# Patient Record
Sex: Female | Born: 1950 | Race: White | Hispanic: No | Marital: Married | Smoking: Current every day smoker
Health system: Southern US, Community
[De-identification: ages and names within clinical notes are randomized; demographics above are authoritative.]

## PROBLEM LIST (undated history)

## (undated) DIAGNOSIS — J449 Chronic obstructive pulmonary disease, unspecified: Secondary | ICD-10-CM

## (undated) DIAGNOSIS — C801 Malignant (primary) neoplasm, unspecified: Secondary | ICD-10-CM

## (undated) DIAGNOSIS — F32A Depression, unspecified: Secondary | ICD-10-CM

## (undated) DIAGNOSIS — G629 Polyneuropathy, unspecified: Secondary | ICD-10-CM

## (undated) DIAGNOSIS — J45909 Unspecified asthma, uncomplicated: Secondary | ICD-10-CM

## (undated) DIAGNOSIS — E119 Type 2 diabetes mellitus without complications: Secondary | ICD-10-CM

## (undated) DIAGNOSIS — I10 Essential (primary) hypertension: Secondary | ICD-10-CM

## (undated) DIAGNOSIS — C3490 Malignant neoplasm of unspecified part of unspecified bronchus or lung: Secondary | ICD-10-CM

## (undated) DIAGNOSIS — I2510 Atherosclerotic heart disease of native coronary artery without angina pectoris: Secondary | ICD-10-CM

## (undated) DIAGNOSIS — G2581 Restless legs syndrome: Secondary | ICD-10-CM

## (undated) DIAGNOSIS — E785 Hyperlipidemia, unspecified: Secondary | ICD-10-CM

## (undated) DIAGNOSIS — K219 Gastro-esophageal reflux disease without esophagitis: Secondary | ICD-10-CM

## (undated) DIAGNOSIS — F419 Anxiety disorder, unspecified: Secondary | ICD-10-CM

## (undated) DIAGNOSIS — R51 Headache: Secondary | ICD-10-CM

## (undated) DIAGNOSIS — R011 Cardiac murmur, unspecified: Secondary | ICD-10-CM

## (undated) DIAGNOSIS — R519 Headache, unspecified: Secondary | ICD-10-CM

## (undated) DIAGNOSIS — F329 Major depressive disorder, single episode, unspecified: Secondary | ICD-10-CM

## (undated) HISTORY — PX: DILATION AND CURETTAGE OF UTERUS: SHX78

## (undated) HISTORY — DX: Type 2 diabetes mellitus without complications: E11.9

## (undated) HISTORY — DX: Anxiety disorder, unspecified: F41.9

## (undated) HISTORY — PX: OTHER SURGICAL HISTORY: SHX169

## (undated) HISTORY — DX: Malignant neoplasm of unspecified part of unspecified bronchus or lung: C34.90

## (undated) HISTORY — PX: TUBAL LIGATION: SHX77

## (undated) HISTORY — DX: Headache: R51

## (undated) HISTORY — DX: Essential (primary) hypertension: I10

## (undated) HISTORY — DX: Major depressive disorder, single episode, unspecified: F32.9

## (undated) HISTORY — PX: CHOLECYSTECTOMY: SHX55

## (undated) HISTORY — DX: Chronic obstructive pulmonary disease, unspecified: J44.9

## (undated) HISTORY — DX: Unspecified asthma, uncomplicated: J45.909

## (undated) HISTORY — DX: Depression, unspecified: F32.A

## (undated) HISTORY — PX: THORACIC DISC SURGERY: SHX801

## (undated) HISTORY — PX: FOOT SURGERY: SHX648

## (undated) HISTORY — DX: Headache, unspecified: R51.9

---

## 1998-12-04 ENCOUNTER — Encounter: Payer: Self-pay | Admitting: Neurosurgery

## 1998-12-04 ENCOUNTER — Ambulatory Visit (HOSPITAL_COMMUNITY): Admission: RE | Admit: 1998-12-04 | Discharge: 1998-12-04 | Payer: Self-pay | Admitting: Neurosurgery

## 2002-03-12 ENCOUNTER — Encounter: Payer: Self-pay | Admitting: Neurosurgery

## 2002-03-17 ENCOUNTER — Encounter: Payer: Self-pay | Admitting: Neurosurgery

## 2002-03-17 ENCOUNTER — Ambulatory Visit (HOSPITAL_COMMUNITY): Admission: RE | Admit: 2002-03-17 | Discharge: 2002-03-18 | Payer: Self-pay | Admitting: Neurosurgery

## 2002-06-17 ENCOUNTER — Encounter: Payer: Self-pay | Admitting: Neurosurgery

## 2002-06-17 ENCOUNTER — Encounter: Admission: RE | Admit: 2002-06-17 | Discharge: 2002-06-17 | Payer: Self-pay | Admitting: Neurosurgery

## 2002-06-24 ENCOUNTER — Encounter: Payer: Self-pay | Admitting: Neurosurgery

## 2002-06-24 ENCOUNTER — Ambulatory Visit (HOSPITAL_COMMUNITY): Admission: RE | Admit: 2002-06-24 | Discharge: 2002-06-24 | Payer: Self-pay | Admitting: Neurosurgery

## 2003-10-16 HISTORY — PX: BACK SURGERY: SHX140

## 2005-08-06 ENCOUNTER — Ambulatory Visit: Payer: Self-pay | Admitting: Obstetrics and Gynecology

## 2006-01-23 ENCOUNTER — Ambulatory Visit: Payer: Self-pay | Admitting: Unknown Physician Specialty

## 2006-03-17 ENCOUNTER — Other Ambulatory Visit: Payer: Self-pay

## 2006-03-17 ENCOUNTER — Emergency Department: Payer: Self-pay | Admitting: Emergency Medicine

## 2006-04-15 ENCOUNTER — Emergency Department: Payer: Self-pay | Admitting: Emergency Medicine

## 2006-05-16 ENCOUNTER — Ambulatory Visit: Payer: Self-pay | Admitting: Unknown Physician Specialty

## 2006-05-17 ENCOUNTER — Ambulatory Visit: Payer: Self-pay | Admitting: Unknown Physician Specialty

## 2006-12-12 ENCOUNTER — Ambulatory Visit: Payer: Self-pay | Admitting: Anesthesiology

## 2007-01-15 ENCOUNTER — Ambulatory Visit: Payer: Self-pay | Admitting: Anesthesiology

## 2007-01-28 ENCOUNTER — Ambulatory Visit: Payer: Self-pay | Admitting: Unknown Physician Specialty

## 2007-03-04 ENCOUNTER — Ambulatory Visit: Payer: Self-pay | Admitting: Anesthesiology

## 2007-05-07 ENCOUNTER — Ambulatory Visit: Payer: Self-pay | Admitting: Anesthesiology

## 2007-07-02 ENCOUNTER — Ambulatory Visit: Payer: Self-pay | Admitting: Anesthesiology

## 2007-08-13 ENCOUNTER — Ambulatory Visit: Payer: Self-pay | Admitting: Anesthesiology

## 2007-08-28 ENCOUNTER — Ambulatory Visit: Payer: Self-pay | Admitting: Anesthesiology

## 2007-11-05 ENCOUNTER — Ambulatory Visit: Payer: Self-pay | Admitting: Anesthesiology

## 2007-12-10 ENCOUNTER — Ambulatory Visit: Payer: Self-pay | Admitting: Anesthesiology

## 2008-01-01 ENCOUNTER — Ambulatory Visit: Payer: Self-pay | Admitting: Anesthesiology

## 2008-02-03 ENCOUNTER — Ambulatory Visit: Payer: Self-pay | Admitting: Unknown Physician Specialty

## 2008-06-01 ENCOUNTER — Ambulatory Visit: Payer: Self-pay | Admitting: Specialist

## 2008-06-08 ENCOUNTER — Ambulatory Visit: Payer: Self-pay | Admitting: Unknown Physician Specialty

## 2008-07-06 ENCOUNTER — Ambulatory Visit: Payer: Self-pay | Admitting: Anesthesiology

## 2008-08-24 ENCOUNTER — Ambulatory Visit: Payer: Self-pay | Admitting: Anesthesiology

## 2008-10-04 ENCOUNTER — Ambulatory Visit: Payer: Self-pay | Admitting: Anesthesiology

## 2009-01-12 ENCOUNTER — Ambulatory Visit: Payer: Self-pay | Admitting: Unknown Physician Specialty

## 2009-01-22 ENCOUNTER — Emergency Department: Payer: Self-pay | Admitting: Unknown Physician Specialty

## 2009-01-30 ENCOUNTER — Inpatient Hospital Stay: Payer: Self-pay | Admitting: Internal Medicine

## 2009-03-28 ENCOUNTER — Ambulatory Visit: Payer: Self-pay | Admitting: Unknown Physician Specialty

## 2009-10-19 ENCOUNTER — Ambulatory Visit: Payer: Self-pay | Admitting: Unknown Physician Specialty

## 2010-07-24 ENCOUNTER — Other Ambulatory Visit: Payer: Self-pay | Admitting: Physician Assistant

## 2011-01-08 ENCOUNTER — Ambulatory Visit: Payer: Self-pay | Admitting: Unknown Physician Specialty

## 2011-02-15 ENCOUNTER — Ambulatory Visit: Payer: Self-pay | Admitting: Gastroenterology

## 2012-01-15 ENCOUNTER — Ambulatory Visit: Payer: Self-pay | Admitting: Unknown Physician Specialty

## 2012-08-01 ENCOUNTER — Ambulatory Visit: Payer: Self-pay | Admitting: Specialist

## 2013-03-13 ENCOUNTER — Ambulatory Visit: Payer: Self-pay | Admitting: Unknown Physician Specialty

## 2013-10-15 HISTORY — PX: CARDIAC CATHETERIZATION: SHX172

## 2014-03-18 ENCOUNTER — Ambulatory Visit: Payer: Self-pay | Admitting: Unknown Physician Specialty

## 2014-05-31 DIAGNOSIS — K219 Gastro-esophageal reflux disease without esophagitis: Secondary | ICD-10-CM | POA: Insufficient documentation

## 2014-06-08 DIAGNOSIS — J449 Chronic obstructive pulmonary disease, unspecified: Secondary | ICD-10-CM | POA: Insufficient documentation

## 2014-06-25 DIAGNOSIS — R0602 Shortness of breath: Secondary | ICD-10-CM | POA: Insufficient documentation

## 2014-06-25 DIAGNOSIS — R079 Chest pain, unspecified: Secondary | ICD-10-CM | POA: Insufficient documentation

## 2014-07-16 ENCOUNTER — Ambulatory Visit: Payer: Self-pay | Admitting: Internal Medicine

## 2014-07-16 LAB — CK TOTAL AND CKMB (NOT AT ARMC)
CK, TOTAL: 49 U/L
CK-MB: 0.9 ng/mL (ref 0.5–3.6)

## 2014-07-17 LAB — BASIC METABOLIC PANEL
ANION GAP: 5 — AB (ref 7–16)
BUN: 15 mg/dL (ref 7–18)
CALCIUM: 8.4 mg/dL — AB (ref 8.5–10.1)
Chloride: 103 mmol/L (ref 98–107)
Co2: 29 mmol/L (ref 21–32)
Creatinine: 0.91 mg/dL (ref 0.60–1.30)
EGFR (African American): >60
GLUCOSE: 78 mg/dL (ref 65–99)
OSMOLALITY: 274 (ref 275–301)
POTASSIUM: 4.2 mmol/L (ref 3.5–5.1)
SODIUM: 137 mmol/L (ref 136–145)

## 2014-08-02 DIAGNOSIS — I2510 Atherosclerotic heart disease of native coronary artery without angina pectoris: Secondary | ICD-10-CM | POA: Insufficient documentation

## 2014-10-04 DIAGNOSIS — R079 Chest pain, unspecified: Secondary | ICD-10-CM | POA: Insufficient documentation

## 2014-10-04 DIAGNOSIS — I2510 Atherosclerotic heart disease of native coronary artery without angina pectoris: Secondary | ICD-10-CM | POA: Insufficient documentation

## 2015-01-24 DIAGNOSIS — G4700 Insomnia, unspecified: Secondary | ICD-10-CM | POA: Insufficient documentation

## 2015-01-24 DIAGNOSIS — M797 Fibromyalgia: Secondary | ICD-10-CM | POA: Insufficient documentation

## 2015-01-24 DIAGNOSIS — E876 Hypokalemia: Secondary | ICD-10-CM | POA: Insufficient documentation

## 2015-01-24 DIAGNOSIS — Z8679 Personal history of other diseases of the circulatory system: Secondary | ICD-10-CM | POA: Insufficient documentation

## 2015-01-24 DIAGNOSIS — I2510 Atherosclerotic heart disease of native coronary artery without angina pectoris: Secondary | ICD-10-CM | POA: Insufficient documentation

## 2015-01-24 DIAGNOSIS — Z8639 Personal history of other endocrine, nutritional and metabolic disease: Secondary | ICD-10-CM | POA: Insufficient documentation

## 2015-01-24 DIAGNOSIS — J449 Chronic obstructive pulmonary disease, unspecified: Secondary | ICD-10-CM | POA: Insufficient documentation

## 2015-02-05 NOTE — Discharge Summary (Signed)
PATIENT NAME:  Suzanne, Glenn MR#:  [id number] DATE OF BIRTH:  07-13-1951  DATE OF ADMISSION:  07/16/2014 DATE OF DISCHARGE:    DISCHARGE DIAGNOSES:   1.  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  2.  Coronary artery disease.  3.  Hypertension.  4.  Hyperlipidemia.   HISTORY OF PRESENT ILLNESS: This is a 64 year old female with significant tobacco abuse, hypertension, hyperlipidemia, having progressive symptoms of Canadian class III angina with high risk stress test showing anterior lateral myocardial perfusion defect and myocardial ischemia with progressive symptoms, on appropriate medications for hypertension, hyperlipidemia, using nitrates as well with continued progression of symptoms. The patient underwent cardiac catheterization showing normal LV systolic function with ejection fraction of 60% with a 95% left anterior descending artery lesion, soft plaque, and underwent a PCI and drug-eluting stent of that stenosis without complication. The patient was ambulating well on appropriate medication management and had reached her maximal hospital benefit and therefore was ready for discharge to home.    DISCHARGE MEDICATIONS:  Amlodipine 10 mg p.o. daily, trazodone 150 mg p.o. daily, Pravachol 40 mg p.o. daily, spironolactone 50 mg p.o. daily, Lopressor 50 mg p.o. daily, metformin 500 mg p.o. daily, Plavix 75 mg p.o. daily, aspirin 81 mg p.o. daily, Protonix 40 mg p.o. daily, and inhalers.   FOLLOWUP:  She is to follow up in 2 weeks and call if any other significant issues.    ____________________________ Corey Skains, MD [id number] D: 07/16/2014 12:32:55 ET T: 07/16/2014 14:39:56 ET JOB#: [id number]  cc: Corey Skains, MD, <Dictator> Corey Skains MD ELECTRONICALLY SIGNED 07/19/2014 13:24

## 2015-02-09 DIAGNOSIS — E1169 Type 2 diabetes mellitus with other specified complication: Secondary | ICD-10-CM | POA: Insufficient documentation

## 2015-02-09 DIAGNOSIS — E119 Type 2 diabetes mellitus without complications: Secondary | ICD-10-CM | POA: Insufficient documentation

## 2015-04-05 DIAGNOSIS — I10 Essential (primary) hypertension: Secondary | ICD-10-CM | POA: Insufficient documentation

## 2015-04-20 DIAGNOSIS — J449 Chronic obstructive pulmonary disease, unspecified: Secondary | ICD-10-CM | POA: Diagnosis not present

## 2015-04-20 DIAGNOSIS — F17210 Nicotine dependence, cigarettes, uncomplicated: Secondary | ICD-10-CM | POA: Diagnosis not present

## 2015-04-20 DIAGNOSIS — R0902 Hypoxemia: Secondary | ICD-10-CM | POA: Diagnosis not present

## 2015-05-05 ENCOUNTER — Ambulatory Visit (INDEPENDENT_AMBULATORY_CARE_PROVIDER_SITE_OTHER): Payer: Medicare HMO | Admitting: Psychiatry

## 2015-05-05 ENCOUNTER — Encounter: Payer: Self-pay | Admitting: Psychiatry

## 2015-05-05 VITALS — BP 118/72 | HR 84 | Temp 97.5°F | Ht 63.0 in | Wt 184.6 lb

## 2015-05-05 DIAGNOSIS — F32A Depression, unspecified: Secondary | ICD-10-CM | POA: Insufficient documentation

## 2015-05-05 DIAGNOSIS — F331 Major depressive disorder, recurrent, moderate: Secondary | ICD-10-CM | POA: Diagnosis not present

## 2015-05-05 DIAGNOSIS — F329 Major depressive disorder, single episode, unspecified: Secondary | ICD-10-CM | POA: Insufficient documentation

## 2015-05-05 DIAGNOSIS — F411 Generalized anxiety disorder: Secondary | ICD-10-CM | POA: Diagnosis not present

## 2015-05-05 MED ORDER — VENLAFAXINE HCL ER 150 MG PO CP24: 150.0000 mg | ORAL_CAPSULE | Freq: Every day | ORAL | Status: DC

## 2015-05-05 MED ORDER — ALPRAZOLAM 0.5 MG PO TABS: 0.5000 mg | ORAL_TABLET | Freq: Two times a day (BID) | ORAL | Status: DC

## 2015-05-05 MED ORDER — TRAZODONE HCL 150 MG PO TABS: 150.0000 mg | ORAL_TABLET | Freq: Every day | ORAL | Status: DC

## 2015-05-05 NOTE — Progress Notes (Signed)
BH MD/PA/NP OP Progress Note  05/05/2015 9:37 AM Suzanne Glenn  MRN:  [id number]  Subjective:    She is a 64 year old female who presented for follow-up appointment. She reported that she is compliant with her medications and has been taking them as prescribed. She reported that her mood symptoms are improving and she does not have any anger anxiety or paranoia. She currently smokes 1 pack of cigarettes per day. She is concerned about her son as he might be having some conflict with his wife as her 45 year old grandson reported to her. She stated that they are now seeking counseling   Patient reported that she does not have any worsening of her anxiety symptoms and the venlafaxine is helping her and she is interested in having her medications adjusted at this time.   Chief Complaint:  Chief Complaint    Follow-up; Medication Refill; Anxiety; Depression     Visit Diagnosis:     ICD-9-CM ICD-10-CM   1. MDD (major depressive disorder), recurrent episode, moderate 296.32 F33.1   2. GAD (generalized anxiety disorder) 300.02 F41.1     Past Medical History:  Past Medical History  Diagnosis Date  . Anxiety   . Depression   . Diabetes mellitus, type II   . Hypertension   . COPD (chronic obstructive pulmonary disease)   . Asthma   . Headache     Past Surgical History  Procedure Laterality Date  . Cervical bone infusion    . Cholecystectomy    . Back surgery    . Tubal ligation     Family History:  Family History  Problem Relation Age of Onset  . Anxiety disorder Mother   . Cancer - Lung Mother   . Depression Sister   . Cancer Sister   . Depression Brother   . Cancer Sister   . Cancer Sister   . Cancer Sister   . Heart Problems Sister   . Bipolar disorder Sister   . Diabetes Sister   . Cancer - Lung Sister   . Hypertension Sister   . Diabetes Sister   . Hyperlipidemia Sister   . COPD Sister   . Heart Problems Brother   . Cancer Brother   . Cancer Brother   .  Cancer Brother    Social History:  History   Social History  . Marital Status: Married    Spouse Name: N/A  . Number of Children: N/A  . Years of Education: N/A   Social History Main Topics  . Smoking status: Current Every Day Smoker -- 1.00 packs/day for 40 years    Types: Cigarettes    Start date: 05/05/1975  . Smokeless tobacco: Never Used  . Alcohol Use: No     Comment: socially  . Drug Use: No  . Sexual Activity: No   Other Topics Concern  . None   Social History Narrative   Additional History:   She currently lives with her husband. She reported that they live on the same property as her son and his family  Assessment:   Musculoskeletal: Strength & Muscle Tone: within normal limits Gait & Station: normal Patient leans: N/A  Psychiatric Specialty Exam: HPI  Review of Systems  Respiratory: Positive for cough.   Musculoskeletal: Positive for back pain.  Psychiatric/Behavioral: Positive for depression. The patient is nervous/anxious.   All other systems reviewed and are negative.   Blood pressure 118/72, pulse 84, temperature 97.5 F (36.4 C), temperature source Tympanic, height '5\' 3"'$  (1.6 m),  weight 184 lb 9.6 oz (83.734 kg), SpO2 91 %.Body mass index is 32.71 kg/(m^2).  General Appearance: Casual  Eye Contact:  Fair  Speech:  Clear and Coherent  Volume:  Normal  Mood:  Anxious  Affect:  Congruent  Thought Process:  Coherent  Orientation:  Full (Time, Place, and Person)  Thought Content:  WDL  Suicidal Thoughts:  No  Homicidal Thoughts:  No  Memory:  Immediate;   Fair  Judgement:  Fair  Insight:  Fair  Psychomotor Activity:  Normal  Concentration:  Fair  Recall:  [employer] of Knowledge: Fair  Language: Fair  Akathisia:  No  Handed:  Right  AIMS (if indicated):    Assets:  Communication Skills Housing Physical Health Social Support  ADL's:  Intact  Cognition: WNL  Sleep:  8-9    Is the patient at risk to self?  No. Has the patient been  a risk to self in the past 6 months?  No. Has the patient been a risk to self within the distant past?  No. Is the patient a risk to others?  No. Has the patient been a risk to others in the past 6 months?  No. Has the patient been a risk to others within the distant past?  No.  Current Medications: Current Outpatient Prescriptions  Medication Sig Dispense Refill  . ACCU-CHEK FASTCLIX LANCETS MISC     . ACCU-CHEK SMARTVIEW test strip     . albuterol (PROVENTIL HFA) 108 (90 BASE) MCG/ACT inhaler Inhale into the lungs.    . ALPRAZolam (XANAX) 1 MG tablet Take 1 tablet by mouth 2 (two) times daily.    Marland Kitchen amLODipine (NORVASC) 10 MG tablet Take 1 tablet by mouth daily.    Marland Kitchen aspirin 81 MG chewable tablet Chew 1 tablet by mouth daily.    . clopidogrel (PLAVIX) 75 MG tablet Take 1 tablet by mouth daily.    Marland Kitchen esomeprazole (NEXIUM) 40 MG capsule Take 1 capsule by mouth daily.    . fluticasone-salmeterol (ADVAIR HFA) 115-21 MCG/ACT inhaler Inhale into the lungs.    . metFORMIN (GLUCOPHAGE) 500 MG tablet Take 1 tablet by mouth every morning.    . metoprolol tartrate (LOPRESSOR) 25 MG tablet Take 1 tablet by mouth 2 (two) times daily.    . pantoprazole (PROTONIX) 40 MG tablet     . potassium chloride (KLOR-CON) 8 MEQ tablet Take 1 tablet by mouth daily.    . pravastatin (PRAVACHOL) 80 MG tablet Take 1 tablet by mouth every evening.    Marland Kitchen spironolactone (ALDACTONE) 50 MG tablet Take 1 tablet by mouth daily.    Marland Kitchen tiotropium (SPIRIVA) 18 MCG inhalation capsule Place 1 mcg into inhaler and inhale daily.    . traZODone (DESYREL) 150 MG tablet Take 1 tablet by mouth at bedtime.    Marland Kitchen venlafaxine XR (EFFEXOR-XR) 150 MG 24 hr capsule     . venlafaxine (EFFEXOR) 75 MG tablet Take 1 tablet by mouth 2 (two) times daily.     No current facility-administered medications for this visit.    Medical Decision Making:  Established Problem, Stable/Improving (1), Review of Psycho-Social Stressors (1) and Review of Last  Therapy Session (1)  Treatment Plan Summary:Medication management  Discussed with patient about the medications and I will decrease the dose of Xanax 0.5 mg by mouth twice a day as she is not experiencing any anxiety symptoms at this time. Patient agreed with the plan. She will continue on venlafaxine 150 mg in the  morning and trazodone 150 mg at bedtime. She will follow-up in 2 months or earlier depending on her symptoms.    More than 50% of the time spent in psychoeducation, counseling and coordination of care.    This note was generated in part or whole with voice recognition software. Voice regonition is usually quite accurate but there are transcription errors that can and very often do occur. I apologize for any typographical errors that were not detected and corrected.    Rainey Pines 05/05/2015, 9:37 AM

## 2015-05-11 DIAGNOSIS — J449 Chronic obstructive pulmonary disease, unspecified: Secondary | ICD-10-CM | POA: Diagnosis not present

## 2015-05-21 DIAGNOSIS — J449 Chronic obstructive pulmonary disease, unspecified: Secondary | ICD-10-CM | POA: Diagnosis not present

## 2015-05-23 ENCOUNTER — Other Ambulatory Visit: Payer: Self-pay | Admitting: Internal Medicine

## 2015-05-23 DIAGNOSIS — Z124 Encounter for screening for malignant neoplasm of cervix: Secondary | ICD-10-CM | POA: Diagnosis not present

## 2015-05-23 DIAGNOSIS — K219 Gastro-esophageal reflux disease without esophagitis: Secondary | ICD-10-CM | POA: Diagnosis not present

## 2015-05-23 DIAGNOSIS — Z0000 Encounter for general adult medical examination without abnormal findings: Secondary | ICD-10-CM | POA: Diagnosis not present

## 2015-05-23 DIAGNOSIS — E119 Type 2 diabetes mellitus without complications: Secondary | ICD-10-CM | POA: Diagnosis not present

## 2015-05-23 DIAGNOSIS — Z1239 Encounter for other screening for malignant neoplasm of breast: Secondary | ICD-10-CM | POA: Diagnosis not present

## 2015-05-23 DIAGNOSIS — Z1231 Encounter for screening mammogram for malignant neoplasm of breast: Secondary | ICD-10-CM

## 2015-05-27 ENCOUNTER — Ambulatory Visit
Admission: RE | Admit: 2015-05-27 | Discharge: 2015-05-27 | Disposition: A | Discharge status: Home / Self Care | Payer: Commercial Managed Care - HMO | Source: Ambulatory Visit | Attending: Internal Medicine | Admitting: Internal Medicine

## 2015-05-27 DIAGNOSIS — Z1231 Encounter for screening mammogram for malignant neoplasm of breast: Secondary | ICD-10-CM

## 2015-06-11 DIAGNOSIS — J449 Chronic obstructive pulmonary disease, unspecified: Secondary | ICD-10-CM | POA: Diagnosis not present

## 2015-06-21 DIAGNOSIS — J449 Chronic obstructive pulmonary disease, unspecified: Secondary | ICD-10-CM | POA: Diagnosis not present

## 2015-06-30 ENCOUNTER — Encounter: Payer: Self-pay | Admitting: Psychiatry

## 2015-06-30 ENCOUNTER — Ambulatory Visit (INDEPENDENT_AMBULATORY_CARE_PROVIDER_SITE_OTHER): Payer: Medicare HMO | Admitting: Psychiatry

## 2015-06-30 VITALS — BP 122/78 | HR 88 | Temp 97.9°F | Ht 63.0 in | Wt 184.0 lb

## 2015-06-30 DIAGNOSIS — F331 Major depressive disorder, recurrent, moderate: Secondary | ICD-10-CM | POA: Diagnosis not present

## 2015-06-30 DIAGNOSIS — E782 Mixed hyperlipidemia: Secondary | ICD-10-CM | POA: Insufficient documentation

## 2015-06-30 MED ORDER — TRAZODONE HCL 150 MG PO TABS: 150.0000 mg | ORAL_TABLET | Freq: Every day | ORAL | Status: DC

## 2015-06-30 MED ORDER — VENLAFAXINE HCL ER 150 MG PO CP24: 150.0000 mg | ORAL_CAPSULE | Freq: Every day | ORAL | Status: DC

## 2015-06-30 MED ORDER — ALPRAZOLAM 0.5 MG PO TABS: 0.5000 mg | ORAL_TABLET | Freq: Two times a day (BID) | ORAL | Status: DC

## 2015-06-30 NOTE — Progress Notes (Signed)
BH MD/PA/NP OP Progress Note  06/30/2015 8:54 AM Suzanne Glenn  MRN:  [id number]  Subjective:    She is a 64 year old female who presented for follow-up appointment. She reported that she is compliant with her medications and has been taking them as prescribed. She reported that her mood symptoms are improving and she does not have any anger anxiety or paranoia. She currently smokes 1 pack of cigarettes per day. She reported that she responded well to the change in the Xanax initially she felt anxious for the couple of days but now she is feeling more alert oriented and feels that her more symptoms are improving. She is not having any anxiety symptoms at this time. She usually wakes up in the morning and spends time with her husband and family members. Patient currently denied having any withdrawal symptoms. Patient reported that the current combination of medications is helping her. She reported that she sleeps well with the help of trazodone.   Patient denied having any suicidal ideations or plans.  Chief Complaint:  Chief Complaint    Follow-up; Medication Refill     Visit Diagnosis:   No diagnosis found.  Past Medical History:  Past Medical History  Diagnosis Date  . Anxiety   . Depression   . Diabetes mellitus, type II   . Hypertension   . COPD (chronic obstructive pulmonary disease)   . Asthma   . Headache     Past Surgical History  Procedure Laterality Date  . Cervical bone infusion    . Cholecystectomy    . Back surgery    . Tubal ligation     Family History:  Family History  Problem Relation Age of Onset  . Anxiety disorder Mother   . Cancer - Lung Mother   . Depression Sister   . Cancer Sister   . Depression Brother   . Cancer Sister   . Cancer Sister   . Cancer Sister   . Heart Problems Sister   . Bipolar disorder Sister   . Diabetes Sister   . Cancer - Lung Sister   . Hypertension Sister   . Diabetes Sister   . Hyperlipidemia Sister   . COPD  Sister   . Heart Problems Brother   . Cancer Brother   . Cancer Brother   . Cancer Brother   . Breast cancer Maternal Aunt    Social History:  Social History   Social History  . Marital Status: Married    Spouse Name: N/A  . Number of Children: N/A  . Years of Education: N/A   Social History Main Topics  . Smoking status: Current Every Day Smoker -- 1.00 packs/day for 40 years    Types: Cigarettes    Start date: 05/05/1975  . Smokeless tobacco: Never Used  . Alcohol Use: No     Comment: socially  . Drug Use: No  . Sexual Activity: No   Other Topics Concern  . None   Social History Narrative   Additional History:   She currently lives with her husband. She reported that they live on the same property as her son and his family  Assessment:   Musculoskeletal: Strength & Muscle Tone: within normal limits Gait & Station: normal Patient leans: N/A  Psychiatric Specialty Exam: HPI   Review of Systems  Respiratory: Positive for cough.   Gastrointestinal: Positive for abdominal pain, diarrhea and constipation.  Musculoskeletal: Positive for back pain.  Psychiatric/Behavioral: Negative for depression. The patient is nervous/anxious.  All other systems reviewed and are negative.   Blood pressure 122/78, pulse 88, temperature 97.9 F (36.6 C), temperature source Tympanic, height '5\' 3"'$  (1.6 m), weight 184 lb (83.462 kg), SpO2 92 %.Body mass index is 32.6 kg/(m^2).  General Appearance: Casual  Eye Contact:  Fair  Speech:  Clear and Coherent  Volume:  Normal  Mood:  Euthymic  Affect:  Congruent  Thought Process:  Coherent  Orientation:  Full (Time, Place, and Person)  Thought Content:  WDL  Suicidal Thoughts:  No  Homicidal Thoughts:  No  Memory:  Immediate;   Fair  Judgement:  Fair  Insight:  Fair  Psychomotor Activity:  Normal  Concentration:  Fair  Recall:  [employer] of Knowledge: Fair  Language: Fair  Akathisia:  No  Handed:  Right  AIMS (if  indicated):    Assets:  Communication Skills Housing Physical Health Social Support  ADL's:  Intact  Cognition: WNL  Sleep:  8-9    Is the patient at risk to self?  No. Has the patient been a risk to self in the past 6 months?  No. Has the patient been a risk to self within the distant past?  No. Is the patient a risk to others?  No. Has the patient been a risk to others in the past 6 months?  No. Has the patient been a risk to others within the distant past?  No.  Current Medications: Current Outpatient Prescriptions  Medication Sig Dispense Refill  . ACCU-CHEK FASTCLIX LANCETS MISC     . ACCU-CHEK SMARTVIEW test strip     . albuterol (PROVENTIL HFA) 108 (90 BASE) MCG/ACT inhaler Inhale into the lungs.    . ALPRAZolam (XANAX) 0.5 MG tablet Take 1 tablet (0.5 mg total) by mouth 2 (two) times daily. 60 tablet 2  . amLODipine (NORVASC) 10 MG tablet Take 1 tablet by mouth daily.    Marland Kitchen aspirin 81 MG chewable tablet Chew 1 tablet by mouth daily.    . clopidogrel (PLAVIX) 75 MG tablet Take 1 tablet by mouth daily.    . fluticasone-salmeterol (ADVAIR HFA) 115-21 MCG/ACT inhaler Inhale into the lungs.    . metFORMIN (GLUCOPHAGE) 500 MG tablet Take 1 tablet by mouth every morning.    . metoprolol tartrate (LOPRESSOR) 25 MG tablet Take 1 tablet by mouth 2 (two) times daily.    . pantoprazole (PROTONIX) 40 MG tablet     . potassium chloride (KLOR-CON) 8 MEQ tablet Take 1 tablet by mouth daily.    . pravastatin (PRAVACHOL) 80 MG tablet Take 1 tablet by mouth every evening.    Marland Kitchen spironolactone (ALDACTONE) 50 MG tablet Take 1 tablet by mouth daily.    Marland Kitchen tiotropium (SPIRIVA) 18 MCG inhalation capsule Place 1 mcg into inhaler and inhale daily.    . traZODone (DESYREL) 150 MG tablet Take 1 tablet (150 mg total) by mouth at bedtime. 30 tablet 2  . venlafaxine XR (EFFEXOR-XR) 150 MG 24 hr capsule Take 1 capsule (150 mg total) by mouth daily with breakfast. 30 capsule 2   No current  facility-administered medications for this visit.    Medical Decision Making:  Established Problem, Stable/Improving (1), Review of Psycho-Social Stressors (1) and Review of Last Therapy Session (1)  Treatment Plan Summary:Medication management   Depression Continue with venlafaxine 150 ng in the morning  Anxiety Continue with Xanax 0.5 mg by mouth twice a day  Insomnia Continue trazodone as prescribed    More than 50% of  the time spent in psychoeducation, counseling and coordination of care.  Time spent with the patient 25 minutes   This note was generated in part or whole with voice recognition software. Voice regonition is usually quite accurate but there are transcription errors that can and very often do occur. I apologize for any typographical errors that were not detected and corrected.    Rainey Pines 06/30/2015, 8:54 AM

## 2015-07-12 DIAGNOSIS — J449 Chronic obstructive pulmonary disease, unspecified: Secondary | ICD-10-CM | POA: Diagnosis not present

## 2015-07-21 DIAGNOSIS — J449 Chronic obstructive pulmonary disease, unspecified: Secondary | ICD-10-CM | POA: Diagnosis not present

## 2015-08-03 DIAGNOSIS — B0230 Zoster ocular disease, unspecified: Secondary | ICD-10-CM | POA: Diagnosis not present

## 2015-08-11 DIAGNOSIS — J449 Chronic obstructive pulmonary disease, unspecified: Secondary | ICD-10-CM | POA: Diagnosis not present

## 2015-08-21 DIAGNOSIS — J449 Chronic obstructive pulmonary disease, unspecified: Secondary | ICD-10-CM | POA: Diagnosis not present

## 2015-08-22 DIAGNOSIS — E119 Type 2 diabetes mellitus without complications: Secondary | ICD-10-CM | POA: Diagnosis not present

## 2015-08-29 DIAGNOSIS — K219 Gastro-esophageal reflux disease without esophagitis: Secondary | ICD-10-CM | POA: Diagnosis not present

## 2015-08-29 DIAGNOSIS — E119 Type 2 diabetes mellitus without complications: Secondary | ICD-10-CM | POA: Diagnosis not present

## 2015-08-29 DIAGNOSIS — I10 Essential (primary) hypertension: Secondary | ICD-10-CM | POA: Diagnosis not present

## 2015-08-29 DIAGNOSIS — Z23 Encounter for immunization: Secondary | ICD-10-CM | POA: Diagnosis not present

## 2015-08-29 DIAGNOSIS — E782 Mixed hyperlipidemia: Secondary | ICD-10-CM | POA: Diagnosis not present

## 2015-09-11 DIAGNOSIS — J449 Chronic obstructive pulmonary disease, unspecified: Secondary | ICD-10-CM | POA: Diagnosis not present

## 2015-09-20 DIAGNOSIS — J449 Chronic obstructive pulmonary disease, unspecified: Secondary | ICD-10-CM | POA: Diagnosis not present

## 2015-09-29 ENCOUNTER — Ambulatory Visit: Payer: Commercial Managed Care - HMO | Admitting: Psychiatry

## 2015-10-11 DIAGNOSIS — J449 Chronic obstructive pulmonary disease, unspecified: Secondary | ICD-10-CM | POA: Diagnosis not present

## 2015-10-21 DIAGNOSIS — J449 Chronic obstructive pulmonary disease, unspecified: Secondary | ICD-10-CM | POA: Diagnosis not present

## 2015-11-03 ENCOUNTER — Encounter: Payer: Self-pay | Admitting: Psychiatry

## 2015-11-03 ENCOUNTER — Ambulatory Visit (INDEPENDENT_AMBULATORY_CARE_PROVIDER_SITE_OTHER): Payer: Commercial Managed Care - HMO | Admitting: Psychiatry

## 2015-11-03 VITALS — BP 120/72 | HR 90 | Temp 98.5°F | Ht 63.0 in | Wt 185.0 lb

## 2015-11-03 DIAGNOSIS — F331 Major depressive disorder, recurrent, moderate: Secondary | ICD-10-CM | POA: Diagnosis not present

## 2015-11-03 DIAGNOSIS — Z634 Disappearance and death of family member: Secondary | ICD-10-CM | POA: Diagnosis not present

## 2015-11-03 MED ORDER — QUETIAPINE FUMARATE 25 MG PO TABS: 25.0000 mg | ORAL_TABLET | Freq: Every day | ORAL | Status: DC

## 2015-11-03 MED ORDER — VENLAFAXINE HCL ER 150 MG PO CP24: 150.0000 mg | ORAL_CAPSULE | Freq: Every day | ORAL | Status: DC

## 2015-11-03 MED ORDER — ALPRAZOLAM 0.5 MG PO TABS: 0.5000 mg | ORAL_TABLET | Freq: Two times a day (BID) | ORAL | Status: DC

## 2015-11-03 NOTE — Progress Notes (Signed)
BH MD/PA/NP OP Progress Note  11/03/2015 9:57 AM Suzanne Glenn  MRN:  [id number]  Subjective:    Pt  is a 65 year old female who presented for follow-up appointment. She reported that she has been feeling depressed as she has the death of her sister as well as sister-in-law in November and December respectively. Patient reported that they had very quiet holidays. Patient reported that she has been compliant with her medications and is unable to sleep well at night. She will toss and turn at every hour. She has been taking trazodone and Xanax on a regular basis. However she does not sleep well and is willing to have her medications adjusted. Patient currently denied having any suicidal ideations or plans. She reported that she has taken Seroquel in the past but does not remember the reaction to the medication and is willing to restart the medication at the lower dose. She has good relationship with her husband but he is feeling sad after the death of his sister as well. Patient reported that she is spending time with her family now. She denied having any mood swings anger anxiety or paranoia  Patient denied having any suicidal ideations or plans.  Chief Complaint:  Chief Complaint    Follow-up; Medication Refill     Visit Diagnosis:     ICD-9-CM ICD-10-CM   1. MDD (major depressive disorder), recurrent episode, moderate (HCC) 296.32 F33.1   2. Bereavement V62.82 Z63.4     Past Medical History:  Past Medical History  Diagnosis Date  . Anxiety   . Depression   . Diabetes mellitus, type II ([place])   . Hypertension   . COPD (chronic obstructive pulmonary disease) ([place])   . Asthma   . Headache     Past Surgical History  Procedure Laterality Date  . Cervical bone infusion    . Cholecystectomy    . Back surgery    . Tubal ligation     Family History:  Family History  Problem Relation Age of Onset  . Anxiety disorder Mother   . Cancer - Lung Mother   . Depression Sister   .  Cancer Sister   . Depression Brother   . Cancer Sister   . Cancer Sister   . Cancer Sister   . Heart Problems Sister   . Bipolar disorder Sister   . Diabetes Sister   . Cancer - Lung Sister   . Hypertension Sister   . Diabetes Sister   . Hyperlipidemia Sister   . COPD Sister   . Heart Problems Brother   . Cancer Brother   . Cancer Brother   . Cancer Brother   . Breast cancer Maternal Aunt    Social History:  Social History   Social History  . Marital Status: Married    Spouse Name: N/A  . Number of Children: N/A  . Years of Education: N/A   Social History Main Topics  . Smoking status: Current Every Day Smoker -- 1.00 packs/day for 40 years    Types: Cigarettes    Start date: 05/05/1975  . Smokeless tobacco: Never Used  . Alcohol Use: No     Comment: socially  . Drug Use: No  . Sexual Activity: No   Other Topics Concern  . None   Social History Narrative   Additional History:   She currently lives with her husband. She reported that they live on the same property as her son and his family  Assessment:   Musculoskeletal: Strength &  Muscle Tone: within normal limits Gait & Station: normal Patient leans: N/A  Psychiatric Specialty Exam: HPI   Review of Systems  Respiratory: Positive for cough.   Gastrointestinal: Positive for abdominal pain, diarrhea and constipation.  Musculoskeletal: Positive for back pain.  Psychiatric/Behavioral: Negative for depression. The patient is nervous/anxious.   All other systems reviewed and are negative.   Blood pressure 120/72, pulse 90, temperature 98.5 F (36.9 C), temperature source Tympanic, height '5\' 3"'$  (1.6 m), weight 185 lb (83.915 kg), SpO2 88 %.Body mass index is 32.78 kg/(m^2).  General Appearance: Casual  Eye Contact:  Fair  Speech:  Clear and Coherent  Volume:  Normal  Mood:  Euthymic  Affect:  Congruent  Thought Process:  Coherent  Orientation:  Full (Time, Place, and Person)  Thought Content:  WDL   Suicidal Thoughts:  No  Homicidal Thoughts:  No  Memory:  Immediate;   Fair  Judgement:  Fair  Insight:  Fair  Psychomotor Activity:  Normal  Concentration:  Fair  Recall:  [employer] of Knowledge: Fair  Language: Fair  Akathisia:  No  Handed:  Right  AIMS (if indicated):    Assets:  Communication Skills Housing Physical Health Social Support  ADL's:  Intact  Cognition: WNL  Sleep:  8-9    Is the patient at risk to self?  No. Has the patient been a risk to self in the past 6 months?  No. Has the patient been a risk to self within the distant past?  No. Is the patient a risk to others?  No. Has the patient been a risk to others in the past 6 months?  No. Has the patient been a risk to others within the distant past?  No.  Current Medications: Current Outpatient Prescriptions  Medication Sig Dispense Refill  . ACCU-CHEK FASTCLIX LANCETS MISC     . ACCU-CHEK SMARTVIEW test strip     . albuterol (PROVENTIL HFA) 108 (90 BASE) MCG/ACT inhaler Inhale into the lungs.    . ALPRAZolam (XANAX) 0.5 MG tablet Take 1 tablet (0.5 mg total) by mouth 2 (two) times daily. 60 tablet 2  . amLODipine (NORVASC) 10 MG tablet Take 1 tablet by mouth daily.    Marland Kitchen aspirin 81 MG chewable tablet Chew 1 tablet by mouth daily.    . clopidogrel (PLAVIX) 75 MG tablet Take 1 tablet by mouth daily.    . fluticasone-salmeterol (ADVAIR HFA) 115-21 MCG/ACT inhaler Inhale into the lungs.    . metFORMIN (GLUCOPHAGE) 500 MG tablet Take 1 tablet by mouth every morning.    . metoprolol tartrate (LOPRESSOR) 25 MG tablet Take 1 tablet by mouth 2 (two) times daily.    . pantoprazole (PROTONIX) 40 MG tablet     . potassium chloride (KLOR-CON) 8 MEQ tablet Take 1 tablet by mouth daily.    . pravastatin (PRAVACHOL) 80 MG tablet Take 1 tablet by mouth every evening.    Marland Kitchen spironolactone (ALDACTONE) 50 MG tablet Take 1 tablet by mouth daily.    Marland Kitchen tiotropium (SPIRIVA) 18 MCG inhalation capsule Place 1 mcg into inhaler and  inhale daily.    . traZODone (DESYREL) 150 MG tablet Take 1 tablet (150 mg total) by mouth at bedtime. 30 tablet 2  . venlafaxine XR (EFFEXOR-XR) 150 MG 24 hr capsule Take 1 capsule (150 mg total) by mouth daily with breakfast. 30 capsule 2   No current facility-administered medications for this visit.    Medical Decision Making:  Established Problem, Stable/Improving (  1), Review of Psycho-Social Stressors (1) and Review of Last Therapy Session (1)  Treatment Plan Summary:Medication management   Depression Continue with venlafaxine 150 ng in the morning  Anxiety Continue with Xanax 0.5 mg by mouth twice a day  Insomnia I will start her on Seroquel 25 mg at bedtime. Advised patient to start taking 2 pills and she is not sleeping well and she demonstrated understanding.    This note was generated in part or whole with voice recognition software. Voice regonition is usually quite accurate but there are transcription errors that can and very often do occur. I apologize for any typographical errors that were not detected and corrected.   Rainey Pines, MD    11/03/2015, 9:57 AM

## 2015-11-11 DIAGNOSIS — J449 Chronic obstructive pulmonary disease, unspecified: Secondary | ICD-10-CM | POA: Diagnosis not present

## 2015-11-16 ENCOUNTER — Telehealth: Payer: Self-pay | Admitting: Psychiatry

## 2015-11-21 DIAGNOSIS — J449 Chronic obstructive pulmonary disease, unspecified: Secondary | ICD-10-CM | POA: Diagnosis not present

## 2015-11-24 MED ORDER — TRAZODONE HCL 100 MG PO TABS: 50.0000 mg | ORAL_TABLET | Freq: Every day | ORAL | Status: DC

## 2015-11-24 NOTE — Telephone Encounter (Signed)
Will reorder Trazodone '100mg'$  po qhs x 1 refill.

## 2015-11-28 DIAGNOSIS — R0902 Hypoxemia: Secondary | ICD-10-CM | POA: Diagnosis not present

## 2015-11-28 DIAGNOSIS — R0609 Other forms of dyspnea: Secondary | ICD-10-CM | POA: Diagnosis not present

## 2015-11-28 DIAGNOSIS — J449 Chronic obstructive pulmonary disease, unspecified: Secondary | ICD-10-CM | POA: Diagnosis not present

## 2015-12-12 DIAGNOSIS — J449 Chronic obstructive pulmonary disease, unspecified: Secondary | ICD-10-CM | POA: Diagnosis not present

## 2015-12-19 DIAGNOSIS — J449 Chronic obstructive pulmonary disease, unspecified: Secondary | ICD-10-CM | POA: Diagnosis not present

## 2015-12-26 DIAGNOSIS — E782 Mixed hyperlipidemia: Secondary | ICD-10-CM | POA: Diagnosis not present

## 2015-12-26 DIAGNOSIS — I2510 Atherosclerotic heart disease of native coronary artery without angina pectoris: Secondary | ICD-10-CM | POA: Diagnosis not present

## 2015-12-26 DIAGNOSIS — I10 Essential (primary) hypertension: Secondary | ICD-10-CM | POA: Diagnosis not present

## 2015-12-29 ENCOUNTER — Encounter: Payer: Self-pay | Admitting: Psychiatry

## 2015-12-29 ENCOUNTER — Ambulatory Visit (INDEPENDENT_AMBULATORY_CARE_PROVIDER_SITE_OTHER): Payer: Commercial Managed Care - HMO | Admitting: Psychiatry

## 2015-12-29 VITALS — BP 120/70 | HR 81 | Temp 98.9°F | Ht 63.0 in | Wt 184.6 lb

## 2015-12-29 DIAGNOSIS — F411 Generalized anxiety disorder: Secondary | ICD-10-CM

## 2015-12-29 DIAGNOSIS — F331 Major depressive disorder, recurrent, moderate: Secondary | ICD-10-CM

## 2015-12-29 MED ORDER — VENLAFAXINE HCL ER 150 MG PO CP24: 150.0000 mg | ORAL_CAPSULE | Freq: Every day | ORAL | Status: DC

## 2015-12-29 MED ORDER — ALPRAZOLAM 0.5 MG PO TABS: 0.5000 mg | ORAL_TABLET | Freq: Two times a day (BID) | ORAL | Status: DC

## 2015-12-29 MED ORDER — TRAZODONE HCL 100 MG PO TABS: 100.0000 mg | ORAL_TABLET | Freq: Every day | ORAL | Status: DC

## 2015-12-29 NOTE — Progress Notes (Signed)
BH MD/PA/NP OP Progress Note  12/29/2015 9:38 AM Suzanne Glenn  MRN:  [id number]  Subjective:    Pt  is a 65 year old female who presented for follow-up appointment. She reported that she has not been able to sleep as she did not do well on the Seroquel and when she called she was given a prescription of trazodone. She reported that she responded well to the trazodone but ran out of her medication 2 days ago. Patient reported that she is doing well on her current medications including venlafaxine and alprazolam. Patient currently denied having depression or anxiety.  She has been taking care of her diabetes and spending time with her husband at home. She reported that due to the cold weather she is not eating too much and has been helping her husband.  She stated that the trazodone is helping with her sleep and she does not want to go higher on the dose of the medication.She denied having any mood swings anger anxiety or paranoia  Patient denied having any suicidal ideations or plans.  Chief Complaint:  Chief Complaint    Follow-up; Medication Refill; Medication Problem; Insomnia     Visit Diagnosis:     ICD-9-CM ICD-10-CM   1. MDD (major depressive disorder), recurrent episode, moderate (HCC) 296.32 F33.1   2. GAD (generalized anxiety disorder) 300.02 F41.1     Past Medical History:  Past Medical History  Diagnosis Date  . Anxiety   . Depression   . Diabetes mellitus, type II ([place])   . Hypertension   . COPD (chronic obstructive pulmonary disease) ([place])   . Asthma   . Headache     Past Surgical History  Procedure Laterality Date  . Cervical bone infusion    . Cholecystectomy    . Back surgery    . Tubal ligation     Family History:  Family History  Problem Relation Age of Onset  . Anxiety disorder Mother   . Cancer - Lung Mother   . Depression Sister   . Cancer Sister   . Depression Brother   . Cancer Sister   . Cancer Sister   . Cancer Sister   . Heart Problems  Sister   . Bipolar disorder Sister   . Diabetes Sister   . Cancer - Lung Sister   . Hypertension Sister   . Diabetes Sister   . Hyperlipidemia Sister   . COPD Sister   . Heart Problems Brother   . Cancer Brother   . Cancer Brother   . Cancer Brother   . Breast cancer Maternal Aunt    Social History:  Social History   Social History  . Marital Status: Married    Spouse Name: N/A  . Number of Children: N/A  . Years of Education: N/A   Social History Main Topics  . Smoking status: Current Every Day Smoker -- 1.00 packs/day for 40 years    Types: Cigarettes    Start date: 05/05/1975  . Smokeless tobacco: Never Used  . Alcohol Use: No     Comment: socially  . Drug Use: No  . Sexual Activity: No   Other Topics Concern  . None   Social History Narrative   Additional History:   She currently lives with her husband. She reported that they live on the same property as her son and his family  Assessment:   Musculoskeletal: Strength & Muscle Tone: within normal limits Gait & Station: normal Patient leans: N/A  Psychiatric Specialty Exam:  Insomnia PMH includes: no depression.    Review of Systems  Musculoskeletal: Positive for back pain.  Psychiatric/Behavioral: Negative for depression. The patient is nervous/anxious and has insomnia.   All other systems reviewed and are negative.   Blood pressure 120/70, pulse 81, temperature 98.9 F (37.2 C), temperature source Tympanic, height '5\' 3"'$  (1.6 m), weight 184 lb 9.6 oz (83.734 kg), SpO2 91 %.Body mass index is 32.71 kg/(m^2).  General Appearance: Casual  Eye Contact:  Fair  Speech:  Clear and Coherent  Volume:  Normal  Mood:  Euthymic  Affect:  Congruent  Thought Process:  Coherent  Orientation:  Full (Time, Place, and Person)  Thought Content:  WDL  Suicidal Thoughts:  No  Homicidal Thoughts:  No  Memory:  Immediate;   Fair  Judgement:  Fair  Insight:  Fair  Psychomotor Activity:  Normal  Concentration:   Fair  Recall:  [employer] of Knowledge: Fair  Language: Fair  Akathisia:  No  Handed:  Right  AIMS (if indicated):    Assets:  Communication Skills Housing Physical Health Social Support  ADL's:  Intact  Cognition: WNL  Sleep:  8-9    Is the patient at risk to self?  No. Has the patient been a risk to self in the past 6 months?  No. Has the patient been a risk to self within the distant past?  No. Is the patient a risk to others?  No. Has the patient been a risk to others in the past 6 months?  No. Has the patient been a risk to others within the distant past?  No.  Current Medications: Current Outpatient Prescriptions  Medication Sig Dispense Refill  . ACCU-CHEK FASTCLIX LANCETS MISC     . ACCU-CHEK SMARTVIEW test strip     . albuterol (PROVENTIL HFA) 108 (90 BASE) MCG/ACT inhaler Inhale into the lungs.    . ALPRAZolam (XANAX) 0.5 MG tablet Take 1 tablet (0.5 mg total) by mouth 2 (two) times daily. 60 tablet 2  . amLODipine (NORVASC) 10 MG tablet Take 1 tablet by mouth daily.    Marland Kitchen aspirin 81 MG chewable tablet Chew 1 tablet by mouth daily.    Marland Kitchen esomeprazole (NEXIUM) 40 MG capsule Take 40 mg by mouth daily.    . metFORMIN (GLUCOPHAGE) 500 MG tablet Take 1 tablet by mouth every morning.    . metoprolol tartrate (LOPRESSOR) 25 MG tablet Take 1 tablet by mouth 2 (two) times daily.    . potassium chloride (KLOR-CON) 8 MEQ tablet Take 1 tablet by mouth daily.    . pravastatin (PRAVACHOL) 80 MG tablet Take 1 tablet by mouth every evening.    Marland Kitchen spironolactone (ALDACTONE) 50 MG tablet Take 1 tablet by mouth daily.    Marland Kitchen tiotropium (SPIRIVA) 18 MCG inhalation capsule Place 1 mcg into inhaler and inhale daily.    . traZODone (DESYREL) 100 MG tablet Take 0.5 tablets (50 mg total) by mouth at bedtime. 30 tablet 1  . venlafaxine XR (EFFEXOR-XR) 150 MG 24 hr capsule Take 1 capsule (150 mg total) by mouth daily with breakfast. 90 capsule 2   No current facility-administered medications for  this visit.    Medical Decision Making:  Established Problem, Stable/Improving (1), Review of Psycho-Social Stressors (1) and Review of Last Therapy Session (1)  Treatment Plan Summary:Medication management   Depression Continue with venlafaxine 150 ng in the morning  Anxiety Continue with Xanax 0.5 mg by mouth twice a day  Insomnia She will  continue on trazodone 100 mg by mouth daily at bedtime  Follow-up in 3 months    This note was generated in part or whole with voice recognition software. Voice regonition is usually quite accurate but there are transcription errors that can and very often do occur. I apologize for any typographical errors that were not detected and corrected.   Rainey Pines, MD    12/29/2015, 9:38 AM

## 2016-01-09 DIAGNOSIS — J449 Chronic obstructive pulmonary disease, unspecified: Secondary | ICD-10-CM | POA: Diagnosis not present

## 2016-01-19 DIAGNOSIS — J449 Chronic obstructive pulmonary disease, unspecified: Secondary | ICD-10-CM | POA: Diagnosis not present

## 2016-02-09 DIAGNOSIS — J449 Chronic obstructive pulmonary disease, unspecified: Secondary | ICD-10-CM | POA: Diagnosis not present

## 2016-02-14 DIAGNOSIS — I10 Essential (primary) hypertension: Secondary | ICD-10-CM | POA: Diagnosis not present

## 2016-02-14 DIAGNOSIS — E782 Mixed hyperlipidemia: Secondary | ICD-10-CM | POA: Diagnosis not present

## 2016-02-14 DIAGNOSIS — E119 Type 2 diabetes mellitus without complications: Secondary | ICD-10-CM | POA: Diagnosis not present

## 2016-02-14 DIAGNOSIS — K219 Gastro-esophageal reflux disease without esophagitis: Secondary | ICD-10-CM | POA: Diagnosis not present

## 2016-02-18 DIAGNOSIS — J449 Chronic obstructive pulmonary disease, unspecified: Secondary | ICD-10-CM | POA: Diagnosis not present

## 2016-03-02 DIAGNOSIS — J449 Chronic obstructive pulmonary disease, unspecified: Secondary | ICD-10-CM | POA: Diagnosis not present

## 2016-03-02 DIAGNOSIS — R55 Syncope and collapse: Secondary | ICD-10-CM | POA: Diagnosis not present

## 2016-03-02 DIAGNOSIS — E119 Type 2 diabetes mellitus without complications: Secondary | ICD-10-CM | POA: Diagnosis not present

## 2016-03-07 DIAGNOSIS — I493 Ventricular premature depolarization: Secondary | ICD-10-CM | POA: Diagnosis not present

## 2016-03-10 DIAGNOSIS — J449 Chronic obstructive pulmonary disease, unspecified: Secondary | ICD-10-CM | POA: Diagnosis not present

## 2016-03-20 DIAGNOSIS — J449 Chronic obstructive pulmonary disease, unspecified: Secondary | ICD-10-CM | POA: Diagnosis not present

## 2016-03-26 DIAGNOSIS — R55 Syncope and collapse: Secondary | ICD-10-CM | POA: Diagnosis not present

## 2016-03-29 ENCOUNTER — Ambulatory Visit (INDEPENDENT_AMBULATORY_CARE_PROVIDER_SITE_OTHER): Payer: Commercial Managed Care - HMO | Admitting: Psychiatry

## 2016-03-29 ENCOUNTER — Encounter: Payer: Self-pay | Admitting: Psychiatry

## 2016-03-29 VITALS — BP 122/80 | HR 94 | Temp 98.3°F | Ht 63.0 in | Wt 180.0 lb

## 2016-03-29 DIAGNOSIS — F331 Major depressive disorder, recurrent, moderate: Secondary | ICD-10-CM

## 2016-03-29 DIAGNOSIS — F411 Generalized anxiety disorder: Secondary | ICD-10-CM

## 2016-03-29 MED ORDER — VENLAFAXINE HCL ER 150 MG PO CP24: 150.0000 mg | ORAL_CAPSULE | Freq: Every day | ORAL | Status: DC

## 2016-03-29 MED ORDER — TRAZODONE HCL 50 MG PO TABS: 50.0000 mg | ORAL_TABLET | Freq: Every day | ORAL | Status: DC

## 2016-03-29 MED ORDER — ALPRAZOLAM 0.5 MG PO TABS: 0.5000 mg | ORAL_TABLET | Freq: Two times a day (BID) | ORAL | Status: DC

## 2016-03-29 NOTE — Progress Notes (Signed)
BH MD/PA/NP OP Progress Note  03/29/2016 9:44 AM Suzanne Glenn  MRN:  [id number]  Subjective:    Pt  is a 65 year old female who presented for follow-up appointment. She reported that she continues to have sleep problem and reported that she sleeps intermittently throughout the night. She reported that she has been taking trazodone and Xanax at bedtime. She also mentioned that she tripped and fell while sitting from the chair and her husband woke her up. She reported that she is undergoing testing at her primary care office at this time. Patient reported that she has COPD and she spends most of the time sitting in the chair. She is planning to babysit her33-monthold great grandson and is excited about the same. She denied having any abuse of her medications and stated that she takes Xanax twice daily and trazodone at bedtime. She wants to increase the dose of her trazodone at this time. She has been compliant with her medications. She appeared calm and collective during the interview.   he uses oxygen intermittently at home. Patient denied having any suicidal ideations or plans. She denied having any perceptual disturbances.    Chief Complaint:  Chief Complaint    Fatigue; Follow-up; Medication Refill; Insomnia     Visit Diagnosis:     ICD-9-CM ICD-10-CM   1. MDD (major depressive disorder), recurrent episode, moderate (HCC) 296.32 F33.1   2. GAD (generalized anxiety disorder) 300.02 F41.1     Past Medical History:  Past Medical History  Diagnosis Date  . Anxiety   . Depression   . [place], type II [place]   . Hypertension   . [place]) [place]   . Asthma   . Headache     Past Surgical History  Procedure Laterality Date  . Cervical bone infusion    . Cholecystectomy    . Back surgery    . Tubal ligation     Family History:  Family History  Problem Relation Age of Onset  . Anxiety disorder Mother   . Cancer - Lung Mother   .  Depression Sister   . Cancer Sister   . Depression Brother   . Cancer Sister   . Cancer Sister   . Cancer Sister   . Heart Problems Sister   . Bipolar disorder Sister   . Diabetes Sister   . Cancer - Lung Sister   . Hypertension Sister   . Diabetes Sister   . Hyperlipidemia Sister   . COPD Sister   . Heart Problems Brother   . Cancer Brother   . Cancer Brother   . Cancer Brother   . Breast cancer Maternal Aunt    Social History:  Social History   Social History  . Marital Status: Married    Spouse Name: N/A  . Number of Children: N/A  . Years of Education: N/A   Social History Main Topics  . Smoking status: Current Every Day Smoker -- 1.00 packs/day for 40 years    Types: Cigarettes    Start date: 05/05/1975  . Smokeless tobacco: Never Used  . Alcohol Use: No     Comment: socially  . Drug Use: No  . Sexual Activity: No   Other Topics Concern  . None   Social History Narrative   Additional History:   She currently lives with her husband. She reported that they live on the same property as her son and his family  Assessment:   Musculoskeletal: Strength & Muscle  Tone: within normal limits Gait & Station: normal Patient leans: N/A  Psychiatric Specialty Exam: Insomnia PMH includes: no depression.    Review of Systems  Musculoskeletal: Positive for back pain.  Psychiatric/Behavioral: Negative for depression. The patient is nervous/anxious and has insomnia.   All other systems reviewed and are negative.   Blood pressure 122/80, pulse 94, temperature 98.3 F (36.8 C), temperature source Tympanic, height _0  (1.6 m), weight 180 lb (81.647 kg), SpO2 93 %.Body mass index is 31.89 kg/(m^2).  General Appearance: Casual  Eye Contact:  Fair  Speech:  Clear and Coherent  Volume:  Normal  Mood:  Euthymic  Affect:  Congruent  Thought Process:  Coherent  Orientation:  Full (Time, Place, and Person)  Thought Content:  WDL  Suicidal Thoughts:  No  Homicidal  Thoughts:  No  Memory:  Immediate;   Fair  Judgement:  Fair  Insight:  Fair  Psychomotor Activity:  Normal  Concentration:  Fair  Recall:  [employer] of Knowledge: Fair  Language: Fair  Akathisia:  No  Handed:  Right  AIMS (if indicated):    Assets:  Communication Skills Housing Physical Health Social Support  ADL's:  Intact  Cognition: WNL  Sleep:  8-9    Is the patient at risk to self?  No. Has the patient been a risk to self in the past 6 months?  No. Has the patient been a risk to self within the distant past?  No. Is the patient a risk to others?  No. Has the patient been a risk to others in the past 6 months?  No. Has the patient been a risk to others within the distant past?  No.  Current Medications: Current Outpatient Prescriptions  Medication Sig Dispense Refill  . ACCU-CHEK FASTCLIX LANCETS MISC     . ACCU-CHEK SMARTVIEW test strip     . albuterol (PROVENTIL HFA) 108 (90 BASE) MCG/ACT inhaler Inhale into the lungs.    . ALPRAZolam (XANAX) 0.5 MG tablet Take 1 tablet (0.5 mg total) by mouth 2 (two) times daily. 60 tablet 2  . amLODipine (NORVASC) 10 MG tablet Take 1 tablet by mouth daily.    Marland Kitchen aspirin 81 MG chewable tablet Chew 1 tablet by mouth daily.    Marland Kitchen esomeprazole (NEXIUM) 40 MG capsule Take 40 mg by mouth daily.    . metFORMIN (GLUCOPHAGE) 500 MG tablet Take 1 tablet by mouth every morning.    . metoprolol tartrate (LOPRESSOR) 25 MG tablet Take 1 tablet by mouth 2 (two) times daily.    . potassium chloride (KLOR-CON) 8 MEQ tablet Take 1 tablet by mouth daily.    . pravastatin (PRAVACHOL) 80 MG tablet Take 1 tablet by mouth every evening.    Marland Kitchen spironolactone (ALDACTONE) 50 MG tablet Take 1 tablet by mouth daily.    Marland Kitchen tiotropium (SPIRIVA) 18 MCG inhalation capsule Place 1 mcg into inhaler and inhale daily.    . traZODone (DESYREL) 100 MG tablet Take 1 tablet (100 mg total) by mouth at bedtime. 90 tablet 1  . venlafaxine XR (EFFEXOR-XR) 150 MG 24 hr capsule  Take 1 capsule (150 mg total) by mouth daily with breakfast. 90 capsule 2   No current facility-administered medications for this visit.    Medical Decision Making:  Established Problem, Stable/Improving (1), Review of Psycho-Social Stressors (1) and Review of Last Therapy Session (1)  Treatment Plan Summary:Medication management   Depression Continue with venlafaxine 150 ng in the morning  Anxiety Continue  with Xanax 0.5 mg by mouth twice a day- advised her to decrease the dose of Xanax to 0.25 mg in the morning and she agreed with the plan  Insomnia She will continue on trazodone 100 mg by mouth daily at bedtime. I will give her extra prescription of trazodone 50 mg so she can titrate the dose to 150 mg at bedtime and she agreed with the plan.  Follow-up in 3 months    This note was generated in part or whole with voice recognition software. Voice regonition is usually quite accurate but there are transcription errors that can and very often do occur. I apologize for any typographical errors that were not detected and corrected.   Rainey Pines, MD    03/29/2016, 9:44 AM

## 2016-03-30 DIAGNOSIS — R55 Syncope and collapse: Secondary | ICD-10-CM | POA: Diagnosis not present

## 2016-03-30 DIAGNOSIS — I2510 Atherosclerotic heart disease of native coronary artery without angina pectoris: Secondary | ICD-10-CM | POA: Diagnosis not present

## 2016-03-30 DIAGNOSIS — I6523 Occlusion and stenosis of bilateral carotid arteries: Secondary | ICD-10-CM | POA: Insufficient documentation

## 2016-03-30 DIAGNOSIS — E782 Mixed hyperlipidemia: Secondary | ICD-10-CM | POA: Diagnosis not present

## 2016-04-04 DIAGNOSIS — J449 Chronic obstructive pulmonary disease, unspecified: Secondary | ICD-10-CM | POA: Diagnosis not present

## 2016-04-04 DIAGNOSIS — R0609 Other forms of dyspnea: Secondary | ICD-10-CM | POA: Diagnosis not present

## 2016-04-04 DIAGNOSIS — R0789 Other chest pain: Secondary | ICD-10-CM | POA: Diagnosis not present

## 2016-04-04 DIAGNOSIS — R0602 Shortness of breath: Secondary | ICD-10-CM | POA: Diagnosis not present

## 2016-04-04 DIAGNOSIS — R05 Cough: Secondary | ICD-10-CM | POA: Diagnosis not present

## 2016-04-10 DIAGNOSIS — J449 Chronic obstructive pulmonary disease, unspecified: Secondary | ICD-10-CM | POA: Diagnosis not present

## 2016-04-19 DIAGNOSIS — J449 Chronic obstructive pulmonary disease, unspecified: Secondary | ICD-10-CM | POA: Diagnosis not present

## 2016-05-10 DIAGNOSIS — J449 Chronic obstructive pulmonary disease, unspecified: Secondary | ICD-10-CM | POA: Diagnosis not present

## 2016-05-17 DIAGNOSIS — E119 Type 2 diabetes mellitus without complications: Secondary | ICD-10-CM | POA: Diagnosis not present

## 2016-05-17 DIAGNOSIS — I10 Essential (primary) hypertension: Secondary | ICD-10-CM | POA: Diagnosis not present

## 2016-05-20 DIAGNOSIS — J449 Chronic obstructive pulmonary disease, unspecified: Secondary | ICD-10-CM | POA: Diagnosis not present

## 2016-06-05 ENCOUNTER — Other Ambulatory Visit: Payer: Self-pay | Admitting: Internal Medicine

## 2016-06-05 DIAGNOSIS — Z1239 Encounter for other screening for malignant neoplasm of breast: Secondary | ICD-10-CM | POA: Diagnosis not present

## 2016-06-05 DIAGNOSIS — Z124 Encounter for screening for malignant neoplasm of cervix: Secondary | ICD-10-CM | POA: Diagnosis not present

## 2016-06-05 DIAGNOSIS — Z23 Encounter for immunization: Secondary | ICD-10-CM | POA: Diagnosis not present

## 2016-06-05 DIAGNOSIS — Z0000 Encounter for general adult medical examination without abnormal findings: Secondary | ICD-10-CM | POA: Diagnosis not present

## 2016-06-05 DIAGNOSIS — Z780 Asymptomatic menopausal state: Secondary | ICD-10-CM | POA: Diagnosis not present

## 2016-06-10 DIAGNOSIS — J449 Chronic obstructive pulmonary disease, unspecified: Secondary | ICD-10-CM | POA: Diagnosis not present

## 2016-06-13 DIAGNOSIS — Z780 Asymptomatic menopausal state: Secondary | ICD-10-CM | POA: Diagnosis not present

## 2016-06-20 DIAGNOSIS — J449 Chronic obstructive pulmonary disease, unspecified: Secondary | ICD-10-CM | POA: Diagnosis not present

## 2016-06-26 ENCOUNTER — Ambulatory Visit: Payer: Commercial Managed Care - HMO

## 2016-06-26 ENCOUNTER — Other Ambulatory Visit: Payer: Self-pay | Admitting: Psychiatry

## 2016-06-27 DIAGNOSIS — E119 Type 2 diabetes mellitus without complications: Secondary | ICD-10-CM | POA: Diagnosis not present

## 2016-06-29 ENCOUNTER — Ambulatory Visit (INDEPENDENT_AMBULATORY_CARE_PROVIDER_SITE_OTHER): Payer: Commercial Managed Care - HMO | Admitting: Psychiatry

## 2016-06-29 ENCOUNTER — Encounter: Payer: Self-pay | Admitting: Psychiatry

## 2016-06-29 DIAGNOSIS — F331 Major depressive disorder, recurrent, moderate: Secondary | ICD-10-CM

## 2016-06-29 DIAGNOSIS — F411 Generalized anxiety disorder: Secondary | ICD-10-CM

## 2016-06-29 MED ORDER — TRAZODONE HCL 150 MG PO TABS: 150.0000 mg | ORAL_TABLET | Freq: Every day | ORAL | 2 refills | Status: DC

## 2016-06-29 MED ORDER — ALPRAZOLAM 0.5 MG PO TABS: 0.5000 mg | ORAL_TABLET | Freq: Every evening | ORAL | 2 refills | Status: DC | PRN

## 2016-06-29 NOTE — Progress Notes (Signed)
BH MD/PA/NP OP Progress Note  06/29/2016 9:38 AM Suzanne Glenn  MRN:  [id number]  Subjective:    Pt  is a 65 year old female who presented for follow-up appointment. She reported that she she has finally started sleeping well with the help of her medications.Pt  reported that she is currently taking trazodone 150 mg at bedtime. She reported that the combination of trazodone and Xanax is helping her and she was able to stop taking the Xanax in the morning. She was very excited about the combination of her medications. She reported that the venlafaxine is helping her in the morning and she is taking the medications as prescribed. She is able to lose 1 pound as well. She appears calm and alert during the interview. She reported that she does not have any adverse effects of the medications at this time.  She takes care of her great grandson in the afternoon and has been alert during the daytime. Patient denied having any perceptual disturbances. She does not have any acute symptoms at this time.   Chief Complaint:  Chief Complaint    Follow-up; Medication Refill     Visit Diagnosis:     ICD-9-CM ICD-10-CM   1. MDD (major depressive disorder), recurrent episode, moderate (HCC) 296.32 F33.1   2. GAD (generalized anxiety disorder) 300.02 F41.1     Past Medical History:  Past Medical History:  Diagnosis Date  . Anxiety   . Asthma   . COPD (chronic obstructive pulmonary disease) ([place])   . Depression   . Diabetes mellitus, type II ([place])   . Headache   . Hypertension     Past Surgical History:  Procedure Laterality Date  . BACK SURGERY    . cervical bone infusion    . CHOLECYSTECTOMY    . TUBAL LIGATION     Family History:  Family History  Problem Relation Age of Onset  . Anxiety disorder Mother   . Cancer - Lung Mother   . Depression Sister   . Cancer Sister   . Depression Brother   . Cancer Sister   . Cancer Sister   . Cancer Sister   . Heart Problems Sister   . Bipolar  disorder Sister   . Diabetes Sister   . Cancer - Lung Sister   . Hypertension Sister   . Diabetes Sister   . Hyperlipidemia Sister   . COPD Sister   . Heart Problems Brother   . Cancer Brother   . Cancer Brother   . Cancer Brother   . Breast cancer Maternal Aunt    Social History:  Social History   Social History  . Marital status: Married    Spouse name: N/A  . Number of children: N/A  . Years of education: N/A   Social History Main Topics  . Smoking status: Current Every Day Smoker    Packs/day: 1.00    Years: 40.00    Types: Cigarettes    Start date: 05/05/1975  . Smokeless tobacco: Never Used  . Alcohol use No     Comment: socially  . Drug use: No  . Sexual activity: No   Other Topics Concern  . None   Social History Narrative  . None   Additional History:   She currently lives with her husband. She reported that they live on the same property as her son and his family  Assessment:   Musculoskeletal: Strength & Muscle Tone: within normal limits Gait & Station: normal Patient leans: N/A  Psychiatric Specialty Exam: Insomnia  PMH includes: no depression.  Medication Refill     Review of Systems  Musculoskeletal: Positive for back pain.  Psychiatric/Behavioral: Negative for depression. The patient is nervous/anxious and has insomnia.   All other systems reviewed and are negative.   There were no vitals taken for this visit.There is no height or weight on file to calculate BMI.  General Appearance: Casual  Eye Contact:  Fair  Speech:  Clear and Coherent  Volume:  Normal  Mood:  Euthymic  Affect:  Congruent  Thought Process:  Coherent  Orientation:  Full (Time, Place, and Person)  Thought Content:  WDL  Suicidal Thoughts:  No  Homicidal Thoughts:  No  Memory:  Immediate;   Fair  Judgement:  Fair  Insight:  Fair  Psychomotor Activity:  Normal  Concentration:  Fair  Recall:  [employer] of Knowledge: Fair  Language: Fair  Akathisia:  No   Handed:  Right  AIMS (if indicated):    Assets:  Communication Skills Housing Physical Health Social Support  ADL's:  Intact  Cognition: WNL  Sleep:  8-9    Is the patient at risk to self?  No. Has the patient been a risk to self in the past 6 months?  No. Has the patient been a risk to self within the distant past?  No. Is the patient a risk to others?  No. Has the patient been a risk to others in the past 6 months?  No. Has the patient been a risk to others within the distant past?  No.  Current Medications: Current Outpatient Prescriptions  Medication Sig Dispense Refill  . ACCU-CHEK FASTCLIX LANCETS MISC     . ACCU-CHEK SMARTVIEW test strip     . albuterol (PROVENTIL HFA) 108 (90 BASE) MCG/ACT inhaler Inhale into the lungs.    . ALPRAZolam (XANAX) 0.5 MG tablet Take 1 tablet (0.5 mg total) by mouth 2 (two) times daily. 60 tablet 2  . amLODipine (NORVASC) 10 MG tablet Take 1 tablet by mouth daily.    Marland Kitchen aspirin 81 MG chewable tablet Chew 1 tablet by mouth daily.    Marland Kitchen esomeprazole (NEXIUM) 40 MG capsule Take 40 mg by mouth daily.    . metFORMIN (GLUCOPHAGE) 500 MG tablet Take 1 tablet by mouth every morning.    . metoprolol tartrate (LOPRESSOR) 25 MG tablet Take 1 tablet by mouth 2 (two) times daily.    . potassium chloride (KLOR-CON) 8 MEQ tablet Take 1 tablet by mouth daily.    . pravastatin (PRAVACHOL) 80 MG tablet Take 1 tablet by mouth every evening.    Marland Kitchen spironolactone (ALDACTONE) 50 MG tablet Take 1 tablet by mouth daily.    Marland Kitchen tiotropium (SPIRIVA) 18 MCG inhalation capsule Place 1 mcg into inhaler and inhale daily.    . traZODone (DESYREL) 100 MG tablet Take 1 tablet (100 mg total) by mouth at bedtime. 90 tablet 1  . traZODone (DESYREL) 50 MG tablet Take 1 tablet (50 mg total) by mouth at bedtime. 90 tablet 0  . venlafaxine XR (EFFEXOR-XR) 150 MG 24 hr capsule Take 1 capsule (150 mg total) by mouth daily with breakfast. 90 capsule 2   No current facility-administered  medications for this visit.     Medical Decision Making:  Established Problem, Stable/Improving (1), Review of Psycho-Social Stressors (1) and Review of Last Therapy Session (1)  Treatment Plan Summary:Medication management   Depression Continue with venlafaxine 150 mg in the morning  Anxiety  Continue with Xanax 0.5 mg by mouth Daily at at bedtime.  Patient will continue with trazodone 150 mg at bedtime.  Follow-up in 2 months    This note was generated in part or whole with voice recognition software. Voice regonition is usually quite accurate but there are transcription errors that can and very often do occur. I apologize for any typographical errors that were not detected and corrected.   Rainey Pines, MD    06/29/2016, 9:38 AM

## 2016-07-11 DIAGNOSIS — J449 Chronic obstructive pulmonary disease, unspecified: Secondary | ICD-10-CM | POA: Diagnosis not present

## 2016-07-12 ENCOUNTER — Ambulatory Visit
Admission: RE | Admit: 2016-07-12 | Discharge: 2016-07-12 | Disposition: A | Discharge status: Home / Self Care | Payer: Commercial Managed Care - HMO | Source: Ambulatory Visit | Attending: Internal Medicine | Admitting: Internal Medicine

## 2016-07-12 ENCOUNTER — Other Ambulatory Visit: Payer: Self-pay | Admitting: Internal Medicine

## 2016-07-12 DIAGNOSIS — Z1239 Encounter for other screening for malignant neoplasm of breast: Secondary | ICD-10-CM

## 2016-07-12 DIAGNOSIS — Z1231 Encounter for screening mammogram for malignant neoplasm of breast: Secondary | ICD-10-CM | POA: Insufficient documentation

## 2016-07-20 DIAGNOSIS — J449 Chronic obstructive pulmonary disease, unspecified: Secondary | ICD-10-CM | POA: Diagnosis not present

## 2016-08-10 DIAGNOSIS — J449 Chronic obstructive pulmonary disease, unspecified: Secondary | ICD-10-CM | POA: Diagnosis not present

## 2016-08-20 DIAGNOSIS — J449 Chronic obstructive pulmonary disease, unspecified: Secondary | ICD-10-CM | POA: Diagnosis not present

## 2016-08-23 ENCOUNTER — Telehealth: Payer: Self-pay

## 2016-08-23 NOTE — Telephone Encounter (Signed)
Medication management - Telephone call with patient after she left a message she was in need of a new Venlafaxine XR order.  Informed patient this nurse also called [place] and spoke with Mickel Baas, pharmacist to verify they still have refills for her Venlafaxine XR on file. Patient agreed to pick up refill and reminded of her scheduled evaluation with Dr. Gretel Acre set for 08/29/16 at 10:00am.  Patient reported she would be at the appointment and will call back if any problems obtaining needed refill this date.

## 2016-08-29 ENCOUNTER — Encounter: Payer: Self-pay | Admitting: Psychiatry

## 2016-08-29 ENCOUNTER — Ambulatory Visit (INDEPENDENT_AMBULATORY_CARE_PROVIDER_SITE_OTHER): Payer: Commercial Managed Care - HMO | Admitting: Psychiatry

## 2016-08-29 VITALS — BP 120/64 | HR 81 | Ht 63.0 in | Wt 174.4 lb

## 2016-08-29 DIAGNOSIS — F411 Generalized anxiety disorder: Secondary | ICD-10-CM | POA: Diagnosis not present

## 2016-08-29 DIAGNOSIS — F331 Major depressive disorder, recurrent, moderate: Secondary | ICD-10-CM

## 2016-08-29 MED ORDER — ALPRAZOLAM 0.5 MG PO TABS: 0.5000 mg | ORAL_TABLET | Freq: Every evening | ORAL | 2 refills | Status: DC | PRN

## 2016-08-29 MED ORDER — TRAZODONE HCL 150 MG PO TABS: 150.0000 mg | ORAL_TABLET | Freq: Every day | ORAL | 2 refills | Status: DC

## 2016-08-29 MED ORDER — VENLAFAXINE HCL ER 150 MG PO CP24: 150.0000 mg | ORAL_CAPSULE | Freq: Every day | ORAL | 2 refills | Status: DC

## 2016-08-29 NOTE — Progress Notes (Signed)
BH MD/PA/NP OP Progress Note  08/29/2016 10:04 AM Suzanne Glenn  MRN:  [id number]  Subjective:    Pt  is a 65 year old female who presented for follow-up appointment. She reported that  she has been attending holidays with her family members. She lives on the property with her son and takes care of the grandchild. She has good relationship with the family. She is excited about the holidays. Patient reported that she has been compliant with her medication. She states that she sleeps well with the help of trazodone. She is not having any side effects to the medications. She appeared calm and alert the interview. She currently denied having any suicidal ideations or plans. She denied having any perceptual disturbances. We discussed about the medications at length and she is compliant.    She appears calm and alert during the interview. She reported that she does not have any adverse effects of the medications at this time.    Chief Complaint:  Chief Complaint    Follow-up     Visit Diagnosis:     ICD-9-CM ICD-10-CM   1. MDD (major depressive disorder), recurrent episode, moderate (HCC) 296.32 F33.1   2. GAD (generalized anxiety disorder) 300.02 F41.1     Past Medical History:  Past Medical History:  Diagnosis Date  . Anxiety   . Asthma   . COPD (chronic obstructive pulmonary disease) ([place])   . Depression   . Diabetes mellitus, type II ([place])   . Headache   . Hypertension     Past Surgical History:  Procedure Laterality Date  . BACK SURGERY    . cervical bone infusion    . CHOLECYSTECTOMY    . TUBAL LIGATION     Family History:  Family History  Problem Relation Age of Onset  . Anxiety disorder Mother   . Cancer - Lung Mother   . Depression Sister   . Cancer Sister   . Depression Brother   . Cancer Sister   . Cancer Sister   . Cancer Sister   . Heart Problems Sister   . Bipolar disorder Sister   . Diabetes Sister   . Cancer - Lung Sister   . Hypertension Sister    . Diabetes Sister   . Hyperlipidemia Sister   . COPD Sister   . Heart Problems Brother   . Cancer Brother   . Cancer Brother   . Cancer Brother   . Breast cancer Maternal Aunt    Social History:  Social History   Social History  . Marital status: Married    Spouse name: N/A  . Number of children: N/A  . Years of education: N/A   Social History Main Topics  . Smoking status: Current Every Day Smoker    Packs/day: 1.00    Years: 40.00    Types: Cigarettes    Start date: 05/05/1975  . Smokeless tobacco: Never Used  . Alcohol use No     Comment: socially  . Drug use: No  . Sexual activity: No   Other Topics Concern  . None   Social History Narrative  . None   Additional History:   She currently lives with her husband. She reported that they live on the same property as her son and his family  Assessment:   Musculoskeletal: Strength & Muscle Tone: within normal limits Gait & Station: normal Patient leans: N/A  Psychiatric Specialty Exam: Medication Refill   Insomnia  PMH includes: no depression.    Review of  Systems  Musculoskeletal: Positive for back pain.  Psychiatric/Behavioral: Negative for depression. The patient is nervous/anxious and has insomnia.   All other systems reviewed and are negative.   Blood pressure 120/64, pulse 81, height '5\' 3"'$  (1.6 m), weight 174 lb 6.4 oz (79.1 kg).Body mass index is 30.89 kg/m.  General Appearance: Casual  Eye Contact:  Fair  Speech:  Clear and Coherent  Volume:  Normal  Mood:  Euthymic  Affect:  Congruent  Thought Process:  Coherent  Orientation:  Full (Time, Place, and Person)  Thought Content:  WDL  Suicidal Thoughts:  No  Homicidal Thoughts:  No  Memory:  Immediate;   Fair  Judgement:  Fair  Insight:  Fair  Psychomotor Activity:  Normal  Concentration:  Fair  Recall:  [employer] of Knowledge: Fair  Language: Fair  Akathisia:  No  Handed:  Right  AIMS (if indicated):    Assets:  Communication  Skills Housing Physical Health Social Support  ADL's:  Intact  Cognition: WNL  Sleep:  8-9    Is the patient at risk to self?  No. Has the patient been a risk to self in the past 6 months?  No. Has the patient been a risk to self within the distant past?  No. Is the patient a risk to others?  No. Has the patient been a risk to others in the past 6 months?  No. Has the patient been a risk to others within the distant past?  No.  Current Medications: Current Outpatient Prescriptions  Medication Sig Dispense Refill  . ACCU-CHEK FASTCLIX LANCETS MISC     . ACCU-CHEK SMARTVIEW test strip     . albuterol (PROVENTIL HFA) 108 (90 BASE) MCG/ACT inhaler Inhale into the lungs.    . ALPRAZolam (XANAX) 0.5 MG tablet Take 1 tablet (0.5 mg total) by mouth at bedtime as needed for anxiety. 30 tablet 2  . amLODipine (NORVASC) 10 MG tablet Take 1 tablet by mouth daily.    Marland Kitchen aspirin 81 MG chewable tablet Chew 1 tablet by mouth daily.    Marland Kitchen esomeprazole (NEXIUM) 40 MG capsule Take 40 mg by mouth daily.    . metFORMIN (GLUCOPHAGE) 500 MG tablet Take 1 tablet by mouth every morning.    . metoprolol tartrate (LOPRESSOR) 25 MG tablet Take 1 tablet by mouth 2 (two) times daily.    . potassium chloride (KLOR-CON) 8 MEQ tablet Take 1 tablet by mouth daily.    . pravastatin (PRAVACHOL) 80 MG tablet Take 1 tablet by mouth every evening.    Marland Kitchen spironolactone (ALDACTONE) 50 MG tablet Take 1 tablet by mouth daily.    Marland Kitchen tiotropium (SPIRIVA) 18 MCG inhalation capsule Place 1 mcg into inhaler and inhale daily.    . traZODone (DESYREL) 150 MG tablet Take 1 tablet (150 mg total) by mouth at bedtime. 90 tablet 2  . venlafaxine XR (EFFEXOR-XR) 150 MG 24 hr capsule Take 1 capsule (150 mg total) by mouth daily with breakfast. 90 capsule 2   No current facility-administered medications for this visit.     Medical Decision Making:  Established Problem, Stable/Improving (1), Review of Psycho-Social Stressors (1) and Review  of Last Therapy Session (1)  Treatment Plan Summary:Medication management   Depression Continue with venlafaxine 150 mg in the morning  Anxiety Continue with Xanax 0.5 mg by mouth Daily at at bedtime.  Patient will continue with trazodone 150 mg at bedtime.  Medications refilled for the next 3 months. 90 day supply  of the medications are given.   Advised patient that I will be leaving this practice in the end of November and she demonstrated understanding.   This note was generated in part or whole with voice recognition software. Voice regonition is usually quite accurate but there are transcription errors that can and very often do occur. I apologize for any typographical errors that were not detected and corrected.   Rainey Pines, MD    08/29/2016, 10:04 AM

## 2016-09-10 DIAGNOSIS — J449 Chronic obstructive pulmonary disease, unspecified: Secondary | ICD-10-CM | POA: Diagnosis not present

## 2016-09-19 DIAGNOSIS — J449 Chronic obstructive pulmonary disease, unspecified: Secondary | ICD-10-CM | POA: Diagnosis not present

## 2016-10-10 DIAGNOSIS — J449 Chronic obstructive pulmonary disease, unspecified: Secondary | ICD-10-CM | POA: Diagnosis not present

## 2016-10-20 DIAGNOSIS — J449 Chronic obstructive pulmonary disease, unspecified: Secondary | ICD-10-CM | POA: Diagnosis not present

## 2016-10-23 DIAGNOSIS — J449 Chronic obstructive pulmonary disease, unspecified: Secondary | ICD-10-CM | POA: Diagnosis not present

## 2016-10-23 DIAGNOSIS — J441 Chronic obstructive pulmonary disease with (acute) exacerbation: Secondary | ICD-10-CM | POA: Diagnosis not present

## 2016-11-10 DIAGNOSIS — J449 Chronic obstructive pulmonary disease, unspecified: Secondary | ICD-10-CM | POA: Diagnosis not present

## 2016-11-14 DIAGNOSIS — R0902 Hypoxemia: Secondary | ICD-10-CM | POA: Diagnosis not present

## 2016-11-14 DIAGNOSIS — Z720 Tobacco use: Secondary | ICD-10-CM | POA: Diagnosis not present

## 2016-11-14 DIAGNOSIS — J449 Chronic obstructive pulmonary disease, unspecified: Secondary | ICD-10-CM | POA: Diagnosis not present

## 2016-11-14 DIAGNOSIS — R0609 Other forms of dyspnea: Secondary | ICD-10-CM | POA: Diagnosis not present

## 2016-11-20 DIAGNOSIS — J449 Chronic obstructive pulmonary disease, unspecified: Secondary | ICD-10-CM | POA: Diagnosis not present

## 2016-11-29 ENCOUNTER — Ambulatory Visit: Payer: Commercial Managed Care - HMO | Admitting: Psychiatry

## 2016-12-04 DIAGNOSIS — E119 Type 2 diabetes mellitus without complications: Secondary | ICD-10-CM | POA: Diagnosis not present

## 2016-12-04 DIAGNOSIS — I10 Essential (primary) hypertension: Secondary | ICD-10-CM | POA: Diagnosis not present

## 2016-12-04 DIAGNOSIS — K219 Gastro-esophageal reflux disease without esophagitis: Secondary | ICD-10-CM | POA: Diagnosis not present

## 2016-12-04 DIAGNOSIS — E782 Mixed hyperlipidemia: Secondary | ICD-10-CM | POA: Diagnosis not present

## 2016-12-11 DIAGNOSIS — J449 Chronic obstructive pulmonary disease, unspecified: Secondary | ICD-10-CM | POA: Diagnosis not present

## 2016-12-17 ENCOUNTER — Encounter: Payer: Self-pay | Admitting: Psychiatry

## 2016-12-17 ENCOUNTER — Ambulatory Visit (INDEPENDENT_AMBULATORY_CARE_PROVIDER_SITE_OTHER): Payer: Commercial Managed Care - HMO | Admitting: Psychiatry

## 2016-12-17 VITALS — BP 123/77 | HR 82 | Temp 98.4°F | Wt 173.0 lb

## 2016-12-17 DIAGNOSIS — F411 Generalized anxiety disorder: Secondary | ICD-10-CM | POA: Diagnosis not present

## 2016-12-17 DIAGNOSIS — F331 Major depressive disorder, recurrent, moderate: Secondary | ICD-10-CM | POA: Diagnosis not present

## 2016-12-17 MED ORDER — ALPRAZOLAM 0.5 MG PO TABS: 0.5000 mg | ORAL_TABLET | Freq: Every evening | ORAL | 2 refills | Status: DC | PRN

## 2016-12-17 MED ORDER — VENLAFAXINE HCL ER 150 MG PO CP24: 150.0000 mg | ORAL_CAPSULE | Freq: Every day | ORAL | 2 refills | Status: DC

## 2016-12-17 MED ORDER — TRAZODONE HCL 150 MG PO TABS: 150.0000 mg | ORAL_TABLET | Freq: Every day | ORAL | 2 refills | Status: DC

## 2016-12-17 NOTE — Progress Notes (Signed)
BH MD/PA/NP OP Progress Note  12/17/2016 10:52 AM Suzanne EDGECOMBE  MRN:  [id number]  Subjective:    Pt  is a 66 year old female who presented for follow-up appointment. She reported that  she has been doing well. She reported that she is spending time with her 55-monthold grandson who comes to stay with them at least 2 times per week. She reported that her husband also helps with her taking care of the grand baby. She reported that she has also raised her 16year old grandson. She is enjoying the time with them. She stated that she has been compliant with her medication and takes her medication as prescribed. She appeared calm and alert during the interview. She currently denied having any side effects of the medications. She sleeps well with the help of her medications. Appeared motivated. Denied having any perceptual disturbances. Denied having any suicidal homicidal ideations or plans.    She appears calm and alert during the interview. She reported that she does not have any adverse effects of the medications at this time.    Chief Complaint:  Chief Complaint    Follow-up; Medication Refill     Visit Diagnosis:     ICD-9-CM ICD-10-CM   1. MDD (major depressive disorder), recurrent episode, moderate (HCC) 296.32 F33.1   2. GAD (generalized anxiety disorder) 300.02 F41.1     Past Medical History:  Past Medical History:  Diagnosis Date  . Anxiety   . Asthma   . [place] disease) [place]   . Depression   . [place]   . Headache   . Hypertension     Past Surgical History:  Procedure Laterality Date  . BACK SURGERY    . cervical bone infusion    . CHOLECYSTECTOMY    . TUBAL LIGATION     Family History:  Family History  Problem Relation Age of Onset  . Anxiety disorder Mother   . Cancer - Lung Mother   . Depression Sister   . Cancer Sister   . Depression Brother   . Cancer Sister   . Cancer Sister   . Cancer Sister    . Heart Problems Sister   . Bipolar disorder Sister   . Diabetes Sister   . Cancer - Lung Sister   . Hypertension Sister   . Diabetes Sister   . Hyperlipidemia Sister   . COPD Sister   . Heart Problems Brother   . Cancer Brother   . Cancer Brother   . Cancer Brother   . Breast cancer Maternal Aunt    Social History:  Social History   Social History  . Marital status: Married    Spouse name: N/A  . Number of children: N/A  . Years of education: N/A   Social History Main Topics  . Smoking status: Current Every Day Smoker    Packs/day: 1.00    Years: 40.00    Types: Cigarettes    Start date: 05/05/1975  . Smokeless tobacco: Never Used  . Alcohol use No     Comment: socially  . Drug use: No  . Sexual activity: No   Other Topics Concern  . None   Social History Narrative  . None   Additional History:   She currently lives with her husband. She reported that they live on the same property as her son and his family  Assessment:   Musculoskeletal: Strength & Muscle Tone: within normal limits Gait & Station: normal Patient leans:  N/A  Psychiatric Specialty Exam: Medication Refill  Associated symptoms include coughing and myalgias.  Insomnia  PMH includes: no depression.    Review of Systems  Respiratory: Positive for cough, shortness of breath and wheezing.   Musculoskeletal: Positive for back pain and myalgias.  Psychiatric/Behavioral: Negative for depression. The patient is nervous/anxious.   All other systems reviewed and are negative.   Blood pressure 123/77, pulse 82, temperature 98.4 F (36.9 C), temperature source Oral, weight 173 lb (78.5 kg).Body mass index is 30.65 kg/m.  General Appearance: Casual  Eye Contact:  Fair  Speech:  Clear and Coherent  Volume:  Normal  Mood:  Euthymic  Affect:  Congruent  Thought Process:  Coherent  Orientation:  Full (Time, Place, and Person)  Thought Content:  WDL  Suicidal Thoughts:  No  Homicidal Thoughts:   No  Memory:  Immediate;   Fair  Judgement:  Fair  Insight:  Fair  Psychomotor Activity:  Normal  Concentration:  Fair  Recall:  [employer] of Knowledge: Fair  Language: Fair  Akathisia:  No  Handed:  Right  AIMS (if indicated):    Assets:  Communication Skills Housing Physical Health Social Support  ADL's:  Intact  Cognition: WNL  Sleep:  8-9    Is the patient at risk to self?  No. Has the patient been a risk to self in the past 6 months?  No. Has the patient been a risk to self within the distant past?  No. Is the patient a risk to others?  No. Has the patient been a risk to others in the past 6 months?  No. Has the patient been a risk to others within the distant past?  No.  Current Medications: Current Outpatient Prescriptions  Medication Sig Dispense Refill  . ACCU-CHEK FASTCLIX LANCETS MISC     . ACCU-CHEK SMARTVIEW test strip     . albuterol (PROVENTIL HFA) 108 (90 BASE) MCG/ACT inhaler Inhale into the lungs.    . ALPRAZolam (XANAX) 0.5 MG tablet Take 1 tablet (0.5 mg total) by mouth at bedtime as needed for anxiety. 30 tablet 2  . amLODipine (NORVASC) 10 MG tablet Take 1 tablet by mouth daily.    Marland Kitchen aspirin 81 MG chewable tablet Chew 1 tablet by mouth daily.    Marland Kitchen esomeprazole (NEXIUM) 40 MG capsule Take 40 mg by mouth daily.    . metFORMIN (GLUCOPHAGE) 500 MG tablet Take 1 tablet by mouth every morning.    . metoprolol tartrate (LOPRESSOR) 25 MG tablet Take 1 tablet by mouth 2 (two) times daily.    . potassium chloride (KLOR-CON) 8 MEQ tablet Take 1 tablet by mouth daily.    . pravastatin (PRAVACHOL) 80 MG tablet Take 1 tablet by mouth every evening.    Marland Kitchen spironolactone (ALDACTONE) 50 MG tablet Take 1 tablet by mouth daily.    Marland Kitchen tiotropium (SPIRIVA) 18 MCG inhalation capsule Place 1 mcg into inhaler and inhale daily.    . traZODone (DESYREL) 150 MG tablet Take 1 tablet (150 mg total) by mouth at bedtime. 90 tablet 2  . venlafaxine XR (EFFEXOR-XR) 150 MG 24 hr  capsule Take 1 capsule (150 mg total) by mouth daily with breakfast. 90 capsule 2   No current facility-administered medications for this visit.     Medical Decision Making:  Established Problem, Stable/Improving (1), Review of Psycho-Social Stressors (1) and Review of Last Therapy Session (1)  Treatment Plan Summary:Medication management   Depression Continue with venlafaxine 150 mg  in the morning  Anxiety Continue with Xanax 0.5 mg by mouth Daily at at bedtime.  Patient will continue with trazodone 150 mg at bedtime.  Medications refilled for the next 3 months. 90 day supply of the medications are given.    This note was generated in part or whole with voice recognition software. Voice regonition is usually quite accurate but there are transcription errors that can and very often do occur. I apologize for any typographical errors that were not detected and corrected.   Rainey Pines, MD    12/17/2016, 10:52 AM

## 2016-12-18 DIAGNOSIS — J449 Chronic obstructive pulmonary disease, unspecified: Secondary | ICD-10-CM | POA: Diagnosis not present

## 2017-01-08 DIAGNOSIS — J449 Chronic obstructive pulmonary disease, unspecified: Secondary | ICD-10-CM | POA: Diagnosis not present

## 2017-01-18 DIAGNOSIS — J449 Chronic obstructive pulmonary disease, unspecified: Secondary | ICD-10-CM | POA: Diagnosis not present

## 2017-02-08 DIAGNOSIS — J449 Chronic obstructive pulmonary disease, unspecified: Secondary | ICD-10-CM | POA: Diagnosis not present

## 2017-02-13 ENCOUNTER — Ambulatory Visit: Payer: Self-pay | Admitting: Psychiatry

## 2017-02-17 DIAGNOSIS — J449 Chronic obstructive pulmonary disease, unspecified: Secondary | ICD-10-CM | POA: Diagnosis not present

## 2017-02-24 ENCOUNTER — Emergency Department: Payer: Medicare HMO

## 2017-02-24 ENCOUNTER — Inpatient Hospital Stay
Admission: EM | Admit: 2017-02-24 | Discharge: 2017-02-26 | DRG: 871 | Disposition: A | Discharge status: Home Health | Payer: Medicare HMO | Attending: Internal Medicine | Admitting: Internal Medicine

## 2017-02-24 ENCOUNTER — Encounter: Payer: Self-pay | Admitting: Emergency Medicine

## 2017-02-24 DIAGNOSIS — Z888 Allergy status to other drugs, medicaments and biological substances status: Secondary | ICD-10-CM | POA: Diagnosis not present

## 2017-02-24 DIAGNOSIS — E119 Type 2 diabetes mellitus without complications: Secondary | ICD-10-CM | POA: Diagnosis present

## 2017-02-24 DIAGNOSIS — J441 Chronic obstructive pulmonary disease with (acute) exacerbation: Secondary | ICD-10-CM | POA: Diagnosis not present

## 2017-02-24 DIAGNOSIS — E876 Hypokalemia: Secondary | ICD-10-CM | POA: Diagnosis not present

## 2017-02-24 DIAGNOSIS — J181 Lobar pneumonia, unspecified organism: Secondary | ICD-10-CM

## 2017-02-24 DIAGNOSIS — E785 Hyperlipidemia, unspecified: Secondary | ICD-10-CM | POA: Diagnosis present

## 2017-02-24 DIAGNOSIS — Z7984 Long term (current) use of oral hypoglycemic drugs: Secondary | ICD-10-CM | POA: Diagnosis not present

## 2017-02-24 DIAGNOSIS — J449 Chronic obstructive pulmonary disease, unspecified: Secondary | ICD-10-CM | POA: Diagnosis not present

## 2017-02-24 DIAGNOSIS — F419 Anxiety disorder, unspecified: Secondary | ICD-10-CM | POA: Diagnosis present

## 2017-02-24 DIAGNOSIS — Z886 Allergy status to analgesic agent status: Secondary | ICD-10-CM

## 2017-02-24 DIAGNOSIS — A419 Sepsis, unspecified organism: Principal | ICD-10-CM | POA: Diagnosis present

## 2017-02-24 DIAGNOSIS — F17210 Nicotine dependence, cigarettes, uncomplicated: Secondary | ICD-10-CM | POA: Diagnosis not present

## 2017-02-24 DIAGNOSIS — R079 Chest pain, unspecified: Secondary | ICD-10-CM | POA: Diagnosis not present

## 2017-02-24 DIAGNOSIS — Z7951 Long term (current) use of inhaled steroids: Secondary | ICD-10-CM | POA: Diagnosis not present

## 2017-02-24 DIAGNOSIS — R0602 Shortness of breath: Secondary | ICD-10-CM | POA: Diagnosis not present

## 2017-02-24 DIAGNOSIS — Z981 Arthrodesis status: Secondary | ICD-10-CM

## 2017-02-24 DIAGNOSIS — F329 Major depressive disorder, single episode, unspecified: Secondary | ICD-10-CM | POA: Diagnosis present

## 2017-02-24 DIAGNOSIS — Z7982 Long term (current) use of aspirin: Secondary | ICD-10-CM

## 2017-02-24 DIAGNOSIS — Z885 Allergy status to narcotic agent status: Secondary | ICD-10-CM | POA: Diagnosis not present

## 2017-02-24 DIAGNOSIS — J440 Chronic obstructive pulmonary disease with acute lower respiratory infection: Secondary | ICD-10-CM | POA: Diagnosis not present

## 2017-02-24 DIAGNOSIS — J189 Pneumonia, unspecified organism: Secondary | ICD-10-CM | POA: Diagnosis not present

## 2017-02-24 DIAGNOSIS — K219 Gastro-esophageal reflux disease without esophagitis: Secondary | ICD-10-CM | POA: Diagnosis present

## 2017-02-24 DIAGNOSIS — I10 Essential (primary) hypertension: Secondary | ICD-10-CM | POA: Diagnosis present

## 2017-02-24 DIAGNOSIS — Z79899 Other long term (current) drug therapy: Secondary | ICD-10-CM

## 2017-02-24 LAB — BASIC METABOLIC PANEL
Anion gap: 7 (ref 5–15)
BUN: 8 mg/dL (ref 6–20)
CO2: 21 mmol/L — AB (ref 22–32)
Calcium: 6.3 mg/dL — CL (ref 8.9–10.3)
Chloride: 109 mmol/L (ref 101–111)
Creatinine, Ser: 0.35 mg/dL — ABNORMAL LOW (ref 0.44–1.00)
GFR calc Af Amer: >60 mL/min (ref >60)
GFR calc non Af Amer: >60 mL/min (ref >60)
GLUCOSE: 103 mg/dL — AB (ref 65–99)
Potassium: 2.5 mmol/L — CL (ref 3.5–5.1)
Sodium: 137 mmol/L (ref 135–145)

## 2017-02-24 LAB — URINALYSIS, COMPLETE (UACMP) WITH MICROSCOPIC
BILIRUBIN URINE: NEGATIVE
Glucose, UA: NEGATIVE mg/dL
KETONES UR: 20 mg/dL — AB
Leukocytes, UA: NEGATIVE
Nitrite: NEGATIVE
PROTEIN: 30 mg/dL — AB
Specific Gravity, Urine: 1.012 (ref 1.005–1.030)
pH: 5 (ref 5.0–8.0)

## 2017-02-24 LAB — CBC
HEMATOCRIT: 38.4 % (ref 35.0–47.0)
HEMOGLOBIN: 12.8 g/dL (ref 12.0–16.0)
MCH: 29.7 pg (ref 26.0–34.0)
MCHC: 33.4 g/dL (ref 32.0–36.0)
MCV: 88.8 fL (ref 80.0–100.0)
Platelets: 258 10*3/uL (ref 150–440)
RBC: 4.32 MIL/uL (ref 3.80–5.20)
RDW: 14.1 % (ref 11.5–14.5)
WBC: 17.9 10*3/uL — ABNORMAL HIGH (ref 3.6–11.0)

## 2017-02-24 LAB — LIPASE, BLOOD: Lipase: 13 U/L (ref 11–51)

## 2017-02-24 LAB — LACTIC ACID, PLASMA
LACTIC ACID, VENOUS: 1.4 mmol/L (ref 0.5–1.9)
Lactic Acid, Venous: 2.2 mmol/L (ref 0.5–1.9)

## 2017-02-24 LAB — INFLUENZA PANEL BY PCR (TYPE A & B)
INFLBPCR: NEGATIVE
Influenza A By PCR: NEGATIVE

## 2017-02-24 LAB — PROTIME-INR
INR: 1.08
PROTHROMBIN TIME: 14 s (ref 11.4–15.2)

## 2017-02-24 LAB — GLUCOSE, CAPILLARY: GLUCOSE-CAPILLARY: 213 mg/dL — AB (ref 65–99)

## 2017-02-24 LAB — TROPONIN I: Troponin I: <0.03 ng/mL (ref <0.03)

## 2017-02-24 LAB — APTT: APTT: 28 s (ref 24–36)

## 2017-02-24 MED ORDER — SODIUM CHLORIDE 0.9 % IV SOLN
1.0000 g | Freq: Once | INTRAVENOUS | Status: AC
  Administered 2017-02-24: 1 g via INTRAVENOUS
  Filled 2017-02-24: qty 10

## 2017-02-24 MED ORDER — ONDANSETRON HCL 4 MG/2ML IJ SOLN: 4.0000 mg | Freq: Four times a day (QID) | INTRAMUSCULAR | Status: DC | PRN

## 2017-02-24 MED ORDER — ENOXAPARIN SODIUM 40 MG/0.4ML ~~LOC~~ SOLN
40.0000 mg | SUBCUTANEOUS | Status: DC
  Administered 2017-02-25: 40 mg via SUBCUTANEOUS
  Filled 2017-02-24: qty 0.4

## 2017-02-24 MED ORDER — TIOTROPIUM BROMIDE MONOHYDRATE 18 MCG IN CAPS
18.0000 ug | ORAL_CAPSULE | Freq: Every day | RESPIRATORY_TRACT | Status: DC
  Administered 2017-02-25 – 2017-02-26 (×2): 18 ug via RESPIRATORY_TRACT
  Filled 2017-02-24: qty 5

## 2017-02-24 MED ORDER — VANCOMYCIN HCL IN DEXTROSE 750-5 MG/150ML-% IV SOLN
750.0000 mg | Freq: Two times a day (BID) | INTRAVENOUS | Status: DC
  Filled 2017-02-24: qty 150

## 2017-02-24 MED ORDER — METHYLPREDNISOLONE SODIUM SUCC 125 MG IJ SOLR
125.0000 mg | Freq: Once | INTRAMUSCULAR | Status: AC
  Administered 2017-02-24: 125 mg via INTRAVENOUS
  Filled 2017-02-24: qty 2

## 2017-02-24 MED ORDER — ONDANSETRON HCL 4 MG PO TABS
4.0000 mg | ORAL_TABLET | Freq: Four times a day (QID) | ORAL | Status: DC | PRN
Start: 2017-02-24 — End: 2017-02-26

## 2017-02-24 MED ORDER — SODIUM CHLORIDE 0.9 % IV BOLUS (SEPSIS)
1000.0000 mL | Freq: Once | INTRAVENOUS | Status: AC
  Administered 2017-02-24: 1000 mL via INTRAVENOUS

## 2017-02-24 MED ORDER — INSULIN ASPART 100 UNIT/ML ~~LOC~~ SOLN
0.0000 [IU] | Freq: Every day | SUBCUTANEOUS | Status: DC
  Administered 2017-02-25: 2 [IU] via SUBCUTANEOUS
  Filled 2017-02-24: qty 2

## 2017-02-24 MED ORDER — ALBUTEROL SULFATE (2.5 MG/3ML) 0.083% IN NEBU
5.0000 mg | INHALATION_SOLUTION | Freq: Once | RESPIRATORY_TRACT | Status: AC
  Administered 2017-02-24: 5 mg via RESPIRATORY_TRACT
  Filled 2017-02-24: qty 6

## 2017-02-24 MED ORDER — MAGNESIUM SULFATE 2 GM/50ML IV SOLN
2.0000 g | Freq: Once | INTRAVENOUS | Status: AC
  Administered 2017-02-24: 2 g via INTRAVENOUS
  Filled 2017-02-24: qty 50

## 2017-02-24 MED ORDER — PANTOPRAZOLE SODIUM 40 MG PO TBEC
40.0000 mg | DELAYED_RELEASE_TABLET | Freq: Every day | ORAL | Status: DC
  Administered 2017-02-25 – 2017-02-26 (×2): 40 mg via ORAL
  Filled 2017-02-24 (×2): qty 1

## 2017-02-24 MED ORDER — ALBUTEROL SULFATE (2.5 MG/3ML) 0.083% IN NEBU: 2.5000 mg | INHALATION_SOLUTION | Freq: Four times a day (QID) | RESPIRATORY_TRACT | Status: DC | PRN

## 2017-02-24 MED ORDER — VENLAFAXINE HCL ER 75 MG PO CP24
150.0000 mg | ORAL_CAPSULE | Freq: Every day | ORAL | Status: DC
  Administered 2017-02-25 – 2017-02-26 (×2): 150 mg via ORAL
  Filled 2017-02-24 (×2): qty 2

## 2017-02-24 MED ORDER — PRAVASTATIN SODIUM 40 MG PO TABS
80.0000 mg | ORAL_TABLET | Freq: Every evening | ORAL | Status: DC
  Administered 2017-02-25: 80 mg via ORAL
  Filled 2017-02-24: qty 2

## 2017-02-24 MED ORDER — METHYLPREDNISOLONE SODIUM SUCC 125 MG IJ SOLR
60.0000 mg | Freq: Four times a day (QID) | INTRAMUSCULAR | Status: DC
  Administered 2017-02-25 (×2): 60 mg via INTRAVENOUS
  Filled 2017-02-24 (×2): qty 2

## 2017-02-24 MED ORDER — METOPROLOL TARTRATE 25 MG PO TABS
25.0000 mg | ORAL_TABLET | Freq: Two times a day (BID) | ORAL | Status: DC
  Administered 2017-02-25 – 2017-02-26 (×4): 25 mg via ORAL
  Filled 2017-02-24 (×4): qty 1

## 2017-02-24 MED ORDER — AMLODIPINE BESYLATE 10 MG PO TABS
10.0000 mg | ORAL_TABLET | Freq: Every day | ORAL | Status: DC
  Administered 2017-02-25 – 2017-02-26 (×2): 10 mg via ORAL
  Filled 2017-02-24 (×2): qty 1

## 2017-02-24 MED ORDER — MAGNESIUM CITRATE PO SOLN
1.0000 | Freq: Once | ORAL | Status: DC | PRN
  Filled 2017-02-24: qty 296

## 2017-02-24 MED ORDER — SODIUM CHLORIDE 0.9 % IV BOLUS (SEPSIS)
500.0000 mL | Freq: Once | INTRAVENOUS | Status: AC
  Administered 2017-02-24: 500 mL via INTRAVENOUS

## 2017-02-24 MED ORDER — DEXTROSE 5 % IV SOLN
500.0000 mg | INTRAVENOUS | Status: DC
Start: 2017-02-25 — End: 2017-02-25
  Filled 2017-02-24: qty 500

## 2017-02-24 MED ORDER — ALPRAZOLAM 0.5 MG PO TABS
0.5000 mg | ORAL_TABLET | Freq: Every evening | ORAL | Status: DC | PRN
  Administered 2017-02-25 (×2): 0.5 mg via ORAL
  Filled 2017-02-24 (×2): qty 1

## 2017-02-24 MED ORDER — POTASSIUM CHLORIDE CRYS ER 20 MEQ PO TBCR
40.0000 meq | EXTENDED_RELEASE_TABLET | Freq: Once | ORAL | Status: AC
  Administered 2017-02-24: 40 meq via ORAL
  Filled 2017-02-24: qty 2

## 2017-02-24 MED ORDER — DEXTROSE 5 % IV SOLN
500.0000 mg | Freq: Once | INTRAVENOUS | Status: AC
  Administered 2017-02-25: 500 mg via INTRAVENOUS
  Filled 2017-02-24: qty 500

## 2017-02-24 MED ORDER — IPRATROPIUM BROMIDE 0.02 % IN SOLN: 0.5000 mg | Freq: Four times a day (QID) | RESPIRATORY_TRACT | Status: DC | PRN

## 2017-02-24 MED ORDER — ASPIRIN 81 MG PO CHEW
81.0000 mg | CHEWABLE_TABLET | Freq: Every day | ORAL | Status: DC
  Administered 2017-02-25 – 2017-02-26 (×2): 81 mg via ORAL
  Filled 2017-02-24 (×2): qty 1

## 2017-02-24 MED ORDER — SPIRONOLACTONE 25 MG PO TABS
50.0000 mg | ORAL_TABLET | Freq: Every day | ORAL | Status: DC
  Administered 2017-02-25 – 2017-02-26 (×2): 50 mg via ORAL
  Filled 2017-02-24 (×2): qty 2

## 2017-02-24 MED ORDER — SENNOSIDES-DOCUSATE SODIUM 8.6-50 MG PO TABS: 1.0000 | ORAL_TABLET | Freq: Every evening | ORAL | Status: DC | PRN

## 2017-02-24 MED ORDER — PIPERACILLIN-TAZOBACTAM 3.375 G IVPB 30 MIN
3.3750 g | Freq: Once | INTRAVENOUS | Status: AC
  Administered 2017-02-24: 3.375 g via INTRAVENOUS
  Filled 2017-02-24: qty 50

## 2017-02-24 MED ORDER — VANCOMYCIN HCL IN DEXTROSE 1-5 GM/200ML-% IV SOLN
1000.0000 mg | Freq: Once | INTRAVENOUS | Status: AC
  Administered 2017-02-24: 1000 mg via INTRAVENOUS
  Filled 2017-02-24: qty 200

## 2017-02-24 MED ORDER — INSULIN ASPART 100 UNIT/ML ~~LOC~~ SOLN
0.0000 [IU] | Freq: Three times a day (TID) | SUBCUTANEOUS | Status: DC
  Administered 2017-02-25: 2 [IU] via SUBCUTANEOUS
  Administered 2017-02-25 (×2): 3 [IU] via SUBCUTANEOUS
  Administered 2017-02-26: 2 [IU] via SUBCUTANEOUS
  Filled 2017-02-24: qty 3
  Filled 2017-02-24: qty 2
  Filled 2017-02-24: qty 3
  Filled 2017-02-24: qty 2

## 2017-02-24 MED ORDER — DEXTROSE 5 % IV SOLN
1.0000 g | INTRAVENOUS | Status: DC
  Administered 2017-02-25: 1 g via INTRAVENOUS
  Filled 2017-02-24 (×2): qty 10

## 2017-02-24 MED ORDER — PIPERACILLIN-TAZOBACTAM 3.375 G IVPB
3.3750 g | Freq: Three times a day (TID) | INTRAVENOUS | Status: DC
  Filled 2017-02-24 (×2): qty 50

## 2017-02-24 MED ORDER — SODIUM CHLORIDE 0.9 % IV SOLN
INTRAVENOUS | Status: DC
  Administered 2017-02-25 – 2017-02-26 (×3): via INTRAVENOUS

## 2017-02-24 MED ORDER — IPRATROPIUM-ALBUTEROL 0.5-2.5 (3) MG/3ML IN SOLN
3.0000 mL | Freq: Once | RESPIRATORY_TRACT | Status: AC
  Administered 2017-02-24: 3 mL via RESPIRATORY_TRACT
  Filled 2017-02-24: qty 3

## 2017-02-24 MED ORDER — DEXTROSE 5 % IV SOLN
1.0000 g | Freq: Once | INTRAVENOUS | Status: AC
  Administered 2017-02-25: 1 g via INTRAVENOUS
  Filled 2017-02-24: qty 10

## 2017-02-24 NOTE — ED Notes (Signed)
03/06/2017 2035 Test:Potassium, Calcium   Critical Value:K+2.5, Ca 6.3  Name of Provider Notified: Dr. Joni Fears  Dr. Joni Fears acknowledged new orders

## 2017-02-24 NOTE — Progress Notes (Signed)
Pharmacy Antibiotic Note  Suzanne Glenn is a 66 y.o. female admitted on 02/24/2017 with pneumonia.  Pharmacy has been consulted for Ceftriaxone/Azithromycin dosing.  Plan: Patient is being switched from Vanc/zosyn to Ceftriaxone/Azithromycin for CAP pneumonia  Will initiate ceftriaxone 1g IV and azithromycin 500 mg IV daily. Will monitor resolution of s/sx of infection.  Height: '5\' 3"'$  (160 cm) Weight: 174 lb 4.8 oz (79.1 kg) IBW/kg (Calculated) : 52.4  Temp (24hrs), Avg:99.4 F (37.4 C), Min:98.5 F (36.9 C), Max:100.6 F (38.1 C)   Recent Labs Lab 02/24/17 1957 02/24/17 2007 02/24/17 2305  WBC 17.9*  --   --   CREATININE 0.35*  --   --   LATICACIDVEN  --  1.4 2.2*    Estimated Creatinine Clearance: 70.8 mL/min (A) (by C-G formula based on SCr of 0.35 mg/dL (L)).    Allergies  Allergen Reactions  . Diclofenac-Misoprostol Other (See Comments)  . Hydrocodone-Acetaminophen Nausea And Vomiting  . Nsaids     gastritis  . Simvastatin Other (See Comments)    body aches  . Zolpidem Other (See Comments)     Thank you for allowing pharmacy to be a part of this patient's care.  Tobie Lords, PharmD, BCPS Clinical Pharmacist 02/24/2017

## 2017-02-24 NOTE — Progress Notes (Signed)
Critical lab lactic acid 2.2, prime paged awaiting call back, influenza A&B negative, droplet precautions discontinued.

## 2017-02-24 NOTE — ED Triage Notes (Signed)
Pt arrives POV to ED with c/o of SOB and stabbing chest pain on and off since Thursday. Pt's breathing is labored with diminished lung sounds. Pt is coughing in triage and also tachycardic at this time.

## 2017-02-24 NOTE — H&P (Signed)
History and Physical   [facility] [place] [facility] Admission History and Physical [employer], D.O.    Patient Name: Suzanne Glenn MR#: [id number] Date of Birth: 03-24-1951 Date of Admission: 02/24/2017  Referring MD/NP/PA: Dr. Joni Fears Primary Care Physician: Glendon Axe, MD   Chief Complaint:  Chief Complaint  Patient presents with  . Chest Pain  . Shortness of Breath  Please note the entire history is obtained from the patient's emergency department chart, emergency department provider and ;prior records. Patient's personal history is limited by poor historian   HPI: Suzanne Glenn is a 66 y.o. female with a known history of Home O2 dependent COPD, asthma, anxiety, depression, diabetes, headache and hypertension presents to the emergency department for evaluation of shortness of breath.  Patient was in a usual state of health until 3 days ago when she was found to have worsening shortness of breath as well as generalized weakness, nausea, malaise, abdominal pain and diarrhea. Her shortness of breath was refractory to inhalers and was associated with a cough productive of green-yellow sputum. She was unable to take her medications including her inhalers due to her symptoms She also reported in the emergency departments the onset of chest pain intermittently but cannot describe any further  Patient denies fevers/chills,  dizziness, vomiting or constipation, dysuria/frequency, changes in mental status.    EMS/ED Course: Patient received Duonebs, Solumedrol, Ca Gluconate, mag sulfate, KCl, Zosyn, Vanco. In the emergency department she was found to have sepsis secondary to community-acquired pneumonia as well as acute exacerbation of COPD. Medical admission was requested for further inpatient workup and management.  Review of Systems:  CONSTITUTIONAL: Positive fatigue and weakness. No fever/chills, weight gain/loss, headache. EYES: No blurry or double vision. ENT: No  tinnitus, postnasal drip, redness or soreness of the oropharynx. RESPIRATORY: Positive cough, dyspnea, wheeze.  No hemoptysis.  CARDIOVASCULAR: No chest pain, palpitations, syncope, orthopnea. No lower extremity edema.  GASTROINTESTINAL: Positive nausea and abdominal pain, negative diarrhea, constipation.  No hematemesis, melena or hematochezia. GENITOURINARY: No dysuria, frequency, hematuria. ENDOCRINE: No polyuria or nocturia. No heat or cold intolerance. HEMATOLOGY: No anemia, bruising, bleeding. INTEGUMENTARY: No rashes, ulcers, lesions. MUSCULOSKELETAL: No arthritis, gout, dyspnea. NEUROLOGIC: No numbness, tingling, ataxia, seizure-type activity, weakness. PSYCHIATRIC: No anxiety, depression, insomnia.   Past Medical History:  Diagnosis Date  . Anxiety   . Asthma   . COPD (chronic obstructive pulmonary disease) ([place])   . Depression   . Diabetes mellitus, type II ([place])   . Headache   . Hypertension     Past Surgical History:  Procedure Laterality Date  . BACK SURGERY    . cervical bone infusion    . CHOLECYSTECTOMY    . TUBAL LIGATION       reports that she has been smoking Cigarettes.  She started smoking about 41 years ago. She has a 40.00 pack-year smoking history. She has never used smokeless tobacco. She reports that she does not drink alcohol or use drugs.  Allergies  Allergen Reactions  . Diclofenac-Misoprostol Other (See Comments)  . Hydrocodone-Acetaminophen Nausea And Vomiting  . Nsaids     gastritis  . Simvastatin Other (See Comments)    body aches  . Zolpidem Other (See Comments)    Family History  Problem Relation Age of Onset  . Anxiety disorder Mother   . Cancer - Lung Mother   . Depression Sister   . Cancer Sister   . Depression Brother   . Cancer Sister   . Cancer  Sister   . Cancer Sister   . Heart Problems Sister   . Bipolar disorder Sister   . Diabetes Sister   . Cancer - Lung Sister   . Hypertension Sister   . Diabetes Sister   .  Hyperlipidemia Sister   . COPD Sister   . Heart Problems Brother   . Cancer Brother   . Cancer Brother   . Cancer Brother   . Breast cancer Maternal Aunt     Prior to Admission medications   Medication Sig Start Date End Date Taking? Authorizing Provider  ACCU-CHEK FASTCLIX LANCETS [place]  02/18/15   [provider]  ACCU-CHEK SMARTVIEW test strip  02/18/15   [provider]  albuterol (PROVENTIL HFA) 108 (90 BASE) MCG/ACT inhaler Inhale into the lungs. 03/22/14   [provider]  ALPRAZolam Duanne Moron) 0.5 MG tablet Take 1 tablet (0.5 mg total) by mouth at bedtime as needed for anxiety. 12/17/16   Rainey Pines, MD  amLODipine (NORVASC) 10 MG tablet Take 1 tablet by mouth daily.    [provider]  aspirin 81 MG chewable tablet Chew 1 tablet by mouth daily.    [provider]  esomeprazole (NEXIUM) 40 MG capsule Take 40 mg by mouth daily.    [provider]  metFORMIN (GLUCOPHAGE) 500 MG tablet Take 1 tablet by mouth every morning.    [provider]  metoprolol tartrate (LOPRESSOR) 25 MG tablet Take 1 tablet by mouth 2 (two) times daily.    [provider]  potassium chloride (KLOR-CON) 8 MEQ tablet Take 1 tablet by mouth daily.    [provider]  pravastatin (PRAVACHOL) 80 MG tablet Take 1 tablet by mouth every evening.    [provider]  spironolactone (ALDACTONE) 50 MG tablet Take 1 tablet by mouth daily.    [provider]  tiotropium (SPIRIVA) 18 MCG inhalation capsule Place 1 mcg into inhaler and inhale daily.    [provider]  traZODone (DESYREL) 150 MG tablet Take 1 tablet (150 mg total) by mouth at bedtime. 12/17/16   Rainey Pines, MD  venlafaxine XR (EFFEXOR-XR) 150 MG 24 hr capsule Take 1 capsule (150 mg total) by mouth daily with breakfast. 12/17/16   Rainey Pines, MD    Physical Exam: Vitals:   02/24/17 1950 02/24/17 2026 02/24/17 2030 02/24/17 2101  BP: (!) 152/80 (!) 145/72 (!)  143/65 138/62  Pulse: (!) 130 (!) 121 (!) 131 (!) 127  Resp: (!) 30 (!) 26 (!) 24 (!) 29  Temp: (!) 100.6 F (38.1 C) 99 F (37.2 C)    TempSrc: Oral Oral    SpO2: 94% 95% 90% 91%  Weight:      Height:        GENERAL: 66 y.o.-year-old Female patient, well-developed, well-nourished lying in the bed in no acute distress.  Pleasant and cooperative.   HEENT: Head atraumatic, normocephalic. Pupils equal, round, reactive to light and accommodation. No scleral icterus. Extraocular muscles intact. Nares are patent. Oropharynx is clear. Mucus membranes moist. NECK: Supple, full range of motion. No JVD, no bruit heard. No thyroid enlargement, no tenderness, no cervical lymphadenopathy. CHEST: Moderate expiratory wheezing. No use of accessory muscles of respiration.  No reproducible chest wall tenderness.  CARDIOVASCULAR: S1, S2 normal. No murmurs, rubs, or gallops. Cap refill <2 seconds. Pulses intact distally.  ABDOMEN: Soft, nondistended, nontender. No rebound, guarding, rigidity. Normoactive bowel sounds present in all four quadrants. No organomegaly or mass. EXTREMITIES: No pedal edema, cyanosis,  or clubbing. No calf tenderness or Homan's sign.  NEUROLOGIC: The patient is alert and oriented x 3. Cranial nerves II through XII are grossly intact with no focal sensorimotor deficit. Muscle strength 5/5 in all extremities. Sensation intact. Gait not checked. PSYCHIATRIC:  Normal affect, mood, thought content. SKIN: Warm, dry, and intact without obvious rash, lesion, or ulcer.    Labs on Admission:  CBC:  Recent Labs Lab 02/24/17 1957  WBC 17.9*  HGB 12.8  HCT 38.4  MCV 88.8  PLT 951   Basic Metabolic Panel:  Recent Labs Lab 02/24/17 1957  NA 137  K 2.5*  CL 109  CO2 21*  GLUCOSE 103*  BUN 8  CREATININE 0.35*  CALCIUM 6.3*   GFR: Estimated Creatinine Clearance: 70.7 mL/min (A) (by C-G formula based on SCr of 0.35 mg/dL (L)). Liver Function Tests: No results for input(s):  AST, ALT, ALKPHOS, BILITOT, PROT, ALBUMIN in the last 168 hours.  Recent Labs Lab 02/24/17 2007  LIPASE 13   No results for input(s): AMMONIA in the last 168 hours. Coagulation Profile:  Recent Labs Lab 02/24/17 2007  INR 1.08   Cardiac Enzymes:  Recent Labs Lab 02/24/17 1957  TROPONINI <0.03   BNP (last 3 results) No results for input(s): PROBNP in the last 8760 hours. HbA1C: No results for input(s): HGBA1C in the last 72 hours. CBG: No results for input(s): GLUCAP in the last 168 hours. Lipid Profile: No results for input(s): CHOL, HDL, LDLCALC, TRIG, CHOLHDL, LDLDIRECT in the last 72 hours. Thyroid Function Tests: No results for input(s): TSH, T4TOTAL, FREET4, T3FREE, THYROIDAB in the last 72 hours. Anemia Panel: No results for input(s): VITAMINB12, FOLATE, FERRITIN, TIBC, IRON, RETICCTPCT in the last 72 hours. Urine analysis: No results found for: COLORURINE, APPEARANCEUR, LABSPEC, PHURINE, GLUCOSEU, HGBUR, BILIRUBINUR, KETONESUR, PROTEINUR, UROBILINOGEN, NITRITE, LEUKOCYTESUR Sepsis Labs: '@LABRCNTIP'$ (procalcitonin:4,lacticidven:4) )No results found for this or any previous visit (from the past 240 hour(s)).   Radiological Exams on Admission: Dg Chest Port 1 View  Result Date: 02/24/2017 CLINICAL DATA:  Chest pain, shortness of breath EXAM: PORTABLE CHEST 1 VIEW COMPARISON:  CT chest dated 08/01/2012 FINDINGS: Left upper lobe/ lingular opacity, suspicious for pneumonia. Right lung is essentially clear. No pleural effusion or pneumothorax. The heart is normal in size. Cervical spine fixation hardware. IMPRESSION: Left upper lobe/lingular opacity, suspicious for pneumonia. Electronically Signed   By: Julian Hy M.D.   On: 02/24/2017 20:28    EKG: Sinus tachycardia 136 bpm with normal axis, new inferior T-wave inversions  and nonspecific ST-T wave changes.   Assessment/Plan  This is a 66 y.o. female with a history of anxiety, asthma, COPD, depression, DM, HTN  now being admitted with:  #. Sepsis secondary to CAP - Admit to inpatient with telemetry monitoring - IV antibiotics: Rocephin, Azithro - IV fluid hydration, O2, mednebs and expectorants as needed.  - Follow up blood, sputum cultures. Urine legionella and strep Ag.  - Repeat CBC in am.   #. Acute exacerbation of COPD - IV steroids -Continue Spiriva - Nebulizers, O2 therapy and expectorants as needed.  - Continuous pulse oximetry - Consider pulmonary consult if not improving.   #. Hypokalemia - Replace and recheck BMP in AM  #. History of anxiety/depression - Continue Effexor, Xanax  #. History of diabetes - Accuchecks achs with RISS coverage - Heart healthy, carb controlled diet -Hold metformin  #. History of HTN - Continue Norvasc, metoprolol, spironolactone  #. History of GERD - Continue Nexium  #. History  of hyperlipidemia - Continue pravastatin  Admission status: Inpatient IV Fluids: NS Diet/Nutrition: HH, CC Consults called: None  DVT Px: Lovenox, SCDs and early ambulation. Code Status: Full Code  Disposition Plan: To home in 1-2 days  All the records are reviewed and case discussed with ED provider. Management plans discussed with the patient and/or family who express understanding and agree with plan of care.  Royalty Fakhouri D.O. on 02/24/2017 at 9:24 PM Between 7am to 6pm - Pager - [phone] After 6pm go to [email or web address] - [employer] [facility] [phone] CC: Primary care physician; Glendon Axe, MD   02/24/2017, 9:24 PM

## 2017-02-24 NOTE — Progress Notes (Signed)
Pharmacy Antibiotic Note  Suzanne Glenn is a 66 y.o. female admitted on 02/24/2017 with sepsis.  Pharmacy has been consulted for vancomycin and Zosyn dosing.  Plan: 1. Zosyn 3.375 gm IV Q8H EI 2. Vancomycin 1 gm IV x 1 in ED followed by vancomycin 750 mg IV Q12H, predicted trough 19 mcg/mL. Pharmacy will continue to follow and adjust as needed to maintain trough 15 to 20 mcg/mL.   Vd 36.3 L, Ke 0.063 hr-1, T1/2 11 hr  Height: '5\' 3"'$  (160 cm) Weight: 174 lb (78.9 kg) IBW/kg (Calculated) : 52.4  Temp (24hrs), Avg:99.9 F (37.7 C), Min:99 F (37.2 C), Max:100.6 F (38.1 C)   Recent Labs Lab 02/24/17 1957 02/24/17 2007  WBC 17.9*  --   CREATININE 0.35*  --   LATICACIDVEN  --  1.4    Estimated Creatinine Clearance: 70.7 mL/min (A) (by C-G formula based on SCr of 0.35 mg/dL (L)).    Allergies  Allergen Reactions  . Diclofenac-Misoprostol Other (See Comments)  . Hydrocodone-Acetaminophen Nausea And Vomiting  . Nsaids     gastritis  . Simvastatin Other (See Comments)    body aches  . Zolpidem Other (See Comments)    Thank you for allowing pharmacy to be a part of this patient's care.  Laural Benes, Pharm.D., BCPS Clinical Pharmacist 02/24/2017 9:00 PM

## 2017-02-24 NOTE — ED Notes (Signed)
X-ray at bedside

## 2017-02-24 NOTE — ED Notes (Signed)
Pt placed in gown and on cardiac monitor.

## 2017-02-24 NOTE — ED Provider Notes (Signed)
[facility] Emergency Department Provider Note  ____________________________________________  Time seen: Approximately 8:06 PM  I have reviewed the triage vital signs and the nursing notes.   HISTORY  Chief Complaint Chest Pain and Shortness of Breath  Level 5 caveat:  Portions of the history and physical were unable to be obtained due to the patient's acute illness and poor historian   HPI Suzanne Glenn is a 66 y.o. female who reports worsening shortness of breath and productive cough for the past 3 days. Denies chest pain. Reports a recent nonlocalized abdominal pain with diarrhea over the past week. He reports that she's not been able to take her medications today due to nausea and feeling ill, including her metoprolol to control her heart rate.  Chronically on 2 L nasal cannula at home.     Past Medical History:  Diagnosis Date  . Anxiety   . Asthma   . COPD (chronic obstructive pulmonary disease) ([place])   . Depression   . Diabetes mellitus, type II ([place])   . Headache   . Hypertension      Patient Active Problem List   Diagnosis Date Noted  . Essential (primary) hypertension 08/29/2015  . Controlled type 2 diabetes mellitus without complication ([place]) 46/96/2952  . Combined fat and carbohydrate induced hyperlipemia 06/30/2015  . Clinical depression 05/05/2015  . Anxiety, generalized 05/05/2015  . Depression, major, recurrent, moderate ([place]) 05/05/2015  . Benign essential HTN 04/05/2015  . Diabetes mellitus, type 2 ([place]) 02/09/2015  . Type 2 diabetes mellitus ([place]) 02/09/2015  . Cannot sleep 01/24/2015  . Fibromyalgia 01/24/2015  . CAFL (chronic airflow limitation) ([place]) 01/24/2015  . H/O diabetes mellitus 01/24/2015  . H/O hypercholesterolemia 01/24/2015  . H/O: HTN (hypertension) 01/24/2015  . Coronary artery disease 01/24/2015  . Hypokalemia 01/24/2015  . Arteriosclerosis of coronary artery 10/04/2014  . Chest pain 10/04/2014  .  3-vessel CAD 08/02/2014  . Acute chest pain 06/25/2014  . Breath shortness 06/25/2014  . COPD, moderate ([place]) 06/08/2014  . Moderate COPD (chronic obstructive pulmonary disease) ([place]) 06/08/2014  . Gastro-esophageal reflux disease without esophagitis 05/31/2014     Past Surgical History:  Procedure Laterality Date  . BACK SURGERY    . cervical bone infusion    . CHOLECYSTECTOMY    . TUBAL LIGATION       Prior to Admission medications   Medication Sig Start Date End Date Taking? Authorizing Provider  ACCU-CHEK FASTCLIX LANCETS [place]  02/18/15   [provider]  ACCU-CHEK SMARTVIEW test strip  02/18/15   [provider]  albuterol (PROVENTIL HFA) 108 (90 BASE) MCG/ACT inhaler Inhale into the lungs. 03/22/14   [provider]  ALPRAZolam Duanne Moron) 0.5 MG tablet Take 1 tablet (0.5 mg total) by mouth at bedtime as needed for anxiety. 12/17/16   Rainey Pines, MD  amLODipine (NORVASC) 10 MG tablet Take 1 tablet by mouth daily.    [provider]  aspirin 81 MG chewable tablet Chew 1 tablet by mouth daily.    [provider]  esomeprazole (NEXIUM) 40 MG capsule Take 40 mg by mouth daily.    [provider]  metFORMIN (GLUCOPHAGE) 500 MG tablet Take 1 tablet by mouth every morning.    [provider]  metoprolol tartrate (LOPRESSOR) 25 MG tablet Take 1 tablet by mouth 2 (two) times daily.    [provider]  potassium chloride (KLOR-CON) 8 MEQ tablet Take 1 tablet by mouth daily.    [provider]  pravastatin (PRAVACHOL) 80 MG tablet Take 1 tablet by mouth every evening.    [provider]  spironolactone (ALDACTONE) 50 MG tablet Take 1 tablet by mouth daily.    [provider]  tiotropium (SPIRIVA) 18 MCG inhalation capsule Place 1 mcg into inhaler and inhale daily.    [provider]  traZODone (DESYREL) 150 MG tablet Take 1 tablet (150 mg total) by mouth at bedtime. 12/17/16   Rainey Pines, MD   venlafaxine XR (EFFEXOR-XR) 150 MG 24 hr capsule Take 1 capsule (150 mg total) by mouth daily with breakfast. 12/17/16   Rainey Pines, MD     Allergies Diclofenac-misoprostol; Hydrocodone-acetaminophen; Nsaids; Simvastatin; and Zolpidem   Family History  Problem Relation Age of Onset  . Anxiety disorder Mother   . Cancer - Lung Mother   . Depression Sister   . Cancer Sister   . Depression Brother   . Cancer Sister   . Cancer Sister   . Cancer Sister   . Heart Problems Sister   . Bipolar disorder Sister   . Diabetes Sister   . Cancer - Lung Sister   . Hypertension Sister   . Diabetes Sister   . Hyperlipidemia Sister   . COPD Sister   . Heart Problems Brother   . Cancer Brother   . Cancer Brother   . Cancer Brother   . Breast cancer Maternal Aunt     Social History Social History  Substance Use Topics  . Smoking status: Current Every Day Smoker    Packs/day: 1.00    Years: 40.00    Types: Cigarettes    Start date: 05/05/1975  . Smokeless tobacco: Never Used  . Alcohol use No     Comment: socially    Review of Systems  Constitutional:   Positive fever..  ENT:   No sore throat. No rhinorrhea. Lymphatic: No swollen glands, No extremity swelling Endocrine: No hot/cold flashes. No significant weight change. No neck swelling. Cardiovascular:   No chest pain or syncope. Respiratory:   Positive shortness of breath and productive cough. Gastrointestinal:   Positive vague abdominal pain with diarrhea. No vomiting..  Genitourinary:   Negative for dysuria or difficulty urinating. Musculoskeletal:   Negative for focal pain or swelling Neurological:   Negative for headaches or weakness. All other systems reviewed and are negative except as documented above in ROS and HPI.  ____________________________________________   PHYSICAL EXAM:  VITAL SIGNS: ED Triage Vitals  Enc Vitals Group     BP 02/24/17 1938 132/80     Pulse Rate 02/24/17 1938 (!) 135     Resp 02/24/17  1938 20     Temp 02/24/17 1938 100.1 F (37.8 C)     Temp Source 02/24/17 1938 Oral     SpO2 02/24/17 1938 92 %     Weight 02/24/17 1938 174 lb (78.9 kg)     Height 02/24/17 1938 '5\' 3"'$  (1.6 m)     Head Circumference --      Peak Flow --      Pain Score 02/24/17 1941 0     Pain Loc --      Pain Edu? --      Excl. in [place]? --     Vital signs reviewed, nursing assessments reviewed.   Constitutional:   Alert and oriented. Mild respiratory distress. Eyes:   No scleral icterus. No conjunctival pallor. PERRL. EOMI.  No nystagmus. ENT   Head:   Normocephalic and atraumatic.   Nose:  No congestion/rhinnorhea. No septal hematoma   Mouth/Throat:   MMM, no pharyngeal erythema. No peritonsillar mass.    Neck:   No stridor. No SubQ emphysema. No meningismus. Hematological/Lymphatic/Immunilogical:   No cervical lymphadenopathy. Cardiovascular:   Tachycardia heart rate 135. Symmetric bilateral radial and DP pulses.  No murmurs.  Respiratory:  Tachypnea, respiratory rate of about 25. Diffuse expiratory wheezing with prolonged expiratory phase. Symmetric air entry in all lung fields. No focal crackles.. Gastrointestinal:   Soft and nontender. Non distended. There is no CVA tenderness.  No rebound, rigidity, or guarding. Genitourinary:   deferred Musculoskeletal:   Normal range of motion in all extremities. No joint effusions.  No lower extremity tenderness.  No edema. Neurologic:   Normal speech and language.  CN 2-10 normal. Motor grossly intact. No gross focal neurologic deficits are appreciated.  Skin:    Skin is warm, dry and intact. No rash noted.  No petechiae, purpura, or bullae.  ____________________________________________    LABS (pertinent positives/negatives) (all labs ordered are listed, but only abnormal results are displayed) Labs Reviewed  BASIC METABOLIC PANEL - Abnormal; Notable for the following:       Result Value   Potassium 2.5 (*)    CO2 21 (*)     Glucose, Bld 103 (*)    Creatinine, Ser 0.35 (*)    Calcium 6.3 (*)    All other components within normal limits  CBC - Abnormal; Notable for the following:    WBC 17.9 (*)    All other components within normal limits  CULTURE, BLOOD (ROUTINE X 2)  CULTURE, BLOOD (ROUTINE X 2)  URINE CULTURE  TROPONIN I  LACTIC ACID, PLASMA  LIPASE, BLOOD  APTT  PROTIME-INR  LACTIC ACID, PLASMA  URINALYSIS, COMPLETE (UACMP) WITH MICROSCOPIC   ____________________________________________   EKG  Interpreted by me Sinus tachycardia rate 136, normal axis and intervals. Poor R-wave progression. Normal ST segments. T wave inversions in inferior leads. T-wave inversions are new compared to October 2015.  ____________________________________________    RADIOLOGY  Dg Chest Port 1 View  Result Date: 02/24/2017 CLINICAL DATA:  Chest pain, shortness of breath EXAM: PORTABLE CHEST 1 VIEW COMPARISON:  CT chest dated 08/01/2012 FINDINGS: Left upper lobe/ lingular opacity, suspicious for pneumonia. Right lung is essentially clear. No pleural effusion or pneumothorax. The heart is normal in size. Cervical spine fixation hardware. IMPRESSION: Left upper lobe/lingular opacity, suspicious for pneumonia. Electronically Signed   By: Julian Hy M.D.   On: 02/24/2017 20:28    ____________________________________________   PROCEDURES Procedures CRITICAL CARE Performed by: Joni Fears, Arrington Bencomo   Total critical care time: 35 minutes  Critical care time was exclusive of separately billable procedures and treating other patients.  Critical care was necessary to treat or prevent imminent or life-threatening deterioration.  Critical care was time spent personally by me on the following activities: development of treatment plan with patient and/or surrogate as well as nursing, discussions with consultants, evaluation of patient's response to treatment, examination of patient, obtaining history from patient or  surrogate, ordering and performing treatments and interventions, ordering and review of laboratory studies, ordering and review of radiographic studies, pulse oximetry and re-evaluation of patient's condition.  ____________________________________________   INITIAL IMPRESSION / ASSESSMENT AND PLAN / ED COURSE  Pertinent labs & imaging results that were available during my care of the patient were reviewed by me and considered in my medical decision making (see chart for details).  Patient presents with fever tachycardia tachypnea, likely pneumonia and  COPD exacerbation. Code sepsis initiated immediately upon initial assessment. We'll follow-up labs. Fluid boluses, empiric vancomycin and Zosyn. Plan for hospitalization. Also give bronchodilators and steroids for control of COPD symptoms.     Clinical Course as of Feb 24 2114  Nancy Fetter Feb 24, 2017  2043 Cxr c/w pna. Cont resus. Replete electrolytes  [PS]    Clinical Course User Index [PS] Carrie Mew, MD     ----------------------------------------- 9:14 PM on 02/24/2017 -----------------------------------------  Blood pressure remained stable. Patient symptomatically improving with treatment. Case discussed with hospitalist for admission.  ____________________________________________   FINAL CLINICAL IMPRESSION(S) / ED DIAGNOSES  Final diagnoses:  COPD exacerbation ([place])  Community acquired pneumonia of left upper lobe of lung ([place])  Sepsis, due to unspecified organism ([place])  Hypokalemia  Hypocalcemia      New Prescriptions   No medications on file     Portions of this note were generated with dragon dictation software. Dictation errors may occur despite best attempts at proofreading.    Carrie Mew, MD 02/24/17 2115

## 2017-02-24 NOTE — ED Notes (Signed)
Pt reports shortness of breath since Thursday reports she used her inhalers and no improvement reports felt some chills maybe fever did not check temperature. Pt has productive cough, sputum green in color. Reports today did not take any medications and did not do any breathing treatments. Pt talks in complete sentences no distress noted.

## 2017-02-25 LAB — BASIC METABOLIC PANEL
Anion gap: 7 (ref 5–15)
BUN: 10 mg/dL (ref 6–20)
CALCIUM: 8.6 mg/dL — AB (ref 8.9–10.3)
CHLORIDE: 102 mmol/L (ref 101–111)
CO2: 26 mmol/L (ref 22–32)
CREATININE: 0.86 mg/dL (ref 0.44–1.00)
GFR calc Af Amer: >60 mL/min (ref >60)
GFR calc non Af Amer: >60 mL/min (ref >60)
GLUCOSE: 320 mg/dL — AB (ref 65–99)
Potassium: 4.3 mmol/L (ref 3.5–5.1)
Sodium: 135 mmol/L (ref 135–145)

## 2017-02-25 LAB — STREP PNEUMONIAE URINARY ANTIGEN: Strep Pneumo Urinary Antigen: NEGATIVE

## 2017-02-25 LAB — CBC
HCT: 35.1 % (ref 35.0–47.0)
Hemoglobin: 11.5 g/dL — ABNORMAL LOW (ref 12.0–16.0)
MCH: 29.5 pg (ref 26.0–34.0)
MCHC: 32.9 g/dL (ref 32.0–36.0)
MCV: 89.8 fL (ref 80.0–100.0)
Platelets: 254 10*3/uL (ref 150–440)
RBC: 3.9 MIL/uL (ref 3.80–5.20)
RDW: 14.2 % (ref 11.5–14.5)
WBC: 14.9 10*3/uL — ABNORMAL HIGH (ref 3.6–11.0)

## 2017-02-25 LAB — PROTIME-INR
INR: 1.16
PROTHROMBIN TIME: 14.9 s (ref 11.4–15.2)

## 2017-02-25 LAB — GLUCOSE, CAPILLARY
GLUCOSE-CAPILLARY: 189 mg/dL — AB (ref 65–99)
GLUCOSE-CAPILLARY: 150 mg/dL — AB (ref 65–99)
Glucose-Capillary: 166 mg/dL — ABNORMAL HIGH (ref 65–99)
Glucose-Capillary: 161 mg/dL — ABNORMAL HIGH (ref 65–99)

## 2017-02-25 LAB — APTT: aPTT: 33 seconds (ref 24–36)

## 2017-02-25 LAB — MAGNESIUM: MAGNESIUM: 2.2 mg/dL (ref 1.7–2.4)

## 2017-02-25 LAB — LACTIC ACID, PLASMA: Lactic Acid, Venous: 2.4 mmol/L (ref 0.5–1.9)

## 2017-02-25 LAB — PHOSPHORUS: PHOSPHORUS: 2.2 mg/dL — AB (ref 2.5–4.6)

## 2017-02-25 LAB — PROCALCITONIN: Procalcitonin: 0.35 ng/mL

## 2017-02-25 MED ORDER — AZITHROMYCIN 250 MG PO TABS
250.0000 mg | ORAL_TABLET | Freq: Every day | ORAL | Status: DC
  Administered 2017-02-25 – 2017-02-26 (×2): 250 mg via ORAL
  Filled 2017-02-25 (×2): qty 1

## 2017-02-25 MED ORDER — GUAIFENESIN ER 600 MG PO TB12
600.0000 mg | ORAL_TABLET | Freq: Two times a day (BID) | ORAL | Status: DC
  Administered 2017-02-25 – 2017-02-26 (×3): 600 mg via ORAL
  Filled 2017-02-25 (×3): qty 1

## 2017-02-25 MED ORDER — HEPARIN SODIUM (PORCINE) 5000 UNIT/ML IJ SOLN
5000.0000 [IU] | Freq: Three times a day (TID) | INTRAMUSCULAR | Status: DC
  Administered 2017-02-25 – 2017-02-26 (×2): 5000 [IU] via SUBCUTANEOUS
  Filled 2017-02-25 (×2): qty 1

## 2017-02-25 MED ORDER — TRAZODONE HCL 50 MG PO TABS
150.0000 mg | ORAL_TABLET | Freq: Every day | ORAL | Status: DC
  Administered 2017-02-25: 150 mg via ORAL
  Filled 2017-02-25: qty 1

## 2017-02-25 MED ORDER — METFORMIN HCL 500 MG PO TABS
500.0000 mg | ORAL_TABLET | Freq: Every day | ORAL | Status: DC
  Administered 2017-02-25 – 2017-02-26 (×2): 500 mg via ORAL
  Filled 2017-02-25 (×2): qty 1

## 2017-02-25 MED ORDER — METHYLPREDNISOLONE SODIUM SUCC 125 MG IJ SOLR
60.0000 mg | INTRAMUSCULAR | Status: DC
  Administered 2017-02-26: 60 mg via INTRAVENOUS
  Filled 2017-02-25: qty 2

## 2017-02-25 MED ORDER — GUAIFENESIN 100 MG/5ML PO SOLN
10.0000 mL | Freq: Four times a day (QID) | ORAL | Status: DC | PRN
  Administered 2017-02-25: 200 mg via ORAL
  Filled 2017-02-25 (×2): qty 10

## 2017-02-25 MED ORDER — POTASSIUM CHLORIDE ER 8 MEQ PO TBCR
8.0000 meq | EXTENDED_RELEASE_TABLET | Freq: Every day | ORAL | Status: DC
  Administered 2017-02-25 – 2017-02-26 (×2): 8 meq via ORAL
  Filled 2017-02-25 (×2): qty 1

## 2017-02-25 MED ORDER — DM-GUAIFENESIN ER 30-600 MG PO TB12
1.0000 | ORAL_TABLET | Freq: Two times a day (BID) | ORAL | Status: DC
Start: 2017-02-25 — End: 2017-02-25

## 2017-02-25 MED ORDER — DEXTROMETHORPHAN POLISTIREX ER 30 MG/5ML PO SUER
30.0000 mg | Freq: Two times a day (BID) | ORAL | Status: DC
  Administered 2017-02-25 – 2017-02-26 (×3): 30 mg via ORAL
  Filled 2017-02-25 (×6): qty 5

## 2017-02-25 NOTE — Plan of Care (Signed)
Problem: Safety: Goal: Ability to remain free from injury will improve Outcome: Progressing Fall precautions in place, non skid socks  Problem: Pain Managment: Goal: General experience of comfort will improve Outcome: Progressing Prn medications  Problem: Physical Regulation: Goal: Will remain free from infection Outcome: Not Progressing IV/PO antibotics  Problem: Tissue Perfusion: Goal: Risk factors for ineffective tissue perfusion will decrease Outcome: Progressing SQ Lovenox  Problem: Activity: Goal: Ability to tolerate increased activity will improve Outcome: Not Progressing Remains SOB on exertion  Problem: Education: Goal: Knowledge of the prescribed therapeutic regimen will improve Outcome: Progressing IV/Po antibiotics  Problem: Respiratory: Goal: Respiratory status will improve Outcome: Not Progressing Remains on 4LO2 per [place]

## 2017-02-25 NOTE — Care Management (Signed)
Patient does not feel like she needs home health physical therapy.  " I do not walk that much and my mobile home is not that big."  She does agree to home health nurse and no agency preference.  Chronic 02 through [place]. Lives with her husband is is current with pcp- Dr Glendon Axe.  No issues accessing medical care, transportation or obtaining medications

## 2017-02-25 NOTE — Evaluation (Signed)
Physical Therapy Evaluation Patient Details Name: Suzanne Glenn MRN: [id number] DOB: [birth year] Today's Date: 02/25/2017   History of Present Illness  Pt is a 66 y.o.femalewith a history of anxiety, asthma, COPD, depression, DM, HTNnow being admitted with sepsis secondary to CAP, acute COPD exacerbation, and hypokalemia.    Clinical Impression  Pt presents with mild deficits in strength, transfers, gait, and balance, and moderate deficits in activity tolerance.  Pt Ind with bed mobility tasks and CGA/SBA with transfers with good initial stability.  Pt able to amb 60' with RW and CGA.  Slow cadence with gait with some L/R drifting with SpO2 dropping from 93% at baseline to 85% on 2LO2/min.  SpO2 returned to >/= 92% in less than 60 sec after returning to sitting.  Pt will benefit from PT services to address above deficits for decreased caregiver assistance upon discharge at which time pt will benefit from HHPT to continue to safely progress towards return to PLOF.       Follow Up Recommendations Home health PT    Equipment Recommendations  Rolling walker with 5" wheels (May not require, depends on progress)    Recommendations for Other Services       Precautions / Restrictions Precautions Precautions: Fall Restrictions Weight Bearing Restrictions: No      Mobility  Bed Mobility Overal bed mobility: Independent                Transfers Overall transfer level: Needs assistance Equipment used: Rolling walker (2 wheeled) Transfers: Sit to/from Stand Sit to Stand: Min guard            Ambulation/Gait Ambulation/Gait assistance: Min guard Ambulation Distance (Feet): 60 Feet Assistive device: Rolling walker (2 wheeled) Gait Pattern/deviations: Decreased step length - right;Decreased step length - left;Step-through pattern;Trunk flexed;Drifts right/left   Gait velocity interpretation: Below normal speed for age/gender General Gait Details: Slow cadence with gait  with some L/R drifting with SpO2 dropping from 93% at baseline to 85% on 2LO2/min.  SpO2 returned to >/= 92% in less than 60 sec after returning to sitting.   Stairs            Wheelchair Mobility    Modified Rankin (Stroke Patients Only)       Balance Overall balance assessment: Needs assistance Sitting-balance support: Feet supported;No upper extremity supported Sitting balance-Leahy Scale: Good     Standing balance support: No upper extremity supported Standing balance-Leahy Scale: Good                               Pertinent Vitals/Pain Pain Assessment: No/denies pain    Home Living Family/patient expects to be discharged to:: Private residence Living Arrangements: Spouse/significant other Available Help at Discharge: Family;Available 24 hours/day Type of Home: Mobile home Home Access: Stairs to enter Entrance Stairs-Rails: Left;Right;Can reach both Entrance Stairs-Number of Steps: 24 (Pt and pt's spouse report 24 steps into mobile home but very small 3-4" steps. ) Home Layout: One level Home Equipment: Cane - single point      Prior Function Level of Independence: Independent         Comments: Ind amb without AD limited community distances, no fall history, Ind with ADLs     Hand Dominance        Extremity/Trunk Assessment   Upper Extremity Assessment Upper Extremity Assessment: Overall WFL for tasks assessed    Lower Extremity Assessment Lower Extremity Assessment: Generalized weakness  Communication   Communication: No difficulties  Cognition Arousal/Alertness: Awake/alert Behavior During Therapy: WFL for tasks assessed/performed Overall Cognitive Status: Within Functional Limits for tasks assessed                                        General Comments      Exercises Total Joint Exercises Ankle Circles/Pumps: AROM;Both;10 reps Quad Sets: Strengthening;Both;10 reps Hip ABduction/ADduction:  AROM;Both;10 reps Straight Leg Raises: AROM;Both;10 reps Long Arc Quad: AROM;Both;10 reps Knee Flexion: AROM;Both;10 reps Marching in Standing: AROM;Both;10 reps (In sitting)   Assessment/Plan    PT Assessment Patient needs continued PT services  PT Problem List Decreased strength;Decreased activity tolerance;Decreased balance;Decreased knowledge of use of DME       PT Treatment Interventions DME instruction;Gait training;Stair training;Functional mobility training;Neuromuscular re-education;Balance training;Therapeutic exercise;Therapeutic activities;Patient/family education    PT Goals (Current goals can be found in the Care Plan section)  Acute Rehab PT Goals Patient Stated Goal: Get back to taking care of myself PT Goal Formulation: With patient Time For Goal Achievement: 03/10/17 Potential to Achieve Goals: Good    Frequency Min 2X/week   Barriers to discharge        Co-evaluation               AM-PAC PT "6 Clicks" Daily Activity  Outcome Measure Difficulty turning over in bed (including adjusting bedclothes, sheets and blankets)?: None Difficulty moving from lying on back to sitting on the side of the bed? : None Difficulty sitting down on and standing up from a chair with arms (e.g., wheelchair, bedside commode, etc,.)?: None Help needed moving to and from a bed to chair (including a wheelchair)?: A Little Help needed walking in hospital room?: A Little Help needed climbing 3-5 steps with a railing? : A Lot 6 Click Score: 20    End of Session Equipment Utilized During Treatment: Gait belt;Oxygen Activity Tolerance: Patient limited by fatigue Patient left: in chair;with chair alarm set;with call bell/phone within reach Nurse Communication: Mobility status PT Visit Diagnosis: Muscle weakness (generalized) (M62.81);Difficulty in walking, not elsewhere classified (R26.2)    Time: 0935-1010 PT Time Calculation (min) (ACUTE ONLY): 35 min   Charges:   PT  Evaluation $PT Eval Low Complexity: 1 Procedure PT Treatments $Therapeutic Exercise: 8-22 mins   PT G Codes:        DRoyetta Asal PT, DPT 02/25/17, 1:21 PM

## 2017-02-25 NOTE — Progress Notes (Signed)
Dr. Ara Kussmaul returned page no new orders at this time for increase in lactic acid.

## 2017-02-25 NOTE — Progress Notes (Signed)
[facility] at [place] NAME: Suzanne Glenn    MR#:  [id number]  DATE OF BIRTH:  [birth year]  SUBJECTIVE:   Came in with increasing SOB and cough,productive. Reports feeling better today Ate good  BF REVIEW OF SYSTEMS:   Review of Systems  Constitutional: Negative for chills, fever and weight loss.  HENT: Negative for ear discharge, ear pain and nosebleeds.   Eyes: Negative for blurred vision, pain and discharge.  Respiratory: Positive for cough, sputum production and shortness of breath. Negative for wheezing and stridor.   Cardiovascular: Negative for chest pain, palpitations, orthopnea and PND.  Gastrointestinal: Negative for abdominal pain, diarrhea, nausea and vomiting.  Genitourinary: Negative for frequency and urgency.  Musculoskeletal: Negative for back pain and joint pain.  Neurological: Positive for weakness. Negative for sensory change, speech change and focal weakness.  Psychiatric/Behavioral: Negative for depression and hallucinations. The patient is not nervous/anxious.    Tolerating Diet:yes Tolerating PT: pending  DRUG ALLERGIES:   Allergies  Allergen Reactions  . Diclofenac-Misoprostol Other (See Comments)  . Hydrocodone-Acetaminophen Nausea And Vomiting  . Nsaids     gastritis  . Simvastatin Other (See Comments)    body aches  . Zolpidem Other (See Comments)    VITALS:  Blood pressure 120/76, pulse 80, temperature 98.3 F (36.8 C), temperature source Oral, resp. rate 17, height '5\' 3"'$  (1.6 m), weight 79.1 kg (174 lb 4.8 oz), SpO2 96 %.  PHYSICAL EXAMINATION:   Physical Exam  GENERAL:  66 y.o.-year-old patient lying in the bed with no acute distress.  EYES: Pupils equal, round, reactive to light and accommodation. No scleral icterus. Extraocular muscles intact.  HEENT: Head atraumatic, normocephalic. Oropharynx and nasopharynx clear.  NECK:  Supple, no jugular venous distention. No thyroid enlargement,  no tenderness.  LUNGS: distant breath sounds bilaterally, no wheezing, rales, rhonchi. No use of accessory muscles of respiration.  CARDIOVASCULAR: S1, S2 normal. No murmurs, rubs, or gallops.  ABDOMEN: Soft, nontender, nondistended. Bowel sounds present. No organomegaly or mass.  EXTREMITIES: No cyanosis, clubbing or edema b/l.    NEUROLOGIC: Cranial nerves II through XII are intact. No focal Motor or sensory deficits b/l.   PSYCHIATRIC:  patient is alert and oriented x 3.  SKIN: No obvious rash, lesion, or ulcer.   LABORATORY PANEL:  CBC  Recent Labs Lab 02/25/17 0211  WBC 14.9*  HGB 11.5*  HCT 35.1  PLT 254    Chemistries   Recent Labs Lab 02/25/17 0211  NA 135  K 4.3  CL 102  CO2 26  GLUCOSE 320*  BUN 10  CREATININE 0.86  CALCIUM 8.6*  MG 2.2   Cardiac Enzymes  Recent Labs Lab 02/24/17 1957  TROPONINI <0.03   RADIOLOGY:  Dg Chest Port 1 View  Result Date: 02/24/2017 CLINICAL DATA:  Chest pain, shortness of breath EXAM: PORTABLE CHEST 1 VIEW COMPARISON:  CT chest dated 08/01/2012 FINDINGS: Left upper lobe/ lingular opacity, suspicious for pneumonia. Right lung is essentially clear. No pleural effusion or pneumothorax. The heart is normal in size. Cervical spine fixation hardware. IMPRESSION: Left upper lobe/lingular opacity, suspicious for pneumonia. Electronically Signed   By: Julian Hy M.D.   On: 02/24/2017 20:28   ASSESSMENT AND PLAN:  66 y.o. female with a history of anxiety, asthma, COPD, depression, DM, HTN now being admitted with:  #. Sepsis secondary to CAP, left UL - IV antibiotics: Rocephin, Azithro - IV fluid hydration, O2, mednebs and expectorants as needed.  -  Follow up blood, sputum cultures. Urine legionella and strep Ag.  -wbc 17--14K  #. Acute exacerbation of COPD - IV steroids -Continue Spiriva - Nebulizers, O2 therapy and expectorants as needed.  - Continuous pulse oximetry   #. Hypokalemia - Replace and recheck BMP in  AM  #. History of anxiety/depression - Continue Effexor, Xanax  #. History of diabetes - Accuchecks achs with RISS coverage - Heart healthy, carb controlled diet -resume metformin  #. History of HTN - Continue Norvasc, metoprolol, spironolactone  #. History of GERD - Continue Nexium  #. History of hyperlipidemia - Continue pravastatin  Case discussed with Care Management/Social Worker. Management plans discussed with the patient, family and they are in agreement.  CODE STATUS: FULL  DVT Prophylaxis: Lovenox  TOTAL TIME TAKING CARE OF THIS PATIENT: *30* minutes.  >50% time spent on counselling and coordination of care  POSSIBLE D/C IN 1-2 DAYS, DEPENDING ON CLINICAL CONDITION.  Note: This dictation was prepared with Dragon dictation along with smaller phrase technology. Any transcriptional errors that result from this process are unintentional.  Jahiem Franzoni M.D on 02/25/2017 at 9:16 AM  Between 7am to 6pm - Pager - [phone]  After 6pm go to [email or web address] - password [email or web address] [facility]  Office  [phone]  CC: Primary care physician; Glendon Axe, MD

## 2017-02-25 NOTE — Progress Notes (Signed)
Paged Dr. Estanislado Pandy for cough medication, new order for robitussin

## 2017-02-25 NOTE — Progress Notes (Signed)
Pt. Arrived via stretcher, transferred to bed with stand by assist. Skin is clean, warm and dry and intact. Skin verified with Vincente Liberty, RN. Tele box called into [facility] verified with Andee Poles, CNA. Pt. A&O. On 4LNC. Influenza swab collected along with urine sample. General room orientation given. Instruction on how to use ascom and call bell system given.

## 2017-02-25 NOTE — Progress Notes (Signed)
Lactic acid 2.4, MD aware, no new orders

## 2017-02-26 LAB — GLUCOSE, CAPILLARY
GLUCOSE-CAPILLARY: 150 mg/dL — AB (ref 65–99)
GLUCOSE-CAPILLARY: 112 mg/dL — AB (ref 65–99)

## 2017-02-26 LAB — URINE CULTURE: Culture: NO GROWTH

## 2017-02-26 LAB — HIV ANTIBODY (ROUTINE TESTING W REFLEX): HIV Screen 4th Generation wRfx: NONREACTIVE

## 2017-02-26 MED ORDER — CEFUROXIME AXETIL 500 MG PO TABS: 500.0000 mg | ORAL_TABLET | Freq: Two times a day (BID) | ORAL | Status: DC

## 2017-02-26 MED ORDER — CEFUROXIME AXETIL 500 MG PO TABS: 500.0000 mg | ORAL_TABLET | Freq: Two times a day (BID) | ORAL | 0 refills | Status: DC

## 2017-02-26 MED ORDER — AZITHROMYCIN 250 MG PO TABS: ORAL_TABLET | ORAL | 0 refills | Status: DC

## 2017-02-26 MED ORDER — IPRATROPIUM BROMIDE 0.02 % IN SOLN: 0.5000 mg | Freq: Four times a day (QID) | RESPIRATORY_TRACT | 12 refills | Status: DC | PRN

## 2017-02-26 MED ORDER — GUAIFENESIN ER 600 MG PO TB12: 600.0000 mg | ORAL_TABLET | Freq: Two times a day (BID) | ORAL | 0 refills | Status: DC

## 2017-02-26 MED ORDER — PREDNISONE 50 MG PO TABS: 50.0000 mg | ORAL_TABLET | Freq: Every day | ORAL | Status: DC

## 2017-02-26 MED ORDER — PREDNISONE 10 MG PO TABS: ORAL_TABLET | ORAL | 0 refills | Status: DC

## 2017-02-26 MED ORDER — DEXTROMETHORPHAN POLISTIREX ER 30 MG/5ML PO SUER: 30.0000 mg | Freq: Two times a day (BID) | ORAL | 0 refills | Status: DC

## 2017-02-26 NOTE — Care Management Important Message (Signed)
Important Message  Patient Details  Name: MARILOU BARNFIELD MRN: [id number] Date of Birth: 1951-05-15   Medicare Important Message Given:  Yes  Signed IM notice given     Katrina Stack, RN 02/26/2017, 9:12 AM

## 2017-02-26 NOTE — Discharge Instructions (Signed)
Continue oxygen and nebulizer as before

## 2017-02-26 NOTE — Progress Notes (Signed)
Discharge instructions explained to pt and pts spouse/ verbalized an understanding / iv removed/ walker delivered to room/ transported off unit via wheelchair.

## 2017-02-26 NOTE — Care Management Note (Signed)
Case Management Note  Patient Details  Name: Suzanne Glenn MRN: [id number] Date of Birth: Jul 30, 1951  Subjective/Objective:                 Discharge today. No agency preference for home health and DME   Action/Plan:   [facility] contacted for home health nurse (patient does not feel she needs physical therapy) and walker.  Expected Discharge Date:                  Expected Discharge Plan:     In-House Referral:     Discharge planning Services     Post Acute Care Choice:    Choice offered to:  Patient  DME Arranged:  [facility] DME Agency:  [place] Arranged:  RN [facility] Agency:  [facility]  Status of Service:  Completed, signed off  If discussed at [place] of Stay Meetings, dates discussed:    Additional Comments:  Katrina Stack, RN 02/26/2017, 11:08 AM

## 2017-02-26 NOTE — Discharge Summary (Addendum)
[facility] at [place] NAME: Suzanne Glenn    MR#:  [id number]  DATE OF BIRTH:  July 20, 1951  DATE OF ADMISSION:  02/24/2017 ADMITTING PHYSICIAN: Harvie Bridge, DO  DATE OF DISCHARGE: 02/26/2017  PRIMARY CARE PHYSICIAN: Glendon Axe, MD    ADMISSION DIAGNOSIS:  Hypocalcemia [E83.51] Hypokalemia [E87.6] COPD exacerbation (HCC) [J44.1] Sepsis, due to unspecified organism ([place]) [A41.9] Community acquired pneumonia of left upper lobe of lung ([place]) [J18.1]  DISCHARGE DIAGNOSIS:    SECONDARY DIAGNOSIS:   Past Medical History:  Diagnosis Date  . Anxiety   . Asthma   . COPD (chronic obstructive pulmonary disease) ([place])   . Depression   . Diabetes mellitus, type II ([place])   . Headache   . Hypertension     HOSPITAL COURSE:   66 y.o.femalewith a history of anxiety, asthma, COPD, depression, DM, HTNnow being admitted with:  #. Sepsis secondary to CAP, left UL - IV antibiotics: Rocephin, Azithro--changed to oral lumbar lordotic - IV fluid hydration, O2, mednebs and expectorants as needed.  - Follow up blood,sputum cultures. Urine legionella and strep Ag negative -wbc 17--14K -Patient nearing baseline. Blood cultures negative urine culture negative  #. Acute exacerbation of COPD - IV steroids---changed to oral steroids -Continue Spiriva - Nebulizers, O2 therapy and expectorants as needed.  - Sats 91-92% on 2 L  #. Hypokalemia - Replaced  #. History of anxiety/depression - Continue Effexor, Xanax  #. History of diabetes - Accuchecks achs with RISS coverage - Heart healthy, carb controlled diet -resume metformin  #. History of HTN - Continue Norvasc, metoprolol, spironolactone  #. History of GERD - Continue Nexium  #. History of hyperlipidemia - Continue pravastatin  PT recommended home health PT and RN. Patient does not want physical therapy should revisit a mobile home with 2 small manages by  herself. Husband agreeable. We'll arrange home health RN CONSULTS OBTAINED:    DRUG ALLERGIES:   Allergies  Allergen Reactions  . Diclofenac-Misoprostol Other (See Comments)  . Hydrocodone-Acetaminophen Nausea And Vomiting  . Nsaids     gastritis  . Simvastatin Other (See Comments)    body aches  . Zolpidem Other (See Comments)    DISCHARGE MEDICATIONS:   Current Discharge Medication List    START taking these medications   Details  azithromycin (ZITHROMAX) 250 MG tablet Take daily as directed Qty: 4 each, Refills: 0    cefUROXime (CEFTIN) 500 MG tablet Take 1 tablet (500 mg total) by mouth 2 (two) times daily with a meal. Qty: 12 tablet, Refills: 0    dextromethorphan (DELSYM) 30 MG/5ML liquid Take 5 mLs (30 mg total) by mouth 2 (two) times daily. Qty: 89 mL, Refills: 0    guaiFENesin (MUCINEX) 600 MG 12 hr tablet Take 1 tablet (600 mg total) by mouth 2 (two) times daily. Qty: 14 tablet, Refills: 0    ipratropium (ATROVENT) 0.02 % nebulizer solution Take 2.5 mLs (0.5 mg total) by nebulization every 6 (six) hours as needed for wheezing or shortness of breath. Qty: 75 mL, Refills: 12    predniSONE (DELTASONE) 10 MG tablet Take 50 mg daily taper by 10 mg then stop Qty: 15 tablet, Refills: 0      CONTINUE these medications which have NOT CHANGED   Details  ACCU-CHEK FASTCLIX LANCETS MISC     ACCU-CHEK SMARTVIEW test strip     albuterol (PROVENTIL HFA) 108 (90 BASE) MCG/ACT inhaler Inhale into the lungs.    ALPRAZolam (XANAX) 0.5  MG tablet Take 1 tablet (0.5 mg total) by mouth at bedtime as needed for anxiety. Qty: 30 tablet, Refills: 2    amLODipine (NORVASC) 10 MG tablet Take 1 tablet by mouth daily.    aspirin 81 MG chewable tablet Chew 1 tablet by mouth daily.    esomeprazole (NEXIUM) 40 MG capsule Take 40 mg by mouth daily.    metFORMIN (GLUCOPHAGE) 500 MG tablet Take 1 tablet by mouth every morning.    metoprolol tartrate (LOPRESSOR) 25 MG tablet Take 1  tablet by mouth 2 (two) times daily.    potassium chloride (KLOR-CON) 8 MEQ tablet Take 1 tablet by mouth daily.    pravastatin (PRAVACHOL) 80 MG tablet Take 1 tablet by mouth every evening.    spironolactone (ALDACTONE) 50 MG tablet Take 1 tablet by mouth daily.    tiotropium (SPIRIVA) 18 MCG inhalation capsule Place 1 mcg into inhaler and inhale daily.    traZODone (DESYREL) 150 MG tablet Take 1 tablet (150 mg total) by mouth at bedtime. Qty: 90 tablet, Refills: 2    venlafaxine XR (EFFEXOR-XR) 150 MG 24 hr capsule Take 1 capsule (150 mg total) by mouth daily with breakfast. Qty: 90 capsule, Refills: 2        If you experience worsening of your admission symptoms, develop shortness of breath, life threatening emergency, suicidal or homicidal thoughts you must seek medical attention immediately by calling 911 or calling your MD immediately  if symptoms less severe.  You Must read complete instructions/literature along with all the possible adverse reactions/side effects for all the Medicines you take and that have been prescribed to you. Take any new Medicines after you have completely understood and accept all the possible adverse reactions/side effects.   Please note  You were cared for by a hospitalist during your hospital stay. If you have any questions about your discharge medications or the care you received while you were in the hospital after you are discharged, you can call the unit and asked to speak with the hospitalist on call if the hospitalist that took care of you is not available. Once you are discharged, your primary care physician will handle any further medical issues. Please note that NO REFILLS for any discharge medications will be authorized once you are discharged, as it is imperative that you return to your primary care physician (or establish a relationship with a primary care physician if you do not have one) for your aftercare needs so that they can reassess your  need for medications and monitor your lab values. Today   SUBJECTIVE    Cough and congestion. No fever VITAL SIGNS:  Blood pressure 125/60, pulse 93, temperature 98 F (36.7 C), temperature source Oral, resp. rate 18, height '5\' 3"'$  (1.6 m), weight 79.1 kg (174 lb 4.8 oz), SpO2 91 %.  I/O:    Intake/Output Summary (Last 24 hours) at 02/26/17 1004 Last data filed at 02/26/17 0848  Gross per 24 hour  Intake             2510 ml  Output             2050 ml  Net              460 ml    PHYSICAL EXAMINATION:  GENERAL:  66 y.o.-year-old patient lying in the bed with no acute distress.  EYES: Pupils equal, round, reactive to light and accommodation. No scleral icterus. Extraocular muscles intact.  HEENT: Head atraumatic, normocephalic. Oropharynx and nasopharynx  clear.  NECK:  Supple, no jugular venous distention. No thyroid enlargement, no tenderness.  LUNGS: Distant breath sounds bilaterally, scattered wheezing, no rales,rhonchi or crepitation. No use of accessory muscles of respiration.  CARDIOVASCULAR: S1, S2 normal. No murmurs, rubs, or gallops.  ABDOMEN: Soft, non-tender, non-distended. Bowel sounds present. No organomegaly or mass.  EXTREMITIES: No pedal edema, cyanosis, or clubbing.  NEUROLOGIC: Cranial nerves II through XII are intact. Muscle strength 5/5 in all extremities. Sensation intact. Gait not checked.  PSYCHIATRIC: The patient is alert and oriented x 3.  SKIN: No obvious rash, lesion, or ulcer.   DATA REVIEW:   CBC   Recent Labs Lab 02/25/17 0211  WBC 14.9*  HGB 11.5*  HCT 35.1  PLT 254    Chemistries   Recent Labs Lab 02/25/17 0211  NA 135  K 4.3  CL 102  CO2 26  GLUCOSE 320*  BUN 10  CREATININE 0.86  CALCIUM 8.6*  MG 2.2    Microbiology Results   Recent Results (from the past 240 hour(s))  Blood Culture (routine x 2)     Status: None (Preliminary result)   Collection Time: 02/24/17  8:08 PM  Result Value Ref Range Status   Specimen  Description BLOOD BLOOD RIGHT WRIST  Final   Special Requests   Final    BOTTLES DRAWN AEROBIC AND ANAEROBIC Blood Culture results may not be optimal due to an excessive volume of blood received in culture bottles   Culture NO GROWTH 2 DAYS  Final   Report Status PENDING  Incomplete  Blood Culture (routine x 2)     Status: None (Preliminary result)   Collection Time: 02/24/17  8:08 PM  Result Value Ref Range Status   Specimen Description BLOOD RIGHT ANTECUBITAL  Final   Special Requests   Final    BOTTLES DRAWN AEROBIC AND ANAEROBIC Blood Culture results may not be optimal due to an excessive volume of blood received in culture bottles   Culture NO GROWTH 2 DAYS  Final   Report Status PENDING  Incomplete  Urine culture     Status: None   Collection Time: 02/24/17 11:06 PM  Result Value Ref Range Status   Specimen Description URINE, RANDOM  Final   Special Requests NONE  Final   Culture   Final    NO GROWTH Performed at [facility], [address] [place], [place] [address]    Report Status 02/26/2017 FINAL  Final    RADIOLOGY:  Dg Chest Port 1 View  Result Date: 02/24/2017 CLINICAL DATA:  Chest pain, shortness of breath EXAM: PORTABLE CHEST 1 VIEW COMPARISON:  CT chest dated 08/01/2012 FINDINGS: Left upper lobe/ lingular opacity, suspicious for pneumonia. Right lung is essentially clear. No pleural effusion or pneumothorax. The heart is normal in size. Cervical spine fixation hardware. IMPRESSION: Left upper lobe/lingular opacity, suspicious for pneumonia. Electronically Signed   By: Julian Hy M.D.   On: 02/24/2017 20:28     Management plans discussed with the patient, family and they are in agreement.  CODE STATUS:     Code Status Orders        Start     Ordered   02/24/17 2315  Full code  Continuous     02/24/17 2314    Code Status History    Date Active Date Inactive Code Status Order ID Comments User Context   This patient has a current code status  but no historical code status.  TOTAL TIME TAKING CARE OF THIS PATIENT: *40* minutes.    Corisa Montini M.D on 02/26/2017 at 10:04 AM  Between 7am to 6pm - Pager - [phone] After 6pm go to [email or web address] - password [email or web address]  Office  [phone]  CC: Primary care physician; Glendon Axe, MD

## 2017-02-26 NOTE — Progress Notes (Signed)
Physical Therapy Treatment Patient Details Name: Suzanne Glenn MRN: [id number] DOB: 02/08/1951 Today's Date: 02/26/2017    History of Present Illness Pt is a 66 y.o.femalewith a history of anxiety, asthma, COPD, depression, DM, HTNnow being admitted with sepsis secondary to CAP, acute COPD exacerbation, and hypokalemia.    PT Comments    Pt was able to stand and ambulate around unit x 1 with O2 at 2 lpm which is her baseline O2 usage.  She stated she does not use/have a walker at home and did not feel like she needed one as she rarely leaves the house and uses furniture for stability.  After 200', walker was put aside and she walked the remaining 71' without walker but was generally unsteady and held writers hand for security.  She is aware of balance deficits and recommendation to use walker at home and especially when she does out for doctor appointments to help prevent falls.  She remains resistant but will consider.   Follow Up Recommendations  Home health PT     Equipment Recommendations  Rolling walker with 5" wheels    Recommendations for Other Services       Precautions / Restrictions Precautions Precautions: Fall Restrictions Weight Bearing Restrictions: No    Mobility  Bed Mobility Overal bed mobility: Independent                Transfers   Equipment used: Rolling walker (2 wheeled) Transfers: Sit to/from Stand Sit to Stand: Supervision            Ambulation/Gait Ambulation/Gait assistance: Supervision Ambulation Distance (Feet): 280 Feet Assistive device: Rolling walker (2 wheeled) Gait Pattern/deviations: WFL(Within Functional Limits)   Gait velocity interpretation: Below normal speed for age/gender     Stairs            Wheelchair Mobility    Modified Rankin (Stroke Patients Only)       Balance Overall balance assessment: Needs assistance Sitting-balance support: Feet supported;No upper extremity supported Sitting  balance-Leahy Scale: Good     Standing balance support: Bilateral upper extremity supported Standing balance-Leahy Scale: Good                              Cognition Arousal/Alertness: Awake/alert Behavior During Therapy: WFL for tasks assessed/performed Overall Cognitive Status: Within Functional Limits for tasks assessed                                        Exercises      General Comments        Pertinent Vitals/Pain Pain Assessment: No/denies pain    Home Living                      Prior Function            PT Goals (current goals can now be found in the care plan section) Progress towards PT goals: Progressing toward goals    Frequency    Min 2X/week      PT Plan Current plan remains appropriate    Co-evaluation              AM-PAC PT "6 Clicks" Daily Activity  Outcome Measure  Difficulty turning over in bed (including adjusting bedclothes, sheets and blankets)?: None Difficulty moving from lying on back to sitting on the side of  the bed? : None Difficulty sitting down on and standing up from a chair with arms (e.g., wheelchair, bedside commode, etc,.)?: None Help needed moving to and from a bed to chair (including a wheelchair)?: A Little Help needed walking in hospital room?: A Little Help needed climbing 3-5 steps with a railing? : A Little 6 Click Score: 21    End of Session Equipment Utilized During Treatment: Gait belt;Oxygen Activity Tolerance: Patient tolerated treatment well Patient left: in chair;with chair alarm set;with call bell/phone within reach Nurse Communication: Mobility status       Time: 3354-5625 PT Time Calculation (min) (ACUTE ONLY): 10 min  Charges:  $Gait Training: 8-22 mins                    G Codes:       Chesley Noon, PTA 02/26/17, 10:55 AM

## 2017-02-26 NOTE — Consult Note (Signed)
   [facility] CM Inpatient Consult   02/26/2017  SEELEY SOUTHGATE 19-Jul-1951 [id number]   Referral received by inpatient case manager for [facility] [facility] services and post hospital discharge follow up related to a diagnosis of COPD and Pneumonia. Patient was evaluated for community based chronic disease management services with [facility] care Management Program as a benefit of patient's [employer] [facility] [facility] Medicare. Called into patient's room to explain [facility] Care Management services. Patient endorses her primary care provider to be Dr. Candiss Norse. Patient states she has no trouble with transportation but frequently gets in the donut hole when paying for medications.Talked with patient about [facility] program and gave her instructions to receive her free meals. Verbal consent recieved. Patient gave [phone] as the best number to reach her. Patient will receive post hospital discharge calls and be evaluated for monthly home visits. [facility] Care Management services does not interfere with or replace any services arranged by the inpatient care management team. RNCM left contact information and [facility] literature at the bedside. Made inpatient RNCM aware that [facility] will be following for care management. For additional questions please contact:   Michaelah Credeur RN, [facility] Liaison  [phone]) Business Mobile [phone]) Toll free office

## 2017-02-27 LAB — LEGIONELLA PNEUMOPHILA SEROGP 1 UR AG: L. pneumophila Serogp 1 Ur Ag: NEGATIVE

## 2017-02-28 ENCOUNTER — Other Ambulatory Visit: Payer: Self-pay | Admitting: *Deleted

## 2017-02-28 ENCOUNTER — Encounter: Payer: Self-pay | Admitting: *Deleted

## 2017-02-28 NOTE — Patient Outreach (Signed)
[facility]) Care Management  [place]  02/28/2017   ALEX MCMANIGAL [birth year] [id number]    Transition of care call   Subjective:   Placed call to patient as part of transition of care program, explained reason for call, patient agreeable to follow up. Patient discussed feeling okay today , did not rest a well on last night as night before.  Patient denies increase in shortness or  breath or cough. Patient she has been taking her medications as prescribed, she uses a weekly pill organizer to keep track of medications , denies concerns with medication cost at this time. Patient reports she was using albuterol nebulizer treatments prior to admission to hospital and has that on hand if needed. Patient reports she has used her albuterol rescue nebulizer at least 2 times since being at home.Patient using her oxygen at 2 liters nasal cannula .   Patient reports she has [facility] and home visit planned for today, patient walker for home use , states she hasn't used it yet do to same living space at home.   Patient usually checks her blood sugar about 2 times a week , hasn't checked it since being home but states will get back into her routine.  Discussed recommendations for post discharge MD office visit, offered to assist with scheduling appointments, patient states she will able do it.   Patient discussed she hasn't smoked since hospital discharge, discussed smoking cessation and agreeable to further education .   Encounter Medications:  Outpatient Encounter Prescriptions as of 02/28/2017  Medication Sig  . ACCU-CHEK FASTCLIX LANCETS MISC   . ACCU-CHEK SMARTVIEW test strip   . albuterol (PROVENTIL HFA) 108 (90 BASE) MCG/ACT inhaler Inhale into the lungs.  . ALPRAZolam (XANAX) 0.5 MG tablet Take 1 tablet (0.5 mg total) by mouth at bedtime as needed for anxiety.  Marland Kitchen amLODipine (NORVASC) 10 MG tablet Take 1 tablet by mouth daily.  Marland Kitchen  aspirin 81 MG chewable tablet Chew 1 tablet by mouth daily.  Marland Kitchen azithromycin (ZITHROMAX) 250 MG tablet Take daily as directed  . cefUROXime (CEFTIN) 500 MG tablet Take 1 tablet (500 mg total) by mouth 2 (two) times daily with a meal.  . dextromethorphan (DELSYM) 30 MG/5ML liquid Take 5 mLs (30 mg total) by mouth 2 (two) times daily.  Marland Kitchen esomeprazole (NEXIUM) 40 MG capsule Take 40 mg by mouth daily.  Marland Kitchen guaiFENesin (MUCINEX) 600 MG 12 hr tablet Take 1 tablet (600 mg total) by mouth 2 (two) times daily.  Marland Kitchen ipratropium (ATROVENT) 0.02 % nebulizer solution Take 2.5 mLs (0.5 mg total) by nebulization every 6 (six) hours as needed for wheezing or shortness of breath.  . metFORMIN (GLUCOPHAGE) 500 MG tablet Take 1 tablet by mouth every morning.  . metoprolol tartrate (LOPRESSOR) 25 MG tablet Take 1 tablet by mouth 2 (two) times daily.  . potassium chloride (KLOR-CON) 8 MEQ tablet Take 1 tablet by mouth daily.  . pravastatin (PRAVACHOL) 80 MG tablet Take 1 tablet by mouth every evening.  . predniSONE (DELTASONE) 10 MG tablet Take 50 mg daily taper by 10 mg then stop  . spironolactone (ALDACTONE) 50 MG tablet Take 1 tablet by mouth daily.  Marland Kitchen tiotropium (SPIRIVA) 18 MCG inhalation capsule Place 1 mcg into inhaler and inhale daily.  . traZODone (DESYREL) 150 MG tablet Take 1 tablet (150 mg total) by mouth at bedtime.  Marland Kitchen venlafaxine XR (EFFEXOR-XR) 150 MG 24 hr capsule Take 1 capsule (150  mg total) by mouth daily with breakfast.   No facility-administered encounter medications on file as of 02/28/2017.   Patient was recently discharged from hospital and all medications have been reviewed.  Functional Status:  In your present state of health, do you have any difficulty performing the following activities: 02/28/2017 02/24/2017  Hearing? N N  Vision? N N  Difficulty concentrating or making decisions? N N  Walking or climbing stairs? Y N  Dressing or bathing? N N  Doing errands, shopping? N N  Preparing Food  and eating ? N -  Using the Toilet? N -  In the past six months, have you accidently leaked urine? N -  Do you have problems with loss of bowel control? N -  Managing your Medications? N -  Managing your Finances? N -  Housekeeping or managing your Housekeeping? N -  Some recent data might be hidden    Fall/Depression Screening: Fall Risk  02/28/2017  Falls in the past year? No  Risk for fall due to : Impaired balance/gait   PHQ 2/9 Scores 02/28/2017  PHQ - 2 Score 1    Plan:  Will continue to follow patient for transition of care with weekly outreaches Will send PCP initial barrier letter and patient welcome letter. Next telephone outreach in one week and then will schedule home visit.  Patient will schedule post discharge medical appointments    [name], RN, [place] Management [phone]- Mobile [phone]- [place] Office

## 2017-03-01 LAB — CULTURE, BLOOD (ROUTINE X 2)
CULTURE: NO GROWTH
Culture: NO GROWTH

## 2017-03-04 ENCOUNTER — Ambulatory Visit (INDEPENDENT_AMBULATORY_CARE_PROVIDER_SITE_OTHER): Payer: Medicare HMO | Admitting: Psychiatry

## 2017-03-04 ENCOUNTER — Encounter: Payer: Self-pay | Admitting: Psychiatry

## 2017-03-04 VITALS — BP 125/64 | HR 66 | Temp 97.6°F | Wt 169.6 lb

## 2017-03-04 DIAGNOSIS — F331 Major depressive disorder, recurrent, moderate: Secondary | ICD-10-CM | POA: Diagnosis not present

## 2017-03-04 DIAGNOSIS — F411 Generalized anxiety disorder: Secondary | ICD-10-CM

## 2017-03-04 MED ORDER — TRAZODONE HCL 150 MG PO TABS: 150.0000 mg | ORAL_TABLET | Freq: Every day | ORAL | 2 refills | Status: DC

## 2017-03-04 MED ORDER — VENLAFAXINE HCL ER 150 MG PO CP24: 150.0000 mg | ORAL_CAPSULE | Freq: Every day | ORAL | 2 refills | Status: DC

## 2017-03-04 MED ORDER — ALPRAZOLAM 0.25 MG PO TABS
0.2500 mg | ORAL_TABLET | Freq: Every evening | ORAL | 1 refills | Status: DC | PRN
Start: 2017-03-04 — End: 2017-05-27

## 2017-03-04 NOTE — Progress Notes (Signed)
BH MD/PA/NP OP Progress Note  03/04/2017 11:22 AM Suzanne Glenn  MRN:  [id number]  Subjective:    Pt  is a 66 year old female who presented for follow-up appointment. She reported that  She Was recently admitted to the inpatient hospital due to pneumonia. She was wearing oxygen tank as she was recently discharged from the hospital. Patient reported that she has been using oxygen for a long period of time. Patient reported that she has stopped taking all her antibiotics as she has completed the course. She reported that she is trying to take care of her health. She does not have worsening of her diabetes as she has taken a tapering dose of the prednisone. She is controlling her medications. She reported that she is looking forward to take care of her grand baby at this time. She denied having any depressive symptoms. She remains motivated she reported that the visiting nurse have not started coming to see her at this time.       She appears calm and alert during the interview. She remains receptive to medication changes at this time.  Chief Complaint:  Chief Complaint    Follow-up; Medication Refill     Visit Diagnosis:     ICD-9-CM ICD-10-CM   1. MDD (major depressive disorder), recurrent episode, moderate (HCC) 296.32 F33.1   2. GAD (generalized anxiety disorder) 300.02 F41.1     Past Medical History:  Past Medical History:  Diagnosis Date  . Anxiety   . Asthma   . COPD (chronic obstructive pulmonary disease) ([place])   . Depression   . Diabetes mellitus, type II ([place])   . Headache   . Hypertension     Past Surgical History:  Procedure Laterality Date  . BACK SURGERY    . cervical bone infusion    . CHOLECYSTECTOMY    . TUBAL LIGATION     Family History:  Family History  Problem Relation Age of Onset  . Anxiety disorder Mother   . Cancer - Lung Mother   . Depression Sister   . Cancer Sister   . Depression Brother   . Cancer Sister   . Cancer Sister   . Cancer  Sister   . Heart Problems Sister   . Bipolar disorder Sister   . Diabetes Sister   . Cancer - Lung Sister   . Hypertension Sister   . Diabetes Sister   . Hyperlipidemia Sister   . COPD Sister   . Heart Problems Brother   . Cancer Brother   . Cancer Brother   . Cancer Brother   . Breast cancer Maternal Aunt    Social History:  Social History   Social History  . Marital status: Married    Spouse name: N/A  . Number of children: N/A  . Years of education: N/A   Social History Main Topics  . Smoking status: Current Every Day Smoker    Packs/day: 1.00    Years: 40.00    Types: Cigarettes    Start date: 05/05/1975  . Smokeless tobacco: Never Used  . Alcohol use No     Comment: socially  . Drug use: No  . Sexual activity: No   Other Topics Concern  . None   Social History Narrative  . None   Additional History:   She currently lives with her husband. She reported that they live on the same property as her son and his family  Assessment:   Musculoskeletal: Strength & Muscle Tone: within normal  limits Gait & Station: normal Patient leans: N/A  Psychiatric Specialty Exam: Medication Refill  Associated symptoms include coughing and myalgias.  Insomnia  PMH includes: no depression.    Review of Systems  Respiratory: Positive for cough, shortness of breath and wheezing.   Musculoskeletal: Positive for back pain and myalgias.  Psychiatric/Behavioral: Negative for depression. The patient is nervous/anxious and has insomnia.   All other systems reviewed and are negative.   Blood pressure 125/64, pulse 66, temperature 97.6 F (36.4 C), temperature source Oral, weight 169 lb 9.6 oz (76.9 kg), SpO2 90 %.Body mass index is 30.04 kg/m.  General Appearance: Casual  Eye Contact:  Fair  Speech:  Clear and Coherent  Volume:  Normal  Mood:  Euthymic  Affect:  Congruent  Thought Process:  Coherent  Orientation:  Full (Time, Place, and Person)  Thought Content:  WDL   Suicidal Thoughts:  No  Homicidal Thoughts:  No  Memory:  Immediate;   Fair  Judgement:  Fair  Insight:  Fair  Psychomotor Activity:  Normal  Concentration:  Fair  Recall:  [employer] of Knowledge: Fair  Language: Fair  Akathisia:  No  Handed:  Right  AIMS (if indicated):    Assets:  Communication Skills Housing Physical Health Social Support  ADL's:  Intact  Cognition: WNL  Sleep:  8-9    Is the patient at risk to self?  No. Has the patient been a risk to self in the past 6 months?  No. Has the patient been a risk to self within the distant past?  No. Is the patient a risk to others?  No. Has the patient been a risk to others in the past 6 months?  No. Has the patient been a risk to others within the distant past?  No.  Current Medications: Current Outpatient Prescriptions  Medication Sig Dispense Refill  . ACCU-CHEK FASTCLIX LANCETS MISC     . ACCU-CHEK SMARTVIEW test strip     . albuterol (PROVENTIL HFA) 108 (90 BASE) MCG/ACT inhaler Inhale into the lungs.    Marland Kitchen albuterol (PROVENTIL) (2.5 MG/3ML) 0.083% nebulizer solution Take 2.5 mg by nebulization every 6 (six) hours as needed for wheezing or shortness of breath.    . ALPRAZolam (XANAX) 0.25 MG tablet Take 1 tablet (0.25 mg total) by mouth at bedtime as needed for anxiety. 30 tablet 1  . amLODipine (NORVASC) 10 MG tablet Take 1 tablet by mouth daily.    Marland Kitchen aspirin 81 MG chewable tablet Chew 1 tablet by mouth daily.    Marland Kitchen azithromycin (ZITHROMAX) 250 MG tablet Take daily as directed 4 each 0  . cefUROXime (CEFTIN) 500 MG tablet Take 1 tablet (500 mg total) by mouth 2 (two) times daily with a meal. 12 tablet 0  . dextromethorphan (DELSYM) 30 MG/5ML liquid Take 5 mLs (30 mg total) by mouth 2 (two) times daily. 89 mL 0  . esomeprazole (NEXIUM) 40 MG capsule Take 40 mg by mouth daily.    Marland Kitchen guaiFENesin (MUCINEX) 600 MG 12 hr tablet Take 1 tablet (600 mg total) by mouth 2 (two) times daily. 14 tablet 0  . ipratropium  (ATROVENT) 0.02 % nebulizer solution Take 2.5 mLs (0.5 mg total) by nebulization every 6 (six) hours as needed for wheezing or shortness of breath. 75 mL 12  . metFORMIN (GLUCOPHAGE) 500 MG tablet Take 1 tablet by mouth every morning.    . metoprolol tartrate (LOPRESSOR) 25 MG tablet Take 1 tablet by mouth 2 (two)  times daily.    . potassium chloride (KLOR-CON) 8 MEQ tablet Take 1 tablet by mouth daily.    . pravastatin (PRAVACHOL) 80 MG tablet Take 1 tablet by mouth every evening.    . predniSONE (DELTASONE) 10 MG tablet Take 50 mg daily taper by 10 mg then stop 15 tablet 0  . spironolactone (ALDACTONE) 50 MG tablet Take 1 tablet by mouth daily.    Marland Kitchen tiotropium (SPIRIVA) 18 MCG inhalation capsule Place 1 mcg into inhaler and inhale daily.    . traZODone (DESYREL) 150 MG tablet Take 1 tablet (150 mg total) by mouth at bedtime. 90 tablet 2  . venlafaxine XR (EFFEXOR-XR) 150 MG 24 hr capsule Take 1 capsule (150 mg total) by mouth daily with breakfast. 90 capsule 2   No current facility-administered medications for this visit.     Medical Decision Making:  Established Problem, Stable/Improving (1), Review of Psycho-Social Stressors (1) and Review of Last Therapy Session (1)  Treatment Plan Summary:Medication management   Depression Continue with venlafaxine 150 mg in the morning  Anxiety Continue with Xanax 0.25 mg by mouth Daily at at bedtime.  Patient will continue with trazodone 150 mg at bedtime.  Medications refilled for the next 3 months. 90 day supply of the medications are given.    This note was generated in part or whole with voice recognition software. Voice regonition is usually quite accurate but there are transcription errors that can and very often do occur. I apologize for any typographical errors that were not detected and corrected.   Rainey Pines, MD    03/04/2017, 11:22 AM

## 2017-03-05 NOTE — Progress Notes (Signed)
[facility]  Patient Status: Nurse arrived at scheduled time for [facility] visit.  Patient reports that she has not ever had home health services before and she did not know what it was about.  I explained to her the services that she would be receiving as well as finiancial responsibility which would be no copayment.  I explained that the home health nurse would provide her a lot of education on COPD, Pna, medications, disease management at home.  She reports that she has had COPD for years and manages it fine, she does not want home health services at this time.  Message left with Deadra at [facility] to inform PCP of non admit do to patient refusal. Notified Joni Reining, CM and Marshell Garfinkel, CM as well.     Suzanne Glenn 03/05/2017, 3:03 PM

## 2017-03-07 ENCOUNTER — Other Ambulatory Visit: Payer: Self-pay | Admitting: *Deleted

## 2017-03-07 DIAGNOSIS — J181 Lobar pneumonia, unspecified organism: Secondary | ICD-10-CM | POA: Diagnosis not present

## 2017-03-07 DIAGNOSIS — J441 Chronic obstructive pulmonary disease with (acute) exacerbation: Secondary | ICD-10-CM | POA: Diagnosis not present

## 2017-03-07 DIAGNOSIS — E119 Type 2 diabetes mellitus without complications: Secondary | ICD-10-CM | POA: Diagnosis not present

## 2017-03-07 NOTE — Patient Outreach (Signed)
[facility]) Care Management  03/07/2017  Suzanne Glenn 09-21-1951 [id number]   Transition of care call   Spoke with patient, reports she is feeling good on today, recently returned from office visit with PCP.  Patient denies increase in shortness of breath, using albuterol inhaler about once daily. Patient discussed she has started back smoking some, but plans to get nicotine patch or gum that has helped her in the past. Patient has completed all antibiotics .  Patient states home health RN has visited her, patient does not feel it is necessary so services not started. Patient reports her blood sugars have been running in the 114 to 120 range.    Patient denies any new concerns, agreeable to home visit.  Plan Will schedule transition of care home visit in the next week.  Patient will follow action plan for notify MD of worsening of respiratory symptoms [name], RN, [place] Management [phone]- Mobile [phone]- [place]

## 2017-03-10 DIAGNOSIS — J449 Chronic obstructive pulmonary disease, unspecified: Secondary | ICD-10-CM | POA: Diagnosis not present

## 2017-03-13 ENCOUNTER — Other Ambulatory Visit: Payer: Self-pay | Admitting: *Deleted

## 2017-03-13 ENCOUNTER — Encounter: Payer: Self-pay | Admitting: *Deleted

## 2017-03-13 NOTE — Patient Outreach (Signed)
[facility]) Care Management   03/13/2017  Suzanne Glenn [birth year] [id number]  Suzanne Glenn is an 66 y.o. female  Subjective:  Patient reports feeling pretty good on today, reports using albuterol rescue inhaler once this morning with good relief. Patient discussed cost of spiriva, and she normally gets in the doughnut hole later in the year and it cost her over $100. Patient states she had paper work  in the past of complete for assistance but she did not complete last year. Patient discussed she has quit smoking in the past and plans to work toward that again.     Objective:  BP 120/68 (BP Location: Right Arm, Patient Position: Sitting, Cuff Size: Normal)   Pulse 68   Resp 18   SpO2 93%   Greeted by patient at her front deck.  Review of Systems  Constitutional: Negative.   HENT: Negative.   Eyes: Negative.   Respiratory: Positive for cough. Negative for shortness of breath and wheezing.   Cardiovascular: Negative.   Gastrointestinal: Negative.   Genitourinary: Negative.   Musculoskeletal: Negative.   Skin: Negative.   Neurological: Negative.   Endo/Heme/Allergies: Negative.   Psychiatric/Behavioral: Negative.     Physical Exam  Constitutional: She is oriented to person, place, and time. She appears well-developed and well-nourished.  Cardiovascular: Normal rate, normal heart sounds and intact distal pulses.   Respiratory: Effort normal. She has decreased breath sounds in the right lower field and the left lower field.  GI: Soft.  Neurological: She is alert and oriented to person, place, and time.  Skin: Skin is warm and dry.  Psychiatric: She has a normal mood and affect. Her behavior is normal. Judgment and thought content normal.    Encounter Medications:   Outpatient Encounter Prescriptions as of 03/13/2017  Medication Sig Note  . ACCU-CHEK FASTCLIX LANCETS MISC    . ACCU-CHEK SMARTVIEW test strip    . albuterol (PROVENTIL HFA) 108 (90  BASE) MCG/ACT inhaler Inhale into the lungs.   Marland Kitchen albuterol (PROVENTIL) (2.5 MG/3ML) 0.083% nebulizer solution Take 2.5 mg by nebulization every 6 (six) hours as needed for wheezing or shortness of breath.   . ALPRAZolam (XANAX) 0.25 MG tablet Take 1 tablet (0.25 mg total) by mouth at bedtime as needed for anxiety.   Marland Kitchen amLODipine (NORVASC) 10 MG tablet Take 1 tablet by mouth daily.   Marland Kitchen aspirin 81 MG chewable tablet Chew 1 tablet by mouth daily.   Marland Kitchen esomeprazole (NEXIUM) 40 MG capsule Take 40 mg by mouth daily.   Marland Kitchen ipratropium (ATROVENT) 0.02 % nebulizer solution Take 2.5 mLs (0.5 mg total) by nebulization every 6 (six) hours as needed for wheezing or shortness of breath.   . metFORMIN (GLUCOPHAGE) 500 MG tablet Take 1 tablet by mouth every morning.   . metoprolol tartrate (LOPRESSOR) 25 MG tablet Take 1 tablet by mouth 2 (two) times daily.   . Multiple Vitamin (MULTIVITAMIN) tablet Take 1 tablet by mouth daily.   . potassium chloride (KLOR-CON) 8 MEQ tablet Take 1 tablet by mouth daily.   . pravastatin (PRAVACHOL) 80 MG tablet Take 1 tablet by mouth every evening.   Marland Kitchen spironolactone (ALDACTONE) 50 MG tablet Take 1 tablet by mouth daily.   Marland Kitchen tiotropium (SPIRIVA) 18 MCG inhalation capsule Place 1 mcg into inhaler and inhale daily.   . traZODone (DESYREL) 150 MG tablet Take 1 tablet (150 mg total) by mouth at bedtime.   Marland Kitchen venlafaxine XR (EFFEXOR-XR) 150 MG 24 hr capsule  Take 1 capsule (150 mg total) by mouth daily with breakfast.   . azithromycin (ZITHROMAX) 250 MG tablet Take daily as directed (Patient not taking: Reported on 03/13/2017) 03/13/2017: completed  . cefUROXime (CEFTIN) 500 MG tablet Take 1 tablet (500 mg total) by mouth 2 (two) times daily with a meal. (Patient not taking: Reported on 03/13/2017) 03/13/2017: completed  . dextromethorphan (DELSYM) 30 MG/5ML liquid Take 5 mLs (30 mg total) by mouth 2 (two) times daily. (Patient not taking: Reported on 03/13/2017)   . guaiFENesin (MUCINEX) 600  MG 12 hr tablet Take 1 tablet (600 mg total) by mouth 2 (two) times daily. (Patient not taking: Reported on 03/13/2017)   . predniSONE (DELTASONE) 10 MG tablet Take 50 mg daily taper by 10 mg then stop (Patient not taking: Reported on 03/13/2017) 03/13/2017: completed   No facility-administered encounter medications on file as of 03/13/2017.   Patient was recently discharged from hospital and all medications have been reviewed.  Functional Status:   In your present state of health, do you have any difficulty performing the following activities: 03/07/2017 02/28/2017  Hearing? - N  Vision? Y N  Difficulty concentrating or making decisions? - N  Walking or climbing stairs? - Y  Dressing or bathing? - N  Doing errands, shopping? - N  Conservation officer, nature and eating ? - N  Using the Toilet? - N  In the past six months, have you accidently leaked urine? - N  Do you have problems with loss of bowel control? - N  Managing your Medications? - N  Managing your Finances? - N  Housekeeping or managing your Housekeeping? - N  Some recent data might be hidden    Fall/Depression Screening:    Fall Risk  02/28/2017  Falls in the past year? No  Risk for fall due to : Impaired balance/gait   PHQ 2/9 Scores 02/28/2017  PHQ - 2 Score 1    Assessment:   Transition of care home visit. Patient tolerating ambulation in home without difficulty, not using rolling walker.   COPD Patient in usual state of breathing. Has all medications and taking as prescribed. Discuss some concern regarding cost of spiriva.Patient continues to smoke but discussed she  plans to quit smoking no quit date yet. Patient agreeable to quit smoking education.    Diabetes  Blood sugar 118 this morning her usual range.     History of Depression Consistent follow up with psychiatrist.   Plan:  [facility] consent signed and packet reviewed Provided and reviewed EMMI on  COPD when to call MD, quit smoking video Will consult pharmacy  regarding cost of spiriva .  Will follow up with patient by telephone in the next week as part of transition of care. Will send visit note to PCP.   [facility] CM Care Plan Problem One     Most Recent Value  Care Plan Problem One  Recent hospital discharge related to COPD  Role Documenting the Problem One  Care Management [place] for Problem One  Active  THN Long Term Goal   Patient will not experience a hospital admission in the next 31 days   THN Long Term Goal Start Date  02/28/17  Interventions for Problem One Long Term Goal  transition of care home visit.   THN CM Short Term Goal #1   Patient will keep all medical appointments in the next 30 days   THN CM Short Term Goal #1 Start Date  02/28/17  Interventions  for Short Term Goal #1  Reinforced on  importance of keeping all appointments, offered to assist scheduling visits   THN CM Short Term Goal #2   Patient will be able to report increased knowledge of worsening respiratory symptoms in the next 30 days   THN CM Short Term Goal #2 Start Date  02/28/17  Interventions for Short Term Goal #2  Provided and reviewed zone magnet handout   THN CM Short Term Goal #3  Patient will report making steps to quit smoking in the next 30 days   Interventions for Short Tern Goal #3  Provided EMMI video education on quit smoking and written information on COPD packet with quit smoking handout            [name], RN, [place] Management [phone]- Mobile [phone]- [place]

## 2017-03-14 ENCOUNTER — Encounter: Payer: Self-pay | Admitting: *Deleted

## 2017-03-19 ENCOUNTER — Other Ambulatory Visit: Payer: Self-pay | Admitting: *Deleted

## 2017-03-19 NOTE — Patient Outreach (Signed)
[facility]) Care Management  03/19/2017  OMAR ORREGO 1950/10/18 [id number]   Transition of care call  Spoke with patient reports she has a cough, no change in sputum color, she has been using her nebulizer about 2 to 3 times a day. Patient denies having a fever, oxygen saturation is 94% today, she is wearing oxygen at 2 liters . She discussed she continues to smoke states she thinks she has smoked more in the last week, no sure what triggered it, reports she and her husband have had conversation about quitting smoking.  Reinforced education on not smoking benefits. Patient reports she has scheduled visit with pulmonologist on tomorrow.   Plan Will continue to follow for transition of care , next outreach call in a week. Will continue to reinforce smoking cessation and notify MD of new or worsening of respiratory symptoms and 911 for emergency.   [name], RN, [place] Management [phone]- Mobile [phone]- Toll Free Main Office

## 2017-03-20 DIAGNOSIS — R0609 Other forms of dyspnea: Secondary | ICD-10-CM | POA: Diagnosis not present

## 2017-03-20 DIAGNOSIS — J449 Chronic obstructive pulmonary disease, unspecified: Secondary | ICD-10-CM | POA: Diagnosis not present

## 2017-03-20 DIAGNOSIS — R05 Cough: Secondary | ICD-10-CM | POA: Diagnosis not present

## 2017-03-26 ENCOUNTER — Other Ambulatory Visit: Payer: Self-pay | Admitting: *Deleted

## 2017-03-26 NOTE — Patient Outreach (Signed)
[facility]) Care Management  03/26/2017  Suzanne Glenn 11/30/1950 [id number]  Transition of care call  Spoke with patient reports she is feeling pretty good. She discussed follow up visit with Pulmonary MD on last week, she had a cough but chest xray did not show pneumonia. Patient reports she uses her oxygen at night and on and off during the day, oxygen saturation is 90 % If less than 90 she places her oxygen on . Patient states she is using her nebulizer treatment albuterol about once a day in the last week, she denies increase in cough or change in sputum. Patient tolerating usual activity around home, even baby sitting her great grandson at times.   Patient continues to smoke declines being ready to quit at this time   Patient denies new concerns, voices understanding of notifying MD of worsening of respiratory symptoms.  Plan Will place transition of care call in the next week, that will complete 31 days of transition of care follow up.   [name], RN, [place] Management [phone]- Mobile [phone]- Toll Free Main Office

## 2017-03-28 ENCOUNTER — Other Ambulatory Visit: Payer: Self-pay | Admitting: Pharmacist

## 2017-03-28 NOTE — Patient Outreach (Signed)
[facility]) Care Management  [place]   03/28/2017  Suzanne Glenn May 18, 1951 [id number]  Subjective:  Patient was referred to [facility] by [facility] RN Maudie Mercury for patient reported cost concerns with medications.    Successful phone outreach to patient.  Noted by chart review patient was admitted at [facility] 5/13-5/15/18.    Patient reports she is concerned with cost of her inhalers.  Patient reports she typically enters the coverage gap during the year.    Patient was willing to review her medications over the phone with [facility] Pharmacist.    Patient reports she is supposed to be using Advair, but has not been using secondary to cost.   Objective:   Current Medications: Current Outpatient Prescriptions  Medication Sig Dispense Refill  . ACCU-CHEK FASTCLIX LANCETS MISC     . ACCU-CHEK SMARTVIEW test strip     . albuterol (PROVENTIL HFA) 108 (90 BASE) MCG/ACT inhaler Inhale into the lungs.    Marland Kitchen albuterol (PROVENTIL) (2.5 MG/3ML) 0.083% nebulizer solution Take 2.5 mg by nebulization every 6 (six) hours as needed for wheezing or shortness of breath.    . ALPRAZolam (XANAX) 0.25 MG tablet Take 1 tablet (0.25 mg total) by mouth at bedtime as needed for anxiety. 30 tablet 1  . amLODipine (NORVASC) 10 MG tablet Take 1 tablet by mouth daily.    Marland Kitchen aspirin 81 MG chewable tablet Chew 1 tablet by mouth daily.    Marland Kitchen azithromycin (ZITHROMAX) 250 MG tablet Take daily as directed (Patient not taking: Reported on 03/13/2017) 4 each 0  . cefUROXime (CEFTIN) 500 MG tablet Take 1 tablet (500 mg total) by mouth 2 (two) times daily with a meal. (Patient not taking: Reported on 03/13/2017) 12 tablet 0  . dextromethorphan (DELSYM) 30 MG/5ML liquid Take 5 mLs (30 mg total) by mouth 2 (two) times daily. (Patient not taking: Reported on 03/13/2017) 89 mL 0  . esomeprazole (NEXIUM) 40 MG capsule Take 40 mg by mouth daily.    Marland Kitchen guaiFENesin (MUCINEX) 600 MG 12 hr tablet Take 1 tablet  (600 mg total) by mouth 2 (two) times daily. (Patient not taking: Reported on 03/13/2017) 14 tablet 0  . ipratropium (ATROVENT) 0.02 % nebulizer solution Take 2.5 mLs (0.5 mg total) by nebulization every 6 (six) hours as needed for wheezing or shortness of breath. 75 mL 12  . metFORMIN (GLUCOPHAGE) 500 MG tablet Take 1 tablet by mouth every morning.    . metoprolol tartrate (LOPRESSOR) 25 MG tablet Take 1 tablet by mouth 2 (two) times daily.    . Multiple Vitamin (MULTIVITAMIN) tablet Take 1 tablet by mouth daily.    . potassium chloride (KLOR-CON) 8 MEQ tablet Take 1 tablet by mouth daily.    . pravastatin (PRAVACHOL) 80 MG tablet Take 1 tablet by mouth every evening.    . predniSONE (DELTASONE) 10 MG tablet Take 50 mg daily taper by 10 mg then stop (Patient not taking: Reported on 03/13/2017) 15 tablet 0  . spironolactone (ALDACTONE) 50 MG tablet Take 1 tablet by mouth daily.    Marland Kitchen tiotropium (SPIRIVA) 18 MCG inhalation capsule Place 1 mcg into inhaler and inhale daily.    . traZODone (DESYREL) 150 MG tablet Take 1 tablet (150 mg total) by mouth at bedtime. 90 tablet 2  . venlafaxine XR (EFFEXOR-XR) 150 MG 24 hr capsule Take 1 capsule (150 mg total) by mouth daily with breakfast. 90 capsule 2   No current facility-administered medications for this  visit.     Functional Status: In your present state of health, do you have any difficulty performing the following activities: 03/07/2017 02/28/2017  Hearing? - N  Vision? Y N  Difficulty concentrating or making decisions? - N  Walking or climbing stairs? - Y  Dressing or bathing? - N  Doing errands, shopping? - N  Conservation officer, nature and eating ? - N  Using the Toilet? - N  In the past six months, have you accidently leaked urine? - N  Do you have problems with loss of bowel control? - N  Managing your Medications? - N  Managing your Finances? - N  Housekeeping or managing your Housekeeping? - N  Some recent data might be hidden     Fall/Depression Screening: Fall Risk  02/28/2017  Falls in the past year? No  Risk for fall due to : Impaired balance/gait   PHQ 2/9 Scores 02/28/2017  PHQ - 2 Score 1    Assessment:  Medication review per patient report and comparing medication list in chart.  Patient was recently discharged from hospital and all medications have been reviewed.  Drugs sorted by system:  Neurologic/Psychologic: -alprazolam -trazodone -venlafaxine XR capsules  Cardiovascular: -amlodipine -aspirin -metoprolol tartrate -pravastatin -spironolactone  Pulmonary/Allergy: -albuterol nebs -albuterol inhaler -ipratropium nebs -tiotropium (Spiriva)   Gastrointestinal: -esomeprazole  Endocrine: -metformin  Vitamins/Minerals: -multivitamin -potassium chloride   Patient was discharged on: -azithromycin and cefuroxime---reports she completed course -dextromethorphan---reports she is not using -guaifenesin---reports she is not using -prednisone----reports she completed course   Medication assistance  Discussed Social Security Administration Extra Help--patient reports income exceeds requirements.   Discussed [place] (Spiriva) and [place] Patient Assistance (Ventolin and Advair) programs.    Patient reports she already has the applications.  Reviewed the applications with patient over the phone as well as program requirements.  Patient was encouraged to apply to see if she is eligible for manufacturer assistance.  Plan:  Will route medication review note to PCP.   Will place follow-up call to patient in the next 2 weeks to follow-up on patient assistance application status.    Karrie Meres, PharmD, [place] [phone]

## 2017-04-02 ENCOUNTER — Other Ambulatory Visit: Payer: Self-pay | Admitting: *Deleted

## 2017-04-02 NOTE — Patient Outreach (Signed)
[facility]) Care Management  04/02/2017  Suzanne Glenn April 13, 1951 [id number]  Transition of care call  Spoke with patient reports she is doing pretty good, she is tolerating usual activity, households chores. Patient discussed recent short trip out of town and tolerated well. Patient denies any increase in shortness of breath or cough. She uses her rescue inhaler about 5 days a week once daily with good relief.  Discussed smoking cessation patient states she is still thinking about it and knows it the best thing to do, but does not have mind made up yet. She declined need for additional information.   Patient has completed 31 days of transition of care program, denies any new concerns at this time. Will continue to be followed for complex care management.  Plan Will follow up with patient by telephone in the next 2 weeks. Patient will notify MD of any worsening of respiratory concerns and call. Patient will notify RN of new concern/needs that arise.  [facility] CM Care Plan Problem One     Most Recent Value  Care Plan Problem One  Recent hospital discharge related to COPD  Role Documenting the Problem One  Care Management [place] for Problem One  Active  THN Long Term Goal   Patient will not experience a hospital admission in the next 31 days   THN Long Term Goal Start Date  02/28/17  Interventions for Problem One Long Term Goal  Encouraged to notify MD of worsening of symptoms, continue to take medications as prescribed, and reviewed benefits of not smoking .   THN CM Short Term Goal #1   Patient will keep all medical appointments in the next 30 days   THN CM Short Term Goal #1 Start Date  02/28/17  [facility] CM Short Term Goal #1 Met Date  03/26/17  THN CM Short Term Goal #2   Patient will be able to report increased knowledge of worsening respiratory symptoms in the next 30 days   THN CM Short Term Goal #2 Start Date  02/28/17  [facility] CM Short Term Goal #2 Met  Date  04/02/17  THN CM Short Term Goal #3  Patient will report making steps to quit smoking in the next 30 days   THN CM Short Term Goal #3 Start Date  03/13/17  Interventions for Short Tern Goal #3  Reinforced benefits of not smoking      [name], RN, [place] Management [phone]- Mobile [phone]- [place]

## 2017-04-10 ENCOUNTER — Other Ambulatory Visit: Payer: Self-pay | Admitting: Pharmacist

## 2017-04-10 DIAGNOSIS — J449 Chronic obstructive pulmonary disease, unspecified: Secondary | ICD-10-CM | POA: Diagnosis not present

## 2017-04-10 NOTE — Patient Outreach (Signed)
[place] [facility]) Care Management  04/10/2017  Suzanne Glenn 1951-07-26 [id number]  Follow-up call to patient to follow-up on patient assistance program applications for [place] and [place] Patient Assistance.    Patient reports she has applications completed but has not taken to Dr Raul Del for completion due to a document completion fee at the MD office.   [facility] Pharmacist called [facility] and spoke with Caryl Pina to verify this.  She reports while there is a document completion fee, they do not charge it for patient assistance forms.   [facility] Pharmacist called patient back and advised her of call to [facility].  Encouraged her to take her patient assistance applications to her prescriber for completion.    Plan:  Will continue to follow-up with patient regarding her patient assistance applications.   Karrie Meres, PharmD, [place] [phone]

## 2017-04-16 ENCOUNTER — Other Ambulatory Visit: Payer: Self-pay | Admitting: *Deleted

## 2017-04-16 NOTE — Patient Outreach (Signed)
[place] [facility]) Care Management  04/16/2017  Suzanne Glenn 21-Feb-1951 [id number]   Follow telephone call  Spoke with patient reports she is doing pretty good on today. Discussed she has good and bad days with her breathing , using albuterol inhaler about 2 times daily, with relief, states she has not been using nebulizer. Patient states she is still smoking, but not as much,voiced understanding of benefit of not smoking, has not made decision to quit yet.  Patient continues to tolerate usual activity around home, back to baby sitting her grandson some. Patient tolerating diet, blood sugars running in the 110 range per patient .  Patient denies any new concerns at this time, able to recall worsening symptoms to notify MD of ,.   Plan  Will follow up by telephone in the next 3 weeks , and if no new concerns will plan case closure.   [name], RN, [place] Management [phone]- Mobile [phone]- Toll Free Main Office

## 2017-04-19 DIAGNOSIS — J449 Chronic obstructive pulmonary disease, unspecified: Secondary | ICD-10-CM | POA: Diagnosis not present

## 2017-04-22 ENCOUNTER — Other Ambulatory Visit: Payer: Self-pay | Admitting: Pharmacist

## 2017-04-22 ENCOUNTER — Ambulatory Visit: Payer: Medicare HMO | Admitting: Pharmacist

## 2017-04-22 NOTE — Patient Outreach (Signed)
[facility]) Care Management  04/22/2017  SHAKTHI SCIPIO [birth year] [id number]  Unsuccessful phone follow-up to patient regarding her patient assistance applications--she is needing to get them to her prescriber.   No answer, HIPAA compliant message left requesting return call.  Plan:  Will make another phone outreach attempt to patient next week if no return call.   Karrie Meres, PharmD, [place] [phone]

## 2017-04-29 ENCOUNTER — Other Ambulatory Visit: Payer: Self-pay | Admitting: Pharmacist

## 2017-04-29 NOTE — Patient Outreach (Signed)
[place] [facility]) Care Management  04/29/2017  SHANIYAH WIX 10-May-1951 [id number]  Second unsuccessful phone outreach to patient, HIPAA compliant message left requesting call.   Plan:  If no return call, will make third phone outreach attempt in the next week.   Karrie Meres, PharmD, [place] [phone]

## 2017-05-01 ENCOUNTER — Ambulatory Visit: Payer: Medicare HMO | Admitting: Psychiatry

## 2017-05-02 ENCOUNTER — Other Ambulatory Visit: Payer: Self-pay | Admitting: Pharmacist

## 2017-05-02 ENCOUNTER — Encounter: Payer: Self-pay | Admitting: Pharmacist

## 2017-05-02 NOTE — Patient Outreach (Signed)
[place] [facility]) Care Management  05/02/2017  TZIPORAH KNOKE [birth year] [id number]  Third unsuccessful phone outreach to patient.  No answer, HIPAA compliant message left requesting return call from patient.   Plan:  Will send patient an Economist.  If no reply from patient in 10 business days, will close case.   Karrie Meres, PharmD, [place] [phone]

## 2017-05-06 ENCOUNTER — Other Ambulatory Visit: Payer: Self-pay | Admitting: *Deleted

## 2017-05-06 NOTE — Patient Outreach (Addendum)
[facility]) Care Management  05/06/2017  SHYTERIA LEWIS [birth year] [id number]   Transition of care  60 day follow up call  Placed call to patient at 1206 and at 2:50 , no answer able leave a hipaa compliant message requesting a return call.  Plan Await return call if no response will attempt call within the  next week.  [name], RN, [place] Management [phone]- Mobile [phone]- Toll Free Main Office

## 2017-05-10 ENCOUNTER — Other Ambulatory Visit: Payer: Self-pay | Admitting: *Deleted

## 2017-05-10 ENCOUNTER — Encounter: Payer: Self-pay | Admitting: *Deleted

## 2017-05-10 DIAGNOSIS — J449 Chronic obstructive pulmonary disease, unspecified: Secondary | ICD-10-CM | POA: Diagnosis not present

## 2017-05-10 NOTE — Patient Outreach (Signed)
[place]) Care Management  05/10/2017  Suzanne Glenn 03/30/1951 [id number]   Telephone follow up call - Post 60 day transition of care.  Spoke with patient reports she is feeling good, managing well at home with her breathing at baseline able to recognize she is in green zone. Patient reports limited need for rescue inhaler,  Nebulizer but using  as needed, taking medications as prescribed.   Patient discussed she continues to smoke , declined further education as this time or being ready to quit,states I know I need to quit.  Patient discussed she has not gotten all information together for pharmacy assistance yet, but she will work on it and verified she has Karrie Meres , PharmD contact information for needs.   Patient has not experienced hospital ED or readmission in the last 60 days, of transition of care, goals have been met, patient declined need for further follow up at this time . Patient agreeable to case closure verified she has [facility] contact information for future needs.   Plan Will close case to care management and notify CMA, Karrie Meres, Pharmacist and send MD case closure letter.     [name], RN, [place] Management Coordinator  [phone]- Mobile [phone]- Toll Free Main Office

## 2017-05-20 ENCOUNTER — Other Ambulatory Visit: Payer: Self-pay | Admitting: Pharmacist

## 2017-05-20 DIAGNOSIS — J449 Chronic obstructive pulmonary disease, unspecified: Secondary | ICD-10-CM | POA: Diagnosis not present

## 2017-05-20 NOTE — Patient Outreach (Signed)
[facility] [facility]) Care Management  05/20/2017  Suzanne Glenn 1951/07/03 [id number]  Three phone calls were made to patient and outreach letter mailed to patient.  No response from patient after required number of attempts.   Plan:  Will close pharmacy case due to inability to maintain contact with patient.   Karrie Meres, PharmD, [place] [phone]

## 2017-05-27 ENCOUNTER — Ambulatory Visit (INDEPENDENT_AMBULATORY_CARE_PROVIDER_SITE_OTHER): Payer: Medicare HMO | Admitting: Psychiatry

## 2017-05-27 ENCOUNTER — Encounter: Payer: Self-pay | Admitting: Psychiatry

## 2017-05-27 VITALS — BP 116/70 | HR 84 | Temp 98.6°F | Wt 163.8 lb

## 2017-05-27 DIAGNOSIS — F331 Major depressive disorder, recurrent, moderate: Secondary | ICD-10-CM

## 2017-05-27 DIAGNOSIS — F411 Generalized anxiety disorder: Secondary | ICD-10-CM | POA: Diagnosis not present

## 2017-05-27 MED ORDER — TRAZODONE HCL 150 MG PO TABS: 150.0000 mg | ORAL_TABLET | Freq: Every day | ORAL | 2 refills | Status: DC

## 2017-05-27 MED ORDER — VENLAFAXINE HCL ER 150 MG PO CP24: 150.0000 mg | ORAL_CAPSULE | Freq: Every day | ORAL | 2 refills | Status: DC

## 2017-05-27 MED ORDER — ALPRAZOLAM 0.25 MG PO TABS
0.2500 mg | ORAL_TABLET | Freq: Every evening | ORAL | 0 refills | Status: DC | PRN
Start: 2017-05-27 — End: 2017-08-26

## 2017-05-27 NOTE — Progress Notes (Signed)
BH MD/PA/NP OP Progress Note  05/27/2017 10:38 AM Suzanne Glenn  MRN:  [id number]  Subjective:    Pt  is a 66 year old female who presented for follow-up appointment. She reported that  She was trying to stop the alprazolam and stopped it completely on July 1. However after 7 days she started having nausea vomiting and restarted taking the medication again. She reported that she is interested in stopping the medication completely. She reported that she has been taking a small dose of the medication. Patient stated that she has been doing well on her current psychotropic medications. She denied having any suicidal ideations or plans. She appeared calm and alert during the interview.   She remains receptive to medication changes at this time.  Chief Complaint:  Chief Complaint    Follow-up; Medication Refill     Visit Diagnosis:     ICD-10-CM   1. MDD (major depressive disorder), recurrent episode, moderate (HCC) F33.1   2. GAD (generalized anxiety disorder) F41.1     Past Medical History:  Past Medical History:  Diagnosis Date  . Anxiety   . Asthma   . COPD (chronic obstructive pulmonary disease) ([place])   . Depression   . Diabetes mellitus, type II ([place])   . Headache   . Hypertension     Past Surgical History:  Procedure Laterality Date  . BACK SURGERY    . cervical bone infusion    . CHOLECYSTECTOMY    . TUBAL LIGATION     Family History:  Family History  Problem Relation Age of Onset  . Anxiety disorder Mother   . Cancer - Lung Mother   . Depression Sister   . Cancer Sister   . Depression Brother   . Cancer Sister   . Cancer Sister   . Cancer Sister   . Heart Problems Sister   . Bipolar disorder Sister   . Diabetes Sister   . Cancer - Lung Sister   . Hypertension Sister   . Diabetes Sister   . Hyperlipidemia Sister   . COPD Sister   . Heart Problems Brother   . Cancer Brother   . Cancer Brother   . Cancer Brother   . Breast cancer Maternal Aunt     Social History:  Social History   Social History  . Marital status: Married    Spouse name: N/A  . Number of children: N/A  . Years of education: N/A   Social History Main Topics  . Smoking status: Current Every Day Smoker    Packs/day: 1.00    Years: 40.00    Types: Cigarettes    Start date: 05/05/1975  . Smokeless tobacco: Never Used  . Alcohol use No     Comment: socially  . Drug use: No  . Sexual activity: No   Other Topics Concern  . None   Social History Narrative  . None   Additional History:   She currently lives with her husband. She reported that they live on the same property as her son and his family  Assessment:   Musculoskeletal: Strength & Muscle Tone: within normal limits Gait & Station: normal Patient leans: N/A  Psychiatric Specialty Exam: Medication Refill  Associated symptoms include coughing and myalgias.  Insomnia  PMH includes: no depression.    Review of Systems  Respiratory: Positive for cough, shortness of breath and wheezing.   Musculoskeletal: Positive for back pain and myalgias.  Psychiatric/Behavioral: Negative for depression. The patient is nervous/anxious and has  insomnia.   All other systems reviewed and are negative.   Blood pressure 116/70, pulse 84, temperature 98.6 F (37 C), temperature source Oral, weight 163 lb 12.8 oz (74.3 kg).Body mass index is 29.02 kg/m.  General Appearance: Casual  Eye Contact:  Fair  Speech:  Clear and Coherent  Volume:  Normal  Mood:  Euthymic  Affect:  Congruent  Thought Process:  Coherent  Orientation:  Full (Time, Place, and Person)  Thought Content:  WDL  Suicidal Thoughts:  No  Homicidal Thoughts:  No  Memory:  Immediate;   Fair  Judgement:  Fair  Insight:  Fair  Psychomotor Activity:  Normal  Concentration:  Fair  Recall:  [employer] of Knowledge: Fair  Language: Fair  Akathisia:  No  Handed:  Right  AIMS (if indicated):    Assets:  Communication  Skills Housing Physical Health Social Support  ADL's:  Intact  Cognition: WNL  Sleep:  8-9    Is the patient at risk to self?  No. Has the patient been a risk to self in the past 6 months?  No. Has the patient been a risk to self within the distant past?  No. Is the patient a risk to others?  No. Has the patient been a risk to others in the past 6 months?  No. Has the patient been a risk to others within the distant past?  No.  Current Medications: Current Outpatient Prescriptions  Medication Sig Dispense Refill  . ACCU-CHEK FASTCLIX LANCETS MISC     . ACCU-CHEK SMARTVIEW test strip     . albuterol (PROVENTIL HFA) 108 (90 BASE) MCG/ACT inhaler Inhale into the lungs.    Marland Kitchen albuterol (PROVENTIL) (2.5 MG/3ML) 0.083% nebulizer solution Take 2.5 mg by nebulization every 6 (six) hours as needed for wheezing or shortness of breath.    . ALPRAZolam (XANAX) 0.25 MG tablet Take 1 tablet (0.25 mg total) by mouth at bedtime as needed for anxiety. Take 1/2 pill at bedtime prn.Stop in 1 month 15 tablet 0  . amLODipine (NORVASC) 10 MG tablet Take 1 tablet by mouth daily.    Marland Kitchen aspirin 81 MG chewable tablet Chew 1 tablet by mouth daily.    Marland Kitchen esomeprazole (NEXIUM) 40 MG capsule Take 40 mg by mouth daily.    Marland Kitchen ipratropium (ATROVENT) 0.02 % nebulizer solution Take 2.5 mLs (0.5 mg total) by nebulization every 6 (six) hours as needed for wheezing or shortness of breath. 75 mL 12  . metFORMIN (GLUCOPHAGE) 500 MG tablet Take 1 tablet by mouth every morning.    . metoprolol tartrate (LOPRESSOR) 25 MG tablet Take 1 tablet by mouth 2 (two) times daily.    . Multiple Vitamin (MULTIVITAMIN) tablet Take 1 tablet by mouth daily.    . potassium chloride (KLOR-CON) 8 MEQ tablet Take 1 tablet by mouth daily.    . pravastatin (PRAVACHOL) 80 MG tablet Take 1 tablet by mouth every evening.    Marland Kitchen spironolactone (ALDACTONE) 50 MG tablet Take 1 tablet by mouth daily.    . traZODone (DESYREL) 150 MG tablet Take 1 tablet (150  mg total) by mouth at bedtime. 90 tablet 2  . venlafaxine XR (EFFEXOR-XR) 150 MG 24 hr capsule Take 1 capsule (150 mg total) by mouth daily with breakfast. 90 capsule 2   No current facility-administered medications for this visit.     Medical Decision Making:  Established Problem, Stable/Improving (1), Review of Psycho-Social Stressors (1) and Review of Last Therapy Session (1)  Treatment Plan Summary:Medication management   Depression Continue with venlafaxine 150 mg in the morning  Anxiety Continue with Xanax 0.25 mg- Half pill at bedtime for 2 weeks and then half pill on alternate days and then stop. Patient agreed with the plan.  Patient will continue with trazodone 150 mg at bedtime.  Medications refilled for the next 3 months. 90 day supply of the medications are given.    This note was generated in part or whole with voice recognition software. Voice regonition is usually quite accurate but there are transcription errors that can and very often do occur. I apologize for any typographical errors that were not detected and corrected.   Rainey Pines, MD    05/27/2017, 10:38 AM

## 2017-06-06 DIAGNOSIS — Z0000 Encounter for general adult medical examination without abnormal findings: Secondary | ICD-10-CM | POA: Diagnosis not present

## 2017-06-06 DIAGNOSIS — M542 Cervicalgia: Secondary | ICD-10-CM | POA: Diagnosis not present

## 2017-06-06 DIAGNOSIS — I10 Essential (primary) hypertension: Secondary | ICD-10-CM | POA: Diagnosis not present

## 2017-06-06 DIAGNOSIS — E119 Type 2 diabetes mellitus without complications: Secondary | ICD-10-CM | POA: Diagnosis not present

## 2017-06-10 DIAGNOSIS — J449 Chronic obstructive pulmonary disease, unspecified: Secondary | ICD-10-CM | POA: Diagnosis not present

## 2017-06-20 DIAGNOSIS — J449 Chronic obstructive pulmonary disease, unspecified: Secondary | ICD-10-CM | POA: Diagnosis not present

## 2017-07-11 DIAGNOSIS — J449 Chronic obstructive pulmonary disease, unspecified: Secondary | ICD-10-CM | POA: Diagnosis not present

## 2017-07-20 DIAGNOSIS — J449 Chronic obstructive pulmonary disease, unspecified: Secondary | ICD-10-CM | POA: Diagnosis not present

## 2017-07-29 ENCOUNTER — Ambulatory Visit: Payer: Medicare HMO | Admitting: Psychiatry

## 2017-08-10 DIAGNOSIS — J449 Chronic obstructive pulmonary disease, unspecified: Secondary | ICD-10-CM | POA: Diagnosis not present

## 2017-08-20 DIAGNOSIS — J449 Chronic obstructive pulmonary disease, unspecified: Secondary | ICD-10-CM | POA: Diagnosis not present

## 2017-08-26 ENCOUNTER — Ambulatory Visit: Payer: Medicare HMO | Admitting: Psychiatry

## 2017-08-26 ENCOUNTER — Encounter: Payer: Self-pay | Admitting: Psychiatry

## 2017-08-26 VITALS — BP 118/70 | HR 80 | Ht 63.0 in | Wt 165.0 lb

## 2017-08-26 DIAGNOSIS — F411 Generalized anxiety disorder: Secondary | ICD-10-CM | POA: Diagnosis not present

## 2017-08-26 DIAGNOSIS — F331 Major depressive disorder, recurrent, moderate: Secondary | ICD-10-CM | POA: Diagnosis not present

## 2017-08-26 MED ORDER — VENLAFAXINE HCL ER 150 MG PO CP24: 150.0000 mg | ORAL_CAPSULE | Freq: Every day | ORAL | 2 refills | Status: DC

## 2017-08-26 MED ORDER — MELATONIN 5 MG PO TABS: 5.0000 mg | ORAL_TABLET | Freq: Every day | ORAL | 2 refills | Status: DC

## 2017-08-26 MED ORDER — TRAZODONE HCL 50 MG PO TABS: 50.0000 mg | ORAL_TABLET | Freq: Every day | ORAL | 1 refills | Status: DC

## 2017-08-26 NOTE — Progress Notes (Signed)
BH MD/PA/NP OP Progress Note  08/26/2017 11:06 AM Suzanne Glenn  MRN:  [id number]  Subjective:    Pt  is a 66 year old female who presented for follow-up appointment. She reported that she has been doing well since she has stopped the alprazolam. Patient reported that she has been noticing some upper respiratory symptoms as she continues to smoke on a regular basis. Patient reported that she has an appointment with her pulmonologist on Friday. She reported that she does not have any worsening of her depressive symptoms she continues to take venlafaxine in the morning. She has problems sleeping at at night . She has been taking the trazodone but it is not helpful. We discussed about different medicines and she agreed with  the plan to start taking melatonin the combination of the trazodone. She appeared calm and alert during the interview and continues to use the oxygen tank at this time. She denied having any suicidal homicidal ideations or plans. She denied having any perceptual disturbances at this time. She appeared calm and alert during the interview.   She remains receptive to medication changes at this time.  Chief Complaint:   Visit Diagnosis:     ICD-10-CM   1. MDD (major depressive disorder), recurrent episode, moderate (HCC) F33.1   2. GAD (generalized anxiety disorder) F41.1     Past Medical History:  Past Medical History:  Diagnosis Date  . Anxiety   . Asthma   . COPD (chronic obstructive pulmonary disease) ([place])   . Depression   . Diabetes mellitus, type II ([place])   . Headache   . Hypertension     Past Surgical History:  Procedure Laterality Date  . BACK SURGERY    . cervical bone infusion    . CHOLECYSTECTOMY    . TUBAL LIGATION     Family History:  Family History  Problem Relation Age of Onset  . Anxiety disorder Mother   . Cancer - Lung Mother   . Depression Sister   . Cancer Sister   . Depression Brother   . Cancer Sister   . Cancer Sister   .  Cancer Sister   . Heart Problems Sister   . Bipolar disorder Sister   . Diabetes Sister   . Cancer - Lung Sister   . Hypertension Sister   . Diabetes Sister   . Hyperlipidemia Sister   . COPD Sister   . Heart Problems Brother   . Cancer Brother   . Cancer Brother   . Cancer Brother   . Breast cancer Maternal Aunt    Social History:  Social History   Socioeconomic History  . Marital status: Married    Spouse name: None  . Number of children: None  . Years of education: None  . Highest education level: None  Social Needs  . Financial resource strain: None  . Food insecurity - worry: None  . Food insecurity - inability: None  . Transportation needs - medical: None  . Transportation needs - non-medical: None  Occupational History  . None  Tobacco Use  . Smoking status: Current Every Day Smoker    Packs/day: 1.00    Years: 40.00    Pack years: 40.00    Types: Cigarettes    Start date: 05/05/1975  . Smokeless tobacco: Never Used  Substance and Sexual Activity  . Alcohol use: No    Alcohol/week: 0.0 oz    Comment: socially  . Drug use: No  . Sexual activity: No  Other  Topics Concern  . None  Social History Narrative  . None   Additional History:   She currently lives with her husband. She reported that they live on the same property as her son and his family  Assessment:   Musculoskeletal: Strength & Muscle Tone: within normal limits Gait & Station: normal Patient leans: N/A  Psychiatric Specialty Exam: Medication Refill  Associated symptoms include coughing and myalgias.  Insomnia  PMH includes: no depression.    Review of Systems  Respiratory: Positive for cough, shortness of breath and wheezing.   Musculoskeletal: Positive for back pain and myalgias.  Psychiatric/Behavioral: Negative for depression. The patient is nervous/anxious and has insomnia.   All other systems reviewed and are negative.   Blood pressure 118/70, pulse 80, height 5\' 3"  (1.6  m), weight 165 lb (74.8 kg).Body mass index is 29.23 kg/m.  General Appearance: Casual  Eye Contact:  Fair  Speech:  Clear and Coherent  Volume:  Normal  Mood:  Euthymic  Affect:  Congruent  Thought Process:  Coherent  Orientation:  Full (Time, Place, and Person)  Thought Content:  WDL  Suicidal Thoughts:  No  Homicidal Thoughts:  No  Memory:  Immediate;   Fair  Judgement:  Fair  Insight:  Fair  Psychomotor Activity:  Normal  Concentration:  Fair  Recall:  [employer] of Knowledge: Fair  Language: Fair  Akathisia:  No  Handed:  Right  AIMS (if indicated):    Assets:  Communication Skills Housing Physical Health Social Support  ADL's:  Intact  Cognition: WNL  Sleep:  8-9    Is the patient at risk to self?  No. Has the patient been a risk to self in the past 6 months?  No. Has the patient been a risk to self within the distant past?  No. Is the patient a risk to others?  No. Has the patient been a risk to others in the past 6 months?  No. Has the patient been a risk to others within the distant past?  No.  Current Medications: Current Outpatient Medications  Medication Sig Dispense Refill  . albuterol (PROVENTIL HFA) 108 (90 BASE) MCG/ACT inhaler Inhale into the lungs.    Marland Kitchen albuterol (PROVENTIL) (2.5 MG/3ML) 0.083% nebulizer solution Take 2.5 mg by nebulization every 6 (six) hours as needed for wheezing or shortness of breath.    Marland Kitchen amLODipine (NORVASC) 10 MG tablet Take 1 tablet by mouth daily.    Marland Kitchen aspirin 81 MG chewable tablet Chew 1 tablet by mouth daily.    Marland Kitchen esomeprazole (NEXIUM) 40 MG capsule Take 40 mg by mouth daily.    Marland Kitchen ipratropium (ATROVENT) 0.02 % nebulizer solution Take 2.5 mLs (0.5 mg total) by nebulization every 6 (six) hours as needed for wheezing or shortness of breath. 75 mL 12  . metFORMIN (GLUCOPHAGE) 500 MG tablet Take 1 tablet by mouth every morning.    . metoprolol tartrate (LOPRESSOR) 25 MG tablet Take 1 tablet by mouth 2 (two) times daily.    .  Multiple Vitamin (MULTIVITAMIN) tablet Take 1 tablet by mouth daily.    . potassium chloride (KLOR-CON) 8 MEQ tablet Take 1 tablet by mouth daily.    . pravastatin (PRAVACHOL) 80 MG tablet Take 1 tablet by mouth every evening.    Marland Kitchen spironolactone (ALDACTONE) 50 MG tablet Take 1 tablet by mouth daily.    . traZODone (DESYREL) 50 MG tablet Take 1 tablet (50 mg total) at bedtime by mouth. 90 tablet 1  .  venlafaxine XR (EFFEXOR-XR) 150 MG 24 hr capsule Take 1 capsule (150 mg total) daily with breakfast by mouth. 90 capsule 2  . ACCU-CHEK FASTCLIX LANCETS MISC     . ACCU-CHEK SMARTVIEW test strip     . Melatonin 5 MG TABS Take 1 tablet (5 mg total) at bedtime by mouth. 30 tablet 2   No current facility-administered medications for this visit.     Medical Decision Making:  Established Problem, Stable/Improving (1), Review of Psycho-Social Stressors (1) and Review of Last Therapy Session (1)  Treatment Plan Summary:Medication management   Continue medications as followsedications as follows Continue with venlafaxine 150 mg in the morning  Trazodone 50 mg at bedtime.  Melatonin 5 mg at bedtime.  Medications refilled for the next 3 months.   Follow-up in 3 months or earlier  This note was generated in part or whole with voice recognition software. Voice regonition is usually quite accurate but there are transcription errors that can and very often do occur. I apologize for any typographical errors that were not detected and corrected.   Rainey Pines, MD    08/26/2017, 11:06 AM

## 2017-08-30 DIAGNOSIS — Z23 Encounter for immunization: Secondary | ICD-10-CM | POA: Diagnosis not present

## 2017-08-30 DIAGNOSIS — R0902 Hypoxemia: Secondary | ICD-10-CM | POA: Diagnosis not present

## 2017-08-30 DIAGNOSIS — J449 Chronic obstructive pulmonary disease, unspecified: Secondary | ICD-10-CM | POA: Diagnosis not present

## 2017-08-30 DIAGNOSIS — R0609 Other forms of dyspnea: Secondary | ICD-10-CM | POA: Diagnosis not present

## 2017-09-10 DIAGNOSIS — J449 Chronic obstructive pulmonary disease, unspecified: Secondary | ICD-10-CM | POA: Diagnosis not present

## 2017-09-19 DIAGNOSIS — J449 Chronic obstructive pulmonary disease, unspecified: Secondary | ICD-10-CM | POA: Diagnosis not present

## 2017-09-19 DIAGNOSIS — I10 Essential (primary) hypertension: Secondary | ICD-10-CM | POA: Diagnosis not present

## 2017-09-19 DIAGNOSIS — E119 Type 2 diabetes mellitus without complications: Secondary | ICD-10-CM | POA: Diagnosis not present

## 2017-09-26 DIAGNOSIS — K219 Gastro-esophageal reflux disease without esophagitis: Secondary | ICD-10-CM | POA: Diagnosis not present

## 2017-09-26 DIAGNOSIS — E7800 Pure hypercholesterolemia, unspecified: Secondary | ICD-10-CM | POA: Diagnosis not present

## 2017-09-26 DIAGNOSIS — E119 Type 2 diabetes mellitus without complications: Secondary | ICD-10-CM | POA: Diagnosis not present

## 2017-09-26 DIAGNOSIS — I10 Essential (primary) hypertension: Secondary | ICD-10-CM | POA: Diagnosis not present

## 2017-10-10 DIAGNOSIS — J449 Chronic obstructive pulmonary disease, unspecified: Secondary | ICD-10-CM | POA: Diagnosis not present

## 2017-10-20 DIAGNOSIS — J449 Chronic obstructive pulmonary disease, unspecified: Secondary | ICD-10-CM | POA: Diagnosis not present

## 2017-11-10 DIAGNOSIS — J449 Chronic obstructive pulmonary disease, unspecified: Secondary | ICD-10-CM | POA: Diagnosis not present

## 2017-11-20 DIAGNOSIS — J449 Chronic obstructive pulmonary disease, unspecified: Secondary | ICD-10-CM | POA: Diagnosis not present

## 2017-11-25 ENCOUNTER — Ambulatory Visit: Payer: Self-pay | Admitting: Psychiatry

## 2017-12-11 DIAGNOSIS — J449 Chronic obstructive pulmonary disease, unspecified: Secondary | ICD-10-CM | POA: Diagnosis not present

## 2017-12-18 DIAGNOSIS — J449 Chronic obstructive pulmonary disease, unspecified: Secondary | ICD-10-CM | POA: Diagnosis not present

## 2018-01-08 DIAGNOSIS — J449 Chronic obstructive pulmonary disease, unspecified: Secondary | ICD-10-CM | POA: Diagnosis not present

## 2018-01-18 DIAGNOSIS — J449 Chronic obstructive pulmonary disease, unspecified: Secondary | ICD-10-CM | POA: Diagnosis not present

## 2018-02-08 DIAGNOSIS — J449 Chronic obstructive pulmonary disease, unspecified: Secondary | ICD-10-CM | POA: Diagnosis not present

## 2018-02-17 DIAGNOSIS — J449 Chronic obstructive pulmonary disease, unspecified: Secondary | ICD-10-CM | POA: Diagnosis not present

## 2018-03-10 DIAGNOSIS — J449 Chronic obstructive pulmonary disease, unspecified: Secondary | ICD-10-CM | POA: Diagnosis not present

## 2018-03-20 DIAGNOSIS — J449 Chronic obstructive pulmonary disease, unspecified: Secondary | ICD-10-CM | POA: Diagnosis not present

## 2018-03-21 DIAGNOSIS — E119 Type 2 diabetes mellitus without complications: Secondary | ICD-10-CM | POA: Diagnosis not present

## 2018-03-28 DIAGNOSIS — I10 Essential (primary) hypertension: Secondary | ICD-10-CM | POA: Diagnosis not present

## 2018-03-28 DIAGNOSIS — E119 Type 2 diabetes mellitus without complications: Secondary | ICD-10-CM | POA: Diagnosis not present

## 2018-04-10 DIAGNOSIS — J449 Chronic obstructive pulmonary disease, unspecified: Secondary | ICD-10-CM | POA: Diagnosis not present

## 2018-04-19 DIAGNOSIS — J449 Chronic obstructive pulmonary disease, unspecified: Secondary | ICD-10-CM | POA: Diagnosis not present

## 2018-05-10 DIAGNOSIS — J449 Chronic obstructive pulmonary disease, unspecified: Secondary | ICD-10-CM | POA: Diagnosis not present

## 2018-05-20 DIAGNOSIS — J449 Chronic obstructive pulmonary disease, unspecified: Secondary | ICD-10-CM | POA: Diagnosis not present

## 2018-06-10 DIAGNOSIS — J449 Chronic obstructive pulmonary disease, unspecified: Secondary | ICD-10-CM | POA: Diagnosis not present

## 2018-06-20 DIAGNOSIS — J449 Chronic obstructive pulmonary disease, unspecified: Secondary | ICD-10-CM | POA: Diagnosis not present

## 2018-06-23 DIAGNOSIS — I10 Essential (primary) hypertension: Secondary | ICD-10-CM | POA: Diagnosis not present

## 2018-06-30 ENCOUNTER — Other Ambulatory Visit: Payer: Self-pay | Admitting: Internal Medicine

## 2018-06-30 DIAGNOSIS — Z0000 Encounter for general adult medical examination without abnormal findings: Secondary | ICD-10-CM | POA: Diagnosis not present

## 2018-06-30 DIAGNOSIS — Z1231 Encounter for screening mammogram for malignant neoplasm of breast: Secondary | ICD-10-CM

## 2018-06-30 DIAGNOSIS — G2581 Restless legs syndrome: Secondary | ICD-10-CM | POA: Diagnosis not present

## 2018-06-30 DIAGNOSIS — J449 Chronic obstructive pulmonary disease, unspecified: Secondary | ICD-10-CM | POA: Diagnosis not present

## 2018-06-30 DIAGNOSIS — Z1239 Encounter for other screening for malignant neoplasm of breast: Secondary | ICD-10-CM | POA: Diagnosis not present

## 2018-06-30 DIAGNOSIS — E119 Type 2 diabetes mellitus without complications: Secondary | ICD-10-CM | POA: Diagnosis not present

## 2018-06-30 DIAGNOSIS — Z23 Encounter for immunization: Secondary | ICD-10-CM | POA: Diagnosis not present

## 2018-07-11 DIAGNOSIS — J449 Chronic obstructive pulmonary disease, unspecified: Secondary | ICD-10-CM | POA: Diagnosis not present

## 2018-07-20 DIAGNOSIS — J449 Chronic obstructive pulmonary disease, unspecified: Secondary | ICD-10-CM | POA: Diagnosis not present

## 2018-08-10 DIAGNOSIS — J449 Chronic obstructive pulmonary disease, unspecified: Secondary | ICD-10-CM | POA: Diagnosis not present

## 2018-08-11 ENCOUNTER — Ambulatory Visit: Payer: Medicare HMO | Admitting: Psychiatry

## 2018-08-11 ENCOUNTER — Encounter: Payer: Self-pay | Admitting: Psychiatry

## 2018-08-11 DIAGNOSIS — F331 Major depressive disorder, recurrent, moderate: Secondary | ICD-10-CM | POA: Diagnosis not present

## 2018-08-11 DIAGNOSIS — F411 Generalized anxiety disorder: Secondary | ICD-10-CM | POA: Diagnosis not present

## 2018-08-11 MED ORDER — VENLAFAXINE HCL ER 150 MG PO CP24: 150.0000 mg | ORAL_CAPSULE | Freq: Every day | ORAL | 1 refills | Status: DC

## 2018-08-11 MED ORDER — GABAPENTIN 100 MG PO CAPS: 100.0000 mg | ORAL_CAPSULE | Freq: Two times a day (BID) | ORAL | 1 refills | Status: DC

## 2018-08-11 NOTE — Progress Notes (Signed)
BH MD/PA/NP OP Progress Note  08/11/2018 11:14 AM Suzanne Glenn  MRN:  [id number]  Subjective:    Pt  is a 67 year old female who presented for follow-up appointment. Last seen in 11/18. She reported that she has been doing well and had the refills on her medication and was taking them as prescribed.  Patient reported that she continues to take venlafaxine on a regular basis.  Patient reported that she is recently had her physical exam done and all the labs are also done at that time.  Patient reported that she continues to have problems with sleep as well as restless legs.  She has a stop taking the trazodone and melatonin as they were not helpful.  She reported that she was started on Requip by her PCP but the medication is not helpful.  She feels that her legs are especially bothering her at night.  She has never tried Neurontin in the past and is willing to try the medication at this time.  Patient reported that when she does not sleep she starts smoking.  She is helping her great grandchild at this time who will stay with them during the daytime.  He is only 12-1/66 years old.  She reported that she spends time with him.  Patient currently denied having any suicidal homicidal ideations or plans.  She has good relationship with her family members.    She appeared calm and alert during the interview.   She remains receptive to medication changes at this time.  Chief Complaint:   Visit Diagnosis:     ICD-10-CM   1. MDD (major depressive disorder), recurrent episode, moderate (HCC) F33.1   2. GAD (generalized anxiety disorder) F41.1     Past Medical History:  Past Medical History:  Diagnosis Date  . Anxiety   . Asthma   . COPD (chronic obstructive pulmonary disease) ([place])   . Depression   . Diabetes mellitus, type II ([place])   . Headache   . Hypertension     Past Surgical History:  Procedure Laterality Date  . BACK SURGERY    . cervical bone infusion    . CHOLECYSTECTOMY    .  TUBAL LIGATION     Family History:  Family History  Problem Relation Age of Onset  . Anxiety disorder Mother   . Cancer - Lung Mother   . Depression Sister   . Cancer Sister   . Depression Brother   . Cancer Sister   . Cancer Sister   . Cancer Sister   . Heart Problems Sister   . Bipolar disorder Sister   . Diabetes Sister   . Cancer - Lung Sister   . Hypertension Sister   . Diabetes Sister   . Hyperlipidemia Sister   . COPD Sister   . Heart Problems Brother   . Cancer Brother   . Cancer Brother   . Cancer Brother   . Breast cancer Maternal Aunt    Social History:  Social History   Socioeconomic History  . Marital status: Married    Spouse name: Not on file  . Number of children: Not on file  . Years of education: Not on file  . Highest education level: Not on file  Occupational History  . Not on file  Social Needs  . Financial resource strain: Not on file  . Food insecurity:    Worry: Not on file    Inability: Not on file  . Transportation needs:    Medical: Not  on file    Non-medical: Not on file  Tobacco Use  . Smoking status: Current Every Day Smoker    Packs/day: 1.00    Years: 40.00    Pack years: 40.00    Types: Cigarettes    Start date: 05/05/1975  . Smokeless tobacco: Never Used  Substance and Sexual Activity  . Alcohol use: No    Alcohol/week: 0.0 standard drinks    Comment: socially  . Drug use: No  . Sexual activity: Never  Lifestyle  . Physical activity:    Days per week: Not on file    Minutes per session: Not on file  . Stress: Not on file  Relationships  . Social connections:    Talks on phone: Not on file    Gets together: Not on file    Attends religious service: Not on file    Active member of club or organization: Not on file    Attends meetings of clubs or organizations: Not on file    Relationship status: Not on file  Other Topics Concern  . Not on file  Social History Narrative  . Not on file   Additional History:    She currently lives with her husband. She reported that they live on the same property as her son and his family  Assessment:   Musculoskeletal: Strength & Muscle Tone: within normal limits Gait & Station: normal Patient leans: N/A  Psychiatric Specialty Exam: Medication Refill  Associated symptoms include coughing and myalgias.  Insomnia  PMH includes: no depression.    Review of Systems  Respiratory: Positive for cough, shortness of breath and wheezing.   Musculoskeletal: Positive for back pain and myalgias.  Psychiatric/Behavioral: Negative for depression. The patient is nervous/anxious and has insomnia.   All other systems reviewed and are negative.   There were no vitals taken for this visit.There is no height or weight on file to calculate BMI.  General Appearance: Casual  Eye Contact:  Fair  Speech:  Clear and Coherent  Volume:  Normal  Mood:  Euthymic  Affect:  Congruent  Thought Process:  Coherent  Orientation:  Full (Time, Place, and Person)  Thought Content:  WDL  Suicidal Thoughts:  No  Homicidal Thoughts:  No  Memory:  Immediate;   Fair  Judgement:  Fair  Insight:  Fair  Psychomotor Activity:  Normal  Concentration:  Fair  Recall:  [employer] of Knowledge: Fair  Language: Fair  Akathisia:  No  Handed:  Right  AIMS (if indicated):    Assets:  Communication Skills Housing Physical Health Social Support  ADL's:  Intact  Cognition: WNL  Sleep:  8-9    Is the patient at risk to self?  No. Has the patient been a risk to self in the past 6 months?  No. Has the patient been a risk to self within the distant past?  No. Is the patient a risk to others?  No. Has the patient been a risk to others in the past 6 months?  No. Has the patient been a risk to others within the distant past?  No.  Current Medications: Current Outpatient Medications  Medication Sig Dispense Refill  . ACCU-CHEK FASTCLIX LANCETS MISC     . ACCU-CHEK SMARTVIEW test strip      . albuterol (PROVENTIL HFA) 108 (90 BASE) MCG/ACT inhaler Inhale into the lungs.    Marland Kitchen albuterol (PROVENTIL) (2.5 MG/3ML) 0.083% nebulizer solution Take 2.5 mg by nebulization every 6 (six) hours as  needed for wheezing or shortness of breath.    Marland Kitchen amLODipine (NORVASC) 10 MG tablet Take 1 tablet by mouth daily.    Marland Kitchen aspirin 81 MG chewable tablet Chew 1 tablet by mouth daily.    Marland Kitchen esomeprazole (NEXIUM) 40 MG capsule Take 40 mg by mouth daily.    Marland Kitchen ipratropium (ATROVENT) 0.02 % nebulizer solution Take 2.5 mLs (0.5 mg total) by nebulization every 6 (six) hours as needed for wheezing or shortness of breath. 75 mL 12  . Melatonin 5 MG TABS Take 1 tablet (5 mg total) at bedtime by mouth. 30 tablet 2  . metFORMIN (GLUCOPHAGE) 500 MG tablet Take 1 tablet by mouth every morning.    . metoprolol tartrate (LOPRESSOR) 25 MG tablet Take 1 tablet by mouth 2 (two) times daily.    . Multiple Vitamin (MULTIVITAMIN) tablet Take 1 tablet by mouth daily.    . potassium chloride (KLOR-CON) 8 MEQ tablet Take 1 tablet by mouth daily.    . pravastatin (PRAVACHOL) 80 MG tablet Take 1 tablet by mouth every evening.    Marland Kitchen spironolactone (ALDACTONE) 50 MG tablet Take 1 tablet by mouth daily.    . traZODone (DESYREL) 50 MG tablet Take 1 tablet (50 mg total) at bedtime by mouth. 90 tablet 1  . venlafaxine XR (EFFEXOR-XR) 150 MG 24 hr capsule Take 1 capsule (150 mg total) daily with breakfast by mouth. 90 capsule 2   No current facility-administered medications for this visit.     Medical Decision Making:  Established Problem, Stable/Improving (1), Review of Psycho-Social Stressors (1) and Review of Last Therapy Session (1)  Treatment Plan Summary:Medication management   Continue medications as followsedications as follows Continue with venlafaxine 150 mg in the morning  We will start her on Neurontin 100 mg at bedtime and she will gradually titrate the dose to 100 twice daily.  Discussed with her about the side effects  of the medication in detail and she agreed with the plan.  Follow-up in 1 months or earlier  This note was generated in part or whole with voice recognition software. Voice regonition is usually quite accurate but there are transcription errors that can and very often do occur. I apologize for any typographical errors that were not detected and corrected.   Rainey Pines, MD    08/11/2018, 11:14 AM

## 2018-08-20 DIAGNOSIS — J449 Chronic obstructive pulmonary disease, unspecified: Secondary | ICD-10-CM | POA: Diagnosis not present

## 2018-09-01 ENCOUNTER — Encounter: Payer: Self-pay | Admitting: Psychiatry

## 2018-09-01 ENCOUNTER — Other Ambulatory Visit: Payer: Self-pay

## 2018-09-01 ENCOUNTER — Ambulatory Visit: Payer: Medicare HMO | Admitting: Psychiatry

## 2018-09-01 VITALS — BP 123/73 | HR 90 | Temp 98.8°F | Wt 184.6 lb

## 2018-09-01 DIAGNOSIS — F411 Generalized anxiety disorder: Secondary | ICD-10-CM

## 2018-09-01 DIAGNOSIS — F331 Major depressive disorder, recurrent, moderate: Secondary | ICD-10-CM | POA: Diagnosis not present

## 2018-09-01 MED ORDER — GABAPENTIN 100 MG PO CAPS: 100.0000 mg | ORAL_CAPSULE | Freq: Every morning | ORAL | 1 refills | Status: DC

## 2018-09-01 MED ORDER — GABAPENTIN 300 MG PO CAPS: 300.0000 mg | ORAL_CAPSULE | Freq: Every day | ORAL | 2 refills | Status: DC

## 2018-09-01 NOTE — Progress Notes (Signed)
BH MD/PA/NP OP Progress Note  09/01/2018 10:25 AM Suzanne Glenn  MRN:  [id number]  Subjective:    Pt  is a 67 year old female who presented for follow-up appointment.  She reported that she used to have restless legs.  She reported that she has started taking the Neurontin 100 mg twice daily and has not been helpful as much.  She is also taking the Requip which was prescribed to her.  Patient reported that she felt better only for 1 day.  She reported that she smokes more than 1 pack of cigarettes on a daily basis.  We discussed about decreasing the amount of cigarette smoking.  She reported that she feels anxious throughout the day.  She reported that she has been having difficulty with restless legs throughout the night and is unable to rest well.  She is interested in going higher on the dose of the Neurontin at this time.  She denied having any suicidal homicidal ideations or plans.  She reported that she plans to cook on  the Thanksgiving dinner as her family is coming.  She is able to contract for safety at this time.     Asked with patient at length about the smoking cessation and she agreed with the plan.  No acute symptoms noted.  She is compliant with her medications.      She appeared calm and alert during the interview.   She remains receptive to medication changes at this time.  Chief Complaint:  Chief Complaint    Follow-up; Medication Refill     Visit Diagnosis:     ICD-10-CM   1. MDD (major depressive disorder), recurrent episode, moderate (HCC) F33.1   2. GAD (generalized anxiety disorder) F41.1     Past Medical History:  Past Medical History:  Diagnosis Date  . Anxiety   . Asthma   . COPD (chronic obstructive pulmonary disease) ([place])   . Depression   . Diabetes mellitus, type II ([place])   . Headache   . Hypertension     Past Surgical History:  Procedure Laterality Date  . BACK SURGERY    . cervical bone infusion    . CHOLECYSTECTOMY    . TUBAL  LIGATION     Family History:  Family History  Problem Relation Age of Onset  . Anxiety disorder Mother   . Cancer - Lung Mother   . Depression Sister   . Cancer Sister   . Depression Brother   . Cancer Sister   . Cancer Sister   . Cancer Sister   . Heart Problems Sister   . Bipolar disorder Sister   . Diabetes Sister   . Cancer - Lung Sister   . Hypertension Sister   . Diabetes Sister   . Hyperlipidemia Sister   . COPD Sister   . Heart Problems Brother   . Cancer Brother   . Cancer Brother   . Cancer Brother   . Breast cancer Maternal Aunt    Social History:  Social History   Socioeconomic History  . Marital status: Married    Spouse name: Not on file  . Number of children: Not on file  . Years of education: Not on file  . Highest education level: Not on file  Occupational History  . Not on file  Social Needs  . Financial resource strain: Not on file  . Food insecurity:    Worry: Not on file    Inability: Not on file  . Transportation needs:  Medical: Not on file    Non-medical: Not on file  Tobacco Use  . Smoking status: Current Every Day Smoker    Packs/day: 1.00    Years: 40.00    Pack years: 40.00    Types: Cigarettes    Start date: 05/05/1975  . Smokeless tobacco: Never Used  Substance and Sexual Activity  . Alcohol use: No    Alcohol/week: 0.0 standard drinks    Comment: socially  . Drug use: No  . Sexual activity: Never  Lifestyle  . Physical activity:    Days per week: Not on file    Minutes per session: Not on file  . Stress: Not on file  Relationships  . Social connections:    Talks on phone: Not on file    Gets together: Not on file    Attends religious service: Not on file    Active member of club or organization: Not on file    Attends meetings of clubs or organizations: Not on file    Relationship status: Not on file  Other Topics Concern  . Not on file  Social History Narrative  . Not on file   Additional History:   She  currently lives with her husband. She reported that they live on the same property as her son and his family  Assessment:   Musculoskeletal: Strength & Muscle Tone: within normal limits Gait & Station: normal Patient leans: N/A  Psychiatric Specialty Exam: Medication Refill  Associated symptoms include coughing and myalgias.  Insomnia  PMH includes: no depression.    Review of Systems  Respiratory: Positive for cough, shortness of breath and wheezing.   Musculoskeletal: Positive for back pain and myalgias.  Psychiatric/Behavioral: Negative for depression. The patient is nervous/anxious and has insomnia.   All other systems reviewed and are negative.   Blood pressure 123/73, pulse 90, temperature 98.8 F (37.1 C), temperature source Oral, weight 184 lb 9.6 oz (83.7 kg).Body mass index is 32.7 kg/m.  General Appearance: Casual  Eye Contact:  Fair  Speech:  Clear and Coherent  Volume:  Normal  Mood:  Euthymic  Affect:  Congruent  Thought Process:  Coherent  Orientation:  Full (Time, Place, and Person)  Thought Content:  WDL  Suicidal Thoughts:  No  Homicidal Thoughts:  No  Memory:  Immediate;   Fair  Judgement:  Fair  Insight:  Fair  Psychomotor Activity:  Normal  Concentration:  Fair  Recall:  [employer] of Knowledge: Fair  Language: Fair  Akathisia:  No  Handed:  Right  AIMS (if indicated):    Assets:  Communication Skills Housing Physical Health Social Support  ADL's:  Intact  Cognition: WNL  Sleep:  8-9    Is the patient at risk to self?  No. Has the patient been a risk to self in the past 6 months?  No. Has the patient been a risk to self within the distant past?  No. Is the patient a risk to others?  No. Has the patient been a risk to others in the past 6 months?  No. Has the patient been a risk to others within the distant past?  No.  Current Medications: Current Outpatient Medications  Medication Sig Dispense Refill  . ACCU-CHEK FASTCLIX LANCETS  MISC     . ACCU-CHEK SMARTVIEW test strip     . albuterol (PROVENTIL HFA) 108 (90 BASE) MCG/ACT inhaler Inhale into the lungs.    Marland Kitchen albuterol (PROVENTIL) (2.5 MG/3ML) 0.083% nebulizer solution Take  2.5 mg by nebulization every 6 (six) hours as needed for wheezing or shortness of breath.    Marland Kitchen amLODipine (NORVASC) 10 MG tablet Take 1 tablet by mouth daily.    Marland Kitchen aspirin 81 MG chewable tablet Chew 1 tablet by mouth daily.    Marland Kitchen esomeprazole (NEXIUM) 40 MG capsule Take 40 mg by mouth daily.    Marland Kitchen gabapentin (NEURONTIN) 100 MG capsule Take 1 capsule (100 mg total) by mouth 2 (two) times daily. 60 capsule 1  . ipratropium (ATROVENT) 0.02 % nebulizer solution Take 2.5 mLs (0.5 mg total) by nebulization every 6 (six) hours as needed for wheezing or shortness of breath. 75 mL 12  . metFORMIN (GLUCOPHAGE) 500 MG tablet Take 1 tablet by mouth every morning.    . metoprolol tartrate (LOPRESSOR) 25 MG tablet Take 1 tablet by mouth 2 (two) times daily.    . Multiple Vitamin (MULTIVITAMIN) tablet Take 1 tablet by mouth daily.    . potassium chloride (KLOR-CON) 8 MEQ tablet Take 1 tablet by mouth daily.    . pravastatin (PRAVACHOL) 80 MG tablet Take 1 tablet by mouth every evening.    Marland Kitchen rOPINIRole (REQUIP) 0.25 MG tablet Take 1 tablet every evening for 2 days, then increase to 2 tablets every evening.    Marland Kitchen spironolactone (ALDACTONE) 50 MG tablet Take 1 tablet by mouth daily.    Marland Kitchen venlafaxine XR (EFFEXOR-XR) 150 MG 24 hr capsule Take 1 capsule (150 mg total) by mouth daily with breakfast. 90 capsule 1   No current facility-administered medications for this visit.     Medical Decision Making:  Established Problem, Stable/Improving (1), Review of Psycho-Social Stressors (1) and Review of Last Therapy Session (1)  Treatment Plan Summary:Medication management   Continue medications as followsedications as follows Continue with venlafaxine 150 mg in the morning  We will start her on Neurontin 100 mg at in the  morning and 300 mg at bedtime.  She agreed with the plan.  Follow-up in 2 months or earlier  This note was generated in part or whole with voice recognition software. Voice regonition is usually quite accurate but there are transcription errors that can and very often do occur. I apologize for any typographical errors that were not detected and corrected.   Rainey Pines, MD    09/01/2018, 10:25 AM

## 2018-09-10 DIAGNOSIS — J449 Chronic obstructive pulmonary disease, unspecified: Secondary | ICD-10-CM | POA: Diagnosis not present

## 2018-09-19 DIAGNOSIS — J449 Chronic obstructive pulmonary disease, unspecified: Secondary | ICD-10-CM | POA: Diagnosis not present

## 2018-10-10 DIAGNOSIS — J449 Chronic obstructive pulmonary disease, unspecified: Secondary | ICD-10-CM | POA: Diagnosis not present

## 2018-10-20 DIAGNOSIS — J449 Chronic obstructive pulmonary disease, unspecified: Secondary | ICD-10-CM | POA: Diagnosis not present

## 2018-10-22 DIAGNOSIS — E119 Type 2 diabetes mellitus without complications: Secondary | ICD-10-CM | POA: Diagnosis not present

## 2018-10-29 DIAGNOSIS — G2581 Restless legs syndrome: Secondary | ICD-10-CM | POA: Diagnosis not present

## 2018-10-29 DIAGNOSIS — E119 Type 2 diabetes mellitus without complications: Secondary | ICD-10-CM | POA: Diagnosis not present

## 2018-11-03 ENCOUNTER — Other Ambulatory Visit: Payer: Self-pay

## 2018-11-03 ENCOUNTER — Encounter: Payer: Self-pay | Admitting: Psychiatry

## 2018-11-03 ENCOUNTER — Ambulatory Visit: Payer: Medicare HMO | Admitting: Psychiatry

## 2018-11-03 VITALS — BP 104/62 | HR 93 | Temp 97.7°F | Wt 188.0 lb

## 2018-11-03 DIAGNOSIS — F411 Generalized anxiety disorder: Secondary | ICD-10-CM

## 2018-11-03 DIAGNOSIS — F331 Major depressive disorder, recurrent, moderate: Secondary | ICD-10-CM

## 2018-11-03 MED ORDER — GABAPENTIN 400 MG PO CAPS: 400.0000 mg | ORAL_CAPSULE | Freq: Every day | ORAL | 2 refills | Status: DC

## 2018-11-03 MED ORDER — GABAPENTIN 100 MG PO CAPS: ORAL_CAPSULE | ORAL | 2 refills | Status: DC

## 2018-11-03 MED ORDER — VENLAFAXINE HCL ER 150 MG PO CP24: 150.0000 mg | ORAL_CAPSULE | Freq: Every day | ORAL | 1 refills | Status: DC

## 2018-11-03 NOTE — Progress Notes (Signed)
BH MD/PA/NP OP Progress Note  11/03/2018 10:35 AM ERIYANNA Glenn  MRN:  [id number]  Subjective:    Pt  is a 68 year old female who presented for follow-up appointment.  She has been using oxygen on a regular basis.  Patient reported that she continues to have restless legs and gabapentin has been helpful.  She reported that she feels that her legs are hurting and she spoke to her primary care physician who have advised her to decrease the intake of sodium.  Patient reported that she wants to go higher on the dose of gabapentin.  We discussed about the medication and she has been taking  gabapentin 100 mg in the morning and 300 at bedtime.  She reported that it is also helping with her anxiety.  Patient currently denied having any suicidal homicidal ideations or plans.  She appeared calm and alert during the interview.  Denies having any depressive symptoms.She is also taking the Requip which was prescribed to her.   She reported that she has been having difficulty with restless legs throughout the night and is unable to rest well.  She is interested in going higher on the dose of the Neurontin at this time.    Asked with patient at length about the smoking cessation and she agreed with the plan.  No acute symptoms noted.  She is compliant with her medications.      She appeared calm and alert during the interview.   She remains receptive to medication changes at this time.  Chief Complaint:  Chief Complaint    Follow-up; Medication Refill     Visit Diagnosis:     ICD-10-CM   1. MDD (major depressive disorder), recurrent episode, moderate (HCC) F33.1   2. GAD (generalized anxiety disorder) F41.1     Past Medical History:  Past Medical History:  Diagnosis Date  . Anxiety   . Asthma   . COPD (chronic obstructive pulmonary disease) ([place])   . Depression   . Diabetes mellitus, type II ([place] Glenn)   . Headache   . Hypertension     Past Surgical History:  Procedure Laterality Date  .  BACK SURGERY    . cervical bone infusion    . CHOLECYSTECTOMY    . TUBAL LIGATION     Family History:  Family History  Problem Relation Age of Onset  . Anxiety disorder Mother   . Cancer - Lung Mother   . Depression Sister   . Cancer Sister   . Depression Brother   . Cancer Sister   . Cancer Sister   . Cancer Sister   . Heart Problems Sister   . Bipolar disorder Sister   . Diabetes Sister   . Cancer - Lung Sister   . Hypertension Sister   . Diabetes Sister   . Hyperlipidemia Sister   . COPD Sister   . Heart Problems Brother   . Cancer Brother   . Cancer Brother   . Cancer Brother   . Breast cancer Maternal Aunt    Social History:  Social History   Socioeconomic History  . Marital status: Married    Spouse name: Not on file  . Number of children: Not on file  . Years of education: Not on file  . Highest education level: Not on file  Occupational History  . Not on file  Social Needs  . Financial resource strain: Not on file  . Food insecurity:    Worry: Not on file    Inability:  Not on file  . Transportation needs:    Medical: Not on file    Non-medical: Not on file  Tobacco Use  . Smoking status: Current Every Day Smoker    Packs/day: 1.00    Years: 40.00    Pack years: 40.00    Types: Cigarettes    Start date: 05/05/1975  . Smokeless tobacco: Never Used  Substance and Sexual Activity  . Alcohol use: No    Alcohol/week: 0.0 standard drinks    Comment: socially  . Drug use: No  . Sexual activity: Never  Lifestyle  . Physical activity:    Days per week: Not on file    Minutes per session: Not on file  . Stress: Not on file  Relationships  . Social connections:    Talks on phone: Not on file    Gets together: Not on file    Attends religious service: Not on file    Active member of club or organization: Not on file    Attends meetings of clubs or organizations: Not on file    Relationship status: Not on file  Other Topics Concern  . Not on file   Social History Narrative  . Not on file   Additional History:   She currently lives with her husband. She reported that they live on the same property as her son and his family  Assessment:   Musculoskeletal: Strength & Muscle Tone: within normal limits Gait & Station: normal Patient leans: N/A  Psychiatric Specialty Exam: Medication Refill  Associated symptoms include coughing and myalgias.  Insomnia  PMH includes: no depression.    Review of Systems  Respiratory: Positive for cough, shortness of breath and wheezing.   Musculoskeletal: Positive for back pain and myalgias.  Psychiatric/Behavioral: Negative for depression. The patient is nervous/anxious and has insomnia.   All other systems reviewed and are negative.   Blood pressure 104/62, pulse 93, temperature 97.7 F (36.5 C), temperature source Oral, weight 188 lb (85.3 kg), SpO2 94 %.Body mass index is 33.3 kg/m.  General Appearance: Casual  Eye Contact:  Fair  Speech:  Clear and Coherent  Volume:  Normal  Mood:  Euthymic  Affect:  Congruent  Thought Process:  Coherent  Orientation:  Full (Time, Place, and Person)  Thought Content:  WDL  Suicidal Thoughts:  No  Homicidal Thoughts:  No  Memory:  Immediate;   Fair  Judgement:  Fair  Insight:  Fair  Psychomotor Activity:  Normal  Concentration:  Fair  Recall:  [employer] of Knowledge: Fair  Language: Fair  Akathisia:  No  Handed:  Right  AIMS (if indicated):    Assets:  Communication Skills Housing Physical Health Social Support  ADL's:  Intact  Cognition: WNL  Sleep:  8-9    Is the patient at risk to self?  No. Has the patient been a risk to self in the past 6 months?  No. Has the patient been a risk to self within the distant past?  No. Is the patient a risk to others?  No. Has the patient been a risk to others in the past 6 months?  No. Has the patient been a risk to others within the distant past?  No.  Current Medications: Current Outpatient  Medications  Medication Sig Dispense Refill  . ACCU-CHEK FASTCLIX LANCETS MISC     . ACCU-CHEK SMARTVIEW test strip     . albuterol (PROVENTIL HFA) 108 (90 BASE) MCG/ACT inhaler Inhale into the lungs.    Marland Kitchen  albuterol (PROVENTIL) (2.5 MG/3ML) 0.083% nebulizer solution Take 2.5 mg by nebulization every 6 (six) hours as needed for wheezing or shortness of breath.    Marland Kitchen amLODipine (NORVASC) 10 MG tablet Take 1 tablet by mouth daily.    Marland Kitchen aspirin 81 MG chewable tablet Chew 1 tablet by mouth daily.    Marland Kitchen esomeprazole (NEXIUM) 40 MG capsule Take 40 mg by mouth daily.    Marland Kitchen gabapentin (NEURONTIN) 100 MG capsule Take 1 capsule (100 mg total) by mouth every morning. 30 capsule 1  . gabapentin (NEURONTIN) 300 MG capsule Take 1 capsule (300 mg total) by mouth at bedtime. 30 capsule 2  . ipratropium (ATROVENT) 0.02 % nebulizer solution Take 2.5 mLs (0.5 mg total) by nebulization every 6 (six) hours as needed for wheezing or shortness of breath. 75 mL 12  . metFORMIN (GLUCOPHAGE) 500 MG tablet Take 1 tablet by mouth every morning.    . metoprolol tartrate (LOPRESSOR) 25 MG tablet Take 1 tablet by mouth 2 (two) times daily.    . Multiple Vitamin (MULTIVITAMIN) tablet Take 1 tablet by mouth daily.    . potassium chloride (KLOR-CON) 8 MEQ tablet Take 1 tablet by mouth daily.    . pravastatin (PRAVACHOL) 80 MG tablet Take 1 tablet by mouth every evening.    Marland Kitchen rOPINIRole (REQUIP) 0.25 MG tablet Take 1 tablet every evening for 2 days, then increase to 2 tablets every evening.    Marland Kitchen SPIRIVA HANDIHALER 18 MCG inhalation capsule     . spironolactone (ALDACTONE) 50 MG tablet Take 1 tablet by mouth daily.    Marland Kitchen venlafaxine XR (EFFEXOR-XR) 150 MG 24 hr capsule Take 1 capsule (150 mg total) by mouth daily with breakfast. 90 capsule 1   No current facility-administered medications for this visit.     Medical Decision Making:  Established Problem, Stable/Improving (1), Review of Psycho-Social Stressors (1) and Review of Last  Therapy Session (1)  Treatment Plan Summary:Medication management   Continue medications as followsedications as follows Continue with venlafaxine 150 mg in the morning  We will start her on Neurontin 100 mg twice daily and 400 mg at bedtime.  She agreed with the plan. Follow-up in 1  months or earlier  This note was generated in part or whole with voice recognition software. Voice regonition is usually quite accurate but there are transcription errors that can and very often do occur. I apologize for any typographical errors that were not detected and corrected.   Rainey Pines, MD    11/03/2018, 10:35 AM

## 2018-11-10 DIAGNOSIS — J449 Chronic obstructive pulmonary disease, unspecified: Secondary | ICD-10-CM | POA: Diagnosis not present

## 2018-11-20 DIAGNOSIS — J449 Chronic obstructive pulmonary disease, unspecified: Secondary | ICD-10-CM | POA: Diagnosis not present

## 2018-11-28 DIAGNOSIS — R0602 Shortness of breath: Secondary | ICD-10-CM | POA: Diagnosis not present

## 2018-11-28 DIAGNOSIS — Z9981 Dependence on supplemental oxygen: Secondary | ICD-10-CM | POA: Diagnosis not present

## 2018-11-28 DIAGNOSIS — J984 Other disorders of lung: Secondary | ICD-10-CM | POA: Diagnosis not present

## 2018-11-28 DIAGNOSIS — R9389 Abnormal findings on diagnostic imaging of other specified body structures: Secondary | ICD-10-CM | POA: Diagnosis not present

## 2018-11-28 DIAGNOSIS — R911 Solitary pulmonary nodule: Secondary | ICD-10-CM | POA: Diagnosis not present

## 2018-11-28 DIAGNOSIS — J449 Chronic obstructive pulmonary disease, unspecified: Secondary | ICD-10-CM | POA: Diagnosis not present

## 2018-11-28 DIAGNOSIS — R05 Cough: Secondary | ICD-10-CM | POA: Diagnosis not present

## 2018-12-01 ENCOUNTER — Ambulatory Visit (INDEPENDENT_AMBULATORY_CARE_PROVIDER_SITE_OTHER): Payer: Medicare HMO | Admitting: Psychiatry

## 2018-12-01 ENCOUNTER — Encounter: Payer: Self-pay | Admitting: Psychiatry

## 2018-12-01 ENCOUNTER — Other Ambulatory Visit: Payer: Self-pay

## 2018-12-01 VITALS — BP 120/66 | HR 97 | Temp 98.7°F | Wt 187.0 lb

## 2018-12-01 DIAGNOSIS — F411 Generalized anxiety disorder: Secondary | ICD-10-CM | POA: Diagnosis not present

## 2018-12-01 DIAGNOSIS — F331 Major depressive disorder, recurrent, moderate: Secondary | ICD-10-CM | POA: Diagnosis not present

## 2018-12-01 MED ORDER — GABAPENTIN 400 MG PO CAPS: 400.0000 mg | ORAL_CAPSULE | Freq: Two times a day (BID) | ORAL | 2 refills | Status: DC

## 2018-12-01 MED ORDER — GABAPENTIN 100 MG PO CAPS: ORAL_CAPSULE | ORAL | 2 refills | Status: DC

## 2018-12-01 MED ORDER — VENLAFAXINE HCL ER 150 MG PO CP24: 150.0000 mg | ORAL_CAPSULE | Freq: Every day | ORAL | 1 refills | Status: DC

## 2018-12-01 NOTE — Progress Notes (Signed)
BH MD/PA/NP OP Progress Note  12/01/2018 10:13 AM Suzanne Glenn  MRN:  [id number]  Subjective:    Pt  is a 68 year old female who presented for follow-up appointment.  She has been using oxygen on a regular basis.  Patient reported that she continues to have restless legs and gabapentin has really been helpful.  She reported that she has more days when she feels restlessness in her legs.  She is also taking Requip for the same.  Patient reported that she is unable to sleep at night when she feels restlessness.  She reported that sometimes during the day she is unable to sit as well.  Patient reported that she has been trying to take her medications as prescribed.  We discussed about increasing the dose of the gabapentin and she is in agreement with the plan.  Patient reported that her labs were done 1 month ago and everything was within normal limits including her kidney function.   She was also talking about taking care of her grandchild 4 days/week at home.  She reported that she and her husband enjoys babysitting and he is now 67 years old.  She stated that they have good time with the baby.  She reported that he is very active and keep them busy during the weekdays.  Patient appears calm and alert during the interview.  No other acute symptoms noted.  She is receptive to her medication changes at this time.  Chief Complaint:  Chief Complaint    Follow-up     Visit Diagnosis:     ICD-10-CM   1. MDD (major depressive disorder), recurrent episode, moderate (HCC) F33.1   2. GAD (generalized anxiety disorder) F41.1     Past Medical History:  Past Medical History:  Diagnosis Date  . Anxiety   . Asthma   . COPD (chronic obstructive pulmonary disease) ([place])   . Depression   . Diabetes mellitus, type II ([place])   . Headache   . Hypertension     Past Surgical History:  Procedure Laterality Date  . BACK SURGERY    . cervical bone infusion    . CHOLECYSTECTOMY    . TUBAL LIGATION      Family History:  Family History  Problem Relation Age of Onset  . Anxiety disorder Mother   . Cancer - Lung Mother   . Depression Sister   . Cancer Sister   . Depression Brother   . Cancer Sister   . Cancer Sister   . Cancer Sister   . Heart Problems Sister   . Bipolar disorder Sister   . Diabetes Sister   . Cancer - Lung Sister   . Hypertension Sister   . Diabetes Sister   . Hyperlipidemia Sister   . COPD Sister   . Heart Problems Brother   . Cancer Brother   . Cancer Brother   . Cancer Brother   . Breast cancer Maternal Aunt    Social History:  Social History   Socioeconomic History  . Marital status: Married    Spouse name: Not on file  . Number of children: Not on file  . Years of education: Not on file  . Highest education level: Not on file  Occupational History  . Not on file  Social Needs  . Financial resource strain: Not on file  . Food insecurity:    Worry: Not on file    Inability: Not on file  . Transportation needs:    Medical: Not on  file    Non-medical: Not on file  Tobacco Use  . Smoking status: Current Every Day Smoker    Packs/day: 1.00    Years: 40.00    Pack years: 40.00    Types: Cigarettes    Start date: 05/05/1975  . Smokeless tobacco: Never Used  Substance and Sexual Activity  . Alcohol use: No    Alcohol/week: 0.0 standard drinks    Comment: socially  . Drug use: No  . Sexual activity: Never  Lifestyle  . Physical activity:    Days per week: Not on file    Minutes per session: Not on file  . Stress: Not on file  Relationships  . Social connections:    Talks on phone: Not on file    Gets together: Not on file    Attends religious service: Not on file    Active member of club or organization: Not on file    Attends meetings of clubs or organizations: Not on file    Relationship status: Not on file  Other Topics Concern  . Not on file  Social History Narrative  . Not on file   Additional History:   She currently  lives with her husband. She reported that they live on the same property as her son and his family  Assessment:   Musculoskeletal: Strength & Muscle Tone: within normal limits Gait & Station: normal Patient leans: N/A  Psychiatric Specialty Exam: Medication Refill  Associated symptoms include coughing and myalgias.  Insomnia  PMH includes: no depression.    Review of Systems  Respiratory: Positive for cough, shortness of breath and wheezing.   Musculoskeletal: Positive for back pain and myalgias.  Psychiatric/Behavioral: Negative for depression. The patient is nervous/anxious and has insomnia.   All other systems reviewed and are negative.   Blood pressure 120/66, pulse 97, temperature 98.7 F (37.1 C), temperature source Oral, weight 187 lb (84.8 kg), SpO2 92 %.Body mass index is 33.13 kg/m.  General Appearance: Casual  Eye Contact:  Fair  Speech:  Clear and Coherent  Volume:  Normal  Mood:  Euthymic  Affect:  Congruent  Thought Process:  Coherent  Orientation:  Full (Time, Place, and Person)  Thought Content:  WDL  Suicidal Thoughts:  No  Homicidal Thoughts:  No  Memory:  Immediate;   Fair  Judgement:  Fair  Insight:  Fair  Psychomotor Activity:  Normal  Concentration:  Fair  Recall:  [employer] of Knowledge: Fair  Language: Fair  Akathisia:  No  Handed:  Right  AIMS (if indicated):    Assets:  Communication Skills Housing Physical Health Social Support  ADL's:  Intact  Cognition: WNL  Sleep:  8-9    Is the patient at risk to self?  No. Has the patient been a risk to self in the past 6 months?  No. Has the patient been a risk to self within the distant past?  No. Is the patient a risk to others?  No. Has the patient been a risk to others in the past 6 months?  No. Has the patient been a risk to others within the distant past?  No.  Current Medications: Current Outpatient Medications  Medication Sig Dispense Refill  . ACCU-CHEK FASTCLIX LANCETS MISC      . ACCU-CHEK SMARTVIEW test strip     . albuterol (PROVENTIL HFA) 108 (90 BASE) MCG/ACT inhaler Inhale into the lungs.    Marland Kitchen albuterol (PROVENTIL) (2.5 MG/3ML) 0.083% nebulizer solution Take 2.5 mg  by nebulization every 6 (six) hours as needed for wheezing or shortness of breath.    Marland Kitchen amLODipine (NORVASC) 10 MG tablet Take 1 tablet by mouth daily.    Marland Kitchen aspirin 81 MG chewable tablet Chew 1 tablet by mouth daily.    Marland Kitchen esomeprazole (NEXIUM) 40 MG capsule Take 40 mg by mouth daily.    . Fluticasone-Umeclidin-Vilant 100-62.5-25 MCG/INH AEPB Inhale into the lungs.    . gabapentin (NEURONTIN) 100 MG capsule QAM, QNOON 60 capsule 2  . gabapentin (NEURONTIN) 400 MG capsule Take 1 capsule (400 mg total) by mouth at bedtime. 30 capsule 2  . ipratropium (ATROVENT) 0.02 % nebulizer solution Take 2.5 mLs (0.5 mg total) by nebulization every 6 (six) hours as needed for wheezing or shortness of breath. 75 mL 12  . metFORMIN (GLUCOPHAGE) 500 MG tablet Take 1 tablet by mouth every morning.    . methylPREDNISolone (MEDROL DOSEPAK) 4 MG TBPK tablet Follow package directions.    . metoprolol tartrate (LOPRESSOR) 25 MG tablet Take 1 tablet by mouth 2 (two) times daily.    . Multiple Vitamin (MULTIVITAMIN) tablet Take 1 tablet by mouth daily.    . potassium chloride (KLOR-CON) 8 MEQ tablet Take 1 tablet by mouth daily.    . pravastatin (PRAVACHOL) 80 MG tablet Take 1 tablet by mouth every evening.    Marland Kitchen rOPINIRole (REQUIP) 0.25 MG tablet Take 1 tablet every evening for 2 days, then increase to 2 tablets every evening.    Marland Kitchen SPIRIVA HANDIHALER 18 MCG inhalation capsule     . spironolactone (ALDACTONE) 50 MG tablet Take 1 tablet by mouth daily.    Marland Kitchen venlafaxine XR (EFFEXOR-XR) 150 MG 24 hr capsule Take 1 capsule (150 mg total) by mouth daily with breakfast. 90 capsule 1   No current facility-administered medications for this visit.     Medical Decision Making:  Established Problem, Stable/Improving (1), Review of  Psycho-Social Stressors (1) and Review of Last Therapy Session (1)  Treatment Plan Summary:Medication management   Continue medications as followsedications as follows Continue with venlafaxine 150 mg in the morning  Neurontin 100 mg twice daily and 400 mg as daily on the days when she has increase in her restless night.  She will continue 400 mg daily on other days. She agreed with the plan. Follow-up in 2  months or earlier  This note was generated in part or whole with voice recognition software. Voice regonition is usually quite accurate but there are transcription errors that can and very often do occur. I apologize for any typographical errors that were not detected and corrected.   Rainey Pines, MD    12/01/2018, 10:13 AM

## 2018-12-03 ENCOUNTER — Other Ambulatory Visit (HOSPITAL_COMMUNITY): Payer: Self-pay | Admitting: Specialist

## 2018-12-03 ENCOUNTER — Other Ambulatory Visit: Payer: Self-pay | Admitting: Specialist

## 2018-12-03 DIAGNOSIS — J984 Other disorders of lung: Secondary | ICD-10-CM

## 2018-12-03 DIAGNOSIS — R0602 Shortness of breath: Secondary | ICD-10-CM

## 2018-12-03 DIAGNOSIS — Z9981 Dependence on supplemental oxygen: Secondary | ICD-10-CM

## 2018-12-03 DIAGNOSIS — R911 Solitary pulmonary nodule: Secondary | ICD-10-CM

## 2018-12-10 ENCOUNTER — Ambulatory Visit: Payer: Medicare HMO

## 2018-12-11 DIAGNOSIS — J449 Chronic obstructive pulmonary disease, unspecified: Secondary | ICD-10-CM | POA: Diagnosis not present

## 2018-12-12 ENCOUNTER — Ambulatory Visit: Payer: Medicare HMO

## 2018-12-14 DIAGNOSIS — C801 Malignant (primary) neoplasm, unspecified: Secondary | ICD-10-CM

## 2018-12-14 HISTORY — DX: Malignant (primary) neoplasm, unspecified: C80.1

## 2018-12-17 ENCOUNTER — Other Ambulatory Visit: Payer: Self-pay

## 2018-12-17 ENCOUNTER — Ambulatory Visit
Admission: RE | Admit: 2018-12-17 | Discharge: 2018-12-17 | Disposition: A | Discharge status: Home / Self Care | Payer: Medicare HMO | Source: Ambulatory Visit | Attending: Specialist | Admitting: Specialist

## 2018-12-17 DIAGNOSIS — Z9981 Dependence on supplemental oxygen: Secondary | ICD-10-CM

## 2018-12-17 DIAGNOSIS — J439 Emphysema, unspecified: Secondary | ICD-10-CM | POA: Diagnosis not present

## 2018-12-17 DIAGNOSIS — J984 Other disorders of lung: Secondary | ICD-10-CM

## 2018-12-17 DIAGNOSIS — R911 Solitary pulmonary nodule: Secondary | ICD-10-CM

## 2018-12-17 DIAGNOSIS — R0602 Shortness of breath: Secondary | ICD-10-CM

## 2018-12-19 DIAGNOSIS — J449 Chronic obstructive pulmonary disease, unspecified: Secondary | ICD-10-CM | POA: Diagnosis not present

## 2018-12-23 ENCOUNTER — Encounter: Payer: Self-pay | Admitting: Pulmonary Disease

## 2018-12-23 DIAGNOSIS — J449 Chronic obstructive pulmonary disease, unspecified: Secondary | ICD-10-CM | POA: Diagnosis not present

## 2018-12-23 DIAGNOSIS — R590 Localized enlarged lymph nodes: Secondary | ICD-10-CM | POA: Diagnosis not present

## 2018-12-23 DIAGNOSIS — R918 Other nonspecific abnormal finding of lung field: Secondary | ICD-10-CM | POA: Diagnosis not present

## 2019-01-01 ENCOUNTER — Other Ambulatory Visit: Payer: Self-pay

## 2019-01-01 ENCOUNTER — Encounter
Admission: RE | Admit: 2019-01-01 | Discharge: 2019-01-01 | Disposition: A | Discharge status: Home / Self Care | Payer: Medicare HMO | Source: Ambulatory Visit | Attending: Pulmonary Disease | Admitting: Pulmonary Disease

## 2019-01-01 ENCOUNTER — Observation Stay
Admission: EM | Admit: 2019-01-01 | Discharge: 2019-01-02 | Disposition: A | Discharge status: Home / Self Care | Payer: Medicare HMO | Attending: Internal Medicine | Admitting: Internal Medicine

## 2019-01-01 ENCOUNTER — Encounter: Payer: Self-pay | Admitting: Emergency Medicine

## 2019-01-01 DIAGNOSIS — E785 Hyperlipidemia, unspecified: Secondary | ICD-10-CM | POA: Insufficient documentation

## 2019-01-01 DIAGNOSIS — Z79899 Other long term (current) drug therapy: Secondary | ICD-10-CM | POA: Insufficient documentation

## 2019-01-01 DIAGNOSIS — K649 Unspecified hemorrhoids: Secondary | ICD-10-CM | POA: Diagnosis not present

## 2019-01-01 DIAGNOSIS — Z8249 Family history of ischemic heart disease and other diseases of the circulatory system: Secondary | ICD-10-CM | POA: Insufficient documentation

## 2019-01-01 DIAGNOSIS — Z01818 Encounter for other preprocedural examination: Secondary | ICD-10-CM

## 2019-01-01 DIAGNOSIS — Z9981 Dependence on supplemental oxygen: Secondary | ICD-10-CM | POA: Diagnosis not present

## 2019-01-01 DIAGNOSIS — F329 Major depressive disorder, single episode, unspecified: Secondary | ICD-10-CM | POA: Insufficient documentation

## 2019-01-01 DIAGNOSIS — I10 Essential (primary) hypertension: Secondary | ICD-10-CM | POA: Insufficient documentation

## 2019-01-01 DIAGNOSIS — J449 Chronic obstructive pulmonary disease, unspecified: Secondary | ICD-10-CM | POA: Diagnosis not present

## 2019-01-01 DIAGNOSIS — J9610 Chronic respiratory failure, unspecified whether with hypoxia or hypercapnia: Secondary | ICD-10-CM | POA: Diagnosis not present

## 2019-01-01 DIAGNOSIS — Z886 Allergy status to analgesic agent status: Secondary | ICD-10-CM | POA: Insufficient documentation

## 2019-01-01 DIAGNOSIS — D649 Anemia, unspecified: Secondary | ICD-10-CM | POA: Diagnosis not present

## 2019-01-01 DIAGNOSIS — Z7984 Long term (current) use of oral hypoglycemic drugs: Secondary | ICD-10-CM | POA: Diagnosis not present

## 2019-01-01 DIAGNOSIS — Z818 Family history of other mental and behavioral disorders: Secondary | ICD-10-CM | POA: Insufficient documentation

## 2019-01-01 DIAGNOSIS — F419 Anxiety disorder, unspecified: Secondary | ICD-10-CM | POA: Insufficient documentation

## 2019-01-01 DIAGNOSIS — Z888 Allergy status to other drugs, medicaments and biological substances status: Secondary | ICD-10-CM | POA: Insufficient documentation

## 2019-01-01 DIAGNOSIS — Z7982 Long term (current) use of aspirin: Secondary | ICD-10-CM | POA: Diagnosis not present

## 2019-01-01 DIAGNOSIS — K219 Gastro-esophageal reflux disease without esophagitis: Secondary | ICD-10-CM | POA: Insufficient documentation

## 2019-01-01 DIAGNOSIS — R918 Other nonspecific abnormal finding of lung field: Secondary | ICD-10-CM | POA: Diagnosis not present

## 2019-01-01 DIAGNOSIS — K922 Gastrointestinal hemorrhage, unspecified: Secondary | ICD-10-CM | POA: Diagnosis not present

## 2019-01-01 DIAGNOSIS — Z833 Family history of diabetes mellitus: Secondary | ICD-10-CM | POA: Diagnosis not present

## 2019-01-01 DIAGNOSIS — I2510 Atherosclerotic heart disease of native coronary artery without angina pectoris: Secondary | ICD-10-CM | POA: Diagnosis not present

## 2019-01-01 DIAGNOSIS — E1140 Type 2 diabetes mellitus with diabetic neuropathy, unspecified: Secondary | ICD-10-CM | POA: Diagnosis not present

## 2019-01-01 DIAGNOSIS — G2581 Restless legs syndrome: Secondary | ICD-10-CM | POA: Insufficient documentation

## 2019-01-01 DIAGNOSIS — Z836 Family history of other diseases of the respiratory system: Secondary | ICD-10-CM | POA: Diagnosis not present

## 2019-01-01 DIAGNOSIS — E119 Type 2 diabetes mellitus without complications: Secondary | ICD-10-CM | POA: Diagnosis not present

## 2019-01-01 DIAGNOSIS — Z885 Allergy status to narcotic agent status: Secondary | ICD-10-CM | POA: Insufficient documentation

## 2019-01-01 DIAGNOSIS — F17210 Nicotine dependence, cigarettes, uncomplicated: Secondary | ICD-10-CM | POA: Diagnosis not present

## 2019-01-01 HISTORY — DX: Gastro-esophageal reflux disease without esophagitis: K21.9

## 2019-01-01 HISTORY — DX: Malignant (primary) neoplasm, unspecified: C80.1

## 2019-01-01 HISTORY — DX: Restless legs syndrome: G25.81

## 2019-01-01 HISTORY — DX: Polyneuropathy, unspecified: G62.9

## 2019-01-01 HISTORY — DX: Atherosclerotic heart disease of native coronary artery without angina pectoris: I25.10

## 2019-01-01 LAB — COMPREHENSIVE METABOLIC PANEL
ALBUMIN: 3.9 g/dL (ref 3.5–5.0)
ALT: 13 U/L (ref 0–44)
AST: 21 U/L (ref 15–41)
Alkaline Phosphatase: 71 U/L (ref 38–126)
Anion gap: 9 (ref 5–15)
BUN: 19 mg/dL (ref 8–23)
CHLORIDE: 101 mmol/L (ref 98–111)
CO2: 28 mmol/L (ref 22–32)
Calcium: 9.2 mg/dL (ref 8.9–10.3)
Creatinine, Ser: 0.78 mg/dL (ref 0.44–1.00)
GFR calc Af Amer: >60 mL/min (ref >60)
GFR calc non Af Amer: >60 mL/min (ref >60)
GLUCOSE: 141 mg/dL — AB (ref 70–99)
Potassium: 4.3 mmol/L (ref 3.5–5.1)
Sodium: 138 mmol/L (ref 135–145)
Total Bilirubin: 0.3 mg/dL (ref 0.3–1.2)
Total Protein: 7 g/dL (ref 6.5–8.1)

## 2019-01-01 LAB — CBC
HCT: 21.9 % — ABNORMAL LOW (ref 36.0–46.0)
HCT: 22.3 % — ABNORMAL LOW (ref 36.0–46.0)
Hemoglobin: 5.8 g/dL — ABNORMAL LOW (ref 12.0–15.0)
Hemoglobin: 6 g/dL — ABNORMAL LOW (ref 12.0–15.0)
MCH: 18.6 pg — ABNORMAL LOW (ref 26.0–34.0)
MCH: 18.8 pg — AB (ref 26.0–34.0)
MCHC: 26.5 g/dL — ABNORMAL LOW (ref 30.0–36.0)
MCHC: 26.9 g/dL — ABNORMAL LOW (ref 30.0–36.0)
MCV: 70.4 fL — ABNORMAL LOW (ref 80.0–100.0)
MCV: 69.9 fL — AB (ref 80.0–100.0)
PLATELETS: 414 10*3/uL — AB (ref 150–400)
Platelets: 430 10*3/uL — ABNORMAL HIGH (ref 150–400)
RBC: 3.11 MIL/uL — ABNORMAL LOW (ref 3.87–5.11)
RBC: 3.19 MIL/uL — ABNORMAL LOW (ref 3.87–5.11)
RDW: 20 % — ABNORMAL HIGH (ref 11.5–15.5)
RDW: 20 % — ABNORMAL HIGH (ref 11.5–15.5)
WBC: 8.4 10*3/uL (ref 4.0–10.5)
WBC: 8.6 10*3/uL (ref 4.0–10.5)
nRBC: 0.5 % — ABNORMAL HIGH (ref 0.0–0.2)
nRBC: 0.7 % — ABNORMAL HIGH (ref 0.0–0.2)

## 2019-01-01 LAB — APTT: aPTT: 26 seconds (ref 24–36)

## 2019-01-01 LAB — IRON AND TIBC
Iron: 15 ug/dL — ABNORMAL LOW (ref 28–170)
Saturation Ratios: 3 % — ABNORMAL LOW (ref 10.4–31.8)
TIBC: 509 ug/dL — ABNORMAL HIGH (ref 250–450)
UIBC: 494 ug/dL

## 2019-01-01 LAB — ABO/RH: ABO/RH(D): O POS

## 2019-01-01 LAB — FERRITIN: Ferritin: 5 ng/mL — ABNORMAL LOW (ref 11–307)

## 2019-01-01 LAB — GLUCOSE, CAPILLARY: Glucose-Capillary: 94 mg/dL (ref 70–99)

## 2019-01-01 LAB — PREPARE RBC (CROSSMATCH)

## 2019-01-01 LAB — PROTIME-INR
INR: 1 (ref 0.8–1.2)
Prothrombin Time: 12.6 seconds (ref 11.4–15.2)

## 2019-01-01 MED ORDER — VENLAFAXINE HCL ER 150 MG PO CP24
150.0000 mg | ORAL_CAPSULE | Freq: Every day | ORAL | Status: DC
  Administered 2019-01-02: 150 mg via ORAL
  Filled 2019-01-01: qty 2
  Filled 2019-01-01: qty 1

## 2019-01-01 MED ORDER — INSULIN ASPART 100 UNIT/ML ~~LOC~~ SOLN: 0.0000 [IU] | Freq: Every day | SUBCUTANEOUS | Status: DC

## 2019-01-01 MED ORDER — FLUTICASONE FUROATE-VILANTEROL 100-25 MCG/INH IN AEPB
2.0000 | INHALATION_SPRAY | Freq: Every day | RESPIRATORY_TRACT | Status: DC
  Administered 2019-01-02: 08:00:00 2 via RESPIRATORY_TRACT
  Filled 2019-01-01: qty 28

## 2019-01-01 MED ORDER — PRAVASTATIN SODIUM 40 MG PO TABS
80.0000 mg | ORAL_TABLET | Freq: Every evening | ORAL | Status: DC
  Administered 2019-01-01: 80 mg via ORAL
  Filled 2019-01-01 (×2): qty 2

## 2019-01-01 MED ORDER — FLUTICASONE-UMECLIDIN-VILANT 100-62.5-25 MCG/INH IN AEPB: 2.0000 | INHALATION_SPRAY | Freq: Every day | RESPIRATORY_TRACT | Status: DC

## 2019-01-01 MED ORDER — GABAPENTIN 300 MG PO CAPS
400.0000 mg | ORAL_CAPSULE | Freq: Two times a day (BID) | ORAL | Status: DC
  Administered 2019-01-02: 400 mg via ORAL
  Filled 2019-01-01: qty 1

## 2019-01-01 MED ORDER — ROPINIROLE HCL 0.25 MG PO TABS
0.5000 mg | ORAL_TABLET | Freq: Every day | ORAL | Status: DC
  Administered 2019-01-01: 0.5 mg via ORAL
  Filled 2019-01-01 (×2): qty 2

## 2019-01-01 MED ORDER — ACETAMINOPHEN 325 MG PO TABS: 650.0000 mg | ORAL_TABLET | Freq: Four times a day (QID) | ORAL | Status: DC | PRN

## 2019-01-01 MED ORDER — GABAPENTIN 100 MG PO CAPS
100.0000 mg | ORAL_CAPSULE | Freq: Two times a day (BID) | ORAL | Status: DC
  Administered 2019-01-02 (×2): 100 mg via ORAL
  Filled 2019-01-01 (×2): qty 1

## 2019-01-01 MED ORDER — ALBUTEROL SULFATE (2.5 MG/3ML) 0.083% IN NEBU: 2.5000 mg | INHALATION_SOLUTION | Freq: Four times a day (QID) | RESPIRATORY_TRACT | Status: DC | PRN

## 2019-01-01 MED ORDER — SPIRONOLACTONE 25 MG PO TABS
50.0000 mg | ORAL_TABLET | Freq: Every day | ORAL | Status: DC
  Administered 2019-01-02: 50 mg via ORAL
  Filled 2019-01-01: qty 2

## 2019-01-01 MED ORDER — ONDANSETRON HCL 4 MG PO TABS: 4.0000 mg | ORAL_TABLET | Freq: Four times a day (QID) | ORAL | Status: DC | PRN

## 2019-01-01 MED ORDER — PANTOPRAZOLE SODIUM 40 MG PO TBEC
40.0000 mg | DELAYED_RELEASE_TABLET | Freq: Every day | ORAL | Status: DC
  Administered 2019-01-02: 10:00:00 40 mg via ORAL
  Filled 2019-01-01: qty 1

## 2019-01-01 MED ORDER — ALBUTEROL SULFATE HFA 108 (90 BASE) MCG/ACT IN AERS: 2.0000 | INHALATION_SPRAY | RESPIRATORY_TRACT | Status: DC | PRN

## 2019-01-01 MED ORDER — ACETAMINOPHEN 650 MG RE SUPP: 650.0000 mg | Freq: Four times a day (QID) | RECTAL | Status: DC | PRN

## 2019-01-01 MED ORDER — POTASSIUM CHLORIDE ER 8 MEQ PO TBCR
8.0000 meq | EXTENDED_RELEASE_TABLET | Freq: Every day | ORAL | Status: DC
  Administered 2019-01-02: 10:00:00 8 meq via ORAL
  Filled 2019-01-01: qty 1

## 2019-01-01 MED ORDER — METOPROLOL TARTRATE 25 MG PO TABS
25.0000 mg | ORAL_TABLET | Freq: Two times a day (BID) | ORAL | Status: DC
  Administered 2019-01-02: 10:00:00 25 mg via ORAL
  Filled 2019-01-01: qty 1

## 2019-01-01 MED ORDER — AMLODIPINE BESYLATE 5 MG PO TABS
10.0000 mg | ORAL_TABLET | Freq: Every day | ORAL | Status: DC
  Administered 2019-01-02: 10 mg via ORAL
  Filled 2019-01-01: qty 2

## 2019-01-01 MED ORDER — INSULIN ASPART 100 UNIT/ML ~~LOC~~ SOLN
0.0000 [IU] | Freq: Three times a day (TID) | SUBCUTANEOUS | Status: DC
  Administered 2019-01-02: 13:00:00 1 [IU] via SUBCUTANEOUS
  Filled 2019-01-01: qty 1

## 2019-01-01 MED ORDER — ONDANSETRON HCL 4 MG/2ML IJ SOLN: 4.0000 mg | Freq: Four times a day (QID) | INTRAMUSCULAR | Status: DC | PRN

## 2019-01-01 MED ORDER — FUROSEMIDE 10 MG/ML IJ SOLN
40.0000 mg | Freq: Once | INTRAMUSCULAR | Status: AC
  Administered 2019-01-02: 40 mg via INTRAVENOUS
  Filled 2019-01-01: qty 4

## 2019-01-01 MED ORDER — METFORMIN HCL 500 MG PO TABS
500.0000 mg | ORAL_TABLET | Freq: Every day | ORAL | Status: DC
  Administered 2019-01-02: 08:00:00 500 mg via ORAL
  Filled 2019-01-01: qty 1

## 2019-01-01 MED ORDER — UMECLIDINIUM BROMIDE 62.5 MCG/INH IN AEPB
2.0000 | INHALATION_SPRAY | Freq: Every day | RESPIRATORY_TRACT | Status: DC
  Administered 2019-01-02: 2 via RESPIRATORY_TRACT
  Filled 2019-01-01: qty 7

## 2019-01-01 MED ORDER — SODIUM CHLORIDE 0.9 % IV SOLN
10.0000 mL/h | Freq: Once | INTRAVENOUS | Status: AC
  Administered 2019-01-02: 10 mL/h via INTRAVENOUS

## 2019-01-01 MED ORDER — ADULT MULTIVITAMIN W/MINERALS CH
1.0000 | ORAL_TABLET | Freq: Every day | ORAL | Status: DC
  Filled 2019-01-01: qty 1

## 2019-01-01 MED ORDER — IPRATROPIUM BROMIDE 0.02 % IN SOLN: 0.5000 mg | Freq: Four times a day (QID) | RESPIRATORY_TRACT | Status: DC | PRN

## 2019-01-01 NOTE — ED Notes (Signed)
As per Pt got a call regarding the abnormal blood work result. Pt denies any pain at this moment. Pt in no acute distress at this time.

## 2019-01-01 NOTE — ED Triage Notes (Signed)
PT was called from preop for abnormal lab work. PT has hg of 5.8. PT c/o increased SOB, pt wears 2L chronic for COPD. PT appears pale. A&OX4. Denies any dark stool or bleeding

## 2019-01-01 NOTE — H&P (Signed)
[facility] NAME: Suzanne Glenn    MR#:  [id number]  DATE OF BIRTH:  10-Dec-1950  DATE OF ADMISSION:  01/01/2019  PRIMARY CARE PHYSICIAN: Glendon Axe, MD   REQUESTING/REFERRING PHYSICIAN: Dr Harvest Dark  CHIEF COMPLAINT:   Chief Complaint  Patient presents with  . Abnormal Lab    HISTORY OF PRESENT ILLNESS:  Suzanne Glenn  is a 68 y.o. female was over for preoperative testing this morning and had some labs and an EKG drawn.  She was called by her primary care physician to come to the hospital for blood transfusion.  Right now she states she feels okay.  She has been feeling funny over the last 3 days.  She has been tired quite a bit and tired with standing and moving around.  Always has a little short of breath.  PAST MEDICAL HISTORY:   Past Medical History:  Diagnosis Date  . Anxiety   . Asthma   . Cancer ([place]) 12/2018   w/u for right upper lobe mass/cancer  . COPD (chronic obstructive pulmonary disease) (HCC)    also, emphysema. now using o2 via np 24 hours a day  . Coronary artery disease   . Depression   . Diabetes mellitus, type II ([place])   . GERD (gastroesophageal reflux disease)   . Headache    migraines in early 20's  . Hypertension   . Neuropathy   . Restless leg     PAST SURGICAL HISTORY:   Past Surgical History:  Procedure Laterality Date  . BACK SURGERY  2005   surgery x 2, disc fused in neck, pinched nerve in center of back and neck  . CARDIAC CATHETERIZATION  2015   1 stent placed for blockage  . cervical bone infusion    . CHOLECYSTECTOMY    . TUBAL LIGATION      SOCIAL HISTORY:   Social History   Tobacco Use  . Smoking status: Current Every Day Smoker    Packs/day: 1.00    Years: 40.00    Pack years: 40.00    Types: Cigarettes    Start date: 05/05/1975  . Smokeless tobacco: Never Used  Substance Use Topics  . Alcohol use: No    Alcohol/week: 0.0 standard drinks     Comment: socially    FAMILY HISTORY:   Family History  Problem Relation Age of Onset  . Anxiety disorder Mother   . Cancer - Lung Mother   . Depression Sister   . Cancer Sister   . Depression Brother   . Cancer Sister   . Cancer Sister   . Cancer Sister   . Heart Problems Sister   . Bipolar disorder Sister   . Diabetes Sister   . Cancer - Lung Sister   . Hypertension Sister   . Diabetes Sister   . Hyperlipidemia Sister   . COPD Sister   . Heart Problems Brother   . Cancer Brother   . Cancer Brother   . Cancer Brother   . Breast cancer Maternal Aunt     DRUG ALLERGIES:   Allergies  Allergen Reactions  . Diclofenac-Misoprostol Other (See Comments)    Arthrotec - gastritis   . Nsaids Other (See Comments)    gastritis  . Zolpidem Other (See Comments)    Sleep walking   . Hydrocodone-Acetaminophen Nausea And Vomiting  . Simvastatin Other (See Comments)    body aches    REVIEW OF SYSTEMS:  CONSTITUTIONAL:  No fever, chills or sweats.  Positive for fatigue.  EYES: No blurred or double vision.  EARS, NOSE, AND THROAT: No tinnitus or ear pain. No sore throat RESPIRATORY: No cough.  Some shortness of breath.  No wheezing or hemoptysis.  CARDIOVASCULAR: No chest pain, orthopnea, edema.  GASTROINTESTINAL: No nausea, vomiting, diarrhea or abdominal pain.  Sometimes has hemorrhoidal bleeding GENITOURINARY: No dysuria, hematuria.  ENDOCRINE: No polyuria, nocturia,  HEMATOLOGY: No anemia, easy bruising or bleeding SKIN: No rash or lesion. MUSCULOSKELETAL: No joint pain or arthritis.   NEUROLOGIC: No tingling, numbness, weakness.  PSYCHIATRY: No anxiety or depression.   MEDICATIONS AT HOME:   Prior to Admission medications   Medication Sig Start Date End Date Taking? Authorizing Provider  ACCU-CHEK FASTCLIX LANCETS [place]  02/18/15   [provider]  ACCU-CHEK SMARTVIEW test strip  02/18/15   [provider]  albuterol (PROVENTIL HFA) 108 (90 BASE) MCG/ACT  inhaler Inhale 2 puffs into the lungs every 4 (four) hours as needed for wheezing or shortness of breath.  03/22/14   [provider]  albuterol (PROVENTIL) (2.5 MG/3ML) 0.083% nebulizer solution Take 2.5 mg by nebulization every 6 (six) hours as needed for wheezing or shortness of breath.    [provider]  amLODipine (NORVASC) 10 MG tablet Take 10 mg by mouth daily.     [provider]  aspirin 81 MG chewable tablet Chew 1 tablet by mouth daily.    [provider]  esomeprazole (NEXIUM) 40 MG capsule Take 40 mg by mouth daily.    [provider]  Fluticasone-Umeclidin-Vilant 100-62.5-25 MCG/INH AEPB Inhale 2 puffs into the lungs daily. [place] 11/28/18   [provider]  gabapentin (NEURONTIN) 100 MG capsule QAM, QNOON Patient taking differently: Take 100 mg by mouth 2 (two) times daily. Noel Journey 12/01/18   Rainey Pines, MD  gabapentin (NEURONTIN) 400 MG capsule Take 1 capsule (400 mg total) by mouth 2 (two) times daily. 12/01/18   Rainey Pines, MD  ipratropium (ATROVENT) 0.02 % nebulizer solution Take 2.5 mLs (0.5 mg total) by nebulization every 6 (six) hours as needed for wheezing or shortness of breath. 02/26/17   Fritzi Mandes, MD  metFORMIN (GLUCOPHAGE) 500 MG tablet Take 500 mg by mouth daily with breakfast.     [provider]  metoprolol tartrate (LOPRESSOR) 25 MG tablet Take 25 mg by mouth 2 (two) times daily.     [provider]  Multiple Vitamin (MULTIVITAMIN) tablet Take 1 tablet by mouth daily.    [provider]  potassium chloride (KLOR-CON) 8 MEQ tablet Take 8 mEq by mouth daily.     [provider]  pravastatin (PRAVACHOL) 80 MG tablet Take 80 mg by mouth every evening.     [provider]  rOPINIRole (REQUIP) 0.25 MG tablet Take 0.5 mg by mouth at bedtime.  06/30/18   [provider]  spironolactone (ALDACTONE) 50 MG tablet Take 50 mg by mouth daily.     [provider]   venlafaxine XR (EFFEXOR-XR) 150 MG 24 hr capsule Take 1 capsule (150 mg total) by mouth daily with breakfast. 12/01/18   Rainey Pines, MD      VITAL SIGNS:  Blood pressure (!) 145/67, pulse 88, temperature 98.7 F (37.1 C), temperature source Oral, resp. rate 19, SpO2 95 %.  PHYSICAL EXAMINATION:  GENERAL:  68 y.o.-year-old patient lying in the bed with no acute distress.  EYES: Pupils equal, round, reactive to light and accommodation. No scleral icterus.  Extraocular muscles intact.  HEENT: Head atraumatic, normocephalic. Oropharynx and nasopharynx clear.  NECK:  Supple, no jugular venous distention. No thyroid enlargement, no tenderness.  LUNGS: Decreased breath sounds bilaterally, no wheezing, rales,rhonchi or crepitation. No use of accessory muscles of respiration.  CARDIOVASCULAR: S1, S2 normal. No murmurs, rubs, or gallops.  ABDOMEN: Soft, nontender, nondistended. Bowel sounds present. No organomegaly or mass.  EXTREMITIES: No pedal edema, cyanosis, or clubbing.  NEUROLOGIC: Cranial nerves II through XII are intact. Muscle strength 5/5 in all extremities. Sensation intact. Gait not checked.  PSYCHIATRIC: The patient is alert and oriented x 3.  SKIN: No rash, lesion, or ulcer.   LABORATORY PANEL:   CBC Recent Labs  Lab 01/01/19 1618  WBC 8.6  HGB 6.0*  HCT 22.3*  PLT 430*   ------------------------------------------------------------------------------------------------------------------  Chemistries  Recent Labs  Lab 01/01/19 1618  NA 138  K 4.3  CL 101  CO2 28  GLUCOSE 141*  BUN 19  CREATININE 0.78  CALCIUM 9.2  AST 21  ALT 13  ALKPHOS 71  BILITOT 0.3   ------------------------------------------------------------------------------------------------------------------     EKG:   Normal sinus rhythm 74 bpm.  No acute ST-T wave changes  IMPRESSION AND PLAN:   1.  Symptomatic anemia.  Add on iron studies.  Transfuse 1 unit of packed red blood cells.   Recheck hemoglobin tomorrow morning.  If still less than 7 will have to give another unit of blood at that time.  Patient had a colonoscopy and endoscopy in 2012 and told to come back in 10 years.  Patient trace guaiac positive by ER physician.  Patient states that she has hemorrhoids.  Anemia likely due to the lung mass.  Hold aspirin.  Observe overnight. 2.  Lung mass.  Going to have a procedure next week. 3.  Chronic respiratory failure and COPD on 2 L of oxygen chronically.  Continue inhalers 4.  Type 2 diabetes mellitus on metformin and sliding scale 5.  Hypertension continue usual medications 6.  Hyperlipidemia unspecified continue statin.  All the records are reviewed and case discussed with ED provider. Management plans discussed with the patient, family and they are in agreement.  CODE STATUS: Full code  TOTAL TIME TAKING CARE OF THIS PATIENT: 50 minutes.    Loletha Grayer M.D on 01/01/2019 at 7:07 PM  Between 7am to 6pm - Pager - [phone]  After 6pm call admission pager [phone]  [facility] Office  [phone]  CC: Primary care physician; Glendon Axe, MD

## 2019-01-01 NOTE — Patient Instructions (Signed)
INSTRUCTIONS FOR SURGERY     Your surgery is scheduled for: Wednesday, January 07, 2019        To find out your arrival time for the day of surgery,          please call [phone] between 1 pm and 3 pm on : Tuesday, January 06, 2019     When you arrive for surgery, report to the [facility].       Do NOT stop on the first floor to register.    REMEMBER: Instructions that are not followed completely may result in serious medical risk,  up to and including death, or upon the discretion of your surgeon and anesthesiologist,            your surgery may need to be rescheduled.  __X__ 1. Do not eat food after midnight the night before your procedure.                    No gum, candy, lozenger, tic tacs, tums or hard candies.                  ABSOLUTELY NOTHING SOLID IN YOUR MOUTH AFTER MIDNIGHT                    You may drink unlimited clear liquids up to 2 hours before you are scheduled to arrive for surgery.                   Do not drink anything within those 2 hours unless you need to take medicine, then take the                   smallest amount you need.  Clear liquids include:  water, apple juice without pulp,                   any flavor Gatorade, Black coffee, black tea.  Sugar may be added but no dairy/ honey /lemon.                        Broth and jello is not considered a clear liquid.  __x__  2. On the morning of surgery, please brush your teeth with toothpaste and water. You may rinse with                  mouthwash if you wish but DO NOT SWALLOW TOOTHPASTE OR MOUTHWASH  __X___3. NO alcohol for 24 hours before or after surgery.  __x___ 4.  Do NOT smoke or use e-cigarettes for 24 HOURS PRIOR TO SURGERY.                      DO NOT Use any chewable tobacco products for at least 6 hours prior to surgery.  __x___ 5. If you start any new medication after this appointment and prior to surgery, please               Bring it with you on the day of surgery.  ___x__ 6. Notify your doctor if there is any change in your medical condition,  such as fever, infection, vomitting,                   Diarrhea or any open sores.  __x___ 7.  SHOWER as instructed, the night before surgery and the day of surgery.                   Once you have washed with this soap, do NOT use any of the following: Powders, perfumes                    or lotions. Please do not wear make up, hairpins, clips or nail polish. You MAY wear deodorant.                   Men may shave their face and neck.  Women need to shave 48 hours prior to surgery.                   DO NOT wear ANY jewelry on the day of surgery. If there are rings that are too tight to                    remove easily, please address this prior to the surgery day. Piercings need to be removed.                                                                     NO METAL ON YOUR BODY.                    Do NOT bring any valuables.  If you came to Pre-Admit testing then you will not need license,                     insurance card or credit card.  If you will be staying overnight, please either leave your things in                     the car or have your family be responsible for these items.                     [facility] IS NOT RESPONSIBLE FOR BELONGINGS OR VALUABLES.  ___X__ 8. DO NOT wear contact lenses on surgery day.  You may not have dentures,                     Hearing aides, contacts or glasses in the operating room. These items can be                    Placed in the Recovery Room to receive immediately after surgery.  __x___ 9. IF YOU ARE SCHEDULED TO GO HOME ON THE SAME DAY, YOU MUST                   Have someone to drive you home and to stay with you  for the first 24 hours.                    Have an arrangement prior to arriving on surgery day.  ___x__ 10. Take the following medications on the morning of surgery with a  sip of water:                              1. ALBUTEROL INHALER                     2. AMLODIPINE / NORVASC                     3. NEXIUM                     4. GABAPENTIN                     5. METOPROLOL                     6. EFFEXOR  _____ 11.  Follow any instructions provided to you by your surgeon.                        Such as enema, clear liquid bowel prep  __X__  12. STOP ASPIRIN AS OF: TODAY                       THIS INCLUDES BC POWDERS / GOODIES POWDER  __x___ 13. STOP Anti-inflammatories as of: TODAY                      This includes IBUPROFEN / MOTRIN / ADVIL / ALEVE/ NAPROXYN                    YOU MAY TAKE TYLENOL ANY TIME PRIOR TO SURGERY.  __X___ 14.  Stop supplements until after surgery.                     This includes: MULTIVITAMINS                 You may continue taking Vitamin B12 / Vitamin D3 but do not take on the morning of surgery.  _____ 15. Bring your CPAP machine into preop with you on the morning of surgery.  __X____16.  Stop Metformin 2 full days prior to surgery.  Stop on: LAST DOSE January 04, 2019                     TAKE 1/2 OF USUAL INSULIN DOSE ON THE EVENING PRIOR TO SURGERY.                     Do NOT take any diabetes medications on surgery day.  ______17.  Continue to take the following medications but do not take on the morning of surgery:                          SPIRONOLACTONE / POTASSIUM  ______18. If staying overnight, please have appropriate shoes to wear to be able to walk around the unit.                   Wear clean and comfortable clothing to the hospital.  [place].

## 2019-01-01 NOTE — ED Notes (Signed)
ED TO INPATIENT HANDOFF REPORT  ED Nurse Name and Phone #: [phone]  S Name/Age/Gender Suzanne Glenn 68 y.o. female Room/Bed: ED04A/ED04A  Code Status   Code Status: Full Code  Home/SNF/Other Home Patient oriented to: self, place, time and situation Is this baseline? Yes   Triage Complete: Triage complete  Chief Complaint Referred by MD/abnormal labs  Triage Note PT was called from preop for abnormal lab work. PT has hg of 5.8. PT c/o increased SOB, pt wears 2L chronic for COPD. PT appears pale. A&OX4. Denies any dark stool or bleeding    Allergies Allergies  Allergen Reactions  . Diclofenac-Misoprostol Other (See Comments)    Arthrotec - gastritis   . Nsaids Other (See Comments)    gastritis  . Zolpidem Other (See Comments)    Sleep walking   . Hydrocodone-Acetaminophen Nausea And Vomiting  . Simvastatin Other (See Comments)    body aches    Level of Care/Admitting Diagnosis ED Disposition    ED Disposition Condition Comment   Admit  Hospital Area: [facility] [[id number]]  Level of Care: Med-Surg [16]  Diagnosis: Symptomatic anemia [[id number]]  Admitting Physician: Loletha Grayer [[id number]]  Attending Physician: Loletha Grayer [phone]  PT Class (Do Not Modify): Observation [104]  PT Acc Code (Do Not Modify): Observation [10022]       B Medical/Surgery History Past Medical History:  Diagnosis Date  . Anxiety   . Asthma   . Cancer ([place]) 12/2018   w/u for right upper lobe mass/cancer  . COPD (chronic obstructive pulmonary disease) (HCC)    also, emphysema. now using o2 via np 24 hours a day  . Coronary artery disease   . Depression   . Diabetes mellitus, type II ([place])   . GERD (gastroesophageal reflux disease)   . Headache    migraines in early 20's  . Hypertension   . Neuropathy   . Restless leg    Past Surgical History:  Procedure Laterality Date  . BACK SURGERY  2005   surgery x 2, disc fused in neck, pinched nerve in  center of back and neck  . CARDIAC CATHETERIZATION  2015   1 stent placed for blockage  . cervical bone infusion    . CHOLECYSTECTOMY    . TUBAL LIGATION       A IV Location/Drains/Wounds Patient Lines/Drains/Airways Status   Active Line/Drains/Airways    Name:   Placement date:   Placement time:   Site:   Days:   Peripheral IV 02/24/17 Right Wrist   02/24/17    2007    Wrist   676   Peripheral IV 02/24/17 Right Antecubital   02/24/17    2008    Antecubital   676          Intake/Output Last 24 hours No intake or output data in the 24 hours ending 01/01/19 1916  Labs/Imaging Results for orders placed or performed during the hospital encounter of 01/01/19 (from the past 48 hour(s))  ABO/Rh     Status: None   Collection Time: 01/01/19 11:06 AM  Result Value Ref Range   ABO/RH(D)      O POS Performed at [facility], [address] [place], [place] [address]   Comprehensive metabolic panel     Status: Abnormal   Collection Time: 01/01/19  4:18 PM  Result Value Ref Range   Sodium 138 135 - 145 mmol/L   Potassium 4.3 3.5 - 5.1 mmol/L   Chloride 101 98 -  111 mmol/L   CO2 28 22 - 32 mmol/L   Glucose, Bld 141 (H) 70 - 99 mg/dL   BUN 19 8 - 23 mg/dL   Creatinine, Ser 0.78 0.44 - 1.00 mg/dL   Calcium 9.2 8.9 - 10.3 mg/dL   Total Protein 7.0 6.5 - 8.1 g/dL   Albumin 3.9 3.5 - 5.0 g/dL   AST 21 15 - 41 U/L   ALT 13 0 - 44 U/L   Alkaline Phosphatase 71 38 - 126 U/L   Total Bilirubin 0.3 0.3 - 1.2 mg/dL   GFR calc non Af Amer >60 >60 mL/min   GFR calc Af Amer >60 >60 mL/min   Anion gap 9 5 - 15    Comment: Performed at [facility], [address] [place], [place] [address]  CBC     Status: Abnormal   Collection Time: 01/01/19  4:18 PM  Result Value Ref Range   WBC 8.6 4.0 - 10.5 K/uL   RBC 3.19 (L) 3.87 - 5.11 MIL/uL   Hemoglobin 6.0 (L) 12.0 - 15.0 g/dL    Comment: Reticulocyte Hemoglobin testing may be clinically indicated, consider ordering this  additional test [id number]    HCT 22.3 (L) 36.0 - 46.0 %   MCV 69.9 (L) 80.0 - 100.0 fL   MCH 18.8 (L) 26.0 - 34.0 pg   MCHC 26.9 (L) 30.0 - 36.0 g/dL   RDW 20.0 (H) 11.5 - 15.5 %   Platelets 430 (H) 150 - 400 K/uL   nRBC 0.7 (H) 0.0 - 0.2 %    Comment: Performed at [facility], [address] [place], [place] [address]  Type and screen [place]     Status: None (Preliminary result)   Collection Time: 01/01/19  4:18 PM  Result Value Ref Range   ABO/RH(D) O POS    Antibody Screen NEG    Sample Expiration 01/04/2019    Unit Number [id number]    Blood Component Type RED CELLS,LR    Unit division 00    Status of Unit ALLOCATED    Transfusion Status OK TO TRANSFUSE    Crossmatch Result      Compatible Performed at [facility], [address] [place], [place] [address]   Prepare RBC     Status: None (Preliminary result)   Collection Time: 01/01/19  7:00 PM  Result Value Ref Range   Order Confirmation      ORDER PROCESSED BY BLOOD BANK Performed at [facility], [address] [place], [place] [address]    No results found.  Pending Labs Unresulted Labs (From admission, onward)    Start     Ordered   01/02/19 7564  Basic metabolic panel  Tomorrow morning,   STAT     01/01/19 1904   01/02/19 0500  CBC  Tomorrow morning,   STAT     01/01/19 1904   01/01/19 1909  Hemoglobin A1c  Add-on,   AD    Comments:  To assess prior glycemic control    01/01/19 1908   01/01/19 1905  HIV antibody (Routine Testing)  Add-on,   AD     01/01/19 1904   01/01/19 1846  Vitamin B12  Add-on,   AD     01/01/19 1845   01/01/19 1845  Ferritin  Add-on,   AD     01/01/19 1845   01/01/19 1845  Iron and TIBC  Add-on,   AD     01/01/19  1845          Vitals/Pain Today's Vitals   01/01/19 1618 01/01/19 1754 01/01/19 1800 01/01/19 1830  BP:  (!) 148/61 (!) 142/70 (!) 145/67  Pulse:  82 96 88  Resp:  (!) 24 (!) 25 19  Temp: 98.7 F  (37.1 C)     TempSrc: Oral     SpO2:  99% 95% 95%  PainSc:  0-No pain      Isolation Precautions No active isolations  Medications Medications  0.9 %  sodium chloride infusion (has no administration in time range)  acetaminophen (TYLENOL) tablet 650 mg (has no administration in time range)    Or  acetaminophen (TYLENOL) suppository 650 mg (has no administration in time range)  ondansetron (ZOFRAN) tablet 4 mg (has no administration in time range)    Or  ondansetron (ZOFRAN) injection 4 mg (has no administration in time range)  amLODipine (NORVASC) tablet 10 mg (has no administration in time range)  metoprolol tartrate (LOPRESSOR) tablet 25 mg (has no administration in time range)  pravastatin (PRAVACHOL) tablet 80 mg (has no administration in time range)  spironolactone (ALDACTONE) tablet 50 mg (has no administration in time range)  venlafaxine XR (EFFEXOR-XR) 24 hr capsule 150 mg (has no administration in time range)  metFORMIN (GLUCOPHAGE) tablet 500 mg (has no administration in time range)  pantoprazole (PROTONIX) EC tablet 40 mg (has no administration in time range)  gabapentin (NEURONTIN) capsule 100 mg (has no administration in time range)  rOPINIRole (REQUIP) tablet 0.5 mg (has no administration in time range)  multivitamin tablet 1 tablet (has no administration in time range)  potassium chloride (KLOR-CON) CR tablet 8 mEq (has no administration in time range)  albuterol (PROVENTIL HFA;VENTOLIN HFA) 108 (90 Base) MCG/ACT inhaler 2 puff (has no administration in time range)  Fluticasone-Umeclidin-Vilant 100-62.5-25 MCG/INH AEPB 2 puff (has no administration in time range)  ipratropium (ATROVENT) nebulizer solution 0.5 mg (has no administration in time range)  albuterol (PROVENTIL) (2.5 MG/3ML) 0.083% nebulizer solution 2.5 mg (has no administration in time range)  insulin aspart (novoLOG) injection 0-9 Units (has no administration in time range)  insulin aspart (novoLOG)  injection 0-5 Units (has no administration in time range)  furosemide (LASIX) injection 40 mg (has no administration in time range)    Mobility walks with person assist Low fall risk   Focused Assessments Cardiac Assessment Handoff:    Lab Results  Component Value Date   CKTOTAL 49 07/16/2014   CKMB 0.9 07/16/2014   TROPONINI <0.03 02/24/2017   No results found for: DDIMER Does the Patient currently have chest pain? No     R Recommendations: See Admitting Provider Note  Report given to:   Additional Notes: Hgb: 6.0

## 2019-01-01 NOTE — ED Provider Notes (Addendum)
[facility] Emergency Department Provider Note  Time seen: 6:20 PM  I have reviewed the triage vital signs and the nursing notes.   HISTORY  Chief Complaint Abnormal Lab   HPI Suzanne Glenn is a 68 y.o. female with a past medical history of anxiety, possible lung cancer, COPD, CAD, diabetes, gastric reflux, hypertension, presents to the emergency department for abnormal lab work.  According to the patient she was planning to have a lung biopsy performed and had preop blood work.  Was called back today saying that her hemoglobin is very low and she should go to the emergency department for evaluation.  Patient denies any black or bloody stool.  Denies any history of anemia known to her in the past.  Patient states she has been very weak over the last 1 week, states short of breath all the time due to her chronic medical conditions.   Past Medical History:  Diagnosis Date  . Anxiety   . Asthma   . Cancer ([place]) 12/2018   w/u for right upper lobe mass/cancer  . COPD (chronic obstructive pulmonary disease) (HCC)    also, emphysema. now using o2 via np 24 hours a day  . Coronary artery disease   . Depression   . Diabetes mellitus, type II ([place])   . GERD (gastroesophageal reflux disease)   . Headache    migraines in early 20's  . Hypertension   . Neuropathy   . Restless leg     Patient Active Problem List   Diagnosis Date Noted  . Restless legs 06/30/2018  . Pure hypercholesterolemia 09/26/2017  . Sepsis due to pneumonia ([place]) 02/24/2017  . Bilateral carotid artery stenosis 03/30/2016  . Syncope and collapse 03/30/2016  . Essential (primary) hypertension 08/29/2015  . Controlled type 2 diabetes mellitus without complication ([place]) 41/96/2229  . Combined fat and carbohydrate induced hyperlipemia 06/30/2015  . Clinical depression 05/05/2015  . Anxiety, generalized 05/05/2015  . Depression, major, recurrent, moderate ([place]) 05/05/2015  . Benign essential  HTN 04/05/2015  . Diabetes mellitus, type 2 ([place]) 02/09/2015  . Type 2 diabetes mellitus ([place]) 02/09/2015  . Cannot sleep 01/24/2015  . Fibromyalgia 01/24/2015  . CAFL (chronic airflow limitation) ([place]) 01/24/2015  . H/O diabetes mellitus 01/24/2015  . H/O hypercholesterolemia 01/24/2015  . H/O: HTN (hypertension) 01/24/2015  . Coronary artery disease 01/24/2015  . Hypokalemia 01/24/2015  . Arteriosclerosis of coronary artery 10/04/2014  . Chest pain 10/04/2014  . 3-vessel CAD 08/02/2014  . Acute chest pain 06/25/2014  . Breath shortness 06/25/2014  . COPD, moderate ([place]) 06/08/2014  . Moderate COPD (chronic obstructive pulmonary disease) ([place]) 06/08/2014  . Gastro-esophageal reflux disease without esophagitis 05/31/2014    Past Surgical History:  Procedure Laterality Date  . BACK SURGERY  2005   surgery x 2, disc fused in neck, pinched nerve in center of back and neck  . CARDIAC CATHETERIZATION  2015   1 stent placed for blockage  . cervical bone infusion    . CHOLECYSTECTOMY    . TUBAL LIGATION      Prior to Admission medications   Medication Sig Start Date End Date Taking? Authorizing Provider  ACCU-CHEK FASTCLIX LANCETS [place]  02/18/15   [provider]  ACCU-CHEK SMARTVIEW test strip  02/18/15   [provider]  albuterol (PROVENTIL HFA) 108 (90 BASE) MCG/ACT inhaler Inhale 2 puffs into the lungs every 4 (four) hours as needed for wheezing or shortness of breath.  03/22/14   [provider]  albuterol (PROVENTIL) (2.5 MG/3ML) 0.083% nebulizer solution Take 2.5 mg by nebulization every 6 (six) hours as needed for wheezing or shortness of breath.    [provider]  amLODipine (NORVASC) 10 MG tablet Take 10 mg by mouth daily.     [provider]  aspirin 81 MG chewable tablet Chew 1 tablet by mouth daily.    [provider]  esomeprazole (NEXIUM) 40 MG capsule Take 40 mg by mouth daily.    [provider]   Fluticasone-Umeclidin-Vilant 100-62.5-25 MCG/INH AEPB Inhale 2 puffs into the lungs daily. [place] 11/28/18   [provider]  gabapentin (NEURONTIN) 100 MG capsule QAM, QNOON Patient taking differently: Take 100 mg by mouth 2 (two) times daily. Noel Journey 12/01/18   Rainey Pines, MD  gabapentin (NEURONTIN) 400 MG capsule Take 1 capsule (400 mg total) by mouth 2 (two) times daily. 12/01/18   Rainey Pines, MD  ipratropium (ATROVENT) 0.02 % nebulizer solution Take 2.5 mLs (0.5 mg total) by nebulization every 6 (six) hours as needed for wheezing or shortness of breath. 02/26/17   Fritzi Mandes, MD  metFORMIN (GLUCOPHAGE) 500 MG tablet Take 500 mg by mouth daily with breakfast.     [provider]  metoprolol tartrate (LOPRESSOR) 25 MG tablet Take 25 mg by mouth 2 (two) times daily.     [provider]  Multiple Vitamin (MULTIVITAMIN) tablet Take 1 tablet by mouth daily.    [provider]  potassium chloride (KLOR-CON) 8 MEQ tablet Take 8 mEq by mouth daily.     [provider]  pravastatin (PRAVACHOL) 80 MG tablet Take 80 mg by mouth every evening.     [provider]  rOPINIRole (REQUIP) 0.25 MG tablet Take 0.5 mg by mouth at bedtime.  06/30/18   [provider]  spironolactone (ALDACTONE) 50 MG tablet Take 50 mg by mouth daily.     [provider]  venlafaxine XR (EFFEXOR-XR) 150 MG 24 hr capsule Take 1 capsule (150 mg total) by mouth daily with breakfast. 12/01/18   Rainey Pines, MD    Allergies  Allergen Reactions  . Diclofenac-Misoprostol Other (See Comments)    Arthrotec - gastritis   . Nsaids Other (See Comments)    gastritis  . Zolpidem Other (See Comments)    Sleep walking   . Hydrocodone-Acetaminophen Nausea And Vomiting  . Simvastatin Other (See Comments)    body aches    Family History  Problem Relation Age of Onset  . Anxiety disorder Mother   . Cancer - Lung Mother   . Depression Sister   . Cancer Sister    . Depression Brother   . Cancer Sister   . Cancer Sister   . Cancer Sister   . Heart Problems Sister   . Bipolar disorder Sister   . Diabetes Sister   . Cancer - Lung Sister   . Hypertension Sister   . Diabetes Sister   . Hyperlipidemia Sister   . COPD Sister   . Heart Problems Brother   . Cancer Brother   . Cancer Brother   . Cancer Brother   . Breast cancer Maternal Aunt     Social History Social History   Tobacco Use  . Smoking status: Current Every Day Smoker    Packs/day: 1.00    Years: 40.00    Pack years: 40.00    Types: Cigarettes    Start date: 05/05/1975  . Smokeless tobacco: Never Used  Substance Use Topics  .  Alcohol use: No    Alcohol/week: 0.0 standard drinks    Comment: socially  . Drug use: No    Review of Systems Constitutional: Negative for fever Cardiovascular: Negative for chest pain. Respiratory: Negative for shortness of breath. Gastrointestinal: Negative for abdominal pain, vomiting.  Negative for black or bloody stool. Musculoskeletal: Negative for musculoskeletal complaints Skin: Negative for skin complaints  Neurological: Negative for headache All other ROS negative  ____________________________________________   PHYSICAL EXAM:  VITAL SIGNS: ED Triage Vitals  Enc Vitals Group     BP 01/01/19 1616 (!) 151/69     Pulse Rate 01/01/19 1616 (!) 101     Resp 01/01/19 1754 (!) 24     Temp 01/01/19 1618 98.7 F (37.1 C)     Temp Source 01/01/19 1616 Oral     SpO2 01/01/19 1616 96 %     Weight --      Height --      Head Circumference --      Peak Flow --      Pain Score 01/01/19 1617 0     Pain Loc --      Pain Edu? --      Excl. in [place]? --    Constitutional: Alert and oriented. Well appearing and in no distress. Eyes: Normal exam ENT   Head: Normocephalic and atraumatic.   Mouth/Throat: Mucous membranes are moist. Cardiovascular: Normal rate, regular rhythm. No murmur Respiratory: Normal respiratory effort without  tachypnea nor retractions. Breath sounds are clear Gastrointestinal: Soft and nontender. No distention Musculoskeletal: Nontender with normal range of motion in all extremities.  Neurologic:  Normal speech and language. No gross focal neurologic deficits Skin:  Skin is warm, dry and intact.  Psychiatric: Mood and affect are normal.   ____________________________________________   INITIAL IMPRESSION / ASSESSMENT AND PLAN / ED COURSE  Pertinent labs & imaging results that were available during my care of the patient were reviewed by me and considered in my medical decision making (see chart for details).  Patient presents to the emergency department for an abnormal lab.  Recheck of lab work today confirms patient's hemoglobin is 6.0 consistent with anemia as the patient has become weak over the past 1 week and remain short of breath at all times, most consistent with symptomatic anemia.  Rectal examination shows normal colored light brown stool however it is weakly guaiac positive.  I have discussed the pros and cons of blood transfusion, patient has consented verbally for transfusion we will have the patient sign written consent.  Patient will be admitted to the hospital service for further treatment work-up and blood transfusion of 1 unit of PRBCs.  CRITICAL CARE Performed by: Harvest Dark   Total critical care time: 30 minutes  Critical care time was exclusive of separately billable procedures and treating other patients.  Critical care was necessary to treat or prevent imminent or life-threatening deterioration.  Critical care was time spent personally by me on the following activities: development of treatment plan with patient and/or surrogate as well as nursing, discussions with consultants, evaluation of patient's response to treatment, examination of patient, obtaining history from patient or surrogate, ordering and performing treatments and interventions, ordering and review  of laboratory studies, ordering and review of radiographic studies, pulse oximetry and re-evaluation of patient's condition.  ____________________________________________   FINAL CLINICAL IMPRESSION(S) / ED DIAGNOSES  Symptomatic anemia GI bleed   Harvest Dark, MD 01/01/19 Alphonzo Dublin, MD 01/22/19 1659

## 2019-01-01 NOTE — ED Notes (Signed)
Admitting MD at bedside at this time.

## 2019-01-02 DIAGNOSIS — D649 Anemia, unspecified: Secondary | ICD-10-CM | POA: Diagnosis not present

## 2019-01-02 DIAGNOSIS — J9610 Chronic respiratory failure, unspecified whether with hypoxia or hypercapnia: Secondary | ICD-10-CM | POA: Diagnosis not present

## 2019-01-02 DIAGNOSIS — E119 Type 2 diabetes mellitus without complications: Secondary | ICD-10-CM | POA: Diagnosis not present

## 2019-01-02 DIAGNOSIS — R918 Other nonspecific abnormal finding of lung field: Secondary | ICD-10-CM | POA: Diagnosis not present

## 2019-01-02 LAB — CBC
HCT: 28.5 % — ABNORMAL LOW (ref 36.0–46.0)
Hemoglobin: 8 g/dL — ABNORMAL LOW (ref 12.0–15.0)
MCH: 20.5 pg — AB (ref 26.0–34.0)
MCHC: 28.1 g/dL — ABNORMAL LOW (ref 30.0–36.0)
MCV: 72.9 fL — ABNORMAL LOW (ref 80.0–100.0)
Platelets: 423 10*3/uL — ABNORMAL HIGH (ref 150–400)
RBC: 3.91 MIL/uL (ref 3.87–5.11)
RDW: 21.2 % — AB (ref 11.5–15.5)
WBC: 10.1 10*3/uL (ref 4.0–10.5)
nRBC: 0.3 % — ABNORMAL HIGH (ref 0.0–0.2)

## 2019-01-02 LAB — BASIC METABOLIC PANEL
Anion gap: 9 (ref 5–15)
BUN: 16 mg/dL (ref 8–23)
CO2: 32 mmol/L (ref 22–32)
CREATININE: 0.7 mg/dL (ref 0.44–1.00)
Calcium: 9.3 mg/dL (ref 8.9–10.3)
Chloride: 97 mmol/L — ABNORMAL LOW (ref 98–111)
GFR calc Af Amer: >60 mL/min (ref >60)
GFR calc non Af Amer: >60 mL/min (ref >60)
Glucose, Bld: 87 mg/dL (ref 70–99)
Potassium: 3.7 mmol/L (ref 3.5–5.1)
Sodium: 138 mmol/L (ref 135–145)

## 2019-01-02 LAB — HEMOGLOBIN A1C
Hgb A1c MFr Bld: 6.4 % — ABNORMAL HIGH (ref 4.8–5.6)
Mean Plasma Glucose: 136.98 mg/dL

## 2019-01-02 LAB — GLUCOSE, CAPILLARY
Glucose-Capillary: 120 mg/dL — ABNORMAL HIGH (ref 70–99)
Glucose-Capillary: 99 mg/dL (ref 70–99)
Glucose-Capillary: 139 mg/dL — ABNORMAL HIGH (ref 70–99)

## 2019-01-02 LAB — MAGNESIUM: Magnesium: 2.1 mg/dL (ref 1.7–2.4)

## 2019-01-02 LAB — VITAMIN B12: Vitamin B-12: 285 pg/mL (ref 180–914)

## 2019-01-02 MED ORDER — FERROUS SULFATE 325 (65 FE) MG PO TABS: 325.0000 mg | ORAL_TABLET | Freq: Two times a day (BID) | ORAL | Status: DC

## 2019-01-02 MED ORDER — ASPIRIN 81 MG PO CHEW: 81.0000 mg | CHEWABLE_TABLET | Freq: Every day | ORAL | 0 refills | Status: DC

## 2019-01-02 MED ORDER — FERROUS SULFATE 325 (65 FE) MG PO TABS: 325.0000 mg | ORAL_TABLET | Freq: Two times a day (BID) | ORAL | 3 refills | Status: DC

## 2019-01-02 NOTE — TOC Transition Note (Signed)
Transition of Care [facility]) - CM/SW Discharge Note   Patient Details  Name: Suzanne Glenn MRN: [id number] Date of Birth: Aug 12, 1951  Transition of Care [facility]) CM/SW Contact:  Shelbie Ammons, RN Phone Number: 01/02/2019, 2:02 PM   Clinical Narrative:    Discharge to home today per Dr. Anselm Jungling.  No follow-up needs identified    Final next level of care: Home/Self Care Barriers to Discharge: No Barriers Identified   Patient Goals and CMS Choice Patient states their goals for this hospitalization and ongoing recovery are:: (Going home today) CMS Medicare.gov Compare Post Acute Care list provided to:: (NA) Choice offered to / list presented to : NA  Discharge Placement home                       Discharge Plan and Services In-house Referral: NA Discharge Planning Services: CM Consult Post Acute Care Choice: NA          DME Arranged: N/A DME Agency: NA HH Arranged: NA HH Agency: NA   Social Determinants of Health (SDOH) Interventions Lives with husband x 50 years     Readmission Risk Interventions No flowsheet data found.

## 2019-01-02 NOTE — Plan of Care (Signed)
Pt is d/ced home.  Her Hgb improved from 6.0 to 8.0.  Pt had some severe leg cramps extending into her abdomen.  In addition to scheduled meds we tried some mustard which seemed to help.  No further c/o pain.  Pt is going to have a bronch to r/o lung ca.  IV removed and d/c paperwork and f/u appts reviewed.  Pt's husband transported her home.

## 2019-01-02 NOTE — Discharge Summary (Signed)
[facility] [place] NAME: Suzanne Glenn    MR#:  [id number]  DATE OF BIRTH:  03-03-1951  DATE OF ADMISSION:  01/01/2019 ADMITTING PHYSICIAN: Loletha Grayer, MD  DATE OF DISCHARGE: 01/02/2019   PRIMARY CARE PHYSICIAN: Glendon Axe, MD    ADMISSION DIAGNOSIS:  Symptomatic anemia [D64.9] Gastrointestinal hemorrhage, unspecified gastrointestinal hemorrhage type [K92.2]  DISCHARGE DIAGNOSIS:  Active Problems:   Symptomatic anemia   SECONDARY DIAGNOSIS:   Past Medical History:  Diagnosis Date  . Anxiety   . Asthma   . Cancer ([place]) 12/2018   w/u for right upper lobe mass/cancer  . COPD (chronic obstructive pulmonary disease) (HCC)    also, emphysema. now using o2 via np 24 hours a day  . Coronary artery disease   . Depression   . Diabetes mellitus, type II ([place])   . GERD (gastroesophageal reflux disease)   . Headache    migraines in early 20's  . Hypertension   . Neuropathy   . Restless leg     HOSPITAL COURSE:   1.  Symptomatic anemia.  Add on iron studies.  Transfuse 1 unit of packed red blood cells.  Recheck hemoglobin tomorrow morning.  If still less than 7 will have to give another unit of blood at that time.  Patient had a colonoscopy and endoscopy in 2012 and told to come back in 10 years.  Patient trace guaiac positive by ER physician.  Patient states that she has hemorrhoids.  Anemia likely due to the lung mass.  Hold aspirin.  Observe overnight. 2.  Lung mass.  Going to have a procedure next week. 3.  Chronic respiratory failure and COPD on 2 L of oxygen chronically.  Continue inhalers 4.  Type 2 diabetes mellitus on metformin and sliding scale 5.  Hypertension continue usual medications 6.  Hyperlipidemia unspecified continue statin.  After receiving blood transfusion hemoglobin came up to 8 and she was feeling significantly better.  I advised to follow with primary care physician in cancer center in 1 week  and given iron prescription.  DISCHARGE CONDITIONS:   Stable.  CONSULTS OBTAINED:    DRUG ALLERGIES:   Allergies  Allergen Reactions  . Diclofenac-Misoprostol Other (See Comments)    Arthrotec - gastritis   . Nsaids Other (See Comments)    gastritis  . Zolpidem Other (See Comments)    Sleep walking   . Hydrocodone-Acetaminophen Nausea And Vomiting  . Simvastatin Other (See Comments)    body aches    DISCHARGE MEDICATIONS:   Allergies as of 01/02/2019      Reactions   Diclofenac-misoprostol Other (See Comments)   Arthrotec - gastritis    Nsaids Other (See Comments)   gastritis   Zolpidem Other (See Comments)   Sleep walking    Hydrocodone-acetaminophen Nausea And Vomiting   Simvastatin Other (See Comments)   body aches      Medication List    TAKE these medications   Accu-Chek FastClix Lancets Misc   Accu-Chek SmartView test strip Generic drug:  glucose blood   amLODipine 10 MG tablet Commonly known as:  NORVASC Take 10 mg by mouth daily.   aspirin 81 MG chewable tablet Chew 1 tablet (81 mg total) by mouth daily. Continue holding now, as you are doing until procedure. What changed:  additional instructions   esomeprazole 40 MG capsule Commonly known as:  NEXIUM Take 40 mg by mouth daily.   ferrous sulfate 325 (65 FE) MG tablet  Take 1 tablet (325 mg total) by mouth 2 (two) times daily with a meal.   fluticasone 50 MCG/ACT nasal spray Commonly known as:  FLONASE Place 2 sprays into both nostrils daily.   Fluticasone-Umeclidin-Vilant 100-62.5-25 MCG/INH Aepb Inhale 2 puffs into the lungs daily. Treligy   gabapentin 400 MG capsule Commonly known as:  NEURONTIN Take 1 capsule (400 mg total) by mouth 2 (two) times daily. What changed:  Another medication with the same name was changed. Make sure you understand how and when to take each.   gabapentin 100 MG capsule Commonly known as:  NEURONTIN QAM, QNOON What changed:    how much to take  how  to take this  when to take this   ipratropium 0.02 % nebulizer solution Commonly known as:  ATROVENT Take 2.5 mLs (0.5 mg total) by nebulization every 6 (six) hours as needed for wheezing or shortness of breath.   metFORMIN 500 MG tablet Commonly known as:  GLUCOPHAGE Take 500 mg by mouth daily with breakfast.   metoprolol tartrate 25 MG tablet Commonly known as:  LOPRESSOR Take 25 mg by mouth 2 (two) times daily.   multivitamin tablet Take 1 tablet by mouth daily.   potassium chloride 8 MEQ tablet Commonly known as:  KLOR-CON Take 8 mEq by mouth daily.   pravastatin 80 MG tablet Commonly known as:  PRAVACHOL Take 80 mg by mouth every evening.   albuterol (2.5 MG/3ML) 0.083% nebulizer solution Commonly known as:  PROVENTIL Take 2.5 mg by nebulization every 6 (six) hours as needed for wheezing or shortness of breath.   Proventil HFA 108 (90 Base) MCG/ACT inhaler Generic drug:  albuterol Inhale 2 puffs into the lungs every 4 (four) hours as needed for wheezing or shortness of breath.   rOPINIRole 0.25 MG tablet Commonly known as:  REQUIP Take 0.5 mg by mouth at bedtime.   spironolactone 50 MG tablet Commonly known as:  ALDACTONE Take 50 mg by mouth daily.   venlafaxine XR 150 MG 24 hr capsule Commonly known as:  EFFEXOR-XR Take 1 capsule (150 mg total) by mouth daily with breakfast.        DISCHARGE INSTRUCTIONS:    Follow with primary care physician and cancer center in 1 week.  If you experience worsening of your admission symptoms, develop shortness of breath, life threatening emergency, suicidal or homicidal thoughts you must seek medical attention immediately by calling 911 or calling your MD immediately  if symptoms less severe.  You Must read complete instructions/literature along with all the possible adverse reactions/side effects for all the Medicines you take and that have been prescribed to you. Take any new Medicines after you have completely  understood and accept all the possible adverse reactions/side effects.   Please note  You were cared for by a hospitalist during your hospital stay. If you have any questions about your discharge medications or the care you received while you were in the hospital after you are discharged, you can call the unit and asked to speak with the hospitalist on call if the hospitalist that took care of you is not available. Once you are discharged, your primary care physician will handle any further medical issues. Please note that NO REFILLS for any discharge medications will be authorized once you are discharged, as it is imperative that you return to your primary care physician (or establish a relationship with a primary care physician if you do not have one) for your aftercare needs so that they  can reassess your need for medications and monitor your lab values.    Today   CHIEF COMPLAINT:   Chief Complaint  Patient presents with  . Abnormal Lab    HISTORY OF PRESENT ILLNESS:  Muskaan Smet  is a 68 y.o. female was over for preoperative testing this morning and had some labs and an EKG drawn.  She was called by her primary care physician to come to the hospital for blood transfusion.  Right now she states she feels okay.  She has been feeling funny over the last 3 days.  She has been tired quite a bit and tired with standing and moving around.  Always has a little short of breath.   VITAL SIGNS:  Blood pressure 134/67, pulse 88, temperature (!) 97.4 F (36.3 C), temperature source Oral, resp. rate 18, height 5\' 3"  (1.6 m), weight 86.4 kg, SpO2 100 %.  I/O:    Intake/Output Summary (Last 24 hours) at 01/02/2019 1716 Last data filed at 01/02/2019 1401 Gross per 24 hour  Intake 700 ml  Output -  Net 700 ml    PHYSICAL EXAMINATION:  GENERAL:  68 y.o.-year-old patient lying in the bed with no acute distress.  EYES: Pupils equal, round, reactive to light and accommodation. No scleral  icterus. Extraocular muscles intact.  HEENT: Head atraumatic, normocephalic. Oropharynx and nasopharynx clear.  NECK:  Supple, no jugular venous distention. No thyroid enlargement, no tenderness.  LUNGS: Normal breath sounds bilaterally, no wheezing, rales,rhonchi or crepitation. No use of accessory muscles of respiration.  CARDIOVASCULAR: S1, S2 normal. No murmurs, rubs, or gallops.  ABDOMEN: Soft, non-tender, non-distended. Bowel sounds present. No organomegaly or mass.  EXTREMITIES: No pedal edema, cyanosis, or clubbing.  NEUROLOGIC: Cranial nerves II through XII are intact. Muscle strength 5/5 in all extremities. Sensation intact. Gait not checked.  PSYCHIATRIC: The patient is alert and oriented x 3.  SKIN: No obvious rash, lesion, or ulcer.   DATA REVIEW:   CBC Recent Labs  Lab 01/02/19 0514  WBC 10.1  HGB 8.0*  HCT 28.5*  PLT 423*    Chemistries  Recent Labs  Lab 01/01/19 1618 01/02/19 0514  NA 138 138  K 4.3 3.7  CL 101 97*  CO2 28 32  GLUCOSE 141* 87  BUN 19 16  CREATININE 0.78 0.70  CALCIUM 9.2 9.3  MG  --  2.1  AST 21  --   ALT 13  --   ALKPHOS 71  --   BILITOT 0.3  --     Cardiac Enzymes No results for input(s): TROPONINI in the last 168 hours.  Microbiology Results  Results for orders placed or performed during the hospital encounter of 02/24/17  Blood Culture (routine x 2)     Status: None   Collection Time: 02/24/17  8:08 PM  Result Value Ref Range Status   Specimen Description BLOOD BLOOD RIGHT WRIST  Final   Special Requests   Final    BOTTLES DRAWN AEROBIC AND ANAEROBIC Blood Culture results may not be optimal due to an excessive volume of blood received in culture bottles   Culture NO GROWTH 5 DAYS  Final   Report Status 03/01/2017 FINAL  Final  Blood Culture (routine x 2)     Status: None   Collection Time: 02/24/17  8:08 PM  Result Value Ref Range Status   Specimen Description BLOOD RIGHT ANTECUBITAL  Final   Special Requests   Final     BOTTLES DRAWN AEROBIC AND ANAEROBIC  Blood Culture results may not be optimal due to an excessive volume of blood received in culture bottles   Culture NO GROWTH 5 DAYS  Final   Report Status 03/01/2017 FINAL  Final  Urine culture     Status: None   Collection Time: 02/24/17 11:06 PM  Result Value Ref Range Status   Specimen Description URINE, RANDOM  Final   Special Requests NONE  Final   Culture   Final    NO GROWTH Performed at [facility], [place] [address] [place], [place] [address]    Report Status 02/26/2017 FINAL  Final    RADIOLOGY:  No results found.  EKG:   Orders placed or performed during the hospital encounter of 01/01/19  . EKG 12-Lead  . EKG 12-Lead      Management plans discussed with the patient, family and they are in agreement.  CODE STATUS:     Code Status Orders  (From admission, onward)         Start     Ordered   01/01/19 1905  Full code  Continuous     01/01/19 1904        Code Status History    Date Active Date Inactive Code Status Order ID Comments User Context   02/24/2017 2811 02/26/2017 1710 Full Code [id number]  Hugelmeyer, Ubaldo Glassing, DO Inpatient      TOTAL TIME TAKING CARE OF THIS PATIENT: 35 minutes.    Vaughan Basta M.D on 01/02/2019 at 5:16 PM  Between 7am to 6pm - Pager - [phone]  After 6pm go to [email or web address] - password [email or web address] [facility]  Office  [phone]  CC: Primary care physician; Glendon Axe, MD   Note: This dictation was prepared with Dragon dictation along with smaller phrase technology. Any transcriptional errors that result from this process are unintentional.

## 2019-01-02 NOTE — Care Management Obs Status (Signed)
[facility] NOTIFICATION   Patient Details  Name: KRISHIKA BUGGE MRN: [id number] Date of Birth: 01/23/1951   Medicare Observation Status Notification Given:  Yes    Shelbie Ammons, RN 01/02/2019, 9:24 AM

## 2019-01-02 NOTE — TOC Initial Note (Signed)
Transition of Care [facility]) - Initial/Assessment Note    Patient Details  Name: Suzanne Glenn MRN: [id number] Date of Birth: 1951/04/16  [facility] CM/SW Contact:    Shelbie Ammons, RN Phone Number: 01/02/2019, 9:17 AM  Clinical Narrative:  Admitted to [facility] under observation status with the diagnosis of anemia. Lives with husband, Abe People [phone]). Sees Dr. Solon Palm every 3 months. Prescriptions are filled at [facility].  Takes care of all basic and instrumental activities of daily living herself, drives. No home health. No skilled facility. Home oxygen last 8-10 years. Usually wears at night only, but having to wear during the day now. Usually wears 2 liters per nasal cannula.  [employer] provides home oxygen Rolling walker, if needed.  No falls. Gained 25 pounds since January, not sure why. Husband will transport                Expected Discharge Plan: Home/Self Care Barriers to Discharge: No Barriers Identified   Patient Goals and CMS Choice Patient states their goals for this hospitalization and ongoing recovery are:: (Going home today) CMS Medicare.gov Compare Post Acute Care list provided to:: (NA) Choice offered to / list presented to : NA  Expected Discharge Plan and Services Expected Discharge Plan: Home/Self Care In-house Referral: NA Discharge Planning Services: CM Consult Post Acute Care Choice: NA Living arrangements for the past 2 months: Single Family Home Expected Discharge Date: 01/02/19               DME Arranged: N/A DME Agency: NA HH Arranged: NA [place] Agency: NA  Prior Living Arrangements/Services Living arrangements for the past 2 months: Single Family Home Lives with:: Spouse(Billy) Patient language and need for interpreter reviewed:: No Do you feel safe going back to the place where you live?: Yes      Need for Family Participation in Patient Care: No (Comment) Care giver support system in place?: No (comment) Current home  services: (None) Criminal Activity/Legal Involvement Pertinent to Current Situation/Hospitalization: No - Comment as needed  Activities of Daily Living Home Assistive Devices/Equipment: CBG Meter, Nebulizer, Oxygen, Walker (specify type)(Front wheel walker) ADL Screening (condition at time of admission) Patient's cognitive ability adequate to safely complete daily activities?: Yes Is the patient deaf or have difficulty hearing?: No Does the patient have difficulty seeing, even when wearing glasses/contacts?: No Does the patient have difficulty concentrating, remembering, or making decisions?: No Patient able to express need for assistance with ADLs?: Yes Does the patient have difficulty dressing or bathing?: No Independently performs ADLs?: Yes (appropriate for developmental age) Does the patient have difficulty walking or climbing stairs?: No Weakness of Legs: None Weakness of Arms/Hands: None  Permission Sought/Granted Permission sought to share information with : Case Manager Permission granted to share information with : Yes, Verbal Permission Granted              Emotional Assessment Appearance:: Appears stated age Attitude/Demeanor/Rapport: (Calm) Affect (typically observed): Accepting Orientation: : Oriented to Self, Oriented to Place, Oriented to  Time, Oriented to Situation Alcohol / Substance Use: Not Applicable Psych Involvement: No (comment)  Admission diagnosis:  Symptomatic anemia [D64.9] Gastrointestinal hemorrhage, unspecified gastrointestinal hemorrhage type [K92.2] Patient Active Problem List   Diagnosis Date Noted  . Symptomatic anemia 01/01/2019  . Restless legs 06/30/2018  . Pure hypercholesterolemia 09/26/2017  . Sepsis due to pneumonia ([place]) 02/24/2017  . Bilateral carotid artery stenosis 03/30/2016  . Syncope and collapse 03/30/2016  . Essential (primary) hypertension 08/29/2015  .  Controlled type 2 diabetes mellitus without complication ([place])  49/35/5217  . Combined fat and carbohydrate induced hyperlipemia 06/30/2015  . Clinical depression 05/05/2015  . Anxiety, generalized 05/05/2015  . Depression, major, recurrent, moderate ([place]) 05/05/2015  . Benign essential HTN 04/05/2015  . Diabetes mellitus, type 2 ([place]) 02/09/2015  . Type 2 diabetes mellitus ([place]) 02/09/2015  . Cannot sleep 01/24/2015  . Fibromyalgia 01/24/2015  . CAFL (chronic airflow limitation) ([place]) 01/24/2015  . H/O diabetes mellitus 01/24/2015  . H/O hypercholesterolemia 01/24/2015  . H/O: HTN (hypertension) 01/24/2015  . Coronary artery disease 01/24/2015  . Hypokalemia 01/24/2015  . Arteriosclerosis of coronary artery 10/04/2014  . Chest pain 10/04/2014  . 3-vessel CAD 08/02/2014  . Acute chest pain 06/25/2014  . Breath shortness 06/25/2014  . COPD, moderate ([place]) 06/08/2014  . Moderate COPD (chronic obstructive pulmonary disease) ([place]) 06/08/2014  . Gastro-esophageal reflux disease without esophagitis 05/31/2014   PCP:  Glendon Axe, MD Pharmacy:   [place], [place] - [address] [address] Phone: [phone] Fax: [phone]     Social Determinants of Health (SDOH) Interventions    Readmission Risk Interventions No flowsheet data found.

## 2019-01-03 LAB — BPAM RBC
Blood Product Expiration Date: 202003222359
ISSUE DATE / TIME: 202003200036
Unit Type and Rh: 5100

## 2019-01-03 LAB — TYPE AND SCREEN
ABO/RH(D): O POS
Antibody Screen: NEGATIVE
Unit division: 0

## 2019-01-03 LAB — HIV ANTIBODY (ROUTINE TESTING W REFLEX): HIV SCREEN 4TH GENERATION: NONREACTIVE

## 2019-01-07 ENCOUNTER — Ambulatory Visit: Admission: RE | Admit: 2019-01-07 | Payer: Medicare HMO | Source: Home / Self Care

## 2019-01-07 ENCOUNTER — Encounter: Admission: RE | Payer: Self-pay | Source: Home / Self Care

## 2019-01-07 SURGERY — BRONCHOSCOPY, WITH EBUS
Anesthesia: General

## 2019-01-09 DIAGNOSIS — J449 Chronic obstructive pulmonary disease, unspecified: Secondary | ICD-10-CM | POA: Diagnosis not present

## 2019-01-13 DIAGNOSIS — G2581 Restless legs syndrome: Secondary | ICD-10-CM | POA: Diagnosis not present

## 2019-01-13 DIAGNOSIS — J9611 Chronic respiratory failure with hypoxia: Secondary | ICD-10-CM | POA: Diagnosis not present

## 2019-01-13 DIAGNOSIS — Z720 Tobacco use: Secondary | ICD-10-CM | POA: Insufficient documentation

## 2019-01-13 DIAGNOSIS — Z09 Encounter for follow-up examination after completed treatment for conditions other than malignant neoplasm: Secondary | ICD-10-CM | POA: Diagnosis not present

## 2019-01-13 DIAGNOSIS — J449 Chronic obstructive pulmonary disease, unspecified: Secondary | ICD-10-CM | POA: Diagnosis not present

## 2019-01-13 DIAGNOSIS — E1165 Type 2 diabetes mellitus with hyperglycemia: Secondary | ICD-10-CM | POA: Diagnosis not present

## 2019-01-13 DIAGNOSIS — R918 Other nonspecific abnormal finding of lung field: Secondary | ICD-10-CM | POA: Diagnosis not present

## 2019-01-13 DIAGNOSIS — D649 Anemia, unspecified: Secondary | ICD-10-CM | POA: Diagnosis not present

## 2019-01-13 DIAGNOSIS — I10 Essential (primary) hypertension: Secondary | ICD-10-CM | POA: Diagnosis not present

## 2019-01-13 DIAGNOSIS — I2510 Atherosclerotic heart disease of native coronary artery without angina pectoris: Secondary | ICD-10-CM | POA: Diagnosis not present

## 2019-01-19 DIAGNOSIS — J449 Chronic obstructive pulmonary disease, unspecified: Secondary | ICD-10-CM | POA: Diagnosis not present

## 2019-02-02 ENCOUNTER — Ambulatory Visit (INDEPENDENT_AMBULATORY_CARE_PROVIDER_SITE_OTHER): Payer: Medicare HMO | Admitting: Psychiatry

## 2019-02-02 ENCOUNTER — Other Ambulatory Visit: Payer: Self-pay

## 2019-02-02 ENCOUNTER — Encounter: Payer: Self-pay | Admitting: Psychiatry

## 2019-02-02 DIAGNOSIS — F331 Major depressive disorder, recurrent, moderate: Secondary | ICD-10-CM

## 2019-02-02 DIAGNOSIS — F411 Generalized anxiety disorder: Secondary | ICD-10-CM | POA: Diagnosis not present

## 2019-02-02 MED ORDER — VENLAFAXINE HCL ER 150 MG PO CP24: 150.0000 mg | ORAL_CAPSULE | Freq: Every day | ORAL | 1 refills | Status: DC

## 2019-02-02 MED ORDER — GABAPENTIN 100 MG PO CAPS: 100.0000 mg | ORAL_CAPSULE | Freq: Two times a day (BID) | ORAL | 2 refills | Status: DC

## 2019-02-02 MED ORDER — GABAPENTIN 400 MG PO CAPS: 400.0000 mg | ORAL_CAPSULE | Freq: Two times a day (BID) | ORAL | 2 refills | Status: DC

## 2019-02-02 NOTE — Progress Notes (Signed)
Patient ID: Suzanne Glenn, female   DOB: [birth year], 68 y.o.   MRN: [id number]  Patient is a 68 year old female with history of depression and GAD who was followed for her medication. She reported that she has been doing well and spending most of the time at home. She stated that she is also continuing therapy and going for her medical appointments. She was discussing in detail about the side effects of the gabapentin and reported that she was unable to do her labs due to the [place]. Patient reported that gabapentin has been helping her and she is able to sleep well as her restless legs are improving. She does not have any other acute symptoms. She denied having any suicidal or homicidal ideations or plans.   Plan Refilled her medications. Patient will follow up in three months earlier depending on her symptoms.  I discussed the assessment and treatment plan with the patient. The patient was provided an opportunity to ask questions and all were answered. The patient agreed with the plan and demonstrated an understanding of the instructions.   The patient was advised to call back or seek an in-person evaluation if the symptoms worsen or if the condition fails to improve as anticipated.   I provided 10 minutes of non-face-to-face time during this encounter.

## 2019-02-02 NOTE — Progress Notes (Cosign Needed)
TC on  02-02-19@ 9:07 pt medical and surgical hx was reviewed with no changes. Pt allergies reviewed with no changes. Pt medications and pharmacy was reviewed and updated. No vitals taken because this is a phone visit.

## 2019-02-09 DIAGNOSIS — J449 Chronic obstructive pulmonary disease, unspecified: Secondary | ICD-10-CM | POA: Diagnosis not present

## 2019-02-23 DIAGNOSIS — J449 Chronic obstructive pulmonary disease, unspecified: Secondary | ICD-10-CM | POA: Diagnosis not present

## 2019-02-23 DIAGNOSIS — R0600 Dyspnea, unspecified: Secondary | ICD-10-CM | POA: Diagnosis not present

## 2019-02-23 DIAGNOSIS — Z720 Tobacco use: Secondary | ICD-10-CM | POA: Diagnosis not present

## 2019-02-23 DIAGNOSIS — R918 Other nonspecific abnormal finding of lung field: Secondary | ICD-10-CM | POA: Diagnosis not present

## 2019-03-02 ENCOUNTER — Other Ambulatory Visit
Admission: RE | Admit: 2019-03-02 | Discharge: 2019-03-02 | Disposition: A | Discharge status: Home / Self Care | Payer: Medicare HMO | Source: Ambulatory Visit | Attending: Pulmonary Disease | Admitting: Pulmonary Disease

## 2019-03-02 ENCOUNTER — Other Ambulatory Visit: Payer: Self-pay

## 2019-03-02 HISTORY — DX: Cardiac murmur, unspecified: R01.1

## 2019-03-02 NOTE — Patient Instructions (Addendum)
Your procedure is scheduled on: 03/06/2019 Fri Report to Same Day Surgery 2nd floor medical mall [facility] Entrance-take elevator on left to 2nd floor.  Check in with surgery information desk.) To find out your arrival time please call [phone] between 1PM - 3PM on 03/05/2019 Thur  Remember: Instructions that are not followed completely may result in serious medical risk, up to and including death, or upon the discretion of your surgeon and anesthesiologist your surgery may need to be rescheduled.    _x___ 1. Do not eat food after midnight the night before your procedure. You may drink clear liquids up to 2 hours before you are scheduled to arrive at the hospital for your procedure.  Do not drink clear liquids within 2 hours of your scheduled arrival to the hospital.  Clear liquids include  --Water or Apple juice without pulp  --Clear carbohydrate beverage such as ClearFast or Gatorade  --Black Coffee or Clear Tea (No milk, no creamers, do not add anything to                  the coffee or Tea Type 1 and type 2 diabetics should only drink water.   ____Ensure clear carbohydrate drink on the way to the hospital for bariatric patients  ____Ensure clear carbohydrate drink 3 hours before surgery for Dr Dwyane Luo patients if physician instructed.   No gum chewing or hard candies.     __x__ 2. No Alcohol for 24 hours before or after surgery.   __x__3. No Smoking or e-cigarettes for 24 prior to surgery.  Do not use any chewable tobacco products for at least 6 hour prior to surgery   ____  4. Bring all medications with you on the day of surgery if instructed.    __x__ 5. Notify your doctor if there is any change in your medical condition     (cold, fever, infections).    x___6. On the morning of surgery brush your teeth with toothpaste and water.  You may rinse your mouth with mouth wash if you wish.  Do not swallow any toothpaste or mouthwash.   Do not wear jewelry, make-up,  hairpins, clips or nail polish.  Do not wear lotions, powders, or perfumes. You may wear deodorant.  Do not shave 48 hours prior to surgery. Men may shave face and neck.  Do not bring valuables to the hospital.    [facility] is not responsible for any belongings or valuables.               Contacts, dentures or bridgework may not be worn into surgery.  Leave your suitcase in the car. After surgery it may be brought to your room.  For patients admitted to the hospital, discharge time is determined by your                       treatment team.  _  Patients discharged the day of surgery will not be allowed to drive home.  You will need someone to drive you home and stay with you the night of your procedure.    Please read over the following fact sheets that you were given:   [facility] Preparing for Surgery and or MRSA Information   _x___ Take anti-hypertensive listed below, cardiac, seizure, asthma,     anti-reflux and psychiatric medicines. These include:  1. albuterol (PROVENTIL   2.amLODipine (NORVASC) 10 MG tablet  3.esomeprazole (NEXIUM) 40 MG capsule  4.gabapentin (NEURONTIN  5.venlafaxine XR (EFFEXOR-XR) 150 MG 24 hr capsule  6.Treligy  ____Fleets enema or Magnesium Citrate as directed.   ___ Use CHG Soap or sage wipes as directed on instruction sheet   __x__ Use inhalers on the day of surgery and bring to hospital day of surgery  __x__ Stop Metformin and Janumet 2 days prior to surgery.    ____ Take 1/2 of usual insulin dose the night before surgery and none on the morning     surgery.   _x___ Follow recommendations from Cardiologist, Pulmonologist or PCP regarding          stopping Aspirin, Coumadin, Plavix ,Eliquis, Effient, or Pradaxa, and Pletal.  X____Stop Anti-inflammatories such as Advil, Aleve, Ibuprofen, Motrin, Naproxen, Naprosyn, Goodies powders or aspirin products. OK to take Tylenol and                          Celebrex.   _x___ Stop supplements until after  surgery.  But may continue Vitamin D, Vitamin B,       and multivitamin.   ____ Bring C-Pap to the hospital.

## 2019-03-03 ENCOUNTER — Other Ambulatory Visit: Payer: Medicare HMO

## 2019-03-03 ENCOUNTER — Other Ambulatory Visit
Admission: RE | Admit: 2019-03-03 | Discharge: 2019-03-03 | Disposition: A | Discharge status: Home / Self Care | Payer: Medicare HMO | Source: Ambulatory Visit | Attending: Pulmonary Disease | Admitting: Pulmonary Disease

## 2019-03-03 ENCOUNTER — Other Ambulatory Visit: Payer: Self-pay

## 2019-03-03 DIAGNOSIS — Z01812 Encounter for preprocedural laboratory examination: Secondary | ICD-10-CM | POA: Diagnosis not present

## 2019-03-03 DIAGNOSIS — E1142 Type 2 diabetes mellitus with diabetic polyneuropathy: Secondary | ICD-10-CM | POA: Diagnosis not present

## 2019-03-03 DIAGNOSIS — I10 Essential (primary) hypertension: Secondary | ICD-10-CM | POA: Insufficient documentation

## 2019-03-03 DIAGNOSIS — I2510 Atherosclerotic heart disease of native coronary artery without angina pectoris: Secondary | ICD-10-CM | POA: Diagnosis not present

## 2019-03-03 DIAGNOSIS — Z1159 Encounter for screening for other viral diseases: Secondary | ICD-10-CM | POA: Insufficient documentation

## 2019-03-03 DIAGNOSIS — J449 Chronic obstructive pulmonary disease, unspecified: Secondary | ICD-10-CM | POA: Diagnosis not present

## 2019-03-03 DIAGNOSIS — F17210 Nicotine dependence, cigarettes, uncomplicated: Secondary | ICD-10-CM | POA: Insufficient documentation

## 2019-03-03 LAB — CBC WITH DIFFERENTIAL/PLATELET
Abs Immature Granulocytes: 0.03 10*3/uL (ref 0.00–0.07)
Basophils Absolute: 0 10*3/uL (ref 0.0–0.1)
Basophils Relative: 0 %
Eosinophils Absolute: 0.6 10*3/uL — ABNORMAL HIGH (ref 0.0–0.5)
Eosinophils Relative: 6 %
HCT: 43.5 % (ref 36.0–46.0)
Hemoglobin: 12.7 g/dL (ref 12.0–15.0)
Immature Granulocytes: 0 %
Lymphocytes Relative: 16 %
Lymphs Abs: 1.5 10*3/uL (ref 0.7–4.0)
MCH: 27 pg (ref 26.0–34.0)
MCHC: 29.2 g/dL — ABNORMAL LOW (ref 30.0–36.0)
MCV: 92.4 fL (ref 80.0–100.0)
Monocytes Absolute: 0.7 10*3/uL (ref 0.1–1.0)
Monocytes Relative: 7 %
Neutro Abs: 6.2 10*3/uL (ref 1.7–7.7)
Neutrophils Relative %: 71 %
Platelets: 326 10*3/uL (ref 150–400)
RBC: 4.71 MIL/uL (ref 3.87–5.11)
RDW: 18.8 % — ABNORMAL HIGH (ref 11.5–15.5)
WBC: 8.9 10*3/uL (ref 4.0–10.5)
nRBC: 0 % (ref 0.0–0.2)

## 2019-03-03 LAB — PROTIME-INR
INR: 0.9 (ref 0.8–1.2)
Prothrombin Time: 12.4 seconds (ref 11.4–15.2)

## 2019-03-03 LAB — APTT: aPTT: 28 seconds (ref 24–36)

## 2019-03-04 LAB — NOVEL CORONAVIRUS, NAA (HOSP ORDER, SEND-OUT TO REF LAB; TAT 18-24 HRS): SARS-CoV-2, NAA: NOT DETECTED

## 2019-03-06 ENCOUNTER — Ambulatory Visit: Payer: Medicare HMO

## 2019-03-06 ENCOUNTER — Encounter
Admission: RE | Disposition: A | Discharge status: Home / Self Care | Payer: Self-pay | Source: Home / Self Care | Attending: Pulmonary Disease

## 2019-03-06 ENCOUNTER — Encounter: Payer: Self-pay | Admitting: Certified Registered Nurse Anesthetist

## 2019-03-06 ENCOUNTER — Other Ambulatory Visit: Payer: Self-pay

## 2019-03-06 ENCOUNTER — Ambulatory Visit: Payer: Medicare HMO | Admitting: Certified Registered Nurse Anesthetist

## 2019-03-06 ENCOUNTER — Ambulatory Visit
Admission: RE | Admit: 2019-03-06 | Discharge: 2019-03-06 | Disposition: A | Discharge status: Home / Self Care | Payer: Medicare HMO | Attending: Pulmonary Disease | Admitting: Pulmonary Disease

## 2019-03-06 DIAGNOSIS — Z9981 Dependence on supplemental oxygen: Secondary | ICD-10-CM | POA: Diagnosis not present

## 2019-03-06 DIAGNOSIS — I10 Essential (primary) hypertension: Secondary | ICD-10-CM | POA: Diagnosis not present

## 2019-03-06 DIAGNOSIS — E782 Mixed hyperlipidemia: Secondary | ICD-10-CM | POA: Diagnosis not present

## 2019-03-06 DIAGNOSIS — Z955 Presence of coronary angioplasty implant and graft: Secondary | ICD-10-CM | POA: Insufficient documentation

## 2019-03-06 DIAGNOSIS — F17210 Nicotine dependence, cigarettes, uncomplicated: Secondary | ICD-10-CM | POA: Diagnosis not present

## 2019-03-06 DIAGNOSIS — J449 Chronic obstructive pulmonary disease, unspecified: Secondary | ICD-10-CM | POA: Diagnosis not present

## 2019-03-06 DIAGNOSIS — R590 Localized enlarged lymph nodes: Secondary | ICD-10-CM | POA: Diagnosis not present

## 2019-03-06 DIAGNOSIS — Z801 Family history of malignant neoplasm of trachea, bronchus and lung: Secondary | ICD-10-CM | POA: Insufficient documentation

## 2019-03-06 DIAGNOSIS — E7800 Pure hypercholesterolemia, unspecified: Secondary | ICD-10-CM | POA: Diagnosis not present

## 2019-03-06 DIAGNOSIS — R918 Other nonspecific abnormal finding of lung field: Secondary | ICD-10-CM | POA: Diagnosis not present

## 2019-03-06 DIAGNOSIS — J439 Emphysema, unspecified: Secondary | ICD-10-CM | POA: Diagnosis not present

## 2019-03-06 DIAGNOSIS — J984 Other disorders of lung: Secondary | ICD-10-CM | POA: Diagnosis not present

## 2019-03-06 DIAGNOSIS — R6889 Other general symptoms and signs: Secondary | ICD-10-CM

## 2019-03-06 DIAGNOSIS — E1142 Type 2 diabetes mellitus with diabetic polyneuropathy: Secondary | ICD-10-CM | POA: Insufficient documentation

## 2019-03-06 DIAGNOSIS — I2510 Atherosclerotic heart disease of native coronary artery without angina pectoris: Secondary | ICD-10-CM | POA: Insufficient documentation

## 2019-03-06 DIAGNOSIS — E1140 Type 2 diabetes mellitus with diabetic neuropathy, unspecified: Secondary | ICD-10-CM | POA: Diagnosis not present

## 2019-03-06 HISTORY — PX: VIDEO BRONCHOSCOPY WITH ENDOBRONCHIAL ULTRASOUND: SHX6177

## 2019-03-06 LAB — GLUCOSE, CAPILLARY: Glucose-Capillary: 121 mg/dL — ABNORMAL HIGH (ref 70–99)

## 2019-03-06 SURGERY — BRONCHOSCOPY, WITH EBUS
Anesthesia: General

## 2019-03-06 MED ORDER — ROCURONIUM BROMIDE 100 MG/10ML IV SOLN
INTRAVENOUS | Status: DC | PRN
  Administered 2019-03-06: 10 mg via INTRAVENOUS
  Administered 2019-03-06: 5 mg via INTRAVENOUS
  Administered 2019-03-06: 25 mg via INTRAVENOUS

## 2019-03-06 MED ORDER — LIDOCAINE HCL (PF) 2 % IJ SOLN
INTRAMUSCULAR | Status: AC
  Filled 2019-03-06: qty 10

## 2019-03-06 MED ORDER — SODIUM CHLORIDE 0.9 % IV SOLN
INTRAVENOUS | Status: DC
  Administered 2019-03-06: 12:00:00 via INTRAVENOUS

## 2019-03-06 MED ORDER — FENTANYL CITRATE (PF) 100 MCG/2ML IJ SOLN: 25.0000 ug | INTRAMUSCULAR | Status: DC | PRN

## 2019-03-06 MED ORDER — ONDANSETRON HCL 4 MG/2ML IJ SOLN
INTRAMUSCULAR | Status: AC
  Filled 2019-03-06: qty 2

## 2019-03-06 MED ORDER — ROCURONIUM BROMIDE 50 MG/5ML IV SOLN
INTRAVENOUS | Status: AC
  Filled 2019-03-06: qty 1

## 2019-03-06 MED ORDER — PHENYLEPHRINE HCL 0.25 % NA SOLN
1.0000 | Freq: Four times a day (QID) | NASAL | Status: DC | PRN
  Filled 2019-03-06: qty 15

## 2019-03-06 MED ORDER — SUCCINYLCHOLINE CHLORIDE 20 MG/ML IJ SOLN
INTRAMUSCULAR | Status: AC
  Filled 2019-03-06: qty 1

## 2019-03-06 MED ORDER — LIDOCAINE HCL 2 % EX GEL
1.0000 "application " | Freq: Once | CUTANEOUS | Status: DC
  Filled 2019-03-06: qty 4250

## 2019-03-06 MED ORDER — DEXAMETHASONE SODIUM PHOSPHATE 10 MG/ML IJ SOLN
INTRAMUSCULAR | Status: DC | PRN
  Administered 2019-03-06: 8 mg via INTRAVENOUS

## 2019-03-06 MED ORDER — IPRATROPIUM-ALBUTEROL 0.5-2.5 (3) MG/3ML IN SOLN: 3.0000 mL | Freq: Once | RESPIRATORY_TRACT | Status: DC

## 2019-03-06 MED ORDER — FENTANYL CITRATE (PF) 100 MCG/2ML IJ SOLN
INTRAMUSCULAR | Status: AC
  Filled 2019-03-06: qty 2

## 2019-03-06 MED ORDER — LIDOCAINE HCL (CARDIAC) PF 100 MG/5ML IV SOSY
PREFILLED_SYRINGE | INTRAVENOUS | Status: DC | PRN
  Administered 2019-03-06: 100 mg via INTRAVENOUS

## 2019-03-06 MED ORDER — MIDAZOLAM HCL 2 MG/2ML IJ SOLN
INTRAMUSCULAR | Status: DC | PRN
  Administered 2019-03-06: 1 mg via INTRAVENOUS

## 2019-03-06 MED ORDER — PROPOFOL 10 MG/ML IV BOLUS
INTRAVENOUS | Status: AC
  Filled 2019-03-06: qty 20

## 2019-03-06 MED ORDER — DEXAMETHASONE SODIUM PHOSPHATE 4 MG/ML IJ SOLN
INTRAMUSCULAR | Status: AC
  Filled 2019-03-06: qty 2

## 2019-03-06 MED ORDER — IPRATROPIUM-ALBUTEROL 0.5-2.5 (3) MG/3ML IN SOLN
RESPIRATORY_TRACT | Status: AC
  Filled 2019-03-06: qty 3

## 2019-03-06 MED ORDER — MIDAZOLAM HCL 2 MG/2ML IJ SOLN
INTRAMUSCULAR | Status: AC
  Filled 2019-03-06: qty 2

## 2019-03-06 MED ORDER — IPRATROPIUM-ALBUTEROL 0.5-2.5 (3) MG/3ML IN SOLN
RESPIRATORY_TRACT | Status: AC
  Administered 2019-03-06: 3 mL
  Filled 2019-03-06: qty 3

## 2019-03-06 MED ORDER — IPRATROPIUM-ALBUTEROL 0.5-2.5 (3) MG/3ML IN SOLN
3.0000 mL | Freq: Once | RESPIRATORY_TRACT | Status: AC
  Administered 2019-03-06: 3 mL via RESPIRATORY_TRACT

## 2019-03-06 MED ORDER — ONDANSETRON HCL 4 MG/2ML IJ SOLN: 4.0000 mg | Freq: Once | INTRAMUSCULAR | Status: DC | PRN

## 2019-03-06 MED ORDER — LIDOCAINE HCL (PF) 1 % IJ SOLN: 30.0000 mL | Freq: Once | INTRAMUSCULAR | Status: DC

## 2019-03-06 MED ORDER — ONDANSETRON HCL 4 MG/2ML IJ SOLN
INTRAMUSCULAR | Status: DC | PRN
  Administered 2019-03-06: 4 mg via INTRAVENOUS

## 2019-03-06 MED ORDER — GLYCOPYRROLATE 0.2 MG/ML IJ SOLN
INTRAMUSCULAR | Status: AC
  Filled 2019-03-06: qty 1

## 2019-03-06 MED ORDER — SUGAMMADEX SODIUM 200 MG/2ML IV SOLN
INTRAVENOUS | Status: AC
  Filled 2019-03-06: qty 2

## 2019-03-06 MED ORDER — SUCCINYLCHOLINE CHLORIDE 20 MG/ML IJ SOLN
INTRAMUSCULAR | Status: DC | PRN
  Administered 2019-03-06: 80 mg via INTRAVENOUS

## 2019-03-06 MED ORDER — PROPOFOL 10 MG/ML IV BOLUS
INTRAVENOUS | Status: DC | PRN
  Administered 2019-03-06: 120 mg via INTRAVENOUS

## 2019-03-06 MED ORDER — SUGAMMADEX SODIUM 200 MG/2ML IV SOLN
INTRAVENOUS | Status: DC | PRN
  Administered 2019-03-06: 200 mg via INTRAVENOUS

## 2019-03-06 MED ORDER — FENTANYL CITRATE (PF) 100 MCG/2ML IJ SOLN
INTRAMUSCULAR | Status: DC | PRN
  Administered 2019-03-06: 25 ug via INTRAVENOUS
  Administered 2019-03-06: 50 ug via INTRAVENOUS

## 2019-03-06 MED ORDER — BUTAMBEN-TETRACAINE-BENZOCAINE 2-2-14 % EX AERO
1.0000 | INHALATION_SPRAY | Freq: Once | CUTANEOUS | Status: DC
  Filled 2019-03-06: qty 20

## 2019-03-06 NOTE — H&P (Signed)
Pulmonary Medicine          Date: 03/06/2019,   MRN# [id number] Suzanne Glenn 05-31-1951     AdmissionWeight: 86.8 kg                 CurrentWeight: 86.8 kg         HISTORY OF PRESENT ILLNESS   This is a pleasant 68 year old female with a history of lifelong smoking as well as severe COPD.  She has a background history of anxiety asthma, diabetes, migraines, essential hypertension, peripheral neuropathy, restless leg syndrome, coronary artery disease, she has been on multiple inhalers and continues to smoke.  Recently she had CT chest suggestive of malignancy with hilar and mediastinal lymphadenopathy.  She is being followed by Dr. Raul Del pulmonology with clinical clinic.  Today she is being admitted for hilar mediastinal lymph node biopsies via EBUS and fiberoptic bronchoscopy.  She denies having recent flulike illness, chest pain, chest discomfort, cough, hemoptysis.  We have discussed procedure at length today including risk and benefit discussion as well as complications of procedures including but not limited to death, bleeding, infection, pneumothorax requiring chest tube, pneumomediastinum requiring chest tube and possible mechanical ventilation, have answered all questions regarding procedure.  Her most recent PFT as below. FVC 1.71 liters (62%), FEV1 0.86 Liters ( 39% ), ratio 50, fef 25-75 1 %,   PAST MEDICAL HISTORY   Past Medical History:  Diagnosis Date  . Anxiety   . Asthma   . Cancer ([place]) 12/2018   w/u for right upper lobe mass/cancer  . COPD (chronic obstructive pulmonary disease) (HCC)    also, emphysema. now using o2 via np 24 hours a day  . Coronary artery disease   . Depression   . Diabetes mellitus, type II ([place])   . GERD (gastroesophageal reflux disease)   . Headache    migraines in early 20's  . Heart murmur   . Hypertension   . Neuropathy   . Restless leg      SURGICAL HISTORY   Past Surgical History:  Procedure Laterality Date   . BACK SURGERY  2005   surgery x 2, disc fused in neck, pinched nerve in center of back and neck  . CARDIAC CATHETERIZATION  2015   1 stent placed for blockage  . cervical bone infusion    . CHOLECYSTECTOMY    . THORACIC DISC SURGERY    . TUBAL LIGATION       FAMILY HISTORY   Family History  Problem Relation Age of Onset  . Anxiety disorder Mother   . Cancer - Lung Mother   . Depression Sister   . Cancer Sister   . Depression Brother   . Cancer Sister   . Cancer Sister   . Cancer Sister   . Heart Problems Sister   . Bipolar disorder Sister   . Diabetes Sister   . Cancer - Lung Sister   . Hypertension Sister   . Diabetes Sister   . Hyperlipidemia Sister   . COPD Sister   . Heart Problems Brother   . Cancer Brother   . Cancer Brother   . Cancer Brother   . Breast cancer Maternal Aunt      SOCIAL HISTORY   Social History   Tobacco Use  . Smoking status: Current Every Day Smoker    Packs/day: 1.00    Years: 40.00    Pack years: 40.00    Types: Cigarettes    Start  date: 05/05/1975  . Smokeless tobacco: Never Used  Substance Use Topics  . Alcohol use: No    Alcohol/week: 0.0 standard drinks    Comment: socially  . Drug use: No     MEDICATIONS    Home Medication:    Current Medication:  Current Facility-Administered Medications:  .  0.9 %  sodium chloride infusion, , Intravenous, Continuous, Durenda Hurt, MD, Last Rate: 20 mL/hr at 03/06/19 1148 .  butamben-tetracaine-benzocaine (CETACAINE) spray 1 spray, 1 spray, Topical, Once, Alvah Lagrow, MD .  lidocaine (PF) (XYLOCAINE) 1 % injection 30 mL, 30 mL, Other, Once, Burdell Peed, MD .  lidocaine (XYLOCAINE) 2 % jelly 1 application, 1 application, Topical, Once, Aletha Allebach, MD .  phenylephrine (NEO-SYNEPHRINE) 0.25 % nasal spray 1 spray, 1 spray, Each Nare, Q6H PRN, Ottie Glazier, MD    ALLERGIES   Diclofenac-misoprostol; Nsaids; Zolpidem; Hydrocodone-acetaminophen; and  Simvastatin     REVIEW OF SYSTEMS    Review of Systems:  Gen:  Denies  fever, sweats, chills weigh loss  HEENT: Denies blurred vision, double vision, ear pain, eye pain, hearing loss, nose bleeds, sore throat Cardiac:  No dizziness, chest pain or heaviness, chest tightness,edema Resp:   Denies cough or sputum porduction, shortness of breath,wheezing, hemoptysis,  Gi: Denies swallowing difficulty, stomach pain, nausea or vomiting, diarrhea, constipation, bowel incontinence Gu:  Denies bladder incontinence, burning urine Ext:   Denies Joint pain, stiffness or swelling Skin: Denies  skin rash, easy bruising or bleeding or hives Endoc:  Denies polyuria, polydipsia , polyphagia or weight change Psych:   Denies depression, insomnia or hallucinations   Other:  All other systems negative   VS: BP 138/75   Pulse 73   Temp (!) 97.4 F (36.3 C) (Temporal)   Resp 20   Ht 5\' 3"  (1.6 m)   Wt 86.8 kg   SpO2 99%   BMI 33.89 kg/m      PHYSICAL EXAM    GENERAL:NAD, no fevers, chills, no weakness no fatigue HEAD: Normocephalic, atraumatic.  EYES: Pupils equal, round, reactive to light. Extraocular muscles intact. No scleral icterus.  MOUTH: Moist mucosal membrane. Dentition intact. No abscess noted.  EAR, NOSE, THROAT: Clear without exudates. No external lesions.  NECK: Supple. No thyromegaly. No nodules. No JVD.  PULMONARY: Diffuse coarse rhonchi right sided +wheezes CARDIOVASCULAR: S1 and S2. Regular rate and rhythm. No murmurs, rubs, or gallops. No edema. Pedal pulses 2+ bilaterally.  GASTROINTESTINAL: Soft, nontender, nondistended. No masses. Positive bowel sounds. No hepatosplenomegaly.  MUSCULOSKELETAL: No swelling, clubbing, or edema. Range of motion full in all extremities.  NEUROLOGIC: Cranial nerves II through XII are intact. No gross focal neurological deficits. Sensation intact. Reflexes intact.  SKIN: No ulceration, lesions, rashes, or cyanosis. Skin warm and dry. Turgor  intact.  PSYCHIATRIC: Mood, affect within normal limits. The patient is awake, alert and oriented x 3. Insight, judgment intact.       IMAGING    No results found.    ASSESSMENT/PLAN    Left lung mass over 3 cm with hilar mediastinal lymphadenopathy suggestive of malignancy -Plan for EBUS with mediastinal staging as well as fluoroscopy assisted biopsies of mass We have discussed procedure at length today including risk and benefit discussion as well as complications of procedures including but not limited to death, bleeding, infection, pneumothorax requiring chest tube, pneumomediastinum requiring chest tube and possible mechanical ventilation, have answered all questions regarding procedure.  Her most recent PFT as below. FVC 1.71 liters (62%), FEV1 0.86  Liters ( 39% ), ratio 50, fef 25-75 1 %,      Thank you for allowing me to participate in the care of this patient.    Patient/Family are satisfied with care plan and all questions have been answered.  This document was prepared using Dragon voice recognition software and may include unintentional dictation errors.     Ottie Glazier, M.D.  Division of [facility]

## 2019-03-06 NOTE — Procedures (Signed)
FIBEROPTIC BRONCHOSCOPY AND ENDOBRONCHIAL ULTRASOUND PROCEDURE NOTE    Flexible bronchoscopy was performed on 03/06/19 by : Lanney Gins MD   assistance by : 1)Stacey RT    Indication for the procedure was : Left lung mass   Pre-procedural H&P. The following assessment was performed on the day of the procedure prior to initiating sedation History:  Chest pain n Dyspnea n Hemoptysis n Cough n Fever n Other pertinent items n  Examination Vital signs -reviewed as per nursing documentation today Cardiac    Murmurs: n  Rubs : n  Gallop: n Lungs Wheezing: yes  Rales : n Rhonchi {:yes  Other pertinent findings: n   Pre-procedural assessment for Procedural Sedation included: Depth of sedation: As per anesthesia team  ASA Classification:  3 Mallampati airway assessment: 2    Medication list reviewed: n  The patient's interval history was taken and revealed: no new complaints The pre- procedure physical examination revealed: No new findings Refer to prior clinic note for details.  Informed Consent: Informed consent was obtained from:  patient after explanation of procedure and risks, benefits, as well as alternative procedures available.  Explanation of level of sedation and possible transfusion was also provided.    Procedural Preparation: Time out was performed and patient was identified by name and birthdate and procedure to be performed and side for sampling, if any, was specified. Pt was intubated by anesthesia.  The patient was appropriately draped.  Procedure Findings: Bronchoscope was inserted via ETT  without difficulty.  Posterior oropharynx, epiglottis, arytenoids, false cords and vocal cords were not visualized as these were bypassed by endotracheal tube.   The distal trachea was normal in circumference and appearance without mucosal, cartilaginous or branching abnormalities.  The main carina was splayed . All right and left lobar airways were visualized to  the Subsegmental level.  Sub- sub segmental carinae were identified in all the distal airways.   Secretions were visible in the following airways and appeared to be thick phlegm which was evacutated.  The mucosa was : mildly friable  Airways were notable for:        exophytic lesions :n       extrinsic compression in the following distributions: n.       Friable mucosa: mildly       Anthrocotic material /pigmentation: n   Pictorial documentation attached: n     -------------------------------------------------------------------- The fiberoptic bronchoscope was removed and the EBUS scope was introduced. Examination began to evaluate for pathologically enlarged lymph nodes starting on the right side progressing to the left in the following sequence: 11 R- 10 R- 4R-7- 4L- 10 L-11 L- left lung mass forcep biopsy.  Lymph node biopsies were sent in cytolite for all stations.  Station 11 R was normal and size was not biopsied 10 R was evaluated next and was noted to be 0.75 cm and was not biopsied, station 4R was noted to be 0.6 cm and was not biopsied as well neck station 7 was evaluated main carina was splayed lymph node was 2.1 cm 3 specimens were obtained with lymphoid shedding, next station for L was evaluated and was noted to be 0.8 cm and was not biopsied, next station 10 L was evaluated and was measured to be approximately 1.7 cm and 3 biopsies were obtained with good lymphoid shedding, next station 11 L was evaluated and was measured to be 1.9 cm and had 3 adequate specimens with good lymphoid shedding obtained.  Lastly with fluoroscopy guidance left lung  mass was accessed and 5 biopsies were obtained.  After procedure bronchoscope was removed via endotracheal tube without resistance with confirmation of a balloon at the tip.     Immediate sampling complications included:none  Epinephrine 76m was used topically  The bronchoscopy was terminated due to completion of the planned procedure and the  bronchoscope was removed.   Total dosage of Lidocaine was 071mTotal fluoroscopy time was 6 minutes    Estimated Blood loss: 15-30cc.  Complications included:  none immediate   Preliminary CXR findings :  Pending ordered   Disposition: home   Follow up with Dr. AlLanney Ginsin 10 days for result discussion.   FrClaudette StaplerD  [facility]

## 2019-03-06 NOTE — Anesthesia Preprocedure Evaluation (Signed)
Anesthesia Evaluation  Patient identified by MRN, date of birth, ID band Patient awake    Reviewed: Allergy & Precautions, NPO status , Patient's Chart, lab work & pertinent test results  History of Anesthesia Complications Negative for: history of anesthetic complications  Airway Mallampati: II  TM Distance: >3 FB Neck ROM: Full    Dental  (+) Poor Dentition, Loose,    Pulmonary asthma , COPD,  COPD inhaler and oxygen dependent, Current Smoker,    breath sounds clear to auscultation- rhonchi (-) wheezing      Cardiovascular hypertension, (-) angina+ CAD and + Cardiac Stents  (-) Past MI and (-) CABG  Rhythm:Regular Rate:Normal - Systolic murmurs and - Diastolic murmurs    Neuro/Psych  Headaches, PSYCHIATRIC DISORDERS Anxiety Depression    GI/Hepatic Neg liver ROS, GERD  ,  Endo/Other  diabetes  Renal/GU negative Renal ROS     Musculoskeletal  (+) Fibromyalgia -  Abdominal (+) + obese,   Peds  Hematology  (+) anemia ,   Anesthesia Other Findings Past Medical History: No date: Anxiety No date: Asthma 12/2018: Cancer ([place])     Comment:  w/u for right upper lobe mass/cancer No date: COPD (chronic obstructive pulmonary disease) (HCC)     Comment:  also, emphysema. now using o2 via np 24 hours a day No date: Coronary artery disease No date: Depression No date: Diabetes mellitus, type II (HCC) No date: GERD (gastroesophageal reflux disease) No date: Headache     Comment:  migraines in early 20's No date: Heart murmur No date: Hypertension No date: Neuropathy No date: Restless leg   Reproductive/Obstetrics                             Anesthesia Physical Anesthesia Plan  ASA: IV  Anesthesia Plan: General   Post-op Pain Management:    Induction: Intravenous  PONV Risk Score and Plan: 1 and Ondansetron  Airway Management Planned: Oral ETT  Additional Equipment:   Intra-op  Plan:   Post-operative Plan: Extubation in OR and Possible Post-op intubation/ventilation  Informed Consent: I have reviewed the patients History and Physical, chart, labs and discussed the procedure including the risks, benefits and alternatives for the proposed anesthesia with the patient or authorized representative who has indicated his/her understanding and acceptance.     Dental advisory given  Plan Discussed with: CRNA and Anesthesiologist  Anesthesia Plan Comments:         Anesthesia Quick Evaluation

## 2019-03-06 NOTE — Transfer of Care (Signed)
Immediate Anesthesia Transfer of Care Note  Patient: Suzanne Glenn  Procedure(s) Performed: VIDEO BRONCHOSCOPY WITH ENDOBRONCHIAL ULTRASOUND - DIABETIC (N/A )  Patient Location: PACU  Anesthesia Type:General  Level of Consciousness: awake, alert  and oriented  Airway & Oxygen Therapy: Patient Spontanous Breathing and Patient connected to face mask oxygen  Post-op Assessment: Report given to RN and Post -op Vital signs reviewed and stable  Post vital signs: Reviewed and stable  Last Vitals:  Vitals Value Taken Time  BP 153/76 03/06/2019  2:18 PM  Temp    Pulse 116 03/06/2019  2:18 PM  Resp 18 03/06/2019  2:18 PM  SpO2 93 % 03/06/2019  2:18 PM  Vitals shown include unvalidated device data.  Last Pain:  Vitals:   03/06/19 1135  TempSrc: Temporal  PainSc: 0-No pain         Complications: No apparent anesthesia complications

## 2019-03-06 NOTE — Anesthesia Post-op Follow-up Note (Signed)
Anesthesia QCDR form completed.        

## 2019-03-06 NOTE — Anesthesia Procedure Notes (Signed)
Procedure Name: Intubation Date/Time: 03/06/2019 12:29 PM Performed by: Johnna Acosta, CRNA Pre-anesthesia Checklist: Patient identified, Emergency Drugs available, Suction available, Patient being monitored and Timeout performed Patient Re-evaluated:Patient Re-evaluated prior to induction Oxygen Delivery Method: Circle system utilized Preoxygenation: Pre-oxygenation with 100% oxygen Induction Type: IV induction Ventilation: Mask ventilation without difficulty and Oral airway inserted - appropriate to patient size Laryngoscope Size: Sabra Heck and 2 Grade View: Grade I Tube type: Oral Tube size: 9.0 mm Number of attempts: 1 Airway Equipment and Method: Stylet and Oral airway Placement Confirmation: ETT inserted through vocal cords under direct vision,  positive ETCO2 and breath sounds checked- equal and bilateral Secured at: 21 cm Dental Injury: Teeth and Oropharynx as per pre-operative assessment  Comments: Loose teeth remain in place

## 2019-03-06 NOTE — Discharge Instructions (Signed)
Flexible Bronchoscopy, Care After This sheet gives you information about how to care for yourself after your test. Your doctor may also give you more specific instructions. If you have problems or questions, contact your doctor. Follow these instructions at home: Eating and drinking  Do not eat or drink anything (not even water) for 2 hours after your test, or until your numbing medicine (local anesthetic) wears off.  When your numbness is gone and your cough and gag reflexes have come back, you may: ? Eat only soft foods. ? Slowly drink liquids.  The day after the test, go back to your normal diet. Driving  Do not drive for 24 hours if you were given a medicine to help you relax (sedative).  Do not drive or use heavy machinery while taking prescription pain medicine. General instructions   Take over-the-counter and prescription medicines only as told by your doctor.  Return to your normal activities as told. Ask what activities are safe for you.  Do not use any products that have nicotine or tobacco in them. This includes cigarettes and e-cigarettes. If you need help quitting, ask your doctor.  Keep all follow-up visits as told by your doctor. This is important. It is very important if you had a tissue sample (biopsy) taken. Get help right away if:  You have shortness of breath that gets worse.  You get light-headed.  You feel like you are going to pass out (faint).  You have chest pain.  You cough up: ? More than a little blood. ? More blood than before. Summary  Do not eat or drink anything (not even water) for 2 hours after your test, or until your numbing medicine wears off.  Do not use cigarettes. Do not use e-cigarettes.  Get help right away if you have chest pain. This information is not intended to replace advice given to you by your health care provider. Make sure you discuss any questions you have with your health care provider. Document Released: 07/29/2009  Document Revised: 10/19/2016 Document Reviewed: 10/19/2016 Elsevier Interactive Patient Education  2019 [place]   1) The drugs that you were given will stay in your system until tomorrow so for the next 24 hours you should not:  A) Drive an automobile B) Make any legal decisions C) Drink any alcoholic beverage   2) You may resume regular meals tomorrow.  Today it is better to start with liquids and gradually work up to solid foods.  You may eat anything you prefer, but it is better to start with liquids, then soup and crackers, and gradually work up to solid foods.   3) Please notify your doctor immediately if you have any unusual bleeding, trouble breathing, redness and pain at the surgery site, drainage, fever, or pain not relieved by medication.    4) Additional Instructions:        Please contact your physician with any problems or Same Day Surgery at [phone], Monday through Friday 6 am to 4 pm, or [place] at [facility] number at [phone].

## 2019-03-08 ENCOUNTER — Encounter: Payer: Self-pay | Admitting: Pulmonary Disease

## 2019-03-10 LAB — CYTOLOGY - NON PAP

## 2019-03-10 LAB — SURGICAL PATHOLOGY

## 2019-03-10 NOTE — Anesthesia Postprocedure Evaluation (Signed)
Anesthesia Post Note  Patient: SHELBIE FRANKEN  Procedure(s) Performed: VIDEO BRONCHOSCOPY WITH ENDOBRONCHIAL ULTRASOUND - DIABETIC (N/A )  Patient location during evaluation: PACU Anesthesia Type: General Level of consciousness: awake and alert and oriented Pain management: pain level controlled Vital Signs Assessment: post-procedure vital signs reviewed and stable Respiratory status: spontaneous breathing, nonlabored ventilation and respiratory function stable Cardiovascular status: blood pressure returned to baseline and stable Postop Assessment: no signs of nausea or vomiting Anesthetic complications: no     Last Vitals:  Vitals:   03/06/19 1504 03/06/19 1541  BP: (!) 141/75 129/77  Pulse: 85 87  Resp: (!) 22 20  Temp: (!) 36.3 C 36.6 C  SpO2: 97% 97%    Last Pain:  Vitals:   03/06/19 1541  TempSrc:   PainSc: 0-No pain                 Laycie Schriner

## 2019-03-11 LAB — CYTOLOGY - NON PAP

## 2019-03-20 DIAGNOSIS — J449 Chronic obstructive pulmonary disease, unspecified: Secondary | ICD-10-CM | POA: Diagnosis not present

## 2019-03-20 DIAGNOSIS — Z09 Encounter for follow-up examination after completed treatment for conditions other than malignant neoplasm: Secondary | ICD-10-CM | POA: Diagnosis not present

## 2019-03-20 DIAGNOSIS — J9611 Chronic respiratory failure with hypoxia: Secondary | ICD-10-CM | POA: Diagnosis not present

## 2019-03-20 DIAGNOSIS — I2510 Atherosclerotic heart disease of native coronary artery without angina pectoris: Secondary | ICD-10-CM | POA: Diagnosis not present

## 2019-03-20 DIAGNOSIS — I10 Essential (primary) hypertension: Secondary | ICD-10-CM | POA: Diagnosis not present

## 2019-03-20 DIAGNOSIS — G2581 Restless legs syndrome: Secondary | ICD-10-CM | POA: Diagnosis not present

## 2019-03-20 DIAGNOSIS — R918 Other nonspecific abnormal finding of lung field: Secondary | ICD-10-CM | POA: Diagnosis not present

## 2019-03-20 DIAGNOSIS — D649 Anemia, unspecified: Secondary | ICD-10-CM | POA: Diagnosis not present

## 2019-03-20 DIAGNOSIS — E1165 Type 2 diabetes mellitus with hyperglycemia: Secondary | ICD-10-CM | POA: Diagnosis not present

## 2019-03-24 ENCOUNTER — Other Ambulatory Visit: Payer: Self-pay | Admitting: Specialist

## 2019-03-24 DIAGNOSIS — R918 Other nonspecific abnormal finding of lung field: Secondary | ICD-10-CM | POA: Diagnosis not present

## 2019-03-24 DIAGNOSIS — R0600 Dyspnea, unspecified: Secondary | ICD-10-CM | POA: Diagnosis not present

## 2019-03-24 DIAGNOSIS — J449 Chronic obstructive pulmonary disease, unspecified: Secondary | ICD-10-CM | POA: Diagnosis not present

## 2019-03-27 DIAGNOSIS — I10 Essential (primary) hypertension: Secondary | ICD-10-CM | POA: Diagnosis not present

## 2019-03-27 DIAGNOSIS — F17210 Nicotine dependence, cigarettes, uncomplicated: Secondary | ICD-10-CM | POA: Diagnosis not present

## 2019-03-27 DIAGNOSIS — I2510 Atherosclerotic heart disease of native coronary artery without angina pectoris: Secondary | ICD-10-CM | POA: Diagnosis not present

## 2019-03-27 DIAGNOSIS — G2581 Restless legs syndrome: Secondary | ICD-10-CM | POA: Diagnosis not present

## 2019-03-27 DIAGNOSIS — N3941 Urge incontinence: Secondary | ICD-10-CM | POA: Insufficient documentation

## 2019-03-27 DIAGNOSIS — J9611 Chronic respiratory failure with hypoxia: Secondary | ICD-10-CM | POA: Insufficient documentation

## 2019-03-27 DIAGNOSIS — F3341 Major depressive disorder, recurrent, in partial remission: Secondary | ICD-10-CM | POA: Insufficient documentation

## 2019-03-27 DIAGNOSIS — E7800 Pure hypercholesterolemia, unspecified: Secondary | ICD-10-CM | POA: Diagnosis not present

## 2019-03-27 DIAGNOSIS — F331 Major depressive disorder, recurrent, moderate: Secondary | ICD-10-CM | POA: Diagnosis not present

## 2019-03-27 DIAGNOSIS — K219 Gastro-esophageal reflux disease without esophagitis: Secondary | ICD-10-CM | POA: Diagnosis not present

## 2019-03-27 DIAGNOSIS — F411 Generalized anxiety disorder: Secondary | ICD-10-CM | POA: Diagnosis not present

## 2019-03-27 DIAGNOSIS — Z0000 Encounter for general adult medical examination without abnormal findings: Secondary | ICD-10-CM | POA: Diagnosis not present

## 2019-03-27 DIAGNOSIS — Z720 Tobacco use: Secondary | ICD-10-CM | POA: Diagnosis not present

## 2019-03-27 DIAGNOSIS — E782 Mixed hyperlipidemia: Secondary | ICD-10-CM | POA: Diagnosis not present

## 2019-03-27 DIAGNOSIS — F17209 Nicotine dependence, unspecified, with unspecified nicotine-induced disorders: Secondary | ICD-10-CM | POA: Diagnosis not present

## 2019-04-07 ENCOUNTER — Ambulatory Visit: Admission: RE | Admit: 2019-04-07 | Payer: Medicare HMO | Source: Ambulatory Visit

## 2019-04-09 ENCOUNTER — Other Ambulatory Visit: Payer: Self-pay

## 2019-04-09 ENCOUNTER — Other Ambulatory Visit: Payer: Self-pay | Admitting: Specialist

## 2019-04-09 ENCOUNTER — Ambulatory Visit
Admission: RE | Admit: 2019-04-09 | Discharge: 2019-04-09 | Disposition: A | Discharge status: Home / Self Care | Payer: Medicare HMO | Source: Ambulatory Visit | Attending: Specialist | Admitting: Specialist

## 2019-04-09 DIAGNOSIS — R918 Other nonspecific abnormal finding of lung field: Secondary | ICD-10-CM

## 2019-04-09 DIAGNOSIS — J432 Centrilobular emphysema: Secondary | ICD-10-CM | POA: Diagnosis not present

## 2019-04-27 ENCOUNTER — Ambulatory Visit (INDEPENDENT_AMBULATORY_CARE_PROVIDER_SITE_OTHER): Payer: Medicare HMO | Admitting: Psychiatry

## 2019-04-27 ENCOUNTER — Encounter: Payer: Self-pay | Admitting: Psychiatry

## 2019-04-27 ENCOUNTER — Other Ambulatory Visit: Payer: Self-pay

## 2019-04-27 DIAGNOSIS — F411 Generalized anxiety disorder: Secondary | ICD-10-CM | POA: Diagnosis not present

## 2019-04-27 DIAGNOSIS — F331 Major depressive disorder, recurrent, moderate: Secondary | ICD-10-CM

## 2019-04-27 MED ORDER — GABAPENTIN 400 MG PO CAPS: 400.0000 mg | ORAL_CAPSULE | Freq: Two times a day (BID) | ORAL | 2 refills | Status: DC

## 2019-04-27 MED ORDER — GABAPENTIN 100 MG PO CAPS: 100.0000 mg | ORAL_CAPSULE | Freq: Two times a day (BID) | ORAL | 2 refills | Status: DC

## 2019-04-27 MED ORDER — VENLAFAXINE HCL ER 150 MG PO CP24: 150.0000 mg | ORAL_CAPSULE | Freq: Every day | ORAL | 1 refills | Status: DC

## 2019-04-27 NOTE — Progress Notes (Signed)
Patient ID: Suzanne Glenn, female   DOB: 1950/12/03, 68 y.o.   MRN: [id number]   Patient is a 68 year old female with history of depression and anxiety who was managed for medication management.she reported thatshe initially mixed her pills and was having difficulty sleeping but now she is taking them as prescribed. She has been taking gabapentin on a regular basis it has been helping her was sleeping and she sleeps better. She reported that she does not have any worsening of her depressive symptoms and she is staying home most of the time. She is compliant with her medications she denied having any suicidal or homicidal ideations or plans. No other acute symptoms noted.  Plan I will refill her medications.  Follow up in two months earlier depending on her symptoms  I discussed the assessment and treatment plan with the patient. The patient was provided an opportunity to ask questions and all were answered. The patient agreed with the plan and demonstrated an understanding of the instructions.   The patient was advised to call back or seek an in-person evaluation if the symptoms worsen or if the condition fails to improve as anticipated.   I provided 15 minutes of non-face-to-face time during this encounter.

## 2019-06-04 DIAGNOSIS — E119 Type 2 diabetes mellitus without complications: Secondary | ICD-10-CM | POA: Diagnosis not present

## 2019-06-04 DIAGNOSIS — G8929 Other chronic pain: Secondary | ICD-10-CM | POA: Diagnosis not present

## 2019-06-04 DIAGNOSIS — M7061 Trochanteric bursitis, right hip: Secondary | ICD-10-CM | POA: Diagnosis not present

## 2019-06-04 DIAGNOSIS — M25551 Pain in right hip: Secondary | ICD-10-CM | POA: Diagnosis not present

## 2019-06-24 DIAGNOSIS — R0600 Dyspnea, unspecified: Secondary | ICD-10-CM | POA: Diagnosis not present

## 2019-06-24 DIAGNOSIS — Z9981 Dependence on supplemental oxygen: Secondary | ICD-10-CM | POA: Diagnosis not present

## 2019-06-24 DIAGNOSIS — R590 Localized enlarged lymph nodes: Secondary | ICD-10-CM | POA: Diagnosis not present

## 2019-06-24 DIAGNOSIS — J449 Chronic obstructive pulmonary disease, unspecified: Secondary | ICD-10-CM | POA: Diagnosis not present

## 2019-06-24 DIAGNOSIS — R911 Solitary pulmonary nodule: Secondary | ICD-10-CM | POA: Diagnosis not present

## 2019-06-24 DIAGNOSIS — R918 Other nonspecific abnormal finding of lung field: Secondary | ICD-10-CM | POA: Diagnosis not present

## 2019-06-25 ENCOUNTER — Other Ambulatory Visit: Payer: Self-pay | Admitting: Specialist

## 2019-06-25 DIAGNOSIS — R590 Localized enlarged lymph nodes: Secondary | ICD-10-CM

## 2019-06-25 DIAGNOSIS — R918 Other nonspecific abnormal finding of lung field: Secondary | ICD-10-CM

## 2019-06-25 DIAGNOSIS — R911 Solitary pulmonary nodule: Secondary | ICD-10-CM

## 2019-06-25 DIAGNOSIS — R0609 Other forms of dyspnea: Secondary | ICD-10-CM

## 2019-06-25 DIAGNOSIS — J449 Chronic obstructive pulmonary disease, unspecified: Secondary | ICD-10-CM

## 2019-06-29 ENCOUNTER — Other Ambulatory Visit: Payer: Self-pay

## 2019-06-29 ENCOUNTER — Ambulatory Visit: Payer: Medicare HMO | Admitting: Psychiatry

## 2019-06-30 ENCOUNTER — Other Ambulatory Visit: Payer: Self-pay

## 2019-07-01 ENCOUNTER — Ambulatory Visit
Admission: RE | Admit: 2019-07-01 | Discharge: 2019-07-01 | Disposition: A | Discharge status: Home / Self Care | Payer: Medicare HMO | Source: Ambulatory Visit | Attending: Radiation Oncology | Admitting: Radiation Oncology

## 2019-07-01 ENCOUNTER — Other Ambulatory Visit: Payer: Self-pay | Admitting: *Deleted

## 2019-07-01 ENCOUNTER — Other Ambulatory Visit: Payer: Self-pay

## 2019-07-01 ENCOUNTER — Encounter: Payer: Self-pay | Admitting: Radiation Oncology

## 2019-07-01 VITALS — BP 140/72 | HR 84 | Temp 98.6°F | Resp 16 | Wt 204.8 lb

## 2019-07-01 DIAGNOSIS — K219 Gastro-esophageal reflux disease without esophagitis: Secondary | ICD-10-CM | POA: Diagnosis not present

## 2019-07-01 DIAGNOSIS — R05 Cough: Secondary | ICD-10-CM | POA: Insufficient documentation

## 2019-07-01 DIAGNOSIS — F418 Other specified anxiety disorders: Secondary | ICD-10-CM | POA: Insufficient documentation

## 2019-07-01 DIAGNOSIS — E119 Type 2 diabetes mellitus without complications: Secondary | ICD-10-CM | POA: Insufficient documentation

## 2019-07-01 DIAGNOSIS — R918 Other nonspecific abnormal finding of lung field: Secondary | ICD-10-CM

## 2019-07-01 DIAGNOSIS — I10 Essential (primary) hypertension: Secondary | ICD-10-CM | POA: Diagnosis not present

## 2019-07-01 DIAGNOSIS — R011 Cardiac murmur, unspecified: Secondary | ICD-10-CM | POA: Insufficient documentation

## 2019-07-01 DIAGNOSIS — Z801 Family history of malignant neoplasm of trachea, bronchus and lung: Secondary | ICD-10-CM | POA: Diagnosis not present

## 2019-07-01 DIAGNOSIS — I2510 Atherosclerotic heart disease of native coronary artery without angina pectoris: Secondary | ICD-10-CM | POA: Diagnosis not present

## 2019-07-01 DIAGNOSIS — R042 Hemoptysis: Secondary | ICD-10-CM | POA: Diagnosis not present

## 2019-07-01 DIAGNOSIS — Z809 Family history of malignant neoplasm, unspecified: Secondary | ICD-10-CM | POA: Diagnosis not present

## 2019-07-01 DIAGNOSIS — R599 Enlarged lymph nodes, unspecified: Secondary | ICD-10-CM | POA: Diagnosis not present

## 2019-07-01 DIAGNOSIS — F17210 Nicotine dependence, cigarettes, uncomplicated: Secondary | ICD-10-CM | POA: Insufficient documentation

## 2019-07-01 DIAGNOSIS — Z803 Family history of malignant neoplasm of breast: Secondary | ICD-10-CM | POA: Diagnosis not present

## 2019-07-01 DIAGNOSIS — G629 Polyneuropathy, unspecified: Secondary | ICD-10-CM | POA: Insufficient documentation

## 2019-07-01 DIAGNOSIS — Z79899 Other long term (current) drug therapy: Secondary | ICD-10-CM | POA: Insufficient documentation

## 2019-07-01 DIAGNOSIS — J449 Chronic obstructive pulmonary disease, unspecified: Secondary | ICD-10-CM | POA: Insufficient documentation

## 2019-07-01 NOTE — Consult Note (Signed)
NEW PATIENT EVALUATION  Name: Suzanne Glenn  MRN: [id number]  Date:   07/01/2019     DOB: 11/28/1950   This 68 y.o. 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  REFERRING PHYSICIAN: Tracie Harrier, MD  CHIEF COMPLAINT:  Chief Complaint  Patient presents with  . Lung Cancer    Initial consultation    DIAGNOSIS: The encounter diagnosis was Lung mass.   PREVIOUS INVESTIGATIONS:  CT scans reviewed Clinical notes reviewed Pathology report reviewed  HPI: Patient is a 68 year old female with significant COPD emphysema who is been followed for a left upper lobe lung mass which is been progressive in size.  Most recent CT scan shows 4.9 x 5.3 cm lesion abutting the chest wall and possible chest wall invasion.  There are also subcarinal lymph nodes measuring approximately 2 cm which have been stable over time.  She did have a bronchoscopy with multiple biopsies all negative for malignancy.  She did have a slight cough and trace hemoptysis.  She otherwise specifically denies bone pain.  She is scheduled for a PET CT scan next week.  She has not had a medical oncology consultation.  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  PLANNED TREATMENT REGIMEN: CT-guided chest biopsy ordered, PET CT scan ordered, medical oncology consultation ordered  PAST MEDICAL HISTORY:  has a past medical history of Anxiety, Asthma, Cancer ([place]) (12/2018), COPD (chronic obstructive pulmonary disease) ([place]), Coronary artery disease, Depression, Diabetes mellitus, type II ([place]), GERD (gastroesophageal reflux disease), Headache, Heart murmur, Hypertension, Neuropathy, and Restless leg.    PAST SURGICAL HISTORY:  Past Surgical History:  Procedure Laterality Date   . BACK SURGERY  2005   surgery x 2, disc fused in neck, pinched nerve in center of back and neck  . CARDIAC CATHETERIZATION  2015   1 stent placed for blockage  . cervical bone infusion    . CHOLECYSTECTOMY    . THORACIC DISC SURGERY    . TUBAL LIGATION    . VIDEO BRONCHOSCOPY WITH ENDOBRONCHIAL ULTRASOUND N/A 03/06/2019   Procedure: VIDEO BRONCHOSCOPY WITH ENDOBRONCHIAL ULTRASOUND - DIABETIC;  Surgeon: Ottie Glazier, MD;  Location: [facility] ORS;  Service: Thoracic;  Laterality: N/A;    FAMILY HISTORY: family history includes Anxiety disorder in her mother; Bipolar disorder in her sister; Breast cancer in her maternal aunt; COPD in her sister; Cancer in her brother, brother, brother, sister, sister, sister, and sister; Cancer - Lung in her mother and sister; Depression in her brother and sister; Diabetes in her sister and sister; Heart Problems in her brother and sister; Hyperlipidemia in her sister; Hypertension in her sister.  SOCIAL HISTORY:  reports that she has been smoking cigarettes. She started smoking about 44 years ago. She has a 40.00 pack-year smoking history. She has never used smokeless tobacco. She reports that she does not drink alcohol or use drugs.  ALLERGIES: Diclofenac-misoprostol, Nsaids, Zolpidem, Hydrocodone-acetaminophen, and Simvastatin  MEDICATIONS:  Current Outpatient Medications  Medication Sig Dispense Refill  . ACCU-CHEK FASTCLIX LANCETS MISC     . ACCU-CHEK SMARTVIEW test strip     . albuterol (PROVENTIL HFA) 108 (90 BASE) MCG/ACT inhaler Inhale 2 puffs into the lungs every 4 (four) hours as needed for wheezing or shortness of breath.     Marland Kitchen albuterol (PROVENTIL) (2.5  MG/3ML) 0.083% nebulizer solution Take 2.5 mg by nebulization every 6 (six) hours as needed for wheezing or shortness of breath.    Marland Kitchen amLODipine (NORVASC) 10 MG tablet Take 10 mg by mouth daily.     Marland Kitchen aspirin 81 MG chewable tablet Chew 1 tablet (81 mg total) by mouth daily. Continue holding now, as  you are doing until procedure. (Patient taking differently: Chew 81 mg by mouth daily. ) 30 tablet 0  . diclofenac sodium (VOLTAREN) 1 % GEL     . esomeprazole (NEXIUM) 40 MG capsule Take 40 mg by mouth daily.    . ferrous sulfate 325 (65 FE) MG tablet Take 1 tablet (325 mg total) by mouth 2 (two) times daily with a meal. 30 tablet 3  . fluticasone (FLONASE) 50 MCG/ACT nasal spray Place 2 sprays into both nostrils daily as needed for allergies.     . Fluticasone-Umeclidin-Vilant (TRELEGY ELLIPTA IN) Inhale into the lungs daily.    . Fluticasone-Umeclidin-Vilant 100-62.5-25 MCG/INH AEPB Inhale 2 puffs into the lungs daily. Treligy    . gabapentin (NEURONTIN) 100 MG capsule Take 1 capsule (100 mg total) by mouth 2 (two) times daily. QAM, QNOON 180 capsule 2  . gabapentin (NEURONTIN) 400 MG capsule Take 1 capsule (400 mg total) by mouth 2 (two) times daily. 400 mg around 1300, and 2000 180 capsule 2  . metFORMIN (GLUCOPHAGE) 500 MG tablet Take 500 mg by mouth daily with breakfast.     . metoprolol tartrate (LOPRESSOR) 25 MG tablet Take 25 mg by mouth 2 (two) times daily.     . Multiple Vitamin (MULTIVITAMIN) tablet Take 1 tablet by mouth daily.    Marland Kitchen oxybutynin (DITROPAN-XL) 5 MG 24 hr tablet     . OXYGEN Inhale 2 L into the lungs continuous.    . potassium chloride (KLOR-CON) 8 MEQ tablet Take 8 mEq by mouth daily.     . pravastatin (PRAVACHOL) 80 MG tablet Take 80 mg by mouth every evening.     Marland Kitchen rOPINIRole (REQUIP) 0.25 MG tablet Take 0.5 mg by mouth at bedtime.     Marland Kitchen spironolactone (ALDACTONE) 50 MG tablet Take 50 mg by mouth daily.     Marland Kitchen venlafaxine XR (EFFEXOR-XR) 150 MG 24 hr capsule Take 1 capsule (150 mg total) by mouth daily with breakfast. 90 capsule 1  . ipratropium (ATROVENT) 0.02 % nebulizer solution Take 2.5 mLs (0.5 mg total) by nebulization every 6 (six) hours as needed for wheezing or shortness of breath. (Patient not taking: Reported on 02/24/2019) 75 mL 12   No current  facility-administered medications for this encounter.     ECOG PERFORMANCE STATUS:  1 - Symptomatic but completely ambulatory  REVIEW OF SYSTEMS: Except for the trace hemoptysis and severe COPD Patient denies any weight loss, fatigue, weakness, fever, chills or night sweats. Patient denies any loss of vision, blurred vision. Patient denies any ringing  of the ears or hearing loss. No irregular heartbeat. Patient denies heart murmur or history of fainting. Patient denies any chest pain or pain radiating to her upper extremities. Patient denies any shortness of breath, difficulty breathing at night, cough or hemoptysis. Patient denies any swelling in the lower legs. Patient denies any nausea vomiting, vomiting of blood, or coffee ground material in the vomitus. Patient denies any stomach pain. Patient states has had normal bowel movements no significant constipation or diarrhea. Patient denies any dysuria, hematuria or significant nocturia. Patient denies any problems walking, swelling in the joints or loss  of balance. Patient denies any skin changes, loss of hair or loss of weight. Patient denies any excessive worrying or anxiety or significant depression. Patient denies any problems with insomnia. Patient denies excessive thirst, polyuria, polydipsia. Patient denies any swollen glands, patient denies easy bruising or easy bleeding. Patient denies any recent infections, allergies or URI. Patient "s visual fields have not changed significantly in recent time.   PHYSICAL EXAM: BP 140/72 (BP Location: Left Arm, Patient Position: Sitting)   Pulse 84   Temp 98.6 F (37 C) (Tympanic)   Resp 16   Wt 204 lb 12.8 oz (92.9 kg)   BMI 36.28 kg/m  Well-developed oxygen dependent female in NAD.  Well-developed well-nourished patient in NAD. HEENT reveals PERLA, EOMI, discs not visualized.  Oral cavity is clear. No oral mucosal lesions are identified. Neck is clear without evidence of cervical or supraclavicular  adenopathy. Lungs are clear to A&P. Cardiac examination is essentially unremarkable with regular rate and rhythm without murmur rub or thrill. Abdomen is benign with no organomegaly or masses noted. Motor sensory and DTR levels are equal and symmetric in the upper and lower extremities. Cranial nerves II through XII are grossly intact. Proprioception is intact. No peripheral adenopathy or edema is identified. No motor or sensory levels are noted. Crude visual fields are within normal range.  LABORATORY DATA: Negative cytology from previous bronchoscopy reviewed CT-guided biopsy of the left upper lobe mass ordered    RADIOLOGY RESULTS: Serial CT scans reviewed PET CT scan ordered   IMPRESSION: Probable primary bronchogenic lung cancer in 68 year old female as yet yet non-biopsied  PLAN: At this time I have ordered a CT-guided biopsy after her PET CT scan is been performed next week.  Of also ordered a medical oncology consultation she will be seeing Dr. Grayland Ormond this week.  We will await tissue diagnosis for treatment planning and I have set up a 2-week follow-up appointment with the patient.  I have explained to her the possibility of both chemotherapy and radiation therapy.  With her severe COPD and probable at least stage IIIa disease do not think she is a surgical candidate.  I await medical oncology's input.  Patient comprehends my treatment plan well and rationale for all further tests.  I would like to take this opportunity to thank you for allowing me to participate in the care of your patient.Noreene Filbert, MD

## 2019-07-02 NOTE — Progress Notes (Signed)
Suzanne Glenn  [phone] [phone]  ID: Suzanne Glenn OB: 03-31-1951  MR#: [id number]  HYI#:[id number]  Patient Care Team: Tracie Harrier, MD as PCP - General (Internal Medicine)  CHIEF COMPLAINT: Mass of the upper lobe of left lung.  INTERVAL HISTORY: Patient is a 68 year old female who was noted to have a suspicious mass on CT scan, but subsequent biopsies were inconclusive for malignancy.  Repeat CT scan revealed the mass had enlarged and patient was sent for evaluation and treatment.  She is anxious, but otherwise feels well.  She has chronic shortness of breath and requires oxygen 24 hours a day.  She has no neurologic complaints.  She denies any recent fevers or illnesses.  She has a good appetite and denies weight loss.  She denies any pain.  She has no chest pain, cough, or hemoptysis.  She denies any nausea, vomiting, constipation, or diarrhea.  She has no urinary complaints.  Patient otherwise feels well and offers no further specific complaints today.  REVIEW OF SYSTEMS:   Review of Systems  Constitutional: Negative.  Negative for fever, malaise/fatigue and weight loss.  Respiratory: Positive for shortness of breath. Negative for cough and hemoptysis.   Cardiovascular: Negative.  Negative for chest pain and leg swelling.  Gastrointestinal: Negative.  Negative for abdominal pain.  Genitourinary: Negative.  Negative for dysuria.  Musculoskeletal: Negative.  Negative for back pain.  Skin: Negative.  Negative for rash.  Neurological: Negative.  Negative for dizziness, weakness and headaches.  Psychiatric/Behavioral: The patient is nervous/anxious.     As per HPI. Otherwise, a complete review of systems is negative.  PAST MEDICAL HISTORY: Past Medical History:  Diagnosis Date   Anxiety    Asthma    Cancer ([place]) 12/2018   w/u for right upper lobe mass/cancer   COPD (chronic obstructive pulmonary disease) ([facility])    also,  emphysema. now using o2 via np 24 hours a day   Coronary artery disease    Depression    Diabetes mellitus, type II ([facility])    GERD (gastroesophageal reflux disease)    Headache    migraines in early 20's   Heart murmur    Hypertension    Lung cancer ([place])    Neuropathy    Restless leg     PAST SURGICAL HISTORY: Past Surgical History:  Procedure Laterality Date   BACK SURGERY  2005   surgery x 2, disc fused in neck, pinched nerve in center of back and neck   CARDIAC CATHETERIZATION  2015   1 stent placed for blockage   cervical bone infusion     CHOLECYSTECTOMY     THORACIC DISC SURGERY     TUBAL LIGATION     VIDEO BRONCHOSCOPY WITH ENDOBRONCHIAL ULTRASOUND N/A 03/06/2019   Procedure: VIDEO BRONCHOSCOPY WITH ENDOBRONCHIAL ULTRASOUND - DIABETIC;  Surgeon: Ottie Glazier, MD;  Location: [facility] ORS;  Service: Thoracic;  Laterality: N/A;    FAMILY HISTORY: Family History  Problem Relation Age of Onset   Anxiety disorder Mother    Cancer - Lung Mother    Depression Sister    Cancer Sister    Depression Brother    Cancer Sister    Cancer Sister    Cancer Sister    Heart Problems Sister    Bipolar disorder Sister    Diabetes Sister    Cancer - Lung Sister    Hypertension Sister    Diabetes Sister    Hyperlipidemia Sister    COPD Sister  Heart Problems Brother    Cancer Brother    Cancer Brother    Cancer Brother    Breast cancer Maternal Aunt     ADVANCED DIRECTIVES (Y/N):  N  HEALTH MAINTENANCE: Social History   Tobacco Use   Smoking status: Current Every Day Smoker    Packs/day: 1.00    Years: 40.00    Pack years: 40.00    Types: Cigarettes    Start date: 05/05/1975   Smokeless tobacco: Never Used  Substance Use Topics   Alcohol use: No    Alcohol/week: 0.0 standard drinks    Comment: socially   Drug use: No     Colonoscopy:  PAP:  Bone density:  Lipid panel:  Allergies  Allergen Reactions    Diclofenac-Misoprostol Other (See Comments)    Arthrotec - gastritis    Nsaids Other (See Comments)    gastritis   Zolpidem Other (See Comments)    Sleep walking    Hydrocodone-Acetaminophen Nausea And Vomiting   Simvastatin Other (See Comments)    body aches    Current Outpatient Medications  Medication Sig Dispense Refill   ACCU-CHEK FASTCLIX LANCETS MISC      ACCU-CHEK SMARTVIEW test strip      albuterol (PROVENTIL HFA) 108 (90 BASE) MCG/ACT inhaler Inhale 2 puffs into the lungs every 4 (four) hours as needed for wheezing or shortness of breath.      albuterol (PROVENTIL) (2.5 MG/3ML) 0.083% nebulizer solution Take 2.5 mg by nebulization every 6 (six) hours as needed for wheezing or shortness of breath.     amLODipine (NORVASC) 10 MG tablet Take 10 mg by mouth daily.      aspirin 81 MG chewable tablet Chew 1 tablet (81 mg total) by mouth daily. Continue holding now, as you are doing until procedure. (Patient taking differently: Chew 81 mg by mouth daily. ) 30 tablet 0   diclofenac sodium (VOLTAREN) 1 % GEL      esomeprazole (NEXIUM) 40 MG capsule Take 40 mg by mouth daily.     ferrous sulfate 325 (65 FE) MG tablet Take 1 tablet (325 mg total) by mouth 2 (two) times daily with a meal. 30 tablet 3   fluticasone (FLONASE) 50 MCG/ACT nasal spray Place 2 sprays into both nostrils daily as needed for allergies.      Fluticasone-Umeclidin-Vilant (TRELEGY ELLIPTA IN) Inhale into the lungs daily.     Fluticasone-Umeclidin-Vilant 100-62.5-25 MCG/INH AEPB Inhale 2 puffs into the lungs daily. Treligy     gabapentin (NEURONTIN) 100 MG capsule Take 1 capsule (100 mg total) by mouth 2 (two) times daily. QAM, QNOON 180 capsule 2   gabapentin (NEURONTIN) 400 MG capsule Take 1 capsule (400 mg total) by mouth 2 (two) times daily. 400 mg around 1300, and 2000 180 capsule 2   ipratropium (ATROVENT) 0.02 % nebulizer solution Take 2.5 mLs (0.5 mg total) by nebulization every 6 (six) hours  as needed for wheezing or shortness of breath. 75 mL 12   metFORMIN (GLUCOPHAGE) 500 MG tablet Take 500 mg by mouth daily with breakfast.      metoprolol tartrate (LOPRESSOR) 25 MG tablet Take 25 mg by mouth 2 (two) times daily.      Multiple Vitamin (MULTIVITAMIN) tablet Take 1 tablet by mouth daily.     oxybutynin (DITROPAN-XL) 5 MG 24 hr tablet      OXYGEN Inhale 2 L into the lungs continuous.     potassium chloride (KLOR-CON) 8 MEQ tablet Take 8 mEq by mouth  daily.      pravastatin (PRAVACHOL) 80 MG tablet Take 80 mg by mouth every evening.      rOPINIRole (REQUIP) 0.25 MG tablet Take 0.5 mg by mouth at bedtime.      spironolactone (ALDACTONE) 50 MG tablet Take 50 mg by mouth daily.      venlafaxine XR (EFFEXOR-XR) 150 MG 24 hr capsule Take 1 capsule (150 mg total) by mouth daily with breakfast. 90 capsule 1   No current facility-administered medications for this visit.     OBJECTIVE: Vitals:   07/09/19 1501  BP: 138/80  Pulse: 87  Resp: 20  Temp: 99.5 F (37.5 C)     Body mass index is 36.31 kg/m.    ECOG FS:0 - Asymptomatic  General: Well-developed, well-nourished, no acute distress. Eyes: Pink conjunctiva, anicteric sclera. HEENT: Normocephalic, moist mucous membranes, clear oropharnyx. Lungs: Clear to auscultation bilaterally. Heart: Regular rate and rhythm. No rubs, murmurs, or gallops. Abdomen: Soft, nontender, nondistended. No organomegaly noted, normoactive bowel sounds. Musculoskeletal: No edema, cyanosis, or clubbing. Neuro: Alert, answering all questions appropriately. Cranial nerves grossly intact. Skin: No rashes or petechiae noted. Psych: Normal affect. Lymphatics: No cervical, calvicular, axillary or inguinal LAD.   LAB RESULTS:  Lab Results  Component Value Date   NA 138 01/02/2019   K 3.7 01/02/2019   CL 97 (L) 01/02/2019   CO2 32 01/02/2019   GLUCOSE 87 01/02/2019   BUN 16 01/02/2019   CREATININE 0.70 01/02/2019   CALCIUM 9.3  01/02/2019   PROT 7.0 01/01/2019   ALBUMIN 3.9 01/01/2019   AST 21 01/01/2019   ALT 13 01/01/2019   ALKPHOS 71 01/01/2019   BILITOT 0.3 01/01/2019   GFRNONAA >60 01/02/2019   GFRAA >60 01/02/2019    Lab Results  Component Value Date   WBC 8.9 03/03/2019   NEUTROABS 6.2 03/03/2019   HGB 12.7 03/03/2019   HCT 43.5 03/03/2019   MCV 92.4 03/03/2019   PLT 326 03/03/2019     STUDIES: Nm Pet Image Initial (pi) Skull Base To Thigh  Result Date: 07/07/2019 CLINICAL DATA:  Initial treatment strategy for left upper lobe lung mass. EXAM: NUCLEAR MEDICINE PET SKULL BASE TO THIGH TECHNIQUE: 10.0 mCi F-18 FDG was injected intravenously. Full-ring PET imaging was performed from the skull base to thigh after the radiotracer. CT data was obtained and used for attenuation correction and anatomic localization. Fasting blood glucose: 129 mg/dl COMPARISON:  Multiple exams, including 04/09/2019 FINDINGS: Mediastinal blood pool activity: SUV max 3.1 Liver activity: SUV max 4.1 NECK: Symmetric palatine tonsillar activity. Incidental CT findings: Periventricular white matter hypodensity favoring chronic ischemic microvascular white matter disease. Bilateral common carotid atherosclerotic calcification. CHEST: The 7.1 by 5.6 cm (measurement image: Image 105/3) lingular mass abuts both pleural and mediastinal margins and has a maximum SUV of 22.8. Central reduced activity compatible with a small amount of central necrosis. No definite chest wall invasion or definite mediastinal invasion; no pleural effusion or pericardial effusion. Subcarinal lymph node 0.9 cm in short axis, maximum SUV 2.9 (less than blood pool). Incidental CT findings: Mild lingular atelectasis below the mass. Coronary, aortic arch, and branch vessel atherosclerotic vascular disease. Airway thickening is present, suggesting bronchitis or reactive airways disease. There is airway plugging in the lower lobes. Mild peripheral atelectasis in the left  lower lobe. ABDOMEN/PELVIS: Physiologic activity in bowel. Mildly accentuated activity in the vicinity of the anus, maximum SUV 7.7. Most commonly this is physiologic, but strictly speaking non-specific. No CT correlate. Incidental CT  findings: Cholecystectomy. Aortoiliac atherosclerotic vascular disease. Prior bilateral tubal ligation. SKELETON: No significant abnormal hypermetabolic activity in this region. Incidental CT findings: Lower cervical plate and screw fixator. IMPRESSION: 1. 7.1 cm lingular mass with maximum SUV 22.8, compatible with malignancy. 2. Normal sized subcarinal lymph node, not hypermetabolic. 3. No distant metastatic lesions are identified. 4. Mildly accentuated activity in the vicinity of the anus, maximum SUV 7.7, without CT correlate. Most commonly this type of activity is physiologic, correlate with colon screening history in determining whether any further workup such as digital rectal exam is warranted. 5. Airway thickening is present, suggesting bronchitis or reactive airways disease. Airway plugging in the lower lobes. 6. 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 Aortic Atherosclerosis (ICD10-I70.0). Coronary atherosclerosis. Electronically Signed   By: Van Clines M.D.   On: 07/07/2019 14:47    ASSESSMENT: Mass of the upper lobe of left lung.  PLAN:    1. Mass of the upper lobe of left lung: Highly suspicious for underlying malignancy.  Patient has a biopsy scheduled for July 16, 2019.  PET scan results from July 07, 2019 reviewed independently and reported as above revealing a 7.1 cm lingular mass with SUV of 22.8.  She has no obvious mediastinal or hilar lymph nodes on PET scan.  Using size criteria on CT scan, patient is a stage IIb, size measured on PET scan increases staging to a IIIa.  Will get MRI of the brain to complete the staging work-up.  Patient will return to clinic in  [redacted] weeks along with consultation with radiation oncology to discuss her biopsy results and treatment planning.  Patient would definitely benefit from concurrent carboplatinum and Taxol along with daily XRT.  Depending on the final agreed-upon stage, she may or may not qualify for year-long maintenance immunotherapy.  I spent a total of 60 minutes face-to-face with the patient of which greater than 50% of the visit was spent in counseling and coordination of care as detailed above.  Patient expressed understanding and was in agreement with this plan. She also understands that She can call clinic at any time with any questions, concerns, or complaints.   Cancer Staging No matching staging information was found for the patient.  Lloyd Huger, MD   07/09/2019 4:06 PM

## 2019-07-06 ENCOUNTER — Other Ambulatory Visit: Payer: Self-pay

## 2019-07-06 ENCOUNTER — Encounter: Payer: Self-pay | Admitting: Psychiatry

## 2019-07-06 ENCOUNTER — Ambulatory Visit (INDEPENDENT_AMBULATORY_CARE_PROVIDER_SITE_OTHER): Payer: Medicare HMO | Admitting: Psychiatry

## 2019-07-06 ENCOUNTER — Ambulatory Visit: Payer: Medicare HMO | Admitting: Oncology

## 2019-07-06 DIAGNOSIS — F331 Major depressive disorder, recurrent, moderate: Secondary | ICD-10-CM | POA: Diagnosis not present

## 2019-07-06 DIAGNOSIS — F411 Generalized anxiety disorder: Secondary | ICD-10-CM

## 2019-07-06 MED ORDER — VENLAFAXINE HCL ER 150 MG PO CP24: 150.0000 mg | ORAL_CAPSULE | Freq: Every day | ORAL | 1 refills | Status: DC

## 2019-07-06 MED ORDER — GABAPENTIN 100 MG PO CAPS: 100.0000 mg | ORAL_CAPSULE | Freq: Two times a day (BID) | ORAL | 2 refills | Status: DC

## 2019-07-06 MED ORDER — GABAPENTIN 400 MG PO CAPS: 400.0000 mg | ORAL_CAPSULE | Freq: Two times a day (BID) | ORAL | 2 refills | Status: DC

## 2019-07-06 NOTE — Progress Notes (Signed)
Patient ID: Suzanne Glenn, female   DOB: 04/01/1951, 68 y.o.   MRN: [id number]   Patient is a 68 year old female with history of Depression anxiety followed for medication management. She reported that she continues to have chronic pain in her legs and has been feeling restless. She has increase the dose of gabapentin and takes 200 mg in the morning and 400 mg at noon  and at night. She also reported that  she was recently diagnosed with lung mass and cance and is going for pet  scan tomorrow. She reported that she will start with radiation therapy as discuss with her physician. Patient reported that she was having symptoms earlier in the year  and had the Biopsy done at that time.  Patient appeared anxious about her new diagnosis but is motivated to complete her treatment. She reported that she has been taking Requip to help with her Restless legs as well. She has been following with her appointments and is compliant with her medications. She denied having any suicidal ideations or plans.   Plan I will continue her medications as prescribed. She will make a follow-up appointment in three months earlier as she is scheduled for her treatment for lung mass at this time.   I connected with patient via telemedicine application and verified that I am speaking with the correct person using two identifiers.  I discussed the limitations of evaluation and management by telemedicine and the availability of in person appointments. The patient expressed understanding and agreed to proceed.   I discussed the assessment and treatment plan with the patient. The patient was provided an opportunity to ask questions and all were answered. The patient agreed with the plan and demonstrated an understanding of the instructions.   The patient was advised to call back or seek an in-person evaluation if the symptoms worsen or if the condition fails to improve as anticipated.   I provided 15 minutes of non-face-to-face  time during this encounter.

## 2019-07-07 ENCOUNTER — Encounter
Admission: RE | Admit: 2019-07-07 | Discharge: 2019-07-07 | Disposition: A | Discharge status: Home / Self Care | Payer: Medicare HMO | Source: Ambulatory Visit | Attending: Specialist | Admitting: Specialist

## 2019-07-07 DIAGNOSIS — R590 Localized enlarged lymph nodes: Secondary | ICD-10-CM

## 2019-07-07 DIAGNOSIS — R911 Solitary pulmonary nodule: Secondary | ICD-10-CM | POA: Diagnosis not present

## 2019-07-07 DIAGNOSIS — J449 Chronic obstructive pulmonary disease, unspecified: Secondary | ICD-10-CM | POA: Diagnosis not present

## 2019-07-07 DIAGNOSIS — R918 Other nonspecific abnormal finding of lung field: Secondary | ICD-10-CM | POA: Insufficient documentation

## 2019-07-07 DIAGNOSIS — R0609 Other forms of dyspnea: Secondary | ICD-10-CM

## 2019-07-07 LAB — GLUCOSE, CAPILLARY: Glucose-Capillary: 129 mg/dL — ABNORMAL HIGH (ref 70–99)

## 2019-07-07 MED ORDER — FLUDEOXYGLUCOSE F - 18 (FDG) INJECTION
11.0400 | Freq: Once | INTRAVENOUS | Status: AC | PRN
  Administered 2019-07-07: 10:00:00 11.04 via INTRAVENOUS

## 2019-07-08 ENCOUNTER — Other Ambulatory Visit: Payer: Self-pay

## 2019-07-09 ENCOUNTER — Inpatient Hospital Stay: Payer: Medicare HMO | Attending: Oncology | Admitting: Oncology

## 2019-07-09 ENCOUNTER — Other Ambulatory Visit: Payer: Self-pay | Admitting: *Deleted

## 2019-07-09 ENCOUNTER — Encounter: Payer: Self-pay | Admitting: Oncology

## 2019-07-09 ENCOUNTER — Other Ambulatory Visit: Payer: Self-pay

## 2019-07-09 VITALS — BP 138/80 | HR 87 | Temp 99.5°F | Resp 20 | Wt 205.0 lb

## 2019-07-09 DIAGNOSIS — Z720 Tobacco use: Secondary | ICD-10-CM | POA: Diagnosis not present

## 2019-07-09 DIAGNOSIS — Z803 Family history of malignant neoplasm of breast: Secondary | ICD-10-CM | POA: Diagnosis not present

## 2019-07-09 DIAGNOSIS — R918 Other nonspecific abnormal finding of lung field: Secondary | ICD-10-CM | POA: Diagnosis not present

## 2019-07-09 DIAGNOSIS — Z809 Family history of malignant neoplasm, unspecified: Secondary | ICD-10-CM

## 2019-07-09 DIAGNOSIS — Z801 Family history of malignant neoplasm of trachea, bronchus and lung: Secondary | ICD-10-CM | POA: Diagnosis not present

## 2019-07-09 DIAGNOSIS — R0602 Shortness of breath: Secondary | ICD-10-CM

## 2019-07-09 DIAGNOSIS — Z9981 Dependence on supplemental oxygen: Secondary | ICD-10-CM | POA: Diagnosis not present

## 2019-07-13 ENCOUNTER — Other Ambulatory Visit
Admission: RE | Admit: 2019-07-13 | Discharge: 2019-07-13 | Disposition: A | Discharge status: Home / Self Care | Payer: Medicare HMO | Source: Ambulatory Visit | Attending: Radiation Oncology | Admitting: Radiation Oncology

## 2019-07-13 ENCOUNTER — Other Ambulatory Visit: Payer: Self-pay

## 2019-07-13 DIAGNOSIS — Z01812 Encounter for preprocedural laboratory examination: Secondary | ICD-10-CM | POA: Diagnosis not present

## 2019-07-13 DIAGNOSIS — Z20828 Contact with and (suspected) exposure to other viral communicable diseases: Secondary | ICD-10-CM | POA: Insufficient documentation

## 2019-07-13 LAB — SARS CORONAVIRUS 2 (TAT 6-24 HRS): SARS Coronavirus 2: NEGATIVE

## 2019-07-14 NOTE — Progress Notes (Signed)
Noting patient for Lung biopsy on 07/16/2019, spoke with patient over phone with instructions given along with questions answered. Aware to be here at 0900 for 1000 procedure start.

## 2019-07-15 ENCOUNTER — Other Ambulatory Visit: Payer: Self-pay | Admitting: Radiology

## 2019-07-15 ENCOUNTER — Other Ambulatory Visit: Payer: Self-pay | Admitting: Student

## 2019-07-16 ENCOUNTER — Ambulatory Visit
Admission: RE | Admit: 2019-07-16 | Discharge: 2019-07-16 | Disposition: A | Discharge status: Home / Self Care | Payer: Medicare HMO | Source: Ambulatory Visit | Attending: Radiation Oncology | Admitting: Radiation Oncology

## 2019-07-16 ENCOUNTER — Other Ambulatory Visit: Payer: Self-pay

## 2019-07-16 ENCOUNTER — Ambulatory Visit
Admission: RE | Admit: 2019-07-16 | Discharge: 2019-07-16 | Disposition: A | Discharge status: Home / Self Care | Payer: Medicare HMO | Source: Ambulatory Visit | Attending: Interventional Radiology | Admitting: Interventional Radiology

## 2019-07-16 DIAGNOSIS — Z9889 Other specified postprocedural states: Secondary | ICD-10-CM | POA: Diagnosis not present

## 2019-07-16 DIAGNOSIS — C3412 Malignant neoplasm of upper lobe, left bronchus or lung: Secondary | ICD-10-CM | POA: Diagnosis not present

## 2019-07-16 DIAGNOSIS — R918 Other nonspecific abnormal finding of lung field: Secondary | ICD-10-CM | POA: Diagnosis not present

## 2019-07-16 LAB — CBC
HCT: 42.3 % (ref 36.0–46.0)
Hemoglobin: 13.3 g/dL (ref 12.0–15.0)
MCH: 28.4 pg (ref 26.0–34.0)
MCHC: 31.4 g/dL (ref 30.0–36.0)
MCV: 90.2 fL (ref 80.0–100.0)
Platelets: 313 10*3/uL (ref 150–400)
RBC: 4.69 MIL/uL (ref 3.87–5.11)
RDW: 16.2 % — ABNORMAL HIGH (ref 11.5–15.5)
WBC: 7.8 10*3/uL (ref 4.0–10.5)
nRBC: 0 % (ref 0.0–0.2)

## 2019-07-16 LAB — PROTIME-INR
INR: 0.9 (ref 0.8–1.2)
Prothrombin Time: 12 seconds (ref 11.4–15.2)

## 2019-07-16 LAB — GLUCOSE, CAPILLARY: Glucose-Capillary: 114 mg/dL — ABNORMAL HIGH (ref 70–99)

## 2019-07-16 MED ORDER — OXYCODONE-ACETAMINOPHEN 5-325 MG PO TABS
ORAL_TABLET | ORAL | Status: AC
  Filled 2019-07-16: qty 2

## 2019-07-16 MED ORDER — MIDAZOLAM HCL 5 MG/5ML IJ SOLN
INTRAMUSCULAR | Status: AC
  Filled 2019-07-16: qty 5

## 2019-07-16 MED ORDER — FENTANYL CITRATE (PF) 100 MCG/2ML IJ SOLN
INTRAMUSCULAR | Status: AC | PRN
  Administered 2019-07-16 (×2): 25 ug via INTRAVENOUS

## 2019-07-16 MED ORDER — OXYCODONE-ACETAMINOPHEN 5-325 MG PO TABS
2.0000 | ORAL_TABLET | Freq: Once | ORAL | Status: AC
  Administered 2019-07-16: 2 via ORAL
  Filled 2019-07-16: qty 2

## 2019-07-16 MED ORDER — FENTANYL CITRATE (PF) 100 MCG/2ML IJ SOLN
INTRAMUSCULAR | Status: AC
  Filled 2019-07-16: qty 2

## 2019-07-16 MED ORDER — SODIUM CHLORIDE 0.9 % IV SOLN
INTRAVENOUS | Status: DC
  Administered 2019-07-16: 08:00:00 via INTRAVENOUS

## 2019-07-16 MED ORDER — MIDAZOLAM HCL 2 MG/2ML IJ SOLN
INTRAMUSCULAR | Status: AC | PRN
  Administered 2019-07-16 (×2): 0.5 mg via INTRAVENOUS

## 2019-07-16 NOTE — Procedures (Signed)
Pre procedural Dx: Hypermetabolic left upper lobe pulmonary nodule/mass Post procedural Dx: Same  Technically successful CT guided biopsy of indeterminate hypermetabolic left upper lobe pulmonary nodule/mass   EBL: None.   Complications: None immediate.   Ronny Bacon, MD Pager #: [phone]

## 2019-07-16 NOTE — Consult Note (Signed)
Chief Complaint: Hypermetabolic left upper lobe pulmonary mass   Referring Physician(s): Chrystal (Rad Onc) Finnegan (Oncology)  Patient Status: [facility] - Out-pt  History of Present Illness: Suzanne Glenn is a 68 y.o. female with past medical history significant for asthma, COPD (currently on continuous oxygen supplementation at 2 L), CAD, diabetes, heart murmur and hypertension who presents today for CT-guided biopsy of hypermetabolic left upper lobe pulmonary mass following attempted though unsuccessful bronchoscopic biopsy.  The patient is unaccompanied and serves as her own historian.  Patient is currently without complaint.  Specifically, no cough, hematemesis, chest pain, or shortness of breath (beyond her baseline).  No change in appetite or energy level.  No unintentional weight loss or gain.  Past Medical History:  Diagnosis Date   Anxiety    Asthma    Cancer ([place]) 12/2018   w/u for right upper lobe mass/cancer   COPD (chronic obstructive pulmonary disease) (HCC)    also, emphysema. now using o2 via np 24 hours a day   Coronary artery disease    Depression    Diabetes mellitus, type II (HCC)    GERD (gastroesophageal reflux disease)    Headache    migraines in early 20's   Heart murmur    Hypertension    Lung cancer ([place])    Neuropathy    Restless leg     Past Surgical History:  Procedure Laterality Date   BACK SURGERY  2005   surgery x 2, disc fused in neck, pinched nerve in center of back and neck   CARDIAC CATHETERIZATION  2015   1 stent placed for blockage   cervical bone infusion     CHOLECYSTECTOMY     THORACIC DISC SURGERY     TUBAL LIGATION     VIDEO BRONCHOSCOPY WITH ENDOBRONCHIAL ULTRASOUND N/A 03/06/2019   Procedure: VIDEO BRONCHOSCOPY WITH ENDOBRONCHIAL ULTRASOUND - DIABETIC;  Surgeon: Ottie Glazier, MD;  Location: [facility] ORS;  Service: Thoracic;  Laterality: N/A;    Allergies: Diclofenac-misoprostol, Nsaids,  Zolpidem, Hydrocodone-acetaminophen, and Simvastatin  Medications: Prior to Admission medications   Medication Sig Start Date End Date Taking? Authorizing Provider  amLODipine (NORVASC) 10 MG tablet Take 10 mg by mouth daily.    Yes [provider]  aspirin 81 MG chewable tablet Chew 1 tablet (81 mg total) by mouth daily. Continue holding now, as you are doing until procedure. Patient taking differently: Chew 81 mg by mouth daily.  01/02/19  Yes Vaughan Basta, MD  esomeprazole (NEXIUM) 40 MG capsule Take 40 mg by mouth daily.   Yes [provider]  ferrous sulfate 325 (65 FE) MG tablet Take 1 tablet (325 mg total) by mouth 2 (two) times daily with a meal. 01/02/19  Yes Vaughan Basta, MD  Fluticasone-Umeclidin-Vilant (TRELEGY ELLIPTA IN) Inhale into the lungs daily.   Yes [provider]  Fluticasone-Umeclidin-Vilant 100-62.5-25 MCG/INH AEPB Inhale 1 puff into the lungs daily. Treligy 11/28/18  Yes [provider]  gabapentin (NEURONTIN) 100 MG capsule Take 1 capsule (100 mg total) by mouth 2 (two) times daily. QAM, QNOON Patient taking differently: Take 100 mg by mouth 2 (two) times daily. QAM 07/06/19  Yes Rainey Pines, MD  gabapentin (NEURONTIN) 400 MG capsule Take 1 capsule (400 mg total) by mouth 2 (two) times daily. 400 mg around 1300, and 2000 07/06/19  Yes Rainey Pines, MD  metFORMIN (GLUCOPHAGE) 500 MG tablet Take 500 mg by mouth daily with breakfast.    Yes [provider]  metoprolol tartrate (  LOPRESSOR) 25 MG tablet Take 25 mg by mouth 2 (two) times daily.    Yes [provider]  oxybutynin (DITROPAN-XL) 5 MG 24 hr tablet Take 5 mg by mouth daily.  06/16/19  Yes [provider]  potassium chloride (KLOR-CON) 8 MEQ tablet Take 8 mEq by mouth daily.    Yes [provider]  pravastatin (PRAVACHOL) 80 MG tablet Take 80 mg by mouth every evening.    Yes [provider]  rOPINIRole (REQUIP) 0.25 MG  tablet Take 0.5 mg by mouth at bedtime.  06/30/18  Yes [provider]  spironolactone (ALDACTONE) 50 MG tablet Take 50 mg by mouth daily.    Yes [provider]  venlafaxine XR (EFFEXOR-XR) 150 MG 24 hr capsule Take 1 capsule (150 mg total) by mouth daily with breakfast. 07/06/19  Yes Rainey Pines, MD  ACCU-CHEK FASTCLIX LANCETS MISC  02/18/15   [provider]  ACCU-CHEK SMARTVIEW test strip  02/18/15   [provider]  albuterol (PROVENTIL HFA) 108 (90 BASE) MCG/ACT inhaler Inhale 2 puffs into the lungs every 4 (four) hours as needed for wheezing or shortness of breath.  03/22/14   [provider]  albuterol (PROVENTIL) (2.5 MG/3ML) 0.083% nebulizer solution Take 2.5 mg by nebulization every 6 (six) hours as needed for wheezing or shortness of breath.    [provider]  diclofenac sodium (VOLTAREN) 1 % GEL  06/04/19   [provider]  fluticasone (FLONASE) 50 MCG/ACT nasal spray Place 2 sprays into both nostrils daily as needed for allergies.  12/30/18   [provider]  ipratropium (ATROVENT) 0.02 % nebulizer solution Take 2.5 mLs (0.5 mg total) by nebulization every 6 (six) hours as needed for wheezing or shortness of breath. 02/26/17   Fritzi Mandes, MD  Multiple Vitamin (MULTIVITAMIN) tablet Take 1 tablet by mouth daily.    [provider]  OXYGEN Inhale 2 L into the lungs continuous.    [provider]     Family History  Problem Relation Age of Onset   Anxiety disorder Mother    Cancer - Lung Mother    Depression Sister    Cancer Sister    Depression Brother    Cancer Sister    Cancer Sister    Cancer Sister    Heart Problems Sister    Bipolar disorder Sister    Diabetes Sister    Cancer - Lung Sister    Hypertension Sister    Diabetes Sister    Hyperlipidemia Sister    COPD Sister    Heart Problems Brother    Cancer Brother    Cancer Brother    Cancer Brother    Breast  cancer Maternal Aunt     Social History   Socioeconomic History   Marital status: Married    Spouse name: Abe People   Number of children: Not on file   Years of education: Not on file   Highest education level: Not on file  Occupational History    Comment: disabled  Social Designer, fashion/clothing strain: Not on file   Food insecurity    Worry: Not on file    Inability: Not on file   Transportation needs    Medical: Not on file    Non-medical: Not on file  Tobacco Use   Smoking status: Current Every Day Smoker    Packs/day: 1.00    Years: 40.00    Pack years: 40.00    Types: Cigarettes  Start date: 05/05/1975   Smokeless tobacco: Never Used  Substance and Sexual Activity   Alcohol use: No    Alcohol/week: 0.0 standard drinks    Comment: socially   Drug use: No   Sexual activity: Not Currently  Lifestyle   Physical activity    Days per week: Not on file    Minutes per session: Not on file   Stress: Not on file  Relationships   Social connections    Talks on phone: Not on file    Gets together: Not on file    Attends religious service: Not on file    Active member of club or organization: Not on file    Attends meetings of clubs or organizations: Not on file    Relationship status: Not on file  Other Topics Concern   Not on file  Social History Narrative   Not on file    ECOG Status: 1 - Symptomatic but completely ambulatory  Review of Systems: A 12 point ROS discussed and pertinent positives are indicated in the HPI above.  All other systems are negative.  Review of Systems  Vital Signs: BP 115/74    Temp 98.3 F (36.8 C) (Oral)    Resp (!) 22    Ht 5\' 3"  (1.6 m)    Wt 93 kg    SpO2 97%    BMI 36.31 kg/m   Physical Exam  Imaging: Nm Pet Image Initial (pi) Skull Base To Thigh  Result Date: 07/07/2019 CLINICAL DATA:  Initial treatment strategy for left upper lobe lung mass. EXAM: NUCLEAR MEDICINE PET SKULL BASE TO THIGH TECHNIQUE:  10.0 mCi F-18 FDG was injected intravenously. Full-ring PET imaging was performed from the skull base to thigh after the radiotracer. CT data was obtained and used for attenuation correction and anatomic localization. Fasting blood glucose: 129 mg/dl COMPARISON:  Multiple exams, including 04/09/2019 FINDINGS: Mediastinal blood pool activity: SUV max 3.1 Liver activity: SUV max 4.1 NECK: Symmetric palatine tonsillar activity. Incidental CT findings: Periventricular white matter hypodensity favoring chronic ischemic microvascular white matter disease. Bilateral common carotid atherosclerotic calcification. CHEST: The 7.1 by 5.6 cm (measurement image: Image 105/3) lingular mass abuts both pleural and mediastinal margins and has a maximum SUV of 22.8. Central reduced activity compatible with a small amount of central necrosis. No definite chest wall invasion or definite mediastinal invasion; no pleural effusion or pericardial effusion. Subcarinal lymph node 0.9 cm in short axis, maximum SUV 2.9 (less than blood pool). Incidental CT findings: Mild lingular atelectasis below the mass. Coronary, aortic arch, and branch vessel atherosclerotic vascular disease. Airway thickening is present, suggesting bronchitis or reactive airways disease. There is airway plugging in the lower lobes. Mild peripheral atelectasis in the left lower lobe. ABDOMEN/PELVIS: Physiologic activity in bowel. Mildly accentuated activity in the vicinity of the anus, maximum SUV 7.7. Most commonly this is physiologic, but strictly speaking non-specific. No CT correlate. Incidental CT findings: Cholecystectomy. Aortoiliac atherosclerotic vascular disease. Prior bilateral tubal ligation. SKELETON: No significant abnormal hypermetabolic activity in this region. Incidental CT findings: Lower cervical plate and screw fixator. IMPRESSION: 1. 7.1 cm lingular mass with maximum SUV 22.8, compatible with malignancy. 2. Normal sized subcarinal lymph node, not  hypermetabolic. 3. No distant metastatic lesions are identified. 4. Mildly accentuated activity in the vicinity of the anus, maximum SUV 7.7, without CT correlate. Most commonly this type of activity is physiologic, correlate with colon screening history in determining whether any further workup such as digital rectal exam is warranted.  5. Airway thickening is present, suggesting bronchitis or reactive airways disease. Airway plugging in the lower lobes. 6. 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 Aortic Atherosclerosis (ICD10-I70.0). Coronary atherosclerosis. Electronically Signed   By: Van Clines M.D.   On: 07/07/2019 14:47    Labs:  CBC: Recent Labs    01/01/19 1106 01/01/19 1618 01/02/19 0514 03/03/19 1304  WBC 8.4 8.6 10.1 8.9  HGB 5.8* 6.0* 8.0* 12.7  HCT 21.9* 22.3* 28.5* 43.5  PLT 414* 430* 423* 326    COAGS: Recent Labs    01/01/19 1106 03/03/19 1304 07/16/19 0725  INR 1.0 0.9 0.9  APTT 26 28  --     BMP: Recent Labs    01/01/19 1618 01/02/19 0514  NA 138 138  K 4.3 3.7  CL 101 97*  CO2 28 32  GLUCOSE 141* 87  BUN 19 16  CALCIUM 9.2 9.3  CREATININE 0.78 0.70  GFRNONAA >60 >60  GFRAA >60 >60    LIVER FUNCTION TESTS: Recent Labs    01/01/19 1618  BILITOT 0.3  AST 21  ALT 13  ALKPHOS 71  PROT 7.0  ALBUMIN 3.9    TUMOR MARKERS: No results for input(s): AFPTM, CEA, CA199, CHROMGRNA in the last 8760 hours.  Assessment and Plan:  Suzanne Glenn is a 68 y.o. female with past medical history significant for asthma, COPD (currently on continuous oxygen supplementation at 2 L), CAD, diabetes, heart murmur and hypertension who presents today for CT-guided biopsy of hypermetabolic left upper lobe pulmonary mass following attempted though unsuccessful bronchoscopic biopsy.   Patient is currently without complaint.   Risks and benefits of CT guided lung nodule  biopsy was discussed with the patient including, but not limited to bleeding, hemoptysis, respiratory failure requiring intubation, infection, pneumothorax requiring chest tube placement, stroke from air embolism or even death.  All of the patient's questions were answered and the patient is agreeable to proceed.  Consent signed and in chart.  Thank you for this interesting consult.  I greatly enjoyed meeting HOLLY IANNACCONE and look forward to participating in their care.  A copy of this report was sent to the requesting provider on this date.  Electronically Signed: Sandi Mariscal, MD 07/16/2019, 8:05 AM   I spent a total of 15 Minutes in face to face in clinical consultation, greater than 50% of which was counseling/coordinating care for CT-guided left upper lobe pulmonary mass biopsy.

## 2019-07-16 NOTE — Discharge Instructions (Signed)
Lung Biopsy, Care After This sheet gives you information about how to care for yourself after your procedure. Your health care provider may also give you more specific instructions depending on the type of biopsy you had. If you have problems or questions, contact your health care provider. What can I expect after the procedure? After the procedure, it is common to have:  A cough.  A sore throat.  Pain where a needle, bronchoscope, or incision was used to collect a biopsy sample (biopsy site).  You may cough up a small amount of bloody sputum for up to several days after your biopsy.  If you cough up more than a TBSP of blood come to the emergency room by EMS.   Follow these instructions at home: Medicines  Take over-the-counter and prescription medicines only as told by your health care provider.  Do not drive for 24 hours if you were given a sedative.  Do not drink alcohol while taking pain medicine.  Do not drive or use heavy machinery while taking prescription pain medicine.  To prevent or treat constipation while you are taking prescription pain medicine, your health care provider may recommend that you: ? Drink enough fluid to keep your urine clear or pale yellow. ? Take over-the-counter or prescription medicines. ? Eat foods that are high in fiber, such as fresh fruits and vegetables, whole grains, and beans. ? Limit foods that are high in fat and processed sugars, such as fried and sweet foods. Activity  If you had an incision during your procedure, avoid activities that may pull the incision site open.  Return to your normal activities tomorrow.   You may shower tomorrow leave band aid on site and pat area dry. Change your band aid after your shower.  You may remove band aid the following day.   Do not scrub or rub your biopsy site or area surrounding it.  If you had an open biopsy:   Follow instructions from your health care provider about how to take care of your  incision. Make sure you: ? Wash your hands with soap and water before you change your bandage (dressing). If soap and water are not available, use hand sanitizer. ? Remove your dressing in 24 hours ? Leave stitches (sutures), skin glue, or adhesive strips in place. These skin closures may need to stay in place for 2 weeks or longer. If adhesive strip edges start to loosen and curl up, you may trim the loose edges. Do not remove adhesive strips completely unless your health care provider tells you to do that.  Check your incision area every day for signs of infection. Check for: ? Redness, swelling, or pain. ? Fluid or blood. ? Warmth. ? Pus or a bad smell. General instructions  It is up to you to get the results of your procedure. Ask your health care provider, or the department that is doing the procedure, when your results will be ready. Contact a health care provider if:  You have a fever.  You have redness, swelling, or pain around your biopsy site.  You have fluid or blood coming from your biopsy site.  Your biopsy site feels warm to the touch.  You have pus or a bad smell coming from your biopsy site. Get help right away if:  You cough up blood.  You have trouble breathing.  You have chest pain. Summary  After the procedure, it is common to have a sore throat and a cough.  Return to  your normal activities as told by your health care provider. Ask your health care provider what activities are safe for you.  Take over-the-counter and prescription medicines only as told by your health care provider.  Report any unusual symptoms to your health care provider. This information is not intended to replace advice given to you by your health care provider. Make sure you discuss any questions you have with your health care provider. Document Released: 10/30/2016 Document Revised: 10/30/2016 Document Reviewed: 10/30/2016 Elsevier Interactive Patient Education  [employer].

## 2019-07-17 LAB — SURGICAL PATHOLOGY

## 2019-07-19 NOTE — Progress Notes (Signed)
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  INTERVAL HISTORY: Patient returns to clinic today to discuss her imaging results and treatment planning. She has chronic shortness of breath and requires oxygen 24 hours a day.  She has no neurologic complaints.  She denies any recent fevers or illnesses.  She has a good appetite and denies weight loss.  She denies any pain.  She has no chest pain, cough, or hemoptysis.  She denies any nausea, vomiting, constipation, or diarrhea.  She has no urinary complaints.  Patient offers no further specific complaints today.  REVIEW OF SYSTEMS:   Review of Systems  Constitutional: Negative.  Negative for fever, malaise/fatigue and weight loss.  Respiratory: Positive for shortness of breath. Negative for cough and hemoptysis.   Cardiovascular: Negative.  Negative for chest pain and leg swelling.  Gastrointestinal: Negative.  Negative for abdominal pain.  Genitourinary: Negative.  Negative for dysuria.  Musculoskeletal: Negative.  Negative for back pain.  Skin: Negative.  Negative for rash.  Neurological: Negative.  Negative for dizziness, weakness and headaches.  Psychiatric/Behavioral: Negative.  The patient is not nervous/anxious.     As per HPI. Otherwise, a complete review of systems is negative.  PAST MEDICAL HISTORY: Past Medical History:  Diagnosis Date   Anxiety    Asthma    Cancer ([place]) 12/2018   w/u for right upper lobe mass/cancer   COPD (chronic obstructive pulmonary disease) (HCC)    also, emphysema. now using o2 via np 24 hours a day   Coronary artery disease    Depression    Diabetes mellitus, type II (HCC)    GERD (gastroesophageal reflux disease)    Headache     migraines in early 20's   Heart murmur    Hypertension    Lung cancer ([place])    Neuropathy    Restless leg     PAST SURGICAL HISTORY: Past Surgical History:  Procedure Laterality Date   BACK SURGERY  2005   surgery x 2, disc fused in neck, pinched nerve in center of back and neck   CARDIAC CATHETERIZATION  2015   1 stent placed for blockage   cervical bone infusion     CHOLECYSTECTOMY     THORACIC DISC SURGERY     TUBAL LIGATION     VIDEO BRONCHOSCOPY WITH ENDOBRONCHIAL ULTRASOUND N/A 03/06/2019   Procedure: VIDEO BRONCHOSCOPY WITH ENDOBRONCHIAL ULTRASOUND - DIABETIC;  Surgeon: Ottie Glazier, MD;  Location: [facility] ORS;  Service: Thoracic;  Laterality: N/A;    FAMILY HISTORY: Family History  Problem Relation Age of Onset   Anxiety disorder Mother    Cancer - Lung Mother    Depression Sister    Cancer Sister    Depression Brother    Cancer Sister    Cancer Sister    Cancer Sister    Heart Problems Sister    Bipolar disorder Sister    Diabetes Sister    Cancer - Lung Sister    Hypertension Sister    Diabetes Sister    Hyperlipidemia Sister    COPD Sister    Heart Problems Brother    Cancer Brother    Cancer Brother    Cancer Brother    Breast cancer Maternal Aunt     ADVANCED  DIRECTIVES (Y/N):  N  HEALTH MAINTENANCE: Social History   Tobacco Use   Smoking status: Current Every Day Smoker    Packs/day: 1.00    Years: 40.00    Pack years: 40.00    Types: Cigarettes    Start date: 05/05/1975   Smokeless tobacco: Never Used  Substance Use Topics   Alcohol use: No    Alcohol/week: 0.0 standard drinks    Comment: socially   Drug use: No     Colonoscopy:  PAP:  Bone density:  Lipid panel:  Allergies  Allergen Reactions   Diclofenac-Misoprostol Other (See Comments)    Arthrotec - gastritis    Nsaids Other (See Comments)    gastritis   Zolpidem Other (See Comments)    Sleep walking     Hydrocodone-Acetaminophen Nausea And Vomiting   Simvastatin Other (See Comments)    body aches    Current Outpatient Medications  Medication Sig Dispense Refill   ACCU-CHEK FASTCLIX LANCETS MISC      ACCU-CHEK SMARTVIEW test strip      albuterol (PROVENTIL HFA) 108 (90 BASE) MCG/ACT inhaler Inhale 2 puffs into the lungs every 4 (four) hours as needed for wheezing or shortness of breath.      albuterol (PROVENTIL) (2.5 MG/3ML) 0.083% nebulizer solution Take 2.5 mg by nebulization every 6 (six) hours as needed for wheezing or shortness of breath.     amLODipine (NORVASC) 10 MG tablet Take 10 mg by mouth daily.      aspirin 81 MG chewable tablet Chew 1 tablet (81 mg total) by mouth daily. Continue holding now, as you are doing until procedure. (Patient taking differently: Chew 81 mg by mouth daily. ) 30 tablet 0   diclofenac sodium (VOLTAREN) 1 % GEL      esomeprazole (NEXIUM) 40 MG capsule Take 40 mg by mouth daily.     ferrous sulfate 325 (65 FE) MG tablet Take 1 tablet (325 mg total) by mouth 2 (two) times daily with a meal. 30 tablet 3   fluticasone (FLONASE) 50 MCG/ACT nasal spray Place 2 sprays into both nostrils daily as needed for allergies.      Fluticasone-Umeclidin-Vilant 100-62.5-25 MCG/INH AEPB Inhale 1 puff into the lungs daily. Treligy     gabapentin (NEURONTIN) 100 MG capsule Take 1 capsule (100 mg total) by mouth 2 (two) times daily. QAM, QNOON (Patient taking differently: Take 200 mg by mouth daily. QAM) 180 capsule 2   gabapentin (NEURONTIN) 400 MG capsule Take 1 capsule (400 mg total) by mouth 2 (two) times daily. 400 mg around 1300, and 2000 180 capsule 2   ipratropium (ATROVENT) 0.02 % nebulizer solution Take 2.5 mLs (0.5 mg total) by nebulization every 6 (six) hours as needed for wheezing or shortness of breath. 75 mL 12   metFORMIN (GLUCOPHAGE) 500 MG tablet Take 500 mg by mouth daily with breakfast.      metoprolol tartrate (LOPRESSOR) 25 MG tablet Take 25  mg by mouth 2 (two) times daily.      Multiple Vitamin (MULTIVITAMIN) tablet Take 1 tablet by mouth daily.     oxybutynin (DITROPAN-XL) 5 MG 24 hr tablet Take 5 mg by mouth daily.      OXYGEN Inhale 2 L into the lungs continuous.     potassium chloride (KLOR-CON) 8 MEQ tablet Take 8 mEq by mouth daily.      pravastatin (PRAVACHOL) 80 MG tablet Take 80 mg by mouth every evening.      rOPINIRole (REQUIP) 0.25 MG  tablet Take 0.5 mg by mouth at bedtime.      spironolactone (ALDACTONE) 50 MG tablet Take 50 mg by mouth daily.      venlafaxine XR (EFFEXOR-XR) 150 MG 24 hr capsule Take 1 capsule (150 mg total) by mouth daily with breakfast. 90 capsule 1   No current facility-administered medications for this visit.     OBJECTIVE: Vitals:   07/23/19 1535  BP: (!) 141/70  Pulse: 85  Temp: 97.8 F (36.6 C)  SpO2: 97%     Body mass index is 35.96 kg/m.    ECOG FS:0 - Asymptomatic  General: Well-developed, well-nourished, no acute distress. Eyes: Pink conjunctiva, anicteric sclera. HEENT: Normocephalic, moist mucous membranes. Lungs: Clear to auscultation bilaterally. Heart: Regular rate and rhythm. No rubs, murmurs, or gallops. Abdomen: Soft, nontender, nondistended. No organomegaly noted, normoactive bowel sounds. Musculoskeletal: No edema, cyanosis, or clubbing. Neuro: Alert, answering all questions appropriately. Cranial nerves grossly intact. Skin: No rashes or petechiae noted. Psych: Normal affect.  LAB RESULTS:  Lab Results  Component Value Date   NA 138 01/02/2019   K 3.7 01/02/2019   CL 97 (L) 01/02/2019   CO2 32 01/02/2019   GLUCOSE 87 01/02/2019   BUN 16 01/02/2019   CREATININE 0.90 07/22/2019   CALCIUM 9.3 01/02/2019   PROT 7.0 01/01/2019   ALBUMIN 3.9 01/01/2019   AST 21 01/01/2019   ALT 13 01/01/2019   ALKPHOS 71 01/01/2019   BILITOT 0.3 01/01/2019   GFRNONAA >60 01/02/2019   GFRAA >60 01/02/2019    Lab Results  Component Value Date   WBC 7.8  07/16/2019   NEUTROABS 6.2 03/03/2019   HGB 13.3 07/16/2019   HCT 42.3 07/16/2019   MCV 90.2 07/16/2019   PLT 313 07/16/2019     STUDIES: Mr Jeri Cos ES Contrast  Result Date: 07/22/2019 CLINICAL DATA:  New diagnosis lung cancer.  Headache.  Staging. EXAM: MRI HEAD WITHOUT AND WITH CONTRAST TECHNIQUE: Multiplanar, multiecho pulse sequences of the brain and surrounding structures were obtained without and with intravenous contrast. CONTRAST:  53mL GADAVIST GADOBUTROL 1 MMOL/ML IV SOLN COMPARISON:  None. FINDINGS: Brain: Ventricle size and cerebral volume normal. Diffuse white matter hypodensity similar to the prior CT. Moderate hyperintensity in the pons. No acute infarct. Negative for hemorrhage or mass. No enhancing lesions identified. Negative for metastatic disease. Vascular: Normal arterial flow voids. Skull and upper cervical spine: Negative Sinuses/Orbits: Negative Other: None IMPRESSION: Negative for metastatic disease Moderate to extensive changes in the white matter and cerebellum. This is likely due to chronic microvascular ischemia. Correlate with risk factors. Electronically Signed   By: Franchot Gallo M.D.   On: 07/22/2019 15:39   Nm Pet Image Initial (pi) Skull Base To Thigh  Result Date: 07/07/2019 CLINICAL DATA:  Initial treatment strategy for left upper lobe lung mass. EXAM: NUCLEAR MEDICINE PET SKULL BASE TO THIGH TECHNIQUE: 10.0 mCi F-18 FDG was injected intravenously. Full-ring PET imaging was performed from the skull base to thigh after the radiotracer. CT data was obtained and used for attenuation correction and anatomic localization. Fasting blood glucose: 129 mg/dl COMPARISON:  Multiple exams, including 04/09/2019 FINDINGS: Mediastinal blood pool activity: SUV max 3.1 Liver activity: SUV max 4.1 NECK: Symmetric palatine tonsillar activity. Incidental CT findings: Periventricular white matter hypodensity favoring chronic ischemic microvascular white matter disease. Bilateral  common carotid atherosclerotic calcification. CHEST: The 7.1 by 5.6 cm (measurement image: Image 105/3) lingular mass abuts both pleural and mediastinal margins and has a maximum SUV of 22.8.  Central reduced activity compatible with a small amount of central necrosis. No definite chest wall invasion or definite mediastinal invasion; no pleural effusion or pericardial effusion. Subcarinal lymph node 0.9 cm in short axis, maximum SUV 2.9 (less than blood pool). Incidental CT findings: Mild lingular atelectasis below the mass. Coronary, aortic arch, and branch vessel atherosclerotic vascular disease. Airway thickening is present, suggesting bronchitis or reactive airways disease. There is airway plugging in the lower lobes. Mild peripheral atelectasis in the left lower lobe. ABDOMEN/PELVIS: Physiologic activity in bowel. Mildly accentuated activity in the vicinity of the anus, maximum SUV 7.7. Most commonly this is physiologic, but strictly speaking non-specific. No CT correlate. Incidental CT findings: Cholecystectomy. Aortoiliac atherosclerotic vascular disease. Prior bilateral tubal ligation. SKELETON: No significant abnormal hypermetabolic activity in this region. Incidental CT findings: Lower cervical plate and screw fixator. IMPRESSION: 1. 7.1 cm lingular mass with maximum SUV 22.8, compatible with malignancy. 2. Normal sized subcarinal lymph node, not hypermetabolic. 3. No distant metastatic lesions are identified. 4. Mildly accentuated activity in the vicinity of the anus, maximum SUV 7.7, without CT correlate. Most commonly this type of activity is physiologic, correlate with colon screening history in determining whether any further workup such as digital rectal exam is warranted. 5. Airway thickening is present, suggesting bronchitis or reactive airways disease. Airway plugging in the lower lobes. 6. 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 Aortic Atherosclerosis (ICD10-I70.0). Coronary atherosclerosis. Electronically Signed   By: Van Clines M.D.   On: 07/07/2019 14:47   Ct Biopsy  Result Date: 07/16/2019 INDICATION: Enlarging hypermetabolic left upper lobe pulmonary mass. Please perform CT-guided biopsy for tissue diagnostic purposes. EXAM: CT GUIDED LEFT UPPER LOBE PULMONARY MASS BIOPSY COMPARISON:  PET-CT-07/07/2019; CHEST CT-04/09/2019; 12/17/2018 MEDICATIONS: None. ANESTHESIA/SEDATION: Fentanyl 50 mcg IV; Versed 1 mg IV Sedation time: 15 minutes; The patient was continuously monitored during the procedure by the interventional radiology nurse under my direct supervision. CONTRAST:  None COMPLICATIONS: None immediate. PROCEDURE: Informed consent was obtained from the patient following an explanation of the procedure, risks, benefits and alternatives. The patient understands,agrees and consents for the procedure. All questions were addressed. A time out was performed prior to the initiation of the procedure. The patient was positioned supine on the CT table and a limited chest CT was performed for procedural planning demonstrating unchanged size and appearance of the known hypermetabolic macrolobulated left upper lobe pulmonary nodule with dominant component measuring approximately 6.7 x 5.3 cm (image 9, series 2). The operative site was prepped and draped in the usual sterile fashion. Under sterile conditions and local anesthesia, a 17 gauge coaxial needle was advanced into the peripheral aspect of the nodule. Positioning was confirmed with intermittent CT fluoroscopy and followed by the acquisition of 5 core needle biopsy samples with an 18 gauge core needle biopsy device. The coaxial needle was removed and superficial hemostasis was achieved with manual compression. Limited post procedural chest CT was negative for pneumothorax or additional complication. A dressing was placed. The patient tolerated the procedure well  without immediate postprocedural complication. The patient was escorted to have an upright chest radiograph. IMPRESSION: Technically successful CT guided core needle core biopsy of enlarging hypermetabolic left upper lobe pulmonary mass. Electronically Signed   By: Sandi Mariscal M.D.   On: 07/16/2019 11:12   Dg Chest Port 1 View  Result Date: 07/16/2019 CLINICAL DATA:  Lung biopsy EXAM: PORTABLE CHEST 1 VIEW COMPARISON:  CT fluoroscopy from the same day  FINDINGS: Known left lung mass contacting the left heart border. No evidence of pneumothorax or bleeding. Normal heart size. No consolidation or pleural fluid. IMPRESSION: No evidence of complication after left lung mass biopsy. Electronically Signed   By: Monte Fantasia M.D.   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  PLAN:    1. Squamous cell carcinoma of the upper lobe of left lung: Imaging results reviewed independently and it was agreed upon the patient's final staging is considered IIb.  MRI of the brain on July 22, 2019 was reported as negative.  Patient had consultation with radiation oncology on July 24, 2019.  She will benefit from current chemotherapy using weekly carboplatinum and Taxol along with daily XRT.  Because she is only stage II, she does not qualify for maintenance durvalumab.  She has her simulation scheduled on July 29, 2019 and will likely start treatment approximately 1 week later.  Return to clinic in approximately 2 weeks to initiate cycle 1 of weekly carboplatinum and Taxol. 2.  Shortness of breath: Continue oxygen as prescribed.  I spent a total of 30 minutes face-to-face with the patient of which greater than 50% of the visit was spent in counseling and coordination of care as detailed above.  Patient expressed understanding and was in agreement with this plan. She also understands that She can call clinic at any time with any questions, concerns, or  complaints.   Cancer Staging Squamous cell carcinoma lung, left ([place]) Staging form: Lung, AJCC 8th Edition - Clinical stage from 07/26/2019: Stage IIB (cT3, cN0, cM0) - Signed by Lloyd Huger, MD on 07/26/2019   Lloyd Huger, MD   07/26/2019 6:59 PM

## 2019-07-22 ENCOUNTER — Encounter: Payer: Self-pay | Admitting: Oncology

## 2019-07-22 ENCOUNTER — Other Ambulatory Visit: Payer: Self-pay

## 2019-07-22 ENCOUNTER — Ambulatory Visit
Admission: RE | Admit: 2019-07-22 | Discharge: 2019-07-22 | Disposition: A | Discharge status: Home / Self Care | Payer: Medicare HMO | Source: Ambulatory Visit | Attending: Oncology | Admitting: Oncology

## 2019-07-22 DIAGNOSIS — R918 Other nonspecific abnormal finding of lung field: Secondary | ICD-10-CM | POA: Insufficient documentation

## 2019-07-22 DIAGNOSIS — R519 Headache, unspecified: Secondary | ICD-10-CM | POA: Diagnosis not present

## 2019-07-22 LAB — POCT I-STAT CREATININE: Creatinine, Ser: 0.9 mg/dL (ref 0.44–1.00)

## 2019-07-22 MED ORDER — GADOBUTROL 1 MMOL/ML IV SOLN
9.0000 mL | Freq: Once | INTRAVENOUS | Status: AC | PRN
  Administered 2019-07-22: 9 mL via INTRAVENOUS

## 2019-07-22 NOTE — Progress Notes (Signed)
Patient pre screened for office appointment, no questions or concerns today. 

## 2019-07-23 ENCOUNTER — Other Ambulatory Visit: Payer: Self-pay

## 2019-07-23 ENCOUNTER — Ambulatory Visit: Payer: Medicare HMO | Admitting: Radiation Oncology

## 2019-07-23 ENCOUNTER — Inpatient Hospital Stay: Payer: Medicare HMO | Attending: Oncology | Admitting: Oncology

## 2019-07-23 VITALS — BP 141/70 | HR 85 | Temp 97.8°F | Wt 203.0 lb

## 2019-07-23 DIAGNOSIS — Z9981 Dependence on supplemental oxygen: Secondary | ICD-10-CM | POA: Insufficient documentation

## 2019-07-23 DIAGNOSIS — F17210 Nicotine dependence, cigarettes, uncomplicated: Secondary | ICD-10-CM | POA: Insufficient documentation

## 2019-07-23 DIAGNOSIS — Z7189 Other specified counseling: Secondary | ICD-10-CM

## 2019-07-23 DIAGNOSIS — C3412 Malignant neoplasm of upper lobe, left bronchus or lung: Secondary | ICD-10-CM | POA: Diagnosis not present

## 2019-07-23 DIAGNOSIS — Z5111 Encounter for antineoplastic chemotherapy: Secondary | ICD-10-CM | POA: Diagnosis not present

## 2019-07-23 DIAGNOSIS — R0602 Shortness of breath: Secondary | ICD-10-CM | POA: Insufficient documentation

## 2019-07-23 DIAGNOSIS — F3341 Major depressive disorder, recurrent, in partial remission: Secondary | ICD-10-CM | POA: Diagnosis not present

## 2019-07-23 DIAGNOSIS — E782 Mixed hyperlipidemia: Secondary | ICD-10-CM | POA: Diagnosis not present

## 2019-07-23 DIAGNOSIS — F411 Generalized anxiety disorder: Secondary | ICD-10-CM | POA: Diagnosis not present

## 2019-07-23 DIAGNOSIS — C3492 Malignant neoplasm of unspecified part of left bronchus or lung: Secondary | ICD-10-CM | POA: Diagnosis not present

## 2019-07-23 DIAGNOSIS — I10 Essential (primary) hypertension: Secondary | ICD-10-CM | POA: Diagnosis not present

## 2019-07-23 DIAGNOSIS — E1165 Type 2 diabetes mellitus with hyperglycemia: Secondary | ICD-10-CM | POA: Diagnosis not present

## 2019-07-23 DIAGNOSIS — D649 Anemia, unspecified: Secondary | ICD-10-CM | POA: Diagnosis not present

## 2019-07-23 DIAGNOSIS — E7800 Pure hypercholesterolemia, unspecified: Secondary | ICD-10-CM | POA: Diagnosis not present

## 2019-07-23 DIAGNOSIS — Z720 Tobacco use: Secondary | ICD-10-CM | POA: Diagnosis not present

## 2019-07-23 DIAGNOSIS — I2510 Atherosclerotic heart disease of native coronary artery without angina pectoris: Secondary | ICD-10-CM | POA: Diagnosis not present

## 2019-07-23 DIAGNOSIS — N3941 Urge incontinence: Secondary | ICD-10-CM | POA: Diagnosis not present

## 2019-07-24 ENCOUNTER — Encounter: Payer: Self-pay | Admitting: Radiation Oncology

## 2019-07-24 ENCOUNTER — Other Ambulatory Visit: Payer: Self-pay

## 2019-07-24 ENCOUNTER — Ambulatory Visit
Admission: RE | Admit: 2019-07-24 | Discharge: 2019-07-24 | Disposition: A | Discharge status: Home / Self Care | Payer: Medicare HMO | Source: Ambulatory Visit | Attending: Radiation Oncology | Admitting: Radiation Oncology

## 2019-07-24 VITALS — BP 159/72 | HR 71 | Temp 99.1°F | Resp 16 | Wt 202.4 lb

## 2019-07-24 DIAGNOSIS — R918 Other nonspecific abnormal finding of lung field: Secondary | ICD-10-CM

## 2019-07-24 DIAGNOSIS — Z923 Personal history of irradiation: Secondary | ICD-10-CM | POA: Diagnosis not present

## 2019-07-24 DIAGNOSIS — Z9221 Personal history of antineoplastic chemotherapy: Secondary | ICD-10-CM | POA: Insufficient documentation

## 2019-07-24 DIAGNOSIS — C3412 Malignant neoplasm of upper lobe, left bronchus or lung: Secondary | ICD-10-CM | POA: Insufficient documentation

## 2019-07-24 NOTE — Progress Notes (Signed)
Radiation Oncology Follow up Note  Name: Suzanne Glenn   Date:   07/24/2019 MRN:  [id number] DOB: [birth year]    This 68 y.o. female presents to the clinic today for follow-up of her recent biopsy of left upper lobe mass positive for.  Squamous cell carcinoma  REFERRING PROVIDER: Tracie Harrier, MD  HPI: Patient is a 68 year old female.  Seen recently for a evaluation of a left upper lobe mass presumed primary bronchogenic carcinoma PET positive with no evidence of mediastinal adenopathy.  Mass is 7.1 cm with SUV of 22.8 on PET CT scan.  No subcarinal lymph nodes are involved.  And no distant metastatic disease was noted CT-guided biopsy was positive for squamous cell carcinoma.  She has been seen by medical oncology and is presumed to have concurrent chemoradiation.  She is seen today and otherwise is doing well she had an MRI scan of her brain showing no evidence of metastatic disease.  COMPLICATIONS OF TREATMENT: none  FOLLOW UP COMPLIANCE: keeps appointments   PHYSICAL EXAM:  BP (!) 159/72 (BP Location: Left Arm)   Pulse 71   Temp 99.1 F (37.3 C) (Tympanic)   Resp 16   Wt 202 lb 6.4 oz (91.8 kg)   BMI 35.85 kg/m  Well-developed well-nourished patient in NAD. HEENT reveals PERLA, EOMI, discs not visualized.  Oral cavity is clear. No oral mucosal lesions are identified. Neck is clear without evidence of cervical or supraclavicular adenopathy. Lungs are clear to A&P. Cardiac examination is essentially unremarkable with regular rate and rhythm without murmur rub or thrill. Abdomen is benign with no organomegaly or masses noted. Motor sensory and DTR levels are equal and symmetric in the upper and lower extremities. Cranial nerves II through XII are grossly intact. Proprioception is intact. No peripheral adenopathy or edema is identified. No motor or sensory levels are noted. Crude visual fields are within normal range.  RADIOLOGY RESULTS: MRI scan of the brain and PET CT scans are  reviewed compatible with above-stated findings  PLAN: At this time I would stage this patient as stage IIb.  I would plan on delivering 7000 cGy to her mass using 3-dimensional treatment planning PET/CT fusion study.  Risks and benefits of treatment including possible radiation esophagitis skin reaction fatigue alteration of blood counts increased production of cough all were discussed in detail with the patient.  There will be extra effort by both professional staff as well as technical staff to coordinate and manage concurrent chemoradiation and ensuing side effects during her treatments.  I have personally set up and ordered CT's simulation for next week.  We will coordinate with medical oncology her chemotherapy.  Patient comprehends her treatment plan well.  I would like to take this opportunity to thank you for allowing me to participate in the care of your patient.Noreene Filbert, MD

## 2019-07-26 DIAGNOSIS — Z7189 Other specified counseling: Secondary | ICD-10-CM | POA: Insufficient documentation

## 2019-07-26 DIAGNOSIS — C3492 Malignant neoplasm of unspecified part of left bronchus or lung: Secondary | ICD-10-CM | POA: Insufficient documentation

## 2019-07-26 MED ORDER — PROCHLORPERAZINE MALEATE 10 MG PO TABS: 10.0000 mg | ORAL_TABLET | Freq: Four times a day (QID) | ORAL | 2 refills | Status: DC | PRN

## 2019-07-26 NOTE — Progress Notes (Signed)
Non-Small Cell Lung - No Medical Intervention - Off Treatment.  Patient Characteristics: Stage IIA/IIB - Unresectable AJCC T Category: T3 Current Disease Status: No Distant Mets or Local Recurrence AJCC N Category: N0 AJCC M Category: M0 AJCC 8 Stage Grouping: IIB

## 2019-07-28 ENCOUNTER — Other Ambulatory Visit: Payer: Self-pay

## 2019-07-29 ENCOUNTER — Other Ambulatory Visit: Payer: Self-pay

## 2019-07-29 ENCOUNTER — Ambulatory Visit
Admission: RE | Admit: 2019-07-29 | Discharge: 2019-07-29 | Disposition: A | Discharge status: Home / Self Care | Payer: Medicare HMO | Source: Ambulatory Visit | Attending: Radiation Oncology | Admitting: Radiation Oncology

## 2019-07-29 DIAGNOSIS — Z510 Encounter for antineoplastic radiation therapy: Secondary | ICD-10-CM | POA: Insufficient documentation

## 2019-07-29 DIAGNOSIS — C3412 Malignant neoplasm of upper lobe, left bronchus or lung: Secondary | ICD-10-CM | POA: Diagnosis not present

## 2019-07-29 NOTE — Patient Instructions (Signed)
Paclitaxel injection What is this medicine? PACLITAXEL (PAK li TAX el) is a chemotherapy drug. It targets fast dividing cells, like cancer cells, and causes these cells to die. This medicine is used to treat ovarian cancer, breast cancer, lung cancer, Kaposi's sarcoma, and other cancers. This medicine may be used for other purposes; ask your health care provider or pharmacist if you have questions. COMMON BRAND NAME(S): Onxol, Taxol What should I tell my health care provider before I take this medicine? They need to know if you have any of these conditions:  history of irregular heartbeat  liver disease  low blood counts, like low white cell, platelet, or red cell counts  lung or breathing disease, like asthma  tingling of the fingers or toes, or other nerve disorder  an unusual or allergic reaction to paclitaxel, alcohol, polyoxyethylated castor oil, other chemotherapy, other medicines, foods, dyes, or preservatives  pregnant or trying to get pregnant  breast-feeding How should I use this medicine? This drug is given as an infusion into a vein. It is administered in a hospital or clinic by a specially trained health care professional. Talk to your pediatrician regarding the use of this medicine in children. Special care may be needed. Overdosage: If you think you have taken too much of this medicine contact a poison control center or emergency room at once. NOTE: This medicine is only for you. Do not share this medicine with others. What if I miss a dose? It is important not to miss your dose. Call your doctor or health care professional if you are unable to keep an appointment. What may interact with this medicine? Do not take this medicine with any of the following medications:  disulfiram  metronidazole This medicine may also interact with the following medications:  antiviral medicines for hepatitis, HIV or AIDS  certain antibiotics like erythromycin and  clarithromycin  certain medicines for fungal infections like ketoconazole and itraconazole  certain medicines for seizures like carbamazepine, phenobarbital, phenytoin  gemfibrozil  nefazodone  rifampin  St. John's wort This list may not describe all possible interactions. Give your health care provider a list of all the medicines, herbs, non-prescription drugs, or dietary supplements you use. Also tell them if you smoke, drink alcohol, or use illegal drugs. Some items may interact with your medicine. What should I watch for while using this medicine? Your condition will be monitored carefully while you are receiving this medicine. You will need important blood work done while you are taking this medicine. This medicine can cause serious allergic reactions. To reduce your risk you will need to take other medicine(s) before treatment with this medicine. If you experience allergic reactions like skin rash, itching or hives, swelling of the face, lips, or tongue, tell your doctor or health care professional right away. In some cases, you may be given additional medicines to help with side effects. Follow all directions for their use. This drug may make you feel generally unwell. This is not uncommon, as chemotherapy can affect healthy cells as well as cancer cells. Report any side effects. Continue your course of treatment even though you feel ill unless your doctor tells you to stop. Call your doctor or health care professional for advice if you get a fever, chills or sore throat, or other symptoms of a cold or flu. Do not treat yourself. This drug decreases your body's ability to fight infections. Try to avoid being around people who are sick. This medicine may increase your risk to bruise  or bleed. Call your doctor or health care professional if you notice any unusual bleeding. Be careful brushing and flossing your teeth or using a toothpick because you may get an infection or bleed more easily.  If you have any dental work done, tell your dentist you are receiving this medicine. Avoid taking products that contain aspirin, acetaminophen, ibuprofen, naproxen, or ketoprofen unless instructed by your doctor. These medicines may hide a fever. Do not become pregnant while taking this medicine. Women should inform their doctor if they wish to become pregnant or think they might be pregnant. There is a potential for serious side effects to an unborn child. Talk to your health care professional or pharmacist for more information. Do not breast-feed an infant while taking this medicine. Men are advised not to father a child while receiving this medicine. This product may contain alcohol. Ask your pharmacist or healthcare provider if this medicine contains alcohol. Be sure to tell all healthcare providers you are taking this medicine. Certain medicines, like metronidazole and disulfiram, can cause an unpleasant reaction when taken with alcohol. The reaction includes flushing, headache, nausea, vomiting, sweating, and increased thirst. The reaction can last from 30 minutes to several hours. What side effects may I notice from receiving this medicine? Side effects that you should report to your doctor or health care professional as soon as possible:  allergic reactions like skin rash, itching or hives, swelling of the face, lips, or tongue  breathing problems  changes in vision  fast, irregular heartbeat  high or low blood pressure  mouth sores  pain, tingling, numbness in the hands or feet  signs of decreased platelets or bleeding - bruising, pinpoint red spots on the skin, black, tarry stools, blood in the urine  signs of decreased red blood cells - unusually weak or tired, feeling faint or lightheaded, falls  signs of infection - fever or chills, cough, sore throat, pain or difficulty passing urine  signs and symptoms of liver injury like dark yellow or brown urine; general ill feeling or  flu-like symptoms; light-colored stools; loss of appetite; nausea; right upper belly pain; unusually weak or tired; yellowing of the eyes or skin  swelling of the ankles, feet, hands  unusually slow heartbeat Side effects that usually do not require medical attention (report to your doctor or health care professional if they continue or are bothersome):  diarrhea  hair loss  loss of appetite  muscle or joint pain  nausea, vomiting  pain, redness, or irritation at site where injected  tiredness This list may not describe all possible side effects. Call your doctor for medical advice about side effects. You may report side effects to FDA at 1-800-FDA-1088. Where should I keep my medicine? This drug is given in a hospital or clinic and will not be stored at home. NOTE: This sheet is a summary. It may not cover all possible information. If you have questions about this medicine, talk to your doctor, pharmacist, or health care provider.  2020 [employer] (2017-06-04 13:14:55) Carboplatin injection What is this medicine? CARBOPLATIN (KAR boe pla tin) is a chemotherapy drug. It targets fast dividing cells, like cancer cells, and causes these cells to die. This medicine is used to treat ovarian cancer and many other cancers. This medicine may be used for other purposes; ask your health care provider or pharmacist if you have questions. COMMON BRAND NAME(S): Paraplatin What should I tell my health care provider before I take this medicine? They need to  know if you have any of these conditions:  blood disorders  hearing problems  kidney disease  recent or ongoing radiation therapy  an unusual or allergic reaction to carboplatin, cisplatin, other chemotherapy, other medicines, foods, dyes, or preservatives  pregnant or trying to get pregnant  breast-feeding How should I use this medicine? This drug is usually given as an infusion into a vein. It is administered in a  hospital or clinic by a specially trained health care professional. Talk to your pediatrician regarding the use of this medicine in children. Special care may be needed. Overdosage: If you think you have taken too much of this medicine contact a poison control center or emergency room at once. NOTE: This medicine is only for you. Do not share this medicine with others. What if I miss a dose? It is important not to miss a dose. Call your doctor or health care professional if you are unable to keep an appointment. What may interact with this medicine?  medicines for seizures  medicines to increase blood counts like filgrastim, pegfilgrastim, sargramostim  some antibiotics like amikacin, gentamicin, neomycin, streptomycin, tobramycin  vaccines Talk to your doctor or health care professional before taking any of these medicines:  acetaminophen  aspirin  ibuprofen  ketoprofen  naproxen This list may not describe all possible interactions. Give your health care provider a list of all the medicines, herbs, non-prescription drugs, or dietary supplements you use. Also tell them if you smoke, drink alcohol, or use illegal drugs. Some items may interact with your medicine. What should I watch for while using this medicine? Your condition will be monitored carefully while you are receiving this medicine. You will need important blood work done while you are taking this medicine. This drug may make you feel generally unwell. This is not uncommon, as chemotherapy can affect healthy cells as well as cancer cells. Report any side effects. Continue your course of treatment even though you feel ill unless your doctor tells you to stop. In some cases, you may be given additional medicines to help with side effects. Follow all directions for their use. Call your doctor or health care professional for advice if you get a fever, chills or sore throat, or other symptoms of a cold or flu. Do not treat  yourself. This drug decreases your body's ability to fight infections. Try to avoid being around people who are sick. This medicine may increase your risk to bruise or bleed. Call your doctor or health care professional if you notice any unusual bleeding. Be careful brushing and flossing your teeth or using a toothpick because you may get an infection or bleed more easily. If you have any dental work done, tell your dentist you are receiving this medicine. Avoid taking products that contain aspirin, acetaminophen, ibuprofen, naproxen, or ketoprofen unless instructed by your doctor. These medicines may hide a fever. Do not become pregnant while taking this medicine. Women should inform their doctor if they wish to become pregnant or think they might be pregnant. There is a potential for serious side effects to an unborn child. Talk to your health care professional or pharmacist for more information. Do not breast-feed an infant while taking this medicine. What side effects may I notice from receiving this medicine? Side effects that you should report to your doctor or health care professional as soon as possible:  allergic reactions like skin rash, itching or hives, swelling of the face, lips, or tongue  signs of infection - fever or  chills, cough, sore throat, pain or difficulty passing urine  signs of decreased platelets or bleeding - bruising, pinpoint red spots on the skin, black, tarry stools, nosebleeds  signs of decreased red blood cells - unusually weak or tired, fainting spells, lightheadedness  breathing problems  changes in hearing  changes in vision  chest pain  high blood pressure  low blood counts - This drug may decrease the number of white blood cells, red blood cells and platelets. You may be at increased risk for infections and bleeding.  nausea and vomiting  pain, swelling, redness or irritation at the injection site  pain, tingling, numbness in the hands or  feet  problems with balance, talking, walking  trouble passing urine or change in the amount of urine Side effects that usually do not require medical attention (report to your doctor or health care professional if they continue or are bothersome):  hair loss  loss of appetite  metallic taste in the mouth or changes in taste This list may not describe all possible side effects. Call your doctor for medical advice about side effects. You may report side effects to FDA at 1-800-FDA-1088. Where should I keep my medicine? This drug is given in a hospital or clinic and will not be stored at home. NOTE: This sheet is a summary. It may not cover all possible information. If you have questions about this medicine, talk to your doctor, pharmacist, or health care provider.  2020 [employer] Standard (2008-01-06 14:38:05)

## 2019-07-30 DIAGNOSIS — Z0000 Encounter for general adult medical examination without abnormal findings: Secondary | ICD-10-CM | POA: Diagnosis not present

## 2019-07-30 DIAGNOSIS — Z7984 Long term (current) use of oral hypoglycemic drugs: Secondary | ICD-10-CM | POA: Diagnosis not present

## 2019-07-30 DIAGNOSIS — C3412 Malignant neoplasm of upper lobe, left bronchus or lung: Secondary | ICD-10-CM | POA: Diagnosis not present

## 2019-07-30 DIAGNOSIS — J449 Chronic obstructive pulmonary disease, unspecified: Secondary | ICD-10-CM | POA: Diagnosis not present

## 2019-07-30 DIAGNOSIS — I10 Essential (primary) hypertension: Secondary | ICD-10-CM | POA: Diagnosis not present

## 2019-07-30 DIAGNOSIS — Z510 Encounter for antineoplastic radiation therapy: Secondary | ICD-10-CM | POA: Diagnosis not present

## 2019-07-30 DIAGNOSIS — E782 Mixed hyperlipidemia: Secondary | ICD-10-CM | POA: Diagnosis not present

## 2019-07-30 DIAGNOSIS — G2581 Restless legs syndrome: Secondary | ICD-10-CM | POA: Diagnosis not present

## 2019-07-30 DIAGNOSIS — E119 Type 2 diabetes mellitus without complications: Secondary | ICD-10-CM | POA: Diagnosis not present

## 2019-07-30 DIAGNOSIS — J9610 Chronic respiratory failure, unspecified whether with hypoxia or hypercapnia: Secondary | ICD-10-CM | POA: Diagnosis not present

## 2019-07-31 ENCOUNTER — Other Ambulatory Visit: Payer: Self-pay

## 2019-07-31 NOTE — Progress Notes (Signed)
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  INTERVAL HISTORY: Patient returns to clinic today for further evaluation and initiation of cycle 1 of weekly carboplatinum and Taxol.  Suzanne Glenn will initiate daily XRT tomorrow.  Suzanne Glenn currently feels well and is at her baseline. Suzanne Glenn has chronic shortness of breath and requires oxygen 24 hours a day.  Suzanne Glenn has no neurologic complaints.  Suzanne Glenn denies any recent fevers or illnesses.  Suzanne Glenn has a good appetite and denies weight loss.  Suzanne Glenn denies any pain.  Suzanne Glenn has no chest pain, cough, or hemoptysis.  Suzanne Glenn denies any nausea, vomiting, constipation, or diarrhea.  Suzanne Glenn has no urinary complaints.  Patient offers no further specific complaints today.  REVIEW OF SYSTEMS:   Review of Systems  Constitutional: Negative.  Negative for fever, malaise/fatigue and weight loss.  Respiratory: Positive for shortness of breath. Negative for cough and hemoptysis.   Cardiovascular: Negative.  Negative for chest pain and leg swelling.  Gastrointestinal: Negative.  Negative for abdominal pain.  Genitourinary: Negative.  Negative for dysuria.  Musculoskeletal: Negative.  Negative for back pain.  Skin: Negative.  Negative for rash.  Neurological: Negative.  Negative for dizziness, weakness and headaches.  Psychiatric/Behavioral: Negative.  The patient is not nervous/anxious.     As per HPI. Otherwise, a complete review of systems is negative.  PAST MEDICAL HISTORY: Past Medical History:  Diagnosis Date  . Anxiety   . Asthma   . Cancer ([place]) 12/2018   w/u for right upper lobe mass/cancer  . COPD (chronic obstructive pulmonary disease) ([facility])    also, emphysema. now using o2 via np 24 hours a day  . Coronary artery  disease   . Depression   . Diabetes mellitus, type II ([place])   . GERD (gastroesophageal reflux disease)   . Headache    migraines in early 20's  . Heart murmur   . Hypertension   . Lung cancer ([place])   . Neuropathy   . Restless leg     PAST SURGICAL HISTORY: Past Surgical History:  Procedure Laterality Date  . BACK SURGERY  2005   surgery x 2, disc fused in neck, pinched nerve in center of back and neck  . CARDIAC CATHETERIZATION  2015   1 stent placed for blockage  . cervical bone infusion    . CHOLECYSTECTOMY    . THORACIC DISC SURGERY    . TUBAL LIGATION    . VIDEO BRONCHOSCOPY WITH ENDOBRONCHIAL ULTRASOUND N/A 03/06/2019   Procedure: VIDEO BRONCHOSCOPY WITH ENDOBRONCHIAL ULTRASOUND - DIABETIC;  Surgeon: Ottie Glazier, MD;  Location: [facility] ORS;  Service: Thoracic;  Laterality: N/A;    FAMILY HISTORY: Family History  Problem Relation Age of Onset  . Anxiety disorder Mother   . Cancer - Lung Mother   . Depression Sister   . Cancer Sister   . Depression Brother   . Cancer Sister   . Cancer Sister   . Cancer Sister   . Heart Problems Sister   . Bipolar disorder Sister   . Diabetes Sister   . Cancer - Lung Sister   . Hypertension Sister   . Diabetes Sister   . Hyperlipidemia Sister   . COPD Sister   . Heart Problems Brother   . Cancer Brother   .  Cancer Brother   . Cancer Brother   . Breast cancer Maternal Aunt     ADVANCED DIRECTIVES (Y/N):  N  HEALTH MAINTENANCE: Social History   Tobacco Use  . Smoking status: Current Every Day Smoker    Packs/day: 1.00    Years: 40.00    Pack years: 40.00    Types: Cigarettes    Start date: 05/05/1975  . Smokeless tobacco: Never Used  Substance Use Topics  . Alcohol use: No    Alcohol/week: 0.0 standard drinks    Comment: socially  . Drug use: No     Colonoscopy:  PAP:  Bone density:  Lipid panel:  Allergies  Allergen Reactions  . Diclofenac-Misoprostol Other (See Comments)    Arthrotec - gastritis   .  Nsaids Other (See Comments)    gastritis  . Zolpidem Other (See Comments)    Sleep walking   . Hydrocodone-Acetaminophen Nausea And Vomiting  . Simvastatin Other (See Comments)    body aches    Current Outpatient Medications  Medication Sig Dispense Refill  . ACCU-CHEK FASTCLIX LANCETS MISC     . ACCU-CHEK SMARTVIEW test strip     . albuterol (PROVENTIL HFA) 108 (90 BASE) MCG/ACT inhaler Inhale 2 puffs into the lungs every 4 (four) hours as needed for wheezing or shortness of breath.     Marland Kitchen albuterol (PROVENTIL) (2.5 MG/3ML) 0.083% nebulizer solution Take 2.5 mg by nebulization every 6 (six) hours as needed for wheezing or shortness of breath.    Marland Kitchen amLODipine (NORVASC) 10 MG tablet Take 10 mg by mouth daily.     Marland Kitchen aspirin 81 MG chewable tablet Chew 1 tablet (81 mg total) by mouth daily. Continue holding now, as you are doing until procedure. (Patient taking differently: Chew 81 mg by mouth daily. ) 30 tablet 0  . carbidopa-levodopa (SINEMET IR) 25-100 MG tablet Take 1 tablet by mouth 3 (three) times daily.    . diclofenac sodium (VOLTAREN) 1 % GEL     . esomeprazole (NEXIUM) 40 MG capsule Take 40 mg by mouth daily.    . ferrous sulfate 325 (65 FE) MG tablet Take 1 tablet (325 mg total) by mouth 2 (two) times daily with a meal. 30 tablet 3  . fluticasone (FLONASE) 50 MCG/ACT nasal spray Place 2 sprays into both nostrils daily as needed for allergies.     . Fluticasone-Umeclidin-Vilant 100-62.5-25 MCG/INH AEPB Inhale 1 puff into the lungs daily. Treligy    . gabapentin (NEURONTIN) 100 MG capsule Take 1 capsule (100 mg total) by mouth 2 (two) times daily. QAM, QNOON (Patient taking differently: Take 200 mg by mouth daily. QAM) 180 capsule 2  . gabapentin (NEURONTIN) 400 MG capsule Take 1 capsule (400 mg total) by mouth 2 (two) times daily. 400 mg around 1300, and 2000 180 capsule 2  . ipratropium (ATROVENT) 0.02 % nebulizer solution Take 2.5 mLs (0.5 mg total) by nebulization every 6 (six) hours  as needed for wheezing or shortness of breath. 75 mL 12  . metFORMIN (GLUCOPHAGE) 500 MG tablet Take 500 mg by mouth daily with breakfast.     . metoprolol tartrate (LOPRESSOR) 25 MG tablet Take 25 mg by mouth 2 (two) times daily.     . Multiple Vitamin (MULTIVITAMIN) tablet Take 1 tablet by mouth daily.    . OXYGEN Inhale 2 L into the lungs continuous.    . potassium chloride (KLOR-CON) 8 MEQ tablet Take 8 mEq by mouth daily.     . pravastatin (  PRAVACHOL) 80 MG tablet Take 80 mg by mouth every evening.     . prochlorperazine (COMPAZINE) 10 MG tablet Take 1 tablet (10 mg total) by mouth every 6 (six) hours as needed (Nausea or vomiting). 60 tablet 2  . spironolactone (ALDACTONE) 50 MG tablet Take 50 mg by mouth daily.     Marland Kitchen venlafaxine XR (EFFEXOR-XR) 150 MG 24 hr capsule Take 1 capsule (150 mg total) by mouth daily with breakfast. 90 capsule 1  . solifenacin (VESICARE) 10 MG tablet Take 10 mg by mouth daily.     No current facility-administered medications for this visit.     OBJECTIVE: Vitals:   08/05/19 0945  BP: 128/74  Pulse: 76  Temp: 98.1 F (36.7 C)  SpO2: 95%     Body mass index is 35.78 kg/m.    ECOG FS:0 - Asymptomatic  General: Well-developed, well-nourished, no acute distress. Eyes: Pink conjunctiva, anicteric sclera. HEENT: Normocephalic, moist mucous membranes. Lungs: Diminished breath sounds. Heart: Regular rate and rhythm. No rubs, murmurs, or gallops. Abdomen: Soft, nontender, nondistended. No organomegaly noted, normoactive bowel sounds. Musculoskeletal: No edema, cyanosis, or clubbing. Neuro: Alert, answering all questions appropriately. Cranial nerves grossly intact. Skin: No rashes or petechiae noted. Psych: Normal affect.  LAB RESULTS:  Lab Results  Component Value Date   NA 138 08/05/2019   K 4.0 08/05/2019   CL 100 08/05/2019   CO2 31 08/05/2019   GLUCOSE 88 08/05/2019   BUN 14 08/05/2019   CREATININE 0.67 08/05/2019   CALCIUM 9.6 08/05/2019    PROT 7.5 08/05/2019   ALBUMIN 4.0 08/05/2019   AST 15 08/05/2019   ALT 5 08/05/2019   ALKPHOS 76 08/05/2019   BILITOT 0.5 08/05/2019   GFRNONAA >60 08/05/2019   GFRAA >60 08/05/2019    Lab Results  Component Value Date   WBC 8.6 08/05/2019   NEUTROABS 6.0 08/05/2019   HGB 13.3 08/05/2019   HCT 42.1 08/05/2019   MCV 91.3 08/05/2019   PLT 325 08/05/2019     STUDIES: Mr Jeri Cos ZY Contrast  Result Date: 07/22/2019 CLINICAL DATA:  New diagnosis lung cancer.  Headache.  Staging. EXAM: MRI HEAD WITHOUT AND WITH CONTRAST TECHNIQUE: Multiplanar, multiecho pulse sequences of the brain and surrounding structures were obtained without and with intravenous contrast. CONTRAST:  76mL GADAVIST GADOBUTROL 1 MMOL/ML IV SOLN COMPARISON:  None. FINDINGS: Brain: Ventricle size and cerebral volume normal. Diffuse white matter hypodensity similar to the prior CT. Moderate hyperintensity in the pons. No acute infarct. Negative for hemorrhage or mass. No enhancing lesions identified. Negative for metastatic disease. Vascular: Normal arterial flow voids. Skull and upper cervical spine: Negative Sinuses/Orbits: Negative Other: None IMPRESSION: Negative for metastatic disease Moderate to extensive changes in the white matter and cerebellum. This is likely due to chronic microvascular ischemia. Correlate with risk factors. Electronically Signed   By: Franchot Gallo M.D.   On: 07/22/2019 15:39   Nm Pet Image Initial (pi) Skull Base To Thigh  Result Date: 07/07/2019 CLINICAL DATA:  Initial treatment strategy for left upper lobe lung mass. EXAM: NUCLEAR MEDICINE PET SKULL BASE TO THIGH TECHNIQUE: 10.0 mCi F-18 FDG was injected intravenously. Full-ring PET imaging was performed from the skull base to thigh after the radiotracer. CT data was obtained and used for attenuation correction and anatomic localization. Fasting blood glucose: 129 mg/dl COMPARISON:  Multiple exams, including 04/09/2019 FINDINGS: Mediastinal blood  pool activity: SUV max 3.1 Liver activity: SUV max 4.1 NECK: Symmetric palatine tonsillar activity. Incidental CT  findings: Periventricular white matter hypodensity favoring chronic ischemic microvascular white matter disease. Bilateral common carotid atherosclerotic calcification. CHEST: The 7.1 by 5.6 cm (measurement image: Image 105/3) lingular mass abuts both pleural and mediastinal margins and has a maximum SUV of 22.8. Central reduced activity compatible with a small amount of central necrosis. No definite chest wall invasion or definite mediastinal invasion; no pleural effusion or pericardial effusion. Subcarinal lymph node 0.9 cm in short axis, maximum SUV 2.9 (less than blood pool). Incidental CT findings: Mild lingular atelectasis below the mass. Coronary, aortic arch, and branch vessel atherosclerotic vascular disease. Airway thickening is present, suggesting bronchitis or reactive airways disease. There is airway plugging in the lower lobes. Mild peripheral atelectasis in the left lower lobe. ABDOMEN/PELVIS: Physiologic activity in bowel. Mildly accentuated activity in the vicinity of the anus, maximum SUV 7.7. Most commonly this is physiologic, but strictly speaking non-specific. No CT correlate. Incidental CT findings: Cholecystectomy. Aortoiliac atherosclerotic vascular disease. Prior bilateral tubal ligation. SKELETON: No significant abnormal hypermetabolic activity in this region. Incidental CT findings: Lower cervical plate and screw fixator. IMPRESSION: 1. 7.1 cm lingular mass with maximum SUV 22.8, compatible with malignancy. 2. Normal sized subcarinal lymph node, not hypermetabolic. 3. No distant metastatic lesions are identified. 4. Mildly accentuated activity in the vicinity of the anus, maximum SUV 7.7, without CT correlate. Most commonly this type of activity is physiologic, correlate with colon screening history in determining whether any further workup such as digital rectal exam is  warranted. 5. Airway thickening is present, suggesting bronchitis or reactive airways disease. Airway plugging in the lower lobes. 6. 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 Aortic Atherosclerosis (ICD10-I70.0). Coronary atherosclerosis. Electronically Signed   By: Van Clines M.D.   On: 07/07/2019 14:47   Ct Biopsy  Result Date: 07/16/2019 INDICATION: Enlarging hypermetabolic left upper lobe pulmonary mass. Please perform CT-guided biopsy for tissue diagnostic purposes. EXAM: CT GUIDED LEFT UPPER LOBE PULMONARY MASS BIOPSY COMPARISON:  PET-CT-07/07/2019; CHEST CT-04/09/2019; 12/17/2018 MEDICATIONS: None. ANESTHESIA/SEDATION: Fentanyl 50 mcg IV; Versed 1 mg IV Sedation time: 15 minutes; The patient was continuously monitored during the procedure by the interventional radiology nurse under my direct supervision. CONTRAST:  None COMPLICATIONS: None immediate. PROCEDURE: Informed consent was obtained from the patient following an explanation of the procedure, risks, benefits and alternatives. The patient understands,agrees and consents for the procedure. All questions were addressed. A time out was performed prior to the initiation of the procedure. The patient was positioned supine on the CT table and a limited chest CT was performed for procedural planning demonstrating unchanged size and appearance of the known hypermetabolic macrolobulated left upper lobe pulmonary nodule with dominant component measuring approximately 6.7 x 5.3 cm (image 9, series 2). The operative site was prepped and draped in the usual sterile fashion. Under sterile conditions and local anesthesia, a 17 gauge coaxial needle was advanced into the peripheral aspect of the nodule. Positioning was confirmed with intermittent CT fluoroscopy and followed by the acquisition of 5 core needle biopsy samples with an 18 gauge core needle biopsy device. The  coaxial needle was removed and superficial hemostasis was achieved with manual compression. Limited post procedural chest CT was negative for pneumothorax or additional complication. A dressing was placed. The patient tolerated the procedure well without immediate postprocedural complication. The patient was escorted to have an upright chest radiograph. IMPRESSION: Technically successful CT guided core needle core biopsy of enlarging hypermetabolic left upper lobe pulmonary mass. Electronically  Signed   By: Sandi Mariscal M.D.   On: 07/16/2019 11:12   Dg Chest Port 1 View  Result Date: 07/16/2019 CLINICAL DATA:  Lung biopsy EXAM: PORTABLE CHEST 1 VIEW COMPARISON:  CT fluoroscopy from the same day FINDINGS: Known left lung mass contacting the left heart border. No evidence of pneumothorax or bleeding. Normal heart size. No consolidation or pleural fluid. IMPRESSION: No evidence of complication after left lung mass biopsy. Electronically Signed   By: Monte Fantasia M.D.   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  PLAN:    1. Squamous cell carcinoma of the upper lobe of left lung: Imaging results reviewed independently and it was agreed upon the patient's final staging is considered IIb.  MRI of the brain on July 22, 2019 was reported as negative. Suzanne Glenn will benefit from current chemotherapy using weekly carboplatinum and Taxol along with daily XRT.  Because Suzanne Glenn is only stage II, Suzanne Glenn does not qualify for maintenance durvalumab.  Proceed with cycle 1 of weekly carboplatinum and Taxol today.  Patient will initiate daily XRT tomorrow.  Return to clinic in 1 week for further evaluation and consideration of cycle 2. 2.  Shortness of breath: Continue oxygen as prescribed.  I spent a total of 30 minutes face-to-face with the patient of which greater than 50% of the visit was spent in counseling and coordination of care as detailed above.   Patient  expressed understanding and was in agreement with this plan. Suzanne Glenn also understands that Suzanne Glenn can call clinic at any time with any questions, concerns, or complaints.   Cancer Staging Squamous cell carcinoma lung, left ([place]) Staging form: Lung, AJCC 8th Edition - Clinical stage from 07/26/2019: Stage IIB (cT3, cN0, cM0) - Signed by Lloyd Huger, MD on 07/26/2019   Lloyd Huger, MD   08/06/2019 6:34 AM

## 2019-08-03 ENCOUNTER — Inpatient Hospital Stay: Payer: Medicare HMO

## 2019-08-03 ENCOUNTER — Other Ambulatory Visit: Payer: Self-pay

## 2019-08-03 ENCOUNTER — Inpatient Hospital Stay: Payer: Medicare HMO | Admitting: Nurse Practitioner

## 2019-08-04 ENCOUNTER — Other Ambulatory Visit: Payer: Self-pay

## 2019-08-04 NOTE — Progress Notes (Signed)
Patient pre screened for office appointment, no questions or concerns today. 

## 2019-08-05 ENCOUNTER — Inpatient Hospital Stay: Payer: Medicare HMO

## 2019-08-05 ENCOUNTER — Inpatient Hospital Stay (HOSPITAL_BASED_OUTPATIENT_CLINIC_OR_DEPARTMENT_OTHER): Payer: Medicare HMO | Admitting: Oncology

## 2019-08-05 ENCOUNTER — Other Ambulatory Visit: Payer: Self-pay

## 2019-08-05 ENCOUNTER — Ambulatory Visit
Admission: RE | Admit: 2019-08-05 | Discharge: 2019-08-05 | Disposition: A | Discharge status: Home / Self Care | Payer: Medicare HMO | Source: Ambulatory Visit | Attending: Radiation Oncology | Admitting: Radiation Oncology

## 2019-08-05 VITALS — BP 131/82 | HR 71 | Temp 97.1°F | Resp 20

## 2019-08-05 VITALS — BP 128/74 | HR 76 | Temp 98.1°F | Ht 63.0 in | Wt 202.0 lb

## 2019-08-05 DIAGNOSIS — C3492 Malignant neoplasm of unspecified part of left bronchus or lung: Secondary | ICD-10-CM

## 2019-08-05 DIAGNOSIS — R0602 Shortness of breath: Secondary | ICD-10-CM | POA: Diagnosis not present

## 2019-08-05 DIAGNOSIS — Z9981 Dependence on supplemental oxygen: Secondary | ICD-10-CM | POA: Diagnosis not present

## 2019-08-05 DIAGNOSIS — Z510 Encounter for antineoplastic radiation therapy: Secondary | ICD-10-CM | POA: Diagnosis not present

## 2019-08-05 DIAGNOSIS — C3412 Malignant neoplasm of upper lobe, left bronchus or lung: Secondary | ICD-10-CM | POA: Diagnosis not present

## 2019-08-05 DIAGNOSIS — F17210 Nicotine dependence, cigarettes, uncomplicated: Secondary | ICD-10-CM | POA: Diagnosis not present

## 2019-08-05 DIAGNOSIS — Z5111 Encounter for antineoplastic chemotherapy: Secondary | ICD-10-CM | POA: Diagnosis not present

## 2019-08-05 LAB — COMPREHENSIVE METABOLIC PANEL
ALT: 5 U/L (ref 0–44)
AST: 15 U/L (ref 15–41)
Albumin: 4 g/dL (ref 3.5–5.0)
Alkaline Phosphatase: 76 U/L (ref 38–126)
Anion gap: 7 (ref 5–15)
BUN: 14 mg/dL (ref 8–23)
CO2: 31 mmol/L (ref 22–32)
Calcium: 9.6 mg/dL (ref 8.9–10.3)
Chloride: 100 mmol/L (ref 98–111)
Creatinine, Ser: 0.67 mg/dL (ref 0.44–1.00)
GFR calc Af Amer: >60 mL/min (ref >60)
GFR calc non Af Amer: >60 mL/min (ref >60)
Glucose, Bld: 88 mg/dL (ref 70–99)
Potassium: 4 mmol/L (ref 3.5–5.1)
Sodium: 138 mmol/L (ref 135–145)
Total Bilirubin: 0.5 mg/dL (ref 0.3–1.2)
Total Protein: 7.5 g/dL (ref 6.5–8.1)

## 2019-08-05 LAB — CBC WITH DIFFERENTIAL/PLATELET
Abs Immature Granulocytes: 0.03 10*3/uL (ref 0.00–0.07)
Basophils Absolute: 0 10*3/uL (ref 0.0–0.1)
Basophils Relative: 1 %
Eosinophils Absolute: 0.7 10*3/uL — ABNORMAL HIGH (ref 0.0–0.5)
Eosinophils Relative: 8 %
HCT: 42.1 % (ref 36.0–46.0)
Hemoglobin: 13.3 g/dL (ref 12.0–15.0)
Immature Granulocytes: 0 %
Lymphocytes Relative: 14 %
Lymphs Abs: 1.2 10*3/uL (ref 0.7–4.0)
MCH: 28.9 pg (ref 26.0–34.0)
MCHC: 31.6 g/dL (ref 30.0–36.0)
MCV: 91.3 fL (ref 80.0–100.0)
Monocytes Absolute: 0.6 10*3/uL (ref 0.1–1.0)
Monocytes Relative: 8 %
Neutro Abs: 6 10*3/uL (ref 1.7–7.7)
Neutrophils Relative %: 69 %
Platelets: 325 10*3/uL (ref 150–400)
RBC: 4.61 MIL/uL (ref 3.87–5.11)
RDW: 15.2 % (ref 11.5–15.5)
WBC: 8.6 10*3/uL (ref 4.0–10.5)
nRBC: 0 % (ref 0.0–0.2)

## 2019-08-05 MED ORDER — DEXAMETHASONE SODIUM PHOSPHATE 10 MG/ML IJ SOLN
10.0000 mg | Freq: Once | INTRAMUSCULAR | Status: AC
  Administered 2019-08-05: 10 mg via INTRAVENOUS
  Filled 2019-08-05: qty 1

## 2019-08-05 MED ORDER — DIPHENHYDRAMINE HCL 50 MG/ML IJ SOLN
25.0000 mg | Freq: Once | INTRAMUSCULAR | Status: AC
  Administered 2019-08-05: 25 mg via INTRAVENOUS
  Filled 2019-08-05: qty 1

## 2019-08-05 MED ORDER — SODIUM CHLORIDE 0.9 % IV SOLN
Freq: Once | INTRAVENOUS | Status: AC
  Administered 2019-08-05: 11:00:00 via INTRAVENOUS
  Filled 2019-08-05: qty 250

## 2019-08-05 MED ORDER — SODIUM CHLORIDE 0.9 % IV SOLN: 10.0000 mg | Freq: Once | INTRAVENOUS | Status: DC

## 2019-08-05 MED ORDER — PALONOSETRON HCL INJECTION 0.25 MG/5ML
0.2500 mg | Freq: Once | INTRAVENOUS | Status: AC
  Administered 2019-08-05: 0.25 mg via INTRAVENOUS
  Filled 2019-08-05: qty 5

## 2019-08-05 MED ORDER — SODIUM CHLORIDE 0.9 % IV SOLN
45.0000 mg/m2 | Freq: Once | INTRAVENOUS | Status: AC
  Administered 2019-08-05: 90 mg via INTRAVENOUS
  Filled 2019-08-05: qty 15

## 2019-08-05 MED ORDER — SODIUM CHLORIDE 0.9 % IV SOLN
208.2000 mg | Freq: Once | INTRAVENOUS | Status: AC
  Administered 2019-08-05: 210 mg via INTRAVENOUS
  Filled 2019-08-05: qty 21

## 2019-08-05 MED ORDER — FAMOTIDINE IN NACL 20-0.9 MG/50ML-% IV SOLN
20.0000 mg | Freq: Once | INTRAVENOUS | Status: AC
  Administered 2019-08-05: 20 mg via INTRAVENOUS
  Filled 2019-08-05: qty 50

## 2019-08-06 ENCOUNTER — Telehealth: Payer: Self-pay

## 2019-08-06 ENCOUNTER — Other Ambulatory Visit: Payer: Self-pay

## 2019-08-06 ENCOUNTER — Ambulatory Visit
Admission: RE | Admit: 2019-08-06 | Discharge: 2019-08-06 | Disposition: A | Discharge status: Home / Self Care | Payer: Medicare HMO | Source: Ambulatory Visit | Attending: Radiation Oncology | Admitting: Radiation Oncology

## 2019-08-06 ENCOUNTER — Inpatient Hospital Stay (HOSPITAL_BASED_OUTPATIENT_CLINIC_OR_DEPARTMENT_OTHER): Payer: Medicare HMO | Admitting: Hospice and Palliative Medicine

## 2019-08-06 DIAGNOSIS — F17210 Nicotine dependence, cigarettes, uncomplicated: Secondary | ICD-10-CM | POA: Diagnosis not present

## 2019-08-06 DIAGNOSIS — Z510 Encounter for antineoplastic radiation therapy: Secondary | ICD-10-CM | POA: Diagnosis not present

## 2019-08-06 DIAGNOSIS — C3492 Malignant neoplasm of unspecified part of left bronchus or lung: Secondary | ICD-10-CM | POA: Diagnosis not present

## 2019-08-06 DIAGNOSIS — Z5111 Encounter for antineoplastic chemotherapy: Secondary | ICD-10-CM | POA: Diagnosis not present

## 2019-08-06 DIAGNOSIS — Z9981 Dependence on supplemental oxygen: Secondary | ICD-10-CM | POA: Diagnosis not present

## 2019-08-06 DIAGNOSIS — C3412 Malignant neoplasm of upper lobe, left bronchus or lung: Secondary | ICD-10-CM | POA: Diagnosis not present

## 2019-08-06 DIAGNOSIS — R0602 Shortness of breath: Secondary | ICD-10-CM | POA: Diagnosis not present

## 2019-08-06 NOTE — Progress Notes (Signed)
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 female who presents to chemo care clinic today for initial meeting in preparation for starting chemotherapy. I introduced the chemo care clinic and we discussed that the role of the clinic is to assist those who are at an increased risk of emergency room visits and/or complications during the course of chemotherapy treatment. We discussed that the increased risk takes into account factors such as age, performance status, and co-morbidities. We also discussed that for some, this might include barriers to care such as not having a primary care provider, lack of insurance/transportation, or not being able to afford medications. We discussed that the goal of the program is to help prevent unplanned ER visits and help reduce complications during chemotherapy. We do this by discussing specific risk factors to each individual and identifying ways that we can help improve these risk factors and reduce barriers to care.   Hematology/Oncology History:  Oncology History  Squamous cell carcinoma lung, left ([place])  07/26/2019 Initial Diagnosis   Squamous cell carcinoma lung, left ([place])   07/26/2019 Cancer Staging   Staging form: Lung, AJCC 8th Edition - Clinical stage from 07/26/2019: Stage IIB (cT3, cN0, cM0) - Signed by Lloyd Huger, MD on 07/26/2019   08/05/2019 -  Chemotherapy   The patient had palonosetron (ALOXI) injection 0.25 mg, 0.25 mg, Intravenous,  Once, 1 of 7 cycles Administration: 0.25 mg (08/05/2019) CARBOplatin (PARAPLATIN) 210 mg in  sodium chloride 0.9 % 250 mL chemo infusion, 210 mg (100 % of original dose 208.2 mg), Intravenous,  Once, 1 of 7 cycles Dose modification:   (original dose 208.2 mg, Cycle 1) Administration: 210 mg (08/05/2019) PACLitaxel (TAXOL) 90 mg in sodium chloride 0.9 % 250 mL chemo infusion (</= 80mg /m2), 45 mg/m2 = 90 mg, Intravenous,  Once, 1 of 7 cycles Administration: 90 mg (08/05/2019)  for chemotherapy treatment.       Allergies  Allergen Reactions  . Diclofenac-Misoprostol Other (See Comments)    Arthrotec - gastritis   . Nsaids Other (See Comments)    gastritis  . Zolpidem Other (See Comments)    Sleep walking   . Hydrocodone-Acetaminophen Nausea And Vomiting  . Simvastatin Other (See Comments)    body aches     Past Medical History:  Diagnosis Date  . Anxiety   . Asthma   . Cancer ([place]) 12/2018   w/u for right upper lobe mass/cancer  . COPD (chronic obstructive pulmonary disease) ([facility])    also, emphysema. now using o2 via np 24 hours a day  . Coronary artery disease   . Depression   . Diabetes mellitus, type II ([place])   . GERD (gastroesophageal reflux disease)   . Headache    migraines in early 20's  . Heart murmur   . Hypertension   . Lung cancer ([place])   . Neuropathy   . Restless leg      Past Surgical History:  Procedure Laterality Date  . BACK SURGERY  2005   surgery x 2, disc fused in neck, pinched nerve in center of back and neck  .  CARDIAC CATHETERIZATION  2015   1 stent placed for blockage  . cervical bone infusion    . CHOLECYSTECTOMY    . THORACIC DISC SURGERY    . TUBAL LIGATION    . VIDEO BRONCHOSCOPY WITH ENDOBRONCHIAL ULTRASOUND N/A 03/06/2019   Procedure: VIDEO BRONCHOSCOPY WITH ENDOBRONCHIAL ULTRASOUND - DIABETIC;  Surgeon: Ottie Glazier, MD;  Location: [facility] ORS;  Service: Thoracic;  Laterality: N/A;    Social History   Socioeconomic History  . Marital status: Married    Spouse name: Abe People  . Number of children: Not on file  . Years of  education: Not on file  . Highest education level: Not on file  Occupational History    Comment: disabled  Social Needs  . Financial resource strain: Not on file  . Food insecurity    Worry: Not on file    Inability: Not on file  . Transportation needs    Medical: Not on file    Non-medical: Not on file  Tobacco Use  . Smoking status: Current Every Day Smoker    Packs/day: 1.00    Years: 40.00    Pack years: 40.00    Types: Cigarettes    Start date: 05/05/1975  . Smokeless tobacco: Never Used  Substance and Sexual Activity  . Alcohol use: No    Alcohol/week: 0.0 standard drinks    Comment: socially  . Drug use: No  . Sexual activity: Not Currently  Lifestyle  . Physical activity    Days per week: Not on file    Minutes per session: Not on file  . Stress: Not on file  Relationships  . Social Herbalist on phone: Not on file    Gets together: Not on file    Attends religious service: Not on file    Active member of club or organization: Not on file    Attends meetings of clubs or organizations: Not on file    Relationship status: Not on file  . Intimate partner violence    Fear of current or ex partner: Not on file    Emotionally abused: Not on file    Physically abused: Not on file    Forced sexual activity: Not on file  Other Topics Concern  . Not on file  Social History Narrative  . Not on file    Family History  Problem Relation Age of Onset  . Anxiety disorder Mother   . Cancer - Lung Mother   . Depression Sister   . Cancer Sister   . Depression Brother   . Cancer Sister   . Cancer Sister   . Cancer Sister   . Heart Problems Sister   . Bipolar disorder Sister   . Diabetes Sister   . Cancer - Lung Sister   . Hypertension Sister   . Diabetes Sister   . Hyperlipidemia Sister   . COPD Sister   . Heart Problems Brother   . Cancer Brother   . Cancer Brother   . Cancer Brother   . Breast cancer Maternal Aunt     Current Outpatient  Medications  Medication Sig Dispense Refill  . ACCU-CHEK FASTCLIX LANCETS MISC     . ACCU-CHEK SMARTVIEW test strip     . albuterol (PROVENTIL HFA) 108 (90 BASE) MCG/ACT inhaler Inhale 2 puffs into the lungs every 4 (four) hours as needed for wheezing or shortness of breath.     Marland Kitchen albuterol (PROVENTIL) (2.5 MG/3ML) 0.083% nebulizer solution Take 2.5 mg  by nebulization every 6 (six) hours as needed for wheezing or shortness of breath.    Marland Kitchen amLODipine (NORVASC) 10 MG tablet Take 10 mg by mouth daily.     Marland Kitchen aspirin 81 MG chewable tablet Chew 1 tablet (81 mg total) by mouth daily. Continue holding now, as you are doing until procedure. (Patient taking differently: Chew 81 mg by mouth daily. ) 30 tablet 0  . carbidopa-levodopa (SINEMET IR) 25-100 MG tablet Take 1 tablet by mouth 3 (three) times daily.    . diclofenac sodium (VOLTAREN) 1 % GEL     . esomeprazole (NEXIUM) 40 MG capsule Take 40 mg by mouth daily.    . ferrous sulfate 325 (65 FE) MG tablet Take 1 tablet (325 mg total) by mouth 2 (two) times daily with a meal. 30 tablet 3  . fluticasone (FLONASE) 50 MCG/ACT nasal spray Place 2 sprays into both nostrils daily as needed for allergies.     . Fluticasone-Umeclidin-Vilant 100-62.5-25 MCG/INH AEPB Inhale 1 puff into the lungs daily. Treligy    . gabapentin (NEURONTIN) 100 MG capsule Take 1 capsule (100 mg total) by mouth 2 (two) times daily. QAM, QNOON (Patient taking differently: Take 200 mg by mouth daily. QAM) 180 capsule 2  . gabapentin (NEURONTIN) 400 MG capsule Take 1 capsule (400 mg total) by mouth 2 (two) times daily. 400 mg around 1300, and 2000 180 capsule 2  . ipratropium (ATROVENT) 0.02 % nebulizer solution Take 2.5 mLs (0.5 mg total) by nebulization every 6 (six) hours as needed for wheezing or shortness of breath. 75 mL 12  . metFORMIN (GLUCOPHAGE) 500 MG tablet Take 500 mg by mouth daily with breakfast.     . metoprolol tartrate (LOPRESSOR) 25 MG tablet Take 25 mg by mouth 2 (two)  times daily.     . Multiple Vitamin (MULTIVITAMIN) tablet Take 1 tablet by mouth daily.    . OXYGEN Inhale 2 L into the lungs continuous.    . potassium chloride (KLOR-CON) 8 MEQ tablet Take 8 mEq by mouth daily.     . pravastatin (PRAVACHOL) 80 MG tablet Take 80 mg by mouth every evening.     . prochlorperazine (COMPAZINE) 10 MG tablet Take 1 tablet (10 mg total) by mouth every 6 (six) hours as needed (Nausea or vomiting). 60 tablet 2  . solifenacin (VESICARE) 10 MG tablet Take 10 mg by mouth daily.    Marland Kitchen spironolactone (ALDACTONE) 50 MG tablet Take 50 mg by mouth daily.     Marland Kitchen venlafaxine XR (EFFEXOR-XR) 150 MG 24 hr capsule Take 1 capsule (150 mg total) by mouth daily with breakfast. 90 capsule 1   No current facility-administered medications for this visit.      PERFORMANCE STATUS (ECOG) : 1 - Symptomatic but completely ambulatory  Review of Systems As noted above. Otherwise, a complete review of systems is negative.  Physical Exam General: NAD, frail appearing Pulmonary: unlabored, on O2 Extremities: no edema, no joint deformities Skin: no rashes Neurological: Weakness but otherwise nonfocal   Assessment and Plan:    1. 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 Followed by Dr. Grayland Ormond.   2. High Risk for ER/Hospitalization during Chemotherapy: We discussed the role of the chemo care clinic and identified patient specific risk factors. I discussed that patient was identified as high risk primarily based on: COPD. We also discussed the role of the Symptom Management  and [place] [facility] and methods of contacting clinic/provider. She denies needing specific assistance at this time.  Current PCP: Tracie Harrier, MD  Hospital Admissions: 3   ED Visits: 1   Has Medicaid: No  Has Medicare: Yes  In relationship: Yes  Has Anemia: Yes  Has asthma: No  Has atrial  fibrillation: No  Has CVD: Yes  Has chronic kidney disease: No  Has Chronic Obstructive Pulmonary Disease: Yes  Has Congestive Heart Failure: No  Has Connective Tissue Disorder: No  Has Depression: Yes  Has Diabetes: Yes  Has liver disease: No  Has Peripheral Vascular Disease: No     3. Social Determinants of Health:   Housing - Patient lives at home with her husband.   Food -no concerns  Transportation -patient's husband primarily provides transportation to and from [place] appointments.  Patient is still able to drive.  Utilities -no concerns  Safety -no concerns  Financial Strain -patient is disabled and lives on a fixed income.  She would be interested in speaking with social work at some point to discuss financial resources.  Employment -patient previously worked in a [employer].  She has been disabled for many years.  Family/Community Support -patient reports that she has good family support   4. Co-morbidities Complicating Care: Patient has oxygen dependent COPD.  She does have exertional dyspnea but is functionally independent with her own care.  She is followed by Dr. Raul Del with pulmonary.  Patient also has regular follow-up with her PCP, Dr. Ginette Pitman.  Patient reports that she has all of her medications available at home and takes them as directed.  She has no recent hospitalization for COPD exacerbation.    Patient expressed understanding and was in agreement with this plan. She also understands that She can call clinic at any time with any questions, concerns, or complaints.   A total of (15) minutes of face-to-face time was spent with this patient with greater than 50% of that time in counseling and care-coordination.   Signed by: Altha Harm, PhD, DNP, NP-C, [facility] [phone] (Work Cell)

## 2019-08-06 NOTE — Telephone Encounter (Signed)
Telephone call to patient for follow up after receiving first infusion yesterday.   Patient states feeling "great".  States just got home from radiation and mopping the floors.  States eating well and drinking plenty of fluids.  Encouraged patient to call for any questions or concerns.

## 2019-08-07 ENCOUNTER — Ambulatory Visit
Admission: RE | Admit: 2019-08-07 | Discharge: 2019-08-07 | Disposition: A | Discharge status: Home / Self Care | Payer: Medicare HMO | Source: Ambulatory Visit | Attending: Radiation Oncology | Admitting: Radiation Oncology

## 2019-08-07 ENCOUNTER — Other Ambulatory Visit: Payer: Self-pay

## 2019-08-07 DIAGNOSIS — Z510 Encounter for antineoplastic radiation therapy: Secondary | ICD-10-CM | POA: Diagnosis not present

## 2019-08-07 DIAGNOSIS — C3412 Malignant neoplasm of upper lobe, left bronchus or lung: Secondary | ICD-10-CM | POA: Diagnosis not present

## 2019-08-09 NOTE — Progress Notes (Signed)
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  INTERVAL HISTORY: Patient returns to clinic today for further evaluation and consideration of cycle 2 of weekly carboplatinum and Taxol.  She tolerated her first infusion well without significant side effects.  She is tolerating daily XRT. She has chronic shortness of breath and requires oxygen 24 hours a day.  She has no neurologic complaints.  She denies any recent fevers or illnesses.  She has a good appetite and denies weight loss.  She denies any pain.  She has no chest pain, cough, or hemoptysis.  She denies any nausea, vomiting, constipation, or diarrhea.  She has no urinary complaints.  Patient offers no further specific complaints today.  REVIEW OF SYSTEMS:   Review of Systems  Constitutional: Negative.  Negative for fever, malaise/fatigue and weight loss.  Respiratory: Positive for shortness of breath. Negative for cough and hemoptysis.   Cardiovascular: Negative.  Negative for chest pain and leg swelling.  Gastrointestinal: Negative.  Negative for abdominal pain.  Genitourinary: Negative.  Negative for dysuria.  Musculoskeletal: Negative.  Negative for back pain.  Skin: Negative.  Negative for rash.  Neurological: Negative.  Negative for dizziness, weakness and headaches.  Psychiatric/Behavioral: Negative.  The patient is not nervous/anxious.     As per HPI. Otherwise, a complete review of systems is negative.  PAST MEDICAL HISTORY: Past Medical History:  Diagnosis Date  . Anxiety   . Asthma   . Cancer ([place]) 12/2018   w/u for right upper lobe mass/cancer  . COPD (chronic obstructive pulmonary disease) ([facility])    also, emphysema. now using o2 via np 24 hours a day  .  Coronary artery disease   . Depression   . Diabetes mellitus, type II ([place])   . GERD (gastroesophageal reflux disease)   . Headache    migraines in early 20's  . Heart murmur   . Hypertension   . Lung cancer ([place])   . Neuropathy   . Restless leg     PAST SURGICAL HISTORY: Past Surgical History:  Procedure Laterality Date  . BACK SURGERY  2005   surgery x 2, disc fused in neck, pinched nerve in center of back and neck  . CARDIAC CATHETERIZATION  2015   1 stent placed for blockage  . cervical bone infusion    . CHOLECYSTECTOMY    . THORACIC DISC SURGERY    . TUBAL LIGATION    . VIDEO BRONCHOSCOPY WITH ENDOBRONCHIAL ULTRASOUND N/A 03/06/2019   Procedure: VIDEO BRONCHOSCOPY WITH ENDOBRONCHIAL ULTRASOUND - DIABETIC;  Surgeon: Ottie Glazier, MD;  Location: [facility] ORS;  Service: Thoracic;  Laterality: N/A;    FAMILY HISTORY: Family History  Problem Relation Age of Onset  . Anxiety disorder Mother   . Cancer - Lung Mother   . Depression Sister   . Cancer Sister   . Depression Brother   . Cancer Sister   . Cancer Sister   . Cancer Sister   . Heart Problems Sister   . Bipolar disorder Sister   . Diabetes Sister   . Cancer - Lung Sister   . Hypertension Sister   . Diabetes Sister   . Hyperlipidemia Sister   . COPD Sister   . Heart Problems Brother   . Cancer Brother   .  Cancer Brother   . Cancer Brother   . Breast cancer Maternal Aunt     ADVANCED DIRECTIVES (Y/N):  N  HEALTH MAINTENANCE: Social History   Tobacco Use  . Smoking status: Current Every Day Smoker    Packs/day: 1.00    Years: 40.00    Pack years: 40.00    Types: Cigarettes    Start date: 05/05/1975  . Smokeless tobacco: Never Used  Substance Use Topics  . Alcohol use: No    Alcohol/week: 0.0 standard drinks    Comment: socially  . Drug use: No     Colonoscopy:  PAP:  Bone density:  Lipid panel:  Allergies  Allergen Reactions  . Diclofenac-Misoprostol Other (See Comments)    Arthrotec -  gastritis   . Nsaids Other (See Comments)    gastritis  . Zolpidem Other (See Comments)    Sleep walking   . Hydrocodone-Acetaminophen Nausea And Vomiting  . Simvastatin Other (See Comments)    body aches    Current Outpatient Medications  Medication Sig Dispense Refill  . ACCU-CHEK FASTCLIX LANCETS MISC     . ACCU-CHEK SMARTVIEW test strip     . albuterol (PROVENTIL HFA) 108 (90 BASE) MCG/ACT inhaler Inhale 2 puffs into the lungs every 4 (four) hours as needed for wheezing or shortness of breath.     Marland Kitchen albuterol (PROVENTIL) (2.5 MG/3ML) 0.083% nebulizer solution Take 2.5 mg by nebulization every 6 (six) hours as needed for wheezing or shortness of breath.    Marland Kitchen amLODipine (NORVASC) 10 MG tablet Take 10 mg by mouth daily.     Marland Kitchen aspirin 81 MG chewable tablet Chew 1 tablet (81 mg total) by mouth daily. Continue holding now, as you are doing until procedure. (Patient taking differently: Chew 81 mg by mouth daily. ) 30 tablet 0  . carbidopa-levodopa (SINEMET IR) 25-100 MG tablet Take 1 tablet by mouth 3 (three) times daily.    . diclofenac sodium (VOLTAREN) 1 % GEL     . esomeprazole (NEXIUM) 40 MG capsule Take 40 mg by mouth daily.    . ferrous sulfate 325 (65 FE) MG tablet Take 1 tablet (325 mg total) by mouth 2 (two) times daily with a meal. 30 tablet 3  . fluticasone (FLONASE) 50 MCG/ACT nasal spray Place 2 sprays into both nostrils daily as needed for allergies.     . Fluticasone-Umeclidin-Vilant 100-62.5-25 MCG/INH AEPB Inhale 1 puff into the lungs daily. Treligy    . gabapentin (NEURONTIN) 100 MG capsule Take 1 capsule (100 mg total) by mouth 2 (two) times daily. QAM, QNOON (Patient taking differently: Take 200 mg by mouth daily. QAM) 180 capsule 2  . gabapentin (NEURONTIN) 400 MG capsule Take 1 capsule (400 mg total) by mouth 2 (two) times daily. 400 mg around 1300, and 2000 180 capsule 2  . ipratropium (ATROVENT) 0.02 % nebulizer solution Take 2.5 mLs (0.5 mg total) by nebulization  every 6 (six) hours as needed for wheezing or shortness of breath. 75 mL 12  . metFORMIN (GLUCOPHAGE) 500 MG tablet Take 500 mg by mouth daily with breakfast.     . metoprolol tartrate (LOPRESSOR) 25 MG tablet Take 25 mg by mouth 2 (two) times daily.     . Multiple Vitamin (MULTIVITAMIN) tablet Take 1 tablet by mouth daily.    . OXYGEN Inhale 2 L into the lungs continuous.    . potassium chloride (KLOR-CON) 8 MEQ tablet Take 8 mEq by mouth daily.     . pravastatin (  PRAVACHOL) 80 MG tablet Take 80 mg by mouth every evening.     . prochlorperazine (COMPAZINE) 10 MG tablet Take 1 tablet (10 mg total) by mouth every 6 (six) hours as needed (Nausea or vomiting). 60 tablet 2  . solifenacin (VESICARE) 10 MG tablet Take 10 mg by mouth daily.    Marland Kitchen spironolactone (ALDACTONE) 50 MG tablet Take 50 mg by mouth daily.     Marland Kitchen venlafaxine XR (EFFEXOR-XR) 150 MG 24 hr capsule Take 1 capsule (150 mg total) by mouth daily with breakfast. 90 capsule 1  . ondansetron (ZOFRAN) 8 MG tablet Take 1 tablet (8 mg total) by mouth 2 (two) times daily. 60 tablet 0   No current facility-administered medications for this visit.     OBJECTIVE: Vitals:   08/12/19 1008  BP: (!) 151/84  Pulse: 88  Resp: 18  Temp: (!) 97.5 F (36.4 C)  SpO2: 97%     Body mass index is 35.8 kg/m.    ECOG FS:0 - Asymptomatic  General: Well-developed, well-nourished, no acute distress. Eyes: Pink conjunctiva, anicteric sclera. HEENT: Normocephalic, moist mucous membranes. Lungs: Clear to auscultation bilaterally. Heart: Regular rate and rhythm. No rubs, murmurs, or gallops. Abdomen: Soft, nontender, nondistended. No organomegaly noted, normoactive bowel sounds. Musculoskeletal: No edema, cyanosis, or clubbing. Neuro: Alert, answering all questions appropriately. Cranial nerves grossly intact. Skin: No rashes or petechiae noted. Psych: Normal affect.  LAB RESULTS:  Lab Results  Component Value Date   NA 136 08/12/2019   K 3.9  08/12/2019   CL 97 (L) 08/12/2019   CO2 30 08/12/2019   GLUCOSE 161 (H) 08/12/2019   BUN 14 08/12/2019   CREATININE 0.73 08/12/2019   CALCIUM 9.0 08/12/2019   PROT 6.8 08/12/2019   ALBUMIN 3.9 08/12/2019   AST 14 (L) 08/12/2019   ALT 16 08/12/2019   ALKPHOS 76 08/12/2019   BILITOT 0.4 08/12/2019   GFRNONAA >60 08/12/2019   GFRAA >60 08/12/2019    Lab Results  Component Value Date   WBC 8.1 08/12/2019   NEUTROABS 6.0 08/12/2019   HGB 12.4 08/12/2019   HCT 38.2 08/12/2019   MCV 91.4 08/12/2019   PLT 330 08/12/2019     STUDIES: Mr Jeri Cos YI Contrast  Result Date: 07/22/2019 CLINICAL DATA:  New diagnosis lung cancer.  Headache.  Staging. EXAM: MRI HEAD WITHOUT AND WITH CONTRAST TECHNIQUE: Multiplanar, multiecho pulse sequences of the brain and surrounding structures were obtained without and with intravenous contrast. CONTRAST:  48mL GADAVIST GADOBUTROL 1 MMOL/ML IV SOLN COMPARISON:  None. FINDINGS: Brain: Ventricle size and cerebral volume normal. Diffuse white matter hypodensity similar to the prior CT. Moderate hyperintensity in the pons. No acute infarct. Negative for hemorrhage or mass. No enhancing lesions identified. Negative for metastatic disease. Vascular: Normal arterial flow voids. Skull and upper cervical spine: Negative Sinuses/Orbits: Negative Other: None IMPRESSION: Negative for metastatic disease Moderate to extensive changes in the white matter and cerebellum. This is likely due to chronic microvascular ischemia. Correlate with risk factors. Electronically Signed   By: Franchot Gallo M.D.   On: 07/22/2019 15:39   Ct Biopsy  Result Date: 07/16/2019 INDICATION: Enlarging hypermetabolic left upper lobe pulmonary mass. Please perform CT-guided biopsy for tissue diagnostic purposes. EXAM: CT GUIDED LEFT UPPER LOBE PULMONARY MASS BIOPSY COMPARISON:  PET-CT-07/07/2019; CHEST CT-04/09/2019; 12/17/2018 MEDICATIONS: None. ANESTHESIA/SEDATION: Fentanyl 50 mcg IV; Versed 1 mg IV  Sedation time: 15 minutes; The patient was continuously monitored during the procedure by the interventional radiology nurse under my  direct supervision. CONTRAST:  None COMPLICATIONS: None immediate. PROCEDURE: Informed consent was obtained from the patient following an explanation of the procedure, risks, benefits and alternatives. The patient understands,agrees and consents for the procedure. All questions were addressed. A time out was performed prior to the initiation of the procedure. The patient was positioned supine on the CT table and a limited chest CT was performed for procedural planning demonstrating unchanged size and appearance of the known hypermetabolic macrolobulated left upper lobe pulmonary nodule with dominant component measuring approximately 6.7 x 5.3 cm (image 9, series 2). The operative site was prepped and draped in the usual sterile fashion. Under sterile conditions and local anesthesia, a 17 gauge coaxial needle was advanced into the peripheral aspect of the nodule. Positioning was confirmed with intermittent CT fluoroscopy and followed by the acquisition of 5 core needle biopsy samples with an 18 gauge core needle biopsy device. The coaxial needle was removed and superficial hemostasis was achieved with manual compression. Limited post procedural chest CT was negative for pneumothorax or additional complication. A dressing was placed. The patient tolerated the procedure well without immediate postprocedural complication. The patient was escorted to have an upright chest radiograph. IMPRESSION: Technically successful CT guided core needle core biopsy of enlarging hypermetabolic left upper lobe pulmonary mass. Electronically Signed   By: Sandi Mariscal M.D.   On: 07/16/2019 11:12   Dg Chest Port 1 View  Result Date: 07/16/2019 CLINICAL DATA:  Lung biopsy EXAM: PORTABLE CHEST 1 VIEW COMPARISON:  CT fluoroscopy from the same day FINDINGS: Known left lung mass contacting the left heart  border. No evidence of pneumothorax or bleeding. Normal heart size. No consolidation or pleural fluid. IMPRESSION: No evidence of complication after left lung mass biopsy. Electronically Signed   By: Monte Fantasia M.D.   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  PLAN:    1. Squamous cell carcinoma of the upper lobe of left lung: Imaging results reviewed independently and it was agreed upon the patient's final staging is considered IIb.  MRI of the brain on July 22, 2019 was reported as negative. She will benefit from current chemotherapy using weekly carboplatinum and Taxol along with daily XRT.  Because she is only stage II, she does not qualify for maintenance durvalumab.  Proceed with cycle 2 of weekly carboplatinum and Taxol today.  Continue daily XRT.  Return to clinic in 1 week for further evaluation and consideration of cycle 3.   2.  Shortness of breath: Continue oxygen as prescribed.  I spent a total of 30 minutes face-to-face with the patient of which greater than 50% of the visit was spent in counseling and coordination of care as detailed above.   Patient expressed understanding and was in agreement with this plan. She also understands that She can call clinic at any time with any questions, concerns, or complaints.   Cancer Staging Squamous cell carcinoma lung, left ([place]) Staging form: Lung, AJCC 8th Edition - Clinical stage from 07/26/2019: Stage IIB (cT3, cN0, cM0) - Signed by Lloyd Huger, MD on 07/26/2019   Lloyd Huger, MD   08/13/2019 12:53 PM

## 2019-08-10 ENCOUNTER — Ambulatory Visit
Admission: RE | Admit: 2019-08-10 | Discharge: 2019-08-10 | Disposition: A | Discharge status: Home / Self Care | Payer: Medicare HMO | Source: Ambulatory Visit | Attending: Radiation Oncology | Admitting: Radiation Oncology

## 2019-08-10 ENCOUNTER — Other Ambulatory Visit: Payer: Self-pay

## 2019-08-10 DIAGNOSIS — Z510 Encounter for antineoplastic radiation therapy: Secondary | ICD-10-CM | POA: Diagnosis not present

## 2019-08-10 DIAGNOSIS — C3412 Malignant neoplasm of upper lobe, left bronchus or lung: Secondary | ICD-10-CM | POA: Diagnosis not present

## 2019-08-11 ENCOUNTER — Encounter: Payer: Self-pay | Admitting: Oncology

## 2019-08-11 ENCOUNTER — Other Ambulatory Visit: Payer: Self-pay

## 2019-08-11 ENCOUNTER — Ambulatory Visit
Admission: RE | Admit: 2019-08-11 | Discharge: 2019-08-11 | Disposition: A | Discharge status: Home / Self Care | Payer: Medicare HMO | Source: Ambulatory Visit | Attending: Radiation Oncology | Admitting: Radiation Oncology

## 2019-08-11 DIAGNOSIS — Z510 Encounter for antineoplastic radiation therapy: Secondary | ICD-10-CM | POA: Diagnosis not present

## 2019-08-11 DIAGNOSIS — C3412 Malignant neoplasm of upper lobe, left bronchus or lung: Secondary | ICD-10-CM | POA: Diagnosis not present

## 2019-08-11 NOTE — Progress Notes (Signed)
Patient pre screened for office appointment, no questions or concerns today. 

## 2019-08-12 ENCOUNTER — Ambulatory Visit
Admission: RE | Admit: 2019-08-12 | Discharge: 2019-08-12 | Disposition: A | Discharge status: Home / Self Care | Payer: Medicare HMO | Source: Ambulatory Visit | Attending: Radiation Oncology | Admitting: Radiation Oncology

## 2019-08-12 ENCOUNTER — Inpatient Hospital Stay (HOSPITAL_BASED_OUTPATIENT_CLINIC_OR_DEPARTMENT_OTHER): Payer: Medicare HMO | Admitting: Oncology

## 2019-08-12 ENCOUNTER — Inpatient Hospital Stay: Payer: Medicare HMO

## 2019-08-12 ENCOUNTER — Other Ambulatory Visit: Payer: Self-pay

## 2019-08-12 VITALS — BP 151/84 | HR 88 | Temp 97.5°F | Resp 18 | Wt 202.1 lb

## 2019-08-12 DIAGNOSIS — C3492 Malignant neoplasm of unspecified part of left bronchus or lung: Secondary | ICD-10-CM

## 2019-08-12 DIAGNOSIS — Z9981 Dependence on supplemental oxygen: Secondary | ICD-10-CM | POA: Diagnosis not present

## 2019-08-12 DIAGNOSIS — C3412 Malignant neoplasm of upper lobe, left bronchus or lung: Secondary | ICD-10-CM | POA: Diagnosis not present

## 2019-08-12 DIAGNOSIS — Z510 Encounter for antineoplastic radiation therapy: Secondary | ICD-10-CM | POA: Diagnosis not present

## 2019-08-12 DIAGNOSIS — R0602 Shortness of breath: Secondary | ICD-10-CM | POA: Diagnosis not present

## 2019-08-12 DIAGNOSIS — F17210 Nicotine dependence, cigarettes, uncomplicated: Secondary | ICD-10-CM | POA: Diagnosis not present

## 2019-08-12 DIAGNOSIS — Z5111 Encounter for antineoplastic chemotherapy: Secondary | ICD-10-CM | POA: Diagnosis not present

## 2019-08-12 LAB — CBC WITH DIFFERENTIAL/PLATELET
Abs Immature Granulocytes: 0.07 10*3/uL (ref 0.00–0.07)
Basophils Absolute: 0 10*3/uL (ref 0.0–0.1)
Basophils Relative: 0 %
Eosinophils Absolute: 0.7 10*3/uL — ABNORMAL HIGH (ref 0.0–0.5)
Eosinophils Relative: 8 %
HCT: 38.2 % (ref 36.0–46.0)
Hemoglobin: 12.4 g/dL (ref 12.0–15.0)
Immature Granulocytes: 1 %
Lymphocytes Relative: 11 %
Lymphs Abs: 0.9 10*3/uL (ref 0.7–4.0)
MCH: 29.7 pg (ref 26.0–34.0)
MCHC: 32.5 g/dL (ref 30.0–36.0)
MCV: 91.4 fL (ref 80.0–100.0)
Monocytes Absolute: 0.5 10*3/uL (ref 0.1–1.0)
Monocytes Relative: 7 %
Neutro Abs: 6 10*3/uL (ref 1.7–7.7)
Neutrophils Relative %: 73 %
Platelets: 330 10*3/uL (ref 150–400)
RBC: 4.18 MIL/uL (ref 3.87–5.11)
RDW: 14.9 % (ref 11.5–15.5)
WBC: 8.1 10*3/uL (ref 4.0–10.5)
nRBC: 0 % (ref 0.0–0.2)

## 2019-08-12 LAB — COMPREHENSIVE METABOLIC PANEL
ALT: 16 U/L (ref 0–44)
AST: 14 U/L — ABNORMAL LOW (ref 15–41)
Albumin: 3.9 g/dL (ref 3.5–5.0)
Alkaline Phosphatase: 76 U/L (ref 38–126)
Anion gap: 9 (ref 5–15)
BUN: 14 mg/dL (ref 8–23)
CO2: 30 mmol/L (ref 22–32)
Calcium: 9 mg/dL (ref 8.9–10.3)
Chloride: 97 mmol/L — ABNORMAL LOW (ref 98–111)
Creatinine, Ser: 0.73 mg/dL (ref 0.44–1.00)
GFR calc Af Amer: >60 mL/min (ref >60)
GFR calc non Af Amer: >60 mL/min (ref >60)
Glucose, Bld: 161 mg/dL — ABNORMAL HIGH (ref 70–99)
Potassium: 3.9 mmol/L (ref 3.5–5.1)
Sodium: 136 mmol/L (ref 135–145)
Total Bilirubin: 0.4 mg/dL (ref 0.3–1.2)
Total Protein: 6.8 g/dL (ref 6.5–8.1)

## 2019-08-12 MED ORDER — ONDANSETRON HCL 8 MG PO TABS: 8.0000 mg | ORAL_TABLET | Freq: Two times a day (BID) | ORAL | 0 refills | Status: DC

## 2019-08-12 MED ORDER — PALONOSETRON HCL INJECTION 0.25 MG/5ML
0.2500 mg | Freq: Once | INTRAVENOUS | Status: AC
  Administered 2019-08-12: 11:00:00 0.25 mg via INTRAVENOUS
  Filled 2019-08-12: qty 5

## 2019-08-12 MED ORDER — DEXAMETHASONE SODIUM PHOSPHATE 10 MG/ML IJ SOLN: 10.0000 mg | Freq: Once | INTRAMUSCULAR | Status: DC

## 2019-08-12 MED ORDER — DIPHENHYDRAMINE HCL 50 MG/ML IJ SOLN
25.0000 mg | Freq: Once | INTRAMUSCULAR | Status: AC
  Administered 2019-08-12: 11:00:00 25 mg via INTRAVENOUS
  Filled 2019-08-12: qty 1

## 2019-08-12 MED ORDER — SODIUM CHLORIDE 0.9 % IV SOLN
208.2000 mg | Freq: Once | INTRAVENOUS | Status: AC
  Administered 2019-08-12: 13:00:00 210 mg via INTRAVENOUS
  Filled 2019-08-12: qty 21

## 2019-08-12 MED ORDER — SODIUM CHLORIDE 0.9 % IV SOLN
Freq: Once | INTRAVENOUS | Status: AC
  Administered 2019-08-12: 11:00:00 via INTRAVENOUS
  Filled 2019-08-12: qty 250

## 2019-08-12 MED ORDER — DEXAMETHASONE SODIUM PHOSPHATE 10 MG/ML IJ SOLN
10.0000 mg | Freq: Once | INTRAMUSCULAR | Status: AC
  Administered 2019-08-12: 10 mg via INTRAVENOUS
  Filled 2019-08-12: qty 1

## 2019-08-12 MED ORDER — SODIUM CHLORIDE 0.9 % IV SOLN
45.0000 mg/m2 | Freq: Once | INTRAVENOUS | Status: AC
  Administered 2019-08-12: 12:00:00 90 mg via INTRAVENOUS
  Filled 2019-08-12: qty 15

## 2019-08-12 MED ORDER — FAMOTIDINE IN NACL 20-0.9 MG/50ML-% IV SOLN
20.0000 mg | Freq: Once | INTRAVENOUS | Status: AC
  Administered 2019-08-12: 11:00:00 20 mg via INTRAVENOUS
  Filled 2019-08-12: qty 50

## 2019-08-13 ENCOUNTER — Ambulatory Visit
Admission: RE | Admit: 2019-08-13 | Discharge: 2019-08-13 | Disposition: A | Discharge status: Home / Self Care | Payer: Medicare HMO | Source: Ambulatory Visit | Attending: Radiation Oncology | Admitting: Radiation Oncology

## 2019-08-13 ENCOUNTER — Other Ambulatory Visit: Payer: Self-pay

## 2019-08-13 DIAGNOSIS — C3412 Malignant neoplasm of upper lobe, left bronchus or lung: Secondary | ICD-10-CM | POA: Diagnosis not present

## 2019-08-13 DIAGNOSIS — Z510 Encounter for antineoplastic radiation therapy: Secondary | ICD-10-CM | POA: Diagnosis not present

## 2019-08-14 ENCOUNTER — Other Ambulatory Visit: Payer: Self-pay

## 2019-08-14 ENCOUNTER — Ambulatory Visit
Admission: RE | Admit: 2019-08-14 | Discharge: 2019-08-14 | Disposition: A | Discharge status: Home / Self Care | Payer: Medicare HMO | Source: Ambulatory Visit | Attending: Radiation Oncology | Admitting: Radiation Oncology

## 2019-08-14 DIAGNOSIS — C3412 Malignant neoplasm of upper lobe, left bronchus or lung: Secondary | ICD-10-CM | POA: Diagnosis not present

## 2019-08-14 DIAGNOSIS — Z510 Encounter for antineoplastic radiation therapy: Secondary | ICD-10-CM | POA: Diagnosis not present

## 2019-08-16 NOTE — Progress Notes (Signed)
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  INTERVAL HISTORY: Patient returns to clinic today for further evaluation and consideration of cycle 3 of weekly carboplatinum and Taxol.  She continues to tolerate her infusions well without significant side effects.  She continues to tolerate her daily XRT.  She has chronic shortness of breath and requires oxygen 24 hours a day.  She has no neurologic complaints.  She denies any recent fevers or illnesses.  She has a good appetite and denies weight loss.  She denies any pain.  She has no chest pain, cough, or hemoptysis.  She denies any nausea, vomiting, constipation, or diarrhea.  She has no urinary complaints.  Patient feels at her baseline offers no further specific complaints today.  REVIEW OF SYSTEMS:   Review of Systems  Constitutional: Negative.  Negative for fever, malaise/fatigue and weight loss.  Respiratory: Positive for shortness of breath. Negative for cough and hemoptysis.   Cardiovascular: Negative.  Negative for chest pain and leg swelling.  Gastrointestinal: Negative.  Negative for abdominal pain.  Genitourinary: Negative.  Negative for dysuria.  Musculoskeletal: Negative.  Negative for back pain.  Skin: Negative.  Negative for rash.  Neurological: Negative.  Negative for dizziness, weakness and headaches.  Psychiatric/Behavioral: Negative.  The patient is not nervous/anxious.     As per HPI. Otherwise, a complete review of systems is negative.  PAST MEDICAL HISTORY: Past Medical History:  Diagnosis Date   Anxiety    Asthma    Cancer ([place]) 12/2018   w/u for right upper lobe mass/cancer   COPD (chronic obstructive pulmonary disease) (HCC)    also,  emphysema. now using o2 via np 24 hours a day   Coronary artery disease    Depression    Diabetes mellitus, type II (HCC)    GERD (gastroesophageal reflux disease)    Headache    migraines in early 20's   Heart murmur    Hypertension    Lung cancer ([place])    Neuropathy    Restless leg     PAST SURGICAL HISTORY: Past Surgical History:  Procedure Laterality Date   BACK SURGERY  2005   surgery x 2, disc fused in neck, pinched nerve in center of back and neck   CARDIAC CATHETERIZATION  2015   1 stent placed for blockage   cervical bone infusion     CHOLECYSTECTOMY     THORACIC DISC SURGERY     TUBAL LIGATION     VIDEO BRONCHOSCOPY WITH ENDOBRONCHIAL ULTRASOUND N/A 03/06/2019   Procedure: VIDEO BRONCHOSCOPY WITH ENDOBRONCHIAL ULTRASOUND - DIABETIC;  Surgeon: Ottie Glazier, MD;  Location: [facility] ORS;  Service: Thoracic;  Laterality: N/A;    FAMILY HISTORY: Family History  Problem Relation Age of Onset   Anxiety disorder Mother    Cancer - Lung Mother    Depression Sister    Cancer Sister    Depression Brother    Cancer Sister    Cancer Sister    Cancer Sister    Heart Problems Sister    Bipolar disorder Sister    Diabetes Sister    Cancer - Lung Sister    Hypertension Sister    Diabetes Sister    Hyperlipidemia Sister    COPD Sister  Heart Problems Brother    Cancer Brother    Cancer Brother    Cancer Brother    Breast cancer Maternal Aunt     ADVANCED DIRECTIVES (Y/N):  N  HEALTH MAINTENANCE: Social History   Tobacco Use   Smoking status: Current Every Day Smoker    Packs/day: 1.00    Years: 40.00    Pack years: 40.00    Types: Cigarettes    Start date: 05/05/1975   Smokeless tobacco: Never Used  Substance Use Topics   Alcohol use: No    Alcohol/week: 0.0 standard drinks    Comment: socially   Drug use: No     Colonoscopy:  PAP:  Bone density:  Lipid panel:  Allergies  Allergen Reactions    Diclofenac-Misoprostol Other (See Comments)    Arthrotec - gastritis    Nsaids Other (See Comments)    gastritis   Zolpidem Other (See Comments)    Sleep walking    Hydrocodone-Acetaminophen Nausea And Vomiting   Simvastatin Other (See Comments)    body aches    Current Outpatient Medications  Medication Sig Dispense Refill   ACCU-CHEK FASTCLIX LANCETS MISC      ACCU-CHEK SMARTVIEW test strip      albuterol (PROVENTIL HFA) 108 (90 BASE) MCG/ACT inhaler Inhale 2 puffs into the lungs every 4 (four) hours as needed for wheezing or shortness of breath.      albuterol (PROVENTIL) (2.5 MG/3ML) 0.083% nebulizer solution Take 2.5 mg by nebulization every 6 (six) hours as needed for wheezing or shortness of breath.     amLODipine (NORVASC) 10 MG tablet Take 10 mg by mouth daily.      aspirin 81 MG chewable tablet Chew 1 tablet (81 mg total) by mouth daily. Continue holding now, as you are doing until procedure. (Patient taking differently: Chew 81 mg by mouth daily. ) 30 tablet 0   carbidopa-levodopa (SINEMET IR) 25-100 MG tablet Take 1 tablet by mouth 3 (three) times daily.     diclofenac sodium (VOLTAREN) 1 % GEL      esomeprazole (NEXIUM) 40 MG capsule Take 40 mg by mouth daily.     ferrous sulfate 325 (65 FE) MG tablet Take 1 tablet (325 mg total) by mouth 2 (two) times daily with a meal. 30 tablet 3   fluticasone (FLONASE) 50 MCG/ACT nasal spray Place 2 sprays into both nostrils daily as needed for allergies.      Fluticasone-Umeclidin-Vilant 100-62.5-25 MCG/INH AEPB Inhale 1 puff into the lungs daily. Treligy     gabapentin (NEURONTIN) 100 MG capsule Take 1 capsule (100 mg total) by mouth 2 (two) times daily. QAM, QNOON (Patient taking differently: Take 200 mg by mouth daily. QAM) 180 capsule 2   gabapentin (NEURONTIN) 400 MG capsule Take 1 capsule (400 mg total) by mouth 2 (two) times daily. 400 mg around 1300, and 2000 180 capsule 2   ipratropium (ATROVENT) 0.02 %  nebulizer solution Take 2.5 mLs (0.5 mg total) by nebulization every 6 (six) hours as needed for wheezing or shortness of breath. 75 mL 12   metFORMIN (GLUCOPHAGE) 500 MG tablet Take 500 mg by mouth daily with breakfast.      metoprolol tartrate (LOPRESSOR) 25 MG tablet Take 25 mg by mouth 2 (two) times daily.      Multiple Vitamin (MULTIVITAMIN) tablet Take 1 tablet by mouth daily.     ondansetron (ZOFRAN) 8 MG tablet Take 1 tablet (8 mg total) by mouth 2 (two) times daily. 60 tablet  0   OXYGEN Inhale 2 L into the lungs continuous.     potassium chloride (KLOR-CON) 8 MEQ tablet Take 8 mEq by mouth daily.      pravastatin (PRAVACHOL) 80 MG tablet Take 80 mg by mouth every evening.      prochlorperazine (COMPAZINE) 10 MG tablet Take 1 tablet (10 mg total) by mouth every 6 (six) hours as needed (Nausea or vomiting). 60 tablet 2   solifenacin (VESICARE) 10 MG tablet Take 10 mg by mouth daily.     spironolactone (ALDACTONE) 50 MG tablet Take 50 mg by mouth daily.      venlafaxine XR (EFFEXOR-XR) 150 MG 24 hr capsule Take 1 capsule (150 mg total) by mouth daily with breakfast. 90 capsule 1   sucralfate (CARAFATE) 1 g tablet Take 1 tablet (1 g total) by mouth 3 (three) times daily. Dissolve in 3-4 tbsp warm water, swish and swallow 90 tablet 3   No current facility-administered medications for this visit.    Facility-Administered Medications Ordered in Other Visits  Medication Dose Route Frequency Provider Last Rate Last Dose   CARBOplatin (PARAPLATIN) 210 mg in sodium chloride 0.9 % 250 mL chemo infusion  210 mg Intravenous Once Lloyd Huger, MD       dexamethasone (DECADRON) injection 10 mg  10 mg Intravenous Once Lloyd Huger, MD       famotidine (PEPCID) IVPB 20 mg premix  20 mg Intravenous Once Lloyd Huger, MD 200 mL/hr at 08/19/19 1055 20 mg at 08/19/19 1055   PACLitaxel (TAXOL) 90 mg in sodium chloride 0.9 % 250 mL chemo infusion (</= 80mg /m2)  45 mg/m2  (Treatment Plan Recorded) Intravenous Once Lloyd Huger, MD        OBJECTIVE: Vitals:   08/19/19 1010  BP: (!) 148/67  Pulse: 77  Temp: 97.9 F (36.6 C)  SpO2: 95%     Body mass index is 35.84 kg/m.    ECOG FS:0 - Asymptomatic  General: Well-developed, well-nourished, no acute distress. Eyes: Pink conjunctiva, anicteric sclera. HEENT: Normocephalic, moist mucous membranes. Lungs: Clear to auscultation bilaterally. Heart: Regular rate and rhythm. No rubs, murmurs, or gallops. Abdomen: Soft, nontender, nondistended. No organomegaly noted, normoactive bowel sounds. Musculoskeletal: No edema, cyanosis, or clubbing. Neuro: Alert, answering all questions appropriately. Cranial nerves grossly intact. Skin: No rashes or petechiae noted. Psych: Normal affect.  LAB RESULTS:  Lab Results  Component Value Date   NA 135 08/19/2019   K 3.9 08/19/2019   CL 99 08/19/2019   CO2 27 08/19/2019   GLUCOSE 202 (H) 08/19/2019   BUN 13 08/19/2019   CREATININE 0.74 08/19/2019   CALCIUM 9.0 08/19/2019   PROT 6.7 08/19/2019   ALBUMIN 3.9 08/19/2019   AST 15 08/19/2019   ALT 16 08/19/2019   ALKPHOS 75 08/19/2019   BILITOT 0.3 08/19/2019   GFRNONAA >60 08/19/2019   GFRAA >60 08/19/2019    Lab Results  Component Value Date   WBC 4.7 08/19/2019   NEUTROABS 3.6 08/19/2019   HGB 12.6 08/19/2019   HCT 39.6 08/19/2019   MCV 91.5 08/19/2019   PLT 299 08/19/2019     STUDIES: Mr Jeri Cos RA Contrast  Result Date: 07/22/2019 CLINICAL DATA:  New diagnosis lung cancer.  Headache.  Staging. EXAM: MRI HEAD WITHOUT AND WITH CONTRAST TECHNIQUE: Multiplanar, multiecho pulse sequences of the brain and surrounding structures were obtained without and with intravenous contrast. CONTRAST:  89mL GADAVIST GADOBUTROL 1 MMOL/ML IV SOLN COMPARISON:  None. FINDINGS: Brain:  Ventricle size and cerebral volume normal. Diffuse white matter hypodensity similar to the prior CT. Moderate hyperintensity in the  pons. No acute infarct. Negative for hemorrhage or mass. No enhancing lesions identified. Negative for metastatic disease. Vascular: Normal arterial flow voids. Skull and upper cervical spine: Negative Sinuses/Orbits: Negative Other: None IMPRESSION: Negative for metastatic disease Moderate to extensive changes in the white matter and cerebellum. This is likely due to chronic microvascular ischemia. Correlate with risk factors. Electronically Signed   By: Franchot Gallo M.D.   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  PLAN:    1. Squamous cell carcinoma of the upper lobe of left lung: Imaging results reviewed independently and it was agreed upon the patient's final staging is considered IIb.  MRI of the brain on July 22, 2019 was reported as negative. She will benefit from current chemotherapy using weekly carboplatinum and Taxol along with daily XRT.  Because she is only stage II, she does not qualify for maintenance durvalumab.  Proceed with cycle 3 of weekly carboplatinum and Taxol today.  Continue daily XRT.  Return to clinic in 1 week for further evaluation and consideration of cycle 4.   2.  Shortness of breath: Chronic and unchanged.  Continue oxygen as prescribed.  I spent a total of 30 minutes face-to-face with the patient of which greater than 50% of the visit was spent in counseling and coordination of care as detailed above.   Patient expressed understanding and was in agreement with this plan. She also understands that She can call clinic at any time with any questions, concerns, or complaints.   Cancer Staging Squamous cell carcinoma lung, left ([place]) Staging form: Lung, AJCC 8th Edition - Clinical stage from 07/26/2019: Stage IIB (cT3, cN0, cM0) - Signed by Lloyd Huger, MD on 07/26/2019   Lloyd Huger, MD   08/19/2019 11:09 AM

## 2019-08-17 ENCOUNTER — Other Ambulatory Visit: Payer: Self-pay

## 2019-08-17 ENCOUNTER — Ambulatory Visit
Admission: RE | Admit: 2019-08-17 | Discharge: 2019-08-17 | Disposition: A | Discharge status: Home / Self Care | Payer: Medicare HMO | Source: Ambulatory Visit | Attending: Radiation Oncology | Admitting: Radiation Oncology

## 2019-08-17 DIAGNOSIS — C3412 Malignant neoplasm of upper lobe, left bronchus or lung: Secondary | ICD-10-CM | POA: Diagnosis not present

## 2019-08-17 DIAGNOSIS — Z510 Encounter for antineoplastic radiation therapy: Secondary | ICD-10-CM | POA: Diagnosis not present

## 2019-08-18 ENCOUNTER — Other Ambulatory Visit: Payer: Self-pay | Admitting: *Deleted

## 2019-08-18 ENCOUNTER — Ambulatory Visit
Admission: RE | Admit: 2019-08-18 | Discharge: 2019-08-18 | Disposition: A | Discharge status: Home / Self Care | Payer: Medicare HMO | Source: Ambulatory Visit | Attending: Radiation Oncology | Admitting: Radiation Oncology

## 2019-08-18 ENCOUNTER — Other Ambulatory Visit: Payer: Self-pay

## 2019-08-18 DIAGNOSIS — Z510 Encounter for antineoplastic radiation therapy: Secondary | ICD-10-CM | POA: Diagnosis not present

## 2019-08-18 DIAGNOSIS — C3412 Malignant neoplasm of upper lobe, left bronchus or lung: Secondary | ICD-10-CM | POA: Diagnosis not present

## 2019-08-18 MED ORDER — SUCRALFATE 1 G PO TABS: 1.0000 g | ORAL_TABLET | Freq: Three times a day (TID) | ORAL | 3 refills | Status: DC

## 2019-08-18 NOTE — Progress Notes (Signed)
Patient pre screened for office appointment, no questions or concerns today. 

## 2019-08-19 ENCOUNTER — Ambulatory Visit
Admission: RE | Admit: 2019-08-19 | Discharge: 2019-08-19 | Disposition: A | Discharge status: Home / Self Care | Payer: Medicare HMO | Source: Ambulatory Visit | Attending: Radiation Oncology | Admitting: Radiation Oncology

## 2019-08-19 ENCOUNTER — Other Ambulatory Visit: Payer: Self-pay

## 2019-08-19 ENCOUNTER — Inpatient Hospital Stay: Payer: Medicare HMO | Attending: Oncology

## 2019-08-19 ENCOUNTER — Inpatient Hospital Stay: Payer: Medicare HMO

## 2019-08-19 ENCOUNTER — Inpatient Hospital Stay (HOSPITAL_BASED_OUTPATIENT_CLINIC_OR_DEPARTMENT_OTHER): Payer: Medicare HMO | Admitting: Oncology

## 2019-08-19 VITALS — BP 148/67 | HR 77 | Temp 97.9°F | Wt 202.3 lb

## 2019-08-19 DIAGNOSIS — C3492 Malignant neoplasm of unspecified part of left bronchus or lung: Secondary | ICD-10-CM

## 2019-08-19 DIAGNOSIS — C3412 Malignant neoplasm of upper lobe, left bronchus or lung: Secondary | ICD-10-CM | POA: Insufficient documentation

## 2019-08-19 DIAGNOSIS — Z5111 Encounter for antineoplastic chemotherapy: Secondary | ICD-10-CM | POA: Insufficient documentation

## 2019-08-19 DIAGNOSIS — Z510 Encounter for antineoplastic radiation therapy: Secondary | ICD-10-CM | POA: Diagnosis not present

## 2019-08-19 LAB — CBC WITH DIFFERENTIAL/PLATELET
Abs Immature Granulocytes: 0.04 10*3/uL (ref 0.00–0.07)
Basophils Absolute: 0 10*3/uL (ref 0.0–0.1)
Basophils Relative: 0 %
Eosinophils Absolute: 0.3 10*3/uL (ref 0.0–0.5)
Eosinophils Relative: 6 %
HCT: 39.6 % (ref 36.0–46.0)
Hemoglobin: 12.6 g/dL (ref 12.0–15.0)
Immature Granulocytes: 1 %
Lymphocytes Relative: 11 %
Lymphs Abs: 0.5 10*3/uL — ABNORMAL LOW (ref 0.7–4.0)
MCH: 29.1 pg (ref 26.0–34.0)
MCHC: 31.8 g/dL (ref 30.0–36.0)
MCV: 91.5 fL (ref 80.0–100.0)
Monocytes Absolute: 0.3 10*3/uL (ref 0.1–1.0)
Monocytes Relative: 6 %
Neutro Abs: 3.6 10*3/uL (ref 1.7–7.7)
Neutrophils Relative %: 76 %
Platelets: 299 10*3/uL (ref 150–400)
RBC: 4.33 MIL/uL (ref 3.87–5.11)
RDW: 14.8 % (ref 11.5–15.5)
WBC: 4.7 10*3/uL (ref 4.0–10.5)
nRBC: 0 % (ref 0.0–0.2)

## 2019-08-19 LAB — COMPREHENSIVE METABOLIC PANEL
ALT: 16 U/L (ref 0–44)
AST: 15 U/L (ref 15–41)
Albumin: 3.9 g/dL (ref 3.5–5.0)
Alkaline Phosphatase: 75 U/L (ref 38–126)
Anion gap: 9 (ref 5–15)
BUN: 13 mg/dL (ref 8–23)
CO2: 27 mmol/L (ref 22–32)
Calcium: 9 mg/dL (ref 8.9–10.3)
Chloride: 99 mmol/L (ref 98–111)
Creatinine, Ser: 0.74 mg/dL (ref 0.44–1.00)
GFR calc Af Amer: >60 mL/min (ref >60)
GFR calc non Af Amer: >60 mL/min (ref >60)
Glucose, Bld: 202 mg/dL — ABNORMAL HIGH (ref 70–99)
Potassium: 3.9 mmol/L (ref 3.5–5.1)
Sodium: 135 mmol/L (ref 135–145)
Total Bilirubin: 0.3 mg/dL (ref 0.3–1.2)
Total Protein: 6.7 g/dL (ref 6.5–8.1)

## 2019-08-19 MED ORDER — DEXAMETHASONE SODIUM PHOSPHATE 10 MG/ML IJ SOLN: 10.0000 mg | Freq: Once | INTRAMUSCULAR | Status: DC

## 2019-08-19 MED ORDER — SODIUM CHLORIDE 0.9 % IV SOLN
45.0000 mg/m2 | Freq: Once | INTRAVENOUS | Status: AC
  Administered 2019-08-19: 90 mg via INTRAVENOUS
  Filled 2019-08-19: qty 15

## 2019-08-19 MED ORDER — PALONOSETRON HCL INJECTION 0.25 MG/5ML
0.2500 mg | Freq: Once | INTRAVENOUS | Status: AC
  Administered 2019-08-19: 0.25 mg via INTRAVENOUS
  Filled 2019-08-19: qty 5

## 2019-08-19 MED ORDER — FAMOTIDINE IN NACL 20-0.9 MG/50ML-% IV SOLN
20.0000 mg | Freq: Once | INTRAVENOUS | Status: AC
  Administered 2019-08-19: 20 mg via INTRAVENOUS
  Filled 2019-08-19: qty 50

## 2019-08-19 MED ORDER — DIPHENHYDRAMINE HCL 50 MG/ML IJ SOLN
25.0000 mg | Freq: Once | INTRAMUSCULAR | Status: AC
  Administered 2019-08-19: 25 mg via INTRAVENOUS
  Filled 2019-08-19: qty 1

## 2019-08-19 MED ORDER — SODIUM CHLORIDE 0.9 % IV SOLN
208.2000 mg | Freq: Once | INTRAVENOUS | Status: AC
  Administered 2019-08-19: 210 mg via INTRAVENOUS
  Filled 2019-08-19: qty 21

## 2019-08-19 MED ORDER — DEXAMETHASONE SODIUM PHOSPHATE 10 MG/ML IJ SOLN
10.0000 mg | Freq: Once | INTRAMUSCULAR | Status: AC
  Administered 2019-08-19: 10 mg via INTRAVENOUS
  Filled 2019-08-19: qty 1

## 2019-08-19 MED ORDER — SODIUM CHLORIDE 0.9 % IV SOLN
Freq: Once | INTRAVENOUS | Status: AC
  Administered 2019-08-19: 11:00:00 via INTRAVENOUS
  Filled 2019-08-19: qty 250

## 2019-08-20 ENCOUNTER — Other Ambulatory Visit: Payer: Self-pay | Admitting: Oncology

## 2019-08-20 ENCOUNTER — Ambulatory Visit
Admission: RE | Admit: 2019-08-20 | Discharge: 2019-08-20 | Disposition: A | Discharge status: Home / Self Care | Payer: Medicare HMO | Source: Ambulatory Visit | Attending: Radiation Oncology | Admitting: Radiation Oncology

## 2019-08-20 ENCOUNTER — Other Ambulatory Visit: Payer: Self-pay

## 2019-08-20 DIAGNOSIS — C3412 Malignant neoplasm of upper lobe, left bronchus or lung: Secondary | ICD-10-CM | POA: Diagnosis not present

## 2019-08-20 DIAGNOSIS — Z510 Encounter for antineoplastic radiation therapy: Secondary | ICD-10-CM | POA: Diagnosis not present

## 2019-08-21 ENCOUNTER — Other Ambulatory Visit: Payer: Self-pay

## 2019-08-21 ENCOUNTER — Ambulatory Visit
Admission: RE | Admit: 2019-08-21 | Discharge: 2019-08-21 | Disposition: A | Discharge status: Home / Self Care | Payer: Medicare HMO | Source: Ambulatory Visit | Attending: Radiation Oncology | Admitting: Radiation Oncology

## 2019-08-21 DIAGNOSIS — C3412 Malignant neoplasm of upper lobe, left bronchus or lung: Secondary | ICD-10-CM | POA: Diagnosis not present

## 2019-08-21 DIAGNOSIS — Z510 Encounter for antineoplastic radiation therapy: Secondary | ICD-10-CM | POA: Diagnosis not present

## 2019-08-21 NOTE — Progress Notes (Signed)
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  INTERVAL HISTORY: Patient returns to clinic today for further evaluation and consideration of cycle 4 of weekly carboplatinum and Taxol.  She currently feels well and is asymptomatic.  She is tolerating her treatment without significant side effects. She has chronic shortness of breath and requires oxygen 24 hours a day.  She has no neurologic complaints.  She denies any recent fevers or illnesses.  She has a good appetite and denies weight loss.  She denies any pain.  She has no chest pain, cough, or hemoptysis.  She denies any nausea, vomiting, constipation, or diarrhea.  She has no urinary complaints.  Patient offers no further specific complaints today.  REVIEW OF SYSTEMS:   Review of Systems  Constitutional: Negative.  Negative for fever, malaise/fatigue and weight loss.  Respiratory: Positive for shortness of breath. Negative for cough and hemoptysis.   Cardiovascular: Negative.  Negative for chest pain and leg swelling.  Gastrointestinal: Negative.  Negative for abdominal pain.  Genitourinary: Negative.  Negative for dysuria.  Musculoskeletal: Negative.  Negative for back pain.  Skin: Negative.  Negative for rash.  Neurological: Negative.  Negative for dizziness, weakness and headaches.  Psychiatric/Behavioral: Negative.  The patient is not nervous/anxious.     As per HPI. Otherwise, a complete review of systems is negative.  PAST MEDICAL HISTORY: Past Medical History:  Diagnosis Date  . Anxiety   . Asthma   . Cancer ([place]) 12/2018   w/u for right upper lobe mass/cancer  . COPD (chronic obstructive pulmonary disease) (HCC)    also, emphysema. now using o2 via np 24 hours  a day  . Coronary artery disease   . Depression   . Diabetes mellitus, type II ([place])   . GERD (gastroesophageal reflux disease)   . Headache    migraines in early 20's  . Heart murmur   . Hypertension   . Lung cancer ([place])   . Neuropathy   . Restless leg     PAST SURGICAL HISTORY: Past Surgical History:  Procedure Laterality Date  . BACK SURGERY  2005   surgery x 2, disc fused in neck, pinched nerve in center of back and neck  . CARDIAC CATHETERIZATION  2015   1 stent placed for blockage  . cervical bone infusion    . CHOLECYSTECTOMY    . THORACIC DISC SURGERY    . TUBAL LIGATION    . VIDEO BRONCHOSCOPY WITH ENDOBRONCHIAL ULTRASOUND N/A 03/06/2019   Procedure: VIDEO BRONCHOSCOPY WITH ENDOBRONCHIAL ULTRASOUND - DIABETIC;  Surgeon: Ottie Glazier, MD;  Location: [facility] ORS;  Service: Thoracic;  Laterality: N/A;    FAMILY HISTORY: Family History  Problem Relation Age of Onset  . Anxiety disorder Mother   . Cancer - Lung Mother   . Depression Sister   . Cancer Sister   . Depression Brother   . Cancer Sister   . Cancer Sister   . Cancer Sister   . Heart Problems Sister   . Bipolar disorder Sister   . Diabetes Sister   . Cancer - Lung Sister   . Hypertension Sister   . Diabetes Sister   . Hyperlipidemia Sister   . COPD Sister   . Heart Problems Brother   . Cancer  Brother   . Cancer Brother   . Cancer Brother   . Breast cancer Maternal Aunt     ADVANCED DIRECTIVES (Y/N):  N  HEALTH MAINTENANCE: Social History   Tobacco Use  . Smoking status: Current Every Day Smoker    Packs/day: 1.00    Years: 40.00    Pack years: 40.00    Types: Cigarettes    Start date: 05/05/1975  . Smokeless tobacco: Never Used  Substance Use Topics  . Alcohol use: No    Alcohol/week: 0.0 standard drinks    Comment: socially  . Drug use: No     Colonoscopy:  PAP:  Bone density:  Lipid panel:  Allergies  Allergen Reactions  . Diclofenac-Misoprostol Other (See Comments)     Arthrotec - gastritis   . Nsaids Other (See Comments)    gastritis  . Zolpidem Other (See Comments)    Sleep walking   . Hydrocodone-Acetaminophen Nausea And Vomiting  . Simvastatin Other (See Comments)    body aches    Current Outpatient Medications  Medication Sig Dispense Refill  . ACCU-CHEK FASTCLIX LANCETS MISC     . ACCU-CHEK SMARTVIEW test strip     . albuterol (PROVENTIL HFA) 108 (90 BASE) MCG/ACT inhaler Inhale 2 puffs into the lungs every 4 (four) hours as needed for wheezing or shortness of breath.     Marland Kitchen albuterol (PROVENTIL) (2.5 MG/3ML) 0.083% nebulizer solution Take 2.5 mg by nebulization every 6 (six) hours as needed for wheezing or shortness of breath.    Marland Kitchen amLODipine (NORVASC) 10 MG tablet Take 10 mg by mouth daily.     Marland Kitchen aspirin 81 MG chewable tablet Chew 1 tablet (81 mg total) by mouth daily. Continue holding now, as you are doing until procedure. (Patient taking differently: Chew 81 mg by mouth daily. ) 30 tablet 0  . carbidopa-levodopa (SINEMET IR) 25-100 MG tablet Take 1 tablet by mouth 3 (three) times daily.    . diclofenac sodium (VOLTAREN) 1 % GEL     . esomeprazole (NEXIUM) 40 MG capsule Take 40 mg by mouth daily.    . ferrous sulfate 325 (65 FE) MG tablet Take 1 tablet (325 mg total) by mouth 2 (two) times daily with a meal. 30 tablet 3  . fluticasone (FLONASE) 50 MCG/ACT nasal spray Place 2 sprays into both nostrils daily as needed for allergies.     . Fluticasone-Umeclidin-Vilant 100-62.5-25 MCG/INH AEPB Inhale 1 puff into the lungs daily. Treligy    . gabapentin (NEURONTIN) 100 MG capsule Take 1 capsule (100 mg total) by mouth 2 (two) times daily. QAM, QNOON (Patient taking differently: Take 200 mg by mouth daily. QAM) 180 capsule 2  . gabapentin (NEURONTIN) 400 MG capsule Take 1 capsule (400 mg total) by mouth 2 (two) times daily. 400 mg around 1300, and 2000 180 capsule 2  . ipratropium (ATROVENT) 0.02 % nebulizer solution Take 2.5 mLs (0.5 mg total) by  nebulization every 6 (six) hours as needed for wheezing or shortness of breath. 75 mL 12  . metFORMIN (GLUCOPHAGE) 500 MG tablet Take 500 mg by mouth daily with breakfast.     . metoprolol tartrate (LOPRESSOR) 25 MG tablet Take 25 mg by mouth 2 (two) times daily.     . Multiple Vitamin (MULTIVITAMIN) tablet Take 1 tablet by mouth daily.    . ondansetron (ZOFRAN) 8 MG tablet Take 1 tablet (8 mg total) by mouth 2 (two) times daily. 60 tablet 0  . OXYGEN Inhale 2 L  into the lungs continuous.    . potassium chloride (KLOR-CON) 8 MEQ tablet Take 8 mEq by mouth daily.     . pravastatin (PRAVACHOL) 80 MG tablet Take 80 mg by mouth every evening.     . prochlorperazine (COMPAZINE) 10 MG tablet Take 1 tablet (10 mg total) by mouth every 6 (six) hours as needed (Nausea or vomiting). 60 tablet 2  . solifenacin (VESICARE) 10 MG tablet Take 10 mg by mouth daily.    Marland Kitchen spironolactone (ALDACTONE) 50 MG tablet Take 50 mg by mouth daily.     . sucralfate (CARAFATE) 1 g tablet Take 1 tablet (1 g total) by mouth 3 (three) times daily. Dissolve in 3-4 tbsp warm water, swish and swallow 90 tablet 3  . venlafaxine XR (EFFEXOR-XR) 150 MG 24 hr capsule Take 1 capsule (150 mg total) by mouth daily with breakfast. 90 capsule 1   No current facility-administered medications for this visit.     OBJECTIVE: Vitals:   08/26/19 0943  BP: (!) 152/73  Pulse: 83  Resp: 18  Temp: 97.6 F (36.4 C)  SpO2: 97%     Body mass index is 35.62 kg/m.    ECOG FS:0 - Asymptomatic  General: Well-developed, well-nourished, no acute distress. Eyes: Pink conjunctiva, anicteric sclera. HEENT: Normocephalic, moist mucous membranes. Lungs: Clear to auscultation bilaterally. Heart: Regular rate and rhythm. No rubs, murmurs, or gallops. Abdomen: Soft, nontender, nondistended. No organomegaly noted, normoactive bowel sounds. Musculoskeletal: No edema, cyanosis, or clubbing. Neuro: Alert, answering all questions appropriately. Cranial  nerves grossly intact. Skin: No rashes or petechiae noted. Psych: Normal affect.  LAB RESULTS:  Lab Results  Component Value Date   NA 134 (L) 08/26/2019   K 4.3 08/26/2019   CL 99 08/26/2019   CO2 31 08/26/2019   GLUCOSE 171 (H) 08/26/2019   BUN 16 08/26/2019   CREATININE 0.68 08/26/2019   CALCIUM 8.9 08/26/2019   PROT 6.7 08/26/2019   ALBUMIN 4.0 08/26/2019   AST 15 08/26/2019   ALT 17 08/26/2019   ALKPHOS 69 08/26/2019   BILITOT 0.4 08/26/2019   GFRNONAA >60 08/26/2019   GFRAA >60 08/26/2019    Lab Results  Component Value Date   WBC 4.1 08/26/2019   NEUTROABS 3.3 08/26/2019   HGB 12.2 08/26/2019   HCT 38.1 08/26/2019   MCV 92.9 08/26/2019   PLT 281 08/26/2019     STUDIES: No results found.  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  PLAN:    1. Squamous cell carcinoma of the upper lobe of left lung: Imaging results reviewed independently and it was agreed upon the patient's final staging is considered IIb.  MRI of the brain on July 22, 2019 was reported as negative. She will benefit from current chemotherapy using weekly carboplatinum and Taxol along with daily XRT.  Because she is only stage II, she does not qualify for maintenance durvalumab.  Proceed with cycle 4 of weekly carboplatin and Taxol today.  Continue daily XRT.  Return to clinic in 1 week for further evaluation and consideration of cycle 5.   2.  Shortness of breath: Chronic and unchanged.  Continue oxygen as prescribed.  I spent a total of 30 minutes face-to-face with the patient of which greater than 50% of the visit was spent in counseling and coordination of care as detailed above.  Patient expressed understanding and was in agreement with this plan. She also understands that She can call clinic at any  time with any questions, concerns, or complaints.   Cancer Staging Squamous cell carcinoma lung, left ([place]) Staging form: Lung, AJCC 8th Edition -  Clinical stage from 07/26/2019: Stage IIB (cT3, cN0, cM0) - Signed by Lloyd Huger, MD on 07/26/2019   Lloyd Huger, MD   08/26/2019 3:53 PM

## 2019-08-24 ENCOUNTER — Other Ambulatory Visit: Payer: Self-pay

## 2019-08-24 ENCOUNTER — Ambulatory Visit
Admission: RE | Admit: 2019-08-24 | Discharge: 2019-08-24 | Disposition: A | Discharge status: Home / Self Care | Payer: Medicare HMO | Source: Ambulatory Visit | Attending: Radiation Oncology | Admitting: Radiation Oncology

## 2019-08-24 DIAGNOSIS — Z510 Encounter for antineoplastic radiation therapy: Secondary | ICD-10-CM | POA: Diagnosis not present

## 2019-08-24 DIAGNOSIS — C3412 Malignant neoplasm of upper lobe, left bronchus or lung: Secondary | ICD-10-CM | POA: Diagnosis not present

## 2019-08-25 ENCOUNTER — Other Ambulatory Visit: Payer: Self-pay

## 2019-08-25 ENCOUNTER — Ambulatory Visit
Admission: RE | Admit: 2019-08-25 | Discharge: 2019-08-25 | Disposition: A | Discharge status: Home / Self Care | Payer: Medicare HMO | Source: Ambulatory Visit | Attending: Radiation Oncology | Admitting: Radiation Oncology

## 2019-08-25 DIAGNOSIS — Z510 Encounter for antineoplastic radiation therapy: Secondary | ICD-10-CM | POA: Diagnosis not present

## 2019-08-25 DIAGNOSIS — C3412 Malignant neoplasm of upper lobe, left bronchus or lung: Secondary | ICD-10-CM | POA: Diagnosis not present

## 2019-08-25 NOTE — Progress Notes (Signed)
Patient pre screened for office appointment, no questions or concerns today. Patient reminded of upcoming appointment time and date. 

## 2019-08-26 ENCOUNTER — Other Ambulatory Visit: Payer: Self-pay

## 2019-08-26 ENCOUNTER — Inpatient Hospital Stay: Payer: Medicare HMO

## 2019-08-26 ENCOUNTER — Inpatient Hospital Stay (HOSPITAL_BASED_OUTPATIENT_CLINIC_OR_DEPARTMENT_OTHER): Payer: Medicare HMO | Admitting: Oncology

## 2019-08-26 ENCOUNTER — Ambulatory Visit
Admission: RE | Admit: 2019-08-26 | Discharge: 2019-08-26 | Disposition: A | Discharge status: Home / Self Care | Payer: Medicare HMO | Source: Ambulatory Visit | Attending: Radiation Oncology | Admitting: Radiation Oncology

## 2019-08-26 VITALS — BP 152/73 | HR 83 | Temp 97.6°F | Resp 18 | Wt 201.1 lb

## 2019-08-26 VITALS — BP 137/82 | HR 86 | Resp 20

## 2019-08-26 DIAGNOSIS — C3492 Malignant neoplasm of unspecified part of left bronchus or lung: Secondary | ICD-10-CM | POA: Diagnosis not present

## 2019-08-26 DIAGNOSIS — C3412 Malignant neoplasm of upper lobe, left bronchus or lung: Secondary | ICD-10-CM | POA: Diagnosis not present

## 2019-08-26 DIAGNOSIS — Z5111 Encounter for antineoplastic chemotherapy: Secondary | ICD-10-CM | POA: Diagnosis not present

## 2019-08-26 DIAGNOSIS — Z510 Encounter for antineoplastic radiation therapy: Secondary | ICD-10-CM | POA: Diagnosis not present

## 2019-08-26 LAB — CBC WITH DIFFERENTIAL/PLATELET
Abs Immature Granulocytes: 0.03 10*3/uL (ref 0.00–0.07)
Basophils Absolute: 0 10*3/uL (ref 0.0–0.1)
Basophils Relative: 1 %
Eosinophils Absolute: 0.1 10*3/uL (ref 0.0–0.5)
Eosinophils Relative: 2 %
HCT: 38.1 % (ref 36.0–46.0)
Hemoglobin: 12.2 g/dL (ref 12.0–15.0)
Immature Granulocytes: 1 %
Lymphocytes Relative: 10 %
Lymphs Abs: 0.4 10*3/uL — ABNORMAL LOW (ref 0.7–4.0)
MCH: 29.8 pg (ref 26.0–34.0)
MCHC: 32 g/dL (ref 30.0–36.0)
MCV: 92.9 fL (ref 80.0–100.0)
Monocytes Absolute: 0.3 10*3/uL (ref 0.1–1.0)
Monocytes Relative: 7 %
Neutro Abs: 3.3 10*3/uL (ref 1.7–7.7)
Neutrophils Relative %: 79 %
Platelets: 281 10*3/uL (ref 150–400)
RBC: 4.1 MIL/uL (ref 3.87–5.11)
RDW: 15 % (ref 11.5–15.5)
WBC: 4.1 10*3/uL (ref 4.0–10.5)
nRBC: 0 % (ref 0.0–0.2)

## 2019-08-26 LAB — COMPREHENSIVE METABOLIC PANEL
ALT: 17 U/L (ref 0–44)
AST: 15 U/L (ref 15–41)
Albumin: 4 g/dL (ref 3.5–5.0)
Alkaline Phosphatase: 69 U/L (ref 38–126)
Anion gap: 4 — ABNORMAL LOW (ref 5–15)
BUN: 16 mg/dL (ref 8–23)
CO2: 31 mmol/L (ref 22–32)
Calcium: 8.9 mg/dL (ref 8.9–10.3)
Chloride: 99 mmol/L (ref 98–111)
Creatinine, Ser: 0.68 mg/dL (ref 0.44–1.00)
GFR calc Af Amer: >60 mL/min (ref >60)
GFR calc non Af Amer: >60 mL/min (ref >60)
Glucose, Bld: 171 mg/dL — ABNORMAL HIGH (ref 70–99)
Potassium: 4.3 mmol/L (ref 3.5–5.1)
Sodium: 134 mmol/L — ABNORMAL LOW (ref 135–145)
Total Bilirubin: 0.4 mg/dL (ref 0.3–1.2)
Total Protein: 6.7 g/dL (ref 6.5–8.1)

## 2019-08-26 MED ORDER — FAMOTIDINE IN NACL 20-0.9 MG/50ML-% IV SOLN
20.0000 mg | Freq: Once | INTRAVENOUS | Status: AC
  Administered 2019-08-26: 20 mg via INTRAVENOUS
  Filled 2019-08-26: qty 50

## 2019-08-26 MED ORDER — DEXAMETHASONE SODIUM PHOSPHATE 10 MG/ML IJ SOLN
10.0000 mg | Freq: Once | INTRAMUSCULAR | Status: DC
  Filled 2019-08-26: qty 1

## 2019-08-26 MED ORDER — DIPHENHYDRAMINE HCL 50 MG/ML IJ SOLN
12.5000 mg | Freq: Once | INTRAMUSCULAR | Status: AC
  Administered 2019-08-26: 12.5 mg via INTRAVENOUS
  Filled 2019-08-26: qty 1

## 2019-08-26 MED ORDER — SODIUM CHLORIDE 0.9 % IV SOLN
208.2000 mg | Freq: Once | INTRAVENOUS | Status: AC
  Administered 2019-08-26: 210 mg via INTRAVENOUS
  Filled 2019-08-26: qty 21

## 2019-08-26 MED ORDER — DEXAMETHASONE SODIUM PHOSPHATE 10 MG/ML IJ SOLN
10.0000 mg | Freq: Once | INTRAMUSCULAR | Status: AC
  Administered 2019-08-26: 10 mg via INTRAVENOUS

## 2019-08-26 MED ORDER — SODIUM CHLORIDE 0.9 % IV SOLN
Freq: Once | INTRAVENOUS | Status: AC
  Administered 2019-08-26: 11:00:00 via INTRAVENOUS
  Filled 2019-08-26: qty 250

## 2019-08-26 MED ORDER — SODIUM CHLORIDE 0.9 % IV SOLN
45.0000 mg/m2 | Freq: Once | INTRAVENOUS | Status: AC
  Administered 2019-08-26: 90 mg via INTRAVENOUS
  Filled 2019-08-26: qty 15

## 2019-08-26 MED ORDER — PALONOSETRON HCL INJECTION 0.25 MG/5ML
0.2500 mg | Freq: Once | INTRAVENOUS | Status: AC
  Administered 2019-08-26: 0.25 mg via INTRAVENOUS
  Filled 2019-08-26: qty 5

## 2019-08-27 ENCOUNTER — Ambulatory Visit
Admission: RE | Admit: 2019-08-27 | Discharge: 2019-08-27 | Disposition: A | Discharge status: Home / Self Care | Payer: Medicare HMO | Source: Ambulatory Visit | Attending: Radiation Oncology | Admitting: Radiation Oncology

## 2019-08-27 ENCOUNTER — Other Ambulatory Visit: Payer: Self-pay

## 2019-08-27 DIAGNOSIS — C3412 Malignant neoplasm of upper lobe, left bronchus or lung: Secondary | ICD-10-CM | POA: Diagnosis not present

## 2019-08-27 DIAGNOSIS — Z510 Encounter for antineoplastic radiation therapy: Secondary | ICD-10-CM | POA: Diagnosis not present

## 2019-08-28 ENCOUNTER — Ambulatory Visit
Admission: RE | Admit: 2019-08-28 | Discharge: 2019-08-28 | Disposition: A | Discharge status: Home / Self Care | Payer: Medicare HMO | Source: Ambulatory Visit | Attending: Radiation Oncology | Admitting: Radiation Oncology

## 2019-08-28 ENCOUNTER — Other Ambulatory Visit: Payer: Self-pay

## 2019-08-28 DIAGNOSIS — Z510 Encounter for antineoplastic radiation therapy: Secondary | ICD-10-CM | POA: Diagnosis not present

## 2019-08-28 DIAGNOSIS — C3412 Malignant neoplasm of upper lobe, left bronchus or lung: Secondary | ICD-10-CM | POA: Diagnosis not present

## 2019-08-29 NOTE — Progress Notes (Signed)
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  INTERVAL HISTORY: Patient returns to clinic today for further evaluation and consideration of cycle 5 of weekly carboplatinum and Taxol.  She is tolerating her treatments well without significant side effects.  She currently feels well and at her baseline. She has chronic shortness of breath and requires oxygen 24 hours a day.  She has no neurologic complaints.  She denies any recent fevers or illnesses.  She has a good appetite and denies weight loss.  She denies any pain.  She has no chest pain, cough, or hemoptysis.  She denies any nausea, vomiting, constipation, or diarrhea.  She has no urinary complaints.  Patient offers no further specific complaints today.  REVIEW OF SYSTEMS:   Review of Systems  Constitutional: Negative.  Negative for fever, malaise/fatigue and weight loss.  Respiratory: Positive for shortness of breath. Negative for cough and hemoptysis.   Cardiovascular: Negative.  Negative for chest pain and leg swelling.  Gastrointestinal: Negative.  Negative for abdominal pain.  Genitourinary: Negative.  Negative for dysuria.  Musculoskeletal: Negative.  Negative for back pain.  Skin: Negative.  Negative for rash.  Neurological: Negative.  Negative for dizziness, weakness and headaches.  Psychiatric/Behavioral: Negative.  The patient is not nervous/anxious.     As per HPI. Otherwise, a complete review of systems is negative.  PAST MEDICAL HISTORY: Past Medical History:  Diagnosis Date  . Anxiety   . Asthma   . Cancer ([place]) 12/2018   w/u for right upper lobe mass/cancer  . COPD (chronic obstructive pulmonary disease) (HCC)    also, emphysema. now using o2 via np 24  hours a day  . Coronary artery disease   . Depression   . Diabetes mellitus, type II ([place])   . GERD (gastroesophageal reflux disease)   . Headache    migraines in early 20's  . Heart murmur   . Hypertension   . Lung cancer ([place])   . Neuropathy   . Restless leg     PAST SURGICAL HISTORY: Past Surgical History:  Procedure Laterality Date  . BACK SURGERY  2005   surgery x 2, disc fused in neck, pinched nerve in center of back and neck  . CARDIAC CATHETERIZATION  2015   1 stent placed for blockage  . cervical bone infusion    . CHOLECYSTECTOMY    . THORACIC DISC SURGERY    . TUBAL LIGATION    . VIDEO BRONCHOSCOPY WITH ENDOBRONCHIAL ULTRASOUND N/A 03/06/2019   Procedure: VIDEO BRONCHOSCOPY WITH ENDOBRONCHIAL ULTRASOUND - DIABETIC;  Surgeon: Ottie Glazier, MD;  Location: [facility] ORS;  Service: Thoracic;  Laterality: N/A;    FAMILY HISTORY: Family History  Problem Relation Age of Onset  . Anxiety disorder Mother   . Cancer - Lung Mother   . Depression Sister   . Cancer Sister   . Depression Brother   . Cancer Sister   . Cancer Sister   . Cancer Sister   . Heart Problems Sister   . Bipolar disorder Sister   . Diabetes Sister   . Cancer - Lung Sister   . Hypertension Sister   . Diabetes Sister   . Hyperlipidemia Sister   . COPD Sister   . Heart Problems Brother   .  Cancer Brother   . Cancer Brother   . Cancer Brother   . Breast cancer Maternal Aunt     ADVANCED DIRECTIVES (Y/N):  N  HEALTH MAINTENANCE: Social History   Tobacco Use  . Smoking status: Current Every Day Smoker    Packs/day: 1.00    Years: 40.00    Pack years: 40.00    Types: Cigarettes    Start date: 05/05/1975  . Smokeless tobacco: Never Used  Substance Use Topics  . Alcohol use: No    Alcohol/week: 0.0 standard drinks    Comment: socially  . Drug use: No     Colonoscopy:  PAP:  Bone density:  Lipid panel:  Allergies  Allergen Reactions  . Diclofenac-Misoprostol Other (See Comments)     Arthrotec - gastritis   . Nsaids Other (See Comments)    gastritis  . Zolpidem Other (See Comments)    Sleep walking   . Hydrocodone-Acetaminophen Nausea And Vomiting  . Simvastatin Other (See Comments)    body aches    Current Outpatient Medications  Medication Sig Dispense Refill  . ACCU-CHEK FASTCLIX LANCETS MISC     . ACCU-CHEK SMARTVIEW test strip     . albuterol (PROVENTIL HFA) 108 (90 BASE) MCG/ACT inhaler Inhale 2 puffs into the lungs every 4 (four) hours as needed for wheezing or shortness of breath.     Marland Kitchen albuterol (PROVENTIL) (2.5 MG/3ML) 0.083% nebulizer solution Take 2.5 mg by nebulization every 6 (six) hours as needed for wheezing or shortness of breath.    Marland Kitchen amLODipine (NORVASC) 10 MG tablet Take 10 mg by mouth daily.     Marland Kitchen aspirin 81 MG chewable tablet Chew 1 tablet (81 mg total) by mouth daily. Continue holding now, as you are doing until procedure. (Patient taking differently: Chew 81 mg by mouth daily. ) 30 tablet 0  . carbidopa-levodopa (SINEMET IR) 25-100 MG tablet Take 1 tablet by mouth 3 (three) times daily.    . diclofenac sodium (VOLTAREN) 1 % GEL     . esomeprazole (NEXIUM) 40 MG capsule Take 40 mg by mouth daily.    . ferrous sulfate 325 (65 FE) MG tablet Take 1 tablet (325 mg total) by mouth 2 (two) times daily with a meal. 30 tablet 3  . fluticasone (FLONASE) 50 MCG/ACT nasal spray Place 2 sprays into both nostrils daily as needed for allergies.     . Fluticasone-Umeclidin-Vilant 100-62.5-25 MCG/INH AEPB Inhale 1 puff into the lungs daily. Treligy    . gabapentin (NEURONTIN) 100 MG capsule Take 1 capsule (100 mg total) by mouth 2 (two) times daily. QAM, QNOON (Patient taking differently: Take 200 mg by mouth daily. QAM) 180 capsule 2  . gabapentin (NEURONTIN) 400 MG capsule Take 1 capsule (400 mg total) by mouth 2 (two) times daily. 400 mg around 1300, and 2000 180 capsule 2  . ipratropium (ATROVENT) 0.02 % nebulizer solution Take 2.5 mLs (0.5 mg total) by  nebulization every 6 (six) hours as needed for wheezing or shortness of breath. 75 mL 12  . metFORMIN (GLUCOPHAGE) 500 MG tablet Take 500 mg by mouth daily with breakfast.     . metoprolol tartrate (LOPRESSOR) 25 MG tablet Take 25 mg by mouth 2 (two) times daily.     . Multiple Vitamin (MULTIVITAMIN) tablet Take 1 tablet by mouth daily.    . ondansetron (ZOFRAN) 8 MG tablet Take 1 tablet (8 mg total) by mouth 2 (two) times daily. 60 tablet 0  . OXYGEN Inhale 2  L into the lungs continuous.    . potassium chloride (KLOR-CON) 8 MEQ tablet Take 8 mEq by mouth daily.     . pravastatin (PRAVACHOL) 80 MG tablet Take 80 mg by mouth every evening.     . prochlorperazine (COMPAZINE) 10 MG tablet Take 1 tablet (10 mg total) by mouth every 6 (six) hours as needed (Nausea or vomiting). 60 tablet 2  . solifenacin (VESICARE) 10 MG tablet Take 10 mg by mouth daily.    Marland Kitchen spironolactone (ALDACTONE) 50 MG tablet Take 50 mg by mouth daily.     . sucralfate (CARAFATE) 1 g tablet Take 1 tablet (1 g total) by mouth 3 (three) times daily. Dissolve in 3-4 tbsp warm water, swish and swallow 90 tablet 3  . venlafaxine XR (EFFEXOR-XR) 150 MG 24 hr capsule Take 1 capsule (150 mg total) by mouth daily with breakfast. 90 capsule 1  . mirabegron ER (MYRBETRIQ) 25 MG TB24 tablet Take 1 tablet by mouth daily.     No current facility-administered medications for this visit.    Facility-Administered Medications Ordered in Other Visits  Medication Dose Route Frequency Provider Last Rate Last Dose  . CARBOplatin (PARAPLATIN) 210 mg in sodium chloride 0.9 % 250 mL chemo infusion  210 mg Intravenous Once Lloyd Huger, MD 542 mL/hr at 09/03/19 1148 210 mg at 09/03/19 1148  . heparin lock flush 100 unit/mL  500 Units Intracatheter Once PRN Lloyd Huger, MD        OBJECTIVE: Vitals:   09/03/19 0852  BP: 126/90  Pulse: 81  Temp: 97.8 F (36.6 C)  SpO2: 97%     Body mass index is 35.71 kg/m.    ECOG FS:0 -  Asymptomatic  General: Well-developed, well-nourished, no acute distress. Eyes: Pink conjunctiva, anicteric sclera. HEENT: Normocephalic, moist mucous membranes. Lungs: Clear to auscultation bilaterally. Heart: Regular rate and rhythm. No rubs, murmurs, or gallops. Abdomen: Soft, nontender, nondistended. No organomegaly noted, normoactive bowel sounds. Musculoskeletal: No edema, cyanosis, or clubbing. Neuro: Alert, answering all questions appropriately. Cranial nerves grossly intact. Skin: No rashes or petechiae noted. Psych: Normal affect.  LAB RESULTS:  Lab Results  Component Value Date   NA 135 09/03/2019   K 4.4 09/03/2019   CL 99 09/03/2019   CO2 30 09/03/2019   GLUCOSE 131 (H) 09/03/2019   BUN 13 09/03/2019   CREATININE 0.76 09/03/2019   CALCIUM 9.3 09/03/2019   PROT 7.2 09/03/2019   ALBUMIN 4.2 09/03/2019   AST 16 09/03/2019   ALT 6 09/03/2019   ALKPHOS 74 09/03/2019   BILITOT 0.4 09/03/2019   GFRNONAA >60 09/03/2019   GFRAA >60 09/03/2019    Lab Results  Component Value Date   WBC 4.9 09/03/2019   NEUTROABS 3.9 09/03/2019   HGB 12.6 09/03/2019   HCT 39.1 09/03/2019   MCV 93.1 09/03/2019   PLT 190 09/03/2019     STUDIES: No results found.  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  PLAN:    1. Squamous cell carcinoma of the upper lobe of left lung: Imaging results reviewed independently and it was agreed upon the patient's final staging is considered IIb.  MRI of the brain on July 22, 2019 was reported as negative. She will benefit from current chemotherapy using weekly carboplatinum and Taxol along with daily XRT.  Because she is only stage II, she does not qualify for maintenance durvalumab.  Proceed with cycle 5 of weekly carboplatin and Taxol today.  Continue daily XRT completing on September 25, 2019.  Turn to clinic in 1 week for further evaluation and consideration of cycle 6.  2.  Shortness of breath: Chronic  and unchanged.  Continue oxygen as prescribed.  I spent a total of 30 minutes face-to-face with the patient of which greater than 50% of the visit was spent in counseling and coordination of care as detailed above.   Patient expressed understanding and was in agreement with this plan. She also understands that She can call clinic at any time with any questions, concerns, or complaints.   Cancer Staging Squamous cell carcinoma lung, left ([place]) Staging form: Lung, AJCC 8th Edition - Clinical stage from 07/26/2019: Stage IIB (cT3, cN0, cM0) - Signed by Lloyd Huger, MD on 07/26/2019   Lloyd Huger, MD   09/03/2019 11:53 AM

## 2019-08-31 ENCOUNTER — Other Ambulatory Visit: Payer: Self-pay

## 2019-08-31 ENCOUNTER — Ambulatory Visit
Admission: RE | Admit: 2019-08-31 | Discharge: 2019-08-31 | Disposition: A | Discharge status: Home / Self Care | Payer: Medicare HMO | Source: Ambulatory Visit | Attending: Radiation Oncology | Admitting: Radiation Oncology

## 2019-08-31 DIAGNOSIS — C3412 Malignant neoplasm of upper lobe, left bronchus or lung: Secondary | ICD-10-CM | POA: Diagnosis not present

## 2019-08-31 DIAGNOSIS — Z510 Encounter for antineoplastic radiation therapy: Secondary | ICD-10-CM | POA: Diagnosis not present

## 2019-09-01 ENCOUNTER — Ambulatory Visit
Admission: RE | Admit: 2019-09-01 | Discharge: 2019-09-01 | Disposition: A | Discharge status: Home / Self Care | Payer: Medicare HMO | Source: Ambulatory Visit | Attending: Radiation Oncology | Admitting: Radiation Oncology

## 2019-09-01 ENCOUNTER — Other Ambulatory Visit: Payer: Self-pay

## 2019-09-01 DIAGNOSIS — Z510 Encounter for antineoplastic radiation therapy: Secondary | ICD-10-CM | POA: Diagnosis not present

## 2019-09-01 DIAGNOSIS — C3412 Malignant neoplasm of upper lobe, left bronchus or lung: Secondary | ICD-10-CM | POA: Diagnosis not present

## 2019-09-02 ENCOUNTER — Ambulatory Visit
Admission: RE | Admit: 2019-09-02 | Discharge: 2019-09-02 | Disposition: A | Discharge status: Home / Self Care | Payer: Medicare HMO | Source: Ambulatory Visit | Attending: Radiation Oncology | Admitting: Radiation Oncology

## 2019-09-02 ENCOUNTER — Other Ambulatory Visit: Payer: Self-pay

## 2019-09-02 DIAGNOSIS — Z510 Encounter for antineoplastic radiation therapy: Secondary | ICD-10-CM | POA: Diagnosis not present

## 2019-09-02 DIAGNOSIS — C3412 Malignant neoplasm of upper lobe, left bronchus or lung: Secondary | ICD-10-CM | POA: Diagnosis not present

## 2019-09-02 DIAGNOSIS — C3492 Malignant neoplasm of unspecified part of left bronchus or lung: Secondary | ICD-10-CM | POA: Diagnosis not present

## 2019-09-02 DIAGNOSIS — Z5111 Encounter for antineoplastic chemotherapy: Secondary | ICD-10-CM | POA: Diagnosis not present

## 2019-09-02 NOTE — Progress Notes (Signed)
Patient pre screened for office appointment, no questions or concerns today. Patient reminded of upcoming appointment time and date. 

## 2019-09-03 ENCOUNTER — Other Ambulatory Visit: Payer: Self-pay | Admitting: Oncology

## 2019-09-03 ENCOUNTER — Inpatient Hospital Stay: Payer: Medicare HMO

## 2019-09-03 ENCOUNTER — Other Ambulatory Visit: Payer: Self-pay

## 2019-09-03 ENCOUNTER — Inpatient Hospital Stay (HOSPITAL_BASED_OUTPATIENT_CLINIC_OR_DEPARTMENT_OTHER): Payer: Medicare HMO | Admitting: Oncology

## 2019-09-03 ENCOUNTER — Ambulatory Visit
Admission: RE | Admit: 2019-09-03 | Discharge: 2019-09-03 | Disposition: A | Discharge status: Home / Self Care | Payer: Medicare HMO | Source: Ambulatory Visit | Attending: Radiation Oncology | Admitting: Radiation Oncology

## 2019-09-03 VITALS — BP 126/90 | HR 81 | Temp 97.8°F | Wt 201.6 lb

## 2019-09-03 VITALS — BP 130/76 | HR 65

## 2019-09-03 DIAGNOSIS — C3492 Malignant neoplasm of unspecified part of left bronchus or lung: Secondary | ICD-10-CM

## 2019-09-03 DIAGNOSIS — C3412 Malignant neoplasm of upper lobe, left bronchus or lung: Secondary | ICD-10-CM | POA: Diagnosis not present

## 2019-09-03 DIAGNOSIS — Z5111 Encounter for antineoplastic chemotherapy: Secondary | ICD-10-CM | POA: Diagnosis not present

## 2019-09-03 DIAGNOSIS — Z510 Encounter for antineoplastic radiation therapy: Secondary | ICD-10-CM | POA: Diagnosis not present

## 2019-09-03 LAB — COMPREHENSIVE METABOLIC PANEL
ALT: 6 U/L (ref 0–44)
AST: 16 U/L (ref 15–41)
Albumin: 4.2 g/dL (ref 3.5–5.0)
Alkaline Phosphatase: 74 U/L (ref 38–126)
Anion gap: 6 (ref 5–15)
BUN: 13 mg/dL (ref 8–23)
CO2: 30 mmol/L (ref 22–32)
Calcium: 9.3 mg/dL (ref 8.9–10.3)
Chloride: 99 mmol/L (ref 98–111)
Creatinine, Ser: 0.76 mg/dL (ref 0.44–1.00)
GFR calc Af Amer: >60 mL/min (ref >60)
GFR calc non Af Amer: >60 mL/min (ref >60)
Glucose, Bld: 131 mg/dL — ABNORMAL HIGH (ref 70–99)
Potassium: 4.4 mmol/L (ref 3.5–5.1)
Sodium: 135 mmol/L (ref 135–145)
Total Bilirubin: 0.4 mg/dL (ref 0.3–1.2)
Total Protein: 7.2 g/dL (ref 6.5–8.1)

## 2019-09-03 LAB — CBC WITH DIFFERENTIAL/PLATELET
Abs Immature Granulocytes: 0.05 10*3/uL (ref 0.00–0.07)
Basophils Absolute: 0 10*3/uL (ref 0.0–0.1)
Basophils Relative: 1 %
Eosinophils Absolute: 0.1 10*3/uL (ref 0.0–0.5)
Eosinophils Relative: 1 %
HCT: 39.1 % (ref 36.0–46.0)
Hemoglobin: 12.6 g/dL (ref 12.0–15.0)
Immature Granulocytes: 1 %
Lymphocytes Relative: 9 %
Lymphs Abs: 0.5 10*3/uL — ABNORMAL LOW (ref 0.7–4.0)
MCH: 30 pg (ref 26.0–34.0)
MCHC: 32.2 g/dL (ref 30.0–36.0)
MCV: 93.1 fL (ref 80.0–100.0)
Monocytes Absolute: 0.4 10*3/uL (ref 0.1–1.0)
Monocytes Relative: 9 %
Neutro Abs: 3.9 10*3/uL (ref 1.7–7.7)
Neutrophils Relative %: 79 %
Platelets: 190 10*3/uL (ref 150–400)
RBC: 4.2 MIL/uL (ref 3.87–5.11)
RDW: 15.2 % (ref 11.5–15.5)
WBC: 4.9 10*3/uL (ref 4.0–10.5)
nRBC: 0 % (ref 0.0–0.2)

## 2019-09-03 MED ORDER — DEXAMETHASONE SODIUM PHOSPHATE 10 MG/ML IJ SOLN
10.0000 mg | Freq: Once | INTRAMUSCULAR | Status: AC
  Administered 2019-09-03: 10 mg via INTRAVENOUS
  Filled 2019-09-03: qty 1

## 2019-09-03 MED ORDER — SODIUM CHLORIDE 0.9 % IV SOLN
208.2000 mg | Freq: Once | INTRAVENOUS | Status: AC
  Administered 2019-09-03: 210 mg via INTRAVENOUS
  Filled 2019-09-03: qty 21

## 2019-09-03 MED ORDER — HEPARIN SOD (PORK) LOCK FLUSH 100 UNIT/ML IV SOLN
500.0000 [IU] | Freq: Once | INTRAVENOUS | Status: DC | PRN
  Filled 2019-09-03: qty 5

## 2019-09-03 MED ORDER — FAMOTIDINE IN NACL 20-0.9 MG/50ML-% IV SOLN
20.0000 mg | Freq: Once | INTRAVENOUS | Status: AC
  Administered 2019-09-03: 20 mg via INTRAVENOUS
  Filled 2019-09-03: qty 50

## 2019-09-03 MED ORDER — DIPHENHYDRAMINE HCL 50 MG/ML IJ SOLN
12.5000 mg | Freq: Once | INTRAMUSCULAR | Status: AC
  Administered 2019-09-03: 12.5 mg via INTRAVENOUS
  Filled 2019-09-03: qty 1

## 2019-09-03 MED ORDER — SODIUM CHLORIDE 0.9 % IV SOLN
Freq: Once | INTRAVENOUS | Status: AC
  Administered 2019-09-03: 10:00:00 via INTRAVENOUS
  Filled 2019-09-03: qty 250

## 2019-09-03 MED ORDER — PALONOSETRON HCL INJECTION 0.25 MG/5ML
0.2500 mg | Freq: Once | INTRAVENOUS | Status: AC
  Administered 2019-09-03: 0.25 mg via INTRAVENOUS
  Filled 2019-09-03: qty 5

## 2019-09-03 MED ORDER — SODIUM CHLORIDE 0.9 % IV SOLN
45.0000 mg/m2 | Freq: Once | INTRAVENOUS | Status: AC
  Administered 2019-09-03: 90 mg via INTRAVENOUS
  Filled 2019-09-03: qty 15

## 2019-09-04 ENCOUNTER — Other Ambulatory Visit: Payer: Self-pay

## 2019-09-04 ENCOUNTER — Ambulatory Visit
Admission: RE | Admit: 2019-09-04 | Discharge: 2019-09-04 | Disposition: A | Discharge status: Home / Self Care | Payer: Medicare HMO | Source: Ambulatory Visit | Attending: Radiation Oncology | Admitting: Radiation Oncology

## 2019-09-04 DIAGNOSIS — C3412 Malignant neoplasm of upper lobe, left bronchus or lung: Secondary | ICD-10-CM | POA: Diagnosis not present

## 2019-09-04 DIAGNOSIS — Z510 Encounter for antineoplastic radiation therapy: Secondary | ICD-10-CM | POA: Diagnosis not present

## 2019-09-04 NOTE — Progress Notes (Signed)
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  INTERVAL HISTORY: Patient returns to clinic today for further evaluation and consideration of cycle 6 of weekly carboplatinum and Taxol.  She continues to feel well and remains asymptomatic.  She is tolerating her treatments without significant side effects. She has chronic shortness of breath and requires oxygen 24 hours a day.  She has no neurologic complaints.  She denies any recent fevers or illnesses.  She has a good appetite and denies weight loss.  She denies any pain.  She has no chest pain, cough, or hemoptysis.  She denies any nausea, vomiting, constipation, or diarrhea.  She has no urinary complaints.  Patient offers no further specific complaints today.  REVIEW OF SYSTEMS:   Review of Systems  Constitutional: Negative.  Negative for fever, malaise/fatigue and weight loss.  Respiratory: Positive for shortness of breath. Negative for cough and hemoptysis.   Cardiovascular: Negative.  Negative for chest pain and leg swelling.  Gastrointestinal: Negative.  Negative for abdominal pain.  Genitourinary: Negative.  Negative for dysuria.  Musculoskeletal: Negative.  Negative for back pain.  Skin: Negative.  Negative for rash.  Neurological: Negative.  Negative for dizziness, weakness and headaches.  Psychiatric/Behavioral: Negative.  The patient is not nervous/anxious.     As per HPI. Otherwise, a complete review of systems is negative.  PAST MEDICAL HISTORY: Past Medical History:  Diagnosis Date  . Anxiety   . Asthma   . Cancer ([place]) 12/2018   w/u for right upper lobe mass/cancer  . COPD (chronic obstructive pulmonary disease) (HCC)    also, emphysema. now using o2 via np  24 hours a day  . Coronary artery disease   . Depression   . Diabetes mellitus, type II ([place])   . GERD (gastroesophageal reflux disease)   . Headache    migraines in early 20's  . Heart murmur   . Hypertension   . Lung cancer ([place])   . Neuropathy   . Restless leg     PAST SURGICAL HISTORY: Past Surgical History:  Procedure Laterality Date  . BACK SURGERY  2005   surgery x 2, disc fused in neck, pinched nerve in center of back and neck  . CARDIAC CATHETERIZATION  2015   1 stent placed for blockage  . cervical bone infusion    . CHOLECYSTECTOMY    . THORACIC DISC SURGERY    . TUBAL LIGATION    . VIDEO BRONCHOSCOPY WITH ENDOBRONCHIAL ULTRASOUND N/A 03/06/2019   Procedure: VIDEO BRONCHOSCOPY WITH ENDOBRONCHIAL ULTRASOUND - DIABETIC;  Surgeon: Ottie Glazier, MD;  Location: [facility] ORS;  Service: Thoracic;  Laterality: N/A;    FAMILY HISTORY: Family History  Problem Relation Age of Onset  . Anxiety disorder Mother   . Cancer - Lung Mother   . Depression Sister   . Cancer Sister   . Depression Brother   . Cancer Sister   . Cancer Sister   . Cancer Sister   . Heart Problems Sister   . Bipolar disorder Sister   . Diabetes Sister   . Cancer - Lung Sister   . Hypertension Sister   . Diabetes Sister   . Hyperlipidemia Sister   . COPD Sister   . Heart Problems Brother   .  Cancer Brother   . Cancer Brother   . Cancer Brother   . Breast cancer Maternal Aunt     ADVANCED DIRECTIVES (Y/N):  N  HEALTH MAINTENANCE: Social History   Tobacco Use  . Smoking status: Current Every Day Smoker    Packs/day: 1.00    Years: 40.00    Pack years: 40.00    Types: Cigarettes    Start date: 05/05/1975  . Smokeless tobacco: Never Used  Substance Use Topics  . Alcohol use: No    Alcohol/week: 0.0 standard drinks    Comment: socially  . Drug use: No     Colonoscopy:  PAP:  Bone density:  Lipid panel:  Allergies  Allergen Reactions  . Diclofenac-Misoprostol Other (See  Comments)    Arthrotec - gastritis   . Nsaids Other (See Comments)    gastritis  . Zolpidem Other (See Comments)    Sleep walking   . Hydrocodone-Acetaminophen Nausea And Vomiting  . Simvastatin Other (See Comments)    body aches    Current Outpatient Medications  Medication Sig Dispense Refill  . ACCU-CHEK FASTCLIX LANCETS MISC     . ACCU-CHEK SMARTVIEW test strip     . albuterol (PROVENTIL HFA) 108 (90 BASE) MCG/ACT inhaler Inhale 2 puffs into the lungs every 4 (four) hours as needed for wheezing or shortness of breath.     Marland Kitchen albuterol (PROVENTIL) (2.5 MG/3ML) 0.083% nebulizer solution Take 2.5 mg by nebulization every 6 (six) hours as needed for wheezing or shortness of breath.    Marland Kitchen amLODipine (NORVASC) 10 MG tablet Take 10 mg by mouth daily.     Marland Kitchen aspirin 81 MG chewable tablet Chew 1 tablet (81 mg total) by mouth daily. Continue holding now, as you are doing until procedure. (Patient taking differently: Chew 81 mg by mouth daily. ) 30 tablet 0  . carbidopa-levodopa (SINEMET IR) 25-100 MG tablet Take 1 tablet by mouth 3 (three) times daily.    . diclofenac sodium (VOLTAREN) 1 % GEL     . esomeprazole (NEXIUM) 40 MG capsule Take 40 mg by mouth daily.    . ferrous sulfate 325 (65 FE) MG tablet Take 1 tablet (325 mg total) by mouth 2 (two) times daily with a meal. 30 tablet 3  . fluticasone (FLONASE) 50 MCG/ACT nasal spray Place 2 sprays into both nostrils daily as needed for allergies.     . Fluticasone-Umeclidin-Vilant 100-62.5-25 MCG/INH AEPB Inhale 1 puff into the lungs daily. Treligy    . gabapentin (NEURONTIN) 100 MG capsule Take 1 capsule (100 mg total) by mouth 2 (two) times daily. QAM, QNOON (Patient taking differently: Take 200 mg by mouth daily. QAM) 180 capsule 2  . gabapentin (NEURONTIN) 400 MG capsule Take 1 capsule (400 mg total) by mouth 2 (two) times daily. 400 mg around 1300, and 2000 180 capsule 2  . ipratropium (ATROVENT) 0.02 % nebulizer solution Take 2.5 mLs (0.5 mg  total) by nebulization every 6 (six) hours as needed for wheezing or shortness of breath. 75 mL 12  . metFORMIN (GLUCOPHAGE) 500 MG tablet Take 500 mg by mouth daily with breakfast.     . metoprolol tartrate (LOPRESSOR) 25 MG tablet Take 25 mg by mouth 2 (two) times daily.     . mirabegron ER (MYRBETRIQ) 25 MG TB24 tablet Take 1 tablet by mouth daily.    . Multiple Vitamin (MULTIVITAMIN) tablet Take 1 tablet by mouth daily.    . ondansetron (ZOFRAN) 8 MG tablet Take 1 tablet (  8 mg total) by mouth 2 (two) times daily. 60 tablet 0  . OXYGEN Inhale 2 L into the lungs continuous.    . potassium chloride (KLOR-CON) 8 MEQ tablet Take 8 mEq by mouth daily.     . pravastatin (PRAVACHOL) 80 MG tablet Take 80 mg by mouth every evening.     . prochlorperazine (COMPAZINE) 10 MG tablet Take 1 tablet (10 mg total) by mouth every 6 (six) hours as needed (Nausea or vomiting). 60 tablet 2  . solifenacin (VESICARE) 10 MG tablet Take 10 mg by mouth daily.    Marland Kitchen spironolactone (ALDACTONE) 50 MG tablet Take 50 mg by mouth daily.     . sucralfate (CARAFATE) 1 g tablet Take 1 tablet (1 g total) by mouth 3 (three) times daily. Dissolve in 3-4 tbsp warm water, swish and swallow 90 tablet 3  . venlafaxine XR (EFFEXOR-XR) 150 MG 24 hr capsule Take 1 capsule (150 mg total) by mouth daily with breakfast. 90 capsule 1   No current facility-administered medications for this visit.    Facility-Administered Medications Ordered in Other Visits  Medication Dose Route Frequency Provider Last Rate Last Dose  . CARBOplatin (PARAPLATIN) 210 mg in sodium chloride 0.9 % 250 mL chemo infusion  210 mg Intravenous Once Lloyd Huger, MD 542 mL/hr at 09/09/19 1222 210 mg at 09/09/19 1222    OBJECTIVE: Vitals:   09/09/19 0953  BP: 130/79  Pulse: 90  Temp: 98.5 F (36.9 C)  SpO2: 99%     Body mass index is 35.18 kg/m.    ECOG FS:0 - Asymptomatic  General: Well-developed, well-nourished, no acute distress. Eyes: Pink  conjunctiva, anicteric sclera. HEENT: Normocephalic, moist mucous membranes. Lungs: Clear to auscultation bilaterally. Heart: Regular rate and rhythm. No rubs, murmurs, or gallops. Abdomen: Soft, nontender, nondistended. No organomegaly noted, normoactive bowel sounds. Musculoskeletal: No edema, cyanosis, or clubbing. Neuro: Alert, answering all questions appropriately. Cranial nerves grossly intact. Skin: No rashes or petechiae noted. Psych: Normal affect.  LAB RESULTS:  Lab Results  Component Value Date   NA 135 09/09/2019   K 4.1 09/09/2019   CL 101 09/09/2019   CO2 28 09/09/2019   GLUCOSE 165 (H) 09/09/2019   BUN 14 09/09/2019   CREATININE 0.75 09/09/2019   CALCIUM 9.0 09/09/2019   PROT 6.9 09/09/2019   ALBUMIN 4.1 09/09/2019   AST 17 09/09/2019   ALT 5 09/09/2019   ALKPHOS 67 09/09/2019   BILITOT 0.5 09/09/2019   GFRNONAA >60 09/09/2019   GFRAA >60 09/09/2019    Lab Results  Component Value Date   WBC 3.9 (L) 09/09/2019   NEUTROABS 3.3 09/09/2019   HGB 12.7 09/09/2019   HCT 39.4 09/09/2019   MCV 94.3 09/09/2019   PLT 144 (L) 09/09/2019     STUDIES: No results found.  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  PLAN:    1. Squamous cell carcinoma of the upper lobe of left lung: Imaging results reviewed independently and it was agreed upon the patient's final staging is considered IIb.  MRI of the brain on July 22, 2019 was reported as negative. She will benefit from current chemotherapy using weekly carboplatinum and Taxol along with daily XRT.  Because she is only stage II, she does not qualify for maintenance durvalumab.  Proceed with cycle 6 of weekly carboplatinum and Taxol today. Continue daily XRT completing on September 25, 2019.  Return to clinic in 1 week for further evaluation and  consideration of cycle 7. 2.  Shortness of breath: Chronic and unchanged.  Continue oxygen as prescribed. 3.  Thrombocytopenia: Mild,  monitor.  I spent a total of 30 minutes face-to-face with the patient of which greater than 50% of the visit was spent in counseling and coordination of care as detailed above.   Patient expressed understanding and was in agreement with this plan. She also understands that She can call clinic at any time with any questions, concerns, or complaints.   Cancer Staging Squamous cell carcinoma lung, left ([place]) Staging form: Lung, AJCC 8th Edition - Clinical stage from 07/26/2019: Stage IIB (cT3, cN0, cM0) - Signed by Lloyd Huger, MD on 07/26/2019   Lloyd Huger, MD   09/09/2019 12:34 PM

## 2019-09-07 ENCOUNTER — Ambulatory Visit
Admission: RE | Admit: 2019-09-07 | Discharge: 2019-09-07 | Disposition: A | Discharge status: Home / Self Care | Payer: Medicare HMO | Source: Ambulatory Visit | Attending: Radiation Oncology | Admitting: Radiation Oncology

## 2019-09-07 ENCOUNTER — Other Ambulatory Visit: Payer: Self-pay

## 2019-09-07 DIAGNOSIS — Z510 Encounter for antineoplastic radiation therapy: Secondary | ICD-10-CM | POA: Diagnosis not present

## 2019-09-07 DIAGNOSIS — C3412 Malignant neoplasm of upper lobe, left bronchus or lung: Secondary | ICD-10-CM | POA: Diagnosis not present

## 2019-09-07 NOTE — Progress Notes (Signed)
Patient is coming in for follow up, she is doing well. She has had some nausea.

## 2019-09-08 ENCOUNTER — Ambulatory Visit
Admission: RE | Admit: 2019-09-08 | Discharge: 2019-09-08 | Disposition: A | Discharge status: Home / Self Care | Payer: Medicare HMO | Source: Ambulatory Visit | Attending: Radiation Oncology | Admitting: Radiation Oncology

## 2019-09-08 ENCOUNTER — Other Ambulatory Visit: Payer: Self-pay

## 2019-09-08 DIAGNOSIS — C3412 Malignant neoplasm of upper lobe, left bronchus or lung: Secondary | ICD-10-CM | POA: Diagnosis not present

## 2019-09-08 DIAGNOSIS — Z510 Encounter for antineoplastic radiation therapy: Secondary | ICD-10-CM | POA: Diagnosis not present

## 2019-09-09 ENCOUNTER — Inpatient Hospital Stay: Payer: Medicare HMO

## 2019-09-09 ENCOUNTER — Other Ambulatory Visit: Payer: Self-pay

## 2019-09-09 ENCOUNTER — Inpatient Hospital Stay (HOSPITAL_BASED_OUTPATIENT_CLINIC_OR_DEPARTMENT_OTHER): Payer: Medicare HMO | Admitting: Oncology

## 2019-09-09 ENCOUNTER — Ambulatory Visit
Admission: RE | Admit: 2019-09-09 | Discharge: 2019-09-09 | Disposition: A | Discharge status: Home / Self Care | Payer: Medicare HMO | Source: Ambulatory Visit | Attending: Radiation Oncology | Admitting: Radiation Oncology

## 2019-09-09 VITALS — BP 130/79 | HR 90 | Temp 98.5°F | Wt 198.6 lb

## 2019-09-09 DIAGNOSIS — C3492 Malignant neoplasm of unspecified part of left bronchus or lung: Secondary | ICD-10-CM | POA: Diagnosis not present

## 2019-09-09 DIAGNOSIS — Z510 Encounter for antineoplastic radiation therapy: Secondary | ICD-10-CM | POA: Diagnosis not present

## 2019-09-09 DIAGNOSIS — C3412 Malignant neoplasm of upper lobe, left bronchus or lung: Secondary | ICD-10-CM | POA: Diagnosis not present

## 2019-09-09 DIAGNOSIS — Z5111 Encounter for antineoplastic chemotherapy: Secondary | ICD-10-CM | POA: Diagnosis not present

## 2019-09-09 LAB — COMPREHENSIVE METABOLIC PANEL
ALT: 5 U/L (ref 0–44)
AST: 17 U/L (ref 15–41)
Albumin: 4.1 g/dL (ref 3.5–5.0)
Alkaline Phosphatase: 67 U/L (ref 38–126)
Anion gap: 6 (ref 5–15)
BUN: 14 mg/dL (ref 8–23)
CO2: 28 mmol/L (ref 22–32)
Calcium: 9 mg/dL (ref 8.9–10.3)
Chloride: 101 mmol/L (ref 98–111)
Creatinine, Ser: 0.75 mg/dL (ref 0.44–1.00)
GFR calc Af Amer: >60 mL/min (ref >60)
GFR calc non Af Amer: >60 mL/min (ref >60)
Glucose, Bld: 165 mg/dL — ABNORMAL HIGH (ref 70–99)
Potassium: 4.1 mmol/L (ref 3.5–5.1)
Sodium: 135 mmol/L (ref 135–145)
Total Bilirubin: 0.5 mg/dL (ref 0.3–1.2)
Total Protein: 6.9 g/dL (ref 6.5–8.1)

## 2019-09-09 LAB — CBC WITH DIFFERENTIAL/PLATELET
Abs Immature Granulocytes: 0.05 10*3/uL (ref 0.00–0.07)
Basophils Absolute: 0 10*3/uL (ref 0.0–0.1)
Basophils Relative: 1 %
Eosinophils Absolute: 0 10*3/uL (ref 0.0–0.5)
Eosinophils Relative: 1 %
HCT: 39.4 % (ref 36.0–46.0)
Hemoglobin: 12.7 g/dL (ref 12.0–15.0)
Immature Granulocytes: 1 %
Lymphocytes Relative: 8 %
Lymphs Abs: 0.3 10*3/uL — ABNORMAL LOW (ref 0.7–4.0)
MCH: 30.4 pg (ref 26.0–34.0)
MCHC: 32.2 g/dL (ref 30.0–36.0)
MCV: 94.3 fL (ref 80.0–100.0)
Monocytes Absolute: 0.2 10*3/uL (ref 0.1–1.0)
Monocytes Relative: 5 %
Neutro Abs: 3.3 10*3/uL (ref 1.7–7.7)
Neutrophils Relative %: 84 %
Platelets: 144 10*3/uL — ABNORMAL LOW (ref 150–400)
RBC: 4.18 MIL/uL (ref 3.87–5.11)
RDW: 15.1 % (ref 11.5–15.5)
WBC: 3.9 10*3/uL — ABNORMAL LOW (ref 4.0–10.5)
nRBC: 0 % (ref 0.0–0.2)

## 2019-09-09 MED ORDER — DIPHENHYDRAMINE HCL 50 MG/ML IJ SOLN
12.5000 mg | Freq: Once | INTRAMUSCULAR | Status: AC
  Administered 2019-09-09: 12.5 mg via INTRAVENOUS
  Filled 2019-09-09: qty 1

## 2019-09-09 MED ORDER — PALONOSETRON HCL INJECTION 0.25 MG/5ML
0.2500 mg | Freq: Once | INTRAVENOUS | Status: AC
  Administered 2019-09-09: 0.25 mg via INTRAVENOUS
  Filled 2019-09-09: qty 5

## 2019-09-09 MED ORDER — FAMOTIDINE IN NACL 20-0.9 MG/50ML-% IV SOLN
20.0000 mg | Freq: Once | INTRAVENOUS | Status: AC
  Administered 2019-09-09: 20 mg via INTRAVENOUS
  Filled 2019-09-09: qty 50

## 2019-09-09 MED ORDER — DEXAMETHASONE SODIUM PHOSPHATE 10 MG/ML IJ SOLN
10.0000 mg | Freq: Once | INTRAMUSCULAR | Status: AC
  Administered 2019-09-09: 10 mg via INTRAVENOUS
  Filled 2019-09-09: qty 1

## 2019-09-09 MED ORDER — SODIUM CHLORIDE 0.9 % IV SOLN
Freq: Once | INTRAVENOUS | Status: AC
  Administered 2019-09-09: 11:00:00 via INTRAVENOUS
  Filled 2019-09-09: qty 250

## 2019-09-09 MED ORDER — SODIUM CHLORIDE 0.9 % IV SOLN
208.2000 mg | Freq: Once | INTRAVENOUS | Status: AC
  Administered 2019-09-09: 210 mg via INTRAVENOUS
  Filled 2019-09-09: qty 21

## 2019-09-09 MED ORDER — SODIUM CHLORIDE 0.9 % IV SOLN
45.0000 mg/m2 | Freq: Once | INTRAVENOUS | Status: AC
  Administered 2019-09-09: 90 mg via INTRAVENOUS
  Filled 2019-09-09: qty 15

## 2019-09-09 MED ORDER — SODIUM CHLORIDE 0.9 % IV SOLN: 208.2000 mg | Freq: Once | INTRAVENOUS | Status: DC

## 2019-09-09 NOTE — Progress Notes (Signed)
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  INTERVAL HISTORY: Patient returns to clinic today for further evaluation and consideration of cycle 7 of weekly carboplatin and Taxol.  She continues to tolerate her treatments well without significant side effects.  She currently feels well and is at her baseline. She has chronic shortness of breath and requires oxygen 24 hours a day.  She has no neurologic complaints.  She denies any recent fevers or illnesses.  She has a good appetite and denies weight loss.  She denies any pain.  She has no chest pain, cough, or hemoptysis.  She denies any nausea, vomiting, constipation, or diarrhea.  She has no urinary complaints.  Patient offers no further specific complaints today.  REVIEW OF SYSTEMS:   Review of Systems  Constitutional: Negative.  Negative for fever, malaise/fatigue and weight loss.  Respiratory: Positive for shortness of breath. Negative for cough and hemoptysis.   Cardiovascular: Negative.  Negative for chest pain and leg swelling.  Gastrointestinal: Negative.  Negative for abdominal pain.  Genitourinary: Negative.  Negative for dysuria.  Musculoskeletal: Negative.  Negative for back pain.  Skin: Negative.  Negative for rash.  Neurological: Negative.  Negative for dizziness, weakness and headaches.  Psychiatric/Behavioral: Negative.  The patient is not nervous/anxious.     As per HPI. Otherwise, a complete review of systems is negative.  PAST MEDICAL HISTORY: Past Medical History:  Diagnosis Date  . Anxiety   . Asthma   . Cancer ([place]) 12/2018   w/u for right upper lobe mass/cancer  . COPD (chronic obstructive pulmonary disease) (HCC)    also, emphysema. now using o2  via np 24 hours a day  . Coronary artery disease   . Depression   . Diabetes mellitus, type II ([place])   . GERD (gastroesophageal reflux disease)   . Headache    migraines in early 20's  . Heart murmur   . Hypertension   . Lung cancer ([place])   . Neuropathy   . Restless leg     PAST SURGICAL HISTORY: Past Surgical History:  Procedure Laterality Date  . BACK SURGERY  2005   surgery x 2, disc fused in neck, pinched nerve in center of back and neck  . CARDIAC CATHETERIZATION  2015   1 stent placed for blockage  . cervical bone infusion    . CHOLECYSTECTOMY    . THORACIC DISC SURGERY    . TUBAL LIGATION    . VIDEO BRONCHOSCOPY WITH ENDOBRONCHIAL ULTRASOUND N/A 03/06/2019   Procedure: VIDEO BRONCHOSCOPY WITH ENDOBRONCHIAL ULTRASOUND - DIABETIC;  Surgeon: Ottie Glazier, MD;  Location: [facility] ORS;  Service: Thoracic;  Laterality: N/A;    FAMILY HISTORY: Family History  Problem Relation Age of Onset  . Anxiety disorder Mother   . Cancer - Lung Mother   . Depression Sister   . Cancer Sister   . Depression Brother   . Cancer Sister   . Cancer Sister   . Cancer Sister   . Heart Problems Sister   . Bipolar disorder Sister   . Diabetes Sister   . Cancer - Lung Sister   . Hypertension Sister   . Diabetes Sister   . Hyperlipidemia Sister   . COPD Sister   . Heart Problems Brother   .  Cancer Brother   . Cancer Brother   . Cancer Brother   . Breast cancer Maternal Aunt     ADVANCED DIRECTIVES (Y/N):  N  HEALTH MAINTENANCE: Social History   Tobacco Use  . Smoking status: Current Every Day Smoker    Packs/day: 1.00    Years: 40.00    Pack years: 40.00    Types: Cigarettes    Start date: 05/05/1975  . Smokeless tobacco: Never Used  Substance Use Topics  . Alcohol use: No    Alcohol/week: 0.0 standard drinks    Comment: socially  . Drug use: No     Colonoscopy:  PAP:  Bone density:  Lipid panel:  Allergies  Allergen Reactions  . Diclofenac-Misoprostol Other (See  Comments)    Arthrotec - gastritis   . Nsaids Other (See Comments)    gastritis  . Zolpidem Other (See Comments)    Sleep walking   . Hydrocodone-Acetaminophen Nausea And Vomiting  . Simvastatin Other (See Comments)    body aches    Current Outpatient Medications  Medication Sig Dispense Refill  . ACCU-CHEK FASTCLIX LANCETS MISC     . ACCU-CHEK SMARTVIEW test strip     . albuterol (PROVENTIL HFA) 108 (90 BASE) MCG/ACT inhaler Inhale 2 puffs into the lungs every 4 (four) hours as needed for wheezing or shortness of breath.     Marland Kitchen albuterol (PROVENTIL) (2.5 MG/3ML) 0.083% nebulizer solution Take 2.5 mg by nebulization every 6 (six) hours as needed for wheezing or shortness of breath.    Marland Kitchen amLODipine (NORVASC) 10 MG tablet Take 10 mg by mouth daily.     Marland Kitchen aspirin 81 MG chewable tablet Chew 1 tablet (81 mg total) by mouth daily. Continue holding now, as you are doing until procedure. (Patient taking differently: Chew 81 mg by mouth daily. ) 30 tablet 0  . carbidopa-levodopa (SINEMET IR) 25-100 MG tablet Take 1 tablet by mouth 3 (three) times daily.    . diclofenac sodium (VOLTAREN) 1 % GEL     . esomeprazole (NEXIUM) 40 MG capsule Take 40 mg by mouth daily.    . ferrous sulfate 325 (65 FE) MG tablet Take 1 tablet (325 mg total) by mouth 2 (two) times daily with a meal. 30 tablet 3  . fluticasone (FLONASE) 50 MCG/ACT nasal spray Place 2 sprays into both nostrils daily as needed for allergies.     . Fluticasone-Umeclidin-Vilant 100-62.5-25 MCG/INH AEPB Inhale 1 puff into the lungs daily. Treligy    . gabapentin (NEURONTIN) 100 MG capsule Take 1 capsule (100 mg total) by mouth 2 (two) times daily. QAM, QNOON (Patient taking differently: Take 200 mg by mouth daily. QAM) 180 capsule 2  . gabapentin (NEURONTIN) 400 MG capsule Take 1 capsule (400 mg total) by mouth 2 (two) times daily. 400 mg around 1300, and 2000 180 capsule 2  . ipratropium (ATROVENT) 0.02 % nebulizer solution Take 2.5 mLs (0.5 mg  total) by nebulization every 6 (six) hours as needed for wheezing or shortness of breath. 75 mL 12  . metFORMIN (GLUCOPHAGE) 500 MG tablet Take 500 mg by mouth daily with breakfast.     . metoprolol tartrate (LOPRESSOR) 25 MG tablet Take 25 mg by mouth 2 (two) times daily.     . mirabegron ER (MYRBETRIQ) 25 MG TB24 tablet Take 1 tablet by mouth daily.    . Multiple Vitamin (MULTIVITAMIN) tablet Take 1 tablet by mouth daily.    . ondansetron (ZOFRAN) 8 MG tablet Take 1 tablet (  8 mg total) by mouth 2 (two) times daily. 60 tablet 0  . OXYGEN Inhale 2 L into the lungs continuous.    . potassium chloride (KLOR-CON) 8 MEQ tablet Take 8 mEq by mouth daily.     . pravastatin (PRAVACHOL) 80 MG tablet Take 80 mg by mouth every evening.     . prochlorperazine (COMPAZINE) 10 MG tablet Take 1 tablet (10 mg total) by mouth every 6 (six) hours as needed (Nausea or vomiting). 60 tablet 2  . solifenacin (VESICARE) 10 MG tablet Take 10 mg by mouth daily.    Marland Kitchen spironolactone (ALDACTONE) 50 MG tablet Take 50 mg by mouth daily.     . sucralfate (CARAFATE) 1 g tablet Take 1 tablet (1 g total) by mouth 3 (three) times daily. Dissolve in 3-4 tbsp warm water, swish and swallow 90 tablet 3  . venlafaxine XR (EFFEXOR-XR) 150 MG 24 hr capsule Take 1 capsule (150 mg total) by mouth daily with breakfast. 90 capsule 1   No current facility-administered medications for this visit.     OBJECTIVE: Vitals:   09/17/19 1135  BP: 138/73  Pulse: 98  Resp: 18  Temp: 97.7 F (36.5 C)  SpO2: 97%     Body mass index is 34.99 kg/m.    ECOG FS:0 - Asymptomatic  General: Well-developed, well-nourished, no acute distress. Eyes: Pink conjunctiva, anicteric sclera. HEENT: Normocephalic, moist mucous membranes. Lungs: Clear to auscultation bilaterally. Heart: Regular rate and rhythm. No rubs, murmurs, or gallops. Abdomen: Soft, nontender, nondistended. No organomegaly noted, normoactive bowel sounds. Musculoskeletal: No edema,  cyanosis, or clubbing. Neuro: Alert, answering all questions appropriately. Cranial nerves grossly intact. Skin: No rashes or petechiae noted. Psych: Normal affect.  LAB RESULTS:  Lab Results  Component Value Date   NA 138 09/17/2019   K 4.1 09/17/2019   CL 102 09/17/2019   CO2 27 09/17/2019   GLUCOSE 172 (H) 09/17/2019   BUN 15 09/17/2019   CREATININE 0.79 09/17/2019   CALCIUM 9.3 09/17/2019   PROT 6.8 09/17/2019   ALBUMIN 3.9 09/17/2019   AST 16 09/17/2019   ALT <5 09/17/2019   ALKPHOS 72 09/17/2019   BILITOT 0.4 09/17/2019   GFRNONAA >60 09/17/2019   GFRAA >60 09/17/2019    Lab Results  Component Value Date   WBC 2.7 (L) 09/17/2019   NEUTROABS 2.2 09/17/2019   HGB 12.0 09/17/2019   HCT 38.1 09/17/2019   MCV 95.3 09/17/2019   PLT 178 09/17/2019     STUDIES: No results found.  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  PLAN:    1. Squamous cell carcinoma of the upper lobe of left lung: Imaging results reviewed independently and it was agreed upon the patient's final staging is considered IIb.  MRI of the brain on July 22, 2019 was reported as negative. She will benefit from current chemotherapy using weekly carboplatinum and Taxol along with daily XRT.  Because she is only stage II, she does not qualify for maintenance durvalumab.  Proceed with cycle 7 of weekly carboplatin and Taxol today. Continue daily XRT completing on September 25, 2019.  Patient will return to clinic in 1 week for further evaluation and consideration of her 8th and final cycle of carboplatin and Taxol.  Plan to reimage with PET scan approximately 2 months after the conclusion of her XRT. 2.  Shortness of breath: Chronic and unchanged.  Continue oxygen as prescribed. 3.  Thrombocytopenia: Resolved. 4.  Leukopenia: Mild,  monitor.  Patient expressed understanding and was in agreement with this plan. She also understands that She can call clinic at any time  with any questions, concerns, or complaints.   Cancer Staging Squamous cell carcinoma lung, left ([place]) Staging form: Lung, AJCC 8th Edition - Clinical stage from 07/26/2019: Stage IIB (cT3, cN0, cM0) - Signed by Lloyd Huger, MD on 07/26/2019   Lloyd Huger, MD   09/18/2019 1:01 PM

## 2019-09-14 ENCOUNTER — Other Ambulatory Visit: Payer: Self-pay

## 2019-09-14 ENCOUNTER — Ambulatory Visit
Admission: RE | Admit: 2019-09-14 | Discharge: 2019-09-14 | Disposition: A | Discharge status: Home / Self Care | Payer: Medicare HMO | Source: Ambulatory Visit | Attending: Radiation Oncology | Admitting: Radiation Oncology

## 2019-09-14 DIAGNOSIS — Z510 Encounter for antineoplastic radiation therapy: Secondary | ICD-10-CM | POA: Diagnosis not present

## 2019-09-14 DIAGNOSIS — C3412 Malignant neoplasm of upper lobe, left bronchus or lung: Secondary | ICD-10-CM | POA: Diagnosis not present

## 2019-09-15 ENCOUNTER — Other Ambulatory Visit: Payer: Self-pay

## 2019-09-15 ENCOUNTER — Ambulatory Visit
Admission: RE | Admit: 2019-09-15 | Discharge: 2019-09-15 | Disposition: A | Discharge status: Home / Self Care | Payer: Medicare HMO | Source: Ambulatory Visit | Attending: Radiation Oncology | Admitting: Radiation Oncology

## 2019-09-15 DIAGNOSIS — Z510 Encounter for antineoplastic radiation therapy: Secondary | ICD-10-CM | POA: Diagnosis not present

## 2019-09-15 DIAGNOSIS — C3412 Malignant neoplasm of upper lobe, left bronchus or lung: Secondary | ICD-10-CM | POA: Insufficient documentation

## 2019-09-16 ENCOUNTER — Other Ambulatory Visit: Payer: Self-pay

## 2019-09-16 ENCOUNTER — Ambulatory Visit
Admission: RE | Admit: 2019-09-16 | Discharge: 2019-09-16 | Disposition: A | Discharge status: Home / Self Care | Payer: Medicare HMO | Source: Ambulatory Visit | Attending: Radiation Oncology | Admitting: Radiation Oncology

## 2019-09-16 DIAGNOSIS — Z510 Encounter for antineoplastic radiation therapy: Secondary | ICD-10-CM | POA: Diagnosis not present

## 2019-09-16 DIAGNOSIS — C3412 Malignant neoplasm of upper lobe, left bronchus or lung: Secondary | ICD-10-CM | POA: Diagnosis not present

## 2019-09-16 NOTE — Progress Notes (Signed)
Patient pre screened for office appointment, no questions or concerns today. Patient reminded of upcoming appointment time and date. 

## 2019-09-17 ENCOUNTER — Ambulatory Visit
Admission: RE | Admit: 2019-09-17 | Discharge: 2019-09-17 | Disposition: A | Discharge status: Home / Self Care | Payer: Medicare HMO | Source: Ambulatory Visit | Attending: Radiation Oncology | Admitting: Radiation Oncology

## 2019-09-17 ENCOUNTER — Other Ambulatory Visit: Payer: Self-pay

## 2019-09-17 ENCOUNTER — Inpatient Hospital Stay: Payer: Medicare HMO

## 2019-09-17 ENCOUNTER — Inpatient Hospital Stay: Payer: Medicare HMO | Attending: Oncology

## 2019-09-17 ENCOUNTER — Inpatient Hospital Stay (HOSPITAL_BASED_OUTPATIENT_CLINIC_OR_DEPARTMENT_OTHER): Payer: Medicare HMO | Admitting: Oncology

## 2019-09-17 VITALS — BP 138/73 | HR 98 | Temp 97.7°F | Resp 18 | Wt 197.5 lb

## 2019-09-17 DIAGNOSIS — C3492 Malignant neoplasm of unspecified part of left bronchus or lung: Secondary | ICD-10-CM | POA: Diagnosis not present

## 2019-09-17 DIAGNOSIS — C3412 Malignant neoplasm of upper lobe, left bronchus or lung: Secondary | ICD-10-CM | POA: Diagnosis not present

## 2019-09-17 DIAGNOSIS — Z510 Encounter for antineoplastic radiation therapy: Secondary | ICD-10-CM | POA: Diagnosis not present

## 2019-09-17 DIAGNOSIS — Z5111 Encounter for antineoplastic chemotherapy: Secondary | ICD-10-CM | POA: Diagnosis not present

## 2019-09-17 DIAGNOSIS — F17210 Nicotine dependence, cigarettes, uncomplicated: Secondary | ICD-10-CM | POA: Diagnosis not present

## 2019-09-17 LAB — COMPREHENSIVE METABOLIC PANEL
ALT: <5 U/L (ref 0–44)
AST: 16 U/L (ref 15–41)
Albumin: 3.9 g/dL (ref 3.5–5.0)
Alkaline Phosphatase: 72 U/L (ref 38–126)
Anion gap: 9 (ref 5–15)
BUN: 15 mg/dL (ref 8–23)
CO2: 27 mmol/L (ref 22–32)
Calcium: 9.3 mg/dL (ref 8.9–10.3)
Chloride: 102 mmol/L (ref 98–111)
Creatinine, Ser: 0.79 mg/dL (ref 0.44–1.00)
GFR calc Af Amer: >60 mL/min (ref >60)
GFR calc non Af Amer: >60 mL/min (ref >60)
Glucose, Bld: 172 mg/dL — ABNORMAL HIGH (ref 70–99)
Potassium: 4.1 mmol/L (ref 3.5–5.1)
Sodium: 138 mmol/L (ref 135–145)
Total Bilirubin: 0.4 mg/dL (ref 0.3–1.2)
Total Protein: 6.8 g/dL (ref 6.5–8.1)

## 2019-09-17 LAB — CBC WITH DIFFERENTIAL/PLATELET
Abs Immature Granulocytes: 0.01 10*3/uL (ref 0.00–0.07)
Basophils Absolute: 0 10*3/uL (ref 0.0–0.1)
Basophils Relative: 0 %
Eosinophils Absolute: 0 10*3/uL (ref 0.0–0.5)
Eosinophils Relative: 1 %
HCT: 38.1 % (ref 36.0–46.0)
Hemoglobin: 12 g/dL (ref 12.0–15.0)
Immature Granulocytes: 0 %
Lymphocytes Relative: 8 %
Lymphs Abs: 0.2 10*3/uL — ABNORMAL LOW (ref 0.7–4.0)
MCH: 30 pg (ref 26.0–34.0)
MCHC: 31.5 g/dL (ref 30.0–36.0)
MCV: 95.3 fL (ref 80.0–100.0)
Monocytes Absolute: 0.2 10*3/uL (ref 0.1–1.0)
Monocytes Relative: 7 %
Neutro Abs: 2.2 10*3/uL (ref 1.7–7.7)
Neutrophils Relative %: 84 %
Platelets: 178 10*3/uL (ref 150–400)
RBC: 4 MIL/uL (ref 3.87–5.11)
RDW: 15.4 % (ref 11.5–15.5)
WBC: 2.7 10*3/uL — ABNORMAL LOW (ref 4.0–10.5)
nRBC: 0 % (ref 0.0–0.2)

## 2019-09-17 MED ORDER — SODIUM CHLORIDE 0.9 % IV SOLN
Freq: Once | INTRAVENOUS | Status: AC
  Administered 2019-09-17: 12:00:00 via INTRAVENOUS
  Filled 2019-09-17: qty 250

## 2019-09-17 MED ORDER — DIPHENHYDRAMINE HCL 50 MG/ML IJ SOLN
12.5000 mg | Freq: Once | INTRAMUSCULAR | Status: AC
  Administered 2019-09-17: 12.5 mg via INTRAVENOUS
  Filled 2019-09-17: qty 1

## 2019-09-17 MED ORDER — SODIUM CHLORIDE 0.9 % IV SOLN
208.2000 mg | Freq: Once | INTRAVENOUS | Status: AC
  Administered 2019-09-17: 210 mg via INTRAVENOUS
  Filled 2019-09-17: qty 21

## 2019-09-17 MED ORDER — FAMOTIDINE IN NACL 20-0.9 MG/50ML-% IV SOLN
20.0000 mg | Freq: Once | INTRAVENOUS | Status: AC
  Administered 2019-09-17: 20 mg via INTRAVENOUS
  Filled 2019-09-17: qty 50

## 2019-09-17 MED ORDER — DEXAMETHASONE SODIUM PHOSPHATE 10 MG/ML IJ SOLN
10.0000 mg | Freq: Once | INTRAMUSCULAR | Status: AC
  Administered 2019-09-17: 10 mg via INTRAVENOUS
  Filled 2019-09-17: qty 1

## 2019-09-17 MED ORDER — PALONOSETRON HCL INJECTION 0.25 MG/5ML
0.2500 mg | Freq: Once | INTRAVENOUS | Status: AC
  Administered 2019-09-17: 0.25 mg via INTRAVENOUS
  Filled 2019-09-17: qty 5

## 2019-09-17 MED ORDER — SODIUM CHLORIDE 0.9 % IV SOLN
45.0000 mg/m2 | Freq: Once | INTRAVENOUS | Status: AC
  Administered 2019-09-17: 90 mg via INTRAVENOUS
  Filled 2019-09-17: qty 15

## 2019-09-17 NOTE — Progress Notes (Signed)
Patient is here for a follow-up today with a complaint of her scalp burning.  Patient says she feels like there are sores on her scalp but she can't see any.

## 2019-09-18 ENCOUNTER — Ambulatory Visit
Admission: RE | Admit: 2019-09-18 | Discharge: 2019-09-18 | Disposition: A | Discharge status: Home / Self Care | Payer: Medicare HMO | Source: Ambulatory Visit | Attending: Radiation Oncology | Admitting: Radiation Oncology

## 2019-09-18 ENCOUNTER — Other Ambulatory Visit: Payer: Self-pay

## 2019-09-18 DIAGNOSIS — Z510 Encounter for antineoplastic radiation therapy: Secondary | ICD-10-CM | POA: Diagnosis not present

## 2019-09-18 DIAGNOSIS — C3412 Malignant neoplasm of upper lobe, left bronchus or lung: Secondary | ICD-10-CM | POA: Diagnosis not present

## 2019-09-20 NOTE — Progress Notes (Signed)
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  INTERVAL HISTORY: Patient returns to clinic today for further evaluation and consideration of her eighth and final cycle of weekly carboplatinum and Taxol.  She continues to tolerate her treatments well without significant side effects.  She has chronic shortness of breath and requires oxygen 24 hours a day.  She currently feels well and is at her baseline. She has no neurologic complaints.  She denies any recent fevers or illnesses.  She has a good appetite and denies weight loss.  She denies any pain.  She has no chest pain, cough, or hemoptysis.  She denies any nausea, vomiting, constipation, or diarrhea.  She has no urinary complaints. Patient offers no further specific complaints today.  REVIEW OF SYSTEMS:   Review of Systems  Constitutional: Negative.  Negative for fever, malaise/fatigue and weight loss.  Respiratory: Positive for shortness of breath. Negative for cough and hemoptysis.   Cardiovascular: Negative.  Negative for chest pain and leg swelling.  Gastrointestinal: Negative.  Negative for abdominal pain.  Genitourinary: Negative.  Negative for dysuria.  Musculoskeletal: Negative.  Negative for back pain.  Skin: Negative.  Negative for rash.  Neurological: Negative.  Negative for dizziness, weakness and headaches.  Psychiatric/Behavioral: Negative.  The patient is not nervous/anxious.     As per HPI. Otherwise, a complete review of systems is negative.  PAST MEDICAL HISTORY: Past Medical History:  Diagnosis Date  . Anxiety   . Asthma   . Cancer ([place]) 12/2018   w/u for right upper lobe mass/cancer  . COPD (chronic obstructive pulmonary disease) (HCC)    also,  emphysema. now using o2 via np 24 hours a day  . Coronary artery disease   . Depression   . Diabetes mellitus, type II ([place])   . GERD (gastroesophageal reflux disease)   . Headache    migraines in early 20's  . Heart murmur   . Hypertension   . Lung cancer ([place])   . Neuropathy   . Restless leg     PAST SURGICAL HISTORY: Past Surgical History:  Procedure Laterality Date  . BACK SURGERY  2005   surgery x 2, disc fused in neck, pinched nerve in center of back and neck  . CARDIAC CATHETERIZATION  2015   1 stent placed for blockage  . cervical bone infusion    . CHOLECYSTECTOMY    . THORACIC DISC SURGERY    . TUBAL LIGATION    . VIDEO BRONCHOSCOPY WITH ENDOBRONCHIAL ULTRASOUND N/A 03/06/2019   Procedure: VIDEO BRONCHOSCOPY WITH ENDOBRONCHIAL ULTRASOUND - DIABETIC;  Surgeon: Ottie Glazier, MD;  Location: [facility] ORS;  Service: Thoracic;  Laterality: N/A;    FAMILY HISTORY: Family History  Problem Relation Age of Onset  . Anxiety disorder Mother   . Cancer - Lung Mother   . Depression Sister   . Cancer Sister   . Depression Brother   . Cancer Sister   . Cancer Sister   . Cancer Sister   . Heart Problems Sister   . Bipolar disorder Sister   . Diabetes Sister   . Cancer - Lung Sister   . Hypertension Sister   . Diabetes Sister   . Hyperlipidemia Sister   . COPD Sister   . Heart  Problems Brother   . Cancer Brother   . Cancer Brother   . Cancer Brother   . Breast cancer Maternal Aunt     ADVANCED DIRECTIVES (Y/N):  N  HEALTH MAINTENANCE: Social History   Tobacco Use  . Smoking status: Current Every Day Smoker    Packs/day: 1.00    Years: 40.00    Pack years: 40.00    Types: Cigarettes    Start date: 05/05/1975  . Smokeless tobacco: Never Used  Substance Use Topics  . Alcohol use: No    Alcohol/week: 0.0 standard drinks    Comment: socially  . Drug use: No     Colonoscopy:  PAP:  Bone density:  Lipid panel:  Allergies  Allergen Reactions  .  Diclofenac-Misoprostol Other (See Comments)    Arthrotec - gastritis   . Nsaids Other (See Comments)    gastritis  . Zolpidem Other (See Comments)    Sleep walking   . Hydrocodone-Acetaminophen Nausea And Vomiting  . Simvastatin Other (See Comments)    body aches    Current Outpatient Medications  Medication Sig Dispense Refill  . ACCU-CHEK FASTCLIX LANCETS MISC     . ACCU-CHEK SMARTVIEW test strip     . albuterol (PROVENTIL HFA) 108 (90 BASE) MCG/ACT inhaler Inhale 2 puffs into the lungs every 4 (four) hours as needed for wheezing or shortness of breath.     Marland Kitchen albuterol (PROVENTIL) (2.5 MG/3ML) 0.083% nebulizer solution Take 2.5 mg by nebulization every 6 (six) hours as needed for wheezing or shortness of breath.    Marland Kitchen amLODipine (NORVASC) 10 MG tablet Take 10 mg by mouth daily.     Marland Kitchen aspirin 81 MG chewable tablet Chew 1 tablet (81 mg total) by mouth daily. Continue holding now, as you are doing until procedure. (Patient taking differently: Chew 81 mg by mouth daily. ) 30 tablet 0  . carbidopa-levodopa (SINEMET IR) 25-100 MG tablet Take 1 tablet by mouth 3 (three) times daily.    . diclofenac sodium (VOLTAREN) 1 % GEL     . esomeprazole (NEXIUM) 40 MG capsule Take 40 mg by mouth daily.    . ferrous sulfate 325 (65 FE) MG tablet Take 1 tablet (325 mg total) by mouth 2 (two) times daily with a meal. 30 tablet 3  . fluticasone (FLONASE) 50 MCG/ACT nasal spray Place 2 sprays into both nostrils daily as needed for allergies.     . Fluticasone-Umeclidin-Vilant 100-62.5-25 MCG/INH AEPB Inhale 1 puff into the lungs daily. Treligy    . gabapentin (NEURONTIN) 100 MG capsule Take 1 capsule (100 mg total) by mouth 2 (two) times daily. QAM, QNOON (Patient taking differently: Take 200 mg by mouth daily. QAM) 180 capsule 2  . gabapentin (NEURONTIN) 100 MG capsule Take 2 capsules in the morning 180 capsule 1  . gabapentin (NEURONTIN) 400 MG capsule Take 1 capsule (400 mg total) by mouth 2 (two) times  daily. Take 1 capsule at 1pm and take 1 capsule at 8pm 180 capsule 1  . ipratropium (ATROVENT) 0.02 % nebulizer solution Take 2.5 mLs (0.5 mg total) by nebulization every 6 (six) hours as needed for wheezing or shortness of breath. 75 mL 12  . metFORMIN (GLUCOPHAGE) 500 MG tablet Take 500 mg by mouth daily with breakfast.     . metoprolol tartrate (LOPRESSOR) 25 MG tablet Take 25 mg by mouth 2 (two) times daily.     . mirabegron ER (MYRBETRIQ) 25 MG TB24 tablet Take 1 tablet by mouth daily.    Marland Kitchen  Multiple Vitamin (MULTIVITAMIN) tablet Take 1 tablet by mouth daily.    . ondansetron (ZOFRAN) 8 MG tablet Take 1 tablet (8 mg total) by mouth 2 (two) times daily. 60 tablet 0  . OXYGEN Inhale 2 L into the lungs continuous.    . potassium chloride (KLOR-CON) 8 MEQ tablet Take 8 mEq by mouth daily.     . pravastatin (PRAVACHOL) 80 MG tablet Take 80 mg by mouth every evening.     . prochlorperazine (COMPAZINE) 10 MG tablet Take 1 tablet (10 mg total) by mouth every 6 (six) hours as needed (Nausea or vomiting). 60 tablet 2  . solifenacin (VESICARE) 10 MG tablet Take 10 mg by mouth daily.    Marland Kitchen spironolactone (ALDACTONE) 50 MG tablet Take 50 mg by mouth daily.     . sucralfate (CARAFATE) 1 g tablet Take 1 tablet (1 g total) by mouth 3 (three) times daily. Dissolve in 3-4 tbsp warm water, swish and swallow 90 tablet 3  . temazepam (RESTORIL) 15 MG capsule Take 1 capsule (15 mg total) by mouth at bedtime as needed for sleep. 90 capsule 0  . venlafaxine XR (EFFEXOR-XR) 150 MG 24 hr capsule Take 1 capsule (150 mg total) by mouth daily with breakfast. 90 capsule 1   No current facility-administered medications for this visit.    OBJECTIVE: Vitals:   09/24/19 0855  BP: 136/75  Pulse: 90  Resp: 18  Temp: 97.9 F (36.6 C)  SpO2: 99%     Body mass index is 35.78 kg/m.    ECOG FS:0 - Asymptomatic  General: Well-developed, well-nourished, no acute distress. Eyes: Pink conjunctiva, anicteric sclera. HEENT:  Normocephalic, moist mucous membranes. Lungs: No audible wheezing or coughing. Heart: Regular rate and rhythm. Abdomen: Soft, nontender, no obvious distention. Musculoskeletal: No edema, cyanosis, or clubbing. Neuro: Alert, answering all questions appropriately. Cranial nerves grossly intact. Skin: No rashes or petechiae noted. Psych: Normal affect.   LAB RESULTS:  Lab Results  Component Value Date   NA 136 09/24/2019   K 4.2 09/24/2019   CL 99 09/24/2019   CO2 29 09/24/2019   GLUCOSE 127 (H) 09/24/2019   BUN 17 09/24/2019   CREATININE 0.76 09/24/2019   CALCIUM 9.0 09/24/2019   PROT 6.9 09/24/2019   ALBUMIN 3.8 09/24/2019   AST 14 (L) 09/24/2019   ALT <5 09/24/2019   ALKPHOS 75 09/24/2019   BILITOT 0.3 09/24/2019   GFRNONAA >60 09/24/2019   GFRAA >60 09/24/2019    Lab Results  Component Value Date   WBC 2.6 (L) 09/24/2019   NEUTROABS 2.1 09/24/2019   HGB 11.4 (L) 09/24/2019   HCT 36.0 09/24/2019   MCV 96.8 09/24/2019   PLT 241 09/24/2019     STUDIES: No results found.  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  PLAN:    1. Squamous cell carcinoma of the upper lobe of left lung: Imaging results reviewed independently and it was agreed upon the patient's final staging is considered IIb.  MRI of the brain on July 22, 2019 was reported as negative. She will benefit from current chemotherapy using weekly carboplatinum and Taxol along with daily XRT.  Because she is only stage II, she does not qualify for maintenance durvalumab.  Proceed with her 8th and final cycle of weekly carboplatin and taxol today.  Patient will finish XRT tomorrow.  No further intervention is needed at this time.  Will order PDL-1 status if needed for future use.  Return to clinic in 2 months with repeat PET scan and furhter evaluation.  2.  Shortness of breath: Chronic and unchanged.  Continue oxygen as prescribed. 3.  Thrombocytopenia: Resolved. 4.   Leukopenia: White blood cell count is slightly decreased today. Proceed with treatment as above.  Patient expressed understanding and was in agreement with this plan. She also understands that She can call clinic at any time with any questions, concerns, or complaints.   Cancer Staging Squamous cell carcinoma lung, left ([place]) Staging form: Lung, AJCC 8th Edition - Clinical stage from 07/26/2019: Stage IIB (cT3, cN0, cM0) - Signed by Lloyd Huger, MD on 07/26/2019   Lloyd Huger, MD   09/24/2019 6:32 PM

## 2019-09-21 ENCOUNTER — Other Ambulatory Visit: Payer: Self-pay

## 2019-09-21 ENCOUNTER — Ambulatory Visit (INDEPENDENT_AMBULATORY_CARE_PROVIDER_SITE_OTHER): Payer: Medicare HMO | Admitting: Psychiatry

## 2019-09-21 ENCOUNTER — Encounter: Payer: Self-pay | Admitting: Psychiatry

## 2019-09-21 ENCOUNTER — Ambulatory Visit
Admission: RE | Admit: 2019-09-21 | Discharge: 2019-09-21 | Disposition: A | Discharge status: Home / Self Care | Payer: Medicare HMO | Source: Ambulatory Visit | Attending: Radiation Oncology | Admitting: Radiation Oncology

## 2019-09-21 DIAGNOSIS — F3342 Major depressive disorder, recurrent, in full remission: Secondary | ICD-10-CM | POA: Diagnosis not present

## 2019-09-21 DIAGNOSIS — Z510 Encounter for antineoplastic radiation therapy: Secondary | ICD-10-CM | POA: Diagnosis not present

## 2019-09-21 DIAGNOSIS — F411 Generalized anxiety disorder: Secondary | ICD-10-CM

## 2019-09-21 DIAGNOSIS — R0600 Dyspnea, unspecified: Secondary | ICD-10-CM | POA: Diagnosis not present

## 2019-09-21 DIAGNOSIS — C3492 Malignant neoplasm of unspecified part of left bronchus or lung: Secondary | ICD-10-CM | POA: Diagnosis not present

## 2019-09-21 DIAGNOSIS — J449 Chronic obstructive pulmonary disease, unspecified: Secondary | ICD-10-CM | POA: Diagnosis not present

## 2019-09-21 DIAGNOSIS — C3412 Malignant neoplasm of upper lobe, left bronchus or lung: Secondary | ICD-10-CM | POA: Diagnosis not present

## 2019-09-21 DIAGNOSIS — F17218 Nicotine dependence, cigarettes, with other nicotine-induced disorders: Secondary | ICD-10-CM | POA: Diagnosis not present

## 2019-09-21 DIAGNOSIS — Z9981 Dependence on supplemental oxygen: Secondary | ICD-10-CM | POA: Diagnosis not present

## 2019-09-21 MED ORDER — TEMAZEPAM 15 MG PO CAPS: 15.0000 mg | ORAL_CAPSULE | Freq: Every evening | ORAL | 0 refills | Status: DC | PRN

## 2019-09-21 MED ORDER — VENLAFAXINE HCL ER 150 MG PO CP24: 150.0000 mg | ORAL_CAPSULE | Freq: Every day | ORAL | 1 refills | Status: DC

## 2019-09-21 MED ORDER — GABAPENTIN 400 MG PO CAPS: 400.0000 mg | ORAL_CAPSULE | Freq: Two times a day (BID) | ORAL | 1 refills | Status: DC

## 2019-09-21 MED ORDER — GABAPENTIN 100 MG PO CAPS: ORAL_CAPSULE | ORAL | 1 refills | Status: DC

## 2019-09-21 NOTE — Progress Notes (Signed)
[facility] MD OP Progress Note  I connected with  Suzanne Glenn on 09/21/19 by a video enabled telemedicine application and verified that I am speaking with the correct person using two identifiers.   I discussed the limitations of evaluation and management by telemedicine. The patient expressed understanding and agreed to proceed.   09/21/2019 10:04 AM Suzanne Glenn  MRN:  [id number]  Chief Complaint:  " I am doing okay."  HPI: Patient reported that she is continuing to receive chemo and radiation therapy.  She informed that this is her last week of radiation and chemotherapy sessions.  She is doing well overall.  She feels her mood is stable.  She did report poor sleep and staying up most of the night watching TV.  She has tried trazodone 300 mg in the past but felt that it made did not work anymore.  She is also try over-the-counter melatonin which also did not seem to be helpful.  She was agreeable to try temazepam at bedtime on as-needed basis for sleep.   Visit Diagnosis:    ICD-10-CM   1. MDD (major depressive disorder), recurrent, in full remission ([place])  F33.42   2. GAD (generalized anxiety disorder)  F41.1     Past Psychiatric History: Depression , anxiety  Past Medical History:  Past Medical History:  Diagnosis Date  . Anxiety   . Asthma   . Cancer ([place]) 12/2018   w/u for right upper lobe mass/cancer  . COPD (chronic obstructive pulmonary disease) (H[facility])    also, emphysema. now using o2 via np 24 hours a day  . Coronary artery disease   . Depression   . Diabetes mellitus, type II ([place])   . GERD (gastroesophageal reflux disease)   . Headache    migraines in early 20's  . Heart murmur   . Hypertension   . Lung cancer ([place])   . Neuropathy   . Restless leg     Past Surgical History:  Procedure Laterality Date  . BACK SURGERY  2005   surgery x 2, disc fused in neck, pinched nerve in center of back and neck  . CARDIAC CATHETERIZATION  2015   1 stent placed for  blockage  . cervical bone infusion    . CHOLECYSTECTOMY    . THORACIC DISC SURGERY    . TUBAL LIGATION    . VIDEO BRONCHOSCOPY WITH ENDOBRONCHIAL ULTRASOUND N/A 03/06/2019   Procedure: VIDEO BRONCHOSCOPY WITH ENDOBRONCHIAL ULTRASOUND - DIABETIC;  Surgeon: Ottie Glazier, MD;  Location: [facility] ORS;  Service: Thoracic;  Laterality: N/A;    Family Psychiatric History: See below  Family History:  Family History  Problem Relation Age of Onset  . Anxiety disorder Mother   . Cancer - Lung Mother   . Depression Sister   . Cancer Sister   . Depression Brother   . Cancer Sister   . Cancer Sister   . Cancer Sister   . Heart Problems Sister   . Bipolar disorder Sister   . Diabetes Sister   . Cancer - Lung Sister   . Hypertension Sister   . Diabetes Sister   . Hyperlipidemia Sister   . COPD Sister   . Heart Problems Brother   . Cancer Brother   . Cancer Brother   . Cancer Brother   . Breast cancer Maternal Aunt     Social History:  Social History   Socioeconomic History  . Marital status: Married    Spouse name: Abe People  . Number  of children: Not on file  . Years of education: Not on file  . Highest education level: Not on file  Occupational History    Comment: disabled  Social Needs  . Financial resource strain: Not on file  . Food insecurity    Worry: Not on file    Inability: Not on file  . Transportation needs    Medical: Not on file    Non-medical: Not on file  Tobacco Use  . Smoking status: Current Every Day Smoker    Packs/day: 1.00    Years: 40.00    Pack years: 40.00    Types: Cigarettes    Start date: 05/05/1975  . Smokeless tobacco: Never Used  Substance and Sexual Activity  . Alcohol use: No    Alcohol/week: 0.0 standard drinks    Comment: socially  . Drug use: No  . Sexual activity: Not Currently  Lifestyle  . Physical activity    Days per week: Not on file    Minutes per session: Not on file  . Stress: Not on file  Relationships  . Social  Herbalist on phone: Not on file    Gets together: Not on file    Attends religious service: Not on file    Active member of club or organization: Not on file    Attends meetings of clubs or organizations: Not on file    Relationship status: Not on file  Other Topics Concern  . Not on file  Social History Narrative  . Not on file    Allergies:  Allergies  Allergen Reactions  . Diclofenac-Misoprostol Other (See Comments)    Arthrotec - gastritis   . Nsaids Other (See Comments)    gastritis  . Zolpidem Other (See Comments)    Sleep walking   . Hydrocodone-Acetaminophen Nausea And Vomiting  . Simvastatin Other (See Comments)    body aches    Metabolic Disorder Labs: Lab Results  Component Value Date   HGBA1C 6.4 (H) 01/02/2019   MPG 136.98 01/02/2019   No results found for: PROLACTIN No results found for: CHOL, TRIG, HDL, CHOLHDL, VLDL, LDLCALC No results found for: TSH  Therapeutic Level Labs: No results found for: LITHIUM No results found for: VALPROATE No components found for:  CBMZ  Current Medications: Current Outpatient Medications  Medication Sig Dispense Refill  . ACCU-CHEK FASTCLIX LANCETS MISC     . ACCU-CHEK SMARTVIEW test strip     . albuterol (PROVENTIL HFA) 108 (90 BASE) MCG/ACT inhaler Inhale 2 puffs into the lungs every 4 (four) hours as needed for wheezing or shortness of breath.     Marland Kitchen albuterol (PROVENTIL) (2.5 MG/3ML) 0.083% nebulizer solution Take 2.5 mg by nebulization every 6 (six) hours as needed for wheezing or shortness of breath.    Marland Kitchen amLODipine (NORVASC) 10 MG tablet Take 10 mg by mouth daily.     Marland Kitchen aspirin 81 MG chewable tablet Chew 1 tablet (81 mg total) by mouth daily. Continue holding now, as you are doing until procedure. (Patient taking differently: Chew 81 mg by mouth daily. ) 30 tablet 0  . carbidopa-levodopa (SINEMET IR) 25-100 MG tablet Take 1 tablet by mouth 3 (three) times daily.    . diclofenac sodium (VOLTAREN) 1 %  GEL     . esomeprazole (NEXIUM) 40 MG capsule Take 40 mg by mouth daily.    . ferrous sulfate 325 (65 FE) MG tablet Take 1 tablet (325 mg total) by mouth 2 (two) times  daily with a meal. 30 tablet 3  . fluticasone (FLONASE) 50 MCG/ACT nasal spray Place 2 sprays into both nostrils daily as needed for allergies.     . Fluticasone-Umeclidin-Vilant 100-62.5-25 MCG/INH AEPB Inhale 1 puff into the lungs daily. Treligy    . gabapentin (NEURONTIN) 100 MG capsule Take 1 capsule (100 mg total) by mouth 2 (two) times daily. QAM, QNOON (Patient taking differently: Take 200 mg by mouth daily. QAM) 180 capsule 2  . gabapentin (NEURONTIN) 400 MG capsule Take 1 capsule (400 mg total) by mouth 2 (two) times daily. 400 mg around 1300, and 2000 180 capsule 2  . ipratropium (ATROVENT) 0.02 % nebulizer solution Take 2.5 mLs (0.5 mg total) by nebulization every 6 (six) hours as needed for wheezing or shortness of breath. 75 mL 12  . metFORMIN (GLUCOPHAGE) 500 MG tablet Take 500 mg by mouth daily with breakfast.     . metoprolol tartrate (LOPRESSOR) 25 MG tablet Take 25 mg by mouth 2 (two) times daily.     . mirabegron ER (MYRBETRIQ) 25 MG TB24 tablet Take 1 tablet by mouth daily.    . Multiple Vitamin (MULTIVITAMIN) tablet Take 1 tablet by mouth daily.    . ondansetron (ZOFRAN) 8 MG tablet Take 1 tablet (8 mg total) by mouth 2 (two) times daily. 60 tablet 0  . OXYGEN Inhale 2 L into the lungs continuous.    . potassium chloride (KLOR-CON) 8 MEQ tablet Take 8 mEq by mouth daily.     . pravastatin (PRAVACHOL) 80 MG tablet Take 80 mg by mouth every evening.     . prochlorperazine (COMPAZINE) 10 MG tablet Take 1 tablet (10 mg total) by mouth every 6 (six) hours as needed (Nausea or vomiting). 60 tablet 2  . solifenacin (VESICARE) 10 MG tablet Take 10 mg by mouth daily.    Marland Kitchen spironolactone (ALDACTONE) 50 MG tablet Take 50 mg by mouth daily.     . sucralfate (CARAFATE) 1 g tablet Take 1 tablet (1 g total) by mouth 3 (three)  times daily. Dissolve in 3-4 tbsp warm water, swish and swallow 90 tablet 3  . venlafaxine XR (EFFEXOR-XR) 150 MG 24 hr capsule Take 1 capsule (150 mg total) by mouth daily with breakfast. 90 capsule 1   No current facility-administered medications for this visit.       Psychiatric Specialty Exam: ROS  There were no vitals taken for this visit.There is no height or weight on file to calculate BMI.  General Appearance: Unable to assess due to phone visit  Eye Contact: Unable to assess due to phone visit  Speech:  Clear and Coherent and Normal Rate  Volume:  Normal  Mood:  Euthymic  Affect:  Congruent  Thought Process:  Goal Directed, Linear and Descriptions of Associations: Intact  Orientation:  Full (Time, Place, and Person)  Thought Content: Logical   Suicidal Thoughts:  No  Homicidal Thoughts:  No  Memory:  Recent;   Good Remote;   Good  Judgement:  Good  Insight:  Good  Psychomotor Activity:  Normal  Concentration:  Concentration: Good and Attention Span: Good  Recall:  Good  Fund of Knowledge: Good  Language: Negative  Akathisia:  Negative  Handed:  Right  AIMS (if indicated): not done  Assets:  Communication Skills Desire for Improvement Financial Resources/Insurance Resilience Social Support  ADL's:  Intact  Cognition: WNL  Sleep:  Poor   Screenings: PHQ2-9     Patient Outreach Telephone from 02/28/2017 in  [place]  PHQ-2 Total Score  1       Assessment and Plan: Patient reported doing well on combination regimen of Effexor and gabapentin.  She reported poor sleep and was offered temazepam. Potential side effects of medication and risks vs benefits of treatment vs non-treatment were explained and discussed. All questions were answered.  1. MDD (major depressive disorder), recurrent, in full remission ([place])  - venlafaxine XR (EFFEXOR-XR) 150 MG 24 hr capsule; Take 1 capsule (150 mg total) by mouth daily with breakfast.  Dispense: 90 capsule;  Refill: 1 - gabapentin (NEURONTIN) 100 MG capsule; Take 2 capsules in the morning  Dispense: 180 capsule; Refill: 1 - gabapentin (NEURONTIN) 400 MG capsule; Take 1 capsule (400 mg total) by mouth 2 (two) times daily. Take 1 capsule at 1pm and take 1 capsule at 8pm  Dispense: 180 capsule; Refill: 1 - temazepam (RESTORIL) 15 MG capsule; Take 1 capsule (15 mg total) by mouth at bedtime as needed for sleep.  Dispense: 90 capsule; Refill: 0  2. GAD (generalized anxiety disorder)  - venlafaxine XR (EFFEXOR-XR) 150 MG 24 hr capsule; Take 1 capsule (150 mg total) by mouth daily with breakfast.  Dispense: 90 capsule; Refill: 1 - temazepam (RESTORIL) 15 MG capsule; Take 1 capsule (15 mg total) by mouth at bedtime as needed for sleep.  Dispense: 90 capsule; Refill: 0  Follow-up in 3 months.  Patient was encouraged to call sooner if needed.  [place] Crane, MD 09/21/2019, 10:04 AM

## 2019-09-22 ENCOUNTER — Other Ambulatory Visit: Payer: Self-pay

## 2019-09-22 ENCOUNTER — Ambulatory Visit
Admission: RE | Admit: 2019-09-22 | Discharge: 2019-09-22 | Disposition: A | Discharge status: Home / Self Care | Payer: Medicare HMO | Source: Ambulatory Visit | Attending: Radiation Oncology | Admitting: Radiation Oncology

## 2019-09-22 DIAGNOSIS — C3412 Malignant neoplasm of upper lobe, left bronchus or lung: Secondary | ICD-10-CM | POA: Diagnosis not present

## 2019-09-22 DIAGNOSIS — Z510 Encounter for antineoplastic radiation therapy: Secondary | ICD-10-CM | POA: Diagnosis not present

## 2019-09-22 DIAGNOSIS — F17218 Nicotine dependence, cigarettes, with other nicotine-induced disorders: Secondary | ICD-10-CM | POA: Diagnosis not present

## 2019-09-22 DIAGNOSIS — R0600 Dyspnea, unspecified: Secondary | ICD-10-CM | POA: Diagnosis not present

## 2019-09-22 DIAGNOSIS — J449 Chronic obstructive pulmonary disease, unspecified: Secondary | ICD-10-CM | POA: Diagnosis not present

## 2019-09-22 DIAGNOSIS — Z9981 Dependence on supplemental oxygen: Secondary | ICD-10-CM | POA: Diagnosis not present

## 2019-09-22 DIAGNOSIS — C3492 Malignant neoplasm of unspecified part of left bronchus or lung: Secondary | ICD-10-CM | POA: Diagnosis not present

## 2019-09-23 ENCOUNTER — Other Ambulatory Visit: Payer: Self-pay

## 2019-09-23 ENCOUNTER — Ambulatory Visit
Admission: RE | Admit: 2019-09-23 | Discharge: 2019-09-23 | Disposition: A | Discharge status: Home / Self Care | Payer: Medicare HMO | Source: Ambulatory Visit | Attending: Radiation Oncology | Admitting: Radiation Oncology

## 2019-09-23 DIAGNOSIS — Z510 Encounter for antineoplastic radiation therapy: Secondary | ICD-10-CM | POA: Diagnosis not present

## 2019-09-23 DIAGNOSIS — C3412 Malignant neoplasm of upper lobe, left bronchus or lung: Secondary | ICD-10-CM | POA: Diagnosis not present

## 2019-09-23 NOTE — Progress Notes (Signed)
Patient pre screened for office appointment, no questions or concerns today. Patient reminded of upcoming appointment time and date. 

## 2019-09-24 ENCOUNTER — Ambulatory Visit
Admission: RE | Admit: 2019-09-24 | Discharge: 2019-09-24 | Disposition: A | Discharge status: Home / Self Care | Payer: Medicare HMO | Source: Ambulatory Visit | Attending: Radiation Oncology | Admitting: Radiation Oncology

## 2019-09-24 ENCOUNTER — Inpatient Hospital Stay: Payer: Medicare HMO

## 2019-09-24 ENCOUNTER — Encounter: Payer: Self-pay | Admitting: Oncology

## 2019-09-24 ENCOUNTER — Inpatient Hospital Stay (HOSPITAL_BASED_OUTPATIENT_CLINIC_OR_DEPARTMENT_OTHER): Payer: Medicare HMO | Admitting: Oncology

## 2019-09-24 ENCOUNTER — Other Ambulatory Visit: Payer: Self-pay

## 2019-09-24 VITALS — BP 136/75 | HR 90 | Temp 97.9°F | Resp 18 | Wt 202.0 lb

## 2019-09-24 DIAGNOSIS — C3492 Malignant neoplasm of unspecified part of left bronchus or lung: Secondary | ICD-10-CM

## 2019-09-24 DIAGNOSIS — Z5111 Encounter for antineoplastic chemotherapy: Secondary | ICD-10-CM | POA: Diagnosis not present

## 2019-09-24 DIAGNOSIS — F17210 Nicotine dependence, cigarettes, uncomplicated: Secondary | ICD-10-CM | POA: Diagnosis not present

## 2019-09-24 DIAGNOSIS — Z510 Encounter for antineoplastic radiation therapy: Secondary | ICD-10-CM | POA: Diagnosis not present

## 2019-09-24 DIAGNOSIS — C3412 Malignant neoplasm of upper lobe, left bronchus or lung: Secondary | ICD-10-CM | POA: Diagnosis not present

## 2019-09-24 LAB — COMPREHENSIVE METABOLIC PANEL
ALT: <5 U/L (ref 0–44)
AST: 14 U/L — ABNORMAL LOW (ref 15–41)
Albumin: 3.8 g/dL (ref 3.5–5.0)
Alkaline Phosphatase: 75 U/L (ref 38–126)
Anion gap: 8 (ref 5–15)
BUN: 17 mg/dL (ref 8–23)
CO2: 29 mmol/L (ref 22–32)
Calcium: 9 mg/dL (ref 8.9–10.3)
Chloride: 99 mmol/L (ref 98–111)
Creatinine, Ser: 0.76 mg/dL (ref 0.44–1.00)
GFR calc Af Amer: >60 mL/min (ref >60)
GFR calc non Af Amer: >60 mL/min (ref >60)
Glucose, Bld: 127 mg/dL — ABNORMAL HIGH (ref 70–99)
Potassium: 4.2 mmol/L (ref 3.5–5.1)
Sodium: 136 mmol/L (ref 135–145)
Total Bilirubin: 0.3 mg/dL (ref 0.3–1.2)
Total Protein: 6.9 g/dL (ref 6.5–8.1)

## 2019-09-24 LAB — CBC WITH DIFFERENTIAL/PLATELET
Abs Immature Granulocytes: 0.02 10*3/uL (ref 0.00–0.07)
Basophils Absolute: 0 10*3/uL (ref 0.0–0.1)
Basophils Relative: 1 %
Eosinophils Absolute: 0 10*3/uL (ref 0.0–0.5)
Eosinophils Relative: 1 %
HCT: 36 % (ref 36.0–46.0)
Hemoglobin: 11.4 g/dL — ABNORMAL LOW (ref 12.0–15.0)
Immature Granulocytes: 1 %
Lymphocytes Relative: 8 %
Lymphs Abs: 0.2 10*3/uL — ABNORMAL LOW (ref 0.7–4.0)
MCH: 30.6 pg (ref 26.0–34.0)
MCHC: 31.7 g/dL (ref 30.0–36.0)
MCV: 96.8 fL (ref 80.0–100.0)
Monocytes Absolute: 0.3 10*3/uL (ref 0.1–1.0)
Monocytes Relative: 10 %
Neutro Abs: 2.1 10*3/uL (ref 1.7–7.7)
Neutrophils Relative %: 79 %
Platelets: 241 10*3/uL (ref 150–400)
RBC: 3.72 MIL/uL — ABNORMAL LOW (ref 3.87–5.11)
RDW: 15.8 % — ABNORMAL HIGH (ref 11.5–15.5)
WBC: 2.6 10*3/uL — ABNORMAL LOW (ref 4.0–10.5)
nRBC: 0 % (ref 0.0–0.2)

## 2019-09-24 MED ORDER — DEXAMETHASONE SODIUM PHOSPHATE 10 MG/ML IJ SOLN
10.0000 mg | Freq: Once | INTRAMUSCULAR | Status: AC
  Administered 2019-09-24: 10 mg via INTRAVENOUS
  Filled 2019-09-24: qty 1

## 2019-09-24 MED ORDER — FAMOTIDINE IN NACL 20-0.9 MG/50ML-% IV SOLN
20.0000 mg | Freq: Once | INTRAVENOUS | Status: AC
  Administered 2019-09-24: 20 mg via INTRAVENOUS
  Filled 2019-09-24: qty 50

## 2019-09-24 MED ORDER — SODIUM CHLORIDE 0.9 % IV SOLN
208.2000 mg | Freq: Once | INTRAVENOUS | Status: AC
  Administered 2019-09-24: 210 mg via INTRAVENOUS
  Filled 2019-09-24: qty 21

## 2019-09-24 MED ORDER — SODIUM CHLORIDE 0.9 % IV SOLN
Freq: Once | INTRAVENOUS | Status: AC
  Administered 2019-09-24: 10:00:00 via INTRAVENOUS
  Filled 2019-09-24: qty 250

## 2019-09-24 MED ORDER — PALONOSETRON HCL INJECTION 0.25 MG/5ML
0.2500 mg | Freq: Once | INTRAVENOUS | Status: AC
  Administered 2019-09-24: 0.25 mg via INTRAVENOUS
  Filled 2019-09-24: qty 5

## 2019-09-24 MED ORDER — DIPHENHYDRAMINE HCL 50 MG/ML IJ SOLN
12.5000 mg | Freq: Once | INTRAMUSCULAR | Status: AC
  Administered 2019-09-24: 12.5 mg via INTRAVENOUS
  Filled 2019-09-24: qty 1

## 2019-09-24 MED ORDER — SODIUM CHLORIDE 0.9 % IV SOLN
45.0000 mg/m2 | Freq: Once | INTRAVENOUS | Status: AC
  Administered 2019-09-24: 90 mg via INTRAVENOUS
  Filled 2019-09-24: qty 15

## 2019-09-24 NOTE — Progress Notes (Signed)
Patient is here today for a follow-up with no complaints.

## 2019-09-25 ENCOUNTER — Ambulatory Visit
Admission: RE | Admit: 2019-09-25 | Discharge: 2019-09-25 | Disposition: A | Discharge status: Home / Self Care | Payer: Medicare HMO | Source: Ambulatory Visit | Attending: Radiation Oncology | Admitting: Radiation Oncology

## 2019-09-25 ENCOUNTER — Other Ambulatory Visit: Payer: Self-pay

## 2019-09-25 DIAGNOSIS — C3412 Malignant neoplasm of upper lobe, left bronchus or lung: Secondary | ICD-10-CM | POA: Diagnosis not present

## 2019-09-25 DIAGNOSIS — Z510 Encounter for antineoplastic radiation therapy: Secondary | ICD-10-CM | POA: Diagnosis not present

## 2019-09-30 ENCOUNTER — Encounter: Payer: Self-pay | Admitting: Oncology

## 2019-10-29 ENCOUNTER — Encounter: Payer: Self-pay | Admitting: Radiation Oncology

## 2019-10-29 ENCOUNTER — Other Ambulatory Visit: Payer: Self-pay

## 2019-10-29 NOTE — Progress Notes (Signed)
Patient pre screened for office appointment, no questions or concerns today. Patient reminded of upcoming appointment time and date. 

## 2019-10-30 ENCOUNTER — Ambulatory Visit
Admission: RE | Admit: 2019-10-30 | Discharge: 2019-10-30 | Disposition: A | Discharge status: Home / Self Care | Payer: Medicare HMO | Source: Ambulatory Visit | Attending: Radiation Oncology | Admitting: Radiation Oncology

## 2019-11-10 ENCOUNTER — Encounter: Payer: Self-pay | Admitting: Radiation Oncology

## 2019-11-10 ENCOUNTER — Other Ambulatory Visit: Payer: Self-pay

## 2019-11-10 NOTE — Progress Notes (Signed)
Patient pre screened for office appointment, no questions or concerns today. Patient reminded of upcoming appointment time and date. 

## 2019-11-11 ENCOUNTER — Encounter: Payer: Self-pay | Admitting: Radiation Oncology

## 2019-11-11 ENCOUNTER — Ambulatory Visit
Admission: RE | Admit: 2019-11-11 | Discharge: 2019-11-11 | Disposition: A | Discharge status: Home / Self Care | Payer: Medicare HMO | Source: Ambulatory Visit | Attending: Radiation Oncology | Admitting: Radiation Oncology

## 2019-11-11 ENCOUNTER — Other Ambulatory Visit: Payer: Self-pay

## 2019-11-11 VITALS — BP 133/76 | HR 93 | Wt 199.7 lb

## 2019-11-11 DIAGNOSIS — Z923 Personal history of irradiation: Secondary | ICD-10-CM | POA: Diagnosis not present

## 2019-11-11 DIAGNOSIS — Z9221 Personal history of antineoplastic chemotherapy: Secondary | ICD-10-CM | POA: Insufficient documentation

## 2019-11-11 DIAGNOSIS — C3412 Malignant neoplasm of upper lobe, left bronchus or lung: Secondary | ICD-10-CM | POA: Insufficient documentation

## 2019-11-11 DIAGNOSIS — F17210 Nicotine dependence, cigarettes, uncomplicated: Secondary | ICD-10-CM | POA: Insufficient documentation

## 2019-11-11 DIAGNOSIS — R918 Other nonspecific abnormal finding of lung field: Secondary | ICD-10-CM

## 2019-11-11 NOTE — Progress Notes (Signed)
Radiation Oncology Follow up Note  Name: Suzanne Glenn   Date:   11/11/2019 MRN:  [id number] DOB: Sep 01, 1951    This 69 y.o. female presents to the clinic today for 1 month follow-up status post radiation therapy to her left upper lobe for squamous cell carcinoma.  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  She has a slight productive cough no hemoptysis no chest tightness or significant shortness of breath.  They have been doing testing for PD-L1 and that is pending she may be a candidate in the future for immunotherapy.  She has a PET scan scheduled for February 10.  COMPLICATIONS OF TREATMENT: none  FOLLOW UP COMPLIANCE: keeps appointments   PHYSICAL EXAM:  BP 133/76   Pulse 93   Wt 199 lb 11.2 oz (90.6 kg)   BMI 35.38 kg/m  Well-developed well-nourished patient in NAD. HEENT reveals PERLA, EOMI, discs not visualized.  Oral cavity is clear. No oral mucosal lesions are identified. Neck is clear without evidence of cervical or supraclavicular adenopathy. Lungs are clear to A&P. Cardiac examination is essentially unremarkable with regular rate and rhythm without murmur rub or thrill. Abdomen is benign with no organomegaly or masses noted. Motor sensory and DTR levels are equal and symmetric in the upper and lower extremities. Cranial nerves II through XII are grossly intact. Proprioception is intact. No peripheral adenopathy or edema is identified. No motor or sensory levels are noted. Crude visual fields are within normal range.  RADIOLOGY RESULTS: PET scan will be reviewed when available  PLAN: Present time patient is doing well no significant side effects from her concurrent chemoradiation.  And pleased with her overall progress.  Of asked to see her back in 3  to 4 months for follow-up.  We will review her PET CT scan when it becomes available.  She continues close follow-up care with medical oncology.  I would like to take this opportunity to thank you for allowing me to participate in the care of your patient.Noreene Filbert, MD

## 2019-11-20 NOTE — Progress Notes (Signed)
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  INTERVAL HISTORY: Patient returns to clinic today for further evaluation and discussion of her imaging results.  She currently feels well and is at her baseline. She has chronic shortness of breath and requires oxygen 24 hours a day.  She has no neurologic complaints.  She denies any recent fevers or illnesses.  She has a good appetite and denies weight loss.  She denies any pain.  She has no chest pain, cough, or hemoptysis.  She denies any nausea, vomiting, constipation, or diarrhea.  She has no urinary complaints.  Patient offers no further specific complaints today.  REVIEW OF SYSTEMS:   Review of Systems  Constitutional: Negative.  Negative for fever, malaise/fatigue and weight loss.  Respiratory: Positive for shortness of breath. Negative for cough and hemoptysis.   Cardiovascular: Negative.  Negative for chest pain and leg swelling.  Gastrointestinal: Negative.  Negative for abdominal pain.  Genitourinary: Negative.  Negative for dysuria.  Musculoskeletal: Negative.  Negative for back pain.  Skin: Negative.  Negative for rash.  Neurological: Negative.  Negative for dizziness, weakness and headaches.  Psychiatric/Behavioral: Negative.  The patient is not nervous/anxious.     As per HPI. Otherwise, a complete review of systems is negative.  PAST MEDICAL HISTORY: Past Medical History:  Diagnosis Date  . Anxiety   . Asthma   . Cancer ([place]) 12/2018   w/u for right upper lobe mass/cancer  . COPD (chronic obstructive pulmonary disease) (HCC)    also, emphysema. now using o2 via np 24 hours a day  .  Coronary artery disease   . Depression   . Diabetes mellitus, type II ([place])   . GERD (gastroesophageal reflux disease)   . Headache    migraines in early 20's  . Heart murmur   . Hypertension   . Lung cancer ([place])   . Neuropathy   . Restless leg     PAST SURGICAL HISTORY: Past Surgical History:  Procedure Laterality Date  . BACK SURGERY  2005   surgery x 2, disc fused in neck, pinched nerve in center of back and neck  . CARDIAC CATHETERIZATION  2015   1 stent placed for blockage  . cervical bone infusion    . CHOLECYSTECTOMY    . THORACIC DISC SURGERY    . TUBAL LIGATION    . VIDEO BRONCHOSCOPY WITH ENDOBRONCHIAL ULTRASOUND N/A 03/06/2019   Procedure: VIDEO BRONCHOSCOPY WITH ENDOBRONCHIAL ULTRASOUND - DIABETIC;  Surgeon: Ottie Glazier, MD;  Location: [facility] ORS;  Service: Thoracic;  Laterality: N/A;    FAMILY HISTORY: Family History  Problem Relation Age of Onset  . Anxiety disorder Mother   . Cancer - Lung Mother   . Depression Sister   . Cancer Sister   . Depression Brother   . Cancer Sister   . Cancer Sister   . Cancer Sister   . Heart Problems Sister   . Bipolar disorder Sister   . Diabetes Sister   . Cancer - Lung Sister   . Hypertension Sister   . Diabetes Sister   . Hyperlipidemia Sister   . COPD Sister   . Heart Problems Brother   . Cancer Brother   .  Cancer Brother   . Cancer Brother   . Breast cancer Maternal Aunt     ADVANCED DIRECTIVES (Y/N):  N  HEALTH MAINTENANCE: Social History   Tobacco Use  . Smoking status: Current Every Day Smoker    Packs/day: 1.00    Years: 40.00    Pack years: 40.00    Types: Cigarettes    Start date: 05/05/1975  . Smokeless tobacco: Never Used  Substance Use Topics  . Alcohol use: No    Alcohol/week: 0.0 standard drinks    Comment: socially  . Drug use: No     Colonoscopy:  PAP:  Bone density:  Lipid panel:  Allergies  Allergen Reactions  . Diclofenac-Misoprostol Other (See Comments)    Arthrotec -  gastritis   . Nsaids Other (See Comments)    gastritis  . Zolpidem Other (See Comments)    Sleep walking   . Hydrocodone-Acetaminophen Nausea And Vomiting  . Simvastatin Other (See Comments)    body aches    Current Outpatient Medications  Medication Sig Dispense Refill  . ACCU-CHEK FASTCLIX LANCETS MISC     . ACCU-CHEK SMARTVIEW test strip     . albuterol (PROVENTIL HFA) 108 (90 BASE) MCG/ACT inhaler Inhale 2 puffs into the lungs every 4 (four) hours as needed for wheezing or shortness of breath.     Marland Kitchen albuterol (PROVENTIL) (2.5 MG/3ML) 0.083% nebulizer solution Take 2.5 mg by nebulization every 6 (six) hours as needed for wheezing or shortness of breath.    Marland Kitchen amLODipine (NORVASC) 10 MG tablet Take 10 mg by mouth daily.     Marland Kitchen aspirin 81 MG chewable tablet Chew 1 tablet (81 mg total) by mouth daily. Continue holding now, as you are doing until procedure. (Patient taking differently: Chew 81 mg by mouth daily. ) 30 tablet 0  . carbidopa-levodopa (SINEMET IR) 25-100 MG tablet Take 1 tablet by mouth 3 (three) times daily.    . diclofenac sodium (VOLTAREN) 1 % GEL     . esomeprazole (NEXIUM) 40 MG capsule Take 40 mg by mouth daily.    . ferrous sulfate 325 (65 FE) MG tablet Take 1 tablet (325 mg total) by mouth 2 (two) times daily with a meal. 30 tablet 3  . fluticasone (FLONASE) 50 MCG/ACT nasal spray Place 2 sprays into both nostrils daily as needed for allergies.     . Fluticasone-Umeclidin-Vilant 100-62.5-25 MCG/INH AEPB Inhale 1 puff into the lungs daily. Treligy    . gabapentin (NEURONTIN) 100 MG capsule Take 1 capsule (100 mg total) by mouth 2 (two) times daily. QAM, QNOON (Patient taking differently: Take 200 mg by mouth daily. QAM) 180 capsule 2  . gabapentin (NEURONTIN) 100 MG capsule Take 2 capsules in the morning 180 capsule 1  . gabapentin (NEURONTIN) 400 MG capsule Take 1 capsule (400 mg total) by mouth 2 (two) times daily. Take 1 capsule at 1pm and take 1 capsule at 8pm 180  capsule 1  . ipratropium (ATROVENT) 0.02 % nebulizer solution Take 2.5 mLs (0.5 mg total) by nebulization every 6 (six) hours as needed for wheezing or shortness of breath. 75 mL 12  . metFORMIN (GLUCOPHAGE) 500 MG tablet Take 500 mg by mouth daily with breakfast.     . metoprolol tartrate (LOPRESSOR) 25 MG tablet Take 25 mg by mouth 2 (two) times daily.     . mirabegron ER (MYRBETRIQ) 25 MG TB24 tablet Take 1 tablet by mouth daily.    . Multiple Vitamin (MULTIVITAMIN) tablet Take 1  tablet by mouth daily.    . ondansetron (ZOFRAN) 8 MG tablet Take 1 tablet (8 mg total) by mouth 2 (two) times daily. 60 tablet 0  . OXYGEN Inhale 2 L into the lungs continuous.    . potassium chloride (KLOR-CON) 8 MEQ tablet Take 8 mEq by mouth daily.     . pravastatin (PRAVACHOL) 80 MG tablet Take 80 mg by mouth every evening.     . prochlorperazine (COMPAZINE) 10 MG tablet Take 1 tablet (10 mg total) by mouth every 6 (six) hours as needed (Nausea or vomiting). 60 tablet 2  . rOPINIRole (REQUIP) 1 MG tablet Take 1 mg by mouth at bedtime.    . solifenacin (VESICARE) 10 MG tablet Take 10 mg by mouth daily.    Marland Kitchen spironolactone (ALDACTONE) 50 MG tablet Take 50 mg by mouth daily.     . sucralfate (CARAFATE) 1 g tablet Take 1 tablet (1 g total) by mouth 3 (three) times daily. Dissolve in 3-4 tbsp warm water, swish and swallow 90 tablet 3  . temazepam (RESTORIL) 15 MG capsule Take 1 capsule (15 mg total) by mouth at bedtime as needed for sleep. 90 capsule 0  . venlafaxine XR (EFFEXOR-XR) 150 MG 24 hr capsule Take 1 capsule (150 mg total) by mouth daily with breakfast. 90 capsule 1   No current facility-administered medications for this visit.    OBJECTIVE: There were no vitals filed for this visit.   There is no height or weight on file to calculate BMI.    ECOG FS:0 - Asymptomatic  General: Well-developed, well-nourished, no acute distress. Eyes: Pink conjunctiva, anicteric sclera. HEENT: Normocephalic, moist mucous  membranes. Lungs: No audible wheezing or coughing. Heart: Regular rate and rhythm. Abdomen: Soft, nontender, no obvious distention. Musculoskeletal: No edema, cyanosis, or clubbing. Neuro: Alert, answering all questions appropriately. Cranial nerves grossly intact. Skin: No rashes or petechiae noted. Psych: Normal affect.  LAB RESULTS:  Lab Results  Component Value Date   NA 137 11/25/2019   K 4.1 11/25/2019   CL 98 11/25/2019   CO2 31 11/25/2019   GLUCOSE 133 (H) 11/25/2019   BUN 16 11/25/2019   CREATININE 0.75 11/25/2019   CALCIUM 9.4 11/25/2019   PROT 7.4 11/25/2019   ALBUMIN 4.1 11/25/2019   AST 14 (L) 11/25/2019   ALT 5 11/25/2019   ALKPHOS 76 11/25/2019   BILITOT 0.4 11/25/2019   GFRNONAA >60 11/25/2019   GFRAA >60 11/25/2019    Lab Results  Component Value Date   WBC 8.4 11/25/2019   NEUTROABS 7.0 11/25/2019   HGB 12.3 11/25/2019   HCT 38.7 11/25/2019   MCV 98.0 11/25/2019   PLT 234 11/25/2019     STUDIES: NM PET Image Restag (PS) Skull Base To Thigh  Result Date: 11/25/2019 CLINICAL DATA:  Subsequent treatment strategy for stage II B left upper lobe squamous cell lung cancer completed concurrent chemotherapy and radiation therapy. EXAM: NUCLEAR MEDICINE PET SKULL BASE TO THIGH TECHNIQUE: 11.1 mCi F-18 FDG was injected intravenously. Full-ring PET imaging was performed from the skull base to thigh after the radiotracer. CT data was obtained and used for attenuation correction and anatomic localization. Fasting blood glucose: 108 mg/dl COMPARISON:  07/07/2019 PET-CT.  04/09/2019 chest CT. FINDINGS: Mediastinal blood pool activity: SUV max 3.2 Liver activity: SUV max NA NECK: No enlarged or hypermetabolic lymph nodes in the neck. Incidental CT findings: none CHEST: No enlarged or hypermetabolic axillary, mediastinal or hilar lymph nodes. Bilobed hypermetabolic 5.9 x 3.0 cm lingular  lung mass with max SUV 6.9, previously 7.1 x 5.6 cm with max SUV 22.8, decreased in  size and metabolism, with new cavitation in the medial portion of the mass. No new hypermetabolic pulmonary findings. Incidental CT findings: Three-vessel coronary atherosclerosis. Atherosclerotic nonaneurysmal thoracic aorta. No new significant pulmonary nodules. Mild centrilobular emphysema. ABDOMEN/PELVIS: No abnormal hypermetabolic activity within the liver, pancreas, adrenal glands, or spleen. No hypermetabolic lymph nodes in the abdomen or pelvis. Nonspecific hypermetabolism at the anorectal junction with max SUV 6.9, previous max SUV 7.7, not significantly changed, without discrete CT correlate. Incidental CT findings: Cholecystectomy. Atherosclerotic nonaneurysmal abdominal aorta. Bilateral tubal ligation clips in place. SKELETON: No focal hypermetabolic activity to suggest skeletal metastasis. Incidental CT findings: Surgical hardware from ACD[facility]. IMPRESSION: 1. Partial response to therapy. Hypermetabolic bilobed cavitary lingular lung mass is decreased in size and metabolism, as detailed. 2. No hypermetabolic thoracic adenopathy or distant metastatic disease. 3. Nonspecific persistent anorectal junction hypermetabolism without CT correlate, unchanged. While most commonly physiologic, anorectal neoplasm cannot be strictly excluded. Suggest directed clinical correlation. 4. Aortic Atherosclerosis (ICD10-I70.0) and Emphysema (ICD10-J43.9). Electronically Signed   By: Ilona Sorrel M.D.   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  PLAN:    1. Squamous cell carcinoma of the upper lobe of left lung: Imaging results reviewed independently and it was agreed upon the patient's final staging is considered IIb.  MRI of the brain on July 22, 2019 was reported as negative.  Patient completed concurrent chemotherapy with weekly carboplatinum and Taxol along with daily XRT on approximately September 24 2019.  Repeat imaging with PET scan on November 25, 2019 reviewed independently and reported as above with significant improvement of disease.  No intervention is needed at this time, but given her stage and risk of recurrence of ordered PD-L1 testing.  Return to clinic in 3 months with repeat imaging using CT scan and further evaluation.   2.  Shortness of breath: Chronic and unchanged.  Continue oxygen as prescribed. 3.  Thrombocytopenia: Resolved. 4.  Leukopenia: Resolved.  I spent a total of 30 minutes reviewing chart data, face-to-face evaluation with the patient, counseling and coordination of care as detailed above.   Patient expressed understanding and was in agreement with this plan. She also understands that She can call clinic at any time with any questions, concerns, or complaints.   Cancer Staging Squamous cell carcinoma of left lung [facility]) Staging form: Lung, AJCC 8th Edition - Clinical stage from 07/26/2019: Stage IIB (cT3, cN0, cM0) - Signed by Lloyd Huger, MD on 07/26/2019   Lloyd Huger, MD   11/28/2019 9:04 AM

## 2019-11-25 ENCOUNTER — Encounter
Admission: RE | Admit: 2019-11-25 | Discharge: 2019-11-25 | Disposition: A | Discharge status: Home / Self Care | Payer: Medicare HMO | Source: Ambulatory Visit | Attending: Oncology | Admitting: Oncology

## 2019-11-25 ENCOUNTER — Other Ambulatory Visit: Payer: Self-pay

## 2019-11-25 ENCOUNTER — Inpatient Hospital Stay: Payer: Medicare HMO | Attending: Oncology

## 2019-11-25 DIAGNOSIS — J439 Emphysema, unspecified: Secondary | ICD-10-CM | POA: Diagnosis not present

## 2019-11-25 DIAGNOSIS — C3492 Malignant neoplasm of unspecified part of left bronchus or lung: Secondary | ICD-10-CM

## 2019-11-25 DIAGNOSIS — R0602 Shortness of breath: Secondary | ICD-10-CM | POA: Diagnosis not present

## 2019-11-25 DIAGNOSIS — Z9981 Dependence on supplemental oxygen: Secondary | ICD-10-CM | POA: Diagnosis not present

## 2019-11-25 DIAGNOSIS — Z5111 Encounter for antineoplastic chemotherapy: Secondary | ICD-10-CM | POA: Diagnosis not present

## 2019-11-25 DIAGNOSIS — I700 Atherosclerosis of aorta: Secondary | ICD-10-CM | POA: Diagnosis not present

## 2019-11-25 DIAGNOSIS — F17210 Nicotine dependence, cigarettes, uncomplicated: Secondary | ICD-10-CM | POA: Diagnosis not present

## 2019-11-25 DIAGNOSIS — I2510 Atherosclerotic heart disease of native coronary artery without angina pectoris: Secondary | ICD-10-CM | POA: Insufficient documentation

## 2019-11-25 DIAGNOSIS — C3412 Malignant neoplasm of upper lobe, left bronchus or lung: Secondary | ICD-10-CM | POA: Insufficient documentation

## 2019-11-25 LAB — CBC WITH DIFFERENTIAL/PLATELET
Abs Immature Granulocytes: 0.05 10*3/uL (ref 0.00–0.07)
Basophils Absolute: 0 10*3/uL (ref 0.0–0.1)
Basophils Relative: 0 %
Eosinophils Absolute: 0.1 10*3/uL (ref 0.0–0.5)
Eosinophils Relative: 1 %
HCT: 38.7 % (ref 36.0–46.0)
Hemoglobin: 12.3 g/dL (ref 12.0–15.0)
Immature Granulocytes: 1 %
Lymphocytes Relative: 9 %
Lymphs Abs: 0.7 10*3/uL (ref 0.7–4.0)
MCH: 31.1 pg (ref 26.0–34.0)
MCHC: 31.8 g/dL (ref 30.0–36.0)
MCV: 98 fL (ref 80.0–100.0)
Monocytes Absolute: 0.6 10*3/uL (ref 0.1–1.0)
Monocytes Relative: 7 %
Neutro Abs: 7 10*3/uL (ref 1.7–7.7)
Neutrophils Relative %: 82 %
Platelets: 234 10*3/uL (ref 150–400)
RBC: 3.95 MIL/uL (ref 3.87–5.11)
RDW: 15.9 % — ABNORMAL HIGH (ref 11.5–15.5)
WBC: 8.4 10*3/uL (ref 4.0–10.5)
nRBC: 0 % (ref 0.0–0.2)

## 2019-11-25 LAB — COMPREHENSIVE METABOLIC PANEL
ALT: 5 U/L (ref 0–44)
AST: 14 U/L — ABNORMAL LOW (ref 15–41)
Albumin: 4.1 g/dL (ref 3.5–5.0)
Alkaline Phosphatase: 76 U/L (ref 38–126)
Anion gap: 8 (ref 5–15)
BUN: 16 mg/dL (ref 8–23)
CO2: 31 mmol/L (ref 22–32)
Calcium: 9.4 mg/dL (ref 8.9–10.3)
Chloride: 98 mmol/L (ref 98–111)
Creatinine, Ser: 0.75 mg/dL (ref 0.44–1.00)
GFR calc Af Amer: >60 mL/min (ref >60)
GFR calc non Af Amer: >60 mL/min (ref >60)
Glucose, Bld: 133 mg/dL — ABNORMAL HIGH (ref 70–99)
Potassium: 4.1 mmol/L (ref 3.5–5.1)
Sodium: 137 mmol/L (ref 135–145)
Total Bilirubin: 0.4 mg/dL (ref 0.3–1.2)
Total Protein: 7.4 g/dL (ref 6.5–8.1)

## 2019-11-25 LAB — GLUCOSE, CAPILLARY: Glucose-Capillary: 108 mg/dL — ABNORMAL HIGH (ref 70–99)

## 2019-11-25 MED ORDER — FLUDEOXYGLUCOSE F - 18 (FDG) INJECTION
11.1100 | Freq: Once | INTRAVENOUS | Status: AC | PRN
  Administered 2019-11-25: 11.11 via INTRAVENOUS

## 2019-11-26 ENCOUNTER — Encounter: Payer: Self-pay | Admitting: Oncology

## 2019-11-26 ENCOUNTER — Other Ambulatory Visit: Payer: Self-pay

## 2019-11-26 DIAGNOSIS — D649 Anemia, unspecified: Secondary | ICD-10-CM | POA: Diagnosis not present

## 2019-11-26 DIAGNOSIS — I10 Essential (primary) hypertension: Secondary | ICD-10-CM | POA: Diagnosis not present

## 2019-11-26 DIAGNOSIS — I2510 Atherosclerotic heart disease of native coronary artery without angina pectoris: Secondary | ICD-10-CM | POA: Diagnosis not present

## 2019-11-26 DIAGNOSIS — E538 Deficiency of other specified B group vitamins: Secondary | ICD-10-CM | POA: Diagnosis not present

## 2019-11-26 DIAGNOSIS — C3492 Malignant neoplasm of unspecified part of left bronchus or lung: Secondary | ICD-10-CM | POA: Diagnosis not present

## 2019-11-26 DIAGNOSIS — J9611 Chronic respiratory failure with hypoxia: Secondary | ICD-10-CM | POA: Diagnosis not present

## 2019-11-26 DIAGNOSIS — F3341 Major depressive disorder, recurrent, in partial remission: Secondary | ICD-10-CM | POA: Diagnosis not present

## 2019-11-26 DIAGNOSIS — G2581 Restless legs syndrome: Secondary | ICD-10-CM | POA: Diagnosis not present

## 2019-11-26 DIAGNOSIS — E1165 Type 2 diabetes mellitus with hyperglycemia: Secondary | ICD-10-CM | POA: Diagnosis not present

## 2019-11-26 NOTE — Progress Notes (Signed)
Patient prescreened for appointment. Patient has no concerns or questions.  

## 2019-11-27 ENCOUNTER — Inpatient Hospital Stay (HOSPITAL_BASED_OUTPATIENT_CLINIC_OR_DEPARTMENT_OTHER): Payer: Medicare HMO | Admitting: Oncology

## 2019-11-27 ENCOUNTER — Other Ambulatory Visit: Payer: Self-pay

## 2019-11-27 DIAGNOSIS — F17210 Nicotine dependence, cigarettes, uncomplicated: Secondary | ICD-10-CM | POA: Diagnosis not present

## 2019-11-27 DIAGNOSIS — C3492 Malignant neoplasm of unspecified part of left bronchus or lung: Secondary | ICD-10-CM

## 2019-11-27 DIAGNOSIS — R0602 Shortness of breath: Secondary | ICD-10-CM | POA: Diagnosis not present

## 2019-11-27 DIAGNOSIS — C3412 Malignant neoplasm of upper lobe, left bronchus or lung: Secondary | ICD-10-CM | POA: Diagnosis not present

## 2019-11-27 DIAGNOSIS — Z9981 Dependence on supplemental oxygen: Secondary | ICD-10-CM | POA: Diagnosis not present

## 2019-12-11 ENCOUNTER — Encounter: Payer: Self-pay | Admitting: Psychiatry

## 2019-12-11 ENCOUNTER — Other Ambulatory Visit: Payer: Self-pay

## 2019-12-11 ENCOUNTER — Ambulatory Visit (INDEPENDENT_AMBULATORY_CARE_PROVIDER_SITE_OTHER): Payer: Medicare HMO | Admitting: Psychiatry

## 2019-12-11 DIAGNOSIS — F411 Generalized anxiety disorder: Secondary | ICD-10-CM

## 2019-12-11 DIAGNOSIS — F3342 Major depressive disorder, recurrent, in full remission: Secondary | ICD-10-CM | POA: Insufficient documentation

## 2019-12-11 MED ORDER — GABAPENTIN 100 MG PO CAPS: ORAL_CAPSULE | ORAL | 1 refills | Status: DC

## 2019-12-11 MED ORDER — GABAPENTIN 400 MG PO CAPS: 400.0000 mg | ORAL_CAPSULE | Freq: Two times a day (BID) | ORAL | 1 refills | Status: DC

## 2019-12-11 MED ORDER — TEMAZEPAM 15 MG PO CAPS: 15.0000 mg | ORAL_CAPSULE | Freq: Every evening | ORAL | 0 refills | Status: DC | PRN

## 2019-12-11 MED ORDER — VENLAFAXINE HCL ER 150 MG PO CP24: 150.0000 mg | ORAL_CAPSULE | Freq: Every day | ORAL | 1 refills | Status: DC

## 2019-12-11 NOTE — Progress Notes (Signed)
[facility] Progress Note  I connected with  Suzanne Glenn on 12/11/19 by phone and verified that I am speaking with the correct person using two identifiers.   I discussed the limitations of evaluation and management by phone. The patient expressed understanding and agreed to proceed.   12/11/2019 10:47 AM Suzanne Glenn  MRN:  [id number]  Chief Complaint:  " I am doing okay."  HPI: Patient reported that she is doing well.  She informed that she completed her cancer treatment for squamous cell carcinoma of lung.  She feels fine physically and mentally.  She has been able to sleep well for the most part.  Temazepam helps her with sleep.  On some occasions she may wake up early.  She believes she should continue the same combination of medicines for now.   Visit Diagnosis:    ICD-10-CM   1. MDD (major depressive disorder), recurrent, in full remission ([place])  F33.42   2. GAD (generalized anxiety disorder)  F41.1     Past Psychiatric History: Depression , anxiety  Past Medical History:  Past Medical History:  Diagnosis Date  . Anxiety   . Asthma   . Cancer ([place]) 12/2018   w/u for right upper lobe mass/cancer  . COPD (chronic obstructive pulmonary disease) (HCC)    also, emphysema. now using o2 via np 24 hours a day  . Coronary artery disease   . Depression   . Diabetes mellitus, type II ([place])   . GERD (gastroesophageal reflux disease)   . Headache    migraines in early 20's  . Heart murmur   . Hypertension   . Lung cancer ([place])   . Neuropathy   . Restless leg     Past Surgical History:  Procedure Laterality Date  . BACK SURGERY  2005   surgery x 2, disc fused in neck, pinched nerve in center of back and neck  . CARDIAC CATHETERIZATION  2015   1 stent placed for blockage  . cervical bone infusion    . CHOLECYSTECTOMY    . THORACIC DISC SURGERY    . TUBAL LIGATION    . VIDEO BRONCHOSCOPY WITH ENDOBRONCHIAL ULTRASOUND N/A 03/06/2019   Procedure: VIDEO BRONCHOSCOPY  WITH ENDOBRONCHIAL ULTRASOUND - DIABETIC;  Surgeon: Ottie Glazier, MD;  Location: [facility] ORS;  Service: Thoracic;  Laterality: N/A;    Family Psychiatric History: See below  Family History:  Family History  Problem Relation Age of Onset  . Anxiety disorder Mother   . Cancer - Lung Mother   . Depression Sister   . Cancer Sister   . Depression Brother   . Cancer Sister   . Cancer Sister   . Cancer Sister   . Heart Problems Sister   . Bipolar disorder Sister   . Diabetes Sister   . Cancer - Lung Sister   . Hypertension Sister   . Diabetes Sister   . Hyperlipidemia Sister   . COPD Sister   . Heart Problems Brother   . Cancer Brother   . Cancer Brother   . Cancer Brother   . Breast cancer Maternal Aunt     Social History:  Social History   Socioeconomic History  . Marital status: Married    Spouse name: Abe People  . Number of children: Not on file  . Years of education: Not on file  . Highest education level: Not on file  Occupational History    Comment: disabled  Tobacco Use  . Smoking status: Current  Every Day Smoker    Packs/day: 1.00    Years: 40.00    Pack years: 40.00    Types: Cigarettes    Start date: 05/05/1975  . Smokeless tobacco: Never Used  Substance and Sexual Activity  . Alcohol use: No    Alcohol/week: 0.0 standard drinks    Comment: socially  . Drug use: No  . Sexual activity: Not Currently  Other Topics Concern  . Not on file  Social History Narrative  . Not on file   Social Determinants of Health   Financial Resource Strain:   . Difficulty of Paying Living Expenses: Not on file  Food Insecurity:   . Worried About Charity fundraiser in the Last Year: Not on file  . Ran Out of Food in the Last Year: Not on file  Transportation Needs:   . Lack of Transportation (Medical): Not on file  . Lack of Transportation (Non-Medical): Not on file  Physical Activity:   . Days of Exercise per Week: Not on file  . Minutes of Exercise per Session: Not  on file  Stress:   . Feeling of Stress : Not on file  Social Connections:   . Frequency of Communication with Friends and Family: Not on file  . Frequency of Social Gatherings with Friends and Family: Not on file  . Attends Religious Services: Not on file  . Active Member of Clubs or Organizations: Not on file  . Attends Archivist Meetings: Not on file  . Marital Status: Not on file    Allergies:  Allergies  Allergen Reactions  . Diclofenac-Misoprostol Other (See Comments)    Arthrotec - gastritis   . Nsaids Other (See Comments)    gastritis  . Zolpidem Other (See Comments)    Sleep walking   . Hydrocodone-Acetaminophen Nausea And Vomiting  . Simvastatin Other (See Comments)    body aches    Metabolic Disorder Labs: Lab Results  Component Value Date   HGBA1C 6.4 (H) 01/02/2019   MPG 136.98 01/02/2019   No results found for: PROLACTIN No results found for: CHOL, TRIG, HDL, CHOLHDL, VLDL, LDLCALC No results found for: TSH  Therapeutic Level Labs: No results found for: LITHIUM No results found for: VALPROATE No components found for:  CBMZ  Current Medications: Current Outpatient Medications  Medication Sig Dispense Refill  . ACCU-CHEK FASTCLIX LANCETS MISC     . ACCU-CHEK SMARTVIEW test strip     . albuterol (PROVENTIL HFA) 108 (90 BASE) MCG/ACT inhaler Inhale 2 puffs into the lungs every 4 (four) hours as needed for wheezing or shortness of breath.     Marland Kitchen albuterol (PROVENTIL) (2.5 MG/3ML) 0.083% nebulizer solution Take 2.5 mg by nebulization every 6 (six) hours as needed for wheezing or shortness of breath.    Marland Kitchen amLODipine (NORVASC) 10 MG tablet Take 10 mg by mouth daily.     Marland Kitchen aspirin 81 MG chewable tablet Chew 1 tablet (81 mg total) by mouth daily. Continue holding now, as you are doing until procedure. (Patient taking differently: Chew 81 mg by mouth daily. ) 30 tablet 0  . carbidopa-levodopa (SINEMET IR) 25-100 MG tablet Take 1 tablet by mouth 3 (three)  times daily.    . diclofenac sodium (VOLTAREN) 1 % GEL     . esomeprazole (NEXIUM) 40 MG capsule Take 40 mg by mouth daily.    . ferrous sulfate 325 (65 FE) MG tablet Take 1 tablet (325 mg total) by mouth 2 (two) times daily  with a meal. 30 tablet 3  . fluticasone (FLONASE) 50 MCG/ACT nasal spray Place 2 sprays into both nostrils daily as needed for allergies.     . Fluticasone-Umeclidin-Vilant 100-62.5-25 MCG/INH AEPB Inhale 1 puff into the lungs daily. Treligy    . gabapentin (NEURONTIN) 100 MG capsule Take 1 capsule (100 mg total) by mouth 2 (two) times daily. QAM, QNOON (Patient taking differently: Take 200 mg by mouth daily. QAM) 180 capsule 2  . gabapentin (NEURONTIN) 100 MG capsule Take 2 capsules in the morning 180 capsule 1  . gabapentin (NEURONTIN) 400 MG capsule Take 1 capsule (400 mg total) by mouth 2 (two) times daily. Take 1 capsule at 1pm and take 1 capsule at 8pm 180 capsule 1  . ipratropium (ATROVENT) 0.02 % nebulizer solution Take 2.5 mLs (0.5 mg total) by nebulization every 6 (six) hours as needed for wheezing or shortness of breath. 75 mL 12  . metFORMIN (GLUCOPHAGE) 500 MG tablet Take 500 mg by mouth daily with breakfast.     . metoprolol tartrate (LOPRESSOR) 25 MG tablet Take 25 mg by mouth 2 (two) times daily.     . mirabegron ER (MYRBETRIQ) 25 MG TB24 tablet Take 1 tablet by mouth daily.    . Multiple Vitamin (MULTIVITAMIN) tablet Take 1 tablet by mouth daily.    . ondansetron (ZOFRAN) 8 MG tablet Take 1 tablet (8 mg total) by mouth 2 (two) times daily. 60 tablet 0  . OXYGEN Inhale 2 L into the lungs continuous.    . potassium chloride (KLOR-CON) 8 MEQ tablet Take 8 mEq by mouth daily.     . pravastatin (PRAVACHOL) 80 MG tablet Take 80 mg by mouth every evening.     . prochlorperazine (COMPAZINE) 10 MG tablet Take 1 tablet (10 mg total) by mouth every 6 (six) hours as needed (Nausea or vomiting). 60 tablet 2  . rOPINIRole (REQUIP) 1 MG tablet Take 1 mg by mouth at bedtime.     . solifenacin (VESICARE) 10 MG tablet Take 10 mg by mouth daily.    Marland Kitchen spironolactone (ALDACTONE) 50 MG tablet Take 50 mg by mouth daily.     . sucralfate (CARAFATE) 1 g tablet Take 1 tablet (1 g total) by mouth 3 (three) times daily. Dissolve in 3-4 tbsp warm water, swish and swallow 90 tablet 3  . temazepam (RESTORIL) 15 MG capsule Take 1 capsule (15 mg total) by mouth at bedtime as needed for sleep. 90 capsule 0  . venlafaxine XR (EFFEXOR-XR) 150 MG 24 hr capsule Take 1 capsule (150 mg total) by mouth daily with breakfast. 90 capsule 1   No current facility-administered medications for this visit.      Psychiatric Specialty Exam: ROS  There were no vitals taken for this visit.There is no height or weight on file to calculate BMI.  General Appearance: Unable to assess due to phone visit  Eye Contact: Unable to assess due to phone visit  Speech:  Clear and Coherent and Normal Rate  Volume:  Normal  Mood:  Euthymic  Affect:  Congruent  Thought Process:  Goal Directed, Linear and Descriptions of Associations: Intact  Orientation:  Full (Time, Place, and Person)  Thought Content: Logical   Suicidal Thoughts:  No  Homicidal Thoughts:  No  Memory:  Recent;   Good Remote;   Good  Judgement:  Good  Insight:  Good  Psychomotor Activity:  Normal  Concentration:  Concentration: Good and Attention Span: Good  Recall:  Good  Fund of Knowledge: Good  Language: Negative  Akathisia:  Negative  Handed:  Right  AIMS (if indicated): not done  Assets:  Communication Skills Desire for Improvement Financial Resources/Insurance Resilience Social Support  ADL's:  Intact  Cognition: WNL  Sleep:  Fair   Screenings: PHQ2-9     Patient Outreach Telephone from 02/28/2017 in [place]  PHQ-2 Total Score  1       Assessment and Plan: Patient reported doing well on combination regimen of medications. She denied any acute issues or concerns.   1. MDD (major depressive  disorder), recurrent, in full remission ([place])  - venlafaxine XR (EFFEXOR-XR) 150 MG 24 hr capsule; Take 1 capsule (150 mg total) by mouth daily with breakfast.  Dispense: 90 capsule; Refill: 1 - gabapentin (NEURONTIN) 100 MG capsule; Take 2 capsules in the morning  Dispense: 180 capsule; Refill: 1 - gabapentin (NEURONTIN) 400 MG capsule; Take 1 capsule (400 mg total) by mouth 2 (two) times daily. Take 1 capsule at 1pm and take 1 capsule at 8pm  Dispense: 180 capsule; Refill: 1 - temazepam (RESTORIL) 15 MG capsule; Take 1 capsule (15 mg total) by mouth at bedtime as needed for sleep.  Dispense: 90 capsule; Refill: 0  2. GAD (generalized anxiety disorder)  - venlafaxine XR (EFFEXOR-XR) 150 MG 24 hr capsule; Take 1 capsule (150 mg total) by mouth daily with breakfast.  Dispense: 90 capsule; Refill: 1 - temazepam (RESTORIL) 15 MG capsule; Take 1 capsule (15 mg total) by mouth at bedtime as needed for sleep.  Dispense: 90 capsule; Refill: 0  Follow-up in 3 months.   Patient was encouraged to call sooner if needed.  [place] Crane, MD 12/11/2019, 10:47 AM

## 2019-12-11 NOTE — Addendum Note (Signed)
Addended by: [place] Crane on: 12/11/2019 11:36 AM   Modules accepted: Orders

## 2019-12-16 ENCOUNTER — Telehealth: Payer: Self-pay

## 2019-12-16 DIAGNOSIS — C3492 Malignant neoplasm of unspecified part of left bronchus or lung: Secondary | ICD-10-CM

## 2019-12-16 NOTE — Telephone Encounter (Signed)
Survivorship Care Plan visit completed.  Treatment summary reviewed and mailed to patient.  ASCO answers booklet reviewed and mailed to patient.  CARE program and Cancer Transitions discussed with patient along with other resources cancer center offers to patients and caregivers.  Patient verbalized understanding.  Pt in agreement for APP to call her on the telephone on March 18th between 11:00 and 12:00 to introduce her to the Survivorship Clinic.  Packet mailed.

## 2019-12-31 ENCOUNTER — Inpatient Hospital Stay: Payer: Medicare HMO | Attending: Nurse Practitioner | Admitting: Nurse Practitioner

## 2020-01-26 DIAGNOSIS — E663 Overweight: Secondary | ICD-10-CM | POA: Diagnosis not present

## 2020-01-26 DIAGNOSIS — C3412 Malignant neoplasm of upper lobe, left bronchus or lung: Secondary | ICD-10-CM | POA: Diagnosis not present

## 2020-01-26 DIAGNOSIS — J9611 Chronic respiratory failure with hypoxia: Secondary | ICD-10-CM | POA: Diagnosis not present

## 2020-01-26 DIAGNOSIS — F17218 Nicotine dependence, cigarettes, with other nicotine-induced disorders: Secondary | ICD-10-CM | POA: Diagnosis not present

## 2020-01-26 DIAGNOSIS — J439 Emphysema, unspecified: Secondary | ICD-10-CM | POA: Diagnosis not present

## 2020-01-26 DIAGNOSIS — R0600 Dyspnea, unspecified: Secondary | ICD-10-CM | POA: Diagnosis not present

## 2020-01-26 DIAGNOSIS — I700 Atherosclerosis of aorta: Secondary | ICD-10-CM | POA: Diagnosis not present

## 2020-02-09 ENCOUNTER — Other Ambulatory Visit: Payer: Self-pay

## 2020-02-10 ENCOUNTER — Encounter: Payer: Self-pay | Admitting: Radiation Oncology

## 2020-02-10 ENCOUNTER — Ambulatory Visit
Admission: RE | Admit: 2020-02-10 | Discharge: 2020-02-10 | Disposition: A | Discharge status: Home / Self Care | Payer: Medicare HMO | Source: Ambulatory Visit | Attending: Radiation Oncology | Admitting: Radiation Oncology

## 2020-02-10 DIAGNOSIS — Z923 Personal history of irradiation: Secondary | ICD-10-CM | POA: Insufficient documentation

## 2020-02-10 DIAGNOSIS — F17210 Nicotine dependence, cigarettes, uncomplicated: Secondary | ICD-10-CM | POA: Diagnosis not present

## 2020-02-10 DIAGNOSIS — C3412 Malignant neoplasm of upper lobe, left bronchus or lung: Secondary | ICD-10-CM | POA: Insufficient documentation

## 2020-02-10 DIAGNOSIS — C3492 Malignant neoplasm of unspecified part of left bronchus or lung: Secondary | ICD-10-CM

## 2020-02-10 NOTE — Progress Notes (Signed)
Radiation Oncology Follow up Note  Name: Suzanne Glenn   Date:   02/10/2020 MRN:  [id number] DOB: Jun 26, 1951    This 69 y.o. female presents to the clinic today for 47-month follow-up status post radiation therapy to her left upper lobe for a stage IIb squamous cell carcinoma.  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  Seen today in routine follow-up she is doing well she is on nasal oxygen.  She has a mild cough which is clear.  No hemoptysis.  She had a PET CT scan back in February.  Showing partial response to therapy with decreased hypermetabolic activity and decreased size of the lesion.  No thoracic adenopathy or distant metastatic disease was noted.  She is scheduled for a another CT scan in May.  COMPLICATIONS OF TREATMENT: none  FOLLOW UP COMPLIANCE: keeps appointments   PHYSICAL EXAM:  BP (!) (P) 148/70 (BP Location: Left Arm, Patient Position: Sitting)   Pulse (P) 88   Temp (!) (P) 97.3 F (36.3 C) (Tympanic)   Resp (P) 20   Wt (P) 205 lb 3.2 oz (93.1 kg)   BMI (P) 36.35 kg/m  Well-developed female in NAD on nasal oxygen.  Well-developed well-nourished patient in NAD. HEENT reveals PERLA, EOMI, discs not visualized.  Oral cavity is clear. No oral mucosal lesions are identified. Neck is clear without evidence of cervical or supraclavicular adenopathy. Lungs are clear to A&P. Cardiac examination is essentially unremarkable with regular rate and rhythm without murmur rub or thrill. Abdomen is benign with no organomegaly or masses noted. Motor sensory and DTR levels are equal and symmetric in the upper and lower extremities. Cranial nerves II through XII are grossly intact. Proprioception is intact. No peripheral adenopathy or edema is identified. No motor or sensory levels are noted. Crude visual fields are within normal  range.  RADIOLOGY RESULTS: PET CT scan reviewed we will review CT scan when it becomes available  PLAN: Present time patient is doing well with good response to therapy by CT and PET CT criteria.  I will review her CT scan in May and have asked her to forward me a copy of that report.  Otherwise I am pleased with her overall progress.  I have asked to see her back in 6 months for follow-up with a CT scan prior to that visit.  She continues close follow-up care with medical oncology.  Patient knows to call with any concerns.  I would like to take this opportunity to thank you for allowing me to participate in the care of your patient.Noreene Filbert, MD

## 2020-02-24 ENCOUNTER — Ambulatory Visit
Admission: RE | Admit: 2020-02-24 | Discharge: 2020-02-24 | Disposition: A | Discharge status: Home / Self Care | Payer: Medicare HMO | Source: Ambulatory Visit | Attending: Oncology | Admitting: Oncology

## 2020-02-24 ENCOUNTER — Other Ambulatory Visit: Payer: Self-pay

## 2020-02-24 DIAGNOSIS — C3492 Malignant neoplasm of unspecified part of left bronchus or lung: Secondary | ICD-10-CM

## 2020-02-24 DIAGNOSIS — C3490 Malignant neoplasm of unspecified part of unspecified bronchus or lung: Secondary | ICD-10-CM | POA: Diagnosis not present

## 2020-02-24 LAB — POCT I-STAT CREATININE: Creatinine, Ser: 0.8 mg/dL (ref 0.44–1.00)

## 2020-02-24 MED ORDER — IOHEXOL 300 MG/ML  SOLN
75.0000 mL | Freq: Once | INTRAMUSCULAR | Status: AC | PRN
  Administered 2020-02-24: 75 mL via INTRAVENOUS

## 2020-02-26 ENCOUNTER — Other Ambulatory Visit: Payer: Self-pay

## 2020-02-26 ENCOUNTER — Encounter: Payer: Self-pay | Admitting: Oncology

## 2020-02-26 NOTE — Progress Notes (Signed)
Patient prescreened for appointment. Patient has no concerns or questions.  

## 2020-02-28 NOTE — Progress Notes (Signed)
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  INTERVAL HISTORY: Patient returns to clinic today for further evaluation and discussion of her imaging results.  She continues to feel well and remains at her baseline.  She has chronic shortness of breath requiring oxygen 24 hours a day. She has no neurologic complaints.  She denies any recent fevers or illnesses.  She has a good appetite and denies weight loss.  She denies any pain.  She has no chest pain, cough, or hemoptysis.  She denies any nausea, vomiting, constipation, or diarrhea.  She has no urinary complaints.  Patient offers no further specific complaints today.  REVIEW OF SYSTEMS:   Review of Systems  Constitutional: Negative.  Negative for fever, malaise/fatigue and weight loss.  Respiratory: Positive for shortness of breath. Negative for cough and hemoptysis.   Cardiovascular: Negative.  Negative for chest pain and leg swelling.  Gastrointestinal: Negative.  Negative for abdominal pain.  Genitourinary: Negative.  Negative for dysuria.  Musculoskeletal: Negative.  Negative for back pain.  Skin: Negative.  Negative for rash.  Neurological: Negative.  Negative for dizziness, weakness and headaches.  Psychiatric/Behavioral: Negative.  The patient is not nervous/anxious.     As per HPI. Otherwise, a complete review of systems is negative.  PAST MEDICAL HISTORY: Past Medical History:  Diagnosis Date  . Anxiety   . Asthma   . Cancer ([place]) 12/2018   w/u for right upper lobe mass/cancer  . COPD (chronic obstructive pulmonary disease)  ([facility])    also, emphysema. now using o2 via np 24 hours a day  . Coronary artery disease   . Depression   . Diabetes mellitus, type II ([place])   . GERD (gastroesophageal reflux disease)   . Headache    migraines in early 20's  . Heart murmur   . Hypertension   . Lung cancer ([place])   . Neuropathy   . Restless leg     PAST SURGICAL HISTORY: Past Surgical History:  Procedure Laterality Date  . BACK SURGERY  2005   surgery x 2, disc fused in neck, pinched nerve in center of back and neck  . CARDIAC CATHETERIZATION  2015   1 stent placed for blockage  . cervical bone infusion    . CHOLECYSTECTOMY    . THORACIC DISC SURGERY    . TUBAL LIGATION    . VIDEO BRONCHOSCOPY WITH ENDOBRONCHIAL ULTRASOUND N/A 03/06/2019   Procedure: VIDEO BRONCHOSCOPY WITH ENDOBRONCHIAL ULTRASOUND - DIABETIC;  Surgeon: Ottie Glazier, MD;  Location: [facility] ORS;  Service: Thoracic;  Laterality: N/A;    FAMILY HISTORY: Family History  Problem Relation Age of Onset  . Anxiety disorder Mother   . Cancer - Lung Mother   . Depression Sister   . Cancer Sister   . Depression Brother   . Cancer Sister   . Cancer Sister   . Cancer Sister   . Heart Problems Sister   . Bipolar disorder Sister   . Diabetes Sister   . Cancer - Lung Sister   . Hypertension Sister   . Diabetes Sister   . Hyperlipidemia Sister   . COPD Sister   . Heart  Problems Brother   . Cancer Brother   . Cancer Brother   . Cancer Brother   . Breast cancer Maternal Aunt     ADVANCED DIRECTIVES (Y/N):  N  HEALTH MAINTENANCE: Social History   Tobacco Use  . Smoking status: Current Every Day Smoker    Packs/day: 1.00    Years: 40.00    Pack years: 40.00    Types: Cigarettes    Start date: 05/05/1975  . Smokeless tobacco: Never Used  Substance Use Topics  . Alcohol use: No    Alcohol/week: 0.0 standard drinks    Comment: socially  . Drug use: No     Colonoscopy:  PAP:  Bone density:  Lipid panel:  Allergies  Allergen  Reactions  . Diclofenac-Misoprostol Other (See Comments)    Arthrotec - gastritis   . Nsaids Other (See Comments)    gastritis  . Zolpidem Other (See Comments)    Sleep walking   . Hydrocodone-Acetaminophen Nausea And Vomiting  . Simvastatin Other (See Comments)    body aches    Current Outpatient Medications  Medication Sig Dispense Refill  . ACCU-CHEK FASTCLIX LANCETS MISC     . ACCU-CHEK SMARTVIEW test strip     . albuterol (PROVENTIL HFA) 108 (90 BASE) MCG/ACT inhaler Inhale 2 puffs into the lungs every 4 (four) hours as needed for wheezing or shortness of breath.     Marland Kitchen albuterol (PROVENTIL) (2.5 MG/3ML) 0.083% nebulizer solution Take 2.5 mg by nebulization every 6 (six) hours as needed for wheezing or shortness of breath.    Marland Kitchen amLODipine (NORVASC) 10 MG tablet Take 10 mg by mouth daily.     Marland Kitchen aspirin 81 MG chewable tablet Chew 1 tablet (81 mg total) by mouth daily. Continue holding now, as you are doing until procedure. (Patient taking differently: Chew 81 mg by mouth daily. ) 30 tablet 0  . carbidopa-levodopa (SINEMET IR) 25-100 MG tablet Take 1 tablet by mouth 3 (three) times daily.    . diclofenac sodium (VOLTAREN) 1 % GEL     . esomeprazole (NEXIUM) 40 MG capsule Take 40 mg by mouth daily.    . ferrous sulfate 325 (65 FE) MG tablet Take 1 tablet (325 mg total) by mouth 2 (two) times daily with a meal. 30 tablet 3  . fluticasone (FLONASE) 50 MCG/ACT nasal spray Place 2 sprays into both nostrils daily as needed for allergies.     . Fluticasone-Umeclidin-Vilant 100-62.5-25 MCG/INH AEPB Inhale 1 puff into the lungs daily. Treligy    . gabapentin (NEURONTIN) 100 MG capsule Take 1 capsule (100 mg total) by mouth 2 (two) times daily. QAM, QNOON (Patient taking differently: Take 200 mg by mouth daily. QAM) 180 capsule 2  . gabapentin (NEURONTIN) 100 MG capsule Take 2 capsules in the morning 180 capsule 1  . gabapentin (NEURONTIN) 400 MG capsule Take 1 capsule (400 mg total) by mouth 2  (two) times daily. Take 1 capsule at 1pm and take 1 capsule at 8pm 180 capsule 1  . ipratropium (ATROVENT) 0.02 % nebulizer solution Take 2.5 mLs (0.5 mg total) by nebulization every 6 (six) hours as needed for wheezing or shortness of breath. 75 mL 12  . metFORMIN (GLUCOPHAGE) 500 MG tablet Take 500 mg by mouth daily with breakfast.     . metoprolol tartrate (LOPRESSOR) 25 MG tablet Take 25 mg by mouth 2 (two) times daily.     . mirabegron ER (MYRBETRIQ) 25 MG TB24 tablet Take 1 tablet by mouth daily.    Marland Kitchen  Multiple Vitamin (MULTIVITAMIN) tablet Take 1 tablet by mouth daily.    . ondansetron (ZOFRAN) 8 MG tablet Take 1 tablet (8 mg total) by mouth 2 (two) times daily. 60 tablet 0  . OXYGEN Inhale 2 L into the lungs continuous.    . potassium chloride (KLOR-CON) 8 MEQ tablet Take 8 mEq by mouth daily.     . pravastatin (PRAVACHOL) 80 MG tablet Take 80 mg by mouth every evening.     . prochlorperazine (COMPAZINE) 10 MG tablet Take 1 tablet (10 mg total) by mouth every 6 (six) hours as needed (Nausea or vomiting). 60 tablet 2  . rOPINIRole (REQUIP) 1 MG tablet Take 1 mg by mouth at bedtime.    . solifenacin (VESICARE) 10 MG tablet Take 10 mg by mouth daily.    Marland Kitchen spironolactone (ALDACTONE) 50 MG tablet Take 50 mg by mouth daily.     . sucralfate (CARAFATE) 1 g tablet Take 1 tablet (1 g total) by mouth 3 (three) times daily. Dissolve in 3-4 tbsp warm water, swish and swallow 90 tablet 3  . temazepam (RESTORIL) 15 MG capsule Take 1 capsule (15 mg total) by mouth at bedtime as needed for sleep. 90 capsule 0  . venlafaxine XR (EFFEXOR-XR) 150 MG 24 hr capsule Take 1 capsule (150 mg total) by mouth daily with breakfast. 90 capsule 1   No current facility-administered medications for this visit.    OBJECTIVE: Vitals:   02/29/20 1421  BP: 138/63  Pulse: 86  Resp: (!) 22  Temp: (!) 97.2 F (36.2 C)  SpO2: 96%     Body mass index is 36.49 kg/m.    ECOG FS:0 - Asymptomatic  General:  Well-developed, well-nourished, no acute distress. Eyes: Pink conjunctiva, anicteric sclera. HEENT: Normocephalic, moist mucous membranes. Lungs: No audible wheezing or coughing. Heart: Regular rate and rhythm. Abdomen: Soft, nontender, no obvious distention. Musculoskeletal: No edema, cyanosis, or clubbing. Neuro: Alert, answering all questions appropriately. Cranial nerves grossly intact. Skin: No rashes or petechiae noted. Psych: Normal affect.  LAB RESULTS:  Lab Results  Component Value Date   NA 137 11/25/2019   K 4.1 11/25/2019   CL 98 11/25/2019   CO2 31 11/25/2019   GLUCOSE 133 (H) 11/25/2019   BUN 16 11/25/2019   CREATININE 0.80 02/24/2020   CALCIUM 9.4 11/25/2019   PROT 7.4 11/25/2019   ALBUMIN 4.1 11/25/2019   AST 14 (L) 11/25/2019   ALT 5 11/25/2019   ALKPHOS 76 11/25/2019   BILITOT 0.4 11/25/2019   GFRNONAA >60 11/25/2019   GFRAA >60 11/25/2019    Lab Results  Component Value Date   WBC 8.4 11/25/2019   NEUTROABS 7.0 11/25/2019   HGB 12.3 11/25/2019   HCT 38.7 11/25/2019   MCV 98.0 11/25/2019   PLT 234 11/25/2019     STUDIES: CT CHEST W CONTRAST  Result Date: 02/24/2020 CLINICAL DATA:  Squamous cell carcinoma of the lung lung EXAM: CT CHEST WITH CONTRAST TECHNIQUE: Multidetector CT imaging of the chest was performed during intravenous contrast administration. CONTRAST:  17m OMNIPAQUE IOHEXOL 300 MG/ML  SOLN COMPARISON:  CT 04/09/2019 FINDINGS: Cardiovascular: Coronary artery calcification and aortic atherosclerotic calcification. Mediastinum/Nodes: No axillary supraclavicular adenopathy no mediastinal hilar no pericardial esophagus normal. Lungs/Pleura: 5 mm RIGHT upper lobe nodule (image 44/3) is new from comparison exam. Nodule in the RIGHT middle lobe (image 70/3) is also new from prior. Additionally there 2 smaller nodules measuring approximately 2 mm each on image 66/3 on each side of the fissure  in the RIGHT lung. Marked decrease in the lingular mass  previously measuring 5.2 cm. There is now adjacent irregular nodules measuring 2.0 by and 1.6 cm (image 71/3). There is atelectasis in the lingula. In comparison to more recent PET-CT (11/25/2019), the posterior lesion is slightly decreased in size from 2.7 cm. The more anterior lesion which previously was cavitary is now solid as similar size. The small nodules in the RIGHT lung are not identified on comparison PET-CT scan although there is differing technique. Upper Abdomen: Limited view of the liver, kidneys, pancreas are unremarkable. Normal adrenal glands. Musculoskeletal: No aggressive osseous lesion. IMPRESSION: 1. Marked reduction in size of lingular mass with now only 2 adjacent nodules remaining. These nodules were hypermetabolic on the recent PET-CT scan. Lesions slightly contracted more recent PET-CT scan. 2. Several small nodules in the RIGHT lung are not placed on comparison PET-CT scan or CT scan. Recommend short-term interval follow-up to evaluate for growth of new pulmonary metastasis. Electronically Signed   By: Suzy Bouchard M.D.   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  PD-L1 85%.  PLAN:    1. Squamous cell carcinoma of the upper lobe of left lung: Imaging results reviewed independently and it was agreed upon the patient's final staging is considered IIb.  MRI of the brain on July 22, 2019 was reported as negative.  Patient completed concurrent chemotherapy with weekly carboplatinum and Taxol along with daily XRT on approximately September 24 2019.  CT scan results from Feb 24, 2020 reviewed independently and report as above with no obvious evidence of recurrent or progressive disease.  Several small lesions in the right lung warrant continued monitoring.  No intervention is needed at this time.  Return to clinic in 6 months with repeat imaging and further evaluation.  2.  Shortness of breath: Chronic and unchanged.   Continue oxygen as prescribed.  I spent a total of 20 minutes reviewing chart data, face-to-face evaluation with the patient, counseling and coordination of care as detailed above.   Patient expressed understanding and was in agreement with this plan. She also understands that She can call clinic at any time with any questions, concerns, or complaints.   Cancer Staging Squamous cell carcinoma of left lung [facility]) Staging form: Lung, AJCC 8th Edition - Clinical stage from 07/26/2019: Stage IIB (cT3, cN0, cM0) - Signed by Lloyd Huger, MD on 07/26/2019   Lloyd Huger, MD   02/29/2020 4:47 PM

## 2020-02-29 ENCOUNTER — Other Ambulatory Visit: Payer: Self-pay

## 2020-02-29 ENCOUNTER — Inpatient Hospital Stay: Payer: Medicare HMO | Attending: Oncology | Admitting: Oncology

## 2020-02-29 VITALS — BP 138/63 | HR 86 | Temp 97.2°F | Resp 22 | Wt 206.0 lb

## 2020-02-29 DIAGNOSIS — C3412 Malignant neoplasm of upper lobe, left bronchus or lung: Secondary | ICD-10-CM | POA: Diagnosis not present

## 2020-02-29 DIAGNOSIS — R0602 Shortness of breath: Secondary | ICD-10-CM | POA: Diagnosis not present

## 2020-02-29 DIAGNOSIS — Z9981 Dependence on supplemental oxygen: Secondary | ICD-10-CM | POA: Diagnosis not present

## 2020-02-29 DIAGNOSIS — C3492 Malignant neoplasm of unspecified part of left bronchus or lung: Secondary | ICD-10-CM | POA: Diagnosis not present

## 2020-02-29 DIAGNOSIS — F17210 Nicotine dependence, cigarettes, uncomplicated: Secondary | ICD-10-CM | POA: Insufficient documentation

## 2020-03-03 ENCOUNTER — Encounter: Payer: Self-pay | Admitting: Psychiatry

## 2020-03-03 ENCOUNTER — Other Ambulatory Visit: Payer: Self-pay

## 2020-03-03 ENCOUNTER — Telehealth (INDEPENDENT_AMBULATORY_CARE_PROVIDER_SITE_OTHER): Payer: Medicare HMO | Admitting: Psychiatry

## 2020-03-03 DIAGNOSIS — F3342 Major depressive disorder, recurrent, in full remission: Secondary | ICD-10-CM

## 2020-03-03 DIAGNOSIS — F411 Generalized anxiety disorder: Secondary | ICD-10-CM

## 2020-03-03 DIAGNOSIS — F17210 Nicotine dependence, cigarettes, uncomplicated: Secondary | ICD-10-CM | POA: Diagnosis not present

## 2020-03-03 MED ORDER — VENLAFAXINE HCL ER 150 MG PO CP24: 150.0000 mg | ORAL_CAPSULE | Freq: Every day | ORAL | 1 refills | Status: DC

## 2020-03-03 MED ORDER — GABAPENTIN 100 MG PO CAPS: ORAL_CAPSULE | ORAL | 1 refills | Status: DC

## 2020-03-03 MED ORDER — GABAPENTIN 400 MG PO CAPS: 400.0000 mg | ORAL_CAPSULE | Freq: Two times a day (BID) | ORAL | 1 refills | Status: DC

## 2020-03-03 MED ORDER — TEMAZEPAM 30 MG PO CAPS: 30.0000 mg | ORAL_CAPSULE | Freq: Every evening | ORAL | 1 refills | Status: DC | PRN
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
## 2020-03-16 DIAGNOSIS — J449 Chronic obstructive pulmonary disease, unspecified: Secondary | ICD-10-CM | POA: Diagnosis not present

## 2020-03-16 DIAGNOSIS — C3492 Malignant neoplasm of unspecified part of left bronchus or lung: Secondary | ICD-10-CM | POA: Diagnosis not present

## 2020-03-16 DIAGNOSIS — F3341 Major depressive disorder, recurrent, in partial remission: Secondary | ICD-10-CM | POA: Diagnosis not present

## 2020-03-16 DIAGNOSIS — G2581 Restless legs syndrome: Secondary | ICD-10-CM | POA: Diagnosis not present

## 2020-03-16 DIAGNOSIS — E538 Deficiency of other specified B group vitamins: Secondary | ICD-10-CM | POA: Diagnosis not present

## 2020-03-16 DIAGNOSIS — F331 Major depressive disorder, recurrent, moderate: Secondary | ICD-10-CM | POA: Diagnosis not present

## 2020-03-16 DIAGNOSIS — E1165 Type 2 diabetes mellitus with hyperglycemia: Secondary | ICD-10-CM | POA: Diagnosis not present

## 2020-03-16 DIAGNOSIS — Z0000 Encounter for general adult medical examination without abnormal findings: Secondary | ICD-10-CM | POA: Diagnosis not present

## 2020-03-16 DIAGNOSIS — J9611 Chronic respiratory failure with hypoxia: Secondary | ICD-10-CM | POA: Diagnosis not present

## 2020-03-16 DIAGNOSIS — I10 Essential (primary) hypertension: Secondary | ICD-10-CM | POA: Diagnosis not present

## 2020-03-16 DIAGNOSIS — I2510 Atherosclerotic heart disease of native coronary artery without angina pectoris: Secondary | ICD-10-CM | POA: Diagnosis not present

## 2020-03-30 DIAGNOSIS — E119 Type 2 diabetes mellitus without complications: Secondary | ICD-10-CM | POA: Diagnosis not present

## 2020-05-03 ENCOUNTER — Telehealth (HOSPITAL_COMMUNITY): Payer: Self-pay | Admitting: *Deleted

## 2020-05-03 DIAGNOSIS — F3342 Major depressive disorder, recurrent, in full remission: Secondary | ICD-10-CM

## 2020-05-03 MED ORDER — TEMAZEPAM 30 MG PO CAPS: 30.0000 mg | ORAL_CAPSULE | Freq: Every evening | ORAL | 1 refills | Status: DC | PRN

## 2020-05-03 NOTE — Telephone Encounter (Signed)
Done

## 2020-05-03 NOTE — Telephone Encounter (Signed)
Call from patient requesting a RX for her Restoril. She should be out of it and her next appt is on 7/27 with Dr Toy Care. She use [facility] [phone].

## 2020-05-03 NOTE — Addendum Note (Signed)
Addended by: [place] Crane on: 05/03/2020 02:19 PM   Modules accepted: Orders

## 2020-05-10 ENCOUNTER — Encounter (HOSPITAL_COMMUNITY): Payer: Self-pay | Admitting: Psychiatry

## 2020-05-10 ENCOUNTER — Other Ambulatory Visit: Payer: Self-pay

## 2020-05-10 ENCOUNTER — Telehealth (INDEPENDENT_AMBULATORY_CARE_PROVIDER_SITE_OTHER): Payer: Medicare HMO | Admitting: Psychiatry

## 2020-05-10 ENCOUNTER — Telehealth: Payer: Medicare HMO | Admitting: Psychiatry

## 2020-05-10 DIAGNOSIS — F411 Generalized anxiety disorder: Secondary | ICD-10-CM

## 2020-05-10 DIAGNOSIS — F3342 Major depressive disorder, recurrent, in full remission: Secondary | ICD-10-CM

## 2020-05-10 MED ORDER — VENLAFAXINE HCL ER 150 MG PO CP24: 150.0000 mg | ORAL_CAPSULE | Freq: Every day | ORAL | 1 refills | Status: DC

## 2020-05-10 MED ORDER — GABAPENTIN 400 MG PO CAPS: 400.0000 mg | ORAL_CAPSULE | Freq: Two times a day (BID) | ORAL | 1 refills | Status: DC

## 2020-05-10 MED ORDER — GABAPENTIN 100 MG PO CAPS: ORAL_CAPSULE | ORAL | 1 refills | Status: DC

## 2020-05-10 MED ORDER — DOXEPIN HCL 25 MG PO CAPS: ORAL_CAPSULE | ORAL | 0 refills | Status: DC

## 2020-05-10 NOTE — Progress Notes (Signed)
[facility] MD OP Progress Note  Virtual Visit via Telephone Note  I connected with Suzanne Glenn on 05/10/20 at 10:00 AM EDT by telephone and verified that I am speaking with the correct person using two identifiers.  Location: Patient: home Provider: Clinic   I discussed the limitations, risks, security and privacy concerns of performing an evaluation and management service by telephone and the availability of in person appointments. I also discussed with the patient that there may be a patient responsible charge related to this service. The patient expressed understanding and agreed to proceed.   I provided 13 minutes of non-face-to-face time during this encounter.   05/10/2020 10:02 AM Suzanne Glenn  MRN:  [id number]  Chief Complaint:  " I am still not sleeping well."   HPI: Patient reported that her mood has been stable. She still has difficulty in staying asleep throughout the night.  She reported that she wakes up frequently during the night and does not feel well rested. She did not find the increase in the dose of temazepam at the time of her last visit to be too helpful.  She was agreeable to try different medication.  She was offered doxepin and was explained that she can start with 1 capsule at bedtime and then can go up to 2 capsules if 1 capsule is not effective.  Visit Diagnosis:    ICD-10-CM   1. MDD (major depressive disorder), recurrent, in full remission ([place])  F33.42   2. GAD (generalized anxiety disorder)  F41.1     Past Psychiatric History: Depression , anxiety  Past Medical History:  Past Medical History:  Diagnosis Date  . Anxiety   . Asthma   . Cancer ([place]) 12/2018   w/u for right upper lobe mass/cancer  . COPD (chronic obstructive pulmonary disease) (HCC)    also, emphysema. now using o2 via np 24 hours a day  . Coronary artery disease   . Depression   . Diabetes mellitus, type II ([place])   . GERD (gastroesophageal reflux disease)   . Headache     migraines in early 20's  . Heart murmur   . Hypertension   . Lung cancer ([place])   . Neuropathy   . Restless leg     Past Surgical History:  Procedure Laterality Date  . BACK SURGERY  2005   surgery x 2, disc fused in neck, pinched nerve in center of back and neck  . CARDIAC CATHETERIZATION  2015   1 stent placed for blockage  . cervical bone infusion    . CHOLECYSTECTOMY    . THORACIC DISC SURGERY    . TUBAL LIGATION    . VIDEO BRONCHOSCOPY WITH ENDOBRONCHIAL ULTRASOUND N/A 03/06/2019   Procedure: VIDEO BRONCHOSCOPY WITH ENDOBRONCHIAL ULTRASOUND - DIABETIC;  Surgeon: Ottie Glazier, MD;  Location: [facility] ORS;  Service: Thoracic;  Laterality: N/A;    Family Psychiatric History: See below  Family History:  Family History  Problem Relation Age of Onset  . Anxiety disorder Mother   . Cancer - Lung Mother   . Depression Sister   . Cancer Sister   . Depression Brother   . Cancer Sister   . Cancer Sister   . Cancer Sister   . Heart Problems Sister   . Bipolar disorder Sister   . Diabetes Sister   . Cancer - Lung Sister   . Hypertension Sister   . Diabetes Sister   . Hyperlipidemia Sister   . COPD Sister   .  Heart Problems Brother   . Cancer Brother   . Cancer Brother   . Cancer Brother   . Breast cancer Maternal Aunt     Social History:  Social History   Socioeconomic History  . Marital status: Married    Spouse name: Abe People  . Number of children: Not on file  . Years of education: Not on file  . Highest education level: Not on file  Occupational History    Comment: disabled  Tobacco Use  . Smoking status: Current Every Day Smoker    Packs/day: 1.00    Years: 40.00    Pack years: 40.00    Types: Cigarettes    Start date: 05/05/1975  . Smokeless tobacco: Never Used  Vaping Use  . Vaping Use: Never used  Substance and Sexual Activity  . Alcohol use: No    Alcohol/week: 0.0 standard drinks    Comment: socially  . Drug use: No  . Sexual activity: Not  Currently  Other Topics Concern  . Not on file  Social History Narrative  . Not on file   Social Determinants of Health   Financial Resource Strain:   . Difficulty of Paying Living Expenses:   Food Insecurity:   . Worried About Charity fundraiser in the Last Year:   . Arboriculturist in the Last Year:   Transportation Needs:   . Film/video editor (Medical):   Marland Kitchen Lack of Transportation (Non-Medical):   Physical Activity:   . Days of Exercise per Week:   . Minutes of Exercise per Session:   Stress:   . Feeling of Stress :   Social Connections:   . Frequency of Communication with Friends and Family:   . Frequency of Social Gatherings with Friends and Family:   . Attends Religious Services:   . Active Member of Clubs or Organizations:   . Attends Archivist Meetings:   Marland Kitchen Marital Status:     Allergies:  Allergies  Allergen Reactions  . Diclofenac-Misoprostol Other (See Comments)    Arthrotec - gastritis   . Nsaids Other (See Comments)    gastritis  . Zolpidem Other (See Comments)    Sleep walking   . Hydrocodone-Acetaminophen Nausea And Vomiting  . Simvastatin Other (See Comments)    body aches    Metabolic Disorder Labs: Lab Results  Component Value Date   HGBA1C 6.4 (H) 01/02/2019   MPG 136.98 01/02/2019   No results found for: PROLACTIN No results found for: CHOL, TRIG, HDL, CHOLHDL, VLDL, LDLCALC No results found for: TSH  Therapeutic Level Labs: No results found for: LITHIUM No results found for: VALPROATE No components found for:  CBMZ  Current Medications: Current Outpatient Medications  Medication Sig Dispense Refill  . ACCU-CHEK FASTCLIX LANCETS MISC     . ACCU-CHEK SMARTVIEW test strip     . albuterol (PROVENTIL HFA) 108 (90 BASE) MCG/ACT inhaler Inhale 2 puffs into the lungs every 4 (four) hours as needed for wheezing or shortness of breath.     Marland Kitchen albuterol (PROVENTIL) (2.5 MG/3ML) 0.083% nebulizer solution Take 2.5 mg by  nebulization every 6 (six) hours as needed for wheezing or shortness of breath.    Marland Kitchen amLODipine (NORVASC) 10 MG tablet Take 10 mg by mouth daily.     Marland Kitchen aspirin 81 MG chewable tablet Chew 1 tablet (81 mg total) by mouth daily. Continue holding now, as you are doing until procedure. (Patient taking differently: Chew 81 mg by mouth daily. )  30 tablet 0  . carbidopa-levodopa (SINEMET IR) 25-100 MG tablet Take 1 tablet by mouth 3 (three) times daily.    . diclofenac sodium (VOLTAREN) 1 % GEL     . esomeprazole (NEXIUM) 40 MG capsule Take 40 mg by mouth daily.    . ferrous sulfate 325 (65 FE) MG tablet Take 1 tablet (325 mg total) by mouth 2 (two) times daily with a meal. 30 tablet 3  . fluticasone (FLONASE) 50 MCG/ACT nasal spray Place 2 sprays into both nostrils daily as needed for allergies.     . Fluticasone-Umeclidin-Vilant 100-62.5-25 MCG/INH AEPB Inhale 1 puff into the lungs daily. Treligy    . gabapentin (NEURONTIN) 100 MG capsule Take 1 capsule (100 mg total) by mouth 2 (two) times daily. QAM, QNOON (Patient taking differently: Take 200 mg by mouth daily. QAM) 180 capsule 2  . gabapentin (NEURONTIN) 100 MG capsule Take 2 capsules in the morning 180 capsule 1  . gabapentin (NEURONTIN) 400 MG capsule Take 1 capsule (400 mg total) by mouth 2 (two) times daily. Take 1 capsule at 1pm and take 1 capsule at 8pm 180 capsule 1  . ipratropium (ATROVENT) 0.02 % nebulizer solution Take 2.5 mLs (0.5 mg total) by nebulization every 6 (six) hours as needed for wheezing or shortness of breath. 75 mL 12  . metFORMIN (GLUCOPHAGE) 500 MG tablet Take 500 mg by mouth daily with breakfast.     . metoprolol tartrate (LOPRESSOR) 25 MG tablet Take 25 mg by mouth 2 (two) times daily.     . mirabegron ER (MYRBETRIQ) 25 MG TB24 tablet Take 1 tablet by mouth daily.    . Multiple Vitamin (MULTIVITAMIN) tablet Take 1 tablet by mouth daily.    . ondansetron (ZOFRAN) 8 MG tablet Take 1 tablet (8 mg total) by mouth 2 (two) times  daily. 60 tablet 0  . OXYGEN Inhale 2 L into the lungs continuous.    . potassium chloride (KLOR-CON) 8 MEQ tablet Take 8 mEq by mouth daily.     . pravastatin (PRAVACHOL) 80 MG tablet Take 80 mg by mouth every evening.     . prochlorperazine (COMPAZINE) 10 MG tablet Take 1 tablet (10 mg total) by mouth every 6 (six) hours as needed (Nausea or vomiting). 60 tablet 2  . rOPINIRole (REQUIP) 1 MG tablet Take 1 mg by mouth at bedtime.    . solifenacin (VESICARE) 10 MG tablet Take 10 mg by mouth daily.    Marland Kitchen spironolactone (ALDACTONE) 50 MG tablet Take 50 mg by mouth daily.     . sucralfate (CARAFATE) 1 g tablet Take 1 tablet (1 g total) by mouth 3 (three) times daily. Dissolve in 3-4 tbsp warm water, swish and swallow 90 tablet 3  . temazepam (RESTORIL) 30 MG capsule Take 1 capsule (30 mg total) by mouth at bedtime as needed for sleep. 30 capsule 1  . venlafaxine XR (EFFEXOR-XR) 150 MG 24 hr capsule Take 1 capsule (150 mg total) by mouth daily with breakfast. 90 capsule 1   No current facility-administered medications for this visit.      Psychiatric Specialty Exam: ROS  There were no vitals taken for this visit.There is no height or weight on file to calculate BMI.  General Appearance: Unable to assess due to phone visit  Eye Contact: Unable to assess due to phone visit  Speech:  Clear and Coherent and Normal Rate  Volume:  Normal  Mood:  Euthymic  Affect:  Congruent  Thought Process:  Goal Directed, Linear and Descriptions of Associations: Intact  Orientation:  Full (Time, Place, and Person)  Thought Content: Logical   Suicidal Thoughts:  No  Homicidal Thoughts:  No  Memory:  Recent;   Good Remote;   Good  Judgement:  Good  Insight:  Good  Psychomotor Activity:  Normal  Concentration:  Concentration: Good and Attention Span: Good  Recall:  Good  Fund of Knowledge: Good  Language: Negative  Akathisia:  Negative  Handed:  Right  AIMS (if indicated): not done  Assets:   Communication Skills Desire for Improvement Financial Resources/Insurance Resilience Social Support  ADL's:  Intact  Cognition: WNL  Sleep:  Fair   Screenings: PHQ2-9     Patient Outreach Telephone from 02/28/2017 in [place]  PHQ-2 Total Score 1       Assessment and Plan: Patient continues to have difficulty in staying asleep.  Patient was agreeable to trial of doxepin. Potential side effects of medication and risks vs benefits of treatment vs non-treatment were explained and discussed. All questions were answered.   1. MDD (major depressive disorder), recurrent, in full remission ([place])  - venlafaxine XR (EFFEXOR-XR) 150 MG 24 hr capsule; Take 1 capsule (150 mg total) by mouth daily with breakfast.  Dispense: 90 capsule; Refill: 1 - gabapentin (NEURONTIN) 100 MG capsule; Take 2 capsules in the morning  Dispense: 180 capsule; Refill: 1 - gabapentin (NEURONTIN) 400 MG capsule; Take 1 capsule (400 mg total) by mouth 2 (two) times daily. Take 1 capsule at 1pm and take 1 capsule at 8pm  Dispense: 180 capsule; Refill: 1 - Discontinue Temazepam. - Start Doxepin 25 mg HS. May take 2 capsules if one is not effective.   2. GAD (generalized anxiety disorder) - venlafaxine XR (EFFEXOR-XR) 150 MG 24 hr capsule; Take 1 capsule (150 mg total) by mouth daily with breakfast.  Dispense: 90 capsule; Refill: 1  Follow-up in 2 months.   Patient was encouraged to call sooner if needed.  [place] Crane, MD 05/10/2020, 10:02 AM

## 2020-05-31 DIAGNOSIS — F17218 Nicotine dependence, cigarettes, with other nicotine-induced disorders: Secondary | ICD-10-CM | POA: Diagnosis not present

## 2020-05-31 DIAGNOSIS — C3491 Malignant neoplasm of unspecified part of right bronchus or lung: Secondary | ICD-10-CM | POA: Diagnosis not present

## 2020-05-31 DIAGNOSIS — J449 Chronic obstructive pulmonary disease, unspecified: Secondary | ICD-10-CM | POA: Diagnosis not present

## 2020-05-31 DIAGNOSIS — R0600 Dyspnea, unspecified: Secondary | ICD-10-CM | POA: Diagnosis not present

## 2020-06-27 ENCOUNTER — Telehealth (HOSPITAL_COMMUNITY): Payer: Self-pay | Admitting: *Deleted

## 2020-06-27 NOTE — Telephone Encounter (Signed)
Pharmacy request for Restoril to [facility]. She has an appt on 07/06/20 with Dr Toy Care. Will bring this request to her attention.

## 2020-06-27 NOTE — Telephone Encounter (Signed)
Restoril was discontinued at the time of appointment in July.

## 2020-07-06 ENCOUNTER — Other Ambulatory Visit: Payer: Self-pay

## 2020-07-06 ENCOUNTER — Telehealth (INDEPENDENT_AMBULATORY_CARE_PROVIDER_SITE_OTHER): Payer: Medicare HMO | Admitting: Psychiatry

## 2020-07-06 ENCOUNTER — Encounter (HOSPITAL_COMMUNITY): Payer: Self-pay | Admitting: Psychiatry

## 2020-07-06 DIAGNOSIS — F3342 Major depressive disorder, recurrent, in full remission: Secondary | ICD-10-CM | POA: Diagnosis not present

## 2020-07-06 DIAGNOSIS — F411 Generalized anxiety disorder: Secondary | ICD-10-CM | POA: Diagnosis not present

## 2020-07-06 MED ORDER — VENLAFAXINE HCL ER 150 MG PO CP24: 150.0000 mg | ORAL_CAPSULE | Freq: Every day | ORAL | 1 refills | Status: DC

## 2020-07-06 MED ORDER — GABAPENTIN 400 MG PO CAPS: 400.0000 mg | ORAL_CAPSULE | Freq: Two times a day (BID) | ORAL | 1 refills | Status: DC

## 2020-07-06 MED ORDER — GABAPENTIN 100 MG PO CAPS: 200.0000 mg | ORAL_CAPSULE | Freq: Every day | ORAL | 1 refills | Status: DC

## 2020-07-06 MED ORDER — ESZOPICLONE 2 MG PO TABS: 2.0000 mg | ORAL_TABLET | Freq: Every day | ORAL | 2 refills | Status: DC

## 2020-07-06 NOTE — Progress Notes (Signed)
[facility] MD OP Progress Note  Virtual Visit via Telephone Note  I connected with Suzanne Glenn on 07/06/20 at 11:40 AM EDT by telephone and verified that I am speaking with the correct person using two identifiers.  Location: Patient: home Provider: Clinic   I discussed the limitations, risks, security and privacy concerns of performing an evaluation and management service by telephone and the availability of in person appointments. I also discussed with the patient that there may be a patient responsible charge related to this service. The patient expressed understanding and agreed to proceed.   I provided 14 minutes of non-face-to-face time during this encounter.   07/06/2020 11:45 AM Suzanne Glenn  MRN:  [id number]  Chief Complaint:  " I am still not getting any sleep."   HPI: Patient reported that she still not getting any sleep.  She stated that she tried that the medicine doxepin and did not help at all.  She still struggles when she lays down for sleep at bedtime.  She gets an hour or 2 of sleep after waking up frequently at night.  She stated that she wants to try something different as long as she can sleep better.  She was offered [place].  She was willing to give it a try. She reported her mood has been stable and denied any other concerns at this time.   Visit Diagnosis:    ICD-10-CM   1. MDD (major depressive disorder), recurrent, in full remission ([place])  F33.42     Past Psychiatric History: Depression , anxiety  Past Medical History:  Past Medical History:  Diagnosis Date  . Anxiety   . Asthma   . Cancer ([place]) 12/2018   w/u for right upper lobe mass/cancer  . COPD (chronic obstructive pulmonary disease) ([facility])    also, emphysema. now using o2 via np 24 hours a day  . Coronary artery disease   . Depression   . Diabetes mellitus, type II ([place])   . GERD (gastroesophageal reflux disease)   . Headache    migraines in early 20's  . Heart murmur   .  Hypertension   . Lung cancer ([place])   . Neuropathy   . Restless leg     Past Surgical History:  Procedure Laterality Date  . BACK SURGERY  2005   surgery x 2, disc fused in neck, pinched nerve in center of back and neck  . CARDIAC CATHETERIZATION  2015   1 stent placed for blockage  . cervical bone infusion    . CHOLECYSTECTOMY    . THORACIC DISC SURGERY    . TUBAL LIGATION    . VIDEO BRONCHOSCOPY WITH ENDOBRONCHIAL ULTRASOUND N/A 03/06/2019   Procedure: VIDEO BRONCHOSCOPY WITH ENDOBRONCHIAL ULTRASOUND - DIABETIC;  Surgeon: Ottie Glazier, MD;  Location: [facility] ORS;  Service: Thoracic;  Laterality: N/A;    Family Psychiatric History: See below  Family History:  Family History  Problem Relation Age of Onset  . Anxiety disorder Mother   . Cancer - Lung Mother   . Depression Sister   . Cancer Sister   . Depression Brother   . Cancer Sister   . Cancer Sister   . Cancer Sister   . Heart Problems Sister   . Bipolar disorder Sister   . Diabetes Sister   . Cancer - Lung Sister   . Hypertension Sister   . Diabetes Sister   . Hyperlipidemia Sister   . COPD Sister   . Heart Problems Brother   .  Cancer Brother   . Cancer Brother   . Cancer Brother   . Breast cancer Maternal Aunt     Social History:  Social History   Socioeconomic History  . Marital status: Married    Spouse name: Abe People  . Number of children: Not on file  . Years of education: Not on file  . Highest education level: Not on file  Occupational History    Comment: disabled  Tobacco Use  . Smoking status: Current Every Day Smoker    Packs/day: 1.00    Years: 40.00    Pack years: 40.00    Types: Cigarettes    Start date: 05/05/1975  . Smokeless tobacco: Never Used  Vaping Use  . Vaping Use: Never used  Substance and Sexual Activity  . Alcohol use: No    Alcohol/week: 0.0 standard drinks    Comment: socially  . Drug use: No  . Sexual activity: Not Currently  Other Topics Concern  . Not on file   Social History Narrative  . Not on file   Social Determinants of Health   Financial Resource Strain:   . Difficulty of Paying Living Expenses: Not on file  Food Insecurity:   . Worried About Charity fundraiser in the Last Year: Not on file  . Ran Out of Food in the Last Year: Not on file  Transportation Needs:   . Lack of Transportation (Medical): Not on file  . Lack of Transportation (Non-Medical): Not on file  Physical Activity:   . Days of Exercise per Week: Not on file  . Minutes of Exercise per Session: Not on file  Stress:   . Feeling of Stress : Not on file  Social Connections:   . Frequency of Communication with Friends and Family: Not on file  . Frequency of Social Gatherings with Friends and Family: Not on file  . Attends Religious Services: Not on file  . Active Member of Clubs or Organizations: Not on file  . Attends Archivist Meetings: Not on file  . Marital Status: Not on file    Allergies:  Allergies  Allergen Reactions  . Diclofenac-Misoprostol Other (See Comments)    Arthrotec - gastritis   . Nsaids Other (See Comments)    gastritis  . Zolpidem Other (See Comments)    Sleep walking   . Hydrocodone-Acetaminophen Nausea And Vomiting  . Simvastatin Other (See Comments)    body aches    Metabolic Disorder Labs: Lab Results  Component Value Date   HGBA1C 6.4 (H) 01/02/2019   MPG 136.98 01/02/2019   No results found for: PROLACTIN No results found for: CHOL, TRIG, HDL, CHOLHDL, VLDL, LDLCALC No results found for: TSH  Therapeutic Level Labs: No results found for: LITHIUM No results found for: VALPROATE No components found for:  CBMZ  Current Medications: Current Outpatient Medications  Medication Sig Dispense Refill  . ACCU-CHEK FASTCLIX LANCETS MISC     . ACCU-CHEK SMARTVIEW test strip     . albuterol (PROVENTIL HFA) 108 (90 BASE) MCG/ACT inhaler Inhale 2 puffs into the lungs every 4 (four) hours as needed for wheezing or  shortness of breath.     Marland Kitchen albuterol (PROVENTIL) (2.5 MG/3ML) 0.083% nebulizer solution Take 2.5 mg by nebulization every 6 (six) hours as needed for wheezing or shortness of breath.    Marland Kitchen amLODipine (NORVASC) 10 MG tablet Take 10 mg by mouth daily.     Marland Kitchen aspirin 81 MG chewable tablet Chew 1 tablet (81 mg  total) by mouth daily. Continue holding now, as you are doing until procedure. (Patient taking differently: Chew 81 mg by mouth daily. ) 30 tablet 0  . carbidopa-levodopa (SINEMET IR) 25-100 MG tablet Take 1 tablet by mouth 3 (three) times daily.    . diclofenac sodium (VOLTAREN) 1 % GEL     . doxepin (SINEQUAN) 25 MG capsule Take one to two capsules as needed for sleep at bedtime 180 capsule 0  . esomeprazole (NEXIUM) 40 MG capsule Take 40 mg by mouth daily.    . ferrous sulfate 325 (65 FE) MG tablet Take 1 tablet (325 mg total) by mouth 2 (two) times daily with a meal. 30 tablet 3  . fluticasone (FLONASE) 50 MCG/ACT nasal spray Place 2 sprays into both nostrils daily as needed for allergies.     . Fluticasone-Umeclidin-Vilant 100-62.5-25 MCG/INH AEPB Inhale 1 puff into the lungs daily. Treligy    . gabapentin (NEURONTIN) 100 MG capsule Take 1 capsule (100 mg total) by mouth 2 (two) times daily. QAM, QNOON (Patient taking differently: Take 200 mg by mouth daily. QAM) 180 capsule 2  . gabapentin (NEURONTIN) 100 MG capsule Take 2 capsules in the morning 180 capsule 1  . gabapentin (NEURONTIN) 400 MG capsule Take 1 capsule (400 mg total) by mouth 2 (two) times daily. Take 1 capsule at 1pm and take 1 capsule at 8pm 180 capsule 1  . ipratropium (ATROVENT) 0.02 % nebulizer solution Take 2.5 mLs (0.5 mg total) by nebulization every 6 (six) hours as needed for wheezing or shortness of breath. 75 mL 12  . metFORMIN (GLUCOPHAGE) 500 MG tablet Take 500 mg by mouth daily with breakfast.     . metoprolol tartrate (LOPRESSOR) 25 MG tablet Take 25 mg by mouth 2 (two) times daily.     . mirabegron ER (MYRBETRIQ)  25 MG TB24 tablet Take 1 tablet by mouth daily.    . Multiple Vitamin (MULTIVITAMIN) tablet Take 1 tablet by mouth daily.    . ondansetron (ZOFRAN) 8 MG tablet Take 1 tablet (8 mg total) by mouth 2 (two) times daily. 60 tablet 0  . OXYGEN Inhale 2 L into the lungs continuous.    . potassium chloride (KLOR-CON) 8 MEQ tablet Take 8 mEq by mouth daily.     . pravastatin (PRAVACHOL) 80 MG tablet Take 80 mg by mouth every evening.     . prochlorperazine (COMPAZINE) 10 MG tablet Take 1 tablet (10 mg total) by mouth every 6 (six) hours as needed (Nausea or vomiting). 60 tablet 2  . rOPINIRole (REQUIP) 1 MG tablet Take 1 mg by mouth at bedtime.    . solifenacin (VESICARE) 10 MG tablet Take 10 mg by mouth daily.    Marland Kitchen spironolactone (ALDACTONE) 50 MG tablet Take 50 mg by mouth daily.     . sucralfate (CARAFATE) 1 g tablet Take 1 tablet (1 g total) by mouth 3 (three) times daily. Dissolve in 3-4 tbsp warm water, swish and swallow 90 tablet 3  . venlafaxine XR (EFFEXOR-XR) 150 MG 24 hr capsule Take 1 capsule (150 mg total) by mouth daily with breakfast. 90 capsule 1   No current facility-administered medications for this visit.      Psychiatric Specialty Exam: ROS  There were no vitals taken for this visit.There is no height or weight on file to calculate BMI.  General Appearance: Unable to assess due to phone visit  Eye Contact: Unable to assess due to phone visit  Speech:  Clear and  Coherent and Normal Rate  Volume:  Normal  Mood:  Euthymic  Affect:  Congruent  Thought Process:  Goal Directed, Linear and Descriptions of Associations: Intact  Orientation:  Full (Time, Place, and Person)  Thought Content: Logical   Suicidal Thoughts:  No  Homicidal Thoughts:  No  Memory:  Recent;   Good Remote;   Good  Judgement:  Good  Insight:  Good  Psychomotor Activity:  Normal  Concentration:  Concentration: Good and Attention Span: Good  Recall:  Good  Fund of Knowledge: Good  Language: Negative   Akathisia:  Negative  Handed:  Right  AIMS (if indicated): not done  Assets:  Communication Skills Desire for Improvement Financial Resources/Insurance Resilience Social Support  ADL's:  Intact  Cognition: WNL  Sleep:  Poor    Screenings: PHQ2-9     Patient Outreach Telephone from 02/28/2017 in [place]  PHQ-2 Total Score 1       Assessment and Plan: Patient is still complaining of difficulty in falling and staying asleep.  She was offered trial of Lunesta. Potential side effects of medication and risks vs benefits of treatment vs non-treatment were explained and discussed. All questions were answered.   1. MDD (major depressive disorder), recurrent, in full remission ([place])  - venlafaxine XR (EFFEXOR-XR) 150 MG 24 hr capsule; Take 1 capsule (150 mg total) by mouth daily with breakfast.  Dispense: 90 capsule; Refill: 1 - gabapentin (NEURONTIN) 100 MG capsule; Take 2 capsules in the morning  Dispense: 180 capsule; Refill: 1 - gabapentin (NEURONTIN) 400 MG capsule; Take 1 capsule (400 mg total) by mouth 2 (two) times daily. Take 1 capsule at 1pm and take 1 capsule at 8pm  Dispense: 180 capsule; Refill: 1 -Start Lunesta 2 mg at bedtime.  2. GAD (generalized anxiety disorder) - venlafaxine XR (EFFEXOR-XR) 150 MG 24 hr capsule; Take 1 capsule (150 mg total) by mouth daily with breakfast.  Dispense: 90 capsule; Refill: 1  Follow-up in 3 months.   Patient was encouraged to call sooner if needed.  Suzanne Crane, MD 07/06/2020, 11:45 AM

## 2020-07-26 DIAGNOSIS — J9610 Chronic respiratory failure, unspecified whether with hypoxia or hypercapnia: Secondary | ICD-10-CM | POA: Diagnosis not present

## 2020-07-26 DIAGNOSIS — G2581 Restless legs syndrome: Secondary | ICD-10-CM | POA: Diagnosis not present

## 2020-07-26 DIAGNOSIS — J9611 Chronic respiratory failure with hypoxia: Secondary | ICD-10-CM | POA: Diagnosis not present

## 2020-07-26 DIAGNOSIS — Z720 Tobacco use: Secondary | ICD-10-CM | POA: Diagnosis not present

## 2020-07-26 DIAGNOSIS — E782 Mixed hyperlipidemia: Secondary | ICD-10-CM | POA: Diagnosis not present

## 2020-07-26 DIAGNOSIS — Z79899 Other long term (current) drug therapy: Secondary | ICD-10-CM | POA: Diagnosis not present

## 2020-07-26 DIAGNOSIS — I2510 Atherosclerotic heart disease of native coronary artery without angina pectoris: Secondary | ICD-10-CM | POA: Diagnosis not present

## 2020-07-26 DIAGNOSIS — J449 Chronic obstructive pulmonary disease, unspecified: Secondary | ICD-10-CM | POA: Diagnosis not present

## 2020-07-26 DIAGNOSIS — E7800 Pure hypercholesterolemia, unspecified: Secondary | ICD-10-CM | POA: Diagnosis not present

## 2020-07-26 DIAGNOSIS — E119 Type 2 diabetes mellitus without complications: Secondary | ICD-10-CM | POA: Diagnosis not present

## 2020-07-26 DIAGNOSIS — F324 Major depressive disorder, single episode, in partial remission: Secondary | ICD-10-CM | POA: Diagnosis not present

## 2020-07-26 DIAGNOSIS — F17210 Nicotine dependence, cigarettes, uncomplicated: Secondary | ICD-10-CM | POA: Diagnosis not present

## 2020-07-26 DIAGNOSIS — M25532 Pain in left wrist: Secondary | ICD-10-CM | POA: Diagnosis not present

## 2020-08-15 ENCOUNTER — Other Ambulatory Visit: Payer: Self-pay

## 2020-08-15 ENCOUNTER — Ambulatory Visit
Admission: RE | Admit: 2020-08-15 | Discharge: 2020-08-15 | Disposition: A | Discharge status: Home / Self Care | Payer: Medicare HMO | Source: Ambulatory Visit | Attending: Radiation Oncology | Admitting: Radiation Oncology

## 2020-08-15 ENCOUNTER — Encounter: Payer: Self-pay | Admitting: Radiation Oncology

## 2020-08-15 DIAGNOSIS — F17210 Nicotine dependence, cigarettes, uncomplicated: Secondary | ICD-10-CM | POA: Diagnosis not present

## 2020-08-15 DIAGNOSIS — R059 Cough, unspecified: Secondary | ICD-10-CM | POA: Insufficient documentation

## 2020-08-15 DIAGNOSIS — C3412 Malignant neoplasm of upper lobe, left bronchus or lung: Secondary | ICD-10-CM | POA: Insufficient documentation

## 2020-08-15 DIAGNOSIS — C3492 Malignant neoplasm of unspecified part of left bronchus or lung: Secondary | ICD-10-CM

## 2020-08-15 NOTE — Progress Notes (Signed)
Radiation Oncology Follow up Note  Name: Suzanne Glenn   Date:   08/15/2020 MRN:  [id number] DOB: [birth year]    This 69 y.o. 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  Seen today in routine follow-up she is doing well.  She is a mild cough although continues to smoke.  She is on nasal oxygen.Marland Kitchen  Her last CT scan which I have reviewed was back in May showing marked reduction size of the lingular mass.  She is scheduled for another CT scan next week which I will review when it becomes available.  COMPLICATIONS OF TREATMENT: none  FOLLOW UP COMPLIANCE: keeps appointments   PHYSICAL EXAM:  BP (P) 127/79 (BP Location: Left Arm, Patient Position: Sitting)   Pulse (P) 92   Resp (!) (P) 22   Wt (P) 203 lb 14.4 oz (92.5 kg)   BMI (P) 36.12 kg/m  Patient is on nasal oxygen.  Well-developed well-nourished patient in NAD. HEENT reveals PERLA, EOMI, discs not visualized.  Oral cavity is clear. No oral mucosal lesions are identified. Neck is clear without evidence of cervical or supraclavicular adenopathy. Lungs are clear to A&P. Cardiac examination is essentially unremarkable with regular rate and rhythm without murmur rub or thrill. Abdomen is benign with no organomegaly or masses noted. Motor sensory and DTR levels are equal and symmetric in the upper and lower extremities. Cranial nerves II through XII are grossly intact. Proprioception is intact. No peripheral adenopathy or edema is identified. No motor or sensory levels are noted. Crude visual fields are within normal range.  RADIOLOGY RESULTS: CT scans reviewed we will review her CT scan from next week when it becomes available.  PLAN: Present time  patient is doing fairly well continue to watch some nodularity in the region of her previously treated lingular mass.  I have otherwise asked to see her back in 6 months for follow-up.  Patient knows to call with any concerns.  I would like to take this opportunity to thank you for allowing me to participate in the care of your patient.Noreene Filbert, MD

## 2020-08-27 NOTE — Progress Notes (Signed)
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  INTERVAL HISTORY: Patient returns to clinic today for routine 18-monthevaluation and discussion of her imaging results.  She currently feels well and is at her baseline.  She continues to have chronic shortness of breath requiring oxygen 24 hours a day. She has no neurologic complaints.  She denies any recent fevers or illnesses.  She has a good appetite and denies weight loss.  She denies any pain.  She has no chest pain, cough, or hemoptysis.  She denies any nausea, vomiting, constipation, or diarrhea.  She has no urinary complaints.  Patient offers no further specific complaints today.  REVIEW OF SYSTEMS:   Review of Systems  Constitutional: Negative.  Negative for fever, malaise/fatigue and weight loss.  Respiratory: Positive for shortness of breath. Negative for cough and hemoptysis.   Cardiovascular: Negative.  Negative for chest pain and leg swelling.  Gastrointestinal: Negative.  Negative for abdominal pain.  Genitourinary: Negative.  Negative for dysuria.  Musculoskeletal: Negative.  Negative for back pain.  Skin: Negative.  Negative for rash.  Neurological: Negative.  Negative for dizziness, weakness and headaches.  Psychiatric/Behavioral: Negative.  The patient is not nervous/anxious.     As per HPI. Otherwise, a complete review of systems is negative.  PAST MEDICAL HISTORY: Past Medical History:  Diagnosis Date  . Anxiety   . Asthma   . Cancer ([facility] 12/2018   w/u for right upper lobe mass/cancer  . COPD (chronic obstructive  pulmonary disease) (HCC)    also, emphysema. now using o2 via np 24 hours a day  . Coronary artery disease   . Depression   . Diabetes mellitus, type II ([facility]   . GERD (gastroesophageal reflux disease)   . Headache    migraines in early 20's  . Heart murmur   . Hypertension   . Lung cancer ([facility]   . Neuropathy   . Restless leg     PAST SURGICAL HISTORY: Past Surgical History:  Procedure Laterality Date  . BACK SURGERY  2005   surgery x 2, disc fused in neck, pinched nerve in center of back and neck  . CARDIAC CATHETERIZATION  2015   1 stent placed for blockage  . cervical bone infusion    . CHOLECYSTECTOMY    . THORACIC DISC SURGERY    . TUBAL LIGATION    . VIDEO BRONCHOSCOPY WITH ENDOBRONCHIAL ULTRASOUND N/A 03/06/2019   Procedure: VIDEO BRONCHOSCOPY WITH ENDOBRONCHIAL ULTRASOUND - DIABETIC;  Surgeon: AOttie Glazier MD;  Location: [facility] ORS;  Service: Thoracic;  Laterality: N/A;    FAMILY HISTORY: Family History  Problem Relation Age of Onset  . Anxiety disorder Mother   . Cancer - Lung Mother   . Depression Sister   . Cancer Sister   . Depression Brother   . Cancer Sister   . Cancer Sister   . Cancer Sister   . Heart Problems Sister   . Bipolar disorder Sister   . Diabetes Sister   . Cancer - Lung Sister   . Hypertension Sister   . Diabetes Sister   . Hyperlipidemia Sister   . COPD Sister   .  Heart Problems Brother   . Cancer Brother   . Cancer Brother   . Cancer Brother   . Breast cancer Maternal Aunt     ADVANCED DIRECTIVES (Y/N):  N  HEALTH MAINTENANCE: Social History   Tobacco Use  . Smoking status: Current Every Day Smoker    Packs/day: 1.00    Years: 40.00    Pack years: 40.00    Types: Cigarettes    Start date: 05/05/1975  . Smokeless tobacco: Never Used  Vaping Use  . Vaping Use: Never used  Substance Use Topics  . Alcohol use: No    Alcohol/week: 0.0 standard drinks    Comment: socially  . Drug use: No      Colonoscopy:  PAP:  Bone density:  Lipid panel:  Allergies  Allergen Reactions  . Diclofenac-Misoprostol Other (See Comments)    Arthrotec - gastritis   . Nsaids Other (See Comments)    gastritis  . Zolpidem Other (See Comments)    Sleep walking   . Hydrocodone-Acetaminophen Nausea And Vomiting  . Simvastatin Other (See Comments)    body aches    Current Outpatient Medications  Medication Sig Dispense Refill  . ACCU-CHEK FASTCLIX LANCETS MISC     . ACCU-CHEK SMARTVIEW test strip     . albuterol (PROVENTIL HFA) 108 (90 BASE) MCG/ACT inhaler Inhale 2 puffs into the lungs every 4 (four) hours as needed for wheezing or shortness of breath.     Marland Kitchen amLODipine (NORVASC) 10 MG tablet Take 10 mg by mouth daily.     Marland Kitchen aspirin 81 MG chewable tablet Chew 1 tablet (81 mg total) by mouth daily. Continue holding now, as you are doing until procedure. (Patient taking differently: Chew 81 mg by mouth daily. ) 30 tablet 0  . carbidopa-levodopa (SINEMET IR) 25-100 MG tablet Take 1 tablet by mouth 3 (three) times daily.    Marland Kitchen esomeprazole (NEXIUM) 40 MG capsule Take 40 mg by mouth daily.    . eszopiclone (LUNESTA) 2 MG TABS tablet Take 1 tablet (2 mg total) by mouth at bedtime. Take immediately before bedtime 30 tablet 2  . ferrous sulfate 325 (65 FE) MG tablet Take 1 tablet (325 mg total) by mouth 2 (two) times daily with a meal. 30 tablet 3  . Fluticasone-Umeclidin-Vilant 100-62.5-25 MCG/INH AEPB Inhale 1 puff into the lungs daily. Treligy    . gabapentin (NEURONTIN) 100 MG capsule Take 2 capsules in the morning 180 capsule 1  . gabapentin (NEURONTIN) 100 MG capsule Take 2 capsules (200 mg total) by mouth daily. QAM 180 capsule 1  . gabapentin (NEURONTIN) 400 MG capsule Take 1 capsule (400 mg total) by mouth 2 (two) times daily. Take 1 capsule at 1pm and take 1 capsule at 8pm 180 capsule 1  . metFORMIN (GLUCOPHAGE) 500 MG tablet Take 500 mg by mouth daily with breakfast.     . metoprolol  tartrate (LOPRESSOR) 25 MG tablet Take 25 mg by mouth 2 (two) times daily.     . OXYGEN Inhale 2 L into the lungs continuous.    . potassium chloride (KLOR-CON) 8 MEQ tablet Take 8 mEq by mouth daily.     . pravastatin (PRAVACHOL) 80 MG tablet Take 80 mg by mouth every evening.     Marland Kitchen spironolactone (ALDACTONE) 25 MG tablet Take 25 mg by mouth daily.     Marland Kitchen venlafaxine XR (EFFEXOR-XR) 150 MG 24 hr capsule Take 1 capsule (150 mg total) by mouth daily with breakfast. 90 capsule 1  .  albuterol (PROVENTIL) (2.5 MG/3ML) 0.083% nebulizer solution Take 2.5 mg by nebulization every 6 (six) hours as needed for wheezing or shortness of breath. (Patient not taking: Reported on 09/01/2020)    . diclofenac sodium (VOLTAREN) 1 % GEL  (Patient not taking: Reported on 09/01/2020)    . fluticasone (FLONASE) 50 MCG/ACT nasal spray Place 2 sprays into both nostrils daily as needed for allergies.  (Patient not taking: Reported on 09/01/2020)    . ipratropium (ATROVENT) 0.02 % nebulizer solution Take 2.5 mLs (0.5 mg total) by nebulization every 6 (six) hours as needed for wheezing or shortness of breath. (Patient not taking: Reported on 09/01/2020) 75 mL 12  . mirabegron ER (MYRBETRIQ) 25 MG TB24 tablet Take 1 tablet by mouth daily. (Patient not taking: Reported on 09/01/2020)    . Multiple Vitamin (MULTIVITAMIN) tablet Take 1 tablet by mouth daily. (Patient not taking: Reported on 09/01/2020)    . ondansetron (ZOFRAN) 8 MG tablet Take 1 tablet (8 mg total) by mouth 2 (two) times daily. (Patient not taking: Reported on 09/01/2020) 60 tablet 0  . prochlorperazine (COMPAZINE) 10 MG tablet Take 1 tablet (10 mg total) by mouth every 6 (six) hours as needed (Nausea or vomiting). (Patient not taking: Reported on 09/01/2020) 60 tablet 2  . rOPINIRole (REQUIP) 1 MG tablet Take 1 mg by mouth at bedtime. (Patient not taking: Reported on 09/01/2020)     No current facility-administered medications for this visit.     OBJECTIVE: Vitals:   09/01/20 1043  BP: (!) 148/67  Pulse: 87  Temp: 98.5 F (36.9 C)  SpO2: 94%     Body mass index is 35.87 kg/m.    ECOG FS:0 - Asymptomatic  General: Well-developed, well-nourished, no acute distress. Eyes: Pink conjunctiva, anicteric sclera. HEENT: Normocephalic, moist mucous membranes. Lungs: No audible wheezing or coughing. Heart: Regular rate and rhythm. Abdomen: Soft, nontender, no obvious distention. Musculoskeletal: No edema, cyanosis, or clubbing. Neuro: Alert, answering all questions appropriately. Cranial nerves grossly intact. Skin: No rashes or petechiae noted. Psych: Normal affect.  LAB RESULTS:  Lab Results  Component Value Date   NA 137 11/25/2019   K 4.1 11/25/2019   CL 98 11/25/2019   CO2 31 11/25/2019   GLUCOSE 133 (H) 11/25/2019   BUN 16 11/25/2019   CREATININE 0.80 08/29/2020   CALCIUM 9.4 11/25/2019   PROT 7.4 11/25/2019   ALBUMIN 4.1 11/25/2019   AST 14 (L) 11/25/2019   ALT 5 11/25/2019   ALKPHOS 76 11/25/2019   BILITOT 0.4 11/25/2019   GFRNONAA >60 11/25/2019   GFRAA >60 11/25/2019    Lab Results  Component Value Date   WBC 8.4 11/25/2019   NEUTROABS 7.0 11/25/2019   HGB 12.3 11/25/2019   HCT 38.7 11/25/2019   MCV 98.0 11/25/2019   PLT 234 11/25/2019     STUDIES: CT CHEST W CONTRAST  Result Date: 08/29/2020 CLINICAL DATA:  Stage IIB left upper lobe squamous cell lung cancer status post chemotherapy and radiation therapy. Restaging. EXAM: CT CHEST WITH CONTRAST TECHNIQUE: Multidetector CT imaging of the chest was performed during intravenous contrast administration. CONTRAST:  46m OMNIPAQUE IOHEXOL 300 MG/ML  SOLN COMPARISON:  02/24/2020 chest CT.  11/25/2019 PET-CT. FINDINGS: Cardiovascular: Normal heart size. No significant pericardial effusion/thickening. Three-vessel coronary atherosclerosis. Atherosclerotic nonaneurysmal thoracic aorta. Normal caliber pulmonary arteries. No central pulmonary emboli.  Mediastinum/Nodes: No discrete thyroid nodules. Unremarkable esophagus. No pathologically enlarged axillary, mediastinal or hilar lymph nodes. Lungs/Pleura: No pneumothorax. No pleural effusion. Mild centrilobular emphysema. Medial  left upper lobe 1.6 x 1.6 cm solid pulmonary nodule (series 3/image 72) previously 2.0 x 1.8 cm on 02/24/2020 chest CT, mildly decreased. Adjacent more peripheral left upper lobe 1.3 x 0.7 cm nodule (series 3/image 70), previously 1.6 x 1.0 cm using similar measurement technique, mildly decreased. Mildly increased mild patchy consolidation in the posterior left upper lobe, favor evolving postradiation change. Previously described scattered indistinct subcentimeter right pulmonary nodules have all resolved. Indistinct apical left upper lobe 0.4 cm nodule (series 3/image 29), previously 0.4 cm, stable. No new significant pulmonary nodules. Upper abdomen: Cholecystectomy. Subcentimeter hypodense right liver lesion is too small to characterize and is unchanged, presumably benign. Stable mildly irregular adrenals without discrete adrenal nodules. Musculoskeletal: No aggressive appearing focal osseous lesions. Moderate thoracic spondylosis. Surgical hardware from ACDF partially visualized. IMPRESSION: 1. Continued mild reduction in size of left upper lobe pulmonary nodules, compatible with response to therapy. 2. Interval resolution of previously described scattered indistinct subcentimeter right pulmonary nodules, favoring resolved inflammatory nodularity. 3. No findings highly suspicious for metastatic disease in the chest. Tiny apical left upper lobe nodule is stable and warrants continued chest CT surveillance. 4. Three-vessel coronary atherosclerosis. 5. Aortic Atherosclerosis (ICD10-I70.0) and Emphysema (ICD10-J43.9). Electronically Signed   By: Ilona Sorrel M.D.   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  PD-L1 85%.  PLAN:     1. Squamous cell carcinoma of the upper lobe of left lung: Patient completed concurrent chemotherapy with weekly carboplatinum and Taxol along with daily XRT on approximately September 24 2019.  CT scan results from August 29, 2020 reviewed independently and reported as above with no obvious evidence of recurrent or progressive disease.  MRI of the brain on July 22, 2019 was reported as negative.  No intervention is needed at this time.  Return to clinic in 6 months with repeat imaging and further evaluation.   2.  Shortness of breath: Chronic and unchanged.  Continue oxygen as prescribed.  I spent a total of 20 minutes reviewing chart data, face-to-face evaluation with the patient, counseling and coordination of care as detailed above.   Patient expressed understanding and was in agreement with this plan. She also understands that She can call clinic at any time with any questions, concerns, or complaints.   Cancer Staging Squamous cell carcinoma of left lung [facility]) Staging form: Lung, AJCC 8th Edition - Clinical stage from 07/26/2019: Stage IIB (cT3, cN0, cM0) - Signed by Lloyd Huger, MD on 07/26/2019   Lloyd Huger, MD   09/03/2020 10:09 AM

## 2020-08-29 ENCOUNTER — Other Ambulatory Visit: Payer: Self-pay

## 2020-08-29 ENCOUNTER — Ambulatory Visit
Admission: RE | Admit: 2020-08-29 | Discharge: 2020-08-29 | Disposition: A | Discharge status: Home / Self Care | Payer: Medicare HMO | Source: Ambulatory Visit | Attending: Oncology | Admitting: Oncology

## 2020-08-29 DIAGNOSIS — J181 Lobar pneumonia, unspecified organism: Secondary | ICD-10-CM | POA: Diagnosis not present

## 2020-08-29 DIAGNOSIS — C3492 Malignant neoplasm of unspecified part of left bronchus or lung: Secondary | ICD-10-CM

## 2020-08-29 DIAGNOSIS — I2510 Atherosclerotic heart disease of native coronary artery without angina pectoris: Secondary | ICD-10-CM | POA: Diagnosis not present

## 2020-08-29 DIAGNOSIS — C3412 Malignant neoplasm of upper lobe, left bronchus or lung: Secondary | ICD-10-CM | POA: Diagnosis not present

## 2020-08-29 DIAGNOSIS — J432 Centrilobular emphysema: Secondary | ICD-10-CM | POA: Diagnosis not present

## 2020-08-29 LAB — POCT I-STAT CREATININE: Creatinine, Ser: 0.8 mg/dL (ref 0.44–1.00)

## 2020-08-29 MED ORDER — IOHEXOL 300 MG/ML  SOLN
75.0000 mL | Freq: Once | INTRAMUSCULAR | Status: AC | PRN
  Administered 2020-08-29: 75 mL via INTRAVENOUS

## 2020-09-01 ENCOUNTER — Encounter: Payer: Self-pay | Admitting: Oncology

## 2020-09-01 ENCOUNTER — Inpatient Hospital Stay: Payer: Medicare HMO | Attending: Oncology | Admitting: Oncology

## 2020-09-01 ENCOUNTER — Other Ambulatory Visit: Payer: Self-pay

## 2020-09-01 VITALS — BP 148/67 | HR 87 | Temp 98.5°F | Wt 202.5 lb

## 2020-09-01 DIAGNOSIS — R0602 Shortness of breath: Secondary | ICD-10-CM | POA: Diagnosis not present

## 2020-09-01 DIAGNOSIS — Z923 Personal history of irradiation: Secondary | ICD-10-CM | POA: Insufficient documentation

## 2020-09-01 DIAGNOSIS — Z9981 Dependence on supplemental oxygen: Secondary | ICD-10-CM | POA: Diagnosis not present

## 2020-09-01 DIAGNOSIS — F17210 Nicotine dependence, cigarettes, uncomplicated: Secondary | ICD-10-CM | POA: Diagnosis not present

## 2020-09-01 DIAGNOSIS — C3492 Malignant neoplasm of unspecified part of left bronchus or lung: Secondary | ICD-10-CM | POA: Diagnosis not present

## 2020-09-01 DIAGNOSIS — Z9221 Personal history of antineoplastic chemotherapy: Secondary | ICD-10-CM | POA: Diagnosis not present

## 2020-09-01 DIAGNOSIS — C3412 Malignant neoplasm of upper lobe, left bronchus or lung: Secondary | ICD-10-CM | POA: Diagnosis not present

## 2020-09-01 NOTE — Progress Notes (Signed)
Patient reports episodes of nausea.  Left side chest pain that does radiate to her back that she has been feeling for a couple of months.

## 2020-09-13 DIAGNOSIS — J9611 Chronic respiratory failure with hypoxia: Secondary | ICD-10-CM | POA: Diagnosis not present

## 2020-09-13 DIAGNOSIS — E538 Deficiency of other specified B group vitamins: Secondary | ICD-10-CM | POA: Diagnosis not present

## 2020-09-13 DIAGNOSIS — I10 Essential (primary) hypertension: Secondary | ICD-10-CM | POA: Diagnosis not present

## 2020-09-13 DIAGNOSIS — J449 Chronic obstructive pulmonary disease, unspecified: Secondary | ICD-10-CM | POA: Diagnosis not present

## 2020-09-13 DIAGNOSIS — F3341 Major depressive disorder, recurrent, in partial remission: Secondary | ICD-10-CM | POA: Diagnosis not present

## 2020-09-13 DIAGNOSIS — E1165 Type 2 diabetes mellitus with hyperglycemia: Secondary | ICD-10-CM | POA: Diagnosis not present

## 2020-09-13 DIAGNOSIS — C3492 Malignant neoplasm of unspecified part of left bronchus or lung: Secondary | ICD-10-CM | POA: Diagnosis not present

## 2020-09-13 DIAGNOSIS — I2510 Atherosclerotic heart disease of native coronary artery without angina pectoris: Secondary | ICD-10-CM | POA: Diagnosis not present

## 2020-09-13 DIAGNOSIS — G2581 Restless legs syndrome: Secondary | ICD-10-CM | POA: Diagnosis not present

## 2020-09-15 DIAGNOSIS — J449 Chronic obstructive pulmonary disease, unspecified: Secondary | ICD-10-CM | POA: Diagnosis not present

## 2020-09-15 DIAGNOSIS — F324 Major depressive disorder, single episode, in partial remission: Secondary | ICD-10-CM | POA: Diagnosis not present

## 2020-09-15 DIAGNOSIS — I2510 Atherosclerotic heart disease of native coronary artery without angina pectoris: Secondary | ICD-10-CM | POA: Diagnosis not present

## 2020-09-15 DIAGNOSIS — E119 Type 2 diabetes mellitus without complications: Secondary | ICD-10-CM | POA: Diagnosis not present

## 2020-09-15 DIAGNOSIS — J9610 Chronic respiratory failure, unspecified whether with hypoxia or hypercapnia: Secondary | ICD-10-CM | POA: Diagnosis not present

## 2020-09-15 DIAGNOSIS — M25532 Pain in left wrist: Secondary | ICD-10-CM | POA: Diagnosis not present

## 2020-09-15 DIAGNOSIS — Z0000 Encounter for general adult medical examination without abnormal findings: Secondary | ICD-10-CM | POA: Diagnosis not present

## 2020-09-15 DIAGNOSIS — F17210 Nicotine dependence, cigarettes, uncomplicated: Secondary | ICD-10-CM | POA: Diagnosis not present

## 2020-09-15 DIAGNOSIS — Z1159 Encounter for screening for other viral diseases: Secondary | ICD-10-CM | POA: Diagnosis not present

## 2020-09-26 ENCOUNTER — Other Ambulatory Visit (HOSPITAL_COMMUNITY): Payer: Self-pay | Admitting: Psychiatry

## 2020-09-26 DIAGNOSIS — F411 Generalized anxiety disorder: Secondary | ICD-10-CM

## 2020-09-26 MED ORDER — ESZOPICLONE 2 MG PO TABS: 2.0000 mg | ORAL_TABLET | Freq: Every day | ORAL | 2 refills | Status: DC

## 2020-09-26 NOTE — Progress Notes (Signed)
Rx sent 

## 2020-09-29 DIAGNOSIS — F17218 Nicotine dependence, cigarettes, with other nicotine-induced disorders: Secondary | ICD-10-CM | POA: Diagnosis not present

## 2020-09-29 DIAGNOSIS — J310 Chronic rhinitis: Secondary | ICD-10-CM | POA: Diagnosis not present

## 2020-09-29 DIAGNOSIS — R0600 Dyspnea, unspecified: Secondary | ICD-10-CM | POA: Diagnosis not present

## 2020-09-29 DIAGNOSIS — Z9981 Dependence on supplemental oxygen: Secondary | ICD-10-CM | POA: Diagnosis not present

## 2020-09-29 DIAGNOSIS — J449 Chronic obstructive pulmonary disease, unspecified: Secondary | ICD-10-CM | POA: Diagnosis not present

## 2020-09-29 DIAGNOSIS — C3492 Malignant neoplasm of unspecified part of left bronchus or lung: Secondary | ICD-10-CM | POA: Diagnosis not present

## 2020-09-30 ENCOUNTER — Telehealth (INDEPENDENT_AMBULATORY_CARE_PROVIDER_SITE_OTHER): Payer: Medicare HMO | Admitting: Psychiatry

## 2020-09-30 ENCOUNTER — Encounter (HOSPITAL_COMMUNITY): Payer: Self-pay | Admitting: Psychiatry

## 2020-09-30 ENCOUNTER — Other Ambulatory Visit: Payer: Self-pay

## 2020-09-30 DIAGNOSIS — F411 Generalized anxiety disorder: Secondary | ICD-10-CM | POA: Diagnosis not present

## 2020-09-30 DIAGNOSIS — F3342 Major depressive disorder, recurrent, in full remission: Secondary | ICD-10-CM | POA: Diagnosis not present

## 2020-09-30 MED ORDER — GABAPENTIN 100 MG PO CAPS: ORAL_CAPSULE | ORAL | 1 refills | Status: DC

## 2020-09-30 MED ORDER — VENLAFAXINE HCL ER 150 MG PO CP24: 150.0000 mg | ORAL_CAPSULE | Freq: Every day | ORAL | 1 refills | Status: DC

## 2020-09-30 MED ORDER — GABAPENTIN 400 MG PO CAPS: 400.0000 mg | ORAL_CAPSULE | Freq: Two times a day (BID) | ORAL | 1 refills | Status: DC

## 2020-09-30 NOTE — Progress Notes (Signed)
[place] MD OP Progress Note  Virtual Visit via Telephone Note  I connected with Suzanne Glenn on 09/30/20 at 11:00 AM EST by telephone and verified that I am speaking with the correct person using two identifiers.  Location: Patient: home Provider: Clinic   I discussed the limitations, risks, security and privacy concerns of performing an evaluation and management service by telephone and the availability of in person appointments. I also discussed with the patient that there may be a patient responsible charge related to this service. The patient expressed understanding and agreed to proceed.   I provided 15 minutes of non-face-to-face time during this encounter.   09/30/2020 11:16 AM Suzanne Glenn  MRN:  [id number]  Chief Complaint:  " I am sleeping slightly better."  HPI: Patient was contacted for follow-up visit today.  Patient reported that she is sleeping slightly better with help of Lunesta.  She stated that she gets 3 good nights of sleep during the week and on other nights she gets a few hours which is better than before.  She is still dealing with a lot of other health conditions and their treatments including COPD and squamous cell carcinoma of the lung. She stated that she tries to stay up-to-date with everything. She denied feeling overly depressed lately.  She said she does not really have any plans for traveling for the holidays.  Her son lives right across the street from her and they will be eating together with her and her husband for the Christmas holiday.   Visit Diagnosis:    ICD-10-CM   1. MDD (major depressive disorder), recurrent, in full remission ([place])  F33.42   2. GAD (generalized anxiety disorder)  F41.1     Past Psychiatric History: Depression , anxiety  Past Medical History:  Past Medical History:  Diagnosis Date  . Anxiety   . Asthma   . Cancer ([place]) 12/2018   w/u for right upper lobe mass/cancer  . COPD (chronic obstructive pulmonary  disease) (HCC)    also, emphysema. now using o2 via np 24 hours a day  . Coronary artery disease   . Depression   . Diabetes mellitus, type II ([place])   . GERD (gastroesophageal reflux disease)   . Headache    migraines in early 20's  . Heart murmur   . Hypertension   . Lung cancer ([place])   . Neuropathy   . Restless leg     Past Surgical History:  Procedure Laterality Date  . BACK SURGERY  2005   surgery x 2, disc fused in neck, pinched nerve in center of back and neck  . CARDIAC CATHETERIZATION  2015   1 stent placed for blockage  . cervical bone infusion    . CHOLECYSTECTOMY    . THORACIC DISC SURGERY    . TUBAL LIGATION    . VIDEO BRONCHOSCOPY WITH ENDOBRONCHIAL ULTRASOUND N/A 03/06/2019   Procedure: VIDEO BRONCHOSCOPY WITH ENDOBRONCHIAL ULTRASOUND - DIABETIC;  Surgeon: Ottie Glazier, MD;  Location: [facility] ORS;  Service: Thoracic;  Laterality: N/A;    Family Psychiatric History: See below  Family History:  Family History  Problem Relation Age of Onset  . Anxiety disorder Mother   . Cancer - Lung Mother   . Depression Sister   . Cancer Sister   . Depression Brother   . Cancer Sister   . Cancer Sister   . Cancer Sister   . Heart Problems Sister   . Bipolar disorder Sister   . Diabetes  Sister   . Cancer - Lung Sister   . Hypertension Sister   . Diabetes Sister   . Hyperlipidemia Sister   . COPD Sister   . Heart Problems Brother   . Cancer Brother   . Cancer Brother   . Cancer Brother   . Breast cancer Maternal Aunt     Social History:  Social History   Socioeconomic History  . Marital status: Married    Spouse name: Abe People  . Number of children: Not on file  . Years of education: Not on file  . Highest education level: Not on file  Occupational History    Comment: disabled  Tobacco Use  . Smoking status: Current Every Day Smoker    Packs/day: 1.00    Years: 40.00    Pack years: 40.00    Types: Cigarettes    Start date: 05/05/1975  . Smokeless  tobacco: Never Used  Vaping Use  . Vaping Use: Never used  Substance and Sexual Activity  . Alcohol use: No    Alcohol/week: 0.0 standard drinks    Comment: socially  . Drug use: No  . Sexual activity: Not Currently  Other Topics Concern  . Not on file  Social History Narrative  . Not on file   Social Determinants of Health   Financial Resource Strain: Not on file  Food Insecurity: Not on file  Transportation Needs: Not on file  Physical Activity: Not on file  Stress: Not on file  Social Connections: Not on file    Allergies:  Allergies  Allergen Reactions  . Diclofenac-Misoprostol Other (See Comments)    Arthrotec - gastritis   . Nsaids Other (See Comments)    gastritis  . Zolpidem Other (See Comments)    Sleep walking   . Hydrocodone-Acetaminophen Nausea And Vomiting  . Simvastatin Other (See Comments)    body aches    Metabolic Disorder Labs: Lab Results  Component Value Date   HGBA1C 6.4 (H) 01/02/2019   MPG 136.98 01/02/2019   No results found for: PROLACTIN No results found for: CHOL, TRIG, HDL, CHOLHDL, VLDL, LDLCALC No results found for: TSH  Therapeutic Level Labs: No results found for: LITHIUM No results found for: VALPROATE No components found for:  CBMZ  Current Medications: Current Outpatient Medications  Medication Sig Dispense Refill  . ACCU-CHEK FASTCLIX LANCETS MISC     . ACCU-CHEK SMARTVIEW test strip     . albuterol (PROVENTIL HFA) 108 (90 BASE) MCG/ACT inhaler Inhale 2 puffs into the lungs every 4 (four) hours as needed for wheezing or shortness of breath.     Marland Kitchen albuterol (PROVENTIL) (2.5 MG/3ML) 0.083% nebulizer solution Take 2.5 mg by nebulization every 6 (six) hours as needed for wheezing or shortness of breath. (Patient not taking: Reported on 09/01/2020)    . amLODipine (NORVASC) 10 MG tablet Take 10 mg by mouth daily.     Marland Kitchen aspirin 81 MG chewable tablet Chew 1 tablet (81 mg total) by mouth daily. Continue holding now, as you are  doing until procedure. (Patient taking differently: Chew 81 mg by mouth daily. ) 30 tablet 0  . carbidopa-levodopa (SINEMET IR) 25-100 MG tablet Take 1 tablet by mouth 3 (three) times daily.    . diclofenac sodium (VOLTAREN) 1 % GEL  (Patient not taking: Reported on 09/01/2020)    . esomeprazole (NEXIUM) 40 MG capsule Take 40 mg by mouth daily.    . eszopiclone (LUNESTA) 2 MG TABS tablet Take 1 tablet (2 mg total)  by mouth at bedtime. Take immediately before bedtime 30 tablet 2  . ferrous sulfate 325 (65 FE) MG tablet Take 1 tablet (325 mg total) by mouth 2 (two) times daily with a meal. 30 tablet 3  . fluticasone (FLONASE) 50 MCG/ACT nasal spray Place 2 sprays into both nostrils daily as needed for allergies.  (Patient not taking: Reported on 09/01/2020)    . Fluticasone-Umeclidin-Vilant 100-62.5-25 MCG/INH AEPB Inhale 1 puff into the lungs daily. Treligy    . gabapentin (NEURONTIN) 100 MG capsule Take 2 capsules in the morning 180 capsule 1  . gabapentin (NEURONTIN) 100 MG capsule Take 2 capsules (200 mg total) by mouth daily. QAM 180 capsule 1  . gabapentin (NEURONTIN) 400 MG capsule Take 1 capsule (400 mg total) by mouth 2 (two) times daily. Take 1 capsule at 1pm and take 1 capsule at 8pm 180 capsule 1  . ipratropium (ATROVENT) 0.02 % nebulizer solution Take 2.5 mLs (0.5 mg total) by nebulization every 6 (six) hours as needed for wheezing or shortness of breath. (Patient not taking: Reported on 09/01/2020) 75 mL 12  . metFORMIN (GLUCOPHAGE) 500 MG tablet Take 500 mg by mouth daily with breakfast.     . metoprolol tartrate (LOPRESSOR) 25 MG tablet Take 25 mg by mouth 2 (two) times daily.     . mirabegron ER (MYRBETRIQ) 25 MG TB24 tablet Take 1 tablet by mouth daily. (Patient not taking: Reported on 09/01/2020)    . Multiple Vitamin (MULTIVITAMIN) tablet Take 1 tablet by mouth daily. (Patient not taking: Reported on 09/01/2020)    . ondansetron (ZOFRAN) 8 MG tablet Take 1 tablet (8 mg total) by  mouth 2 (two) times daily. (Patient not taking: Reported on 09/01/2020) 60 tablet 0  . OXYGEN Inhale 2 L into the lungs continuous.    . potassium chloride (KLOR-CON) 8 MEQ tablet Take 8 mEq by mouth daily.     . pravastatin (PRAVACHOL) 80 MG tablet Take 80 mg by mouth every evening.     . prochlorperazine (COMPAZINE) 10 MG tablet Take 1 tablet (10 mg total) by mouth every 6 (six) hours as needed (Nausea or vomiting). (Patient not taking: Reported on 09/01/2020) 60 tablet 2  . rOPINIRole (REQUIP) 1 MG tablet Take 1 mg by mouth at bedtime. (Patient not taking: Reported on 09/01/2020)    . spironolactone (ALDACTONE) 25 MG tablet Take 25 mg by mouth daily.     Marland Kitchen venlafaxine XR (EFFEXOR-XR) 150 MG 24 hr capsule Take 1 capsule (150 mg total) by mouth daily with breakfast. 90 capsule 1   No current facility-administered medications for this visit.      Psychiatric Specialty Exam: ROS  There were no vitals taken for this visit.There is no height or weight on file to calculate BMI.  General Appearance: Unable to assess due to phone visit  Eye Contact: Unable to assess due to phone visit  Speech:  Clear and Coherent and Normal Rate  Volume:  Normal  Mood:  Euthymic  Affect:  Congruent  Thought Process:  Goal Directed, Linear and Descriptions of Associations: Intact  Orientation:  Full (Time, Place, and Person)  Thought Content: Logical   Suicidal Thoughts:  No  Homicidal Thoughts:  No  Memory:  Recent;   Good Remote;   Good  Judgement:  Good  Insight:  Good  Psychomotor Activity:  Normal  Concentration:  Concentration: Good and Attention Span: Good  Recall:  Good  Fund of Knowledge: Good  Language: Negative  Akathisia:  Negative  Handed:  Right  AIMS (if indicated): not done  Assets:  Communication Skills Desire for Improvement Financial Resources/Insurance Resilience Social Support  ADL's:  Intact  Cognition: WNL  Sleep:  Poor    Screenings: PHQ2-9   Flowsheet Row Patient  Outreach Telephone from 02/28/2017 in [place]  PHQ-2 Total Score 1       Assessment and Plan: Patient denies any ongoing depressive symptoms and reported some improvement in sleep after starting Lunesta.  We will continue the same regimen for now.  1. MDD (major depressive disorder), recurrent, in full remission ([place])  - venlafaxine XR (EFFEXOR-XR) 150 MG 24 hr capsule; Take 1 capsule (150 mg total) by mouth daily with breakfast.  Dispense: 90 capsule; Refill: 1 - gabapentin (NEURONTIN) 100 MG capsule; Take 2 capsules in the morning  Dispense: 180 capsule; Refill: 1 - gabapentin (NEURONTIN) 400 MG capsule; Take 1 capsule (400 mg total) by mouth 2 (two) times daily. Take 1 capsule at 1pm and take 1 capsule at 8pm  Dispense: 180 capsule; Refill: 1 - Continue Lunesta 2 mg at bedtime. Refill was sent on 09/26/20.  2. GAD (generalized anxiety disorder) - venlafaxine XR (EFFEXOR-XR) 150 MG 24 hr capsule; Take 1 capsule (150 mg total) by mouth daily with breakfast.  Dispense: 90 capsule; Refill: 1   Follow-up in 3 months.   Patient was advised to call sooner if needed.  [place] Crane, MD 09/30/2020, 11:16 AM

## 2020-12-03 IMAGING — CT CT CHEST W/O CM
2 of 4 series · 15 of 36 positions shown, 18 images · non-contrast
Comparison: Report of 11/28/2018 chest radiograph.

CLINICAL DATA: Worsening shortness of breath. Chest radiograph
demonstrating a possible rounded opacity in the anterior left lung.

EXAM:
CT CHEST WITHOUT CONTRAST
TECHNIQUE: Multidetector CT imaging of the chest was performed following the
standard protocol without IV contrast.

[Series 2: chest · axial · 0.61mm/px · z∈[-1248,-972]mm · 12 of 164 slices shown, 15 images (1 of 2)]
[im 13/164  mediastinal]
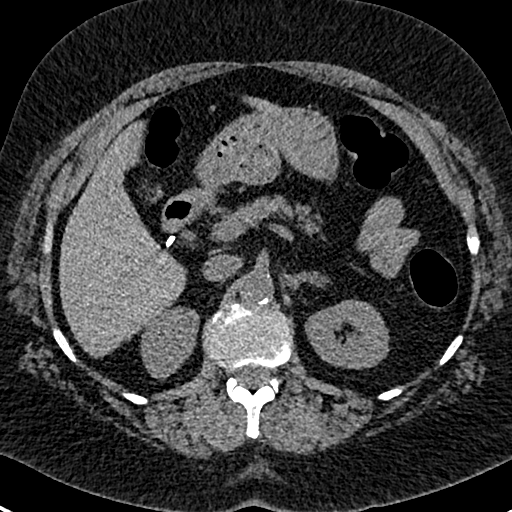
[im 13/164  lung]
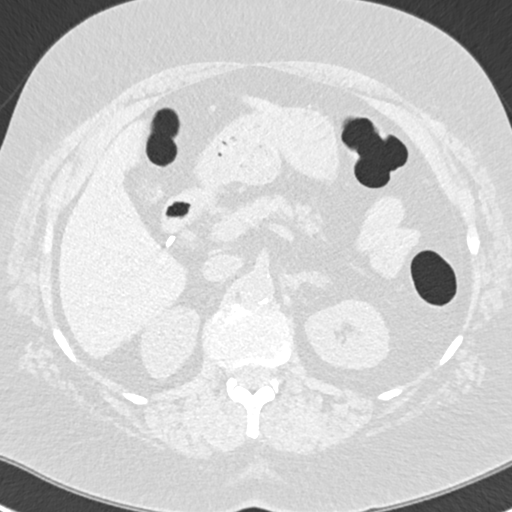
[im 26/164  lung]
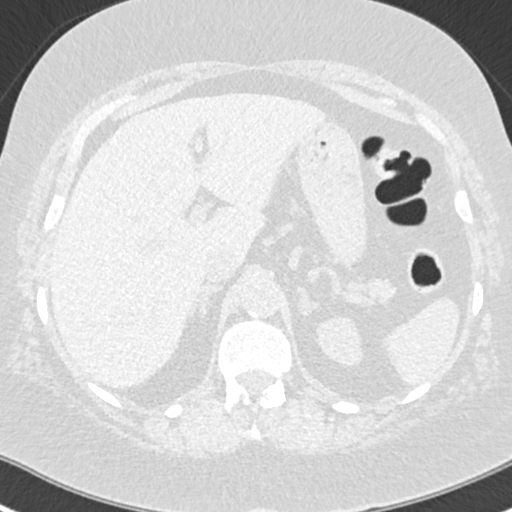
[im 38/164  lung]
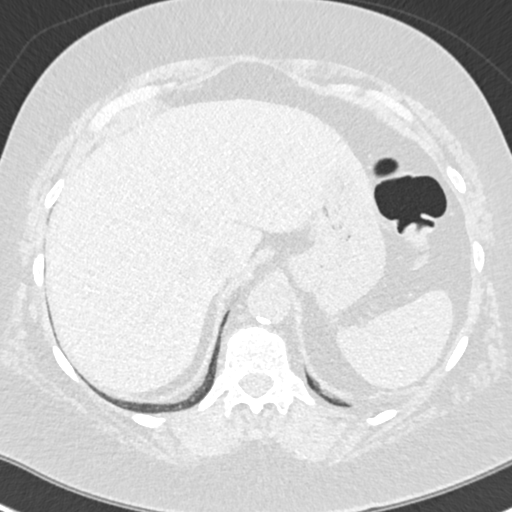
[im 51/164  lung]
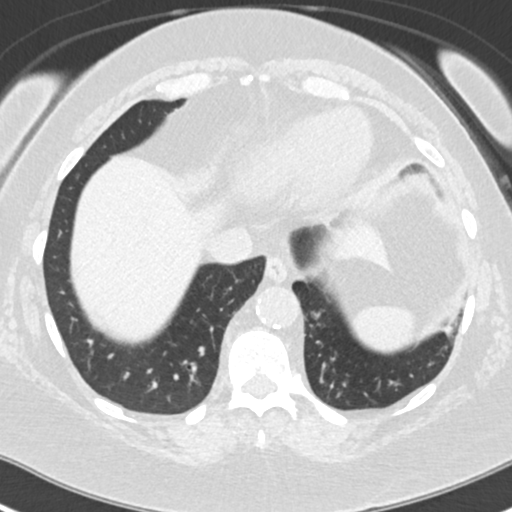
[im 63/164  mediastinal]
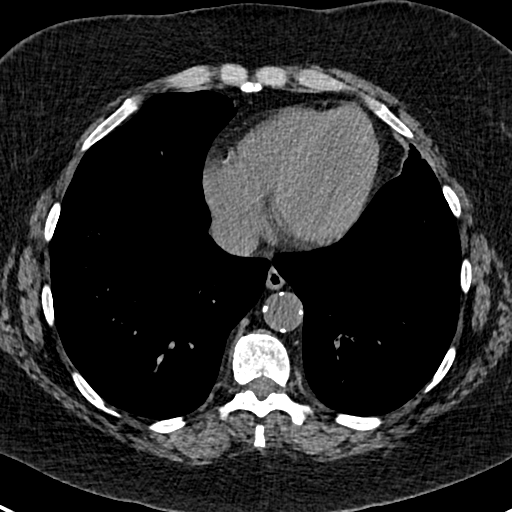
[im 63/164  lung]
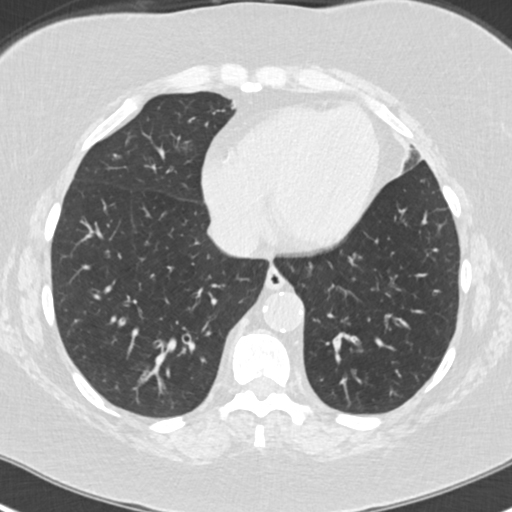
[im 76/164  lung]
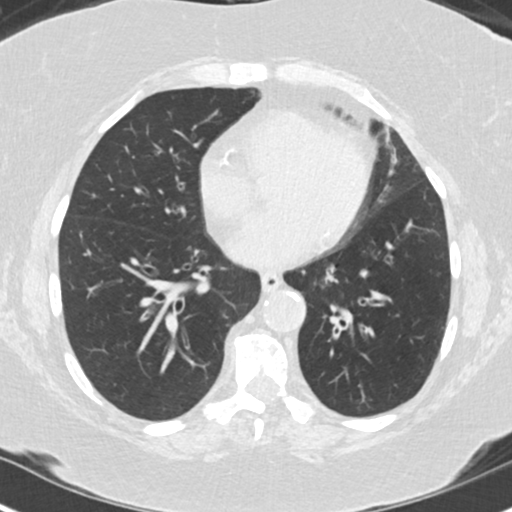
[im 88/164  lung]
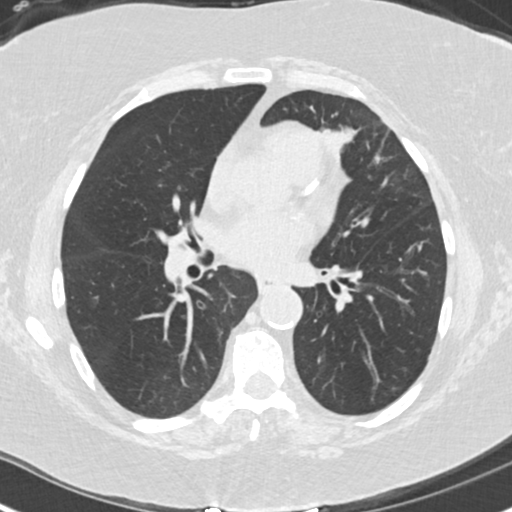
[im 101/164  lung]
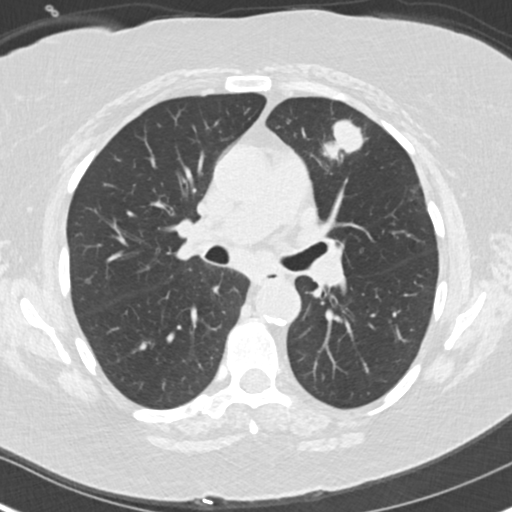
[im 113/164  mediastinal]
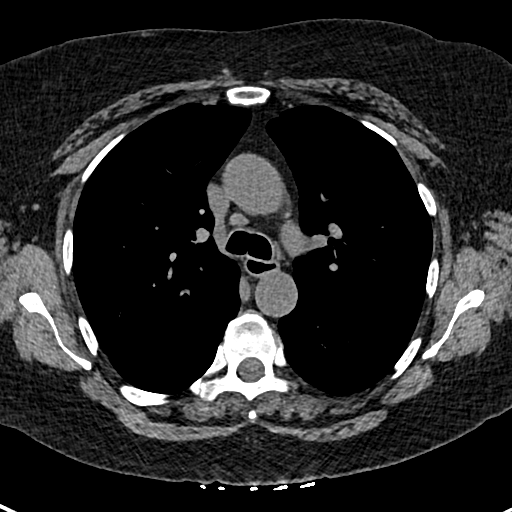
[im 113/164  lung]
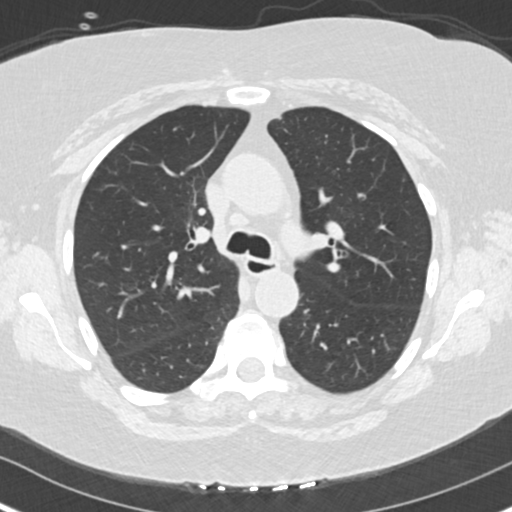
[im 126/164  lung]
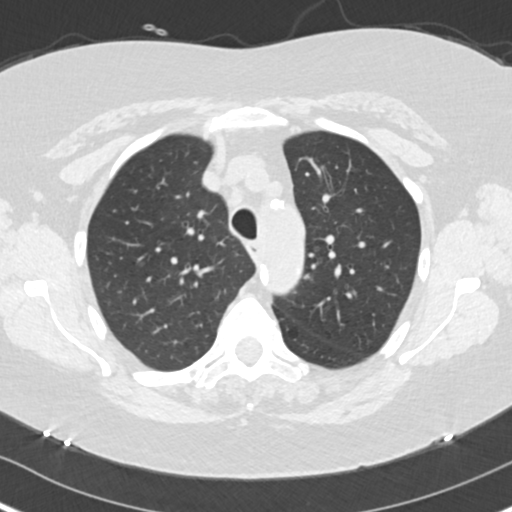
[im 138/164  lung]
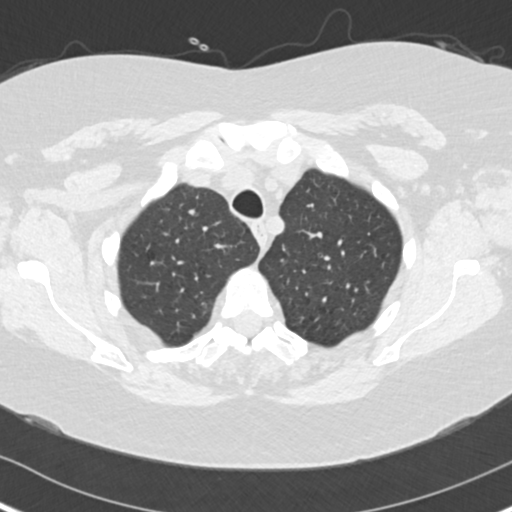
[im 151/164  lung]
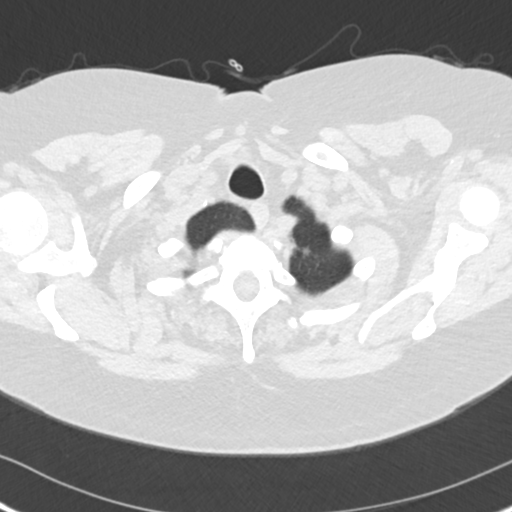

[Series 5: chest · coronal · 0.61mm/px · 3 of 157 slices shown (2 of 2)]
[im 32/157  lung]
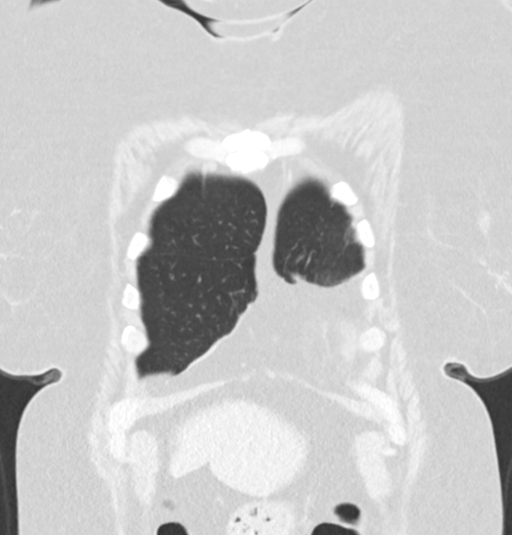
[im 63/157  lung]
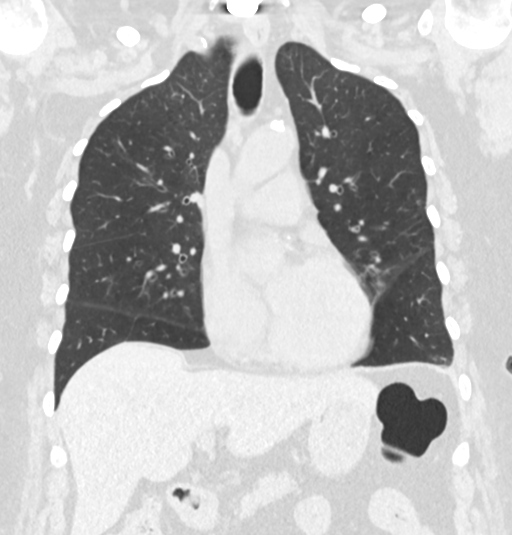
[im 94/157  lung]
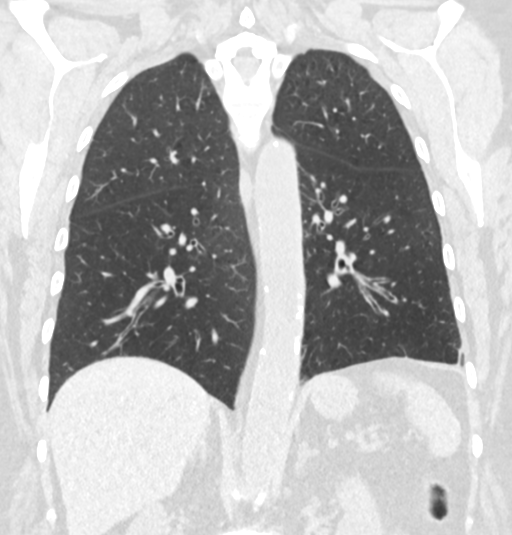

[15 of 36 positions shown; findings below may reference images not displayed]

Images not
available. Chest radiograph 02/24/2017 reviewed. Most recent CT
08/01/2012 reviewed.
FINDINGS: Cardiovascular: Aortic and branch vessel atherosclerosis. Normal
heart size, without pericardial effusion. Multivessel coronary
artery atherosclerosis.

Mediastinum/Nodes: Subcarinal node measures 1.9 x 2.3 cm on image
63/2. Other middle mediastinal nodes are not pathologic by size
criteria. Hilar regions poorly evaluated without intravenous
contrast.

Lungs/Pleura: No pleural fluid.  Mild centrilobular emphysema.

Mild areas of bibasilar mucoid impaction, primarily medially.
Apparent left apical nodule is favored to represent mucoid impaction
at 5 mm on image [DATE].

Macrolobulated, minimally centrally cavitary left upper lobe lung
mass measures 3.8 x 3.2 cm on image 69/3. 3.3 cm craniocaudal on
coronal image 42. Contacts the pleural surface both laterally and
medially, without gross chest wall or mediastinal invasion.

Upper Abdomen: Cholecystectomy. Normal imaged portions of the
spleen, stomach, pancreas, kidneys, right adrenal gland. Mild left
adrenal nodularity, likely due to small adenomas. Abdominal aortic
atherosclerosis.

Musculoskeletal: Lower cervical spine fixation. Left laminectomy at
the midthoracic spine.
IMPRESSION: 1. Left upper lobe lung mass, most consistent with primary
bronchogenic carcinoma.
2. Subcarinal adenopathy, highly suspicious for nodal metastasis.
3. Aortic atherosclerosis (5J7HR-[id number].S), coronary artery
atherosclerosis and emphysema ([id number]-[id number].D).

Consider multidisciplinary thoracic oncology consultation for
eventual PET and tissue sampling.

## 2020-12-22 ENCOUNTER — Telehealth (INDEPENDENT_AMBULATORY_CARE_PROVIDER_SITE_OTHER): Payer: Medicare HMO | Admitting: Psychiatry

## 2020-12-22 ENCOUNTER — Encounter (HOSPITAL_COMMUNITY): Payer: Self-pay | Admitting: Psychiatry

## 2020-12-22 ENCOUNTER — Other Ambulatory Visit: Payer: Self-pay

## 2020-12-22 DIAGNOSIS — F411 Generalized anxiety disorder: Secondary | ICD-10-CM

## 2020-12-22 DIAGNOSIS — G2581 Restless legs syndrome: Secondary | ICD-10-CM

## 2020-12-22 DIAGNOSIS — F3342 Major depressive disorder, recurrent, in full remission: Secondary | ICD-10-CM | POA: Diagnosis not present

## 2020-12-22 MED ORDER — GABAPENTIN 100 MG PO CAPS: 200.0000 mg | ORAL_CAPSULE | Freq: Every day | ORAL | 0 refills | Status: DC

## 2020-12-22 MED ORDER — ESZOPICLONE 3 MG PO TABS: 3.0000 mg | ORAL_TABLET | Freq: Every evening | ORAL | 1 refills | Status: DC | PRN

## 2020-12-22 MED ORDER — VENLAFAXINE HCL ER 150 MG PO CP24: 150.0000 mg | ORAL_CAPSULE | Freq: Every day | ORAL | 0 refills | Status: DC

## 2020-12-22 MED ORDER — GABAPENTIN 600 MG PO TABS: ORAL_TABLET | ORAL | 0 refills | Status: DC

## 2020-12-22 NOTE — Progress Notes (Signed)
[facility] MD OP Progress Note  Virtual Visit via Telephone Note  I connected with Suzanne Glenn on 12/22/20 at  2:00 PM EST by telephone and verified that I am speaking with the correct person using two identifiers.  Location: Patient: home Provider: Clinic   I discussed the limitations, risks, security and privacy concerns of performing an evaluation and management service by telephone and the availability of in person appointments. I also discussed with the patient that there may be a patient responsible charge related to this service. The patient expressed understanding and agreed to proceed.   I provided 16 minutes of non-face-to-face time during this encounter.    12/22/2020 2:11 PM Suzanne Glenn  MRN:  [id number]  Chief Complaint:  " My restlessness in my legs is worsening."  HPI: Patient reported that her depression symptoms have been in remission.  However she has noticed that the restlessness in her legs is worsening.  She informed that the gabapentin seems to wear off after 4 to 5 hours and she has a real hard time.  She has been taking 200 mg in the morning and then takes 400 mg tablet around 1 PM and then another 400 mg tablet at bedtime. She also reported that her sleeping medicine seems to not work on some nights and her sleep is not as consistent as it was. She was agreeable to adjusting the dose of gabapentin for optimal effects to target her restlessness in her legs and was agreeable to increase the dose of Lunesta for optimal sleep.  She has been taking iron supplement for iron deficiency anemia regularly. She informed that she is starting her radiation therapy sessions in May.  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   Visit Diagnosis:    ICD-10-CM   1. MDD (major depressive disorder), recurrent, in full remission ([place])  F33.42 venlafaxine XR (EFFEXOR-XR) 150 MG 24 hr capsule    Eszopiclone (ESZOPICLONE) 3 MG TABS  2.  GAD (generalized anxiety disorder)  F41.1 venlafaxine XR (EFFEXOR-XR) 150 MG 24 hr capsule  3. Restless leg syndrome  G25.81 gabapentin (NEURONTIN) 100 MG capsule    gabapentin (NEURONTIN) 600 MG tablet    Past Psychiatric History: Depression , anxiety  Past Medical History:  Past Medical History:  Diagnosis Date  . Anxiety   . Asthma   . Cancer ([place]) 12/2018   w/u for right upper lobe mass/cancer  . COPD (chronic obstructive pulmonary disease) ([facility])    also, emphysema. now using o2 via np 24 hours a day  . Coronary artery disease   . Depression   . Diabetes mellitus, type II ([place])   . GERD (gastroesophageal reflux disease)   . Headache    migraines in early 20's  . Heart murmur   . Hypertension   . Lung cancer ([place])   . Neuropathy   . Restless leg     Past Surgical History:  Procedure Laterality Date  . BACK SURGERY  2005   surgery x 2, disc fused in neck, pinched nerve in center of back and neck  . CARDIAC CATHETERIZATION  2015   1 stent placed for blockage  . cervical bone infusion    . CHOLECYSTECTOMY    . THORACIC DISC SURGERY    . TUBAL LIGATION    . VIDEO BRONCHOSCOPY WITH ENDOBRONCHIAL ULTRASOUND N/A 03/06/2019   Procedure: VIDEO BRONCHOSCOPY WITH ENDOBRONCHIAL ULTRASOUND - DIABETIC;  Surgeon: Ottie Glazier, MD;  Location: [facility] ORS;  Service: Thoracic;  Laterality: N/A;    Family Psychiatric History: See below  Family History:  Family History  Problem Relation Age of Onset  . Anxiety disorder Mother   . Cancer - Lung Mother   . Depression Sister   . Cancer Sister   . Depression Brother   . Cancer Sister   . Cancer Sister   . Cancer Sister   . Heart Problems Sister   . Bipolar disorder Sister   . Diabetes Sister   . Cancer - Lung Sister   . Hypertension Sister   . Diabetes Sister   . Hyperlipidemia Sister   . COPD Sister   . Heart Problems Brother   . Cancer Brother   . Cancer Brother   . Cancer Brother   . Breast cancer Maternal Aunt      Social History:  Social History   Socioeconomic History  . Marital status: Married    Spouse name: Suzanne Glenn  . Number of children: Not on file  . Years of education: Not on file  . Highest education level: Not on file  Occupational History    Comment: disabled  Tobacco Use  . Smoking status: Current Every Day Smoker    Packs/day: 1.00    Years: 40.00    Pack years: 40.00    Types: Cigarettes    Start date: 05/05/1975  . Smokeless tobacco: Never Used  Vaping Use  . Vaping Use: Never used  Substance and Sexual Activity  . Alcohol use: No    Alcohol/week: 0.0 standard drinks    Comment: socially  . Drug use: No  . Sexual activity: Not Currently  Other Topics Concern  . Not on file  Social History Narrative  . Not on file   Social Determinants of Health   Financial Resource Strain: Not on file  Food Insecurity: Not on file  Transportation Needs: Not on file  Physical Activity: Not on file  Stress: Not on file  Social Connections: Not on file    Allergies:  Allergies  Allergen Reactions  . Diclofenac-Misoprostol Other (See Comments)    Arthrotec - gastritis   . Nsaids Other (See Comments)    gastritis  . Zolpidem Other (See Comments)    Sleep walking   . Hydrocodone-Acetaminophen Nausea And Vomiting  . Simvastatin Other (See Comments)    body aches    Metabolic Disorder Labs: Lab Results  Component Value Date   HGBA1C 6.4 (H) 01/02/2019   MPG 136.98 01/02/2019   No results found for: PROLACTIN No results found for: CHOL, TRIG, HDL, CHOLHDL, VLDL, LDLCALC No results found for: TSH  Therapeutic Level Labs: No results found for: LITHIUM No results found for: VALPROATE No components found for:  CBMZ  Current Medications: Current Outpatient Medications  Medication Sig Dispense Refill  . Eszopiclone (ESZOPICLONE) 3 MG TABS Take 1 tablet (3 mg total) by mouth at bedtime as needed. Take immediately before bedtime 30 tablet 1  . gabapentin (NEURONTIN)  600 MG tablet Take one tablet at 1 pm and 1 tablet at 8 pm 180 tablet 0  . ACCU-CHEK FASTCLIX LANCETS MISC     . ACCU-CHEK SMARTVIEW test strip     . albuterol (PROVENTIL HFA) 108 (90 BASE) MCG/ACT inhaler Inhale 2 puffs into the lungs every 4 (four) hours as needed for wheezing or shortness of breath.     Marland Kitchen albuterol (PROVENTIL) (2.5 MG/3ML) 0.083% nebulizer solution Take 2.5 mg by nebulization every 6 (six) hours as needed for wheezing  or shortness of breath. (Patient not taking: Reported on 09/01/2020)    . amLODipine (NORVASC) 10 MG tablet Take 10 mg by mouth daily.     Marland Kitchen aspirin 81 MG chewable tablet Chew 1 tablet (81 mg total) by mouth daily. Continue holding now, as you are doing until procedure. (Patient taking differently: Chew 81 mg by mouth daily. ) 30 tablet 0  . carbidopa-levodopa (SINEMET IR) 25-100 MG tablet Take 1 tablet by mouth 3 (three) times daily.    . diclofenac sodium (VOLTAREN) 1 % GEL  (Patient not taking: Reported on 09/01/2020)    . esomeprazole (NEXIUM) 40 MG capsule Take 40 mg by mouth daily.    . ferrous sulfate 325 (65 FE) MG tablet Take 1 tablet (325 mg total) by mouth 2 (two) times daily with a meal. 30 tablet 3  . fluticasone (FLONASE) 50 MCG/ACT nasal spray Place 2 sprays into both nostrils daily as needed for allergies.  (Patient not taking: Reported on 09/01/2020)    . Fluticasone-Umeclidin-Vilant 100-62.5-25 MCG/INH AEPB Inhale 1 puff into the lungs daily. [place]    . gabapentin (NEURONTIN) 100 MG capsule Take 2 capsules in the morning 180 capsule 1  . gabapentin (NEURONTIN) 100 MG capsule Take 2 capsules (200 mg total) by mouth daily. QAM 180 capsule 0  . ipratropium (ATROVENT) 0.02 % nebulizer solution Take 2.5 mLs (0.5 mg total) by nebulization every 6 (six) hours as needed for wheezing or shortness of breath. (Patient not taking: Reported on 09/01/2020) 75 mL 12  . metFORMIN (GLUCOPHAGE) 500 MG tablet Take 500 mg by mouth daily with breakfast.     .  metoprolol tartrate (LOPRESSOR) 25 MG tablet Take 25 mg by mouth 2 (two) times daily.     . mirabegron ER (MYRBETRIQ) 25 MG TB24 tablet Take 1 tablet by mouth daily. (Patient not taking: Reported on 09/01/2020)    . Multiple Vitamin (MULTIVITAMIN) tablet Take 1 tablet by mouth daily. (Patient not taking: Reported on 09/01/2020)    . OXYGEN Inhale 2 L into the lungs continuous.    . potassium chloride (KLOR-CON) 8 MEQ tablet Take 8 mEq by mouth daily.     . pravastatin (PRAVACHOL) 80 MG tablet Take 80 mg by mouth every evening.     Marland Kitchen rOPINIRole (REQUIP) 1 MG tablet Take 1 mg by mouth at bedtime. (Patient not taking: Reported on 09/01/2020)    . spironolactone (ALDACTONE) 25 MG tablet Take 25 mg by mouth daily.     Marland Kitchen venlafaxine XR (EFFEXOR-XR) 150 MG 24 hr capsule Take 1 capsule (150 mg total) by mouth daily with breakfast. 90 capsule 0   No current facility-administered medications for this visit.      Psychiatric Specialty Exam: ROS  There were no vitals taken for this visit.There is no height or weight on file to calculate BMI.  General Appearance: Unable to assess due to phone visit  Eye Contact: Unable to assess due to phone visit  Speech:  Clear and Coherent and Normal Rate  Volume:  Normal  Mood:  Euthymic  Affect:  Congruent  Thought Process:  Goal Directed, Linear and Descriptions of Associations: Intact  Orientation:  Full (Time, Place, and Person)  Thought Content: Logical   Suicidal Thoughts:  No  Homicidal Thoughts:  No  Memory:  Recent;   Good Remote;   Good  Judgement:  Good  Insight:  Good  Psychomotor Activity:  Normal  Concentration:  Concentration: Good and Attention Span: Good  Recall:  Good  Fund of Knowledge: Good  Language: Negative  Akathisia:  Negative  Handed:  Right  AIMS (if indicated): not done  Assets:  Communication Skills Desire for Improvement Financial Resources/Insurance Resilience Social Support  ADL's:  Intact  Cognition: WNL  Sleep:   Fair    Screenings: PHQ2-9   Flowsheet Row Video Visit from 12/22/2020 in [facility] Patient Outreach Telephone from 02/28/2017 in [facility]  PHQ-2 Total Score 0 1    [place] Video Visit from 12/22/2020 in [facility] No Risk       Assessment and Plan: Patient is currently undergoing treatment for squamous cell carcinoma and is seeing the oncology specialists for the same.  She complained of noticing increased restlessness in her legs and believes that gabapentin wears off pretty soon during the day.  She also complained of not being able to sleep well every night.  She was agreeable to adjusting the doses of gabapentin and Lunesta for the same.   1. MDD (major depressive disorder), recurrent, in full remission ([place])  - venlafaxine XR (EFFEXOR-XR) 150 MG 24 hr capsule; Take 1 capsule (150 mg total) by mouth daily with breakfast.  Dispense: 90 capsule; Refill: 0 - Increase Eszopiclone (ESZOPICLONE) 3 MG TABS; Take 1 tablet (3 mg total) by mouth at bedtime as needed. Take immediately before bedtime  Dispense: 30 tablet; Refill: 1  2. GAD (generalized anxiety disorder)  - venlafaxine XR (EFFEXOR-XR) 150 MG 24 hr capsule; Take 1 capsule (150 mg total) by mouth daily with breakfast.  Dispense: 90 capsule; Refill: 0  3. Restless leg syndrome  - gabapentin (NEURONTIN) 100 MG capsule; Take 2 capsules (200 mg total) by mouth daily. QAM  Dispense: 180 capsule; Refill: 0 - Increase gabapentin (NEURONTIN) 600 MG tablet; Take one tablet at 1 pm and 1 tablet at 8 pm  Dispense: 180 tablet; Refill: 0  Follow-up in 2 months.   Patient was advised to call sooner if needed.  [place] Crane, MD 12/22/2020, 2:11 PM

## 2021-01-09 DIAGNOSIS — C3492 Malignant neoplasm of unspecified part of left bronchus or lung: Secondary | ICD-10-CM | POA: Diagnosis not present

## 2021-01-09 DIAGNOSIS — Z0000 Encounter for general adult medical examination without abnormal findings: Secondary | ICD-10-CM | POA: Diagnosis not present

## 2021-01-09 DIAGNOSIS — M25532 Pain in left wrist: Secondary | ICD-10-CM | POA: Diagnosis not present

## 2021-01-09 DIAGNOSIS — E1165 Type 2 diabetes mellitus with hyperglycemia: Secondary | ICD-10-CM | POA: Diagnosis not present

## 2021-01-09 DIAGNOSIS — E538 Deficiency of other specified B group vitamins: Secondary | ICD-10-CM | POA: Diagnosis not present

## 2021-01-09 DIAGNOSIS — F3341 Major depressive disorder, recurrent, in partial remission: Secondary | ICD-10-CM | POA: Diagnosis not present

## 2021-01-09 DIAGNOSIS — I2510 Atherosclerotic heart disease of native coronary artery without angina pectoris: Secondary | ICD-10-CM | POA: Diagnosis not present

## 2021-01-09 DIAGNOSIS — Z1159 Encounter for screening for other viral diseases: Secondary | ICD-10-CM | POA: Diagnosis not present

## 2021-01-09 DIAGNOSIS — Z720 Tobacco use: Secondary | ICD-10-CM | POA: Diagnosis not present

## 2021-01-16 DIAGNOSIS — C3492 Malignant neoplasm of unspecified part of left bronchus or lung: Secondary | ICD-10-CM | POA: Diagnosis not present

## 2021-01-16 DIAGNOSIS — I2510 Atherosclerotic heart disease of native coronary artery without angina pectoris: Secondary | ICD-10-CM | POA: Diagnosis not present

## 2021-01-16 DIAGNOSIS — J449 Chronic obstructive pulmonary disease, unspecified: Secondary | ICD-10-CM | POA: Diagnosis not present

## 2021-01-16 DIAGNOSIS — E119 Type 2 diabetes mellitus without complications: Secondary | ICD-10-CM | POA: Diagnosis not present

## 2021-01-16 DIAGNOSIS — Z0000 Encounter for general adult medical examination without abnormal findings: Secondary | ICD-10-CM | POA: Diagnosis not present

## 2021-01-16 DIAGNOSIS — F324 Major depressive disorder, single episode, in partial remission: Secondary | ICD-10-CM | POA: Diagnosis not present

## 2021-01-16 DIAGNOSIS — L989 Disorder of the skin and subcutaneous tissue, unspecified: Secondary | ICD-10-CM | POA: Diagnosis not present

## 2021-01-16 DIAGNOSIS — K219 Gastro-esophageal reflux disease without esophagitis: Secondary | ICD-10-CM | POA: Diagnosis not present

## 2021-01-16 DIAGNOSIS — F17210 Nicotine dependence, cigarettes, uncomplicated: Secondary | ICD-10-CM | POA: Diagnosis not present

## 2021-01-16 DIAGNOSIS — I10 Essential (primary) hypertension: Secondary | ICD-10-CM | POA: Diagnosis not present

## 2021-01-16 DIAGNOSIS — J9611 Chronic respiratory failure with hypoxia: Secondary | ICD-10-CM | POA: Diagnosis not present

## 2021-01-18 ENCOUNTER — Telehealth (HOSPITAL_COMMUNITY): Payer: Self-pay

## 2021-01-18 NOTE — Telephone Encounter (Signed)
Yes, you are right. Her dose of Gabapentin was adjusted to Gabapentin 100mg  2 capsules QAM (200mg  total) and Gabapentin 600mg  1 tablet at 1pm & 1 tablet QHS in March last month. See, if the pharmacy has received that RX sent on 12/22/20.

## 2021-01-18 NOTE — Telephone Encounter (Signed)
Patient called regarding her Gabapentin. I spoke with pt and the pharmacy but am unable to get clarification. The pharmacist stated that pt picked up Gabapentin 400mg  on 2/18 #180 and pt stated that she's taking her 400mg  & has been supplementing with the 100mg  so she's almost out of that dosage (100mg ) and can't be filled again until May. She also picked up #180 of the 100mg  on 2/18 as well. According to pharmist, she has not picked up the 600mg  yet. When looking at the notes from the last visit, it looks like patient should be taking Gabapentin 100mg  2 capsules QAM (200mg  total) and Gabapentin 600mg  1 tablet at 1pm & 1 tablet QHS. Please clarify, review and advise. Thank you

## 2021-02-13 ENCOUNTER — Ambulatory Visit: Payer: Medicare HMO | Admitting: Radiation Oncology

## 2021-02-20 ENCOUNTER — Encounter (HOSPITAL_COMMUNITY): Payer: Self-pay | Admitting: Psychiatry

## 2021-02-20 ENCOUNTER — Telehealth (INDEPENDENT_AMBULATORY_CARE_PROVIDER_SITE_OTHER): Payer: Medicare HMO | Admitting: Psychiatry

## 2021-02-20 ENCOUNTER — Other Ambulatory Visit: Payer: Self-pay

## 2021-02-20 ENCOUNTER — Telehealth (HOSPITAL_COMMUNITY): Payer: Self-pay | Admitting: *Deleted

## 2021-02-20 DIAGNOSIS — F411 Generalized anxiety disorder: Secondary | ICD-10-CM

## 2021-02-20 DIAGNOSIS — F3342 Major depressive disorder, recurrent, in full remission: Secondary | ICD-10-CM

## 2021-02-20 DIAGNOSIS — G2581 Restless legs syndrome: Secondary | ICD-10-CM

## 2021-02-20 MED ORDER — ESZOPICLONE 3 MG PO TABS: 3.0000 mg | ORAL_TABLET | Freq: Every evening | ORAL | 2 refills | Status: DC | PRN

## 2021-02-20 MED ORDER — GABAPENTIN 600 MG PO TABS: 600.0000 mg | ORAL_TABLET | Freq: Three times a day (TID) | ORAL | 0 refills | Status: DC

## 2021-02-20 MED ORDER — VENLAFAXINE HCL ER 150 MG PO CP24: 150.0000 mg | ORAL_CAPSULE | Freq: Every day | ORAL | 0 refills | Status: DC

## 2021-02-20 NOTE — Telephone Encounter (Signed)
Submitted a PA for patients Lunesta with [employer]. Wait period for a determination is 24 hours. Will follow up with it tomorrow and or Wed.

## 2021-02-20 NOTE — Progress Notes (Signed)
[facility] OP Progress Note  Virtual Visit via Telephone Note  I connected with Suzanne Glenn on 02/20/21 at  2:30 PM EDT by telephone and verified that I am speaking with the correct person using two identifiers.  Location: Patient: home Provider: Clinic   I discussed the limitations, risks, security and privacy concerns of performing an evaluation and management service by telephone and the availability of in person appointments. I also discussed with the patient that there may be a patient responsible charge related to this service. The patient expressed understanding and agreed to proceed.   I provided 15 minutes of non-face-to-face time during this encounter.    02/20/2021 2:39 PM DANNICA BICKHAM  MRN:  [id number]  Chief Complaint:  " Higher dose of Gabapentin has helped."  HPI: Patient reported that she noticed some improvement in the restlessness of her legs after her dose of gabapentin was increased however her restlessness in her legs still wakes her up around 4:30 in the morning. She also reported significant improvement in her sleep after the dose of Lunesta was increased.  She stated that she can fall asleep almost immediately after she goes to bed. She stated that she still takes 2 tablets of 1 mg gabapentin in the morning and then she takes one 600 mg capsule in the afternoon and one 600 mg in the evening. She stated that there was some confusion in the pharmacy as there was delay in filling her prescriptions and therefore she was taking some different dose of gabapentin in between. Writer asked her if she would like to simplify her regimen and just adjust the dose to 600 mg 3 times a day for optimal effects, patient stated that would be a good idea. She is agreeable to continue Lunesta and venlafaxine at the same doses.  Visit Diagnosis:    ICD-10-CM   1. MDD (major depressive disorder), recurrent, in full remission ([place])  F33.42 Eszopiclone (ESZOPICLONE) 3 MG TABS     venlafaxine XR (EFFEXOR-XR) 150 MG 24 hr capsule  2. GAD (generalized anxiety disorder)  F41.1 venlafaxine XR (EFFEXOR-XR) 150 MG 24 hr capsule  3. Restless leg syndrome  G25.81 gabapentin (NEURONTIN) 600 MG tablet    Past Psychiatric History: Depression , anxiety  Past Medical History:  Past Medical History:  Diagnosis Date  . Anxiety   . Asthma   . Cancer ([place]) 12/2018   w/u for right upper lobe mass/cancer  . COPD (chronic obstructive pulmonary disease) (HCC)    also, emphysema. now using o2 via np 24 hours a day  . Coronary artery disease   . Depression   . Diabetes mellitus, type II ([place])   . GERD (gastroesophageal reflux disease)   . Headache    migraines in early 20's  . Heart murmur   . Hypertension   . Lung cancer ([place])   . Neuropathy   . Restless leg     Past Surgical History:  Procedure Laterality Date  . BACK SURGERY  2005   surgery x 2, disc fused in neck, pinched nerve in center of back and neck  . CARDIAC CATHETERIZATION  2015   1 stent placed for blockage  . cervical bone infusion    . CHOLECYSTECTOMY    . THORACIC DISC SURGERY    . TUBAL LIGATION    . VIDEO BRONCHOSCOPY WITH ENDOBRONCHIAL ULTRASOUND N/A 03/06/2019   Procedure: VIDEO BRONCHOSCOPY WITH ENDOBRONCHIAL ULTRASOUND - DIABETIC;  Surgeon: Ottie Glazier, MD;  Location: [facility] ORS;  Service:  Thoracic;  Laterality: N/A;    Family Psychiatric History: See below  Family History:  Family History  Problem Relation Age of Onset  . Anxiety disorder Mother   . Cancer - Lung Mother   . Depression Sister   . Cancer Sister   . Depression Brother   . Cancer Sister   . Cancer Sister   . Cancer Sister   . Heart Problems Sister   . Bipolar disorder Sister   . Diabetes Sister   . Cancer - Lung Sister   . Hypertension Sister   . Diabetes Sister   . Hyperlipidemia Sister   . COPD Sister   . Heart Problems Brother   . Cancer Brother   . Cancer Brother   . Cancer Brother   . Breast cancer Maternal  Aunt     Social History:  Social History   Socioeconomic History  . Marital status: Married    Spouse name: Abe People  . Number of children: Not on file  . Years of education: Not on file  . Highest education level: Not on file  Occupational History    Comment: disabled  Tobacco Use  . Smoking status: Current Every Day Smoker    Packs/day: 1.00    Years: 40.00    Pack years: 40.00    Types: Cigarettes    Start date: 05/05/1975  . Smokeless tobacco: Never Used  Vaping Use  . Vaping Use: Never used  Substance and Sexual Activity  . Alcohol use: No    Alcohol/week: 0.0 standard drinks    Comment: socially  . Drug use: No  . Sexual activity: Not Currently  Other Topics Concern  . Not on file  Social History Narrative  . Not on file   Social Determinants of Health   Financial Resource Strain: Not on file  Food Insecurity: Not on file  Transportation Needs: Not on file  Physical Activity: Not on file  Stress: Not on file  Social Connections: Not on file    Allergies:  Allergies  Allergen Reactions  . Diclofenac-Misoprostol Other (See Comments)    Arthrotec - gastritis   . Nsaids Other (See Comments)    gastritis  . Zolpidem Other (See Comments)    Sleep walking   . Hydrocodone-Acetaminophen Nausea And Vomiting  . Simvastatin Other (See Comments)    body aches    Metabolic Disorder Labs: Lab Results  Component Value Date   HGBA1C 6.4 (H) 01/02/2019   MPG 136.98 01/02/2019   No results found for: PROLACTIN No results found for: CHOL, TRIG, HDL, CHOLHDL, VLDL, LDLCALC No results found for: TSH  Therapeutic Level Labs: No results found for: LITHIUM No results found for: VALPROATE No components found for:  CBMZ  Current Medications: Current Outpatient Medications  Medication Sig Dispense Refill  . ACCU-CHEK FASTCLIX LANCETS MISC     . ACCU-CHEK SMARTVIEW test strip     . albuterol (PROVENTIL HFA) 108 (90 BASE) MCG/ACT inhaler Inhale 2 puffs into the  lungs every 4 (four) hours as needed for wheezing or shortness of breath.     Marland Kitchen albuterol (PROVENTIL) (2.5 MG/3ML) 0.083% nebulizer solution Take 2.5 mg by nebulization every 6 (six) hours as needed for wheezing or shortness of breath. (Patient not taking: Reported on 09/01/2020)    . amLODipine (NORVASC) 10 MG tablet Take 10 mg by mouth daily.     Marland Kitchen aspirin 81 MG chewable tablet Chew 1 tablet (81 mg total) by mouth daily. Continue holding now, as you  are doing until procedure. (Patient taking differently: Chew 81 mg by mouth daily. ) 30 tablet 0  . carbidopa-levodopa (SINEMET IR) 25-100 MG tablet Take 1 tablet by mouth 3 (three) times daily.    . diclofenac sodium (VOLTAREN) 1 % GEL  (Patient not taking: Reported on 09/01/2020)    . esomeprazole (NEXIUM) 40 MG capsule Take 40 mg by mouth daily.    . Eszopiclone (ESZOPICLONE) 3 MG TABS Take 1 tablet (3 mg total) by mouth at bedtime as needed. Take immediately before bedtime 30 tablet 2  . ferrous sulfate 325 (65 FE) MG tablet Take 1 tablet (325 mg total) by mouth 2 (two) times daily with a meal. 30 tablet 3  . fluticasone (FLONASE) 50 MCG/ACT nasal spray Place 2 sprays into both nostrils daily as needed for allergies.  (Patient not taking: Reported on 09/01/2020)    . Fluticasone-Umeclidin-Vilant 100-62.5-25 MCG/INH AEPB Inhale 1 puff into the lungs daily. Treligy    . gabapentin (NEURONTIN) 600 MG tablet Take 1 tablet (600 mg total) by mouth 3 (three) times daily. 180 tablet 0  . ipratropium (ATROVENT) 0.02 % nebulizer solution Take 2.5 mLs (0.5 mg total) by nebulization every 6 (six) hours as needed for wheezing or shortness of breath. (Patient not taking: Reported on 09/01/2020) 75 mL 12  . metFORMIN (GLUCOPHAGE) 500 MG tablet Take 500 mg by mouth daily with breakfast.     . metoprolol tartrate (LOPRESSOR) 25 MG tablet Take 25 mg by mouth 2 (two) times daily.     . mirabegron ER (MYRBETRIQ) 25 MG TB24 tablet Take 1 tablet by mouth daily. (Patient  not taking: Reported on 09/01/2020)    . Multiple Vitamin (MULTIVITAMIN) tablet Take 1 tablet by mouth daily. (Patient not taking: Reported on 09/01/2020)    . OXYGEN Inhale 2 L into the lungs continuous.    . potassium chloride (KLOR-CON) 8 MEQ tablet Take 8 mEq by mouth daily.     . pravastatin (PRAVACHOL) 80 MG tablet Take 80 mg by mouth every evening.     Marland Kitchen spironolactone (ALDACTONE) 25 MG tablet Take 25 mg by mouth daily.     Marland Kitchen venlafaxine XR (EFFEXOR-XR) 150 MG 24 hr capsule Take 1 capsule (150 mg total) by mouth daily with breakfast. 90 capsule 0   No current facility-administered medications for this visit.      Psychiatric Specialty Exam: ROS  There were no vitals taken for this visit.There is no height or weight on file to calculate BMI.  General Appearance: Unable to assess due to phone visit  Eye Contact: Unable to assess due to phone visit  Speech:  Clear and Coherent and Normal Rate  Volume:  Normal  Mood:  Euthymic  Affect:  Congruent  Thought Process:  Goal Directed, Linear and Descriptions of Associations: Intact  Orientation:  Full (Time, Place, and Person)  Thought Content: Logical   Suicidal Thoughts:  No  Homicidal Thoughts:  No  Memory:  Recent;   Good Remote;   Good  Judgement:  Good  Insight:  Good  Psychomotor Activity:  Normal  Concentration:  Concentration: Good and Attention Span: Good  Recall:  Good  Fund of Knowledge: Good  Language: Negative  Akathisia:  Negative  Handed:  Right  AIMS (if indicated): not done  Assets:  Communication Skills Desire for Improvement Financial Resources/Insurance Resilience Social Support  ADL's:  Intact  Cognition: WNL  Sleep:  Fair    Screenings: PHQ2-9   Flowsheet Row Video Visit from  12/22/2020 in [facility] Patient Outreach Telephone from 02/28/2017 in [facility]  PHQ-2 Total Score 0 1    Flowsheet Row Video Visit from 12/22/2020 in [facility] No Risk       Assessment and Plan: Patient reported improvement in her sleep after Lunesta dose was increased and some improvement in restlessness in her legs but not complete improvement after dose of gabapentin was adjusted.  She is agreeable to going up on the dose of gabapentin for optimal effects.   1. MDD (major depressive disorder), recurrent, in full remission ([place])  - Eszopiclone (ESZOPICLONE) 3 MG TABS; Take 1 tablet (3 mg total) by mouth at bedtime as needed. Take immediately before bedtime  Dispense: 30 tablet; Refill: 2 - venlafaxine XR (EFFEXOR-XR) 150 MG 24 hr capsule; Take 1 capsule (150 mg total) by mouth daily with breakfast.  Dispense: 90 capsule; Refill: 0  2. GAD (generalized anxiety disorder)  - venlafaxine XR (EFFEXOR-XR) 150 MG 24 hr capsule; Take 1 capsule (150 mg total) by mouth daily with breakfast.  Dispense: 90 capsule; Refill: 0  3. Restless leg syndrome  - Increase gabapentin (NEURONTIN) 600 MG tablet; Take 1 tablet (600 mg total) by mouth 3 (three) times daily.  Dispense: 180 tablet; Refill: 0  Follow-up in 3 months.   Patient was informed that writer is leaving the office and therefore her care is being transferred to a different provider in the [facility] psychiatry clinic.  Patient verbalized her understanding.  [place]evada Crane, MD 02/20/2021, 2:39 PM

## 2021-02-21 ENCOUNTER — Telehealth (HOSPITAL_COMMUNITY): Payer: Self-pay | Admitting: *Deleted

## 2021-02-21 NOTE — Telephone Encounter (Signed)
Prior authorization provided for patients Eszopiclone 3 mg tab. This authorization is good till 10/14/21, a PA number was not provided thru [facility] . Pharmacy notified and it was run thru successfully.

## 2021-02-25 NOTE — Progress Notes (Signed)
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  INTERVAL HISTORY: Patient returns to clinic today for routine 54-monthevaluation and discussion of her imaging results.  She continues to feel well and remains asymptomatic.  She has chronic shortness of breath and requires oxygen 24 hours a day. She has no neurologic complaints.  She denies any recent fevers or illnesses.  She has a good appetite and denies weight loss.  She denies any pain.  She has no chest pain, cough, or hemoptysis.  She denies any nausea, vomiting, constipation, or diarrhea.  She has no urinary complaints.  Patient feels at her baseline offers no specific complaints today.  REVIEW OF SYSTEMS:   Review of Systems  Constitutional: Negative.  Negative for fever, malaise/fatigue and weight loss.  Respiratory: Positive for shortness of breath. Negative for cough and hemoptysis.   Cardiovascular: Negative.  Negative for chest pain and leg swelling.  Gastrointestinal: Negative.  Negative for abdominal pain.  Genitourinary: Negative.  Negative for dysuria.  Musculoskeletal: Negative.  Negative for back pain.  Skin: Negative.  Negative for rash.  Neurological: Negative.  Negative for dizziness, weakness and headaches.  Psychiatric/Behavioral: Negative.  The patient is not nervous/anxious.     As per HPI. Otherwise, a complete review of systems is negative.  PAST MEDICAL HISTORY: Past Medical History:  Diagnosis Date  . Anxiety   . [place]   [place]   [place]/u for right upper lobe mass/cancer  . COPD (chronic  obstructive pulmonary disease) (HCC)    also, emphysema. now using o2 via np 24 hours a day  . Coronary artery disease   . Depression   . Diabetes mellitus, type II ([place]   . GERD (gastroesophageal reflux disease)   . Headache    migraines in early 20's  . Heart murmur   . Hypertension   . [place] cancer [place]   . Neuropathy   . Restless leg     PAST SURGICAL HISTORY: Past Surgical History:  Procedure Laterality Date  . BACK SURGERY  2005   surgery x 2, disc fused in neck, pinched nerve in center of back and neck  . CARDIAC CATHETERIZATION  2015   1 stent placed for blockage  . cervical bone infusion    . CHOLECYSTECTOMY    . THORACIC DISC SURGERY    . TUBAL LIGATION    . VIDEO BRONCHOSCOPY WITH ENDOBRONCHIAL ULTRASOUND N/A 03/06/2019   Procedure: VIDEO BRONCHOSCOPY WITH ENDOBRONCHIAL ULTRASOUND - DIABETIC;  Surgeon: AOttie Glazier MD;  Location: [facility] ORS;  Service: Thoracic;  Laterality: N/A;    FAMILY HISTORY: Family History  Problem Relation Age of Onset  . Anxiety disorder Mother   . Cancer - Lung Mother   . Depression Sister   . Cancer Sister   . Depression Brother   . Cancer Sister   . Cancer Sister   . Cancer Sister   . Heart Problems Sister   . Bipolar disorder Sister   . Diabetes Sister   . Cancer - Lung Sister   . Hypertension Sister   . Diabetes Sister   . Hyperlipidemia Sister   . COPD Sister   .  Heart Problems Brother   . Cancer Brother   . Cancer Brother   . Cancer Brother   . Breast cancer Maternal Aunt     ADVANCED DIRECTIVES (Y/N):  N  HEALTH MAINTENANCE: Social History   Tobacco Use  . Smoking status: Current Every Day Smoker    Packs/day: 1.00    Years: 40.00    Pack years: 40.00    Types: Cigarettes    Start date: 05/05/1975  . Smokeless tobacco: Never Used  Vaping Use  . Vaping Use: Never used  Substance Use Topics  . Alcohol use: No    Alcohol/week: 0.0 standard drinks    Comment: socially  . Drug use: No      Colonoscopy:  PAP:  Bone density:  Lipid panel:  Allergies  Allergen Reactions  . Diclofenac-Misoprostol Other (See Comments)    Arthrotec - gastritis   . Nsaids Other (See Comments)    gastritis  . Zolpidem Other (See Comments)    Sleep walking   . Hydrocodone-Acetaminophen Nausea And Vomiting  . Simvastatin Other (See Comments)    body aches    Current Outpatient Medications  Medication Sig Dispense Refill  . ACCU-CHEK FASTCLIX LANCETS MISC     . ACCU-CHEK SMARTVIEW test strip     . albuterol (PROVENTIL) (2.5 MG/3ML) 0.083% nebulizer solution Take 2.5 mg by nebulization every 6 (six) hours as needed for wheezing or shortness of breath.    Marland Kitchen albuterol (VENTOLIN HFA) 108 (90 Base) MCG/ACT inhaler Inhale 2 puffs into the lungs every 4 (four) hours as needed for wheezing or shortness of breath.     Marland Kitchen amLODipine (NORVASC) 10 MG tablet Take 10 mg by mouth daily.     Marland Kitchen aspirin 81 MG chewable tablet Chew 1 tablet (81 mg total) by mouth daily. Continue holding now, as you are doing until procedure. (Patient taking differently: Chew 81 mg by mouth daily.) 30 tablet 0  . carbidopa-levodopa (SINEMET IR) 25-100 MG tablet Take 1 tablet by mouth 3 (three) times daily.    . diclofenac sodium (VOLTAREN) 1 % GEL     . esomeprazole (NEXIUM) 40 MG capsule Take 40 mg by mouth daily.    . Eszopiclone (ESZOPICLONE) 3 MG TABS Take 1 tablet (3 mg total) by mouth at bedtime as needed. Take immediately before bedtime 30 tablet 2  . ferrous sulfate 325 (65 FE) MG tablet Take 1 tablet (325 mg total) by mouth 2 (two) times daily with a meal. 30 tablet 3  . fluticasone (FLONASE) 50 MCG/ACT nasal spray Place 2 sprays into both nostrils daily as needed for allergies.    . Fluticasone-Umeclidin-Vilant 100-62.5-25 MCG/INH AEPB Inhale 1 puff into the lungs daily. Treligy    . gabapentin (NEURONTIN) 600 MG tablet Take 1 tablet (600 mg total) by mouth 3 (three) times daily. 180 tablet 0  . ipratropium  (ATROVENT) 0.02 % nebulizer solution Take 2.5 mLs (0.5 mg total) by nebulization every 6 (six) hours as needed for wheezing or shortness of breath. 75 mL 12  . metFORMIN (GLUCOPHAGE) 500 MG tablet Take 500 mg by mouth daily with breakfast.     . metoprolol tartrate (LOPRESSOR) 25 MG tablet Take 25 mg by mouth 2 (two) times daily.     . Multiple Vitamin (MULTIVITAMIN) tablet Take 1 tablet by mouth daily.    . OXYGEN Inhale 2 L into the lungs continuous.    . potassium chloride (KLOR-CON) 8 MEQ tablet Take 8 mEq by mouth daily.     Marland Kitchen  pravastatin (PRAVACHOL) 80 MG tablet Take 80 mg by mouth every evening.     Marland Kitchen spironolactone (ALDACTONE) 25 MG tablet Take 25 mg by mouth daily.     Marland Kitchen venlafaxine XR (EFFEXOR-XR) 150 MG 24 hr capsule Take 1 capsule (150 mg total) by mouth daily with breakfast. 90 capsule 0   No current facility-administered medications for this visit.    OBJECTIVE: There were no vitals filed for this visit.   There is no height or weight on file to calculate BMI.    ECOG FS:0 - Asymptomatic  General: Well-developed, well-nourished, no acute distress. Eyes: Pink conjunctiva, anicteric sclera. HEENT: Normocephalic, moist mucous membranes. Lungs: No audible wheezing or coughing. Heart: Regular rate and rhythm. Abdomen: Soft, nontender, no obvious distention. Musculoskeletal: No edema, cyanosis, or clubbing. Neuro: Alert, answering all questions appropriately. Cranial nerves grossly intact. Skin: No rashes or petechiae noted. Psych: Normal affect.  LAB RESULTS:  Lab Results  Component Value Date   NA 137 11/25/2019   K 4.1 11/25/2019   CL 98 11/25/2019   CO2 31 11/25/2019   GLUCOSE 133 (H) 11/25/2019   BUN 16 11/25/2019   CREATININE 0.80 08/29/2020   CALCIUM 9.4 11/25/2019   PROT 7.4 11/25/2019   ALBUMIN 4.1 11/25/2019   AST 14 (L) 11/25/2019   ALT 5 11/25/2019   ALKPHOS 76 11/25/2019   BILITOT 0.4 11/25/2019   GFRNONAA >60 11/25/2019   GFRAA >60 11/25/2019     Lab Results  Component Value Date   WBC 8.4 11/25/2019   NEUTROABS 7.0 11/25/2019   HGB 12.3 11/25/2019   HCT 38.7 11/25/2019   MCV 98.0 11/25/2019   PLT 234 11/25/2019     STUDIES: CT Chest W Contrast  Result Date: 02/28/2021 CLINICAL DATA:  Left non-small cell lung cancer restaging, status post chemotherapy and radiation EXAM: CT CHEST WITH CONTRAST TECHNIQUE: Multidetector CT imaging of the chest was performed during intravenous contrast administration. CONTRAST:  55m OMNIPAQUE IOHEXOL 300 MG/ML  SOLN COMPARISON:  08/29/2020 FINDINGS: Cardiovascular: Aortic atherosclerosis. Normal heart size. Three-vessel coronary artery calcifications. No pericardial effusion. Mediastinum/Nodes: No enlarged mediastinal, hilar, or axillary lymph nodes. Thyroid gland, trachea, and esophagus demonstrate no significant findings. Lungs/Pleura: Mild centrilobular emphysema. Diffuse bilateral bronchial wall thickening. No significant interval change in post treatment appearance of a nodule of the lingula, measuring approximately 1.7 x 1.4 cm with adjacent bandlike scarring and fibrosis (series 3, image 73). There is some fluctuant centrilobular nodularity in the posterior lingula which is generally improved compared to prior examination. Unchanged 4 mm nodule of the left pulmonary apex (series 3, image 25). No pleural effusion or pneumothorax. Upper Abdomen: No acute abnormality. Musculoskeletal: No chest wall mass or suspicious bone lesions identified. IMPRESSION: 1. No significant interval change in post treatment appearance of a nodule of the lingula, measuring approximately 1.7 x 1.4 cm with adjacent bandlike scarring and fibrosis. 2. There is some fluctuant centrilobular nodularity in the posterior lingula which is generally improved compared to prior examination, consistent with improved although ongoing atypical infection. 3. Unchanged 4 mm nodule of the left pulmonary apex, nonspecific, most likely  infectious or inflammatory. Continued attention on follow-up. 4. Emphysema. 5. Diffuse bilateral bronchial wall thickening, consistent with nonspecific infectious or inflammatory bronchitis. 6. Coronary artery disease. Aortic Atherosclerosis (ICD10-I70.0) and Emphysema (ICD10-J43.9). Electronically Signed   By: AEddie CandleM.D.   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  PD-L1 85%.  PLAN:    1. Squamous cell carcinoma of the upper lobe of left lung: Patient completed concurrent chemotherapy with weekly carboplatinum and Taxol along with daily XRT on approximately September 24 2019.  Patient's most recent CT sans from Feb 28, 2021 reviewed independently and report as above with no obvious evidence of recurrent or progressive disease.  Of note, MRI of the brain on July 22, 2019 was reported as negative.  No intervention is needed at this time.  Return to clinic in 6 months with repeat imaging and further evaluation.  At that point, patient will be nearly 2 years removed from completing her treatment and can be switched to yearly evaluation and imaging.     2.  Shortness of breath: Chronic and unchanged.  Continue oxygen as prescribed.  Patient expressed understanding and was in agreement with this plan. She also understands that She can call clinic at any time with any questions, concerns, or complaints.   Cancer Staging Squamous cell carcinoma of left lung [facility]) Staging form: Lung, AJCC 8th Edition - Clinical stage from 07/26/2019: Stage IIB (cT3, cN0, cM0) - Signed by Lloyd Huger, MD on 07/26/2019   Lloyd Huger, MD   03/01/2021 11:37 AM

## 2021-02-27 ENCOUNTER — Ambulatory Visit
Admission: RE | Admit: 2021-02-27 | Discharge: 2021-02-27 | Disposition: A | Discharge status: Home / Self Care | Payer: Medicare HMO | Source: Ambulatory Visit | Attending: Oncology | Admitting: Oncology

## 2021-02-27 ENCOUNTER — Other Ambulatory Visit: Payer: Self-pay

## 2021-02-27 DIAGNOSIS — C3492 Malignant neoplasm of unspecified part of left bronchus or lung: Secondary | ICD-10-CM | POA: Insufficient documentation

## 2021-02-27 DIAGNOSIS — I2510 Atherosclerotic heart disease of native coronary artery without angina pectoris: Secondary | ICD-10-CM | POA: Diagnosis not present

## 2021-02-27 DIAGNOSIS — J929 Pleural plaque without asbestos: Secondary | ICD-10-CM | POA: Diagnosis not present

## 2021-02-27 DIAGNOSIS — J432 Centrilobular emphysema: Secondary | ICD-10-CM | POA: Diagnosis not present

## 2021-02-27 MED ORDER — IOHEXOL 300 MG/ML  SOLN
75.0000 mL | Freq: Once | INTRAMUSCULAR | Status: AC | PRN
  Administered 2021-02-27: 75 mL via INTRAVENOUS

## 2021-03-01 ENCOUNTER — Ambulatory Visit
Admission: RE | Admit: 2021-03-01 | Discharge: 2021-03-01 | Disposition: A | Discharge status: Home / Self Care | Payer: Medicare HMO | Source: Ambulatory Visit | Attending: Radiation Oncology | Admitting: Radiation Oncology

## 2021-03-01 ENCOUNTER — Other Ambulatory Visit: Payer: Self-pay

## 2021-03-01 ENCOUNTER — Inpatient Hospital Stay: Payer: Medicare HMO | Attending: Oncology | Admitting: Oncology

## 2021-03-01 ENCOUNTER — Encounter: Payer: Self-pay | Admitting: Radiation Oncology

## 2021-03-01 VITALS — BP 136/82 | HR 78 | Temp 97.5°F | Resp 20 | Wt 207.0 lb

## 2021-03-01 DIAGNOSIS — Z9981 Dependence on supplemental oxygen: Secondary | ICD-10-CM | POA: Insufficient documentation

## 2021-03-01 DIAGNOSIS — F17210 Nicotine dependence, cigarettes, uncomplicated: Secondary | ICD-10-CM | POA: Insufficient documentation

## 2021-03-01 DIAGNOSIS — C3492 Malignant neoplasm of unspecified part of left bronchus or lung: Secondary | ICD-10-CM | POA: Diagnosis not present

## 2021-03-01 DIAGNOSIS — Z923 Personal history of irradiation: Secondary | ICD-10-CM | POA: Insufficient documentation

## 2021-03-01 DIAGNOSIS — Z9221 Personal history of antineoplastic chemotherapy: Secondary | ICD-10-CM | POA: Insufficient documentation

## 2021-03-01 DIAGNOSIS — R0602 Shortness of breath: Secondary | ICD-10-CM | POA: Diagnosis not present

## 2021-03-01 DIAGNOSIS — C3412 Malignant neoplasm of upper lobe, left bronchus or lung: Secondary | ICD-10-CM | POA: Insufficient documentation

## 2021-03-01 NOTE — Progress Notes (Signed)
Radiation Oncology Follow up Note  Name: Suzanne Glenn   Date:   03/01/2021 MRN:  [id number] DOB: [birth year]    This 70 y.o. 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  Seen today in routine follow-up she is doing well her breathing is stable.  She specifically denies any hemoptysis cough or chest tightness.  She is continual nasal oxygen..  She had a recent CT scan of the chest showing no significant interval change in posttreatment appearance of nodule of the lingula.  There is some posterior nodularity in the lingula generally improved compared to prior examination.  COMPLICATIONS OF TREATMENT: none  FOLLOW UP COMPLIANCE: keeps appointments   PHYSICAL EXAM:  BP 136/82   Pulse 78   Temp (!) 97.5 F (36.4 C) (Tympanic)   Resp 20   Wt 207 lb (93.9 kg)   BMI 36.67 kg/m  Patient is on nasal oxygen.  Well-developed well-nourished patient in NAD. HEENT reveals PERLA, EOMI, discs not visualized.  Oral cavity is clear. No oral mucosal lesions are identified. Neck is clear without evidence of cervical or supraclavicular adenopathy. Lungs are clear to A&P. Cardiac examination is essentially unremarkable with regular rate and rhythm without murmur rub or thrill. Abdomen is benign with no organomegaly or masses noted. Motor sensory and DTR levels are equal and symmetric in the upper and lower extremities. Cranial nerves II through XII are grossly intact. Proprioception is intact. No peripheral adenopathy or edema is identified. No motor or sensory levels are noted. Crude visual fields are within normal range.  RADIOLOGY RESULTS: CT scan reviewed compatible with above-stated findings  PLAN: At the  present time patient is doing well with stable disease by CT criteria and pleased with her overall progress.  I have asked to see her back in 6 months for follow-up.  Patient knows to call with any concerns.  I would like to take this opportunity to thank you for allowing me to participate in the care of your patient.Noreene Filbert, MD

## 2021-03-02 LAB — POCT I-STAT CREATININE: Creatinine, Ser: 0.8 mg/dL (ref 0.44–1.00)

## 2021-03-19 ENCOUNTER — Encounter: Payer: Self-pay | Admitting: Emergency Medicine

## 2021-03-19 ENCOUNTER — Emergency Department: Payer: Medicare HMO

## 2021-03-19 ENCOUNTER — Inpatient Hospital Stay
Admission: EM | Admit: 2021-03-19 | Discharge: 2021-03-23 | DRG: 193 | Disposition: A | Discharge status: Home / Self Care | Payer: Medicare HMO | Attending: Internal Medicine | Admitting: Internal Medicine

## 2021-03-19 ENCOUNTER — Other Ambulatory Visit: Payer: Self-pay

## 2021-03-19 DIAGNOSIS — Z825 Family history of asthma and other chronic lower respiratory diseases: Secondary | ICD-10-CM

## 2021-03-19 DIAGNOSIS — F411 Generalized anxiety disorder: Secondary | ICD-10-CM | POA: Diagnosis present

## 2021-03-19 DIAGNOSIS — G20 Parkinson's disease: Secondary | ICD-10-CM | POA: Diagnosis present

## 2021-03-19 DIAGNOSIS — I2510 Atherosclerotic heart disease of native coronary artery without angina pectoris: Secondary | ICD-10-CM | POA: Diagnosis present

## 2021-03-19 DIAGNOSIS — J189 Pneumonia, unspecified organism: Secondary | ICD-10-CM | POA: Diagnosis not present

## 2021-03-19 DIAGNOSIS — J9811 Atelectasis: Secondary | ICD-10-CM | POA: Diagnosis not present

## 2021-03-19 DIAGNOSIS — R0602 Shortness of breath: Secondary | ICD-10-CM | POA: Diagnosis not present

## 2021-03-19 DIAGNOSIS — E7800 Pure hypercholesterolemia, unspecified: Secondary | ICD-10-CM | POA: Diagnosis present

## 2021-03-19 DIAGNOSIS — R0902 Hypoxemia: Secondary | ICD-10-CM | POA: Diagnosis not present

## 2021-03-19 DIAGNOSIS — J9621 Acute and chronic respiratory failure with hypoxia: Secondary | ICD-10-CM | POA: Diagnosis not present

## 2021-03-19 DIAGNOSIS — E785 Hyperlipidemia, unspecified: Secondary | ICD-10-CM | POA: Diagnosis present

## 2021-03-19 DIAGNOSIS — J9611 Chronic respiratory failure with hypoxia: Secondary | ICD-10-CM | POA: Diagnosis present

## 2021-03-19 DIAGNOSIS — F17210 Nicotine dependence, cigarettes, uncomplicated: Secondary | ICD-10-CM | POA: Diagnosis present

## 2021-03-19 DIAGNOSIS — Z85118 Personal history of other malignant neoplasm of bronchus and lung: Secondary | ICD-10-CM

## 2021-03-19 DIAGNOSIS — M797 Fibromyalgia: Secondary | ICD-10-CM | POA: Diagnosis present

## 2021-03-19 DIAGNOSIS — Z9221 Personal history of antineoplastic chemotherapy: Secondary | ICD-10-CM | POA: Diagnosis not present

## 2021-03-19 DIAGNOSIS — F331 Major depressive disorder, recurrent, moderate: Secondary | ICD-10-CM | POA: Diagnosis present

## 2021-03-19 DIAGNOSIS — Z955 Presence of coronary angioplasty implant and graft: Secondary | ICD-10-CM

## 2021-03-19 DIAGNOSIS — Z923 Personal history of irradiation: Secondary | ICD-10-CM | POA: Diagnosis not present

## 2021-03-19 DIAGNOSIS — J449 Chronic obstructive pulmonary disease, unspecified: Secondary | ICD-10-CM | POA: Diagnosis present

## 2021-03-19 DIAGNOSIS — Z885 Allergy status to narcotic agent status: Secondary | ICD-10-CM

## 2021-03-19 DIAGNOSIS — Z720 Tobacco use: Secondary | ICD-10-CM | POA: Diagnosis present

## 2021-03-19 DIAGNOSIS — F32A Depression, unspecified: Secondary | ICD-10-CM | POA: Diagnosis present

## 2021-03-19 DIAGNOSIS — K5900 Constipation, unspecified: Secondary | ICD-10-CM | POA: Diagnosis not present

## 2021-03-19 DIAGNOSIS — R069 Unspecified abnormalities of breathing: Secondary | ICD-10-CM | POA: Diagnosis not present

## 2021-03-19 DIAGNOSIS — F3342 Major depressive disorder, recurrent, in full remission: Secondary | ICD-10-CM | POA: Diagnosis present

## 2021-03-19 DIAGNOSIS — Z79899 Other long term (current) drug therapy: Secondary | ICD-10-CM | POA: Diagnosis not present

## 2021-03-19 DIAGNOSIS — Z9851 Tubal ligation status: Secondary | ICD-10-CM

## 2021-03-19 DIAGNOSIS — E1140 Type 2 diabetes mellitus with diabetic neuropathy, unspecified: Secondary | ICD-10-CM | POA: Diagnosis not present

## 2021-03-19 DIAGNOSIS — G2581 Restless legs syndrome: Secondary | ICD-10-CM | POA: Diagnosis not present

## 2021-03-19 DIAGNOSIS — Z7982 Long term (current) use of aspirin: Secondary | ICD-10-CM | POA: Diagnosis not present

## 2021-03-19 DIAGNOSIS — R0689 Other abnormalities of breathing: Secondary | ICD-10-CM | POA: Diagnosis not present

## 2021-03-19 DIAGNOSIS — J439 Emphysema, unspecified: Secondary | ICD-10-CM | POA: Diagnosis present

## 2021-03-19 DIAGNOSIS — K219 Gastro-esophageal reflux disease without esophagitis: Secondary | ICD-10-CM | POA: Diagnosis present

## 2021-03-19 DIAGNOSIS — I10 Essential (primary) hypertension: Secondary | ICD-10-CM | POA: Diagnosis not present

## 2021-03-19 DIAGNOSIS — Z9981 Dependence on supplemental oxygen: Secondary | ICD-10-CM

## 2021-03-19 DIAGNOSIS — Z8249 Family history of ischemic heart disease and other diseases of the circulatory system: Secondary | ICD-10-CM

## 2021-03-19 DIAGNOSIS — C3492 Malignant neoplasm of unspecified part of left bronchus or lung: Secondary | ICD-10-CM | POA: Diagnosis present

## 2021-03-19 DIAGNOSIS — Z888 Allergy status to other drugs, medicaments and biological substances status: Secondary | ICD-10-CM

## 2021-03-19 DIAGNOSIS — Z818 Family history of other mental and behavioral disorders: Secondary | ICD-10-CM

## 2021-03-19 DIAGNOSIS — Z7984 Long term (current) use of oral hypoglycemic drugs: Secondary | ICD-10-CM

## 2021-03-19 DIAGNOSIS — Z833 Family history of diabetes mellitus: Secondary | ICD-10-CM

## 2021-03-19 DIAGNOSIS — Z20822 Contact with and (suspected) exposure to covid-19: Secondary | ICD-10-CM | POA: Diagnosis not present

## 2021-03-19 DIAGNOSIS — Z83438 Family history of other disorder of lipoprotein metabolism and other lipidemia: Secondary | ICD-10-CM

## 2021-03-19 LAB — RESP PANEL BY RT-PCR (FLU A&B, COVID) ARPGX2
Influenza A by PCR: NEGATIVE
Influenza B by PCR: NEGATIVE
SARS Coronavirus 2 by RT PCR: NEGATIVE

## 2021-03-19 LAB — CBC
HCT: 39.5 % (ref 36.0–46.0)
Hemoglobin: 12.6 g/dL (ref 12.0–15.0)
MCH: 30.1 pg (ref 26.0–34.0)
MCHC: 31.9 g/dL (ref 30.0–36.0)
MCV: 94.3 fL (ref 80.0–100.0)
Platelets: 255 10*3/uL (ref 150–400)
RBC: 4.19 MIL/uL (ref 3.87–5.11)
RDW: 15 % (ref 11.5–15.5)
WBC: 23.3 10*3/uL — ABNORMAL HIGH (ref 4.0–10.5)
nRBC: 0 % (ref 0.0–0.2)

## 2021-03-19 LAB — LACTIC ACID, PLASMA: Lactic Acid, Venous: 1.6 mmol/L (ref 0.5–1.9)

## 2021-03-19 LAB — BASIC METABOLIC PANEL
Anion gap: 10 (ref 5–15)
BUN: 17 mg/dL (ref 8–23)
CO2: 28 mmol/L (ref 22–32)
Calcium: 9.1 mg/dL (ref 8.9–10.3)
Chloride: 99 mmol/L (ref 98–111)
Creatinine, Ser: 0.74 mg/dL (ref 0.44–1.00)
GFR, Estimated: >60 mL/min (ref >60)
Glucose, Bld: 166 mg/dL — ABNORMAL HIGH (ref 70–99)
Potassium: 4.2 mmol/L (ref 3.5–5.1)
Sodium: 137 mmol/L (ref 135–145)

## 2021-03-19 MED ORDER — SODIUM CHLORIDE 0.9 % IV SOLN
500.0000 mg | Freq: Once | INTRAVENOUS | Status: AC
  Administered 2021-03-19: 500 mg via INTRAVENOUS
  Filled 2021-03-19: qty 500

## 2021-03-19 MED ORDER — METOPROLOL TARTRATE 25 MG PO TABS
25.0000 mg | ORAL_TABLET | Freq: Two times a day (BID) | ORAL | Status: DC
  Administered 2021-03-19 – 2021-03-23 (×8): 25 mg via ORAL
  Filled 2021-03-19 (×8): qty 1

## 2021-03-19 MED ORDER — MELATONIN 5 MG PO TABS
5.0000 mg | ORAL_TABLET | Freq: Every evening | ORAL | Status: DC | PRN
  Administered 2021-03-19 – 2021-03-20 (×2): 5 mg via ORAL
  Filled 2021-03-19 (×2): qty 1

## 2021-03-19 MED ORDER — ACETAMINOPHEN 325 MG PO TABS: 650.0000 mg | ORAL_TABLET | Freq: Four times a day (QID) | ORAL | Status: AC | PRN

## 2021-03-19 MED ORDER — ADULT MULTIVITAMIN W/MINERALS CH
1.0000 | ORAL_TABLET | Freq: Every day | ORAL | Status: DC
  Administered 2021-03-20 – 2021-03-23 (×4): 1 via ORAL
  Filled 2021-03-19 (×4): qty 1

## 2021-03-19 MED ORDER — FLUTICASONE PROPIONATE 50 MCG/ACT NA SUSP
2.0000 | Freq: Every day | NASAL | Status: DC | PRN
  Filled 2021-03-19: qty 16

## 2021-03-19 MED ORDER — ALBUTEROL SULFATE HFA 108 (90 BASE) MCG/ACT IN AERS
2.0000 | INHALATION_SPRAY | RESPIRATORY_TRACT | Status: DC | PRN
  Filled 2021-03-19: qty 6.7

## 2021-03-19 MED ORDER — PANTOPRAZOLE SODIUM 40 MG PO TBEC
80.0000 mg | DELAYED_RELEASE_TABLET | Freq: Every day | ORAL | Status: DC
  Administered 2021-03-20 – 2021-03-22 (×3): 80 mg via ORAL
  Filled 2021-03-19 (×4): qty 2

## 2021-03-19 MED ORDER — VITAMIN B-12 1000 MCG PO TABS
1000.0000 ug | ORAL_TABLET | Freq: Every day | ORAL | Status: DC
  Administered 2021-03-20 – 2021-03-23 (×4): 1000 ug via ORAL
  Filled 2021-03-19 (×4): qty 1

## 2021-03-19 MED ORDER — AMLODIPINE BESYLATE 10 MG PO TABS
10.0000 mg | ORAL_TABLET | Freq: Every day | ORAL | Status: DC
  Administered 2021-03-20 – 2021-03-23 (×4): 10 mg via ORAL
  Filled 2021-03-19 (×4): qty 1

## 2021-03-19 MED ORDER — METFORMIN HCL 500 MG PO TABS
500.0000 mg | ORAL_TABLET | Freq: Every day | ORAL | Status: DC
  Administered 2021-03-20 – 2021-03-23 (×4): 500 mg via ORAL
  Filled 2021-03-19 (×4): qty 1

## 2021-03-19 MED ORDER — VENLAFAXINE HCL ER 150 MG PO CP24
150.0000 mg | ORAL_CAPSULE | Freq: Every day | ORAL | Status: DC
  Administered 2021-03-20 – 2021-03-23 (×4): 150 mg via ORAL
  Filled 2021-03-19: qty 1
  Filled 2021-03-19 (×2): qty 2
  Filled 2021-03-19 (×2): qty 1
  Filled 2021-03-19: qty 2
  Filled 2021-03-19: qty 1

## 2021-03-19 MED ORDER — UMECLIDINIUM BROMIDE 62.5 MCG/INH IN AEPB
1.0000 | INHALATION_SPRAY | Freq: Every day | RESPIRATORY_TRACT | Status: DC
  Administered 2021-03-20 – 2021-03-23 (×4): 1 via RESPIRATORY_TRACT
  Filled 2021-03-19: qty 7

## 2021-03-19 MED ORDER — ONDANSETRON HCL 4 MG PO TABS: 4.0000 mg | ORAL_TABLET | Freq: Four times a day (QID) | ORAL | Status: DC | PRN

## 2021-03-19 MED ORDER — ALBUTEROL SULFATE (2.5 MG/3ML) 0.083% IN NEBU
2.5000 mg | INHALATION_SOLUTION | Freq: Four times a day (QID) | RESPIRATORY_TRACT | Status: DC | PRN
  Administered 2021-03-20 – 2021-03-22 (×2): 2.5 mg via RESPIRATORY_TRACT
  Filled 2021-03-19 (×3): qty 3

## 2021-03-19 MED ORDER — ACETAMINOPHEN 650 MG RE SUPP: 650.0000 mg | Freq: Four times a day (QID) | RECTAL | Status: AC | PRN

## 2021-03-19 MED ORDER — FLUTICASONE-UMECLIDIN-VILANT 100-62.5-25 MCG/INH IN AEPB: 1.0000 | INHALATION_SPRAY | Freq: Every day | RESPIRATORY_TRACT | Status: DC

## 2021-03-19 MED ORDER — FLUTICASONE FUROATE-VILANTEROL 100-25 MCG/INH IN AEPB
1.0000 | INHALATION_SPRAY | Freq: Every day | RESPIRATORY_TRACT | Status: DC
  Administered 2021-03-20 – 2021-03-23 (×4): 1 via RESPIRATORY_TRACT
  Filled 2021-03-19: qty 28

## 2021-03-19 MED ORDER — ENOXAPARIN SODIUM 60 MG/0.6ML IJ SOSY
0.5000 mg/kg | PREFILLED_SYRINGE | INTRAMUSCULAR | Status: DC
  Administered 2021-03-19 – 2021-03-22 (×4): 47.5 mg via SUBCUTANEOUS
  Filled 2021-03-19 (×4): qty 0.6

## 2021-03-19 MED ORDER — ASPIRIN 81 MG PO CHEW
81.0000 mg | CHEWABLE_TABLET | Freq: Every day | ORAL | Status: DC
  Administered 2021-03-20 – 2021-03-23 (×4): 81 mg via ORAL
  Filled 2021-03-19 (×4): qty 1

## 2021-03-19 MED ORDER — GABAPENTIN 600 MG PO TABS
600.0000 mg | ORAL_TABLET | Freq: Three times a day (TID) | ORAL | Status: DC
  Administered 2021-03-19 – 2021-03-20 (×3): 600 mg via ORAL
  Filled 2021-03-19 (×3): qty 1

## 2021-03-19 MED ORDER — IPRATROPIUM BROMIDE 0.02 % IN SOLN: 0.5000 mg | Freq: Four times a day (QID) | RESPIRATORY_TRACT | Status: DC | PRN

## 2021-03-19 MED ORDER — CARBIDOPA-LEVODOPA 25-100 MG PO TABS
1.0000 | ORAL_TABLET | Freq: Three times a day (TID) | ORAL | Status: DC
  Administered 2021-03-19 – 2021-03-23 (×12): 1 via ORAL
  Filled 2021-03-19 (×12): qty 1

## 2021-03-19 MED ORDER — SPIRONOLACTONE 25 MG PO TABS: 25.0000 mg | ORAL_TABLET | Freq: Every day | ORAL | Status: DC

## 2021-03-19 MED ORDER — PRAVASTATIN SODIUM 40 MG PO TABS
80.0000 mg | ORAL_TABLET | Freq: Every day | ORAL | Status: DC
  Administered 2021-03-19 – 2021-03-22 (×4): 80 mg via ORAL
  Filled 2021-03-19: qty 2
  Filled 2021-03-19 (×2): qty 4
  Filled 2021-03-19 (×4): qty 2
  Filled 2021-03-19 (×2): qty 4

## 2021-03-19 MED ORDER — SODIUM CHLORIDE 0.9 % IV SOLN
2.0000 g | INTRAVENOUS | Status: AC
  Administered 2021-03-20 – 2021-03-21 (×2): 2 g via INTRAVENOUS
  Filled 2021-03-19 (×2): qty 20

## 2021-03-19 MED ORDER — IPRATROPIUM-ALBUTEROL 0.5-2.5 (3) MG/3ML IN SOLN
3.0000 mL | Freq: Once | RESPIRATORY_TRACT | Status: AC
  Administered 2021-03-19: 3 mL via RESPIRATORY_TRACT
  Filled 2021-03-19: qty 3

## 2021-03-19 MED ORDER — SODIUM CHLORIDE 0.9 % IV SOLN
1.0000 g | Freq: Once | INTRAVENOUS | Status: AC
  Administered 2021-03-19: 1 g via INTRAVENOUS
  Filled 2021-03-19: qty 10

## 2021-03-19 MED ORDER — ONDANSETRON HCL 4 MG/2ML IJ SOLN: 4.0000 mg | Freq: Four times a day (QID) | INTRAMUSCULAR | Status: DC | PRN

## 2021-03-19 MED ORDER — GUAIFENESIN-DM 100-10 MG/5ML PO SYRP
5.0000 mL | ORAL_SOLUTION | ORAL | Status: DC | PRN
  Administered 2021-03-19 – 2021-03-21 (×5): 5 mL via ORAL
  Filled 2021-03-19 (×5): qty 5

## 2021-03-19 MED ORDER — SODIUM CHLORIDE 0.9 % IV SOLN
500.0000 mg | INTRAVENOUS | Status: AC
Start: 2021-03-20 — End: 2021-03-21
  Administered 2021-03-20 – 2021-03-21 (×2): 500 mg via INTRAVENOUS
  Filled 2021-03-19 (×2): qty 500

## 2021-03-19 MED ORDER — SPIRONOLACTONE 25 MG PO TABS
25.0000 mg | ORAL_TABLET | Freq: Every day | ORAL | Status: DC
  Filled 2021-03-19: qty 1

## 2021-03-19 NOTE — ED Notes (Signed)
Pt has had to increase her oxygen to 3L since yesterday. C/o SOB. NAD at this time. Wheelchaired to room.

## 2021-03-19 NOTE — Progress Notes (Signed)
PHARMACIST - PHYSICIAN COMMUNICATION  CONCERNING:  Enoxaparin (Lovenox) for DVT Prophylaxis    RECOMMENDATION: Patient was prescribed enoxaprin 40mg  q24 hours for VTE prophylaxis.   Filed Weights   03/19/21 1250  Weight: 93.9 kg (207 lb)    Body mass index is 36.67 kg/m.  Estimated Creatinine Clearance: 73.3 mL/min (by C-G formula based on SCr of 0.74 mg/dL).   Based on [place] patient is candidate for enoxaparin 0.5mg /kg TBW SQ every 24 hours based on BMI being >30.  DESCRIPTION: Pharmacy has adjusted enoxaparin dose per [facility] policy.  Patient is now receiving enoxaparin 47.5 mg every 24 hours   Benn Moulder, PharmD Pharmacy Resident  03/19/2021 5:35 PM

## 2021-03-19 NOTE — ED Notes (Signed)
Transport requested

## 2021-03-19 NOTE — ED Provider Notes (Signed)
[facility] Emergency Department Provider Note ____________________________________________  Time seen: 1345  I have reviewed the triage vital signs and the nursing notes.  HISTORY  Chief Complaint  Shortness of Breath   HPI AVAH BASHOR is a 70 y.o. female presents to the ER today with complaint of headache, runny nose, ear pain, cough, chest congestion and shortness of breath.  She reports this started 2 to 3 days ago.  The headache is located in her temples and forehead.  She describes the pain as pressure.  She denies visual changes or dizziness.  She is blowing clear mucus out of her nose.  She describes the ear pain as sore and achy without drainage or loss of hearing.  The cough is productive of green mucus.  Her shortness of breath has worsened over the last 24 hours.  She is chronically on 2 L of oxygen via nasal cannula which was bumped up to 3 L by EMS.  She denies nasal congestion, sore throat or chest pain.  She denies fever, chills, body aches, nausea, vomiting or diarrhea.  She has been using her Trelegy, albuterol inhaler and nebulizer as prescribed.  She has not had exposure to COVID that she is aware of.  She has not had her COVID vaccines.  She does continue to smoke.  Past Medical History:  Diagnosis Date  . Anxiety   . Asthma   . Cancer ([place]) 12/2018   w/u for right upper lobe mass/cancer  . COPD (chronic obstructive pulmonary disease) (HCC)    also, emphysema. now using o2 via np 24 hours a day  . Coronary artery disease   . Depression   . Diabetes mellitus, type II ([place])   . GERD (gastroesophageal reflux disease)   . Headache    migraines in early 20's  . Heart murmur   . Hypertension   . Lung cancer ([place])   . Neuropathy   . Restless leg     Patient Active Problem List   Diagnosis Date Noted  . Atypical pneumonia 03/19/2021  . MDD (major depressive disorder), recurrent, in full remission ([place]) 12/11/2019  . Squamous cell  carcinoma of left lung ([place]) 07/26/2019  . Goals of care, counseling/discussion 07/26/2019  . Major depressive disorder, recurrent, in partial remission ([place]) 03/27/2019  . Chronic respiratory failure with hypoxia ([place]) 03/27/2019  . Urge incontinence of urine 03/27/2019  . Tobacco use 01/13/2019  . Mass of upper lobe of left lung 01/13/2019  . Symptomatic anemia 01/01/2019  . Restless leg syndrome 06/30/2018  . Pure hypercholesterolemia 09/26/2017  . Sepsis due to pneumonia ([place]) 02/24/2017  . Bilateral carotid artery stenosis 03/30/2016  . Syncope and collapse 03/30/2016  . Essential (primary) hypertension 08/29/2015  . Controlled type 2 diabetes mellitus without complication ([place]) 46/96/2952  . Combined fat and carbohydrate induced hyperlipemia 06/30/2015  . Clinical depression 05/05/2015  . GAD (generalized anxiety disorder) 05/05/2015  . Depression, major, recurrent, moderate ([place]) 05/05/2015  . Benign essential HTN 04/05/2015  . Diabetes mellitus, type 2 ([place]) 02/09/2015  . Type 2 diabetes mellitus ([place]) 02/09/2015  . Cannot sleep 01/24/2015  . Fibromyalgia 01/24/2015  . CAFL (chronic airflow limitation) ([place]) 01/24/2015  . H/O diabetes mellitus 01/24/2015  . H/O hypercholesterolemia 01/24/2015  . H/O: HTN (hypertension) 01/24/2015  . Coronary artery disease 01/24/2015  . Hypokalemia 01/24/2015  . Arteriosclerosis of coronary artery 10/04/2014  . Chest pain 10/04/2014  . 3-vessel CAD 08/02/2014  . Acute chest pain 06/25/2014  . Breath  shortness 06/25/2014  . COPD, moderate ([place]) 06/08/2014  . Moderate COPD (chronic obstructive pulmonary disease) ([place]) 06/08/2014  . Gastro-esophageal reflux disease without esophagitis 05/31/2014    Past Surgical History:  Procedure Laterality Date  . BACK SURGERY  2005   surgery x 2, disc fused in neck, pinched nerve in center of back and neck  . CARDIAC CATHETERIZATION  2015   1 stent placed for blockage  . cervical bone infusion     . CHOLECYSTECTOMY    . THORACIC DISC SURGERY    . TUBAL LIGATION    . VIDEO BRONCHOSCOPY WITH ENDOBRONCHIAL ULTRASOUND N/A 03/06/2019   Procedure: VIDEO BRONCHOSCOPY WITH ENDOBRONCHIAL ULTRASOUND - DIABETIC;  Surgeon: Ottie Glazier, MD;  Location: [facility] ORS;  Service: Thoracic;  Laterality: N/A;    Prior to Admission medications   Medication Sig Start Date End Date Taking? Authorizing Provider  ACCU-CHEK FASTCLIX LANCETS [place]  02/18/15   [provider]  ACCU-CHEK SMARTVIEW test strip  02/18/15   [provider]  albuterol (PROVENTIL) (2.5 MG/3ML) 0.083% nebulizer solution Take 2.5 mg by nebulization every 6 (six) hours as needed for wheezing or shortness of breath.    [provider]  albuterol (VENTOLIN HFA) 108 (90 Base) MCG/ACT inhaler Inhale 2 puffs into the lungs every 4 (four) hours as needed for wheezing or shortness of breath.  03/22/14   [provider]  amLODipine (NORVASC) 10 MG tablet Take 10 mg by mouth daily.     [provider]  aspirin 81 MG chewable tablet Chew 1 tablet (81 mg total) by mouth daily. Continue holding now, as you are doing until procedure. Patient taking differently: Chew 81 mg by mouth daily. 01/02/19   Vaughan Basta, MD  carbidopa-levodopa (SINEMET IR) 25-100 MG tablet Take 1 tablet by mouth 3 (three) times daily.    [provider]  diclofenac sodium (VOLTAREN) 1 % GEL  06/04/19   [provider]  esomeprazole (NEXIUM) 40 MG capsule Take 40 mg by mouth daily.    [provider]  Eszopiclone (ESZOPICLONE) 3 MG TABS Take 1 tablet (3 mg total) by mouth at bedtime as needed. Take immediately before bedtime 02/20/21 02/20/22  [place] Crane, MD  ferrous sulfate 325 (65 FE) MG tablet Take 1 tablet (325 mg total) by mouth 2 (two) times daily with a meal. 01/02/19   Vaughan Basta, MD  fluticasone (FLONASE) 50 MCG/ACT nasal spray Place 2 sprays into both nostrils daily as needed for  allergies. 12/30/18   [provider]  Fluticasone-Umeclidin-Vilant 100-62.5-25 MCG/INH AEPB Inhale 1 puff into the lungs daily. [place] 11/28/18   [provider]  gabapentin (NEURONTIN) 600 MG tablet Take 1 tablet (600 mg total) by mouth 3 (three) times daily. 02/20/21   [place] Crane, MD  ipratropium (ATROVENT) 0.02 % nebulizer solution Take 2.5 mLs (0.5 mg total) by nebulization every 6 (six) hours as needed for wheezing or shortness of breath. 02/26/17   Fritzi Mandes, MD  metFORMIN (GLUCOPHAGE) 500 MG tablet Take 500 mg by mouth daily with breakfast.     [provider]  metoprolol tartrate (LOPRESSOR) 25 MG tablet Take 25 mg by mouth 2 (two) times daily.     [provider]  Multiple Vitamin (MULTIVITAMIN) tablet Take 1 tablet by mouth daily.    [provider]  OXYGEN Inhale 2 L into the lungs continuous.    [provider]  potassium chloride (KLOR-CON) 8 MEQ tablet Take 8 mEq by mouth daily.  [provider]  pravastatin (PRAVACHOL) 80 MG tablet Take 80 mg by mouth every evening.     [provider]  spironolactone (ALDACTONE) 25 MG tablet Take 25 mg by mouth daily.     [provider]  venlafaxine XR (EFFEXOR-XR) 150 MG 24 hr capsule Take 1 capsule (150 mg total) by mouth daily with breakfast. 02/20/21   [place] Crane, MD    Allergies Diclofenac-misoprostol, Nsaids, Zolpidem, Hydrocodone-acetaminophen, and Simvastatin  Family History  Problem Relation Age of Onset  . Anxiety disorder Mother   . Cancer - Lung Mother   . Depression Sister   . Cancer Sister   . Depression Brother   . Cancer Sister   . Cancer Sister   . Cancer Sister   . Heart Problems Sister   . Bipolar disorder Sister   . Diabetes Sister   . Cancer - Lung Sister   . Hypertension Sister   . Diabetes Sister   . Hyperlipidemia Sister   . COPD Sister   . Heart Problems Brother   . Cancer Brother   . Cancer Brother   . Cancer Brother    . Breast cancer Maternal Aunt     Social History Social History   Tobacco Use  . Smoking status: Current Every Day Smoker    Packs/day: 1.00    Years: 40.00    Pack years: 40.00    Types: Cigarettes    Start date: 05/05/1975  . Smokeless tobacco: Never Used  Vaping Use  . Vaping Use: Never used  Substance Use Topics  . Alcohol use: No    Alcohol/week: 0.0 standard drinks    Comment: socially  . Drug use: No    Review of Systems  Constitutional: Negative for fever, chills or body aches. Eyes: Negative for visual changes. ENT: Positive for runny nose, ear pain.  Negative for nasal congestion or sore throat. Cardiovascular: Negative for chest pain or chest tightness. Respiratory: Positive for cough and shortness of breath. Gastrointestinal: Negative for nausea, vomiting and diarrhea. Neurological: Positive for headache.  Negative for dizziness, focal weakness, tingling or numbness. ____________________________________________  PHYSICAL EXAM:  VITAL SIGNS: ED Triage Vitals  Enc Vitals Group     BP 03/19/21 1249 100/72     Pulse Rate 03/19/21 1249 98     Resp 03/19/21 1249 (!) 22     Temp 03/19/21 1249 98.8 F (37.1 C)     Temp Source 03/19/21 1249 Oral     SpO2 03/19/21 1249 96 %     Weight 03/19/21 1250 207 lb (93.9 kg)     Height 03/19/21 1250 5\' 3"  (1.6 m)     Head Circumference --      Peak Flow --      Pain Score 03/19/21 1250 0     Pain Loc --      Pain Edu? --      Excl. in [place]? --     Constitutional: Alert and oriented.  Chronically ill-appearing but in no distress. Head: Normocephalic and atraumatic. Eyes: Normal extraocular movements Ears: Canals clear. TMs intact bilaterally. Nose: No congestion/rhinorrhea/epistaxis. Hematological/Lymphatic/Immunological: No cervical lymphadenopathy. Cardiovascular: Normal rate, regular rhythm.  Respiratory: Tachypneic with slightly increased respiratory effort.  Bilateral expiratory wheezing and rhonchi noted.   No respiratory distress. Neurologic:  Normal speech and language. No gross focal neurologic deficits are appreciated. Skin:  Skin is warm, dry and intact. No rash noted.  ____________________________________________   LABS  Labs Reviewed  BASIC METABOLIC PANEL - Abnormal;  Notable for the following components:      Result Value   Glucose, Bld 166 (*)    All other components within normal limits  CBC - Abnormal; Notable for the following components:   WBC 23.3 (*)    All other components within normal limits  RESP PANEL BY RT-PCR (FLU A&B, COVID) ARPGX2  CULTURE, BLOOD (ROUTINE X 2)  CULTURE, BLOOD (ROUTINE X 2)  LACTIC ACID, PLASMA  LACTIC ACID, PLASMA  HIV ANTIBODY (ROUTINE TESTING W REFLEX)  BASIC METABOLIC PANEL  CBC    ____________________________________________   RADIOLOGY  Imaging Orders     DG Chest 2 View     CT Chest Wo Contrast   IMPRESSION: Left midlung atelectasis. Lungs elsewhere clear. Cardiac silhouette within normal limits. Aortic Atherosclerosis (ICD10-I70.0).     IMPRESSION: 1. There has been a very substantial interval worsening of diffuse centrilobular and tree-in-bud pulmonary nodularity throughout the lungs, consistent with worsened atypical infection, particularly atypical Mycobacterium. 2. Diffuse bilateral bronchial wall thickening and scattered bronchial plugging, consistent with nonspecific infectious or inflammatory bronchitis. 3. Slight interval enlargement of multiple mediastinal lymph nodes, likely reactive to infection. 4. Unchanged, predominantly bandlike post treatment appearance of a lingular nodule compared to recent staging CT. 5. Coronary artery disease.   ____________________________________________   INITIAL IMPRESSION / ASSESSMENT AND PLAN / ED COURSE  Acute Headache, Runny Nose, Ear Pain, Cough and SOB:  DDx include viral URI with cough, covid, COPD exacerbation, pneumonia Chest xray does not show signs of pneumonia Duoneb  given Covid negative WBC 23.3, not recently on Prednisone Will obtain CT chest for further evaluation- c/w atypical pneumonia Will obtain lactic acid and blood cultures x 2 D/w Dr. Joni Fears, recommends IV Azithromycin 500 mg and IV Rocephin 1 gm, consult hospitalist for admission Dr. Tobie Poet returned paged, will admit for atypical pneumonia ____________________________________________  FINAL CLINICAL IMPRESSION(S) / ED DIAGNOSES  Final diagnoses:  Atypical pneumonia      Jearld Fenton, NP 03/19/21 [place]    Carrie Mew, MD 03/19/21 2354

## 2021-03-19 NOTE — ED Notes (Signed)
Report to russell RN

## 2021-03-19 NOTE — ED Notes (Signed)
Message sent to floor  Nurse

## 2021-03-19 NOTE — ED Triage Notes (Signed)
Pt to ED via [facility] for shortness of breath since yesterday. Pt states that she is chronically on 2 liters of O2 but she had to be increased to 3 liters. Pt has audible wheezing in triage. Pt is able to speak in complete sentences at this time. Pt has hx/o COPD. Pt is coughing up greenish colored sputum.

## 2021-03-19 NOTE — H&P (Addendum)
History and Physical   Suzanne Glenn IRJ:[id number] DOB: 1951/09/16 DOA: 03/19/2021  PCP: Tracie Harrier, MD  Outpatient Specialists: Dr. Grayland Ormond (oncology), Dr. Toy Care (psychiatry), Dr. Raul Del (pulmonology) Patient coming from: Home via EMS  I have personally briefly reviewed patient's old medical records in [place].  Chief Concern: Shortness of breath  HPI: Suzanne Glenn is a 70 y.o. 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  She presents emergency department for chief concerns of worsening shortness of breath and productive cough.  She states that the shortness of breath started AM of 03/18/21.  She reports that the green sputum started on 03/17/2021.  She endorses that she takes care of her 31 year old grandson, who has been having a sore throat and cough.   Sharp chest pain worse with coughing.   Social history: She lives with husband at home. She smokes 1 ppd, started at age 2.  At her peak she was smoking 1.5 packs/day.  She denies etoh and recreational drug use. She is disabled and formerly worked Producer, television/film/video  Vaccinations: Patient has received 3 doses of COVID 19 vaccination  ROS: Constitutional: no weight change, no fever ENT/Mouth: no sore throat, no rhinorrhea Eyes: no eye pain, no vision changes Cardiovascular: no chest pain, + dyspnea,  no edema, no palpitations Respiratory: + cough, + sputum, no wheezing Gastrointestinal: no nausea, no vomiting, no diarrhea, no  constipation Genitourinary: no urinary incontinence, no dysuria, no hematuria Musculoskeletal: no arthralgias, + myalgias Skin: no skin lesions, no pruritus, Neuro: + weakness, no loss of consciousness, no syncope Psych: no anxiety, no depression, + decrease appetite Heme/Lymph: no bruising, no bleeding  ED Course: Discussed with ED provider, patient requiring hospitalization for shortness of breath secondary to pneumonia.  Vitals in the emergency department was remarkable for temperature of 98.8, respiration rate of 30, heart rate 96, blood pressure 125/63, SPO2 of 98% on 3 L nasal cannula.  Labs in the emergency department was remarkable for WBC of 23.3, hemoglobin 12.6, hematocrit 39.5, platelets 255, potassium 137, BUN 17, serum creatinine of 0.74, nonfasting blood glucose 166.  COVID PCR/influenza A/influenza B were negative.  EDP ordered a CT of the chest which was read as substantial interval worsening of diffuse centrilobular and tree-in-bud pulmonary nodularity throughout the lungs, consistent with worsened atypical infection.  Diffuse bilateral bronchial wall thickening and scattered bronchial plugging, consistent with nonspecific infectious or inflammatory bronchitis.  Assessment/Plan  Principal Problem:   Atypical pneumonia Active Problems:   Fibromyalgia   Coronary artery disease   Benign essential HTN   COPD, moderate (HCC)   Clinical depression   Gastro-esophageal reflux disease without esophagitis   GAD (generalized anxiety disorder)   Depression, major, recurrent, moderate (HCC)   Essential (primary) hypertension   Pure hypercholesterolemia   Restless leg syndrome   Tobacco use   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HCC)   Chronic respiratory failure with hypoxia (HCC)   MDD (major depressive disorder), recurrent, in full remission ([place])   Atypical pneumonia Acute hypoxic respiratory failure secondary to pneumonia Leukocytosis - Admit to observation,  MedSurg, with telemetry for 24  hours - Status post ceftriaxone 1 g IV and azithromycin 500 mg IV per EDP -We will continue with ceftriaxone 2 g IV and azithromycin 500 mg IV starting on 03/20/2021, 2 doses ordered for each - incentive spirometry and flutter valve ordered -Extensive counseling for tobacco cessation  COPD- status post duo nebs per EDP - Continue 3 L nasal cannula - Resumed trilogy 1 puff daily - Patient states that she did not have a chance to take her trilogy this a.m. prior to ED presentation - Ipratropium nebulizer every 6 hours.  For wheezing and shortness of breath, albuterol nebulizer every 6 hours as needed for wheezing and shortness of breath resumed -Albuterol inhaler 2 puffs every 2 hours as needed for shortness of breath and wheezing  Hypertension-controlled, patient states that she took her antihypertensive medication prior to ED presentation -Resumed amlodipine 10 mg daily for 6/6, metoprolol 25 mg p.o. twice daily, first dose on 6/5 at 2200, spironolactone 25 mg p.o. daily  Hyperlipidemia-pravastatin 80 mg nightly  Major depressive disorder-venlafaxine 150 mg p.o. daily before breakfast resumed  Non-insulin-dependent diabetes mellitus- metformin 500 mg p.o. daily with breakfast resumed  GERD- PPI  Neuropathy-gabapentin 600 mg p.o. 3 times daily  Parkinson-resumed carbidopa-levodopa 25-100 mg p.o. daily 3 times daily  Tobacco abuse Patient endorses readiness to stop tobacco use.  Tobacco cessation counseling:  1. Week one, smoke 19 cigarettes per day. Week two, smoke 18 cigarettes per day. Week three, smoke 17 cigarettes per day, continue until smoking half the amount of cigarettes per day 2. Only smoke outside and wear outer covering 3. Leave cigarettes and lighters outside and in separate places 4. During in-between cigarettes, if you feel the urge to smoke, use the following: stress squeezing devices/phone a trusted friend to talk you through the  urge/walk in a safe environment 5. Avoid prolong interactions with individuals actively smoking cigarettes 6. If you smoke in social setting, avoid social setting where cigarette smoking is expected or considered acceptable  7. Call [facility] if in need of nicotine patches to help with cessation  Chart reviewed.   DVT prophylaxis: Enoxaparin daily every 24 hours subcutaneous Code Status: Full code Diet: Heart healthy/carb modified Family Communication: No Disposition Plan: Pending clinical course Consults called: None at this time Admission status: Observation, MedSurg, 24 hours of telemetry ordered  Past Medical History:  Diagnosis Date  . Anxiety   . Asthma   . Cancer ([place]) 12/2018   w/u for right upper lobe mass/cancer  . COPD (chronic obstructive pulmonary disease) (HCC)    also, emphysema. now using o2 via np 24 hours a day  . Coronary artery disease   . Depression   . Diabetes mellitus, type II ([place])   . GERD (gastroesophageal reflux disease)   . Headache    migraines in early 20's  . Heart murmur   . Hypertension   . Lung cancer ([place])   . Neuropathy   . Restless leg    Past Surgical History:  Procedure Laterality Date  . BACK SURGERY  2005   surgery x 2, disc fused in neck, pinched nerve in center of back and neck  . CARDIAC CATHETERIZATION  2015   1 stent placed for blockage  . cervical bone infusion    . CHOLECYSTECTOMY    . THORACIC DISC SURGERY    . TUBAL LIGATION    . VIDEO BRONCHOSCOPY WITH ENDOBRONCHIAL ULTRASOUND N/A 03/06/2019   Procedure: VIDEO BRONCHOSCOPY WITH ENDOBRONCHIAL ULTRASOUND - DIABETIC;  Surgeon:  Ottie Glazier, MD;  Location: [facility] ORS;  Service: Thoracic;  Laterality: N/A;   Social History:  reports that she has been smoking cigarettes. She started smoking about 45 years ago. She has a 40.00 pack-year smoking history. She has never used smokeless tobacco. She reports that she does not drink alcohol and does not use drugs.  Allergies   Allergen Reactions  . Diclofenac-Misoprostol Other (See Comments)    Arthrotec - gastritis   . Nsaids Other (See Comments)    gastritis  . Zolpidem Other (See Comments)    Sleep walking   . Hydrocodone-Acetaminophen Nausea And Vomiting  . Simvastatin Other (See Comments)    body aches   Family History  Problem Relation Age of Onset  . Anxiety disorder Mother   . Cancer - Lung Mother   . Depression Sister   . Cancer Sister   . Depression Brother   . Cancer Sister   . Cancer Sister   . Cancer Sister   . Heart Problems Sister   . Bipolar disorder Sister   . Diabetes Sister   . Cancer - Lung Sister   . Hypertension Sister   . Diabetes Sister   . Hyperlipidemia Sister   . COPD Sister   . Heart Problems Brother   . Cancer Brother   . Cancer Brother   . Cancer Brother   . Breast cancer Maternal Aunt    Family history: Family history reviewed and not pertinent  Prior to Admission medications   Medication Sig Start Date End Date Taking? Authorizing Provider  ACCU-CHEK FASTCLIX LANCETS [place]  02/18/15   [provider]  ACCU-CHEK SMARTVIEW test strip  02/18/15   [provider]  albuterol (PROVENTIL) (2.5 MG/3ML) 0.083% nebulizer solution Take 2.5 mg by nebulization every 6 (six) hours as needed for wheezing or shortness of breath.    [provider]  albuterol (VENTOLIN HFA) 108 (90 Base) MCG/ACT inhaler Inhale 2 puffs into the lungs every 4 (four) hours as needed for wheezing or shortness of breath.  03/22/14   [provider]  amLODipine (NORVASC) 10 MG tablet Take 10 mg by mouth daily.     [provider]  aspirin 81 MG chewable tablet Chew 1 tablet (81 mg total) by mouth daily. Continue holding now, as you are doing until procedure. Patient taking differently: Chew 81 mg by mouth daily. 01/02/19   Vaughan Basta, MD  carbidopa-levodopa (SINEMET IR) 25-100 MG tablet Take 1 tablet by mouth 3 (three) times daily.    [provider]  diclofenac sodium (VOLTAREN) 1 % GEL  06/04/19   [provider]  esomeprazole (NEXIUM) 40 MG capsule Take 40 mg by mouth daily.    [provider]  Eszopiclone (ESZOPICLONE) 3 MG TABS Take 1 tablet (3 mg total) by mouth at bedtime as needed. Take immediately before bedtime 02/20/21 02/20/22  [place] Crane, MD  ferrous sulfate 325 (65 FE) MG tablet Take 1 tablet (325 mg total) by mouth 2 (two) times daily with a meal. 01/02/19   Vaughan Basta, MD  fluticasone (FLONASE) 50 MCG/ACT nasal spray Place 2 sprays into both nostrils daily as needed for allergies. 12/30/18   [provider]  Fluticasone-Umeclidin-Vilant 100-62.5-25 MCG/INH AEPB Inhale 1 puff into the lungs daily. [place] 11/28/18   [provider]  gabapentin (NEURONTIN) 600 MG tablet Take 1 tablet (600 mg total) by mouth 3 (three) times daily. 02/20/21   [place] Crane, MD  ipratropium (ATROVENT) 0.02 % nebulizer solution  Take 2.5 mLs (0.5 mg total) by nebulization every 6 (six) hours as needed for wheezing or shortness of breath. 02/26/17   Fritzi Mandes, MD  metFORMIN (GLUCOPHAGE) 500 MG tablet Take 500 mg by mouth daily with breakfast.     [provider]  metoprolol tartrate (LOPRESSOR) 25 MG tablet Take 25 mg by mouth 2 (two) times daily.     [provider]  Multiple Vitamin (MULTIVITAMIN) tablet Take 1 tablet by mouth daily.    [provider]  OXYGEN Inhale 2 L into the lungs continuous.    [provider]  potassium chloride (KLOR-CON) 8 MEQ tablet Take 8 mEq by mouth daily.     [provider]  pravastatin (PRAVACHOL) 80 MG tablet Take 80 mg by mouth every evening.     [provider]  spironolactone (ALDACTONE) 25 MG tablet Take 25 mg by mouth daily.     [provider]  venlafaxine XR (EFFEXOR-XR) 150 MG 24 hr capsule Take 1 capsule (150 mg total) by mouth daily with breakfast. 02/20/21   [place] Crane, MD   Physical  Exam: Vitals:   03/19/21 1542 03/19/21 1545 03/19/21 1600 03/19/21 1700  BP: 126/60  125/67 112/87  Pulse: 92 92 91 91  Resp: (!) 28 (!) 23 (!) 24 (!) 36  Temp:      TempSrc:      SpO2: 97% 98% 99% 99%  Weight:      Height:       Constitutional: appears older than chronological age, NAD, calm, comfortable Eyes: PERRL, lids and conjunctivae normal ENMT: Mucous membranes are moist. Posterior pharynx clear of any exudate or lesions. Age-appropriate dentition. Hearing appropriate Neck: normal, supple, no masses, no thyromegaly Respiratory: + wheezing, coughing. Increased respiratory effort. Increased accessory muscle use.  Cardiovascular: Regular rate and rhythm, no murmurs / rubs / gallops. No extremity edema. 2+ pedal pulses. No carotid bruits.  Abdomen: obese abdomen, no tenderness, no masses palpated, no hepatosplenomegaly. Bowel sounds positive.  Musculoskeletal: no clubbing / cyanosis. No joint deformity upper and lower extremities. Good ROM, no contractures, no atrophy. Normal muscle tone.  Skin: no rashes, lesions, ulcers. No induration Neurologic: Sensation intact. Strength 5/5 in all 4.  Psychiatric: Normal judgment and insight. Alert and oriented x 3. Normal mood.   EKG: independently reviewed, showing sinus rhythm, rate of 94, QTc 440, occasional PVC  Chest x-ray on Admission: I personally reviewed and I agree with radiologist reading as below.  DG Chest 2 View  Result Date: 03/19/2021 CLINICAL DATA:  Shortness of breath EXAM: CHEST - 2 VIEW COMPARISON:  July 16, 2019 chest radiograph; chest CT Feb 27, 2021 FINDINGS: There is atelectatic change in the left mid lung. The lungs elsewhere are clear. The heart size and pulmonary vascularity are normal. No adenopathy. There is aortic atherosclerosis. There is postop change in the lower cervical region. IMPRESSION: Left midlung atelectasis. Lungs elsewhere clear. Cardiac silhouette within normal limits. Aortic Atherosclerosis  (ICD10-I70.0). Electronically Signed   By: Lowella Grip III M.D.   On: 03/19/2021 13:32   CT Chest Wo Contrast  Result Date: 03/19/2021 CLINICAL DATA:  Persistent cough, shortness of breath, history of lung cancer EXAM: CT CHEST WITHOUT CONTRAST TECHNIQUE: Multidetector CT imaging of the chest was performed following the standard protocol without IV contrast. COMPARISON:  02/27/2021 FINDINGS: Cardiovascular: Aortic atherosclerosis. Normal heart size. Three-vessel coronary artery calcifications. No pericardial effusion. Mediastinum/Nodes: Slight interval enlargement of multiple mediastinal lymph nodes, subcarinal lymph node measuring 2.7 x  2.2 cm, previously 2.4 x 1.7 cm (series 2, image 65). Thyroid gland, trachea, and esophagus demonstrate no significant findings. Lungs/Pleura: Diffuse bilateral bronchial wall thickening fall with scattered bronchial plugging. There has been a very substantial interval worsening of diffuse centrilobular and tree-in-bud pulmonary nodularity throughout the lungs. Unchanged, predominantly bandlike post treatment appearance of a lingular nodule. No pleural effusion or pneumothorax. Upper Abdomen: No acute abnormality. Musculoskeletal: No chest wall mass or suspicious bone lesions identified. IMPRESSION: 1. There has been a very substantial interval worsening of diffuse centrilobular and tree-in-bud pulmonary nodularity throughout the lungs, consistent with worsened atypical infection, particularly atypical Mycobacterium. 2. Diffuse bilateral bronchial wall thickening and scattered bronchial plugging, consistent with nonspecific infectious or inflammatory bronchitis. 3. Slight interval enlargement of multiple mediastinal lymph nodes, likely reactive to infection. 4. Unchanged, predominantly bandlike post treatment appearance of a lingular nodule compared to recent staging CT. 5. Coronary artery disease. Aortic Atherosclerosis (ICD10-I70.0). Electronically Signed   By: Eddie Candle M.D.   On: 03/19/2021 15:57   Labs on Admission: I have personally reviewed following labs  CBC: Recent Labs  Lab 03/19/21 1257  WBC 23.3*  HGB 12.6  HCT 39.5  MCV 94.3  PLT 998   Basic Metabolic Panel: Recent Labs  Lab 03/19/21 1257  NA 137  K 4.2  CL 99  CO2 28  GLUCOSE 166*  BUN 17  CREATININE 0.74  CALCIUM 9.1   GFR: Estimated Creatinine Clearance: 73.3 mL/min (by C-G formula based on SCr of 0.74 mg/dL).  Urine analysis:    Component Value Date/Time   COLORURINE YELLOW (A) 02/24/2017 2306   APPEARANCEUR HAZY (A) 02/24/2017 2306   LABSPEC 1.012 02/24/2017 2306   PHURINE 5.0 02/24/2017 2306   GLUCOSEU NEGATIVE 02/24/2017 2306   HGBUR SMALL (A) 02/24/2017 2306   BILIRUBINUR NEGATIVE 02/24/2017 2306   KETONESUR 20 (A) 02/24/2017 2306   PROTEINUR 30 (A) 02/24/2017 2306   NITRITE NEGATIVE 02/24/2017 2306   LEUKOCYTESUR NEGATIVE 02/24/2017 2306   Suzanne Glenn N Rojelio Uhrich D.O. [facility]  If 7PM-7AM, please contact overnight-coverage provider If 7AM-7PM, please contact day coverage provider [email or web address]  03/19/2021, 5:16 PM

## 2021-03-20 DIAGNOSIS — Z20822 Contact with and (suspected) exposure to covid-19: Secondary | ICD-10-CM | POA: Diagnosis present

## 2021-03-20 DIAGNOSIS — K5900 Constipation, unspecified: Secondary | ICD-10-CM | POA: Diagnosis not present

## 2021-03-20 DIAGNOSIS — J439 Emphysema, unspecified: Secondary | ICD-10-CM | POA: Diagnosis present

## 2021-03-20 DIAGNOSIS — J9621 Acute and chronic respiratory failure with hypoxia: Secondary | ICD-10-CM | POA: Diagnosis present

## 2021-03-20 DIAGNOSIS — Z7984 Long term (current) use of oral hypoglycemic drugs: Secondary | ICD-10-CM | POA: Diagnosis not present

## 2021-03-20 DIAGNOSIS — F411 Generalized anxiety disorder: Secondary | ICD-10-CM | POA: Diagnosis present

## 2021-03-20 DIAGNOSIS — F331 Major depressive disorder, recurrent, moderate: Secondary | ICD-10-CM | POA: Diagnosis not present

## 2021-03-20 DIAGNOSIS — E7800 Pure hypercholesterolemia, unspecified: Secondary | ICD-10-CM | POA: Diagnosis present

## 2021-03-20 DIAGNOSIS — Z923 Personal history of irradiation: Secondary | ICD-10-CM | POA: Diagnosis not present

## 2021-03-20 DIAGNOSIS — F17210 Nicotine dependence, cigarettes, uncomplicated: Secondary | ICD-10-CM | POA: Diagnosis present

## 2021-03-20 DIAGNOSIS — G20 Parkinson's disease: Secondary | ICD-10-CM | POA: Diagnosis present

## 2021-03-20 DIAGNOSIS — Z9221 Personal history of antineoplastic chemotherapy: Secondary | ICD-10-CM | POA: Diagnosis not present

## 2021-03-20 DIAGNOSIS — I10 Essential (primary) hypertension: Secondary | ICD-10-CM | POA: Diagnosis present

## 2021-03-20 DIAGNOSIS — E785 Hyperlipidemia, unspecified: Secondary | ICD-10-CM | POA: Diagnosis present

## 2021-03-20 DIAGNOSIS — Z7982 Long term (current) use of aspirin: Secondary | ICD-10-CM | POA: Diagnosis not present

## 2021-03-20 DIAGNOSIS — G2581 Restless legs syndrome: Secondary | ICD-10-CM | POA: Diagnosis present

## 2021-03-20 DIAGNOSIS — Z79899 Other long term (current) drug therapy: Secondary | ICD-10-CM | POA: Diagnosis not present

## 2021-03-20 DIAGNOSIS — R0602 Shortness of breath: Secondary | ICD-10-CM | POA: Diagnosis not present

## 2021-03-20 DIAGNOSIS — I2510 Atherosclerotic heart disease of native coronary artery without angina pectoris: Secondary | ICD-10-CM | POA: Diagnosis present

## 2021-03-20 DIAGNOSIS — J189 Pneumonia, unspecified organism: Secondary | ICD-10-CM | POA: Diagnosis present

## 2021-03-20 DIAGNOSIS — K219 Gastro-esophageal reflux disease without esophagitis: Secondary | ICD-10-CM | POA: Diagnosis present

## 2021-03-20 DIAGNOSIS — Z9851 Tubal ligation status: Secondary | ICD-10-CM | POA: Diagnosis not present

## 2021-03-20 DIAGNOSIS — M797 Fibromyalgia: Secondary | ICD-10-CM | POA: Diagnosis present

## 2021-03-20 DIAGNOSIS — J9611 Chronic respiratory failure with hypoxia: Secondary | ICD-10-CM | POA: Diagnosis not present

## 2021-03-20 DIAGNOSIS — Z9981 Dependence on supplemental oxygen: Secondary | ICD-10-CM | POA: Diagnosis not present

## 2021-03-20 DIAGNOSIS — Z955 Presence of coronary angioplasty implant and graft: Secondary | ICD-10-CM | POA: Diagnosis not present

## 2021-03-20 DIAGNOSIS — E1140 Type 2 diabetes mellitus with diabetic neuropathy, unspecified: Secondary | ICD-10-CM | POA: Diagnosis present

## 2021-03-20 LAB — BASIC METABOLIC PANEL
Anion gap: 9 (ref 5–15)
BUN: 30 mg/dL — ABNORMAL HIGH (ref 8–23)
CO2: 30 mmol/L (ref 22–32)
Calcium: 9 mg/dL (ref 8.9–10.3)
Chloride: 98 mmol/L (ref 98–111)
Creatinine, Ser: 1.08 mg/dL — ABNORMAL HIGH (ref 0.44–1.00)
GFR, Estimated: 56 mL/min — ABNORMAL LOW (ref >60)
Glucose, Bld: 153 mg/dL — ABNORMAL HIGH (ref 70–99)
Potassium: 5.1 mmol/L (ref 3.5–5.1)
Sodium: 137 mmol/L (ref 135–145)

## 2021-03-20 LAB — CBC
HCT: 39.7 % (ref 36.0–46.0)
Hemoglobin: 12.5 g/dL (ref 12.0–15.0)
MCH: 30 pg (ref 26.0–34.0)
MCHC: 31.5 g/dL (ref 30.0–36.0)
MCV: 95.2 fL (ref 80.0–100.0)
Platelets: 251 10*3/uL (ref 150–400)
RBC: 4.17 MIL/uL (ref 3.87–5.11)
RDW: 14.8 % (ref 11.5–15.5)
WBC: 20.3 10*3/uL — ABNORMAL HIGH (ref 4.0–10.5)
nRBC: 0 % (ref 0.0–0.2)

## 2021-03-20 LAB — BRAIN NATRIURETIC PEPTIDE: B Natriuretic Peptide: 505.5 pg/mL — ABNORMAL HIGH (ref 0.0–100.0)

## 2021-03-20 LAB — HIV ANTIBODY (ROUTINE TESTING W REFLEX): HIV Screen 4th Generation wRfx: NONREACTIVE

## 2021-03-20 MED ORDER — GABAPENTIN 400 MG PO CAPS
400.0000 mg | ORAL_CAPSULE | Freq: Three times a day (TID) | ORAL | Status: DC
  Administered 2021-03-20 – 2021-03-23 (×9): 400 mg via ORAL
  Filled 2021-03-20: qty 1
  Filled 2021-03-20: qty 4
  Filled 2021-03-20 (×7): qty 1

## 2021-03-20 MED ORDER — METHYLPREDNISOLONE SODIUM SUCC 40 MG IJ SOLR
40.0000 mg | Freq: Two times a day (BID) | INTRAMUSCULAR | Status: DC
  Administered 2021-03-20 – 2021-03-22 (×5): 40 mg via INTRAVENOUS
  Filled 2021-03-20 (×5): qty 1

## 2021-03-20 NOTE — Progress Notes (Signed)
PROGRESS NOTE    SHARAN MCENANEY  STM:[id number] DOB: December 06, 1950 DOA: 03/19/2021 PCP: Tracie Harrier, MD   Brief Narrative:   70 y.o. 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  She presents emergency department for chief concerns of worsening shortness of breath and productive cough.  She states that the shortness of breath started AM of 03/18/21.  She reports that the green sputum started on 03/17/2021.  She endorses that she takes care of her 16 year old grandson, who has been having a sore throat and cough.    Assessment & Plan:   Principal Problem:   Atypical pneumonia Active Problems:   Fibromyalgia   Coronary artery disease   Benign essential HTN   COPD, moderate (HCC)   Clinical depression   Gastro-esophageal reflux disease without esophagitis   GAD (generalized anxiety disorder)   Depression, major, recurrent, moderate (HCC)   Essential (primary) hypertension   Pure hypercholesterolemia   Restless leg syndrome   Tobacco use   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HCC)   Chronic respiratory failure with hypoxia (HCC)   MDD (major depressive disorder), recurrent, in full remission ([place])  Atypical pneumonia Acute on chronic hypoxic respiratory failure COPD with possible acute exacerbation Patient with complex pulmonary history Sees Dr. Raul Del as outpatient Chest CT with worsened tree-in-bud nodularity concerning for atypical infection On 3 L baseline at home, currently on 4 Patient on home  trilogy Plan: Continue CAP coverage Rocephin azithromycin for now Bronchodilator regimen Added short course intravenous steroids for wheezing Continue oxygen, wean as tolerated Pulmonary consultation requested, recommendations appreciated Counseled tobacco cessation Incentive spirometry and flutter valve use  Hypertension controlled, patient states that she took her antihypertensive medication prior to ED presentation Plan: Amlodipine 10 mg daily Metoprolol succinate tartrate 25 mg twice daily Aldactone 25 mg daily Vitals per unit protocol  Hyperlipidemia pravastatin 80 mg nightly  Major depressive disorder venlafaxine 150 mg p.o. daily  Non-insulin-dependent diabetes mellitus  metformin 500 mg p.o. daily with breakfast resumed  GERD  PPI  Neuropathy Patient endorses some numbness in the face Possibly attributed to high-dose gabapentin Plan: Decrease gabapentin from home dose of 600 3 times daily to 400 3 times daily  Parkinson disease resumed carbidopa-levodopa 25-100 mg p.o. daily 3 times daily  Tobacco abuse Counseled extensively on tobacco cessation Offered nicotine patch, patient declined States she is ready to stop smoking  History of squamous cell lung cancer No acute issues Completed chemotherapy/radiation in 2020   DVT prophylaxis: SQ Lovenox Code Status: Full Family Communication: None today Disposition Plan: Status is: Observation  The patient will require care spanning > 2 midnights and should be moved to inpatient because: Inpatient level of care appropriate due to severity of illness  Dispo: The patient is from: Home              Anticipated d/c is to: Home              Patient currently is not medically stable to d/c.   Difficult to place patient No  Complex pulmonary presentation.  Atypical infection versus COPD.  On antibiotics, IV steroids, bronchodilators.  Pulmonary consultation requested and pending.     Level of care:  Med-Surg  Consultants:   Pulmonary- Dr. Lanney Gins  Procedures:   None  Antimicrobials:   Ceftriaxone  Azithromycin   Subjective: Patient seen and examined.  No specific pain complaints.  States shortness of breath is improving.  Does endorse numbness in the face that she attributes to gabapentin  Objective: Vitals:   03/19/21 2023 03/19/21 2311 03/20/21 0428 03/20/21 0734  BP: (!) 149/72 116/79 126/76 118/78  Pulse: 100 83 66 79  Resp: (!) 22 20 18 18   Temp: 98.6 F (37 C) 98.6 F (37 C) 98 F (36.7 C) 97.8 F (36.6 C)  TempSrc: Oral Oral Oral Oral  SpO2: 92% 99% 97% 100%  Weight:      Height:        Intake/Output Summary (Last 24 hours) at 03/20/2021 1039 Last data filed at 03/20/2021 0305 Gross per 24 hour  Intake 240 ml  Output --  Net 240 ml   Filed Weights   03/19/21 1250  Weight: 93.9 kg    Examination:  General exam: No acute distress.  Appears chronically ill Respiratory system: Scattered crackles.  Bilateral end expiratory wheeze.  Normal work of breathing.  4 L Cardiovascular system: S1-S2, regular rate and rhythm, no murmurs Gastrointestinal system: Obese, nontender, nondistended, normal bowel sounds Central nervous system: Alert and oriented. No focal neurological deficits. Extremities: Symmetric 5 x 5 power. Skin: Pale, scattered excoriation, no obvious rashes or lesions Psychiatry: Judgement and insight appear normal. Mood & affect appropriate.     Data Reviewed: I have personally reviewed following labs and imaging studies  CBC: Recent Labs  Lab 03/19/21 1257 03/20/21 0458  WBC 23.3* 20.3*  HGB 12.6 12.5  HCT 39.5 39.7  MCV 94.3 95.2  PLT 255 122   Basic Metabolic Panel: Recent Labs  Lab 03/19/21 1257 03/20/21 0458  NA 137 137  K 4.2 5.1  CL 99 98  CO2 28 30  GLUCOSE 166* 153*  BUN 17 30*  CREATININE 0.74 1.08*  CALCIUM 9.1 9.0   GFR: Estimated Creatinine Clearance: 54.3 mL/min (A) (by C-G formula based on SCr of  1.08 mg/dL (H)). Liver Function Tests: No results for input(s): AST, ALT, ALKPHOS, BILITOT, PROT, ALBUMIN in the last 168 hours. No results for input(s): LIPASE, AMYLASE in the last 168 hours. No results for input(s): AMMONIA in the last 168 hours. Coagulation Profile: No results for input(s): INR, PROTIME in the last 168 hours. Cardiac Enzymes: No results for input(s): CKTOTAL, CKMB, CKMBINDEX, TROPONINI in the last 168 hours. BNP (last 3 results) No results for input(s): PROBNP in the last 8760 hours. HbA1C: No results for input(s): HGBA1C in the last 72 hours. CBG: No results for input(s): GLUCAP in the last 168 hours. Lipid Profile: No results for input(s): CHOL, HDL, LDLCALC, TRIG, CHOLHDL, LDLDIRECT in the last 72 hours. Thyroid Function Tests: No results for input(s): TSH, T4TOTAL, FREET4, T3FREE, THYROIDAB in the last 72 hours. Anemia Panel: No results for input(s): VITAMINB12, FOLATE, FERRITIN, TIBC, IRON, RETICCTPCT in the last 72 hours. Sepsis Labs: Recent Labs  Lab 03/19/21 1642  LATICACIDVEN 1.6    Recent Results (from the past 240 hour(s))  Resp Panel by RT-PCR (Flu A&B, Covid) Nasopharyngeal Swab     Status: None   Collection Time: 03/19/21  1:59 PM   Specimen: Nasopharyngeal Swab; Nasopharyngeal(NP) swabs in vial transport medium  Result Value Ref Range Status   SARS Coronavirus 2 by RT PCR  NEGATIVE NEGATIVE Final    Comment: (NOTE) SARS-CoV-2 target nucleic acids are NOT DETECTED.  The SARS-CoV-2 RNA is generally detectable in upper respiratory specimens during the acute phase of infection. The lowest concentration of SARS-CoV-2 viral copies this assay can detect is 138 copies/mL. A negative result does not preclude SARS-Cov-2 infection and should not be used as the sole basis for treatment or other patient management decisions. A negative result may occur with  improper specimen collection/handling, submission of specimen other than nasopharyngeal swab,  presence of viral mutation(s) within the areas targeted by this assay, and inadequate number of viral copies(<138 copies/mL). A negative result must be combined with clinical observations, patient history, and epidemiological information. The expected result is Negative.  Fact Sheet for Patients:  EntrepreneurPulse.com.au  Fact Sheet for Healthcare Providers:  IncredibleEmployment.be  This test is no t yet approved or cleared by the [place] FDA and  has been authorized for detection and/or diagnosis of SARS-CoV-2 by FDA under an Emergency Use Authorization (EUA). This EUA will remain  in effect (meaning this test can be used) for the duration of the COVID-19 declaration under Section 564(b)(1) of the Act, 21 U.S.C.section 360bbb-3(b)(1), unless the authorization is terminated  or revoked sooner.       Influenza A by PCR NEGATIVE NEGATIVE Final   Influenza B by PCR NEGATIVE NEGATIVE Final    Comment: (NOTE) The Xpert Xpress SARS-CoV-2/FLU/RSV plus assay is intended as an aid in the diagnosis of influenza from Nasopharyngeal swab specimens and should not be used as a sole basis for treatment. Nasal washings and aspirates are unacceptable for Xpert Xpress SARS-CoV-2/FLU/RSV testing.  Fact Sheet for Patients: EntrepreneurPulse.com.au  Fact Sheet for Healthcare Providers: IncredibleEmployment.be  This test is not yet approved or cleared by the [place] FDA and has been authorized for detection and/or diagnosis of SARS-CoV-2 by FDA under an Emergency Use Authorization (EUA). This EUA will remain in effect (meaning this test can be used) for the duration of the COVID-19 declaration under Section 564(b)(1) of the Act, 21 U.S.C. section 360bbb-3(b)(1), unless the authorization is terminated or revoked.  Performed at [facility], [address] [place], [place] [address]   Blood culture  (routine x 2)     Status: None (Preliminary result)   Collection Time: 03/19/21  4:42 PM   Specimen: BLOOD  Result Value Ref Range Status   Specimen Description BLOOD LEFT AC  Final   Special Requests   Final    BOTTLES DRAWN AEROBIC AND ANAEROBIC Blood Culture results may not be optimal due to an inadequate volume of blood received in culture bottles   Culture   Final    NO GROWTH < 24 HOURS Performed at [facility], [address] [place], [place] [address]    Report Status PENDING  Incomplete  Blood culture (routine x 2)     Status: None (Preliminary result)   Collection Time: 03/19/21  4:42 PM   Specimen: BLOOD  Result Value Ref Range Status   Specimen Description BLOOD LEFT AC  Final   Special Requests   Final    BOTTLES DRAWN AEROBIC AND ANAEROBIC Blood Culture adequate volume   Culture   Final    NO GROWTH < 24 HOURS Performed at [facility], [address] [place],  [address]    Report Status PENDING  Incomplete         Radiology Studies: DG Chest 2 View  Result Date: 03/19/2021 CLINICAL DATA:  Shortness of breath EXAM: CHEST - 2 VIEW COMPARISON:  July 16, 2019 chest radiograph; chest CT Feb 27, 2021 FINDINGS: There is atelectatic change in the left mid lung. The lungs elsewhere are clear. The heart size and pulmonary vascularity are normal. No adenopathy. There is aortic atherosclerosis. There is postop change in the lower cervical region. IMPRESSION: Left midlung atelectasis. Lungs elsewhere clear. Cardiac silhouette within normal limits. Aortic Atherosclerosis (ICD10-I70.0). Electronically Signed   By: Lowella Grip III M.D.   On: 03/19/2021 13:32   CT Chest Wo Contrast  Result Date: 03/19/2021 CLINICAL DATA:  Persistent cough, shortness of breath, history of lung cancer EXAM: CT CHEST WITHOUT CONTRAST TECHNIQUE: Multidetector CT imaging of the chest was performed following the standard protocol without IV contrast. COMPARISON:   02/27/2021 FINDINGS: Cardiovascular: Aortic atherosclerosis. Normal heart size. Three-vessel coronary artery calcifications. No pericardial effusion. Mediastinum/Nodes: Slight interval enlargement of multiple mediastinal lymph nodes, subcarinal lymph node measuring 2.7 x 2.2 cm, previously 2.4 x 1.7 cm (series 2, image 65). Thyroid gland, trachea, and esophagus demonstrate no significant findings. Lungs/Pleura: Diffuse bilateral bronchial wall thickening fall with scattered bronchial plugging. There has been a very substantial interval worsening of diffuse centrilobular and tree-in-bud pulmonary nodularity throughout the lungs. Unchanged, predominantly bandlike post treatment appearance of a lingular nodule. No pleural effusion or pneumothorax. Upper Abdomen: No acute abnormality. Musculoskeletal: No chest wall mass or suspicious bone lesions identified. IMPRESSION: 1. There has been a very substantial interval worsening of diffuse centrilobular and tree-in-bud pulmonary nodularity throughout the lungs, consistent with worsened atypical infection, particularly atypical Mycobacterium. 2. Diffuse bilateral bronchial wall thickening and scattered bronchial plugging, consistent with nonspecific infectious or inflammatory bronchitis. 3. Slight interval enlargement of multiple mediastinal lymph nodes, likely reactive to infection. 4. Unchanged, predominantly bandlike post treatment appearance of a lingular nodule compared to recent staging CT. 5. Coronary artery disease. Aortic Atherosclerosis (ICD10-I70.0). Electronically Signed   By: Eddie Candle M.D.   On: 03/19/2021 15:57        Scheduled Meds: . amLODipine  10 mg Oral Daily  . aspirin  81 mg Oral Daily  . carbidopa-levodopa  1 tablet Oral TID  . enoxaparin (LOVENOX) injection  0.5 mg/kg Subcutaneous Q24H  . fluticasone furoate-vilanterol  1 puff Inhalation Daily   And  . umeclidinium bromide  1 puff Inhalation Daily  . gabapentin  600 mg Oral TID  .  metFORMIN  500 mg Oral Q breakfast  . methylPREDNISolone (SOLU-MEDROL) injection  40 mg Intravenous Q12H  . metoprolol tartrate  25 mg Oral BID  . multivitamin with minerals  1 tablet Oral Daily  . pantoprazole  80 mg Oral Q1200  . pravastatin  80 mg Oral QHS  . spironolactone  25 mg Oral Daily  . venlafaxine XR  150 mg Oral Q breakfast  . cyanocobalamin  1,000 mcg Oral Daily   Continuous Infusions: . azithromycin    . cefTRIAXone (ROCEPHIN)  IV       LOS: 0 days    Time spent: 35 minutes    Sidney Ace, MD [facility] Pager [phone]  If 7PM-7AM, please contact night-coverage 03/20/2021, 10:39 AM

## 2021-03-20 NOTE — Consult Note (Signed)
Pulmonary Medicine            CHIEF COMPLAINT:   Abnormal chest ct scan    HISTORY OF PRESENT ILLNESS   This is a 70 yr old lady came with cough and worsening cough. No fever or chills. No recent travel. 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 Since being here she is feeling better on present regimen. Chest cts reviewed.    PAST MEDICAL HISTORY   Past Medical History:  Diagnosis Date  . Anxiety   . Asthma   . Cancer ([place]) 12/2018   w/u for right upper lobe mass/cancer  . COPD (chronic obstructive pulmonary disease) (HCC)    also, emphysema. now using o2 via np 24 hours a day  . Coronary artery disease   . Depression   . Diabetes mellitus, type II ([place])   . GERD (gastroesophageal reflux disease)   . Headache    migraines in early 20's  . Heart murmur   . Hypertension   . Lung cancer ([place])   . Neuropathy   . Restless leg      SURGICAL HISTORY   Past Surgical History:  Procedure Laterality Date  . BACK SURGERY  2005   surgery x 2, disc fused in neck, pinched nerve in center of back and neck  . CARDIAC CATHETERIZATION  2015   1 stent placed for blockage  . cervical bone infusion    . CHOLECYSTECTOMY    . THORACIC DISC SURGERY    . TUBAL LIGATION    . VIDEO BRONCHOSCOPY WITH ENDOBRONCHIAL ULTRASOUND N/A 03/06/2019   Procedure: VIDEO BRONCHOSCOPY WITH ENDOBRONCHIAL ULTRASOUND - DIABETIC;  Surgeon: Ottie Glazier, MD;  Location: [facility] ORS;  Service: Thoracic;  Laterality: N/A;     FAMILY HISTORY   Family History  Problem Relation Age of Onset  . Anxiety disorder Mother   . Cancer - Lung Mother   . Depression Sister   . Cancer Sister   . Depression Brother   . Cancer Sister   . Cancer Sister   . Cancer Sister   . Heart Problems Sister   . Bipolar disorder Sister   . Diabetes  Sister   . Cancer - Lung Sister   . Hypertension Sister   . Diabetes Sister   . Hyperlipidemia Sister   . COPD Sister   . Heart Problems Brother   . Cancer Brother   . Cancer Brother   . Cancer Brother   . Breast cancer Maternal Aunt      SOCIAL HISTORY   Social History   Tobacco Use  . Smoking status: Current Every Day Smoker    Packs/day: 1.00    Years: 40.00    Pack years: 40.00    Types: Cigarettes    Start date: 05/05/1975  . Smokeless tobacco: Never Used  Vaping Use  . Vaping Use: Never used  Substance Use Topics  . Alcohol use: No    Alcohol/week: 0.0 standard drinks    Comment: socially  . Drug use: No     MEDICATIONS    Home Medication:    Current Medication:  Current Facility-Administered Medications:  .  acetaminophen (TYLENOL) tablet 650 mg, 650 mg, Oral, Q6H PRN **OR** acetaminophen (TYLENOL) suppository 650 mg, 650 mg, Rectal, Q6H PRN, Cox, Amy N, DO .  albuterol (PROVENTIL) (  2.5 MG/3ML) 0.083% nebulizer solution 2.5 mg, 2.5 mg, Nebulization, Q6H PRN, Cox, Amy N, DO, 2.5 mg at 03/20/21 0536 .  amLODipine (NORVASC) tablet 10 mg, 10 mg, Oral, Daily, Cox, Amy N, DO, 10 mg at 03/20/21 0932 .  aspirin chewable tablet 81 mg, 81 mg, Oral, Daily, Cox, Amy N, DO, 81 mg at 03/20/21 0932 .  azithromycin (ZITHROMAX) 500 mg in sodium chloride 0.9 % 250 mL IVPB, 500 mg, Intravenous, Q24H, Cox, Amy N, DO .  carbidopa-levodopa (SINEMET IR) 25-100 MG per tablet immediate release 1 tablet, 1 tablet, Oral, TID, Cox, Amy N, DO, 1 tablet at 03/20/21 1615 .  cefTRIAXone (ROCEPHIN) 2 g in sodium chloride 0.9 % 100 mL IVPB, 2 g, Intravenous, Q24H, Cox, Amy N, DO .  enoxaparin (LOVENOX) injection 47.5 mg, 0.5 mg/kg, Subcutaneous, Q24H, Cox, Amy N, DO, 47.5 mg at 03/19/21 2123 .  fluticasone (FLONASE) 50 MCG/ACT nasal spray 2 spray, 2 spray, Each Nare, Daily PRN, Cox, Amy N, DO .  fluticasone furoate-vilanterol (BREO ELLIPTA) 100-25 MCG/INH 1 puff, 1 puff, Inhalation, Daily, 1  puff at 03/20/21 0941 **AND** umeclidinium bromide (INCRUSE ELLIPTA) 62.5 MCG/INH 1 puff, 1 puff, Inhalation, Daily, Dorothe Pea, RPH, 1 puff at 03/20/21 0941 .  gabapentin (NEURONTIN) capsule 400 mg, 400 mg, Oral, TID, Priscella Mann, Sudheer B, MD, 400 mg at 03/20/21 1615 .  guaiFENesin-dextromethorphan (ROBITUSSIN DM) 100-10 MG/5ML syrup 5 mL, 5 mL, Oral, Q4H PRN, Cox, Amy N, DO, 5 mL at 03/20/21 1307 .  melatonin tablet 5 mg, 5 mg, Oral, QHS PRN, Dwyane Dee, MD, 5 mg at 03/19/21 2311 .  metFORMIN (GLUCOPHAGE) tablet 500 mg, 500 mg, Oral, Q breakfast, Cox, Amy N, DO, 500 mg at 03/20/21 0933 .  methylPREDNISolone sodium succinate (SOLU-MEDROL) 40 mg/mL injection 40 mg, 40 mg, Intravenous, Q12H, Sreenath, Sudheer B, MD, 40 mg at 03/20/21 0941 .  metoprolol tartrate (LOPRESSOR) tablet 25 mg, 25 mg, Oral, BID, Cox, Amy N, DO, 25 mg at 03/20/21 0933 .  multivitamin with minerals tablet 1 tablet, 1 tablet, Oral, Daily, Cox, Amy N, DO, 1 tablet at 03/20/21 0933 .  ondansetron (ZOFRAN) tablet 4 mg, 4 mg, Oral, Q6H PRN **OR** ondansetron (ZOFRAN) injection 4 mg, 4 mg, Intravenous, Q6H PRN, Cox, Amy N, DO .  pantoprazole (PROTONIX) EC tablet 80 mg, 80 mg, Oral, Q1200, Cox, Amy N, DO, 80 mg at 03/20/21 1307 .  pravastatin (PRAVACHOL) tablet 80 mg, 80 mg, Oral, QHS, Cox, Amy N, DO, 80 mg at 03/19/21 2123 .  venlafaxine XR (EFFEXOR-XR) 24 hr capsule 150 mg, 150 mg, Oral, Q breakfast, Cox, Amy N, DO, 150 mg at 03/20/21 0932 .  vitamin B-12 (CYANOCOBALAMIN) tablet 1,000 mcg, 1,000 mcg, Oral, Daily, Cox, Amy N, DO, 1,000 mcg at 03/20/21 0934    ALLERGIES   Diclofenac-misoprostol, Nsaids, Zolpidem, Hydrocodone-acetaminophen, and Simvastatin     REVIEW OF SYSTEMS    Review of Systems:  Gen:  Denies  fever, sweats, chills weigh loss  HEENT: Denies blurred vision, double vision, ear pain, eye pain, hearing loss, nose bleeds, sore throat Cardiac:  No dizziness, chest pain or heaviness, chest  tightness,edema Resp:   Denies cough or sputum porduction, shortness of breath,wheezing, hemoptysis,  Gi: Denies swallowing difficulty, stomach pain, nausea or vomiting, diarrhea, constipation, bowel incontinence Gu:  Denies bladder incontinence, burning urine Ext:   Denies Joint pain, stiffness or swelling Skin: Denies  skin rash, easy bruising or bleeding or hives Endoc:  Denies polyuria, polydipsia , polyphagia or  weight change Psych:   Denies depression, insomnia or hallucinations   Other:  All other systems negative   VS: BP (!) 116/91 (BP Location: Left Arm)   Pulse 88   Temp 98.5 F (36.9 C) (Oral)   Resp 18   Ht 5\' 3"  (1.6 m)   Wt 93.9 kg   SpO2 95%   BMI 36.67 kg/m      PHYSICAL EXAM    GENERAL:NAD, no fevers, chills, no weakness no fatigue HEAD: Normocephalic, atraumatic.  EYES: Pupils equal, round, reactive to light. Extraocular muscles intact. No scleral icterus.  MOUTH: Moist mucosal membrane. Dentition intact. No abscess noted.  EAR, NOSE, THROAT: Clear without exudates. No external lesions.  NECK: Supple. No thyromegaly. No nodules. No JVD.  PULMONARY: Diffuse coarse rhonchi right sided +wheezes CARDIOVASCULAR: S1 and S2. Regular rate and rhythm. No murmurs, rubs, or gallops. No edema. Pedal pulses 2+ bilaterally.  GASTROINTESTINAL: Soft, nontender, nondistended. No masses. Positive bowel sounds. No hepatosplenomegaly.  MUSCULOSKELETAL: No swelling, clubbing, or edema. Range of motion full in all extremities.  NEUROLOGIC: Cranial nerves II through XII are intact. No gross focal neurological deficits. Sensation intact. Reflexes intact.  SKIN: No ulceration, lesions, rashes, or cyanosis. Skin warm and dry. Turgor intact.  PSYCHIATRIC: Mood, affect within normal limits. The patient is awake, alert and oriented x 3. Insight, judgment intact.       IMAGING   DG Chest 2 View  Result Date: 03/19/2021 CLINICAL DATA:  Shortness of breath EXAM: CHEST - 2 VIEW  COMPARISON:  July 16, 2019 chest radiograph; chest CT Feb 27, 2021 FINDINGS: There is atelectatic change in the left mid lung. The lungs elsewhere are clear. The heart size and pulmonary vascularity are normal. No adenopathy. There is aortic atherosclerosis. There is postop change in the lower cervical region. IMPRESSION: Left midlung atelectasis. Lungs elsewhere clear. Cardiac silhouette within normal limits. Aortic Atherosclerosis (ICD10-I70.0). Electronically Signed   By: Lowella Grip III M.D.   On: 03/19/2021 13:32   CT Chest Wo Contrast  Result Date: 03/19/2021 CLINICAL DATA:  Persistent cough, shortness of breath, history of lung cancer EXAM: CT CHEST WITHOUT CONTRAST TECHNIQUE: Multidetector CT imaging of the chest was performed following the standard protocol without IV contrast. COMPARISON:  02/27/2021 FINDINGS: Cardiovascular: Aortic atherosclerosis. Normal heart size. Three-vessel coronary artery calcifications. No pericardial effusion. Mediastinum/Nodes: Slight interval enlargement of multiple mediastinal lymph nodes, subcarinal lymph node measuring 2.7 x 2.2 cm, previously 2.4 x 1.7 cm (series 2, image 65). Thyroid gland, trachea, and esophagus demonstrate no significant findings. Lungs/Pleura: Diffuse bilateral bronchial wall thickening fall with scattered bronchial plugging. There has been a very substantial interval worsening of diffuse centrilobular and tree-in-bud pulmonary nodularity throughout the lungs. Unchanged, predominantly bandlike post treatment appearance of a lingular nodule. No pleural effusion or pneumothorax. Upper Abdomen: No acute abnormality. Musculoskeletal: No chest wall mass or suspicious bone lesions identified. IMPRESSION: 1. There has been a very substantial interval worsening of diffuse centrilobular and tree-in-bud pulmonary nodularity throughout the lungs, consistent with worsened atypical infection, particularly atypical Mycobacterium. 2. Diffuse bilateral  bronchial wall thickening and scattered bronchial plugging, consistent with nonspecific infectious or inflammatory bronchitis. 3. Slight interval enlargement of multiple mediastinal lymph nodes, likely reactive to infection. 4. Unchanged, predominantly bandlike post treatment appearance of a lingular nodule compared to recent staging CT. 5. Coronary artery disease. Aortic Atherosclerosis (ICD10-I70.0). Electronically Signed   By: Eddie Candle M.D.   On: 03/19/2021 15:57   CT Chest W Contrast  Result  Date: 02/28/2021 CLINICAL DATA:  Left non-small cell lung cancer restaging, status post chemotherapy and radiation EXAM: CT CHEST WITH CONTRAST TECHNIQUE: Multidetector CT imaging of the chest was performed during intravenous contrast administration. CONTRAST:  56mL OMNIPAQUE IOHEXOL 300 MG/ML  SOLN COMPARISON:  08/29/2020 FINDINGS: Cardiovascular: Aortic atherosclerosis. Normal heart size. Three-vessel coronary artery calcifications. No pericardial effusion. Mediastinum/Nodes: No enlarged mediastinal, hilar, or axillary lymph nodes. Thyroid gland, trachea, and esophagus demonstrate no significant findings. Lungs/Pleura: Mild centrilobular emphysema. Diffuse bilateral bronchial wall thickening. No significant interval change in post treatment appearance of a nodule of the lingula, measuring approximately 1.7 x 1.4 cm with adjacent bandlike scarring and fibrosis (series 3, image 73). There is some fluctuant centrilobular nodularity in the posterior lingula which is generally improved compared to prior examination. Unchanged 4 mm nodule of the left pulmonary apex (series 3, image 25). No pleural effusion or pneumothorax. Upper Abdomen: No acute abnormality. Musculoskeletal: No chest wall mass or suspicious bone lesions identified. IMPRESSION: 1. No significant interval change in post treatment appearance of a nodule of the lingula, measuring approximately 1.7 x 1.4 cm with adjacent bandlike scarring and fibrosis. 2.  There is some fluctuant centrilobular nodularity in the posterior lingula which is generally improved compared to prior examination, consistent with improved although ongoing atypical infection. 3. Unchanged 4 mm nodule of the left pulmonary apex, nonspecific, most likely infectious or inflammatory. Continued attention on follow-up. 4. Emphysema. 5. Diffuse bilateral bronchial wall thickening, consistent with nonspecific infectious or inflammatory bronchitis. 6. Coronary artery disease. Aortic Atherosclerosis (ICD10-I70.0) and Emphysema (ICD10-J43.9). Electronically Signed   By: Eddie Candle M.D.   On: 02/28/2021 08:22   IMPRESSION: 1. Clear interval progression of the left upper lobe pulmonary mass, now measuring 5.3 x 4.9 cm compared to 3.8 x 3.2 cm previously. Imaging features remain highly concerning for primary bronchogenic neoplasm. 2. Enlarged subcarinal lymph node seen previously has decreased slightly in size in the interval. 3.  Emphysema. (ICD10-J43.9) 4.  Aortic Atherosclerois (ICD10-170.0)   Electronically Signed   By: Misty Stanley M.D.   On: 04/09/2019 15:05    ASSESSMENT/PLAN   Chest ct is notable that the left lung (lingula) cancer is in remission.  Oncology following  There has been a very substantial interval worsening of diffuse centrilobular and tree-in-bud pulmonary nodularity throughout the lungs, consistent with worsened atypical infection, particularly atypical Mycobacterium, scant sputum. Will try to get sputum c/s. quantifern tb gold, urine legionella, mycoplasma, bnp. No invasive studies at this time. If improved to go home in am, ok to d/c with out patient f/u visit.  I agree the enlarged nodes are more reactive in nature.       Thank you for allowing me to participate in the care of this patient.   Patient/Family are satisfied with care plan and all questions have been answered.  This document was prepared using Dragon voice recognition software and  may include unintentional dictation errors.     Wallene Huh, M.D.  Division of [place]

## 2021-03-21 LAB — EXPECTORATED SPUTUM ASSESSMENT W GRAM STAIN, RFLX TO RESP C

## 2021-03-21 MED ORDER — GUAIFENESIN ER 600 MG PO TB12
600.0000 mg | ORAL_TABLET | Freq: Two times a day (BID) | ORAL | Status: DC
  Administered 2021-03-21 – 2021-03-23 (×5): 600 mg via ORAL
  Filled 2021-03-21 (×5): qty 1

## 2021-03-21 MED ORDER — ESZOPICLONE 3 MG PO TABS
3.0000 mg | ORAL_TABLET | Freq: Every evening | ORAL | Status: DC | PRN
  Administered 2021-03-21: 3 mg via ORAL
  Filled 2021-03-21 (×3): qty 1

## 2021-03-21 NOTE — Progress Notes (Signed)
PT Cancellation Note  Patient Details Name: Suzanne Glenn MRN: [id number] DOB: 1951/06/23   Cancelled Treatment:    Reason Eval/Treat Not Completed: PT screened, no needs identified, will sign off (Consult received and chart reviewed.  Per primary RN, patient just completed session with mobility specialist.  Mobility specialist confirms patient ambulatory without assist device, safe and steady throughout distance.  No acute PT needs identified at this time; will complete initial PT order, but maintain patient on caseload for mobility specialist to ensure progressive mobility throughout remaining hospitalization.  Please re-consult should needs change.)   Shonna Deiter H. Owens Shark, PT, DPT, NCS 03/21/21, 1:56 PM [phone]

## 2021-03-21 NOTE — Progress Notes (Signed)
Mobility Specialist - Progress Note   03/21/21 1300  Mobility  Activity Ambulated in hall  Level of Assistance Standby assist, set-up cues, supervision of patient - no hands on  Assistive Device None  Distance Ambulated (ft) 180 ft  Mobility Ambulated with assistance in hallway  Mobility Response Tolerated well  Mobility performed by Mobility specialist  $Mobility charge 1 Mobility    Pt supine using 4L on arrival. Cleared by RN to wean O2 as able. PLB educated and engaged. O2 desat to 90% while resting on 2L. Increased to 3L for OOB activity. Pt sat EOB independently. No dizziness. O2 desat to 81% during ambulation, O2 bumped up to 6L to rebound sats > 88%. Resting break initiated. Mild wheezing noted. No s/s of distress. Pt weaned back down to 4L sats at 93%, reamining in high 80s-low 90s remainder of session. RN entered at the end of session and was notified of performance.     Kathee Delton Mobility Specialist 03/21/21, 1:56 PM

## 2021-03-21 NOTE — Progress Notes (Signed)
Pulmonary Medicine            HISTORY OF PRESENT ILLNESS   Sitting up in bed, wheezing more today. May be due to the fact she is moving more air. sats up fio2 being weaned down. No new complaints.    PAST MEDICAL HISTORY   Past Medical History:  Diagnosis Date  . Anxiety   . Asthma   . Cancer ([place]) 12/2018   w/u for right upper lobe mass/cancer  . COPD (chronic obstructive pulmonary disease) (HCC)    also, emphysema. now using o2 via np 24 hours a day  . Coronary artery disease   . Depression   . Diabetes mellitus, type II ([place])   . GERD (gastroesophageal reflux disease)   . Headache    migraines in early 20's  . Heart murmur   . Hypertension   . Lung cancer ([place])   . Neuropathy   . Restless leg      SURGICAL HISTORY   Past Surgical History:  Procedure Laterality Date  . BACK SURGERY  2005   surgery x 2, disc fused in neck, pinched nerve in center of back and neck  . CARDIAC CATHETERIZATION  2015   1 stent placed for blockage  . cervical bone infusion    . CHOLECYSTECTOMY    . THORACIC DISC SURGERY    . TUBAL LIGATION    . VIDEO BRONCHOSCOPY WITH ENDOBRONCHIAL ULTRASOUND N/A 03/06/2019   Procedure: VIDEO BRONCHOSCOPY WITH ENDOBRONCHIAL ULTRASOUND - DIABETIC;  Surgeon: Ottie Glazier, MD;  Location: [facility] ORS;  Service: Thoracic;  Laterality: N/A;     FAMILY HISTORY   Family History  Problem Relation Age of Onset  . Anxiety disorder Mother   . Cancer - Lung Mother   . Depression Sister   . Cancer Sister   . Depression Brother   . Cancer Sister   . Cancer Sister   . Cancer Sister   . Heart Problems Sister   . Bipolar disorder Sister   . Diabetes Sister   . Cancer - Lung Sister   . Hypertension Sister   . Diabetes Sister   . Hyperlipidemia Sister   . COPD Sister   . Heart Problems Brother   . Cancer Brother   . Cancer Brother   . Cancer Brother   . Breast cancer Maternal Aunt      SOCIAL HISTORY   Social History   Tobacco  Use  . Smoking status: Current Every Day Smoker    Packs/day: 1.00    Years: 40.00    Pack years: 40.00    Types: Cigarettes    Start date: 05/05/1975  . Smokeless tobacco: Never Used  Vaping Use  . Vaping Use: Never used  Substance Use Topics  . Alcohol use: No    Alcohol/week: 0.0 standard drinks    Comment: socially  . Drug use: No     MEDICATIONS    Home Medication:    Current Medication:  Current Facility-Administered Medications:  .  acetaminophen (TYLENOL) tablet 650 mg, 650 mg, Oral, Q6H PRN **OR** acetaminophen (TYLENOL) suppository 650 mg, 650 mg, Rectal, Q6H PRN, Cox, Amy N, DO .  albuterol (PROVENTIL) (2.5 MG/3ML) 0.083% nebulizer solution 2.5 mg, 2.5 mg, Nebulization, Q6H PRN, Cox, Amy N, DO, 2.5 mg at 03/20/21 0536 .  amLODipine (NORVASC) tablet 10 mg, 10 mg, Oral, Daily, Cox, Amy N, DO, 10 mg at 03/21/21 0817 .  aspirin chewable tablet 81 mg, 81 mg, Oral, Daily,  Cox, Amy N, DO, 81 mg at 03/21/21 0816 .  azithromycin (ZITHROMAX) 500 mg in sodium chloride 0.9 % 250 mL IVPB, 500 mg, Intravenous, Q24H, Cox, Amy N, DO, Stopped at 03/20/21 1821 .  carbidopa-levodopa (SINEMET IR) 25-100 MG per tablet immediate release 1 tablet, 1 tablet, Oral, TID, Cox, Amy N, DO, 1 tablet at 03/21/21 1619 .  cefTRIAXone (ROCEPHIN) 2 g in sodium chloride 0.9 % 100 mL IVPB, 2 g, Intravenous, Q24H, Cox, Amy N, DO, Stopped at 03/20/21 1925 .  enoxaparin (LOVENOX) injection 47.5 mg, 0.5 mg/kg, Subcutaneous, Q24H, Cox, Amy N, DO, 47.5 mg at 03/20/21 2133 .  fluticasone (FLONASE) 50 MCG/ACT nasal spray 2 spray, 2 spray, Each Nare, Daily PRN, Cox, Amy N, DO .  fluticasone furoate-vilanterol (BREO ELLIPTA) 100-25 MCG/INH 1 puff, 1 puff, Inhalation, Daily, 1 puff at 03/21/21 0819 **AND** umeclidinium bromide (INCRUSE ELLIPTA) 62.5 MCG/INH 1 puff, 1 puff, Inhalation, Daily, Dorothe Pea, RPH, 1 puff at 03/21/21 0819 .  gabapentin (NEURONTIN) capsule 400 mg, 400 mg, Oral, TID, Priscella Mann, Sudheer B,  MD, 400 mg at 03/21/21 1619 .  guaiFENesin (MUCINEX) 12 hr tablet 600 mg, 600 mg, Oral, BID, Sreenath, Sudheer B, MD, 600 mg at 03/21/21 1206 .  guaiFENesin-dextromethorphan (ROBITUSSIN DM) 100-10 MG/5ML syrup 5 mL, 5 mL, Oral, Q4H PRN, Cox, Amy N, DO, 5 mL at 03/21/21 1323 .  melatonin tablet 5 mg, 5 mg, Oral, QHS PRN, Dwyane Dee, MD, 5 mg at 03/20/21 2132 .  metFORMIN (GLUCOPHAGE) tablet 500 mg, 500 mg, Oral, Q breakfast, Cox, Amy N, DO, 500 mg at 03/21/21 0817 .  methylPREDNISolone sodium succinate (SOLU-MEDROL) 40 mg/mL injection 40 mg, 40 mg, Intravenous, Q12H, Sreenath, Sudheer B, MD, 40 mg at 03/21/21 0819 .  metoprolol tartrate (LOPRESSOR) tablet 25 mg, 25 mg, Oral, BID, Cox, Amy N, DO, 25 mg at 03/21/21 0817 .  multivitamin with minerals tablet 1 tablet, 1 tablet, Oral, Daily, Cox, Amy N, DO, 1 tablet at 03/21/21 0817 .  ondansetron (ZOFRAN) tablet 4 mg, 4 mg, Oral, Q6H PRN **OR** ondansetron (ZOFRAN) injection 4 mg, 4 mg, Intravenous, Q6H PRN, Cox, Amy N, DO .  pantoprazole (PROTONIX) EC tablet 80 mg, 80 mg, Oral, Q1200, Cox, Amy N, DO, 80 mg at 03/21/21 1206 .  pravastatin (PRAVACHOL) tablet 80 mg, 80 mg, Oral, QHS, Cox, Amy N, DO, 80 mg at 03/20/21 2132 .  venlafaxine XR (EFFEXOR-XR) 24 hr capsule 150 mg, 150 mg, Oral, Q breakfast, Cox, Amy N, DO, 150 mg at 03/21/21 0816 .  vitamin B-12 (CYANOCOBALAMIN) tablet 1,000 mcg, 1,000 mcg, Oral, Daily, Cox, Amy N, DO, 1,000 mcg at 03/21/21 0818    ALLERGIES   Diclofenac-misoprostol, Nsaids, Zolpidem, Hydrocodone-acetaminophen, and Simvastatin     REVIEW OF SYSTEMS    Review of Systems:  Gen:  Denies  fever, sweats, chills weigh loss  HEENT: Denies blurred vision, double vision, ear pain, eye pain, hearing loss, nose bleeds, sore throat Cardiac:  No dizziness, chest pain or heaviness, chest tightness,edema Resp:   +cough or sputum porduction, + shortness of breath, +wheezing, hemoptysis,  Gi: Denies swallowing difficulty,  stomach pain, nausea or vomiting, diarrhea, constipation, bowel incontinence Gu:  Denies bladder incontinence, burning urine Ext:   Denies Joint pain, stiffness or swelling Skin: Denies  skin rash, easy bruising or bleeding or hives Endoc:  Denies polyuria, polydipsia , polyphagia or weight change Psych:   Denies depression, insomnia or hallucinations   Other:  All other systems negative   VS:  BP (!) 144/89 (BP Location: Left Arm)   Pulse 89   Temp 97.7 F (36.5 C) (Oral)   Resp 18   Ht 5\' 3"  (1.6 m)   Wt 93.9 kg   SpO2 90%   BMI 36.67 kg/m      PHYSICAL EXAM    GENERAL:over weight sitting up HEAD: Normocephalic, atraumatic.  EYES: Pupils equal, round, reactive to light. Extraocular muscles intact. No scleral icterus.  MOUTH: Moist mucosal membrane. Dentition intact. No abscess noted.  EAR, NOSE, THROAT: Clear without exudates. No external lesions.  NECK: Supple. No thyromegaly. No nodules. No JVD.  PULMONARY: Diffuse wheezes CARDIOVASCULAR: S1 and S2. Regular rate and rhythm. No murmurs, rubs, or gallops. No edema. Pedal pulses 2+ bilaterally.  GASTROINTESTINAL: Soft, nontender, nondistended. No masses. Positive bowel sounds. No hepatosplenomegaly.  MUSCULOSKELETAL: No swelling, clubbing, or edema. Range of motion full in all extremities.  NEUROLOGIC: Cranial nerves II through XII are intact. No gross focal neurological deficits. Sensation intact. Reflexes intact.  SKIN: No ulceration, lesions, rashes, or cyanosis. Skin warm and dry. Turgor intact.  PSYCHIATRIC: Mood, affect within normal limits. The patient is awake, alert and oriented x 3. Insight, judgment intact.       IMAGING    DG Chest 2 View  Result Date: 03/19/2021 CLINICAL DATA:  Shortness of breath EXAM: CHEST - 2 VIEW COMPARISON:  July 16, 2019 chest radiograph; chest CT Feb 27, 2021 FINDINGS: There is atelectatic change in the left mid lung. The lungs elsewhere are clear. The heart size and pulmonary  vascularity are normal. No adenopathy. There is aortic atherosclerosis. There is postop change in the lower cervical region. IMPRESSION: Left midlung atelectasis. Lungs elsewhere clear. Cardiac silhouette within normal limits. Aortic Atherosclerosis (ICD10-I70.0). Electronically Signed   By: Lowella Grip III M.D.   On: 03/19/2021 13:32   CT Chest Wo Contrast  Result Date: 03/19/2021 CLINICAL DATA:  Persistent cough, shortness of breath, history of lung cancer EXAM: CT CHEST WITHOUT CONTRAST TECHNIQUE: Multidetector CT imaging of the chest was performed following the standard protocol without IV contrast. COMPARISON:  02/27/2021 FINDINGS: Cardiovascular: Aortic atherosclerosis. Normal heart size. Three-vessel coronary artery calcifications. No pericardial effusion. Mediastinum/Nodes: Slight interval enlargement of multiple mediastinal lymph nodes, subcarinal lymph node measuring 2.7 x 2.2 cm, previously 2.4 x 1.7 cm (series 2, image 65). Thyroid gland, trachea, and esophagus demonstrate no significant findings. Lungs/Pleura: Diffuse bilateral bronchial wall thickening fall with scattered bronchial plugging. There has been a very substantial interval worsening of diffuse centrilobular and tree-in-bud pulmonary nodularity throughout the lungs. Unchanged, predominantly bandlike post treatment appearance of a lingular nodule. No pleural effusion or pneumothorax. Upper Abdomen: No acute abnormality. Musculoskeletal: No chest wall mass or suspicious bone lesions identified. IMPRESSION: 1. There has been a very substantial interval worsening of diffuse centrilobular and tree-in-bud pulmonary nodularity throughout the lungs, consistent with worsened atypical infection, particularly atypical Mycobacterium. 2. Diffuse bilateral bronchial wall thickening and scattered bronchial plugging, consistent with nonspecific infectious or inflammatory bronchitis. 3. Slight interval enlargement of multiple mediastinal lymph nodes,  likely reactive to infection. 4. Unchanged, predominantly bandlike post treatment appearance of a lingular nodule compared to recent staging CT. 5. Coronary artery disease. Aortic Atherosclerosis (ICD10-I70.0). Electronically Signed   By: Eddie Candle M.D.   On: 03/19/2021 15:57   CT Chest W Contrast  Result Date: 02/28/2021 CLINICAL DATA:  Left non-small cell lung cancer restaging, status post chemotherapy and radiation EXAM: CT CHEST WITH CONTRAST TECHNIQUE: Multidetector CT imaging of the chest  was performed during intravenous contrast administration. CONTRAST:  34mL OMNIPAQUE IOHEXOL 300 MG/ML  SOLN COMPARISON:  08/29/2020 FINDINGS: Cardiovascular: Aortic atherosclerosis. Normal heart size. Three-vessel coronary artery calcifications. No pericardial effusion. Mediastinum/Nodes: No enlarged mediastinal, hilar, or axillary lymph nodes. Thyroid gland, trachea, and esophagus demonstrate no significant findings. Lungs/Pleura: Mild centrilobular emphysema. Diffuse bilateral bronchial wall thickening. No significant interval change in post treatment appearance of a nodule of the lingula, measuring approximately 1.7 x 1.4 cm with adjacent bandlike scarring and fibrosis (series 3, image 73). There is some fluctuant centrilobular nodularity in the posterior lingula which is generally improved compared to prior examination. Unchanged 4 mm nodule of the left pulmonary apex (series 3, image 25). No pleural effusion or pneumothorax. Upper Abdomen: No acute abnormality. Musculoskeletal: No chest wall mass or suspicious bone lesions identified. IMPRESSION: 1. No significant interval change in post treatment appearance of a nodule of the lingula, measuring approximately 1.7 x 1.4 cm with adjacent bandlike scarring and fibrosis. 2. There is some fluctuant centrilobular nodularity in the posterior lingula which is generally improved compared to prior examination, consistent with improved although ongoing atypical infection. 3.  Unchanged 4 mm nodule of the left pulmonary apex, nonspecific, most likely infectious or inflammatory. Continued attention on follow-up. 4. Emphysema. 5. Diffuse bilateral bronchial wall thickening, consistent with nonspecific infectious or inflammatory bronchitis. 6. Coronary artery disease. Aortic Atherosclerosis (ICD10-I70.0) and Emphysema (ICD10-J43.9). Electronically Signed   By: Eddie Candle M.D.   On: 02/28/2021 08:22      ASSESSMENT/PLAN   Chest ct is notable that the left lung (lingula) cancer is in remission.  Oncology following  There has been a very substantial interval worsening of diffuse centrilobular and tree-in-bud pulmonary nodularity throughout the lungs, consistent with worsened atypical infection, particularly atypical Mycobacterium, scant sputum. quantifern tb gold, urine legionella, mycoplasma, bnp. No invasive studies at this time. Flutter valve for the mucucs impaction  The enlarged nodes are reactive in nature most likely  A little more bronchospastic. Fio2 can be lowered Continue present regimen   OUT PATIENT f/u with pulmonary       Thank you for allowing me to participate in the care of this patient.   Patient/Family are satisfied with care plan and all questions have been answered.  This document was prepared using Dragon voice recognition software and may include unintentional dictation errors.     Wallene Huh, M.D.  Division of [place]

## 2021-03-21 NOTE — TOC Initial Note (Signed)
Transition of Care [facility]) - Initial/Assessment Note    Patient Details  Name: Suzanne Glenn MRN: [id number] Date of Birth: 1951/08/06  [facility] CM/SW Contact:    Candie Chroman, LCSW Phone Number: 03/21/2021, 10:44 AM  Clinical Narrative:  Readmission prevention screen complete. CSW met with patient. Husband at bedside. CSW introduced role and explained that discharge planning would be discussed. PCP is Dr. Ginette Pitman. Her husband drives her to appointments. Goes to [employer]. No issues obtaining her medications. No home health prior to admission. She has a walker at home but does not use it. Patient confirmed she is on 2 L chronic oxygen at home which is provided through [place]. She is hoping to switch her oxygen provider once she follows up with Dr. Raul Del. No further concerns. CSW encouraged patient and her husband to contact CSW as needed. CSW will continue to follow patient and her husband for support and facilitate return home when stable.          Expected Discharge Plan: Home/Self Care Barriers to Discharge: Continued Medical Work up   Patient Goals and CMS Choice        Expected Discharge Plan and Services Expected Discharge Plan: Home/Self Care     Post Acute Care Choice: NA Living arrangements for the past 2 months: Single Family Home                                      Prior Living Arrangements/Services Living arrangements for the past 2 months: Single Family Home Lives with:: Spouse Patient language and need for interpreter reviewed:: Yes Do you feel safe going back to the place where you live?: Yes      Need for Family Participation in Patient Care: Yes (Comment) Care giver support system in place?: Yes (comment) Current home services: DME Criminal Activity/Legal Involvement Pertinent to Current Situation/Hospitalization: No - Comment as needed  Activities of Daily Living Home Assistive Devices/Equipment: None ADL Screening  (condition at time of admission) Patient's cognitive ability adequate to safely complete daily activities?: Yes Is the patient deaf or have difficulty hearing?: No Does the patient have difficulty seeing, even when wearing glasses/contacts?: No Does the patient have difficulty concentrating, remembering, or making decisions?: No Patient able to express need for assistance with ADLs?: Yes Does the patient have difficulty dressing or bathing?: No Independently performs ADLs?: Yes (appropriate for developmental age) Does the patient have difficulty walking or climbing stairs?: No Weakness of Legs: None Weakness of Arms/Hands: None  Permission Sought/Granted Permission sought to share information with : Family Supports Permission granted to share information with : Yes, Verbal Permission Granted  Share Information with NAME: Suzanne Glenn     Permission granted to share info w Relationship: Husband  Permission granted to share info w Contact Information: [phone]  Emotional Assessment Appearance:: Appears stated age Attitude/Demeanor/Rapport: Engaged,Gracious Affect (typically observed): Accepting,Appropriate,Calm,Pleasant Orientation: : Oriented to Self,Oriented to Place,Oriented to  Time,Oriented to Situation Alcohol / Substance Use: Not Applicable Psych Involvement: No (comment)  Admission diagnosis:  Atypical pneumonia [J18.9] Acute on chronic respiratory failure with hypoxia ([place]) [J96.21] Patient Active Problem List   Diagnosis Date Noted  . Acute on chronic respiratory failure with hypoxia ([place]) 03/20/2021  . Atypical pneumonia 03/19/2021  . MDD (major depressive disorder), recurrent, in full remission ([place]) 12/11/2019  . 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[place]) 07/26/2019  . Goals of  care, counseling/discussion 07/26/2019  . Major depressive disorder, recurrent, in partial remission ([place]) 03/27/2019  . Chronic respiratory failure with hypoxia ([place]) 03/27/2019  . Urge  incontinence of urine 03/27/2019  . Tobacco use 01/13/2019  . Mass of upper lobe of left lung 01/13/2019  . Symptomatic anemia 01/01/2019  . Restless leg syndrome 06/30/2018  . Pure hypercholesterolemia 09/26/2017  . Sepsis due to pneumonia ([place]) 02/24/2017  . Bilateral carotid artery stenosis 03/30/2016  . Syncope and collapse 03/30/2016  . Essential (primary) hypertension 08/29/2015  . Controlled type 2 diabetes mellitus without complication ([place]) 79/11/4095  . Combined fat and carbohydrate induced hyperlipemia 06/30/2015  . Clinical depression 05/05/2015  . GAD (generalized anxiety disorder) 05/05/2015  . Depression, major, recurrent, moderate ([place]) 05/05/2015  . Benign essential HTN 04/05/2015  . Diabetes mellitus, type 2 ([place]) 02/09/2015  . Type 2 diabetes mellitus ([place]) 02/09/2015  . Cannot sleep 01/24/2015  . Fibromyalgia 01/24/2015  . CAFL (chronic airflow limitation) ([place]) 01/24/2015  . H/O diabetes mellitus 01/24/2015  . H/O hypercholesterolemia 01/24/2015  . H/O: HTN (hypertension) 01/24/2015  . Coronary artery disease 01/24/2015  . Hypokalemia 01/24/2015  . Arteriosclerosis of coronary artery 10/04/2014  . Chest pain 10/04/2014  . 3-vessel CAD 08/02/2014  . Acute chest pain 06/25/2014  . Breath shortness 06/25/2014  . COPD, moderate ([place]) 06/08/2014  . Moderate COPD (chronic obstructive pulmonary disease) ([place]) 06/08/2014  . Gastro-esophageal reflux disease without esophagitis 05/31/2014   PCP:  Tracie Harrier, MD Pharmacy:   [place], [place] - [address] [place] [address] Phone: [phone] Fax: [phone]     Social Determinants of Health (SDOH) Interventions    Readmission Risk Interventions Readmission Risk Prevention Plan 03/21/2021  Transportation Screening Complete  PCP or Specialist Appt within 3-5 Days Complete  Social Work Consult for [place] Planning/Counseling Complete  Palliative Care  Screening Not Applicable  Medication Review Press photographer) Complete  Some recent data might be hidden

## 2021-03-21 NOTE — Evaluation (Signed)
Occupational Therapy Evaluation Patient Details Name: Suzanne Glenn MRN: [id number] DOB: Mar 31, 1951 Today's Date: 03/21/2021    History of Present Illness 70 y.o. 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  Patient reports independence without use of AD  PTA. She lives with husband and is on 2 L O2 via [place] at baseline. Pt does reports she takes O2 off at home to smoke and for shower. OT discussed importance of wearing O2 while in shower for safety and energy conservation. OT provided pt with handouts for [facility] education with self care and functional mobility.  Patient currently functioning at supervision overall and on 3 Ls and increased to 4Ls for mobility and able to remain above 92%. Patient will benefit from acute OT to increase overall independence in the areas of ADLs, functional mobility, and safety awareness in order to safely discharge home with family.    Follow Up Recommendations  No OT follow up;Supervision - Intermittent    Equipment Recommendations  3 in 1 bedside commode       Precautions / Restrictions Precautions Precautions: Fall      Mobility Bed Mobility Overal bed mobility: Modified Independent             General bed mobility comments: no physical assistance . HB elevated.    Transfers Overall transfer level: Needs assistance Equipment used:  None Transfers: Sit to/from [facility] Sit to Stand: Supervision Stand pivot transfers: Supervision       General transfer comment: for safety    Balance Overall balance assessment: Needs assistance Sitting-balance support: Feet supported Sitting balance-Leahy Scale: Good Sitting balance - Comments: no LOB   Standing balance support: During functional activity                               ADL either performed or assessed with clinical judgement   ADL                                         General ADL Comments: supervision overall with pt managing O2 cord.     Vision Patient Visual Report: No change from baseline Vision Assessment?: No apparent visual deficits            Pertinent Vitals/Pain Pain Assessment: No/denies pain     Hand Dominance Right   Extremity/Trunk Assessment Upper Extremity Assessment Upper Extremity Assessment: Generalized weakness;Overall [facility] for tasks assessed   Lower Extremity Assessment Lower Extremity Assessment: Overall WFL for tasks assessed;Generalized weakness   Cervical / Trunk Assessment Cervical / Trunk Assessment: Normal   Communication Communication Communication: No difficulties   Cognition Arousal/Alertness: Awake/alert Behavior During Therapy: WFL for tasks assessed/performed Overall Cognitive Status: Within Functional Limits for tasks assessed  Home Living Family/patient expects to be discharged to:: Private residence Living Arrangements: Spouse/significant other Available Help at Discharge: Family;Available 24 hours/day Type of Home: Mobile home Home Access: Stairs to enter Entrance Stairs-Number of Steps: "17" steps but very small she reports 3-4 inches Entrance Stairs-Rails: Right;Left;Can reach both Home Layout: One level     Bathroom Shower/Tub: Tub/shower unit     Bathroom Accessibility: No    Home Equipment: Cane - single point;Shower seat          Prior Functioning/Environment Level of Independence: Independent        Comments: I with self care and ambulation. She shares IADL tasks with husband and he drives her to appointments. She watches 75 y/o great grandchild several times a week.        OT Problem List: Decreased strength;Impaired balance (sitting and/or standing);Decreased safety awareness;Cardiopulmonary status limiting activity;Decreased activity tolerance;Decreased knowledge of use of DME or AE      OT Treatment/Interventions: Self-care/ADL training;Manual therapy;Therapeutic exercise;Patient/family education;Balance training;Energy conservation;Therapeutic activities;DME and/or AE instruction    OT Goals(Current goals can be found in the care plan section) Acute Rehab OT Goals Patient Stated Goal: to go home OT Goal Formulation: With patient Time For Goal Achievement: 04/04/21 Potential to Achieve Goals: Good ADL Goals Pt Will Perform Grooming: with modified independence;standing Pt Will Perform Lower Body Dressing: with modified independence;sit to/from stand Pt Will Transfer to Toilet: with modified independence;ambulating Pt Will Perform Toileting - Clothing Manipulation and hygiene: with modified independence;sit to/from stand Additional ADL Goal #1: P will verbalize and demonstrate 3 energy conservation techniques related to self care tasks for safe return home.  OT Frequency: Min 2X/week   Barriers to D/C:    none known at this time          AM-PAC OT "6 Clicks" Daily Activity     Outcome Measure Help from another person eating meals?: None Help from another person taking care of personal grooming?: None Help from another person toileting, which includes using toliet, bedpan, or urinal?: A Little Help from another person bathing (including washing, rinsing, drying)?: A Little Help from another person to put on and taking off regular upper  body clothing?: None Help from another person to put on and taking off regular lower body clothing?: A Little 6 Click Score: 21   End of Session Equipment Utilized During Treatment: Oxygen (3-4 Ls) Nurse Communication: Mobility status  Activity Tolerance: Patient tolerated treatment well Patient left: in bed;with call bell/phone within reach;with bed alarm set  OT Visit Diagnosis: Unsteadiness on feet (R26.81);Muscle weakness (generalized) (M62.81)                Time: 5035-4656 OT Time Calculation (min): 35 min Charges:  OT General Charges $OT Visit: 1 Visit OT Evaluation $OT Eval Moderate Complexity: 1 Mod OT Treatments $Self Care/Home Management : 8-22 mins $Therapeutic Activity: 8-22 mins  Darleen Crocker, MS, OTR/L , CBIS ascom [phone]  03/21/21, 4:16 PM

## 2021-03-21 NOTE — Progress Notes (Signed)
PROGRESS NOTE    Suzanne Glenn  LOV:[id number] DOB: 1951-01-02 DOA: 03/19/2021 PCP: Tracie Harrier, MD   Brief Narrative:   70 y.o. 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  She presents emergency department for chief concerns of worsening shortness of breath and productive cough.  She states that the shortness of breath started AM of 03/18/21.  She reports that the green sputum started on 03/17/2021.  She endorses that she takes care of her 69 year old grandson, who has been having a sore throat and cough.   Due to the marked change on CT pulmonary consultation was requested.  Patient is known to Dr. Raul Del.  Infectious work-up was been ordered but no intervention is warranted.  Patient respiratory status is slowly stabilizing however remains dependent on 4 L oxygen   Assessment & Plan:   Principal Problem:   Atypical pneumonia Active Problems:   Fibromyalgia   Coronary artery disease   Benign essential HTN   COPD, moderate (HCC)   Clinical depression   Gastro-esophageal reflux disease without esophagitis   GAD (generalized anxiety disorder)   Depression, major, recurrent, moderate (HCC)   Essential (primary) hypertension   Pure hypercholesterolemia   Restless leg syndrome   Tobacco use   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HCC)   Chronic respiratory failure with hypoxia (HCC)   MDD (major depressive disorder), recurrent, in full remission (HCC)   Acute on chronic respiratory failure with hypoxia  (HCC)  Atypical pneumonia Acute on chronic hypoxic respiratory failure COPD with possible acute exacerbation Patient with complex pulmonary history Sees Dr. Raul Del as outpatient Chest CT with worsened tree-in-bud nodularity concerning for atypical infection Based on chronic 2 L, currently on 4 Patient on home trilogy Pulmonary consultation requested Plan: Continue CAP coverage Rocephin azithromycin Bronchodilator regimen Continue intravenous steroids for today Can likely de-escalate to prednisone tomorrow Continue oxygen, wean as tolerated, home rate 2 L Counseled tobacco cessation Incentive spirometry and flutter valve use Patient may be able to discharge home on 6/8 if oxygen requirement returned to baseline 4 L Request therapy evaluations  Hypertension controlled, patient states that she took her antihypertensive medication prior to ED presentation Plan: Amlodipine 10 mg daily Metoprolol succinate tartrate 25 mg twice daily Aldactone 25 mg daily Vitals per unit protocol  Hyperlipidemia pravastatin 80 mg nightly  Major depressive disorder venlafaxine 150 mg p.o. daily  Non-insulin-dependent diabetes mellitus  metformin 500 mg p.o. daily with breakfast resumed  GERD  PPI  Neuropathy Patient endorses some numbness in the face Possibly attributed to high-dose gabapentin Plan: Decrease gabapentin from home dose of 600 3 times daily to 400 3 times daily Consider discharging on this reduced dose  Parkinson disease resumed carbidopa-levodopa 25-100 mg p.o. daily 3 times daily  Tobacco abuse Counseled extensively on tobacco cessation Offered nicotine patch, patient declined States she is ready to stop smoking  History of squamous cell lung cancer No acute issues Completed chemotherapy/radiation in 2020   DVT prophylaxis: SQ Lovenox Code Status: Full Family Communication: None today, offered to call but the patient declined Disposition Plan: Status  is: Inpatient  Remains inpatient appropriate  because:Inpatient level of care appropriate due to severity of illness   Dispo: The patient is from: Home              Anticipated d/c is to: Home              Patient currently is not medically stable to d/c.   Difficult to place patient No   Atypical pneumonia, possible COPD component.  Pulmonary on consult.  Infectious panel in progress however if patient is symptomatically improved and back on 2 L she should be able to discharge by tomorrow 6/8           Level of care: Med-Surg  Consultants:   Pulmonary- Dr. Lanney Gins  Procedures:   None  Antimicrobials:   Ceftriaxone  Azithromycin   Subjective: Patient seen and examined.  Denies pain complaints.  Shortness of breath improved.  Objective: Vitals:   03/20/21 2321 03/21/21 0440 03/21/21 0806 03/21/21 1116  BP: 140/80 140/83 131/74 121/86  Pulse: (!) 106 (!) 101 94 89  Resp: 20 18 20 18   Temp: 99.1 F (37.3 C) 98.3 F (36.8 C) 98.2 F (36.8 C) 98.4 F (36.9 C)  TempSrc: Oral Oral Oral Oral  SpO2: 94% 96% 93% 93%  Weight:      Height:        Intake/Output Summary (Last 24 hours) at 03/21/2021 1139 Last data filed at 03/21/2021 0900 Gross per 24 hour  Intake 1037.2 ml  Output 900 ml  Net 137.2 ml   Filed Weights   03/19/21 1250  Weight: 93.9 kg    Examination:  General exam: No acute distress.  Appears chronically ill Respiratory system: Scattered crackles.  Bilateral end expiratory wheeze.  Normal work of breathing.  4 L Cardiovascular system: S1-S2, regular rate and rhythm, no murmurs Gastrointestinal system: Obese, nontender, nondistended, normal bowel sounds Central nervous system: Alert and oriented. No focal neurological deficits. Extremities: Symmetric 5 x 5 power. Skin: Pale, scattered excoriation, no obvious rashes or lesions Psychiatry: Judgement and insight appear normal. Mood & affect appropriate.     Data Reviewed: I have personally  reviewed following labs and imaging studies  CBC: Recent Labs  Lab 03/19/21 1257 03/20/21 0458  WBC 23.3* 20.3*  HGB 12.6 12.5  HCT 39.5 39.7  MCV 94.3 95.2  PLT 255 449   Basic Metabolic Panel: Recent Labs  Lab 03/19/21 1257 03/20/21 0458  NA 137 137  K 4.2 5.1  CL 99 98  CO2 28 30  GLUCOSE 166* 153*  BUN 17 30*  CREATININE 0.74 1.08*  CALCIUM 9.1 9.0   GFR: Estimated Creatinine Clearance: 54.3 mL/min (A) (by C-G formula based on SCr of 1.08 mg/dL (H)). Liver Function Tests: No results for input(s): AST, ALT, ALKPHOS, BILITOT, PROT, ALBUMIN in the last 168 hours. No results for input(s): LIPASE, AMYLASE in the last 168 hours. No results for input(s): AMMONIA in the last 168 hours. Coagulation Profile: No results for input(s): INR, PROTIME in the last 168 hours. Cardiac Enzymes: No results for input(s): CKTOTAL, CKMB, CKMBINDEX, TROPONINI in the last 168 hours. BNP (last 3 results) No results for input(s): PROBNP in the last 8760 hours. HbA1C: No results for input(s): HGBA1C in the last 72 hours. CBG: No results for input(s): GLUCAP in the last 168 hours. Lipid Profile: No results for input(s): CHOL, HDL, LDLCALC, TRIG, CHOLHDL, LDLDIRECT in the last 72 hours. Thyroid Function Tests: No results for input(s): TSH, T4TOTAL, FREET4, T3FREE, THYROIDAB in the last 72 hours. Anemia  Panel: No results for input(s): VITAMINB12, FOLATE, FERRITIN, TIBC, IRON, RETICCTPCT in the last 72 hours. Sepsis Labs: Recent Labs  Lab 03/19/21 1642  LATICACIDVEN 1.6    Recent Results (from the past 240 hour(s))  Resp Panel by RT-PCR (Flu A&B, Covid) Nasopharyngeal Swab     Status: None   Collection Time: 03/19/21  1:59 PM   Specimen: Nasopharyngeal Swab; Nasopharyngeal(NP) swabs in vial transport medium  Result Value Ref Range Status   SARS Coronavirus 2 by RT PCR NEGATIVE NEGATIVE Final    Comment: (NOTE) SARS-CoV-2 target nucleic acids are NOT DETECTED.  The SARS-CoV-2 RNA  is generally detectable in upper respiratory specimens during the acute phase of infection. The lowest concentration of SARS-CoV-2 viral copies this assay can detect is 138 copies/mL. A negative result does not preclude SARS-Cov-2 infection and should not be used as the sole basis for treatment or other patient management decisions. A negative result may occur with  improper specimen collection/handling, submission of specimen other than nasopharyngeal swab, presence of viral mutation(s) within the areas targeted by this assay, and inadequate number of viral copies(<138 copies/mL). A negative result must be combined with clinical observations, patient history, and epidemiological information. The expected result is Negative.  Fact Sheet for Patients:  EntrepreneurPulse.com.au  Fact Sheet for Healthcare Providers:  IncredibleEmployment.be  This test is no t yet approved or cleared by the [place] FDA and  has been authorized for detection and/or diagnosis of SARS-CoV-2 by FDA under an Emergency Use Authorization (EUA). This EUA will remain  in effect (meaning this test can be used) for the duration of the COVID-19 declaration under Section 564(b)(1) of the Act, 21 U.S.C.section 360bbb-3(b)(1), unless the authorization is terminated  or revoked sooner.       Influenza A by PCR NEGATIVE NEGATIVE Final   Influenza B by PCR NEGATIVE NEGATIVE Final    Comment: (NOTE) The Xpert Xpress SARS-CoV-2/FLU/RSV plus assay is intended as an aid in the diagnosis of influenza from Nasopharyngeal swab specimens and should not be used as a sole basis for treatment. Nasal washings and aspirates are unacceptable for Xpert Xpress SARS-CoV-2/FLU/RSV testing.  Fact Sheet for Patients: EntrepreneurPulse.com.au  Fact Sheet for Healthcare Providers: IncredibleEmployment.be  This test is not yet approved or cleared by the  [place] FDA and has been authorized for detection and/or diagnosis of SARS-CoV-2 by FDA under an Emergency Use Authorization (EUA). This EUA will remain in effect (meaning this test can be used) for the duration of the COVID-19 declaration under Section 564(b)(1) of the Act, 21 U.S.C. section 360bbb-3(b)(1), unless the authorization is terminated or revoked.  Performed at [facility], [address] [place], [place] [address]   Blood culture (routine x 2)     Status: None (Preliminary result)   Collection Time: 03/19/21  4:42 PM   Specimen: BLOOD  Result Value Ref Range Status   Specimen Description BLOOD LEFT AC  Final   Special Requests   Final    BOTTLES DRAWN AEROBIC AND ANAEROBIC Blood Culture results may not be optimal due to an inadequate volume of blood received in culture bottles   Culture   Final    NO GROWTH 2 DAYS Performed at [facility], [address] [place], [place] [address]    Report Status PENDING  Incomplete  Blood culture (routine x 2)     Status: None (Preliminary result)   Collection Time: 03/19/21  4:42 PM   Specimen: BLOOD  Result Value Ref  Range Status   Specimen Description BLOOD LEFT AC  Final   Special Requests   Final    BOTTLES DRAWN AEROBIC AND ANAEROBIC Blood Culture adequate volume   Culture   Final    NO GROWTH 2 DAYS Performed at [facility], [address] [place], [place] [address]    Report Status PENDING  Incomplete  Expectorated Sputum Assessment w Gram Stain, Rflx to Resp Cult     Status: None   Collection Time: 03/21/21  5:50 AM   Specimen: Expectorated Sputum  Result Value Ref Range Status   Specimen Description EXPECTORATED SPUTUM  Final   Special Requests NONE  Final   Sputum evaluation   Final    THIS SPECIMEN IS ACCEPTABLE FOR SPUTUM CULTURE Performed at [facility], [address], [place], [place] [address]    Report Status 03/21/2021 FINAL  Final          Radiology Studies: DG Chest 2 View  Result Date: 03/19/2021 CLINICAL DATA:  Shortness of breath EXAM: CHEST - 2 VIEW COMPARISON:  July 16, 2019 chest radiograph; chest CT Feb 27, 2021 FINDINGS: There is atelectatic change in the left mid lung. The lungs elsewhere are clear. The heart size and pulmonary vascularity are normal. No adenopathy. There is aortic atherosclerosis. There is postop change in the lower cervical region. IMPRESSION: Left midlung atelectasis. Lungs elsewhere clear. Cardiac silhouette within normal limits. Aortic Atherosclerosis (ICD10-I70.0). Electronically Signed   By: Lowella Grip III M.D.   On: 03/19/2021 13:32   CT Chest Wo Contrast  Result Date: 03/19/2021 CLINICAL DATA:  Persistent cough, shortness of breath, history of lung cancer EXAM: CT CHEST WITHOUT CONTRAST TECHNIQUE: Multidetector CT imaging of the chest was performed following the standard protocol without IV contrast. COMPARISON:  02/27/2021 FINDINGS: Cardiovascular: Aortic atherosclerosis. Normal heart size. Three-vessel coronary artery calcifications. No pericardial effusion. Mediastinum/Nodes: Slight interval enlargement of multiple mediastinal lymph nodes, subcarinal lymph node measuring 2.7 x 2.2 cm, previously 2.4 x 1.7 cm (series 2, image 65). Thyroid gland, trachea, and esophagus demonstrate no significant findings. Lungs/Pleura: Diffuse bilateral bronchial wall thickening fall with scattered bronchial plugging. There has been a very substantial interval worsening of diffuse centrilobular and tree-in-bud pulmonary nodularity throughout the lungs. Unchanged, predominantly bandlike post treatment appearance of a lingular nodule. No pleural effusion or pneumothorax. Upper Abdomen: No acute abnormality. Musculoskeletal: No chest wall mass or suspicious bone lesions identified. IMPRESSION: 1. There has been a very substantial interval worsening of diffuse centrilobular and tree-in-bud pulmonary nodularity  throughout the lungs, consistent with worsened atypical infection, particularly atypical Mycobacterium. 2. Diffuse bilateral bronchial wall thickening and scattered bronchial plugging, consistent with nonspecific infectious or inflammatory bronchitis. 3. Slight interval enlargement of multiple mediastinal lymph nodes, likely reactive to infection. 4. Unchanged, predominantly bandlike post treatment appearance of a lingular nodule compared to recent staging CT. 5. Coronary artery disease. Aortic Atherosclerosis (ICD10-I70.0). Electronically Signed   By: Eddie Candle M.D.   On: 03/19/2021 15:57        Scheduled Meds: . amLODipine  10 mg Oral Daily  . aspirin  81 mg Oral Daily  . carbidopa-levodopa  1 tablet Oral TID  . enoxaparin (LOVENOX) injection  0.5 mg/kg Subcutaneous Q24H  . fluticasone furoate-vilanterol  1 puff Inhalation Daily   And  . umeclidinium bromide  1 puff Inhalation Daily  . gabapentin  400 mg Oral TID  . guaiFENesin  600 mg Oral BID  . metFORMIN  500 mg Oral Q breakfast  .  methylPREDNISolone (SOLU-MEDROL) injection  40 mg Intravenous Q12H  . metoprolol tartrate  25 mg Oral BID  . multivitamin with minerals  1 tablet Oral Daily  . pantoprazole  80 mg Oral Q1200  . pravastatin  80 mg Oral QHS  . venlafaxine XR  150 mg Oral Q breakfast  . cyanocobalamin  1,000 mcg Oral Daily   Continuous Infusions: . azithromycin Stopped (03/20/21 1821)  . cefTRIAXone (ROCEPHIN)  IV Stopped (03/20/21 1925)     LOS: 1 day    Time spent: 15 minutes    Sidney Ace, MD [facility] Pager [phone]  If 7PM-7AM, please contact night-coverage 03/21/2021, 11:39 AM

## 2021-03-22 DIAGNOSIS — F331 Major depressive disorder, recurrent, moderate: Secondary | ICD-10-CM

## 2021-03-22 DIAGNOSIS — J9611 Chronic respiratory failure with hypoxia: Secondary | ICD-10-CM

## 2021-03-22 DIAGNOSIS — J9621 Acute and chronic respiratory failure with hypoxia: Secondary | ICD-10-CM

## 2021-03-22 DIAGNOSIS — I10 Essential (primary) hypertension: Secondary | ICD-10-CM

## 2021-03-22 LAB — MYCOPLASMA PNEUMONIAE ANTIBODY, IGM: Mycoplasma pneumo IgM: <770 U/mL (ref 0–769)

## 2021-03-22 MED ORDER — SODIUM CHLORIDE 0.9 % IV SOLN
2.0000 g | INTRAVENOUS | Status: DC
  Administered 2021-03-22: 2 g via INTRAVENOUS
  Filled 2021-03-22: qty 2
  Filled 2021-03-22: qty 20

## 2021-03-22 MED ORDER — SENNOSIDES-DOCUSATE SODIUM 8.6-50 MG PO TABS
2.0000 | ORAL_TABLET | Freq: Two times a day (BID) | ORAL | Status: DC
  Administered 2021-03-22 – 2021-03-23 (×3): 2 via ORAL
  Filled 2021-03-22 (×3): qty 2

## 2021-03-22 MED ORDER — FUROSEMIDE 10 MG/ML IJ SOLN
40.0000 mg | Freq: Once | INTRAMUSCULAR | Status: AC
  Administered 2021-03-22: 40 mg via INTRAVENOUS
  Filled 2021-03-22: qty 4

## 2021-03-22 MED ORDER — METHYLPREDNISOLONE SODIUM SUCC 125 MG IJ SOLR
60.0000 mg | Freq: Two times a day (BID) | INTRAMUSCULAR | Status: DC
  Administered 2021-03-22 – 2021-03-23 (×2): 60 mg via INTRAVENOUS
  Filled 2021-03-22 (×2): qty 2

## 2021-03-22 NOTE — Progress Notes (Signed)
PROGRESS NOTE    Suzanne Glenn  QBH:[id number] DOB: 1951/08/06 DOA: 03/19/2021 PCP: Tracie Harrier, MD   Brief Narrative:   70 y.o. 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  She presents emergency department for chief concerns of worsening shortness of breath and productive cough.  She states that the shortness of breath started AM of 03/18/21.  She reports that the green sputum started on 03/17/2021.  She endorses that she takes care of her 63 year old grandson, who has been having a sore throat and cough.   Due to the marked change on CT pulmonary consultation was requested.  Patient is known to Dr. Raul Del.  Infectious work-up was been ordered but no intervention is warranted.  Patient respiratory status is slowly stabilizing however remains dependent on 4 L oxygen   Assessment & Plan:   Principal Problem:   Atypical pneumonia Active Problems:   Fibromyalgia   Coronary artery disease   Benign essential HTN   COPD, moderate (HCC)   Clinical depression   Gastro-esophageal reflux disease without esophagitis   GAD (generalized anxiety disorder)   Depression, major, recurrent, moderate (HCC)   Essential (primary) hypertension   Pure hypercholesterolemia   Restless leg syndrome   Tobacco use   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HCC)   Chronic respiratory failure with hypoxia (HCC)   MDD (major depressive disorder), recurrent, in full remission (HCC)   Acute on chronic respiratory failure with hypoxia  (HCC)  Atypical pneumonia Acute on chronic hypoxic respiratory failure COPD with possible acute exacerbation Patient with complex pulmonary history Sees Dr. Raul Del as outpatient Chest CT with worsened tree-in-bud nodularity concerning for atypical infection Based on chronic 2 L, currently on 4 Patient on home trilogy Pulmonary Dr. Raul Del following Continue CAP coverage Rocephin.  Completed azithromycin Continue bronchodilator regimen Increase IV Solu-Medrol from 40 to 60 mg IV twice daily as she is wheezing extensively today Continue oxygen, wean as tolerated, home rate 2 L Counseled tobacco cessation Incentive spirometry and flutter valve use We will give 40 mg of IV Lasix once for diuresis trial and see if that helps wean her oxygen and helps her symptoms  Hypertension controlled, patient states that she took her antihypertensive medication prior to ED presentation Continue Amlodipine 10 mg daily Metoprolol succinate tartrate 25 mg twice daily Aldactone 25 mg daily  Hyperlipidemia pravastatin 80 mg nightly  Major depressive disorder venlafaxine 150 mg p.o. daily  Non-insulin-dependent diabetes mellitus  metformin 500 mg p.o. daily with breakfast resumed  GERD  PPI  Neuropathy Patient endorses some numbness in the face Continue gabapentin 400 mg p.o. 3 times daily  Parkinson disease Continue carbidopa-levodopa 25-100 mg p.o. daily 3 times daily  Tobacco abuse Counseled extensively on tobacco cessation Offered nicotine patch, patient declined States she is ready to stop smoking  History of squamous cell lung cancer No acute issues Completed chemotherapy/radiation in 2020  Constipation We will add Senokot-S  DVT prophylaxis: SQ Lovenox Code Status: Full Family Communication: None today Disposition Plan: Status is: Inpatient  Remains inpatient appropriate because:Inpatient level of care appropriate due to severity of illness   Dispo:  The patient  is from: Home              Anticipated d/c is to: Home              Patient currently is not medically stable to d/c.   Difficult to place patient No   Atypical pneumonia, possible COPD component.  Still wheezing in had to increase her Solu-Medrol dose and added IV Lasix today.  Would like to wean her oxygen and make sure her symptoms are improved before considering discharge   Level of care: Med-Surg  Consultants:   Pulmonary- Dr. Raul Del  Procedures:   None  Antimicrobials:   Ceftriaxone  Azithromycin completed   Subjective: Not breathing too well.  Very wheezy and short of breath today.  Reports no bowel movement for last 3 days and requesting stool softener  Objective: Vitals:   03/22/21 0500 03/22/21 1021 03/22/21 1205 03/22/21 1209  BP: 136/88 (!) 157/78 (!) 143/99 140/88  Pulse: 67 (!) 104 (!) 40   Resp: 20 20 20    Temp: 98.6 F (37 C) 98.3 F (36.8 C) 98.2 F (36.8 C)   TempSrc: Oral Oral    SpO2: 91% 95% 94%   Weight:      Height:        Intake/Output Summary (Last 24 hours) at 03/22/2021 1356 Last data filed at 03/22/2021 1021 Gross per 24 hour  Intake 420 ml  Output 400 ml  Net 20 ml   Filed Weights   03/19/21 1250  Weight: 93.9 kg    Examination:  General exam: No acute distress.  Appears chronically ill Respiratory system: Scattered crackles.  Bilateral end expiratory wheeze.  Normal work of breathing.  4 L Cardiovascular system: S1-S2, regular rate and rhythm, no murmurs Gastrointestinal system: Obese, nontender, nondistended, normal bowel sounds Central nervous system: Alert and oriented. No focal neurological deficits. Extremities: Symmetric 5 x 5 power. Skin: Pale, scattered excoriation, no obvious rashes or lesions Psychiatry: Judgement and insight appear normal. Mood & affect appropriate.     Data Reviewed: I have personally reviewed following labs and imaging studies  CBC: Recent Labs  Lab 03/19/21 1257 03/20/21 0458  WBC  23.3* 20.3*  HGB 12.6 12.5  HCT 39.5 39.7  MCV 94.3 95.2  PLT 255 578   Basic Metabolic Panel: Recent Labs  Lab 03/19/21 1257 03/20/21 0458  NA 137 137  K 4.2 5.1  CL 99 98  CO2 28 30  GLUCOSE 166* 153*  BUN 17 30*  CREATININE 0.74 1.08*  CALCIUM 9.1 9.0   GFR: Estimated Creatinine Clearance: 54.3 mL/min (A) (by C-G formula based on SCr of 1.08 mg/dL (H)). Liver Function Tests: No results for input(s): AST, ALT, ALKPHOS, BILITOT, PROT, ALBUMIN in the last 168 hours. No results for input(s): LIPASE, AMYLASE in the last 168 hours. No results for input(s): AMMONIA in the last 168 hours. Coagulation Profile: No results for input(s): INR, PROTIME in the last 168 hours. Cardiac Enzymes: No results for input(s): CKTOTAL, CKMB, CKMBINDEX, TROPONINI in the last 168 hours. BNP (last 3 results) No results for input(s): PROBNP in the last 8760 hours. HbA1C: No results for input(s): HGBA1C in the last 72 hours. CBG: No results for input(s): GLUCAP in the last 168 hours. Lipid Profile: No results for input(s): CHOL, HDL, LDLCALC, TRIG, CHOLHDL, LDLDIRECT in the last 72 hours. Thyroid Function Tests: No results for input(s): TSH, T4TOTAL, FREET4, T3FREE, THYROIDAB in the last 72 hours. Anemia Panel: No results for input(s):  VITAMINB12, FOLATE, FERRITIN, TIBC, IRON, RETICCTPCT in the last 72 hours. Sepsis Labs: Recent Labs  Lab 03/19/21 1642  LATICACIDVEN 1.6    Recent Results (from the past 240 hour(s))  Resp Panel by RT-PCR (Flu A&B, Covid) Nasopharyngeal Swab     Status: None   Collection Time: 03/19/21  1:59 PM   Specimen: Nasopharyngeal Swab; Nasopharyngeal(NP) swabs in vial transport medium  Result Value Ref Range Status   SARS Coronavirus 2 by RT PCR NEGATIVE NEGATIVE Final    Comment: (NOTE) SARS-CoV-2 target nucleic acids are NOT DETECTED.  The SARS-CoV-2 RNA is generally detectable in upper respiratory specimens during the acute phase of infection. The  lowest concentration of SARS-CoV-2 viral copies this assay can detect is 138 copies/mL. A negative result does not preclude SARS-Cov-2 infection and should not be used as the sole basis for treatment or other patient management decisions. A negative result may occur with  improper specimen collection/handling, submission of specimen other than nasopharyngeal swab, presence of viral mutation(s) within the areas targeted by this assay, and inadequate number of viral copies(<138 copies/mL). A negative result must be combined with clinical observations, patient history, and epidemiological information. The expected result is Negative.  Fact Sheet for Patients:  EntrepreneurPulse.com.au  Fact Sheet for Healthcare Providers:  IncredibleEmployment.be  This test is no t yet approved or cleared by the [place] FDA and  has been authorized for detection and/or diagnosis of SARS-CoV-2 by FDA under an Emergency Use Authorization (EUA). This EUA will remain  in effect (meaning this test can be used) for the duration of the COVID-19 declaration under Section 564(b)(1) of the Act, 21 U.S.C.section 360bbb-3(b)(1), unless the authorization is terminated  or revoked sooner.       Influenza A by PCR NEGATIVE NEGATIVE Final   Influenza B by PCR NEGATIVE NEGATIVE Final    Comment: (NOTE) The Xpert Xpress SARS-CoV-2/FLU/RSV plus assay is intended as an aid in the diagnosis of influenza from Nasopharyngeal swab specimens and should not be used as a sole basis for treatment. Nasal washings and aspirates are unacceptable for Xpert Xpress SARS-CoV-2/FLU/RSV testing.  Fact Sheet for Patients: EntrepreneurPulse.com.au  Fact Sheet for Healthcare Providers: IncredibleEmployment.be  This test is not yet approved or cleared by the [place] FDA and has been authorized for detection and/or diagnosis of SARS-CoV-2 by FDA under  an Emergency Use Authorization (EUA). This EUA will remain in effect (meaning this test can be used) for the duration of the COVID-19 declaration under Section 564(b)(1) of the Act, 21 U.S.C. section 360bbb-3(b)(1), unless the authorization is terminated or revoked.  Performed at [facility], [address] [place], [place] [address]   Blood culture (routine x 2)     Status: None (Preliminary result)   Collection Time: 03/19/21  4:42 PM   Specimen: BLOOD  Result Value Ref Range Status   Specimen Description BLOOD LEFT AC  Final   Special Requests   Final    BOTTLES DRAWN AEROBIC AND ANAEROBIC Blood Culture results may not be optimal due to an inadequate volume of blood received in culture bottles   Culture   Final    NO GROWTH 3 DAYS Performed at [facility], [address] [place], [place] [address]    Report Status PENDING  Incomplete  Blood culture (routine x 2)     Status: None (Preliminary result)   Collection Time: 03/19/21  4:42 PM   Specimen: BLOOD  Result Value Ref Range Status   Specimen  Description BLOOD LEFT AC  Final   Special Requests   Final    BOTTLES DRAWN AEROBIC AND ANAEROBIC Blood Culture adequate volume   Culture   Final    NO GROWTH 3 DAYS Performed at [facility], [address] [place], [place] [address]    Report Status PENDING  Incomplete  Expectorated Sputum Assessment w Gram Stain, Rflx to Resp Cult     Status: None   Collection Time: 03/21/21  5:50 AM   Specimen: Expectorated Sputum  Result Value Ref Range Status   Specimen Description EXPECTORATED SPUTUM  Final   Special Requests NONE  Final   Sputum evaluation   Final    THIS SPECIMEN IS ACCEPTABLE FOR SPUTUM CULTURE Performed at [facility], [address], [place], [place] [address]    Report Status 03/21/2021 FINAL  Final  Culture, Respiratory w Gram Stain     Status: None (Preliminary result)   Collection Time: 03/21/21  5:50 AM  Result  Value Ref Range Status   Specimen Description   Final    EXPECTORATED SPUTUM Performed at [facility], [address] [place], [place] [address]    Special Requests   Final    NONE Reflexed from [id number] Performed at [facility], [address] [place], [place] [address]    Gram Stain   Final    ABUNDANT WBC PRESENT, PREDOMINANTLY PMN FEW GRAM POSITIVE COCCI    Culture   Final    CULTURE REINCUBATED FOR BETTER GROWTH Performed at [facility], [place] [address], [place], [place] [address]    Report Status PENDING  Incomplete         Radiology Studies: No results found.      Scheduled Meds: . amLODipine  10 mg Oral Daily  . aspirin  81 mg Oral Daily  . carbidopa-levodopa  1 tablet Oral TID  . enoxaparin (LOVENOX) injection  0.5 mg/kg Subcutaneous Q24H  . fluticasone furoate-vilanterol  1 puff Inhalation Daily   And  . umeclidinium bromide  1 puff Inhalation Daily  . gabapentin  400 mg Oral TID  . guaiFENesin  600 mg Oral BID  . metFORMIN  500 mg Oral Q breakfast  . methylPREDNISolone (SOLU-MEDROL) injection  60 mg Intravenous Q12H  . metoprolol tartrate  25 mg Oral BID  . multivitamin with minerals  1 tablet Oral Daily  . pantoprazole  80 mg Oral Q1200  . pravastatin  80 mg Oral QHS  . venlafaxine XR  150 mg Oral Q breakfast  . cyanocobalamin  1,000 mcg Oral Daily   Continuous Infusions: . cefTRIAXone (ROCEPHIN)  IV       LOS: 2 days    Time spent: 15 minutes    Max Sane, MD [facility] Pager [phone]  If 7PM-7AM, please contact night-coverage 03/22/2021, 1:56 PM

## 2021-03-22 NOTE — Progress Notes (Signed)
Mobility Specialist - Progress Note   03/22/21 1643  Mobility  Activity Ambulated in hall  Level of Assistance Standby assist, set-up cues, supervision of patient - no hands on  Assistive Device None  Distance Ambulated (ft) 180 ft  Mobility Ambulated with assistance in hallway  Mobility Response Tolerated well  Mobility performed by Mobility specialist  $Mobility charge 1 Mobility    O2 while resting on 2L = 93% O2 while AMB on 2L = 86% O2 while AMB on 4L = 91%   Pt lying in bed utilizing 4L on arrival. Voiced pain behind L breast, but denies any chest pain. Weaned to baseline 2L to trial ambulation. No AD in use for session, no LOB. Pt desat to 86% during ambulation and was bumped to 4L for remainder of session to rebound sats > 90%. No wheezing noted this date. Max HR of 122 bpm. O2 desat to 87% when returning supine. Pt left on 4L with sats at 92% prior to exit.    Kathee Delton Mobility Specialist 03/22/21, 5:04 PM

## 2021-03-22 NOTE — Progress Notes (Signed)
Order received to discontinue telemetry

## 2021-03-22 NOTE — Progress Notes (Signed)
Pulmonary Medicine       HISTORY OF PRESENT ILLNESS      PAST MEDICAL HISTORY   Past Medical History:  Diagnosis Date  . Anxiety   . Asthma   . Cancer ([place]) 12/2018   w/u for right upper lobe mass/cancer  . COPD (chronic obstructive pulmonary disease) (HCC)    also, emphysema. now using o2 via np 24 hours a day  . Coronary artery disease   . Depression   . Diabetes mellitus, type II ([place])   . GERD (gastroesophageal reflux disease)   . Headache    migraines in early 20's  . Heart murmur   . Hypertension   . Lung cancer ([place])   . Neuropathy   . Restless leg      SURGICAL HISTORY   Past Surgical History:  Procedure Laterality Date  . BACK SURGERY  2005   surgery x 2, disc fused in neck, pinched nerve in center of back and neck  . CARDIAC CATHETERIZATION  2015   1 stent placed for blockage  . cervical bone infusion    . CHOLECYSTECTOMY    . THORACIC DISC SURGERY    . TUBAL LIGATION    . VIDEO BRONCHOSCOPY WITH ENDOBRONCHIAL ULTRASOUND N/A 03/06/2019   Procedure: VIDEO BRONCHOSCOPY WITH ENDOBRONCHIAL ULTRASOUND - DIABETIC;  Surgeon: Ottie Glazier, MD;  Location: [facility] ORS;  Service: Thoracic;  Laterality: N/A;     FAMILY HISTORY   Family History  Problem Relation Age of Onset  . Anxiety disorder Mother   . Cancer - Lung Mother   . Depression Sister   . Cancer Sister   . Depression Brother   . Cancer Sister   . Cancer Sister   . Cancer Sister   . Heart Problems Sister   . Bipolar disorder Sister   . Diabetes Sister   . Cancer - Lung Sister   . Hypertension Sister   . Diabetes Sister   . Hyperlipidemia Sister   . COPD Sister   . Heart Problems Brother   . Cancer Brother   . Cancer Brother   . Cancer Brother   . Breast cancer Maternal Aunt      SOCIAL HISTORY   Social History   Tobacco Use  . Smoking status: Current Every Day Smoker    Packs/day: 1.00    Years: 40.00    Pack years: 40.00    Types: Cigarettes    Start date:  05/05/1975  . Smokeless tobacco: Never Used  Vaping Use  . Vaping Use: Never used  Substance Use Topics  . Alcohol use: No    Alcohol/week: 0.0 standard drinks    Comment: socially  . Drug use: No     MEDICATIONS    Home Medication:    Current Medication:  Current Facility-Administered Medications:  .  acetaminophen (TYLENOL) tablet 650 mg, 650 mg, Oral, Q6H PRN **OR** acetaminophen (TYLENOL) suppository 650 mg, 650 mg, Rectal, Q6H PRN, Cox, Amy N, DO .  albuterol (PROVENTIL) (2.5 MG/3ML) 0.083% nebulizer solution 2.5 mg, 2.5 mg, Nebulization, Q6H PRN, Cox, Amy N, DO, 2.5 mg at 03/22/21 0640 .  amLODipine (NORVASC) tablet 10 mg, 10 mg, Oral, Daily, Cox, Amy N, DO, 10 mg at 03/22/21 1034 .  aspirin chewable tablet 81 mg, 81 mg, Oral, Daily, Cox, Amy N, DO, 81 mg at 03/22/21 1027 .  carbidopa-levodopa (SINEMET IR) 25-100 MG per tablet immediate release 1 tablet, 1 tablet, Oral, TID, Cox, Amy N, DO, 1 tablet  at 03/22/21 1028 .  cefTRIAXone (ROCEPHIN) 2 g in sodium chloride 0.9 % 100 mL IVPB, 2 g, Intravenous, Q24H, Shah, Vipul, MD .  enoxaparin (LOVENOX) injection 47.5 mg, 0.5 mg/kg, Subcutaneous, Q24H, Cox, Amy N, DO, 47.5 mg at 03/21/21 2122 .  Eszopiclone 3 mg, 3 mg, Oral, QHS PRN, Max Sane, MD, 3 mg at 03/21/21 2123 .  fluticasone (FLONASE) 50 MCG/ACT nasal spray 2 spray, 2 spray, Each Nare, Daily PRN, Cox, Amy N, DO .  fluticasone furoate-vilanterol (BREO ELLIPTA) 100-25 MCG/INH 1 puff, 1 puff, Inhalation, Daily, 1 puff at 03/22/21 1032 **AND** umeclidinium bromide (INCRUSE ELLIPTA) 62.5 MCG/INH 1 puff, 1 puff, Inhalation, Daily, Dorothe Pea, RPH, 1 puff at 03/22/21 1032 .  furosemide (LASIX) injection 40 mg, 40 mg, Intravenous, Once, Manuella Ghazi, Vipul, MD .  gabapentin (NEURONTIN) capsule 400 mg, 400 mg, Oral, TID, Sreenath, Sudheer B, MD, 400 mg at 03/22/21 1029 .  guaiFENesin (MUCINEX) 12 hr tablet 600 mg, 600 mg, Oral, BID, Sreenath, Sudheer B, MD, 600 mg at 03/22/21 1027 .   guaiFENesin-dextromethorphan (ROBITUSSIN DM) 100-10 MG/5ML syrup 5 mL, 5 mL, Oral, Q4H PRN, Cox, Amy N, DO, 5 mL at 03/21/21 2123 .  melatonin tablet 5 mg, 5 mg, Oral, QHS PRN, Dwyane Dee, MD, 5 mg at 03/20/21 2132 .  metFORMIN (GLUCOPHAGE) tablet 500 mg, 500 mg, Oral, Q breakfast, Cox, Amy N, DO, 500 mg at 03/22/21 1033 .  methylPREDNISolone sodium succinate (SOLU-MEDROL) 125 mg/2 mL injection 60 mg, 60 mg, Intravenous, Q12H, Manuella Ghazi, Vipul, MD .  metoprolol tartrate (LOPRESSOR) tablet 25 mg, 25 mg, Oral, BID, Cox, Amy N, DO, 25 mg at 03/22/21 1034 .  multivitamin with minerals tablet 1 tablet, 1 tablet, Oral, Daily, Cox, Amy N, DO, 1 tablet at 03/22/21 1029 .  ondansetron (ZOFRAN) tablet 4 mg, 4 mg, Oral, Q6H PRN **OR** ondansetron (ZOFRAN) injection 4 mg, 4 mg, Intravenous, Q6H PRN, Cox, Amy N, DO .  pantoprazole (PROTONIX) EC tablet 80 mg, 80 mg, Oral, Q1200, Cox, Amy N, DO, 80 mg at 03/22/21 1042 .  pravastatin (PRAVACHOL) tablet 80 mg, 80 mg, Oral, QHS, Cox, Amy N, DO, 80 mg at 03/21/21 2122 .  senna-docusate (Senokot-S) tablet 2 tablet, 2 tablet, Oral, BID, Manuella Ghazi, Vipul, MD .  venlafaxine XR (EFFEXOR-XR) 24 hr capsule 150 mg, 150 mg, Oral, Q breakfast, Cox, Amy N, DO, 150 mg at 03/22/21 1030 .  vitamin B-12 (CYANOCOBALAMIN) tablet 1,000 mcg, 1,000 mcg, Oral, Daily, Cox, Amy N, DO, 1,000 mcg at 03/22/21 1034    ALLERGIES   Diclofenac-misoprostol, Nsaids, Zolpidem, Hydrocodone-acetaminophen, and Simvastatin     REVIEW OF SYSTEMS    Review of Systems:  Gen:  Denies  fever, sweats, chills weigh loss  HEENT: Denies blurred vision, double vision, ear pain, eye pain, hearing loss, nose bleeds, sore throat Cardiac:  No dizziness, chest pain or heaviness, chest tightness,edema Resp:  + cough or sputum porduction, + shortness of breath, + wheezing, hemoptysis,  Gi: Denies swallowing difficulty, stomach pain, nausea or vomiting, diarrhea, constipation, bowel incontinence Gu:  Denies  bladder incontinence, burning urine Ext:   Denies Joint pain, stiffness or swelling Skin: Denies  skin rash, easy bruising or bleeding or hives Endoc:  Denies polyuria, polydipsia , polyphagia or weight change Psych:   Denies depression, insomnia or hallucinations   Other:  All other systems negative   VS: BP 140/88   Pulse (!) 40   Temp 98.2 F (36.8 C)   Resp 20   Ht  5\' 3"  (1.6 m)   Wt 93.9 kg   SpO2 94%   BMI 36.67 kg/m      PHYSICAL EXAM    GENERAL:large lady, [place] in place at 4 liters HEAD: Normocephalic, atraumatic.  EYES: Pupils equal, round, reactive to light. Extraocular muscles intact. No scleral icterus.  MOUTH: Moist mucosal membrane. Dentition intact. No abscess noted.  EAR, NOSE, THROAT: Clear without exudates. No external lesions.  NECK: Supple. No thyromegaly. No nodules. No JVD.  PULMONARY:  + wheezing, minimum use of accessory muscles CARDIOVASCULAR: S1 and S2. Regular rate and rhythm. No murmurs, rubs, or gallops. No edema. Pedal pulses 2+ bilaterally.  GASTROINTESTINAL: Soft, nontender, nondistended. No masses. Positive bowel sounds. No hepatosplenomegaly.  MUSCULOSKELETAL: No swelling, clubbing, or edema. Range of motion full in all extremities.  NEUROLOGIC: Cranial nerves II through XII are intact. No gross focal neurological deficits. Sensation intact. Reflexes intact.  SKIN: No ulceration, lesions, rashes, or cyanosis. Skin warm and dry. Turgor intact.  PSYCHIATRIC: Mood, affect within normal limits. The patient is awake, alert and oriented x 3. Insight, judgment intact.       IMAGING    DG Chest 2 View  Result Date: 03/19/2021 CLINICAL DATA:  Shortness of breath EXAM: CHEST - 2 VIEW COMPARISON:  July 16, 2019 chest radiograph; chest CT Feb 27, 2021 FINDINGS: There is atelectatic change in the left mid lung. The lungs elsewhere are clear. The heart size and pulmonary vascularity are normal. No adenopathy. There is aortic atherosclerosis. There is  postop change in the lower cervical region. IMPRESSION: Left midlung atelectasis. Lungs elsewhere clear. Cardiac silhouette within normal limits. Aortic Atherosclerosis (ICD10-I70.0). Electronically Signed   By: Lowella Grip III M.D.   On: 03/19/2021 13:32   CT Chest Wo Contrast  Result Date: 03/19/2021 CLINICAL DATA:  Persistent cough, shortness of breath, history of lung cancer EXAM: CT CHEST WITHOUT CONTRAST TECHNIQUE: Multidetector CT imaging of the chest was performed following the standard protocol without IV contrast. COMPARISON:  02/27/2021 FINDINGS: Cardiovascular: Aortic atherosclerosis. Normal heart size. Three-vessel coronary artery calcifications. No pericardial effusion. Mediastinum/Nodes: Slight interval enlargement of multiple mediastinal lymph nodes, subcarinal lymph node measuring 2.7 x 2.2 cm, previously 2.4 x 1.7 cm (series 2, image 65). Thyroid gland, trachea, and esophagus demonstrate no significant findings. Lungs/Pleura: Diffuse bilateral bronchial wall thickening fall with scattered bronchial plugging. There has been a very substantial interval worsening of diffuse centrilobular and tree-in-bud pulmonary nodularity throughout the lungs. Unchanged, predominantly bandlike post treatment appearance of a lingular nodule. No pleural effusion or pneumothorax. Upper Abdomen: No acute abnormality. Musculoskeletal: No chest wall mass or suspicious bone lesions identified. IMPRESSION: 1. There has been a very substantial interval worsening of diffuse centrilobular and tree-in-bud pulmonary nodularity throughout the lungs, consistent with worsened atypical infection, particularly atypical Mycobacterium. 2. Diffuse bilateral bronchial wall thickening and scattered bronchial plugging, consistent with nonspecific infectious or inflammatory bronchitis. 3. Slight interval enlargement of multiple mediastinal lymph nodes, likely reactive to infection. 4. Unchanged, predominantly bandlike post  treatment appearance of a lingular nodule compared to recent staging CT. 5. Coronary artery disease. Aortic Atherosclerosis (ICD10-I70.0). Electronically Signed   By: Eddie Candle M.D.   On: 03/19/2021 15:57   CT Chest W Contrast  Result Date: 02/28/2021 CLINICAL DATA:  Left non-small cell lung cancer restaging, status post chemotherapy and radiation EXAM: CT CHEST WITH CONTRAST TECHNIQUE: Multidetector CT imaging of the chest was performed during intravenous contrast administration. CONTRAST:  15mL OMNIPAQUE IOHEXOL 300 MG/ML  SOLN COMPARISON:  08/29/2020 FINDINGS: Cardiovascular: Aortic atherosclerosis. Normal heart size. Three-vessel coronary artery calcifications. No pericardial effusion. Mediastinum/Nodes: No enlarged mediastinal, hilar, or axillary lymph nodes. Thyroid gland, trachea, and esophagus demonstrate no significant findings. Lungs/Pleura: Mild centrilobular emphysema. Diffuse bilateral bronchial wall thickening. No significant interval change in post treatment appearance of a nodule of the lingula, measuring approximately 1.7 x 1.4 cm with adjacent bandlike scarring and fibrosis (series 3, image 73). There is some fluctuant centrilobular nodularity in the posterior lingula which is generally improved compared to prior examination. Unchanged 4 mm nodule of the left pulmonary apex (series 3, image 25). No pleural effusion or pneumothorax. Upper Abdomen: No acute abnormality. Musculoskeletal: No chest wall mass or suspicious bone lesions identified. IMPRESSION: 1. No significant interval change in post treatment appearance of a nodule of the lingula, measuring approximately 1.7 x 1.4 cm with adjacent bandlike scarring and fibrosis. 2. There is some fluctuant centrilobular nodularity in the posterior lingula which is generally improved compared to prior examination, consistent with improved although ongoing atypical infection. 3. Unchanged 4 mm nodule of the left pulmonary apex, nonspecific, most  likely infectious or inflammatory. Continued attention on follow-up. 4. Emphysema. 5. Diffuse bilateral bronchial wall thickening, consistent with nonspecific infectious or inflammatory bronchitis. 6. Coronary artery disease. Aortic Atherosclerosis (ICD10-I70.0) and Emphysema (ICD10-J43.9). Electronically Signed   By: Eddie Candle M.D.   On: 02/28/2021 08:22      ASSESSMENT/PLAN  Today she is more bronchospatic. Does not appear wet. fio2 at 4 liters. On dvt prophylaxis -increase solumedrol to 60 mg q 12 hrs -incentive spirometry -nebs as ordered -increase activity ( pt) -consider one dose of lasix -cxr in am      Thank you for allowing me to participate in the care of this patient.   Patient/Family are satisfied with care plan and all questions have been answered.  This document was prepared using Dragon voice recognition software and may include unintentional dictation errors.     Wallene Huh, M.D.  Division of [place]

## 2021-03-23 ENCOUNTER — Inpatient Hospital Stay: Payer: Medicare HMO

## 2021-03-23 DIAGNOSIS — R0602 Shortness of breath: Secondary | ICD-10-CM

## 2021-03-23 LAB — BASIC METABOLIC PANEL
Anion gap: 11 (ref 5–15)
BUN: 24 mg/dL — ABNORMAL HIGH (ref 8–23)
CO2: 39 mmol/L — ABNORMAL HIGH (ref 22–32)
Calcium: 9.3 mg/dL (ref 8.9–10.3)
Chloride: 85 mmol/L — ABNORMAL LOW (ref 98–111)
Creatinine, Ser: 0.72 mg/dL (ref 0.44–1.00)
GFR, Estimated: >60 mL/min (ref >60)
Glucose, Bld: 219 mg/dL — ABNORMAL HIGH (ref 70–99)
Potassium: 3.9 mmol/L (ref 3.5–5.1)
Sodium: 135 mmol/L (ref 135–145)

## 2021-03-23 LAB — CULTURE, RESPIRATORY W GRAM STAIN: Culture: NORMAL

## 2021-03-23 LAB — CBC
HCT: 41.1 % (ref 36.0–46.0)
Hemoglobin: 13.1 g/dL (ref 12.0–15.0)
MCH: 29.8 pg (ref 26.0–34.0)
MCHC: 31.9 g/dL (ref 30.0–36.0)
MCV: 93.4 fL (ref 80.0–100.0)
Platelets: 362 10*3/uL (ref 150–400)
RBC: 4.4 MIL/uL (ref 3.87–5.11)
RDW: 14.3 % (ref 11.5–15.5)
WBC: 12.8 10*3/uL — ABNORMAL HIGH (ref 4.0–10.5)
nRBC: 0.2 % (ref 0.0–0.2)

## 2021-03-23 LAB — QUANTIFERON-TB GOLD PLUS (RQFGPL)
QuantiFERON Mitogen Value: 0.41 IU/mL
QuantiFERON Nil Value: 0 IU/mL
QuantiFERON TB1 Ag Value: 0.02 IU/mL
QuantiFERON TB2 Ag Value: 0.01 IU/mL

## 2021-03-23 LAB — QUANTIFERON-TB GOLD PLUS: QuantiFERON-TB Gold Plus: UNDETERMINED — AB

## 2021-03-23 MED ORDER — PREDNISONE 10 MG (21) PO TBPK: ORAL_TABLET | ORAL | 0 refills | Status: DC

## 2021-03-23 MED ORDER — ASPIRIN 81 MG PO CHEW: 81.0000 mg | CHEWABLE_TABLET | Freq: Every day | ORAL | 0 refills | Status: AC

## 2021-03-23 MED ORDER — GABAPENTIN 400 MG PO CAPS: 400.0000 mg | ORAL_CAPSULE | Freq: Three times a day (TID) | ORAL | 0 refills | Status: DC

## 2021-03-23 NOTE — Progress Notes (Signed)
Mobility Specialist - Progress Note   03/23/21 1100  Mobility  Activity Ambulated in hall  Level of Assistance Standby assist, set-up cues, supervision of patient - no hands on  Assistive Device None  Distance Ambulated (ft) 180 ft  Mobility Ambulated with assistance in hallway  Mobility Response Tolerated well  Mobility performed by Mobility specialist  $Mobility charge 1 Mobility    Pre-mobility: 80 HR, 95% SpO2 During mobility: 94 HR, 88% SpO2 Post-mobility: 84 HR, 92% SpO2   Pt lying in bed upon arrival, utilizing 2L for session. Pt ambulated in hallway with supervision. Denied dizziness, but does voice feeling "woozy" during ambulation. No LOB. One standing rest break taken. Mild SOB. O2 desat to a low of 87% during session. PLB engaged throughout session. Pt returned supine, anticipating d/c later this date.    Kathee Delton Mobility Specialist 03/23/21, 11:40 AM

## 2021-03-23 NOTE — Care Management Important Message (Signed)
Important Message  Patient Details  Name: Suzanne Glenn MRN: [id number] Date of Birth: 01/05/1951   Medicare Important Message Given:  No  Patient discharged prior to arrival to unit to deliver concurrent Medicare IM.  Initial Medicare IM reviewed with patient's spouse on 03/20/2021 by S. Geanie Cooley, Patient Special educational needs teacher.     Dannette Barbara 03/23/2021, 12:30 PM

## 2021-03-23 NOTE — TOC Transition Note (Signed)
Transition of Care [facility]) - CM/SW Discharge Note   Patient Details  Name: Suzanne Glenn MRN: [id number] Date of Birth: December 13, 1950  [facility] CM/SW Contact:  Candie Chroman, LCSW Phone Number: 03/23/2021, 10:30 AM   Clinical Narrative: Patient has orders to discharge home today. She is now on 2 L oxygen which is her baseline. No further concerns. CSW signing off.    Final next level of care: Home/Self Care Barriers to Discharge: Barriers Resolved   Patient Goals and CMS Choice        Discharge Placement                    Patient and family notified of of transfer: 03/23/21  Discharge Plan and Services     Post Acute Care Choice: NA                               Social Determinants of Health (SDOH) Interventions     Readmission Risk Interventions Readmission Risk Prevention Plan 03/21/2021  Transportation Screening Complete  PCP or Specialist Appt within 3-5 Days Complete  Social Work Consult for [place] Planning/Counseling [place] Not Applicable  Medication Review [employer] photographer) Complete  Some recent data might be hidden

## 2021-03-25 LAB — CULTURE, BLOOD (ROUTINE X 2)
Culture: NO GROWTH
Culture: NO GROWTH
Special Requests: ADEQUATE

## 2021-03-26 NOTE — Discharge Summary (Signed)
[facility] [place] NAME: Suzanne Glenn    MR#:  [id number]  DATE OF BIRTH:  Jul 27, 1951  DATE OF ADMISSION:  03/19/2021   ADMITTING PHYSICIAN: Sidney Ace, MD  DATE OF DISCHARGE: 03/23/2021 12:01 PM  PRIMARY CARE PHYSICIAN: Tracie Harrier, MD   ADMISSION DIAGNOSIS:  Atypical pneumonia [J18.9] Acute on chronic respiratory failure with hypoxia ([place]) [J96.21] DISCHARGE DIAGNOSIS:  Principal Problem:   Atypical pneumonia Active Problems:   Fibromyalgia   Coronary artery disease   Benign essential HTN   COPD, moderate (HCC)   Clinical depression   Gastro-esophageal reflux disease without esophagitis   GAD (generalized anxiety disorder)   Depression, major, recurrent, moderate (HCC)   SOB (shortness of breath)   Essential (primary) hypertension   Pure hypercholesterolemia   Restless leg syndrome   Tobacco use   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HCC)   Chronic respiratory failure with hypoxia (HCC)   MDD (major depressive disorder), recurrent, in full remission ([place])   Acute on chronic respiratory failure with hypoxia ([place])  SECONDARY DIAGNOSIS:   Past Medical History:  Diagnosis Date  . Anxiety   . Asthma   . Cancer ([place]) 12/2018   w/u for right upper lobe mass/cancer  . COPD (chronic obstructive pulmonary disease) (HCC)    also, emphysema. now using o2 via np 24 hours a day  . Coronary artery disease   . Depression   . Diabetes mellitus, type II ([place])   . GERD (gastroesophageal reflux disease)   . Headache    migraines in early 20's  . Heart murmur   . Hypertension   . Lung cancer ([place])   . Neuropathy   . Restless leg    HOSPITAL COURSE:  70 y.o. 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   Atypical pneumonia Acute on chronic hypoxic respiratory failure COPD with acute exacerbation Patient with complex pulmonary history Sees Dr. Raul Del as outpatient Chest CT with worsened tree-in-bud nodularity concerning for atypical infection Based on chronic 2 L required up to 4 L on admission and weaned off to 2 L after treatment Continue home trilogy Outpatient follow-up with pulmonary Dr. Raul Del  Treated with Rocephin and Zithromax and finished course Good response to bronchodilator regimen and steroids while in the hospital.  Counseled tobacco cessation Encouraged incentive spirometry and flutter valve use   Hypertension Controlled on home regimen   Hyperlipidemia pravastatin 80 mg nightly   Major depressive disorder venlafaxine 150 mg p.o. daily   Non-insulin-dependent diabetes mellitus Controlled on home regimen   GERD  PPI   Neuropathy Patient endorses some numbness in the face Continue gabapentin 400 mg p.o. 3 times daily at discharge as it seemed to help her symptoms   Parkinson disease Continue carbidopa-levodopa 25-100 mg p.o. daily 3 times daily   Tobacco abuse Counseled extensively on tobacco cessation Offered nicotine patch, patient declined States she is ready to stop smoking   History of squamous cell lung cancer No acute issues Completed chemotherapy/radiation in 2020  Constipation Resolved with bowel regimen  DISCHARGE CONDITIONS:  Stable CONSULTS OBTAINED:   DRUG ALLERGIES:   Allergies  Allergen Reactions  . Diclofenac-Misoprostol Other (See Comments)    Arthrotec - gastritis   . Nsaids Other (See Comments)    gastritis  .  Zolpidem Other (See Comments)    Sleep walking   . Hydrocodone-Acetaminophen Nausea And Vomiting  . Simvastatin Other (See Comments)    body aches   DISCHARGE MEDICATIONS:   Allergies as of 03/23/2021       Reactions   Diclofenac-misoprostol Other (See Comments)   Arthrotec - gastritis    Nsaids Other (See Comments)   gastritis   Zolpidem Other (See Comments)   Sleep walking    Hydrocodone-acetaminophen Nausea And Vomiting   Simvastatin Other (See Comments)   body aches        Medication List     STOP taking these medications    diclofenac sodium 1 % Gel Commonly known as: VOLTAREN   fluticasone 50 MCG/ACT nasal spray Commonly known as: FLONASE   gabapentin 600 MG tablet Commonly known as: Neurontin Replaced by: gabapentin 400 MG capsule   ipratropium 0.02 % nebulizer solution Commonly known as: ATROVENT       TAKE these medications    Accu-Chek FastClix Lancets Misc   Accu-Chek SmartView test strip Generic drug: glucose blood   albuterol 108 (90 Base) MCG/ACT inhaler Commonly known as: VENTOLIN HFA Inhale 2 puffs into the lungs every 4 (four) hours as needed for wheezing or shortness of breath. What changed: Another medication with the same name was removed. Continue taking this medication, and follow the directions you see here.   amLODipine 10 MG tablet Commonly known as: NORVASC Take 10 mg by mouth daily.   aspirin 81 MG chewable tablet Chew 1 tablet (81 mg total) by mouth daily.   carbidopa-levodopa 25-100 MG tablet Commonly known as: SINEMET IR Take 1 tablet by mouth 3 (three) times daily.   cyanocobalamin 1000 MCG tablet Take 1 tablet by mouth daily.   esomeprazole 40 MG capsule Commonly known as: NEXIUM Take 40 mg by mouth daily.   Eszopiclone 3 MG Tabs Commonly known as: eszopiclone Take 1 tablet (3 mg total) by mouth at bedtime as needed. Take immediately before bedtime   ferrous sulfate 325 (65 FE) MG tablet Take 1 tablet (325  mg total) by mouth 2 (two) times daily with a meal.   Fluticasone-Umeclidin-Vilant 100-62.5-25 MCG/INH Aepb Inhale 1 puff into the lungs daily. Treligy   gabapentin 400 MG capsule Commonly known as: NEURONTIN Take 1 capsule (400 mg total) by mouth 3 (three) times daily. Replaces: gabapentin 600 MG tablet   metFORMIN 500 MG tablet Commonly known as: GLUCOPHAGE Take 500 mg by mouth daily with breakfast.   metoprolol tartrate 25 MG tablet Commonly known as: LOPRESSOR Take 25 mg by mouth 2 (two) times daily.   multivitamin tablet Take 1 tablet by mouth daily.   OXYGEN Inhale 2 L into the lungs continuous.   potassium chloride 8 MEQ tablet Commonly known as: KLOR-CON Take 8 mEq by mouth daily.   pravastatin 80 MG tablet Commonly known as: PRAVACHOL Take 80 mg by mouth every evening.   predniSONE 10 MG (21) Tbpk tablet Commonly known as: STERAPRED UNI-PAK 21 TAB Start 60 mg po daily, taper 10 mg daily until finish   spironolactone 25 MG tablet Commonly known as: ALDACTONE Take 25 mg by mouth daily.   venlafaxine XR 150 MG 24 hr capsule  Commonly known as: EFFEXOR-XR Take 1 capsule (150 mg total) by mouth daily with breakfast.       DISCHARGE INSTRUCTIONS:   DIET:  Cardiac diet DISCHARGE CONDITION:  Stable ACTIVITY:  Activity as tolerated OXYGEN:  Home Oxygen: Yes.    Oxygen Delivery: 2 liters/min via Patient connected to nasal cannula oxygen DISCHARGE LOCATION:  home   If you experience worsening of your admission symptoms, develop shortness of breath, life threatening emergency, suicidal or homicidal thoughts you must seek medical attention immediately by calling 911 or calling your MD immediately  if symptoms less severe.  You Must read complete instructions/literature along with all the possible adverse reactions/side effects for all the Medicines you take and that have been prescribed to you. Take any new Medicines after you have completely understood and  accpet all the possible adverse reactions/side effects.   Please note  You were cared for by a hospitalist during your hospital stay. If you have any questions about your discharge medications or the care you received while you were in the hospital after you are discharged, you can call the unit and asked to speak with the hospitalist on call if the hospitalist that took care of you is not available. Once you are discharged, your primary care physician will handle any further medical issues. Please note that NO REFILLS for any discharge medications will be authorized once you are discharged, as it is imperative that you return to your primary care physician (or establish a relationship with a primary care physician if you do not have one) for your aftercare needs so that they can reassess your need for medications and monitor your lab values.    On the day of Discharge:  VITAL SIGNS:  Blood pressure (!) 141/85, pulse 100, temperature 97.8 F (36.6 C), temperature source Oral, resp. rate 19, height 5\' 3"  (1.6 m), weight 93.9 kg, SpO2 92 %. PHYSICAL EXAMINATION:  GENERAL:  70 y.o.-year-old patient lying in the bed with no acute distress.  EYES: Pupils equal, round, reactive to light and accommodation. No scleral icterus. Extraocular muscles intact.  HEENT: Head atraumatic, normocephalic. Oropharynx and nasopharynx clear.  NECK:  Supple, no jugular venous distention. No thyroid enlargement, no tenderness.  LUNGS: Normal breath sounds bilaterally, no wheezing, rales,rhonchi or crepitation. No use of accessory muscles of respiration.  CARDIOVASCULAR: S1, S2 normal. No murmurs, rubs, or gallops.  ABDOMEN: Soft, non-tender, non-distended. Bowel sounds present. No organomegaly or mass.  EXTREMITIES: No pedal edema, cyanosis, or clubbing.  NEUROLOGIC: Cranial nerves II through XII are intact. Muscle strength 5/5 in all extremities. Sensation intact. Gait not checked.  PSYCHIATRIC: The patient is alert  and oriented x 3.  SKIN: No obvious rash, lesion, or ulcer.  DATA REVIEW:   CBC Recent Labs  Lab 03/23/21 0401  WBC 12.8*  HGB 13.1  HCT 41.1  PLT 362    Chemistries  Recent Labs  Lab 03/23/21 0401  NA 135  K 3.9  CL 85*  CO2 39*  GLUCOSE 219*  BUN 24*  CREATININE 0.72  CALCIUM 9.3     Outpatient follow-up  Follow-up Information     Tracie Harrier, MD. Schedule an appointment as soon as possible for a visit on 03/31/2021.   Specialty: Internal Medicine Why: [facility] Discharge F/UP 1:30pm appointment Contact information: [address] [place] [address] [phone]         Erby Pian, MD. Go on 03/30/2021.   Specialty: Specialist Why: Post  Hospital Discharge F/UP 3:15pm appointment Contact information: [place] [address] [phone]                 72 Day Unplanned Readmission Risk Score    Flowsheet Row ED to Hosp-Admission (Discharged) from 03/19/2021 in [facility]  30 Day Unplanned Readmission Risk Score (%) 22.43 Filed at 03/23/2021 0801       This score is the patient's risk of an unplanned readmission within 30 days of being discharged (0 -100%). The score is based on dignosis, age, lab data, medications, orders, and past utilization.   Low:  0-14.9   Medium: 15-21.9   High: 22-29.9   Extreme: 30 and above           Management plans discussed with the patient, family and they are in agreement.  CODE STATUS: Prior   TOTAL TIME TAKING CARE OF THIS PATIENT: 45 minutes.    Max Sane M.D on 03/26/2021 at 1:42 PM  [facility]   CC: Primary care physician; Tracie Harrier, MD   Note: This dictation was prepared with Dragon dictation along with smaller phrase technology. Any transcriptional errors that result from this process are unintentional.

## 2021-03-30 DIAGNOSIS — R0602 Shortness of breath: Secondary | ICD-10-CM | POA: Diagnosis not present

## 2021-03-30 DIAGNOSIS — Z85118 Personal history of other malignant neoplasm of bronchus and lung: Secondary | ICD-10-CM | POA: Diagnosis not present

## 2021-03-30 DIAGNOSIS — R9389 Abnormal findings on diagnostic imaging of other specified body structures: Secondary | ICD-10-CM | POA: Diagnosis not present

## 2021-03-31 DIAGNOSIS — J189 Pneumonia, unspecified organism: Secondary | ICD-10-CM | POA: Diagnosis not present

## 2021-03-31 DIAGNOSIS — J449 Chronic obstructive pulmonary disease, unspecified: Secondary | ICD-10-CM | POA: Diagnosis not present

## 2021-03-31 DIAGNOSIS — E119 Type 2 diabetes mellitus without complications: Secondary | ICD-10-CM | POA: Diagnosis not present

## 2021-03-31 DIAGNOSIS — I10 Essential (primary) hypertension: Secondary | ICD-10-CM | POA: Diagnosis not present

## 2021-03-31 DIAGNOSIS — Z09 Encounter for follow-up examination after completed treatment for conditions other than malignant neoplasm: Secondary | ICD-10-CM | POA: Diagnosis not present

## 2021-04-14 ENCOUNTER — Other Ambulatory Visit: Payer: Self-pay

## 2021-04-14 ENCOUNTER — Emergency Department: Payer: Medicare HMO

## 2021-04-14 ENCOUNTER — Inpatient Hospital Stay
Admission: EM | Admit: 2021-04-14 | Discharge: 2021-04-17 | DRG: 190 | Disposition: A | Discharge status: Home / Self Care | Payer: Medicare HMO | Attending: Internal Medicine | Admitting: Internal Medicine

## 2021-04-14 ENCOUNTER — Encounter: Payer: Self-pay | Admitting: Emergency Medicine

## 2021-04-14 DIAGNOSIS — R0602 Shortness of breath: Secondary | ICD-10-CM | POA: Diagnosis not present

## 2021-04-14 DIAGNOSIS — Z720 Tobacco use: Secondary | ICD-10-CM | POA: Diagnosis not present

## 2021-04-14 DIAGNOSIS — I10 Essential (primary) hypertension: Secondary | ICD-10-CM | POA: Diagnosis not present

## 2021-04-14 DIAGNOSIS — Z83438 Family history of other disorder of lipoprotein metabolism and other lipidemia: Secondary | ICD-10-CM

## 2021-04-14 DIAGNOSIS — Z9981 Dependence on supplemental oxygen: Secondary | ICD-10-CM | POA: Diagnosis not present

## 2021-04-14 DIAGNOSIS — Z888 Allergy status to other drugs, medicaments and biological substances status: Secondary | ICD-10-CM

## 2021-04-14 DIAGNOSIS — Z818 Family history of other mental and behavioral disorders: Secondary | ICD-10-CM

## 2021-04-14 DIAGNOSIS — Z955 Presence of coronary angioplasty implant and graft: Secondary | ICD-10-CM

## 2021-04-14 DIAGNOSIS — Z9049 Acquired absence of other specified parts of digestive tract: Secondary | ICD-10-CM

## 2021-04-14 DIAGNOSIS — Z20822 Contact with and (suspected) exposure to covid-19: Secondary | ICD-10-CM | POA: Diagnosis present

## 2021-04-14 DIAGNOSIS — Z79899 Other long term (current) drug therapy: Secondary | ICD-10-CM

## 2021-04-14 DIAGNOSIS — J189 Pneumonia, unspecified organism: Secondary | ICD-10-CM | POA: Diagnosis not present

## 2021-04-14 DIAGNOSIS — F17210 Nicotine dependence, cigarettes, uncomplicated: Secondary | ICD-10-CM | POA: Diagnosis not present

## 2021-04-14 DIAGNOSIS — F411 Generalized anxiety disorder: Secondary | ICD-10-CM | POA: Diagnosis present

## 2021-04-14 DIAGNOSIS — J441 Chronic obstructive pulmonary disease with (acute) exacerbation: Secondary | ICD-10-CM | POA: Diagnosis not present

## 2021-04-14 DIAGNOSIS — G2581 Restless legs syndrome: Secondary | ICD-10-CM | POA: Diagnosis present

## 2021-04-14 DIAGNOSIS — Z833 Family history of diabetes mellitus: Secondary | ICD-10-CM | POA: Diagnosis not present

## 2021-04-14 DIAGNOSIS — K219 Gastro-esophageal reflux disease without esophagitis: Secondary | ICD-10-CM | POA: Diagnosis not present

## 2021-04-14 DIAGNOSIS — M797 Fibromyalgia: Secondary | ICD-10-CM | POA: Diagnosis present

## 2021-04-14 DIAGNOSIS — Z825 Family history of asthma and other chronic lower respiratory diseases: Secondary | ICD-10-CM

## 2021-04-14 DIAGNOSIS — E785 Hyperlipidemia, unspecified: Secondary | ICD-10-CM | POA: Diagnosis not present

## 2021-04-14 DIAGNOSIS — Z716 Tobacco abuse counseling: Secondary | ICD-10-CM

## 2021-04-14 DIAGNOSIS — Z7951 Long term (current) use of inhaled steroids: Secondary | ICD-10-CM

## 2021-04-14 DIAGNOSIS — D509 Iron deficiency anemia, unspecified: Secondary | ICD-10-CM | POA: Diagnosis not present

## 2021-04-14 DIAGNOSIS — Z85118 Personal history of other malignant neoplasm of bronchus and lung: Secondary | ICD-10-CM

## 2021-04-14 DIAGNOSIS — F32A Depression, unspecified: Secondary | ICD-10-CM | POA: Diagnosis present

## 2021-04-14 DIAGNOSIS — I2510 Atherosclerotic heart disease of native coronary artery without angina pectoris: Secondary | ICD-10-CM | POA: Diagnosis present

## 2021-04-14 DIAGNOSIS — Z885 Allergy status to narcotic agent status: Secondary | ICD-10-CM

## 2021-04-14 DIAGNOSIS — Z8249 Family history of ischemic heart disease and other diseases of the circulatory system: Secondary | ICD-10-CM

## 2021-04-14 DIAGNOSIS — J9621 Acute and chronic respiratory failure with hypoxia: Secondary | ICD-10-CM | POA: Diagnosis not present

## 2021-04-14 DIAGNOSIS — E119 Type 2 diabetes mellitus without complications: Secondary | ICD-10-CM | POA: Diagnosis not present

## 2021-04-14 DIAGNOSIS — A419 Sepsis, unspecified organism: Secondary | ICD-10-CM | POA: Diagnosis not present

## 2021-04-14 DIAGNOSIS — T380X5A Adverse effect of glucocorticoids and synthetic analogues, initial encounter: Secondary | ICD-10-CM | POA: Diagnosis present

## 2021-04-14 DIAGNOSIS — E1165 Type 2 diabetes mellitus with hyperglycemia: Secondary | ICD-10-CM | POA: Diagnosis present

## 2021-04-14 DIAGNOSIS — Z981 Arthrodesis status: Secondary | ICD-10-CM

## 2021-04-14 DIAGNOSIS — Z886 Allergy status to analgesic agent status: Secondary | ICD-10-CM

## 2021-04-14 DIAGNOSIS — Z7984 Long term (current) use of oral hypoglycemic drugs: Secondary | ICD-10-CM

## 2021-04-14 DIAGNOSIS — Z7982 Long term (current) use of aspirin: Secondary | ICD-10-CM

## 2021-04-14 LAB — LACTIC ACID, PLASMA
Lactic Acid, Venous: 1.1 mmol/L (ref 0.5–1.9)
Lactic Acid, Venous: 2.7 mmol/L (ref 0.5–1.9)

## 2021-04-14 LAB — CBC WITH DIFFERENTIAL/PLATELET
Abs Immature Granulocytes: 0.12 10*3/uL — ABNORMAL HIGH (ref 0.00–0.07)
Basophils Absolute: 0.1 10*3/uL (ref 0.0–0.1)
Basophils Relative: 1 %
Eosinophils Absolute: 0.1 10*3/uL (ref 0.0–0.5)
Eosinophils Relative: 1 %
HCT: 38.2 % (ref 36.0–46.0)
Hemoglobin: 11.9 g/dL — ABNORMAL LOW (ref 12.0–15.0)
Immature Granulocytes: 1 %
Lymphocytes Relative: 9 %
Lymphs Abs: 0.8 10*3/uL (ref 0.7–4.0)
MCH: 29.8 pg (ref 26.0–34.0)
MCHC: 31.2 g/dL (ref 30.0–36.0)
MCV: 95.5 fL (ref 80.0–100.0)
Monocytes Absolute: 1.2 10*3/uL — ABNORMAL HIGH (ref 0.1–1.0)
Monocytes Relative: 13 %
Neutro Abs: 6.6 10*3/uL (ref 1.7–7.7)
Neutrophils Relative %: 75 %
Platelets: 298 10*3/uL (ref 150–400)
RBC: 4 MIL/uL (ref 3.87–5.11)
RDW: 15.2 % (ref 11.5–15.5)
Smear Review: NORMAL
WBC: 8.9 10*3/uL (ref 4.0–10.5)
nRBC: 0 % (ref 0.0–0.2)

## 2021-04-14 LAB — COMPREHENSIVE METABOLIC PANEL
ALT: 9 U/L (ref 0–44)
AST: 20 U/L (ref 15–41)
Albumin: 3.5 g/dL (ref 3.5–5.0)
Alkaline Phosphatase: 79 U/L (ref 38–126)
Anion gap: 8 (ref 5–15)
BUN: 13 mg/dL (ref 8–23)
CO2: 37 mmol/L — ABNORMAL HIGH (ref 22–32)
Calcium: 9.7 mg/dL (ref 8.9–10.3)
Chloride: 93 mmol/L — ABNORMAL LOW (ref 98–111)
Creatinine, Ser: 0.79 mg/dL (ref 0.44–1.00)
GFR, Estimated: >60 mL/min (ref >60)
Glucose, Bld: 163 mg/dL — ABNORMAL HIGH (ref 70–99)
Potassium: 4.8 mmol/L (ref 3.5–5.1)
Sodium: 138 mmol/L (ref 135–145)
Total Bilirubin: 0.4 mg/dL (ref 0.3–1.2)
Total Protein: 7.9 g/dL (ref 6.5–8.1)

## 2021-04-14 LAB — BRAIN NATRIURETIC PEPTIDE: B Natriuretic Peptide: 167.2 pg/mL — ABNORMAL HIGH (ref 0.0–100.0)

## 2021-04-14 LAB — BLOOD GAS, VENOUS
Acid-Base Excess: 14 mmol/L — ABNORMAL HIGH (ref 0.0–2.0)
Bicarbonate: 43.4 mmol/L — ABNORMAL HIGH (ref 20.0–28.0)
O2 Saturation: 77 %
Patient temperature: 37
pCO2, Ven: 75 mmHg (ref 44.0–60.0)
pH, Ven: 7.37 (ref 7.250–7.430)
pO2, Ven: 43 mmHg (ref 32.0–45.0)

## 2021-04-14 LAB — RESP PANEL BY RT-PCR (FLU A&B, COVID) ARPGX2
Influenza A by PCR: NEGATIVE
Influenza B by PCR: NEGATIVE
SARS Coronavirus 2 by RT PCR: NEGATIVE

## 2021-04-14 LAB — PROCALCITONIN: Procalcitonin: 0.1 ng/mL

## 2021-04-14 LAB — GLUCOSE, CAPILLARY
Glucose-Capillary: 199 mg/dL — ABNORMAL HIGH (ref 70–99)
Glucose-Capillary: 299 mg/dL — ABNORMAL HIGH (ref 70–99)

## 2021-04-14 LAB — TROPONIN I (HIGH SENSITIVITY): Troponin I (High Sensitivity): 11 ng/L (ref <18)

## 2021-04-14 MED ORDER — CARBIDOPA-LEVODOPA 25-100 MG PO TABS
1.0000 | ORAL_TABLET | Freq: Three times a day (TID) | ORAL | Status: DC
  Administered 2021-04-14 – 2021-04-17 (×8): 1 via ORAL
  Filled 2021-04-14 (×11): qty 1

## 2021-04-14 MED ORDER — SODIUM CHLORIDE 0.9 % IV SOLN: 500.0000 mg | Freq: Once | INTRAVENOUS | Status: DC

## 2021-04-14 MED ORDER — PANTOPRAZOLE SODIUM 40 MG PO TBEC
40.0000 mg | DELAYED_RELEASE_TABLET | Freq: Every day | ORAL | Status: DC
  Administered 2021-04-15 – 2021-04-17 (×3): 40 mg via ORAL
  Filled 2021-04-14 (×3): qty 1

## 2021-04-14 MED ORDER — ALBUTEROL SULFATE (2.5 MG/3ML) 0.083% IN NEBU: 2.5000 mg | INHALATION_SOLUTION | RESPIRATORY_TRACT | Status: DC | PRN

## 2021-04-14 MED ORDER — SODIUM CHLORIDE 0.9 % IV BOLUS
500.0000 mL | Freq: Once | INTRAVENOUS | Status: AC
  Administered 2021-04-14: 500 mL via INTRAVENOUS

## 2021-04-14 MED ORDER — AZITHROMYCIN 1 G PO PACK: 1.0000 g | PACK | Freq: Once | ORAL | Status: DC

## 2021-04-14 MED ORDER — DOXYCYCLINE HYCLATE 100 MG IV SOLR
200.0000 mg | Freq: Two times a day (BID) | INTRAVENOUS | Status: DC
  Filled 2021-04-14 (×2): qty 200

## 2021-04-14 MED ORDER — METOPROLOL TARTRATE 25 MG PO TABS
25.0000 mg | ORAL_TABLET | Freq: Two times a day (BID) | ORAL | Status: DC
  Administered 2021-04-14 – 2021-04-17 (×6): 25 mg via ORAL
  Filled 2021-04-14 (×7): qty 1

## 2021-04-14 MED ORDER — INSULIN ASPART 100 UNIT/ML IJ SOLN
0.0000 [IU] | Freq: Three times a day (TID) | INTRAMUSCULAR | Status: DC
  Administered 2021-04-14: 2 [IU] via SUBCUTANEOUS
  Administered 2021-04-15: 7 [IU] via SUBCUTANEOUS
  Administered 2021-04-15: 2 [IU] via SUBCUTANEOUS
  Administered 2021-04-16 (×2): 3 [IU] via SUBCUTANEOUS
  Administered 2021-04-16 – 2021-04-17 (×2): 2 [IU] via SUBCUTANEOUS
  Filled 2021-04-14 (×7): qty 1

## 2021-04-14 MED ORDER — HYDRALAZINE HCL 20 MG/ML IJ SOLN: 5.0000 mg | INTRAMUSCULAR | Status: DC | PRN

## 2021-04-14 MED ORDER — IPRATROPIUM-ALBUTEROL 0.5-2.5 (3) MG/3ML IN SOLN
3.0000 mL | Freq: Once | RESPIRATORY_TRACT | Status: AC
  Administered 2021-04-14: 3 mL via RESPIRATORY_TRACT
  Filled 2021-04-14: qty 3

## 2021-04-14 MED ORDER — IPRATROPIUM-ALBUTEROL 0.5-2.5 (3) MG/3ML IN SOLN
3.0000 mL | RESPIRATORY_TRACT | Status: DC
  Administered 2021-04-14 – 2021-04-17 (×15): 3 mL via RESPIRATORY_TRACT
  Filled 2021-04-14 (×17): qty 3

## 2021-04-14 MED ORDER — ADULT MULTIVITAMIN W/MINERALS CH
1.0000 | ORAL_TABLET | Freq: Every day | ORAL | Status: DC
  Administered 2021-04-15 – 2021-04-16 (×2): 1 via ORAL
  Filled 2021-04-14 (×4): qty 1

## 2021-04-14 MED ORDER — ACETAMINOPHEN 325 MG PO TABS
650.0000 mg | ORAL_TABLET | Freq: Four times a day (QID) | ORAL | Status: DC | PRN
  Filled 2021-04-14: qty 2

## 2021-04-14 MED ORDER — AMLODIPINE BESYLATE 10 MG PO TABS
10.0000 mg | ORAL_TABLET | Freq: Every day | ORAL | Status: DC
  Administered 2021-04-15 – 2021-04-17 (×3): 10 mg via ORAL
  Filled 2021-04-14 (×3): qty 1

## 2021-04-14 MED ORDER — METHYLPREDNISOLONE SODIUM SUCC 125 MG IJ SOLR
125.0000 mg | Freq: Once | INTRAMUSCULAR | Status: AC
  Administered 2021-04-14: 125 mg via INTRAVENOUS
  Filled 2021-04-14: qty 2

## 2021-04-14 MED ORDER — METOPROLOL TARTRATE 25 MG PO TABS: 25.0000 mg | ORAL_TABLET | Freq: Two times a day (BID) | ORAL | Status: DC

## 2021-04-14 MED ORDER — SODIUM CHLORIDE 0.9 % IV SOLN
2.0000 g | Freq: Once | INTRAVENOUS | Status: AC
  Administered 2021-04-14: 2 g via INTRAVENOUS
  Filled 2021-04-14: qty 2

## 2021-04-14 MED ORDER — NICOTINE 21 MG/24HR TD PT24
21.0000 mg | MEDICATED_PATCH | Freq: Every day | TRANSDERMAL | Status: DC
  Administered 2021-04-15 – 2021-04-17 (×3): 21 mg via TRANSDERMAL
  Filled 2021-04-14 (×3): qty 1

## 2021-04-14 MED ORDER — DM-GUAIFENESIN ER 30-600 MG PO TB12
1.0000 | ORAL_TABLET | Freq: Two times a day (BID) | ORAL | Status: DC | PRN
  Administered 2021-04-15 (×2): 1 via ORAL
  Filled 2021-04-14 (×2): qty 1

## 2021-04-14 MED ORDER — ENOXAPARIN SODIUM 60 MG/0.6ML IJ SOSY
0.5000 mg/kg | PREFILLED_SYRINGE | INTRAMUSCULAR | Status: DC
  Administered 2021-04-14 – 2021-04-16 (×3): 45 mg via SUBCUTANEOUS
  Filled 2021-04-14 (×4): qty 0.45

## 2021-04-14 MED ORDER — SPIRONOLACTONE 25 MG PO TABS
25.0000 mg | ORAL_TABLET | Freq: Every day | ORAL | Status: DC
  Administered 2021-04-15 – 2021-04-17 (×3): 25 mg via ORAL
  Filled 2021-04-14 (×3): qty 1

## 2021-04-14 MED ORDER — VITAMIN B-12 1000 MCG PO TABS
1000.0000 ug | ORAL_TABLET | Freq: Every day | ORAL | Status: DC
  Administered 2021-04-15 – 2021-04-17 (×3): 1000 ug via ORAL
  Filled 2021-04-14 (×3): qty 1

## 2021-04-14 MED ORDER — SODIUM CHLORIDE 0.9 % IV SOLN
2.0000 g | Freq: Three times a day (TID) | INTRAVENOUS | Status: DC
  Administered 2021-04-14 – 2021-04-15 (×2): 2 g via INTRAVENOUS
  Filled 2021-04-14 (×4): qty 2

## 2021-04-14 MED ORDER — ASPIRIN 81 MG PO CHEW
81.0000 mg | CHEWABLE_TABLET | Freq: Every day | ORAL | Status: DC
  Administered 2021-04-15 – 2021-04-17 (×3): 81 mg via ORAL
  Filled 2021-04-14 (×3): qty 1

## 2021-04-14 MED ORDER — PRAVASTATIN SODIUM 40 MG PO TABS
80.0000 mg | ORAL_TABLET | Freq: Every evening | ORAL | Status: DC
  Administered 2021-04-14 – 2021-04-16 (×3): 80 mg via ORAL
  Filled 2021-04-14 (×3): qty 2

## 2021-04-14 MED ORDER — FERROUS SULFATE 325 (65 FE) MG PO TABS
325.0000 mg | ORAL_TABLET | Freq: Two times a day (BID) | ORAL | Status: DC
  Administered 2021-04-15 – 2021-04-16 (×4): 325 mg via ORAL
  Filled 2021-04-14 (×4): qty 1

## 2021-04-14 MED ORDER — ONDANSETRON HCL 4 MG/2ML IJ SOLN: 4.0000 mg | Freq: Three times a day (TID) | INTRAMUSCULAR | Status: DC | PRN

## 2021-04-14 MED ORDER — SODIUM CHLORIDE 0.9 % IV SOLN
100.0000 mg | Freq: Two times a day (BID) | INTRAVENOUS | Status: AC
  Administered 2021-04-15 (×2): 100 mg via INTRAVENOUS
  Filled 2021-04-14 (×3): qty 100

## 2021-04-14 MED ORDER — METHYLPREDNISOLONE SODIUM SUCC 40 MG IJ SOLR
40.0000 mg | Freq: Two times a day (BID) | INTRAMUSCULAR | Status: DC
  Administered 2021-04-15: 40 mg via INTRAVENOUS
  Filled 2021-04-14 (×2): qty 1

## 2021-04-14 MED ORDER — VENLAFAXINE HCL ER 75 MG PO CP24
150.0000 mg | ORAL_CAPSULE | Freq: Every day | ORAL | Status: DC
  Administered 2021-04-15 – 2021-04-17 (×3): 150 mg via ORAL
  Filled 2021-04-14 (×3): qty 2

## 2021-04-14 MED ORDER — ZOLPIDEM TARTRATE 5 MG PO TABS: 5.0000 mg | ORAL_TABLET | Freq: Every evening | ORAL | Status: DC | PRN

## 2021-04-14 MED ORDER — GABAPENTIN 400 MG PO CAPS
400.0000 mg | ORAL_CAPSULE | Freq: Three times a day (TID) | ORAL | Status: DC
Start: 2021-04-14 — End: 2021-04-17
  Administered 2021-04-14 – 2021-04-17 (×8): 400 mg via ORAL
  Filled 2021-04-14 (×8): qty 1

## 2021-04-14 MED ORDER — INSULIN ASPART 100 UNIT/ML IJ SOLN
0.0000 [IU] | Freq: Every day | INTRAMUSCULAR | Status: DC
  Administered 2021-04-14: 3 [IU] via SUBCUTANEOUS
  Filled 2021-04-14: qty 1

## 2021-04-14 NOTE — ED Provider Notes (Signed)
[facility] Emergency Department Provider Note  ____________________________________________   Event Date/Time   First MD Initiated Contact with Patient 04/14/21 1249     (approximate)  I have reviewed the triage vital signs and the nursing notes.   HISTORY  Chief Complaint No chief complaint on file.   HPI Suzanne Glenn is a 70 y.o. female who reports to the ER for evaluation of shortness of breath worsening over the last few days. She has known hx of lung cancer and COPD and reports recent hospitalization for pneumonia. She reports her husband is also being treated for pneumonia, but denies any other sick contacts. She endorses a productive cough, but cannot characterize the color of the sputum.  She denies any associated chest pain.  She has a nebulizer at home but does not have any refills of the medication but has been using her Trelegy as well as albuterol with little relief in symptoms.  She reports her home O2 reader yesterday was reading in the 60s.  She is on 2 L by nasal cannula at baseline and was on this during her low oxygenation.  She denies any fevers, abdominal pain, nausea vomiting diarrhea, dysuria, headache, dizziness.         Past Medical History:  Diagnosis Date   Anxiety    Asthma    Cancer ([place]) 12/2018   w/u for right upper lobe mass/cancer   COPD (chronic obstructive pulmonary disease) (HCC)    also, emphysema. now using o2 via np 24 hours a day   Coronary artery disease    Depression    Diabetes mellitus, type II (HCC)    GERD (gastroesophageal reflux disease)    Headache    migraines in early 20's   Heart murmur    Hypertension    Lung cancer ([place])    Neuropathy    Restless leg     Patient Active Problem List   Diagnosis Date Noted   COPD exacerbation ([place]) 04/14/2021   HLD (hyperlipidemia) 04/14/2021   Diabetes mellitus without complication ([place]) 66/03/3015   Depression 04/14/2021   CAD (coronary  artery disease) 04/14/2021   Iron deficiency anemia 04/14/2021   Sepsis ([place]) 04/14/2021   Acute on chronic respiratory failure with hypoxia ([place]) 03/20/2021   Atypical pneumonia 03/19/2021   MDD (major depressive disorder), recurrent, in full remission ([place]) 12/11/2019   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   Goals of care, counseling/discussion 07/26/2019   Major depressive disorder, recurrent, in partial remission ([place]) 03/27/2019   Chronic respiratory failure with hypoxia ([place]) 03/27/2019   Urge incontinence of urine 03/27/2019   Tobacco use 01/13/2019   Mass of upper lobe of left lung 01/13/2019   Symptomatic anemia 01/01/2019   Restless leg syndrome 06/30/2018   Pure hypercholesterolemia 09/26/2017   Sepsis due to pneumonia ([place]) 02/24/2017   Bilateral carotid artery stenosis 03/30/2016   Syncope and collapse 03/30/2016   Essential (primary) hypertension 08/29/2015   Controlled type 2 diabetes mellitus without complication ([place]) 10/23/3233   Combined fat and carbohydrate induced hyperlipemia 06/30/2015   Clinical depression 05/05/2015   GAD (generalized anxiety disorder) 05/05/2015   Depression, major, recurrent, moderate ([place]) 05/05/2015   Benign essential HTN 04/05/2015   Diabetes mellitus, type 2 ([place]) 02/09/2015   Type 2 diabetes mellitus ([place]) 02/09/2015   Cannot sleep 01/24/2015   Fibromyalgia 01/24/2015   CAFL (chronic airflow limitation) ([place]) 01/24/2015   H/O diabetes mellitus 01/24/2015   H/O hypercholesterolemia 01/24/2015  H/O: HTN (hypertension) 01/24/2015   Coronary artery disease 01/24/2015   Hypokalemia 01/24/2015   Arteriosclerosis of coronary artery 10/04/2014   Chest pain 10/04/2014   3-vessel CAD 08/02/2014   Acute chest pain 06/25/2014   SOB (shortness of breath) 06/25/2014   COPD, moderate ([place]) 06/08/2014   Moderate COPD (chronic obstructive pulmonary disease) ([place]) 06/08/2014   Gastro-esophageal reflux disease without  esophagitis 05/31/2014    Past Surgical History:  Procedure Laterality Date   BACK SURGERY  2005   surgery x 2, disc fused in neck, pinched nerve in center of back and neck   CARDIAC CATHETERIZATION  2015   1 stent placed for blockage   cervical bone infusion     CHOLECYSTECTOMY     THORACIC DISC SURGERY     TUBAL LIGATION     VIDEO BRONCHOSCOPY WITH ENDOBRONCHIAL ULTRASOUND N/A 03/06/2019   Procedure: VIDEO BRONCHOSCOPY WITH ENDOBRONCHIAL ULTRASOUND - DIABETIC;  Surgeon: Ottie Glazier, MD;  Location: [facility] ORS;  Service: Thoracic;  Laterality: N/A;    Prior to Admission medications   Medication Sig Start Date End Date Taking? Authorizing Provider  albuterol (VENTOLIN HFA) 108 (90 Base) MCG/ACT inhaler Inhale 2 puffs into the lungs every 4 (four) hours as needed for wheezing or shortness of breath.  03/22/14  Yes [provider]  amLODipine (NORVASC) 10 MG tablet Take 10 mg by mouth daily.    Yes [provider]  aspirin 81 MG chewable tablet Chew 1 tablet (81 mg total) by mouth daily. 03/23/21 04/22/21 Yes Max Sane, MD  carbidopa-levodopa (SINEMET IR) 25-100 MG tablet Take 1 tablet by mouth 3 (three) times daily.   Yes [provider]  cyanocobalamin 1000 MCG tablet Take 1 tablet by mouth daily.   Yes [provider]  esomeprazole (NEXIUM) 40 MG capsule Take 40 mg by mouth daily.   Yes [provider]  Eszopiclone (ESZOPICLONE) 3 MG TABS Take 1 tablet (3 mg total) by mouth at bedtime as needed. Take immediately before bedtime 02/20/21 02/20/22 Yes [place] Crane, MD  ferrous sulfate 325 (65 FE) MG tablet Take 1 tablet (325 mg total) by mouth 2 (two) times daily with a meal. 01/02/19  Yes Vaughan Basta, MD  Fluticasone-Umeclidin-Vilant 100-62.5-25 MCG/INH AEPB Inhale 1 puff into the lungs daily. [place] 11/28/18  Yes [provider]  gabapentin (NEURONTIN) 400 MG capsule Take 1 capsule (400 mg total) by mouth 3 (three) times daily.  03/23/21 04/22/21 Yes Max Sane, MD  metFORMIN (GLUCOPHAGE) 500 MG tablet Take 500 mg by mouth daily with breakfast.    Yes [provider]  metoprolol tartrate (LOPRESSOR) 25 MG tablet Take 25 mg by mouth 2 (two) times daily.    Yes [provider]  Multiple Vitamin (MULTIVITAMIN) tablet Take 1 tablet by mouth daily.   Yes [provider]  potassium chloride (KLOR-CON) 8 MEQ tablet Take 8 mEq by mouth daily.    Yes [provider]  pravastatin (PRAVACHOL) 80 MG tablet Take 80 mg by mouth every evening.    Yes [provider]  spironolactone (ALDACTONE) 25 MG tablet Take 25 mg by mouth daily.    Yes [provider]  venlafaxine XR (EFFEXOR-XR) 150 MG 24 hr capsule Take 1 capsule (150 mg total) by mouth daily with breakfast. 02/20/21  Yes Nevada Crane, MD  ACCU-CHEK FASTCLIX LANCETS [place]  02/18/15   [provider]  ACCU-CHEK SMARTVIEW test strip  02/18/15   [provider]  OXYGEN Inhale 2 L into  the lungs continuous.    [provider]  predniSONE (STERAPRED UNI-PAK 21 TAB) 10 MG (21) TBPK tablet Start 60 mg po daily, taper 10 mg daily until finish Patient not taking: Reported on 04/14/2021 03/23/21   Max Sane, MD    Allergies Diclofenac-misoprostol, Nsaids, Zolpidem, Hydrocodone-acetaminophen, and Simvastatin  Family History  Problem Relation Age of Onset   Anxiety disorder Mother    Cancer - Lung Mother    Depression Sister    Cancer Sister    Depression Brother    Cancer Sister    Cancer Sister    Cancer Sister    Heart Problems Sister    Bipolar disorder Sister    Diabetes Sister    Cancer - Lung Sister    Hypertension Sister    Diabetes Sister    Hyperlipidemia Sister    COPD Sister    Heart Problems Brother    Cancer Brother    Cancer Brother    Cancer Brother    Breast cancer Maternal Aunt     Social History Social History   Tobacco Use   Smoking status: Every Day    Packs/day: 1.00     Years: 40.00    Pack years: 40.00    Types: Cigarettes    Start date: 05/05/1975   Smokeless tobacco: Never  Vaping Use   Vaping Use: Never used  Substance Use Topics   Alcohol use: No    Alcohol/week: 0.0 standard drinks    Comment: socially   Drug use: No    Review of Systems Constitutional: No fever/chills Eyes: No visual changes. ENT: No sore throat. Cardiovascular: Denies chest pain. Respiratory: + shortness of breath. Gastrointestinal: No abdominal pain.  No nausea, no vomiting.  No diarrhea.  No constipation. Genitourinary: Negative for dysuria. Musculoskeletal: Negative for back pain. Skin: Negative for rash. Neurological: Negative for headaches, focal weakness or numbness.   ____________________________________________   PHYSICAL EXAM:  VITAL SIGNS: ED Triage Vitals  Enc Vitals Group     BP 04/14/21 1245 119/68     Pulse Rate 04/14/21 1245 98     Resp 04/14/21 1245 (!) 26     Temp 04/14/21 1245 99 F (37.2 C)     Temp Source 04/14/21 1245 Oral     SpO2 04/14/21 1245 (!) 85 %     Weight 04/14/21 1247 201 lb (91.2 kg)     Height 04/14/21 1247 5\' 3"  (1.6 m)     Head Circumference --      Peak Flow --      Pain Score 04/14/21 1247 0     Pain Loc --      Pain Edu? --      Excl. in [place]? --    Constitutional: Alert and oriented. In moderate respiratory distress. Eyes: Conjunctivae are normal. PERRL. EOMI. Head: Atraumatic. Nose: No congestion/rhinnorhea. Mouth/Throat: Mucous membranes are moist.   Neck: No stridor.   Cardiovascular: Normal rate, regular rhythm. Grossly normal heart sounds.  Good peripheral circulation. Respiratory: Moderate respiratory distress noted with pursed lip breathing and tripod positioning.  Satting at 85% on 4 L by nasal cannula. Diffuse wheezing and rhonchi appreciated. No crackles. Gastrointestinal: Soft and nontender. No distention. No abdominal bruits. No CVA tenderness. Musculoskeletal: No lower extremity tenderness nor  edema.  No joint effusions. Neurologic:  Normal speech and language. No gross focal neurologic deficits are appreciated. No gait instability. Skin:  Skin is warm, dry and intact. No rash noted. Psychiatric: Mood and affect are  normal. Speech and behavior are normal.  ____________________________________________   LABS (all labs ordered are listed, but only abnormal results are displayed)  Labs Reviewed  BLOOD GAS, VENOUS - Abnormal; Notable for the following components:      Result Value   pCO2, Ven 75 (*)    Bicarbonate 43.4 (*)    Acid-Base Excess 14.0 (*)    All other components within normal limits  CBC WITH DIFFERENTIAL/PLATELET - Abnormal; Notable for the following components:   Hemoglobin 11.9 (*)    Monocytes Absolute 1.2 (*)    Abs Immature Granulocytes 0.12 (*)    All other components within normal limits  COMPREHENSIVE METABOLIC PANEL - Abnormal; Notable for the following components:   Chloride 93 (*)    CO2 37 (*)    Glucose, Bld 163 (*)    All other components within normal limits  BRAIN NATRIURETIC PEPTIDE - Abnormal; Notable for the following components:   B Natriuretic Peptide 167.2 (*)    All other components within normal limits  RESP PANEL BY RT-PCR (FLU A&B, COVID) ARPGX2  CULTURE, BLOOD (ROUTINE X 2)  CULTURE, BLOOD (ROUTINE X 2)  EXPECTORATED SPUTUM ASSESSMENT W GRAM STAIN, RFLX TO RESP C  LACTIC ACID, PLASMA  LACTIC ACID, PLASMA  PROCALCITONIN  LEGIONELLA PNEUMOPHILA SEROGP 1 UR AG  STREP PNEUMONIAE URINARY ANTIGEN  CBC  TROPONIN I (HIGH SENSITIVITY)   ____________________________________________  EKG  Normal sinus rhythm with a rate of 95 bpm.  No ST elevation or depression. ____________________________________________  RADIOLOGY Lenoria Farrier, personally viewed and evaluated these images (plain radiographs) as part of my medical decision making, as well as reviewing the written report by the radiologist.  ED provided interpretation:  Patchy density noted in the left lung base, similar to previous x-ray  Official radiology report(s): DG Chest Portable 1 View  Result Date: 04/14/2021 CLINICAL DATA:  Shortness of breath EXAM: PORTABLE CHEST 1 VIEW COMPARISON:  03/23/2021 FINDINGS: Persistent in similar appearance of interstitial prominence and patchy density at the left greater than right lung bases. Possible trace left pleural effusion. Stable cardiomediastinal contours with normal heart size. IMPRESSION: Similar appearance to prior study. May reflect persistent atypical pneumonia. Electronically Signed   By: Macy Mis M.D.   On: 04/14/2021 13:36      ____________________________________________   INITIAL IMPRESSION / ASSESSMENT AND PLAN / ED COURSE  As part of my medical decision making, I reviewed the following data within [place] notes reviewed and incorporated, Labs reviewed, Radiograph reviewed, Discussed with admitting physician Dr. Blaine Hamper, Evaluated by EM attending Dr. Charna Archer, and Notes from prior ED visits        Patient is a 70 year old female who reports to the emergency department for evaluation of increasing shortness of breath with history of COPD and known lung cancer.  See HPI for further details.  On arrival, patient was noted to be in moderate respiratory distress with pursed lip breathing, tripod positioning and tachypnea.  She had diffuse wheezing and rhonchi appreciated on exam.  Though on stable baseline 2 L, she was only satting at 85% on 4 L by nasal cannula.  She did sat at around 93 to 95% on 6 L per nasal cannula, however she still remained tachypneic with increased work.  Thus, the decision was made to place her on BiPAP.  She also received 3 DuoNeb's, steroids for acute COPD exacerbation.  She reports significant improvement in her symptoms following DuoNeb's and steroid, however she  did have mild desats after attempting to remove her BiPAP.  Laboratory evaluation  shows grossly normal CBC with no leukocytosis.  CMP with mildly elevated CO2, mildly decreased chloride, elevated glucose at 163.  Troponin is within normal limits.  BNP elevated at 167.2.  Venipuncture reveals an elevated CO2 of 75, elevated bicarb and acid base excess of 14, however the pH is normal and this is likely partially the patient's chronic condition.  Respiratory panel negative for flu and COVID.  The patient recently left [facility] after treatment for COPD with pneumonia and was treated with azithromycin and Rocephin.  Patient was discharged to complete course of antibiotics and had improvement clinically, however she is had recent decline in the last week.  Repeat chest x-ray reveals persistence of the likely atypical infection worse in the left lower lobe.  Will initiate cefepime at as a throw at the recommendation of Dr. Charna Archer.  He has also personally evaluated the patient and agrees with the work-up thus far.  Case was discussed with Dr. Blaine Hamper, who agrees to admit the patient.  Patient is stable on BiPAP for transfer to the floor at this time.      ____________________________________________   FINAL CLINICAL IMPRESSION(S) / ED DIAGNOSES  Final diagnoses:  COPD exacerbation ([place])  Atypical pneumonia     ED Discharge Orders     None        Note:  This document was prepared using Dragon voice recognition software and may include unintentional dictation errors.    Marlana Salvage, PA 04/14/21 1709    Blake Divine, MD 04/15/21 1052

## 2021-04-14 NOTE — ED Notes (Signed)
Patient placed on bipap by RT at this time

## 2021-04-14 NOTE — Consult Note (Signed)
PHARMACY -  BRIEF ANTIBIOTIC NOTE   Pharmacy has received consult(s) for cefepime from an ED provider.  The patient's profile has been reviewed for ht/wt/allergies/indication/available labs.    One time order(s) placed for  Cefepime 2 gram   Further antibiotics/pharmacy consults should be ordered by admitting physician if indicated.                       Thank you, Dorothe Pea, PharmD, BCPS Clinical Pharmacist   04/14/2021  2:47 PM

## 2021-04-14 NOTE — ED Notes (Signed)
Pt sitting up eating meal tray at this time. Tolerating 6L [place] well.

## 2021-04-14 NOTE — H&P (Signed)
History and Physical    [name]:[id number] DOB: January 17, 1951 DOA: 04/14/2021  Referring MD/NP/PA:   PCP: Tracie Harrier, MD   Patient coming from:  The patient is coming from home.  At baseline, pt is independent for most of ADL.        Chief Complaint: SOB  HPI: Suzanne Glenn is a 70 y.o. female with medical history significant of hypertension, hyperlipidemia, diabetes mellitus, COPD on 2 L oxygen, asthma, GERD, depression, anxiety, RLS, lung cancer, CAD, carotid artery disease, tobacco abuse, iron deficiency anemia, who presents with shortness of breath.  Patient was recently hospitalized from 6/5-6/9 due to atypical anemia.  Patient states that in the past several days, she developed progressively worsening shortness of breath again.  She has cough with greenish colored sputum production.  Denies chest pain, fever or chills.  Patient was found to have oxygen desaturation to 60s% at home level 2 L oxygen.  Patient has severe respiratory distress initially and BiPAP was started.  Patient does not have nausea, vomiting, diarrhea or abdominal pain.  No symptoms of UTI.  ED Course: pt was found to have WBC 8.9, lactic acid 1.1, troponin level 11, negative COVID PCR, electrolytes renal function okay, temperature normal, blood pressure 123/71, heart rate 91, RR 31.  Chest x-ray showed patchy bilateral opacity (left side is worse than right).  ABG with pH 7.37, CO2 75, O2 43.  Patient is admitted to progressive bed as inpatient.  Review of Systems:   General: no fevers, chills, no body weight gain, has fatigue HEENT: no blurry vision, hearing changes or sore throat Respiratory: has dyspnea, coughing, wheezing CV: no chest pain, no palpitations GI: no nausea, vomiting, abdominal pain, diarrhea, constipation GU: no dysuria, burning on urination, increased urinary frequency, hematuria  Ext: no leg edema Neuro: no unilateral weakness, numbness, or tingling, no vision change or  hearing loss Skin: no rash, no skin tear. MSK: No muscle spasm, no deformity, no limitation of range of movement in spin Heme: No easy bruising.  Travel history: No recent long distant travel.  Allergy:  Allergies  Allergen Reactions   Diclofenac-Misoprostol Other (See Comments)    Arthrotec - gastritis    Nsaids Other (See Comments)    gastritis   Zolpidem Other (See Comments)    Sleep walking    Hydrocodone-Acetaminophen Nausea And Vomiting   Simvastatin Other (See Comments)    body aches    Past Medical History:  Diagnosis Date   Anxiety    Asthma    Cancer ([place]) 12/2018   w/u for right upper lobe mass/cancer   COPD (chronic obstructive pulmonary disease) ([place])    also, emphysema. now using o2 via np 24 hours a day   Coronary artery disease    Depression    Diabetes mellitus, type II (HCC)    GERD (gastroesophageal reflux disease)    Headache    migraines in early 20's   Heart murmur    Hypertension    Lung cancer ([place])    Neuropathy    Restless leg     Past Surgical History:  Procedure Laterality Date   BACK SURGERY  2005   surgery x 2, disc fused in neck, pinched nerve in center of back and neck   CARDIAC CATHETERIZATION  2015   1 stent placed for blockage   cervical bone infusion     CHOLECYSTECTOMY     THORACIC DISC SURGERY     TUBAL LIGATION  VIDEO BRONCHOSCOPY WITH ENDOBRONCHIAL ULTRASOUND N/A 03/06/2019   Procedure: VIDEO BRONCHOSCOPY WITH ENDOBRONCHIAL ULTRASOUND - DIABETIC;  Surgeon: Ottie Glazier, MD;  Location: [facility] ORS;  Service: Thoracic;  Laterality: N/A;    Social History:  reports that she has been smoking cigarettes. She started smoking about 45 years ago. She has a 40.00 pack-year smoking history. She has never used smokeless tobacco. She reports that she does not drink alcohol and does not use drugs.  Family History:  Family History  Problem Relation Age of Onset   Anxiety disorder Mother    Cancer - Lung Mother    Depression  Sister    Cancer Sister    Depression Brother    Cancer Sister    Cancer Sister    Cancer Sister    Heart Problems Sister    Bipolar disorder Sister    Diabetes Sister    Cancer - Lung Sister    Hypertension Sister    Diabetes Sister    Hyperlipidemia Sister    COPD Sister    Heart Problems Brother    Cancer Brother    Cancer Brother    Cancer Brother    Breast cancer Maternal Aunt      Prior to Admission medications   Medication Sig Start Date End Date Taking? Authorizing Provider  ACCU-CHEK FASTCLIX LANCETS Corn[place]  02/18/15   [provider]  ACCU-CHEK SMARTVIEW test strip  02/18/15   [provider]  albuterol (VENTOLIN HFA) 108 (90 Base) MCG/ACT inhaler Inhale 2 puffs into the lungs every 4 (four) hours as needed for wheezing or shortness of breath.  03/22/14   [provider]  amLODipine (NORVASC) 10 MG tablet Take 10 mg by mouth daily.     [provider]  aspirin 81 MG chewable tablet Chew 1 tablet (81 mg total) by mouth daily. 03/23/21 04/22/21  Max Sane, MD  carbidopa-levodopa (SINEMET IR) 25-100 MG tablet Take 1 tablet by mouth 3 (three) times daily.    [provider]  cyanocobalamin 1000 MCG tablet Take 1 tablet by mouth daily.    [provider]  esomeprazole (NEXIUM) 40 MG capsule Take 40 mg by mouth daily.    [provider]  Eszopiclone (ESZOPICLONE) 3 MG TABS Take 1 tablet (3 mg total) by mouth at bedtime as needed. Take immediately before bedtime 02/20/21 02/20/22  [place] Crane, MD  ferrous sulfate 325 (65 FE) MG tablet Take 1 tablet (325 mg total) by mouth 2 (two) times daily with a meal. 01/02/19   Vaughan Basta, MD  Fluticasone-Umeclidin-Vilant 100-62.5-25 MCG/INH AEPB Inhale 1 puff into the lungs daily. [place] 11/28/18   [provider]  gabapentin (NEURONTIN) 400 MG capsule Take 1 capsule (400 mg total) by mouth 3 (three) times daily. 03/23/21 04/22/21  Max Sane, MD  metFORMIN (GLUCOPHAGE) 500  MG tablet Take 500 mg by mouth daily with breakfast.     [provider]  metoprolol tartrate (LOPRESSOR) 25 MG tablet Take 25 mg by mouth 2 (two) times daily.     [provider]  Multiple Vitamin (MULTIVITAMIN) tablet Take 1 tablet by mouth daily.    [provider]  OXYGEN Inhale 2 L into the lungs continuous.    [provider]  potassium chloride (KLOR-CON) 8 MEQ tablet Take 8 mEq by mouth daily.     [provider]  pravastatin (PRAVACHOL) 80 MG tablet Take 80 mg by mouth every evening.     [provider]  predniSONE [place] Pare  UNI-PAK 21 TAB) 10 MG (21) TBPK tablet Start 60 mg po daily, taper 10 mg daily until finish 03/23/21   Max Sane, MD  spironolactone (ALDACTONE) 25 MG tablet Take 25 mg by mouth daily.     [provider]  venlafaxine XR (EFFEXOR-XR) 150 MG 24 hr capsule Take 1 capsule (150 mg total) by mouth daily with breakfast. 02/20/21   [place] Crane, MD    Physical Exam: Vitals:   04/14/21 1351 04/14/21 1400 04/14/21 1659 04/14/21 1700  BP:  123/71  (!) 145/74  Pulse: 91 91  (!) 108  Resp: (!) 27 (!) 31    Temp:    99 F (37.2 C)  TempSrc:    Oral  SpO2: 98% 99%  97%  Weight:   89 kg   Height:   5\' 3"  (1.6 m)    General: Not in acute distress HEENT:       Eyes: PERRL, EOMI, no scleral icterus.       ENT: No discharge from the ears and nose, no pharynx injection, no tonsillar enlargement.        Neck: No JVD, no bruit, no mass felt. Heme: No neck lymph node enlargement. Cardiac: S1/S2, RRR, No murmurs, No gallops or rubs. Respiratory: Has wheezing bilaterally. GI: Soft, nondistended, nontender, no rebound pain, no organomegaly, BS present. GU: No hematuria Ext: No pitting leg edema bilaterally. 1+DP/PT pulse bilaterally. Musculoskeletal: No joint deformities, No joint redness or warmth, no limitation of ROM in spin. Skin: No rashes.  Neuro: Alert, oriented X3, cranial nerves II-XII grossly intact,  moves all extremities normally.  Psych: Patient is not psychotic, no suicidal or hemocidal ideation.  Labs on Admission: I have personally reviewed following labs and imaging studies  CBC: Recent Labs  Lab 04/14/21 1300  WBC 8.9  NEUTROABS 6.6  HGB 11.9*  HCT 38.2  MCV 95.5  PLT 902   Basic Metabolic Panel: Recent Labs  Lab 04/14/21 1300  NA 138  K 4.8  CL 93*  CO2 37*  GLUCOSE 163*  BUN 13  CREATININE 0.79  CALCIUM 9.7   GFR: Estimated Creatinine Clearance: 71.2 mL/min (by C-G formula based on SCr of 0.79 mg/dL). Liver Function Tests: Recent Labs  Lab 04/14/21 1300  AST 20  ALT 9  ALKPHOS 79  BILITOT 0.4  PROT 7.9  ALBUMIN 3.5   No results for input(s): LIPASE, AMYLASE in the last 168 hours. No results for input(s): AMMONIA in the last 168 hours. Coagulation Profile: No results for input(s): INR, PROTIME in the last 168 hours. Cardiac Enzymes: No results for input(s): CKTOTAL, CKMB, CKMBINDEX, TROPONINI in the last 168 hours. BNP (last 3 results) No results for input(s): PROBNP in the last 8760 hours. HbA1C: No results for input(s): HGBA1C in the last 72 hours. CBG: Recent Labs  Lab 04/14/21 1718  GLUCAP 199*   Lipid Profile: No results for input(s): CHOL, HDL, LDLCALC, TRIG, CHOLHDL, LDLDIRECT in the last 72 hours. Thyroid Function Tests: No results for input(s): TSH, T4TOTAL, FREET4, T3FREE, THYROIDAB in the last 72 hours. Anemia Panel: No results for input(s): VITAMINB12, FOLATE, FERRITIN, TIBC, IRON, RETICCTPCT in the last 72 hours. Urine analysis:    Component Value Date/Time   COLORURINE YELLOW (A) 02/24/2017 2306   APPEARANCEUR HAZY (A) 02/24/2017 2306   LABSPEC 1.012 02/24/2017 2306   PHURINE 5.0 02/24/2017 2306   GLUCOSEU NEGATIVE 02/24/2017 2306   HGBUR SMALL (A) 02/24/2017 2306   BILIRUBINUR NEGATIVE 02/24/2017 2306   KETONESUR 20 (  A) 02/24/2017 2306   PROTEINUR 30 (A) 02/24/2017 2306   NITRITE NEGATIVE 02/24/2017 2306    LEUKOCYTESUR NEGATIVE 02/24/2017 2306   Sepsis Labs: @LABRCNTIP (procalcitonin:4,lacticidven:4) ) Recent Results (from the past 240 hour(s))  Resp Panel by RT-PCR (Flu A&B, Covid) Nasopharyngeal Swab     Status: None   Collection Time: 04/14/21  1:07 PM   Specimen: Nasopharyngeal Swab; Nasopharyngeal(NP) swabs in vial transport medium  Result Value Ref Range Status   SARS Coronavirus 2 by RT PCR NEGATIVE NEGATIVE Final    Comment: (NOTE) SARS-CoV-2 target nucleic acids are NOT DETECTED.  The SARS-CoV-2 RNA is generally detectable in upper respiratory specimens during the acute phase of infection. The lowest concentration of SARS-CoV-2 viral copies this assay can detect is 138 copies/mL. A negative result does not preclude SARS-Cov-2 infection and should not be used as the sole basis for treatment or other patient management decisions. A negative result may occur with  improper specimen collection/handling, submission of specimen other than nasopharyngeal swab, presence of viral mutation(s) within the areas targeted by this assay, and inadequate number of viral copies(<138 copies/mL). A negative result must be combined with clinical observations, patient history, and epidemiological information. The expected result is Negative.  Fact Sheet for Patients:  EntrepreneurPulse.com.au  Fact Sheet for Healthcare Providers:  IncredibleEmployment.be  This test is no t yet approved or cleared by the [place] FDA and  has been authorized for detection and/or diagnosis of SARS-CoV-2 by FDA under an Emergency Use Authorization (EUA). This EUA will remain  in effect (meaning this test can be used) for the duration of the COVID-19 declaration under Section 564(b)(1) of the Act, 21 U.S.C.section 360bbb-3(b)(1), unless the authorization is terminated  or revoked sooner.       Influenza A by PCR NEGATIVE NEGATIVE Final   Influenza B by PCR NEGATIVE  NEGATIVE Final    Comment: (NOTE) The Xpert Xpress SARS-CoV-2/FLU/RSV plus assay is intended as an aid in the diagnosis of influenza from Nasopharyngeal swab specimens and should not be used as a sole basis for treatment. Nasal washings and aspirates are unacceptable for Xpert Xpress SARS-CoV-2/FLU/RSV testing.  Fact Sheet for Patients: EntrepreneurPulse.com.au  Fact Sheet for Healthcare Providers: IncredibleEmployment.be  This test is not yet approved or cleared by the [place] FDA and has been authorized for detection and/or diagnosis of SARS-CoV-2 by FDA under an Emergency Use Authorization (EUA). This EUA will remain in effect (meaning this test can be used) for the duration of the COVID-19 declaration under Section 564(b)(1) of the Act, 21 U.S.C. section 360bbb-3(b)(1), unless the authorization is terminated or revoked.  Performed at [facility], [address] [place], [place] [address]      Radiological Exams on Admission: DG Chest Portable 1 View  Result Date: 04/14/2021 CLINICAL DATA:  Shortness of breath EXAM: PORTABLE CHEST 1 VIEW COMPARISON:  03/23/2021 FINDINGS: Persistent in similar appearance of interstitial prominence and patchy density at the left greater than right lung bases. Possible trace left pleural effusion. Stable cardiomediastinal contours with normal heart size. IMPRESSION: Similar appearance to prior study. May reflect persistent atypical pneumonia. Electronically Signed   By: Macy Mis M.D.   On: 04/14/2021 13:36     EKG: I have personally reviewed.  Sinus rhythm, QTC 407, T wave inversion in lead I/aVL.  Assessment/Plan Principal Problem:   Acute on chronic respiratory failure with hypoxia (HCC) Active Problems:   Coronary artery disease   Benign essential HTN   Gastro-esophageal reflux disease without  esophagitis   Tobacco use   COPD exacerbation (HCC)   Diabetes mellitus without  complication (HCC)   Depression   CAD (coronary artery disease)   Iron deficiency anemia   Sepsis (HCC)   RLS (restless legs syndrome)   Acute on chronic respiratory failure with hypoxia and sepsis due to COPD exacerbation: Patient has shortness of breath, productive cough and no wheezing on auscultation, clinically consistent with COPD exacerbation.  Patient had atypical pneumonia recently, chest x-ray still has patchy bilateral infiltration, cannot completely rule out pneumonia.  Patient meets criteria for sepsis with tachycardia with heart rate in 91 and tachypnea with RR 31.  No fever or leukocytosis.  Lactic acid is normal.  Currently hemodynamically stable. Pt is off BiPAP in afternoon.  -Admited to progressive unit as inpatient - Cefepime, and doxycycline - Solu-Medrol 40 mg twice daily - Mucinex for cough  - Bronchodilators - Urine legionella and S. pneumococcal antigen - Follow up blood culture x2, sputum culture - Incentive spirometry - will get Procalcitonin - IVF: 500 mL of NS bolus   Coronary artery disease -Aspirin  Benign essential HTN -IV hydralazine as needed -Continue home metoprolol, amlodipine, spironolactone  Gastro-esophageal reflux disease without esophagitis -Protonix  Tobacco use -Nicotine patch -Did counseling about importance of quitting smoking  Diabetes mellitus without complication ([place]): Recent A1c 6.4, well controlled.  Patient is taking metformin at home -Sliding scale insulin  Iron deficiency anemia: Hemoglobin stable, 11.9 -Continue iron supplement  RLS (restless legs syndrome) -Sinemet         DVT ppx: SQ Lovenox Code Status: Full code Family Communication:  Yes, patient's  husband at bed side Disposition Plan:  Anticipate discharge back to previous environment Consults called:  none Admission status and Level of care: Progressive Cardiac:    as inpt      Status is: Inpatient  Remains inpatient appropriate  because:Inpatient level of care appropriate due to severity of illness  Dispo: The patient is from: Home              Anticipated d/c is to: Home              Patient currently is not medically stable to d/c.   Difficult to place patient No         Date of Service 04/14/2021    [facility]   If 7PM-7AM, please contact night-coverage [email or web address] 04/14/2021, 6:56 PM

## 2021-04-14 NOTE — ED Triage Notes (Signed)
Pt reports that she has been Short of Breath for the last several days. She reports at home her O2 has been in the 60's. She is tachapenic, speaking is able to speak in complete sentences and is joking around she is 2L [place] chronically

## 2021-04-14 NOTE — Consult Note (Signed)
Pharmacy Antibiotic Note  Suzanne Glenn is a 70 y.o. female admitted on 04/14/2021 with pneumonia.  Pharmacy has been consulted for cefepime dosing.  Plan: Cefepime 2g q8h  Monitor clinical picture and renal function F/U C&S, abx deescalation / LOT   Height: 5\' 3"  (160 cm) Weight: 91.2 kg (201 lb) IBW/kg (Calculated) : 52.4  Temp (24hrs), Avg:99 F (37.2 C), Min:99 F (37.2 C), Max:99 F (37.2 C)  Recent Labs  Lab 04/14/21 1300  WBC 8.9  CREATININE 0.79    Estimated Creatinine Clearance: 72.1 mL/min (by C-G formula based on SCr of 0.79 mg/dL).    Allergies  Allergen Reactions   Diclofenac-Misoprostol Other (See Comments)    Arthrotec - gastritis    Nsaids Other (See Comments)    gastritis   Zolpidem Other (See Comments)    Sleep walking    Hydrocodone-Acetaminophen Nausea And Vomiting   Simvastatin Other (See Comments)    body aches    Antimicrobials this admission: 7/1 cefepime >>  Dose adjustments this admission: N/A  Microbiology results: 7/1 BCx: pending   Thank you for allowing pharmacy to be a part of this patient's care.  Darnelle Bos, PharmD 04/14/2021 3:00 PM

## 2021-04-14 NOTE — Progress Notes (Signed)
Critical lactic called, 2.7.  Notified Dr. Sidney Ace, order received. Will continue to monitor.

## 2021-04-14 NOTE — ED Notes (Signed)
Pt placed on 6L via [place]. PA Casey at bedside.

## 2021-04-14 NOTE — Progress Notes (Signed)
PHARMACIST - PHYSICIAN COMMUNICATION  CONCERNING:  Enoxaparin (Lovenox) for DVT Prophylaxis   DESCRIPTION: Patient was prescribed enoxaprin 40mg  q24 hours for VTE prophylaxis.   Filed Weights   04/14/21 1247  Weight: 91.2 kg (201 lb)    Body mass index is 35.61 kg/m.  Estimated Creatinine Clearance: 72.1 mL/min (by C-G formula based on SCr of 0.79 mg/dL).   Based on [place] patient is candidate for enoxaparin 0.5mg /kg TBW SQ every 24 hours based on BMI being >30.  RECOMMENDATION: Pharmacy has adjusted enoxaparin dose per [facility] policy.  Patient is now receiving enoxaparin 45 mg every 24 hours    Darnelle Bos, PharmD Clinical Pharmacist  04/14/2021 4:43 PM

## 2021-04-15 LAB — CBC
HCT: 36.3 % (ref 36.0–46.0)
Hemoglobin: 11.2 g/dL — ABNORMAL LOW (ref 12.0–15.0)
MCH: 29.1 pg (ref 26.0–34.0)
MCHC: 30.9 g/dL (ref 30.0–36.0)
MCV: 94.3 fL (ref 80.0–100.0)
Platelets: 296 10*3/uL (ref 150–400)
RBC: 3.85 MIL/uL — ABNORMAL LOW (ref 3.87–5.11)
RDW: 15.4 % (ref 11.5–15.5)
WBC: 10.3 10*3/uL (ref 4.0–10.5)
nRBC: 0.2 % (ref 0.0–0.2)

## 2021-04-15 LAB — GLUCOSE, CAPILLARY
Glucose-Capillary: 181 mg/dL — ABNORMAL HIGH (ref 70–99)
Glucose-Capillary: 324 mg/dL — ABNORMAL HIGH (ref 70–99)
Glucose-Capillary: 81 mg/dL (ref 70–99)
Glucose-Capillary: 159 mg/dL — ABNORMAL HIGH (ref 70–99)

## 2021-04-15 LAB — LACTIC ACID, PLASMA: Lactic Acid, Venous: 1.5 mmol/L (ref 0.5–1.9)

## 2021-04-15 LAB — STREP PNEUMONIAE URINARY ANTIGEN: Strep Pneumo Urinary Antigen: NEGATIVE

## 2021-04-15 MED ORDER — PREDNISONE 20 MG PO TABS
40.0000 mg | ORAL_TABLET | Freq: Every day | ORAL | Status: DC
  Administered 2021-04-16 – 2021-04-17 (×2): 40 mg via ORAL
  Filled 2021-04-15 (×2): qty 2

## 2021-04-15 MED ORDER — DOXYCYCLINE HYCLATE 100 MG PO TABS
100.0000 mg | ORAL_TABLET | Freq: Two times a day (BID) | ORAL | Status: DC
  Administered 2021-04-16 – 2021-04-17 (×3): 100 mg via ORAL
  Filled 2021-04-15 (×3): qty 1

## 2021-04-15 MED ORDER — MELATONIN 5 MG PO TABS
5.0000 mg | ORAL_TABLET | Freq: Every evening | ORAL | Status: DC | PRN
  Administered 2021-04-15: 5 mg via ORAL
  Filled 2021-04-15: qty 1

## 2021-04-15 NOTE — Progress Notes (Signed)
PROGRESS NOTE    Suzanne Glenn  DJM:[id number] DOB: 05-04-1951 DOA: 04/14/2021 PCP: Tracie Harrier, MD   Chief complaint.  Shortness of breath. Brief Narrative:  Suzanne Glenn is a 70 y.o. female with medical history significant of hypertension, hyperlipidemia, diabetes mellitus, COPD on 2 L oxygen, asthma, GERD, depression, anxiety, RLS, lung cancer, CAD, carotid artery disease, tobacco abuse, iron deficiency anemia, who presents with shortness of breath. Patient is diagnosed with COPD exacerbation, was started on IV steroids.  Assessment & Plan:   Principal Problem:   Acute on chronic respiratory failure with hypoxia (HCC) Active Problems:   Coronary artery disease   Benign essential HTN   Gastro-esophageal reflux disease without esophagitis   Tobacco use   COPD exacerbation (HCC)   Diabetes mellitus without complication (HCC)   Depression   CAD (coronary artery disease)   Iron deficiency anemia   Sepsis (HCC)   RLS (restless legs syndrome)  #1.  Acute on chronic hypoxemic respiratory failure. COPD exacerbation. Patient procalcitonin level 0.1, no bacterial infection.  Will discontinue cefepime. Patient has significant bronchospasm secondary to COPD exacerbation, patient currently IV steroids, developed significant hyperglycemia, I will change to oral prednisone. Since patient does not have a bacterial infection, sepsis is ruled out. I will continue wean oxygen, patient's is on 2 L oxygen at baseline, currently on 4 L.  She probably can be discharged home tomorrow if we can wean down oxygen to 2 L.  #2.  Essential hypertension. Type 2 diabetes. Iron deficient anemia. She developed significant hyperglycemia due to steroids.  Continue sliding scale insulin.  Change steroids to oral.   DVT prophylaxis: Lovenox Code Status: full Family Communication:  Disposition Plan:    Status is: Inpatient  Remains inpatient appropriate because:Inpatient level of care  appropriate due to severity of illness  Dispo: The patient is from: Home              Anticipated d/c is to: Home              Patient currently is not medically stable to d/c.   Difficult to place patient No        I/O last 3 completed shifts: In: 1120 [P.O.:420; IV Piggyback:700] Out: 150 [Urine:150] Total I/O In: 240 [P.O.:240] Out: 150 [Urine:150]     Consultants:  None  Procedures: None  Antimicrobials: Doxycycline.  Subjective: Patient still on 4 L oxygen, but feels better.  She still has short of breath and wheezing. No fever chills  No dysuria hematuria Abdominal pain nausea vomiting. Orthostatic dizziness. No chest pain or palpitation.  Objective: Vitals:   04/15/21 0418 04/15/21 0741 04/15/21 0752 04/15/21 1137  BP: 114/63 123/70  (!) 151/73  Pulse: 78 94  95  Resp: 20 18  17   Temp: 98.3 F (36.8 C) 98.2 F (36.8 C)  98.6 F (37 C)  TempSrc: Oral Oral    SpO2: 95% 95% 94% 93%  Weight:      Height:        Intake/Output Summary (Last 24 hours) at 04/15/2021 1210 Last data filed at 04/15/2021 1050 Gross per 24 hour  Intake 1360 ml  Output 300 ml  Net 1060 ml   Filed Weights   04/14/21 1247 04/14/21 1659  Weight: 91.2 kg 89 kg    Examination:  General exam: Appears calm and comfortable  Respiratory system: Decreased breathing sounds with wheezing.  Respiratory effort normal. Cardiovascular system: S1 & S2 heard, RRR. No JVD, murmurs, rubs, gallops or  clicks. No pedal edema. Gastrointestinal system: Abdomen is nondistended, soft and nontender. No organomegaly or masses felt. Normal bowel sounds heard. Central nervous system: Alert and oriented. No focal neurological deficits. Extremities: Symmetric 5 x 5 power. Skin: No rashes, lesions or ulcers Psychiatry: Judgement and insight appear normal. Mood & affect appropriate.     Data Reviewed: I have personally reviewed following labs and imaging studies  CBC: Recent Labs  Lab  04/14/21 1300 04/15/21 0618  WBC 8.9 10.3  NEUTROABS 6.6  --   HGB 11.9* 11.2*  HCT 38.2 36.3  MCV 95.5 94.3  PLT 298 627   Basic Metabolic Panel: Recent Labs  Lab 04/14/21 1300  NA 138  K 4.8  CL 93*  CO2 37*  GLUCOSE 163*  BUN 13  CREATININE 0.79  CALCIUM 9.7   GFR: Estimated Creatinine Clearance: 71.2 mL/min (by C-G formula based on SCr of 0.79 mg/dL). Liver Function Tests: Recent Labs  Lab 04/14/21 1300  AST 20  ALT 9  ALKPHOS 79  BILITOT 0.4  PROT 7.9  ALBUMIN 3.5   No results for input(s): LIPASE, AMYLASE in the last 168 hours. No results for input(s): AMMONIA in the last 168 hours. Coagulation Profile: No results for input(s): INR, PROTIME in the last 168 hours. Cardiac Enzymes: No results for input(s): CKTOTAL, CKMB, CKMBINDEX, TROPONINI in the last 168 hours. BNP (last 3 results) No results for input(s): PROBNP in the last 8760 hours. HbA1C: No results for input(s): HGBA1C in the last 72 hours. CBG: Recent Labs  Lab 04/14/21 1718 04/14/21 2247 04/15/21 0740 04/15/21 1135  GLUCAP 199* 299* 181* 324*   Lipid Profile: No results for input(s): CHOL, HDL, LDLCALC, TRIG, CHOLHDL, LDLDIRECT in the last 72 hours. Thyroid Function Tests: No results for input(s): TSH, T4TOTAL, FREET4, T3FREE, THYROIDAB in the last 72 hours. Anemia Panel: No results for input(s): VITAMINB12, FOLATE, FERRITIN, TIBC, IRON, RETICCTPCT in the last 72 hours. Sepsis Labs: Recent Labs  Lab 04/14/21 1618 04/14/21 1932 04/15/21 0008  PROCALCITON 0.10  --   --   LATICACIDVEN 1.1 2.7* 1.5    Recent Results (from the past 240 hour(s))  Resp Panel by RT-PCR (Flu A&B, Covid) Nasopharyngeal Swab     Status: None   Collection Time: 04/14/21  1:07 PM   Specimen: Nasopharyngeal Swab; Nasopharyngeal(NP) swabs in vial transport medium  Result Value Ref Range Status   SARS Coronavirus 2 by RT PCR NEGATIVE NEGATIVE Final    Comment: (NOTE) SARS-CoV-2 target nucleic acids are NOT  DETECTED.  The SARS-CoV-2 RNA is generally detectable in upper respiratory specimens during the acute phase of infection. The lowest concentration of SARS-CoV-2 viral copies this assay can detect is 138 copies/mL. A negative result does not preclude SARS-Cov-2 infection and should not be used as the sole basis for treatment or other patient management decisions. A negative result may occur with  improper specimen collection/handling, submission of specimen other than nasopharyngeal swab, presence of viral mutation(s) within the areas targeted by this assay, and inadequate number of viral copies(<138 copies/mL). A negative result must be combined with clinical observations, patient history, and epidemiological information. The expected result is Negative.  Fact Sheet for Patients:  EntrepreneurPulse.com.au  Fact Sheet for Healthcare Providers:  IncredibleEmployment.be  This test is no t yet approved or cleared by the [place] FDA and  has been authorized for detection and/or diagnosis of SARS-CoV-2 by FDA under an Emergency Use Authorization (EUA). This EUA will remain  in effect (meaning this  test can be used) for the duration of the COVID-19 declaration under Section 564(b)(1) of the Act, 21 U.S.C.section 360bbb-3(b)(1), unless the authorization is terminated  or revoked sooner.       Influenza A by PCR NEGATIVE NEGATIVE Final   Influenza B by PCR NEGATIVE NEGATIVE Final    Comment: (NOTE) The Xpert Xpress SARS-CoV-2/FLU/RSV plus assay is intended as an aid in the diagnosis of influenza from Nasopharyngeal swab specimens and should not be used as a sole basis for treatment. Nasal washings and aspirates are unacceptable for Xpert Xpress SARS-CoV-2/FLU/RSV testing.  Fact Sheet for Patients: EntrepreneurPulse.com.au  Fact Sheet for Healthcare Providers: IncredibleEmployment.be  This test is not yet  approved or cleared by the [place] FDA and has been authorized for detection and/or diagnosis of SARS-CoV-2 by FDA under an Emergency Use Authorization (EUA). This EUA will remain in effect (meaning this test can be used) for the duration of the COVID-19 declaration under Section 564(b)(1) of the Act, 21 U.S.C. section 360bbb-3(b)(1), unless the authorization is terminated or revoked.  Performed at [facility], [address] [place], [place] [address]   CULTURE, BLOOD (ROUTINE X 2) w Reflex to ID Panel     Status: None (Preliminary result)   Collection Time: 04/14/21  4:17 PM   Specimen: BLOOD  Result Value Ref Range Status   Specimen Description BLOOD BLOOD LEFT FOREARM  Final   Special Requests   Final    BOTTLES DRAWN AEROBIC AND ANAEROBIC Blood Culture adequate volume   Culture   Final    NO GROWTH < 24 HOURS Performed at [facility], [address], [place], [place] [address]    Report Status PENDING  Incomplete  CULTURE, BLOOD (ROUTINE X 2) w Reflex to ID Panel     Status: None (Preliminary result)   Collection Time: 04/14/21  4:18 PM   Specimen: BLOOD  Result Value Ref Range Status   Specimen Description BLOOD BLOOD LEFT HAND  Final   Special Requests   Final    BOTTLES DRAWN AEROBIC AND ANAEROBIC Blood Culture adequate volume   Culture   Final    NO GROWTH < 24 HOURS Performed at [facility], [address], [place],  [address]    Report Status PENDING  Incomplete         Radiology Studies: DG Chest Portable 1 View  Result Date: 04/14/2021 CLINICAL DATA:  Shortness of breath EXAM: PORTABLE CHEST 1 VIEW COMPARISON:  03/23/2021 FINDINGS: Persistent in similar appearance of interstitial prominence and patchy density at the left greater than right lung bases. Possible trace left pleural effusion. Stable cardiomediastinal contours with normal heart size. IMPRESSION: Similar appearance to prior study. May reflect persistent  atypical pneumonia. Electronically Signed   By: Macy Mis M.D.   On: 04/14/2021 13:36        Scheduled Meds:  amLODipine  10 mg Oral Daily   aspirin  81 mg Oral Daily   carbidopa-levodopa  1 tablet Oral TID   enoxaparin (LOVENOX) injection  0.5 mg/kg Subcutaneous Q24H   ferrous sulfate  325 mg Oral BID WC   gabapentin  400 mg Oral TID   insulin aspart  0-5 Units Subcutaneous QHS   insulin aspart  0-9 Units Subcutaneous TID WC   ipratropium-albuterol  3 mL Nebulization Q4H   methylPREDNISolone (SOLU-MEDROL) injection  40 mg Intravenous Q12H   metoprolol tartrate  25 mg Oral BID   multivitamin with minerals  1 tablet Oral Daily  nicotine  21 mg Transdermal Daily   pantoprazole  40 mg Oral Daily   pravastatin  80 mg Oral QPM   spironolactone  25 mg Oral Daily   venlafaxine XR  150 mg Oral Q breakfast   cyanocobalamin  1,000 mcg Oral Daily   Continuous Infusions:  doxycycline (VIBRAMYCIN) IV 100 mg (04/15/21 0457)     LOS: 1 day    Time spent: 28 minutes    Sharen Hones, MD [facility]   To contact the attending provider between 7A-7P or the covering provider during after hours 7P-7A, please log into the web site [email or web address] and access using universal [place] password for that web site. If you do not have the password, please call the hospital operator.  04/15/2021, 12:10 PM

## 2021-04-15 NOTE — Progress Notes (Signed)
PHARMACIST - PHYSICIAN COMMUNICATION  CONCERNING: Antibiotic IV to Oral Route Change Policy  RECOMMENDATION: This patient is receiving doxycycline by the intravenous route.  Based on criteria approved by the Pharmacy and Therapeutics Committee, the antibiotic(s) is/are being converted to the equivalent oral dose form(s).   DESCRIPTION: These criteria include: Patient being treated for a respiratory tract infection, urinary tract infection, cellulitis or clostridium difficile associated diarrhea if on metronidazole The patient is not neutropenic and does not exhibit a GI malabsorption state The patient is eating (either orally or via tube) and/or has been taking other orally administered medications for a least 24 hours The patient is improving clinically and has a Tmax < 100.5  If you have questions about this conversion, please contact the [place]  04/15/21

## 2021-04-16 LAB — COMPREHENSIVE METABOLIC PANEL
ALT: 5 U/L (ref 0–44)
AST: 16 U/L (ref 15–41)
Albumin: 2.9 g/dL — ABNORMAL LOW (ref 3.5–5.0)
Alkaline Phosphatase: 64 U/L (ref 38–126)
Anion gap: 8 (ref 5–15)
BUN: 19 mg/dL (ref 8–23)
CO2: 36 mmol/L — ABNORMAL HIGH (ref 22–32)
Calcium: 9.1 mg/dL (ref 8.9–10.3)
Chloride: 95 mmol/L — ABNORMAL LOW (ref 98–111)
Creatinine, Ser: 0.77 mg/dL (ref 0.44–1.00)
GFR, Estimated: >60 mL/min (ref >60)
Glucose, Bld: 165 mg/dL — ABNORMAL HIGH (ref 70–99)
Potassium: 4.6 mmol/L (ref 3.5–5.1)
Sodium: 139 mmol/L (ref 135–145)
Total Bilirubin: 0.5 mg/dL (ref 0.3–1.2)
Total Protein: 6.5 g/dL (ref 6.5–8.1)

## 2021-04-16 LAB — MAGNESIUM: Magnesium: 1.7 mg/dL (ref 1.7–2.4)

## 2021-04-16 LAB — GLUCOSE, CAPILLARY
Glucose-Capillary: 165 mg/dL — ABNORMAL HIGH (ref 70–99)
Glucose-Capillary: 233 mg/dL — ABNORMAL HIGH (ref 70–99)
Glucose-Capillary: 201 mg/dL — ABNORMAL HIGH (ref 70–99)
Glucose-Capillary: 194 mg/dL — ABNORMAL HIGH (ref 70–99)

## 2021-04-16 MED ORDER — TRAZODONE HCL 50 MG PO TABS
50.0000 mg | ORAL_TABLET | Freq: Every day | ORAL | Status: DC
  Administered 2021-04-16: 50 mg via ORAL
  Filled 2021-04-16: qty 1

## 2021-04-16 MED ORDER — MAGNESIUM SULFATE 2 GM/50ML IV SOLN
2.0000 g | Freq: Once | INTRAVENOUS | Status: AC
  Administered 2021-04-16: 2 g via INTRAVENOUS
  Filled 2021-04-16: qty 50

## 2021-04-16 NOTE — Evaluation (Signed)
Physical Therapy Evaluation Patient Details Name: Suzanne Glenn MRN: [id number] DOB: [birth year] Today's Date: 04/16/2021   History of Present Illness  Pt is a 70 y/o F admitted on 04/14/21 with c/c of SOB. Pt is being treated for acute on chronic respiratory failure 2/2 COPD exacerbation. PMH: HTN, HLD, DM, COPD on 2L O2, asthma, GERD, depression, anxiety, RLS, lung CA, CAD, tobacco abuse, iron deficiency anemia  Clinical Impression  Pt seen for PT evaluation with pt very pleasant & engaging. Pt is able to ambulate 1 lap around nurses station without [place] negotiate 7 steps with 1 rail & supervision. Pt is on 4L/min via nasal cannula throughout session with lowest SpO2 of 81% after gait once sitting in recliner but SPO2 increases to 89-90% with rest & cuing for pursed lip breathing. Encouraged pt to utilize incentive spirometer 10x/hour. Will continue to see pt in acute setting to focus on endurance and negotiating full flight of stairs, which pt has to access her home.   HR during session 98-120 bpm    Follow Up Recommendations Supervision - Intermittent (pulmonary rehab)    Equipment Recommendations  None recommended by PT    Recommendations for Other Services       Precautions / Restrictions Precautions Precautions: None Restrictions Weight Bearing Restrictions: No      Mobility  Bed Mobility Overal bed mobility: Modified Independent                  Transfers Overall transfer level: Independent                  Ambulation/Gait Ambulation/Gait assistance: Modified independent (Device/Increase time) Gait Distance (Feet): 175 Feet Assistive device: None Gait Pattern/deviations: WFL(Within Functional Limits)        Stairs Stairs: Yes Stairs assistance: Supervision Stair Management: One rail Right Number of Stairs: 7    Wheelchair Mobility    Modified Rankin (Stroke Patients Only)       Balance Overall balance assessment: Needs  assistance Sitting-balance support: No upper extremity supported Sitting balance-Leahy Scale: Normal       Standing balance-Leahy Scale: Good                               Pertinent Vitals/Pain Pain Assessment: No/denies pain    Home Living Family/patient expects to be discharged to:: Private residence Living Arrangements: Spouse/significant other Available Help at Discharge: Family;Available 24 hours/day Type of Home: Mobile home Home Access: Stairs to enter Entrance Stairs-Rails: Right;Left;Can reach both Entrance Stairs-Number of Steps: 20 Home Layout: One level        Prior Function Level of Independence: Independent         Comments: Independent without AD, denies falls in the past 6 months, does not drive anymore.     Hand Dominance        Extremity/Trunk Assessment   Upper Extremity Assessment Upper Extremity Assessment: Overall WFL for tasks assessed    Lower Extremity Assessment Lower Extremity Assessment: Generalized weakness       Communication   Communication: No difficulties  Cognition Arousal/Alertness: Awake/alert Behavior During Therapy: WFL for tasks assessed/performed Overall Cognitive Status: Within Functional Limits for tasks assessed                                 General Comments: Very pleasant, engaging lady  General Comments      Exercises     Assessment/Plan    PT Assessment Patient needs continued PT services  PT Problem List Decreased strength;Decreased mobility;Decreased activity tolerance;Decreased balance;Cardiopulmonary status limiting activity       PT Treatment Interventions DME instruction;Therapeutic exercise;Gait training;Balance training;Neuromuscular re-education;Stair training;Functional mobility training;Therapeutic activities;Patient/family education    PT Goals (Current goals can be found in the Care Plan section)  Acute Rehab PT Goals Patient Stated Goal: none  stated PT Goal Formulation: With patient Time For Goal Achievement: 04/30/21 Potential to Achieve Goals: Good Additional Goals Additional Goal #1: Pt will ambulate 300 ft with LRAD & mod I to increase endurance & activity tolerance.    Frequency Min 2X/week   Barriers to discharge Inaccessible home environment flight of stairs to access home    Co-evaluation               AM-PAC PT "6 Clicks" Mobility  Outcome Measure Help needed turning from your back to your side while in a flat bed without using bedrails?: None Help needed moving from lying on your back to sitting on the side of a flat bed without using bedrails?: None Help needed moving to and from a bed to a chair (including a wheelchair)?: None Help needed standing up from a chair using your arms (e.g., wheelchair or bedside chair)?: None Help needed to walk in hospital room?: None Help needed climbing 3-5 steps with a railing? : A Little 6 Click Score: 23    End of Session Equipment Utilized During Treatment: Oxygen Activity Tolerance: Patient tolerated treatment well Patient left: in chair;with chair alarm set;with call bell/phone within reach   PT Visit Diagnosis: Muscle weakness (generalized) (M62.81);Unsteadiness on feet (R26.81)    Time: 9702-6378 PT Time Calculation (min) (ACUTE ONLY): 18 min   Charges:   PT Evaluation $PT Eval Low Complexity: [place], PT, DPT 04/16/21, 1:56 PM   Waunita Schooner 04/16/2021, 1:55 PM

## 2021-04-16 NOTE — Progress Notes (Signed)
PROGRESS NOTE    Suzanne Glenn  OEU:[id number] DOB: Aug 23, 1951 DOA: 04/14/2021 PCP: Tracie Harrier, MD   Chief complaint for shortness of breath. Brief Narrative:  Suzanne Glenn is a 70 y.o. female with medical history significant of hypertension, hyperlipidemia, diabetes mellitus, COPD on 2 L oxygen, asthma, GERD, depression, anxiety, RLS, lung cancer, CAD, carotid artery disease, tobacco abuse, iron deficiency anemia, who presents with shortness of breath. Patient is diagnosed with COPD exacerbation, was started on IV steroids, changed to oral on 7/2 due to severe hyperglycemia.   Assessment & Plan:   Principal Problem:   Acute on chronic respiratory failure with hypoxia (HCC) Active Problems:   Coronary artery disease   Benign essential HTN   Gastro-esophageal reflux disease without esophagitis   Tobacco use   COPD exacerbation (HCC)   Diabetes mellitus without complication (HCC)   Depression   CAD (coronary artery disease)   Iron deficiency anemia   RLS (restless legs syndrome)  #1.  Acute on chronic hypoxemic respiratory failure. COPD exacerbation. Patient feels better, she has less wheezing.  However, she is still on 4 L oxygen.  Not ready to discharge.  We will continue oral steroids.  Continue scheduled albuterol treatment.  Add incentive spirometer.  Increase activity.  #2.  Essential hypertension. Continue home medicines.  3.  Uncontrolled type 2 diabetes with hyperglycemia Glucose running high yesterday due to IV steroids, glucose better this morning.  4.  Iron deficient anemia. Hemoglobin stable.  DVT prophylaxis: Lovenox Code Status: full Family Communication:  Disposition Plan:    Status is: Inpatient  Remains inpatient appropriate because:Inpatient level of care appropriate due to severity of illness  Dispo: The patient is from: Home              Anticipated d/c is to: Home              Patient currently is not medically stable to d/c.    Difficult to place patient No        I/O last 3 completed shifts: In: 59 [P.O.:1380; IV Piggyback:100] Out: 1100 [Urine:1100] Total I/O In: 600 [P.O.:360; Other:240] Out: -      Consultants:  None  Procedures: None  Antimicrobials: Doxycycline  Subjective: Patient is still on 4 L oxygen today, she feels better with short of breath.  She is less wheezing.  However, she still have a cough, with white sometimes mixed with green mucus. No fever or chills. No abdominal pain or nausea vomiting No dysuria hematuria.  She had a bowel movement yesterday.  She is complaining some insomnia.  Objective: Vitals:   04/16/21 0200 04/16/21 0501 04/16/21 0544 04/16/21 0808  BP: 126/74 (!) 153/74 (!) 145/79 (!) 149/99  Pulse: 88 (!) 108 87 93  Resp: (!) 25 (!) 22 (!) 22 18  Temp:  (!) 97.5 F (36.4 C) 98.4 F (36.9 C) 98.1 F (36.7 C)  TempSrc:   Oral   SpO2: 93% 90% 92% 98%  Weight:      Height:        Intake/Output Summary (Last 24 hours) at 04/16/2021 1048 Last data filed at 04/16/2021 1000 Gross per 24 hour  Intake 1320 ml  Output 800 ml  Net 520 ml   Filed Weights   04/14/21 1247 04/14/21 1659  Weight: 91.2 kg 89 kg    Examination:  General exam: Appears calm and comfortable  Respiratory system: Decreased breathing sounds with mild crackles and wheezing.  Respiratory effort normal. Cardiovascular system: S1 &  S2 heard, RRR. No JVD, murmurs, rubs, gallops or clicks. No pedal edema. Gastrointestinal system: Abdomen is nondistended, soft and nontender. No organomegaly or masses felt. Normal bowel sounds heard. Central nervous system: Alert and oriented. No focal neurological deficits. Extremities: Symmetric 5 x 5 power. Skin: No rashes, lesions or ulcers Psychiatry: Judgement and insight appear normal. Mood & affect appropriate.     Data Reviewed: I have personally reviewed following labs and imaging studies  CBC: Recent Labs  Lab 04/14/21 1300 04/15/21 0618   WBC 8.9 10.3  NEUTROABS 6.6  --   HGB 11.9* 11.2*  HCT 38.2 36.3  MCV 95.5 94.3  PLT 298 932   Basic Metabolic Panel: Recent Labs  Lab 04/14/21 1300 04/16/21 0348  NA 138 139  K 4.8 4.6  CL 93* 95*  CO2 37* 36*  GLUCOSE 163* 165*  BUN 13 19  CREATININE 0.79 0.77  CALCIUM 9.7 9.1  MG  --  1.7   GFR: Estimated Creatinine Clearance: 71.2 mL/min (by C-G formula based on SCr of 0.77 mg/dL). Liver Function Tests: Recent Labs  Lab 04/14/21 1300 04/16/21 0348  AST 20 16  ALT 9 5  ALKPHOS 79 64  BILITOT 0.4 0.5  PROT 7.9 6.5  ALBUMIN 3.5 2.9*   No results for input(s): LIPASE, AMYLASE in the last 168 hours. No results for input(s): AMMONIA in the last 168 hours. Coagulation Profile: No results for input(s): INR, PROTIME in the last 168 hours. Cardiac Enzymes: No results for input(s): CKTOTAL, CKMB, CKMBINDEX, TROPONINI in the last 168 hours. BNP (last 3 results) No results for input(s): PROBNP in the last 8760 hours. HbA1C: No results for input(s): HGBA1C in the last 72 hours. CBG: Recent Labs  Lab 04/15/21 0740 04/15/21 1135 04/15/21 1549 04/15/21 2119 04/16/21 0817  GLUCAP 181* 324* 81 159* 165*   Lipid Profile: No results for input(s): CHOL, HDL, LDLCALC, TRIG, CHOLHDL, LDLDIRECT in the last 72 hours. Thyroid Function Tests: No results for input(s): TSH, T4TOTAL, FREET4, T3FREE, THYROIDAB in the last 72 hours. Anemia Panel: No results for input(s): VITAMINB12, FOLATE, FERRITIN, TIBC, IRON, RETICCTPCT in the last 72 hours. Sepsis Labs: Recent Labs  Lab 04/14/21 1618 04/14/21 1932 04/15/21 0008  PROCALCITON 0.10  --   --   LATICACIDVEN 1.1 2.7* 1.5    Recent Results (from the past 240 hour(s))  Resp Panel by RT-PCR (Flu A&B, Covid) Nasopharyngeal Swab     Status: None   Collection Time: 04/14/21  1:07 PM   Specimen: Nasopharyngeal Swab; Nasopharyngeal(NP) swabs in vial transport medium  Result Value Ref Range Status   SARS Coronavirus 2 by RT PCR  NEGATIVE NEGATIVE Final    Comment: (NOTE) SARS-CoV-2 target nucleic acids are NOT DETECTED.  The SARS-CoV-2 RNA is generally detectable in upper respiratory specimens during the acute phase of infection. The lowest concentration of SARS-CoV-2 viral copies this assay can detect is 138 copies/mL. A negative result does not preclude SARS-Cov-2 infection and should not be used as the sole basis for treatment or other patient management decisions. A negative result may occur with  improper specimen collection/handling, submission of specimen other than nasopharyngeal swab, presence of viral mutation(s) within the areas targeted by this assay, and inadequate number of viral copies(<138 copies/mL). A negative result must be combined with clinical observations, patient history, and epidemiological information. The expected result is Negative.  Fact Sheet for Patients:  EntrepreneurPulse.com.au  Fact Sheet for Healthcare Providers:  IncredibleEmployment.be  This test is no t yet  approved or cleared by the [place] and  has been authorized for detection and/or diagnosis of SARS-CoV-2 by FDA under an Emergency Use Authorization (EUA). This EUA will remain  in effect (meaning this test can be used) for the duration of the COVID-19 declaration under Section 564(b)(1) of the Act, 21 U.S.C.section 360bbb-3(b)(1), unless the authorization is terminated  or revoked sooner.       Influenza A by PCR NEGATIVE NEGATIVE Final   Influenza B by PCR NEGATIVE NEGATIVE Final    Comment: (NOTE) The Xpert Xpress SARS-CoV-2/FLU/RSV plus assay is intended as an aid in the diagnosis of influenza from Nasopharyngeal swab specimens and should not be used as a sole basis for treatment. Nasal washings and aspirates are unacceptable for Xpert Xpress SARS-CoV-2/FLU/RSV testing.  Fact Sheet for Patients: EntrepreneurPulse.com.au  Fact Sheet for  Healthcare Providers: IncredibleEmployment.be  This test is not yet approved or cleared by the [place] FDA and has been authorized for detection and/or diagnosis of SARS-CoV-2 by FDA under an Emergency Use Authorization (EUA). This EUA will remain in effect (meaning this test can be used) for the duration of the COVID-19 declaration under Section 564(b)(1) of the Act, 21 U.S.C. section 360bbb-3(b)(1), unless the authorization is terminated or revoked.  Performed at [facility], [address] [place], [place] [address]   CULTURE, BLOOD (ROUTINE X 2) w Reflex to ID Panel     Status: None (Preliminary result)   Collection Time: 04/14/21  4:17 PM   Specimen: BLOOD  Result Value Ref Range Status   Specimen Description BLOOD BLOOD LEFT FOREARM  Final   Special Requests   Final    BOTTLES DRAWN AEROBIC AND ANAEROBIC Blood Culture adequate volume   Culture   Final    NO GROWTH < 24 HOURS Performed at [facility], [address] [place], [place] [address]    Report Status PENDING  Incomplete  CULTURE, BLOOD (ROUTINE X 2) w Reflex to ID Panel     Status: None (Preliminary result)   Collection Time: 04/14/21  4:18 PM   Specimen: BLOOD  Result Value Ref Range Status   Specimen Description BLOOD BLOOD LEFT HAND  Final   Special Requests   Final    BOTTLES DRAWN AEROBIC AND ANAEROBIC Blood Culture adequate volume   Culture   Final    NO GROWTH < 24 HOURS Performed at [facility], [address], [place], [place] [address]    Report Status PENDING  Incomplete         Radiology Studies: DG Chest Portable 1 View  Result Date: 04/14/2021 CLINICAL DATA:  Shortness of breath EXAM: PORTABLE CHEST 1 VIEW COMPARISON:  03/23/2021 FINDINGS: Persistent in similar appearance of interstitial prominence and patchy density at the left greater than right lung bases. Possible trace left pleural effusion. Stable cardiomediastinal contours with  normal heart size. IMPRESSION: Similar appearance to prior study. May reflect persistent atypical pneumonia. Electronically Signed   By: Macy Mis M.D.   On: 04/14/2021 13:36        Scheduled Meds:  amLODipine  10 mg Oral Daily   aspirin  81 mg Oral Daily   carbidopa-levodopa  1 tablet Oral TID   doxycycline  100 mg Oral Q12H   enoxaparin (LOVENOX) injection  0.5 mg/kg Subcutaneous Q24H   ferrous sulfate  325 mg Oral BID WC   gabapentin  400 mg Oral TID   insulin aspart  0-5 Units Subcutaneous QHS   insulin  aspart  0-9 Units Subcutaneous TID WC   ipratropium-albuterol  3 mL Nebulization Q4H   metoprolol tartrate  25 mg Oral BID   multivitamin with minerals  1 tablet Oral Daily   nicotine  21 mg Transdermal Daily   pantoprazole  40 mg Oral Daily   pravastatin  80 mg Oral QPM   predniSONE  40 mg Oral Q breakfast   spironolactone  25 mg Oral Daily   venlafaxine XR  150 mg Oral Q breakfast   cyanocobalamin  1,000 mcg Oral Daily   Continuous Infusions:   LOS: 2 days    Time spent: 28 minutes    Sharen Hones, MD [facility]   To contact the attending provider between 7A-7P or the covering provider during after hours 7P-7A, please log into the web site [email or web address] and access using universal [place] password for that web site. If you do not have the password, please call the hospital operator.  04/16/2021, 10:48 AM

## 2021-04-16 NOTE — Progress Notes (Signed)
Patient having frequent PVCs/bigeminy. No complaints at this time. B/P 126/74.  Notified Dr. Sidney Ace, order received.

## 2021-04-16 NOTE — Progress Notes (Signed)
   04/16/21 0501  Assess: MEWS Score  Temp (!) 97.5 F (36.4 C)  BP (!) 153/74  Pulse Rate (!) 108  ECG Heart Rate (!) 108  Resp (!) 22  Level of Consciousness Alert  SpO2 90 %  O2 Device Nasal Cannula  O2 Flow Rate (L/min) 5 L/min  Assess: MEWS Score  MEWS Temp 0  MEWS Systolic 0  MEWS Pulse 1  MEWS RR 1  MEWS LOC 0  MEWS Score 2  MEWS Score Color Yellow  Assess: if the MEWS score is Yellow or Red  Were vital signs taken at a resting state? Yes  Focused Assessment No change from prior assessment  Does the patient meet 2 or more of the SIRS criteria? Yes  Does the patient have a confirmed or suspected source of infection? Yes  Provider and Rapid Response Notified? Yes  MEWS guidelines implemented *See Row Information* No, vital signs rechecked  Treat  MEWS Interventions Other (Comment) (rechecked vitals, notified dr Sidney Ace)  Pain Scale 0-10  Pain Score 0  Take Vital Signs  Increase Vital Sign Frequency  Yellow: Q 2hr X 2 then Q 4hr X 2, if remains yellow, continue Q 4hrs  Escalate  MEWS: Escalate Yellow: discuss with charge nurse/RN and consider discussing with provider and RRT  Notify: Charge Nurse/RN  Name of Charge Nurse/RN Notified [email or web address]  Date Charge Nurse/RN Notified 04/16/21  Time Charge Nurse/RN Notified [id number]  Notify: Provider  Provider Name/Title Mansy  Date Provider Notified 04/16/21  Time Provider Notified [phone]  Notification Type Page  Notification Reason Other (Comment) (protocol)  Provider response No new orders  Date of Provider Response 04/16/21  Time of Provider Response [phone]  Document  Patient Outcome Other (Comment) (stable when recheck)  Assess: SIRS CRITERIA  SIRS Temperature  0  SIRS Pulse 1  SIRS Respirations  1  SIRS WBC 0  SIRS Score Sum  2

## 2021-04-16 NOTE — Plan of Care (Signed)

## 2021-04-17 DIAGNOSIS — I2510 Atherosclerotic heart disease of native coronary artery without angina pectoris: Secondary | ICD-10-CM

## 2021-04-17 LAB — GLUCOSE, CAPILLARY: Glucose-Capillary: 151 mg/dL — ABNORMAL HIGH (ref 70–99)

## 2021-04-17 MED ORDER — IPRATROPIUM-ALBUTEROL 0.5-2.5 (3) MG/3ML IN SOLN: 3.0000 mL | Freq: Four times a day (QID) | RESPIRATORY_TRACT | 0 refills | Status: AC

## 2021-04-17 MED ORDER — NICOTINE 21 MG/24HR TD PT24: 21.0000 mg | MEDICATED_PATCH | Freq: Every day | TRANSDERMAL | 0 refills | Status: DC

## 2021-04-17 MED ORDER — PREDNISONE 10 MG PO TABS: ORAL_TABLET | ORAL | 0 refills | Status: AC

## 2021-04-17 NOTE — Care Management Important Message (Signed)
Important Message  Patient Details  Name: Suzanne Glenn MRN: [id number] Date of Birth: 1951/02/18   Medicare Important Message Given:  Yes     Dannette Barbara 04/17/2021, 11:02 AM

## 2021-04-17 NOTE — TOC Initial Note (Signed)
Transition of Care [facility]) - Initial/Assessment Note    Patient Details  Name: Suzanne Glenn MRN: [id number] Date of Birth: [birth year]  [facility] of [facility]) CM/SW Contact:    Alberteen Sam, LCSW Phone Number: 04/17/2021, 9:42 AM  Clinical Narrative:                  CSW completed readmission risk assessment, patient confirms having PCP Dr. Ginette Pitman, reports having no issues getting her medications at [facility] in [place]. Reports she lives with her husband who will be picking her up today and bringing her home oxygen. Patient reports having a walker and tub bench at home,no other equipment needs identified. No discharge needs identified at this time.   Expected Discharge Plan: Home/Self Care Barriers to Discharge: No Barriers Identified   Patient Goals and CMS Choice Patient states their goals for this hospitalization and ongoing recovery are:: to go home CMS Medicare.gov Compare Post Acute Care list provided to:: Patient Choice offered to / list presented to : Patient  Expected Discharge Plan and Services Expected Discharge Plan: Home/Self Care         Expected Discharge Date: 04/17/21                                    Prior Living Arrangements/Services   Lives with:: Spouse                   Activities of Daily Living Home Assistive Devices/Equipment: None ADL Screening (condition at time of admission) Patient's cognitive ability adequate to safely complete daily activities?: Yes Is the patient deaf or have difficulty hearing?: No Does the patient have difficulty seeing, even when wearing glasses/contacts?: No Does the patient have difficulty concentrating, remembering, or making decisions?: No Patient able to express need for assistance with ADLs?: Yes Does the patient have difficulty dressing or bathing?: No Independently performs ADLs?: Yes (appropriate for developmental age) Does the patient have difficulty walking or climbing stairs?:  Yes Weakness of Legs: Both Weakness of Arms/Hands: None  Permission Sought/Granted                  Emotional Assessment              Admission diagnosis:  COPD exacerbation ([place]) [J44.1] Patient Active Problem List   Diagnosis Date Noted   COPD exacerbation ([place]) 04/14/2021   HLD (hyperlipidemia) 04/14/2021   Diabetes mellitus without complication ([place]) 09/38/1829   Depression 04/14/2021   CAD (coronary artery disease) 04/14/2021   Iron deficiency anemia 04/14/2021   Sepsis ([place]) 04/14/2021   RLS (restless legs syndrome) 04/14/2021   Acute on chronic respiratory failure with hypoxia ([place]) 03/20/2021   Atypical pneumonia 03/19/2021   MDD (major depressive disorder), recurrent, in full remission ([place]) 12/11/2019   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[place]) 07/26/2019   Goals of care, counseling/discussion 07/26/2019   Major depressive disorder, recurrent, in partial remission ([place]) 03/27/2019   Chronic respiratory failure with hypoxia ([place]) 03/27/2019   Urge incontinence of urine 03/27/2019   Tobacco use 01/13/2019   Mass of upper lobe of left lung 01/13/2019   Symptomatic anemia 01/01/2019   Restless leg syndrome 06/30/2018   Pure hypercholesterolemia 09/26/2017   Sepsis due to pneumonia ([place]) 02/24/2017   Bilateral carotid artery stenosis 03/30/2016   Syncope and collapse 03/30/2016   Essential (primary) hypertension 08/29/2015   Controlled type 2 diabetes mellitus  without complication ([place]) 65/53/7482   Combined fat and carbohydrate induced hyperlipemia 06/30/2015   Clinical depression 05/05/2015   GAD (generalized anxiety disorder) 05/05/2015   Depression, major, recurrent, moderate ([place]) 05/05/2015   Benign essential HTN 04/05/2015   Diabetes mellitus, type 2 ([place]) 02/09/2015   Type 2 diabetes mellitus ([place]) 02/09/2015   Cannot sleep 01/24/2015   Fibromyalgia 01/24/2015   CAFL (chronic airflow limitation) ([place]) 01/24/2015   H/O diabetes mellitus  01/24/2015   H/O hypercholesterolemia 01/24/2015   H/O: HTN (hypertension) 01/24/2015   Coronary artery disease 01/24/2015   Hypokalemia 01/24/2015   Arteriosclerosis of coronary artery 10/04/2014   Chest pain 10/04/2014   3-vessel CAD 08/02/2014   Acute chest pain 06/25/2014   SOB (shortness of breath) 06/25/2014   COPD, moderate ([place]) 06/08/2014   Moderate COPD (chronic obstructive pulmonary disease) ([place]) 06/08/2014   Gastro-esophageal reflux disease without esophagitis 05/31/2014   PCP:  Tracie Harrier, MD Pharmacy:   [place], [address] [place] [address] Phone: [phone] Fax: [phone]     Social Determinants of Health (SDOH) Interventions    Readmission Risk Interventions Readmission Risk Prevention Plan 03/21/2021  Transportation Screening Complete  PCP or Specialist Appt within 3-5 Days Complete  Social Work Consult for [place] Planning/Counseling Complete  Palliative Care Screening Not Applicable  Medication Review Press photographer) Complete  Some recent data might be hidden

## 2021-04-17 NOTE — Plan of Care (Signed)

## 2021-04-17 NOTE — Plan of Care (Signed)
  Problem: Education: Goal: Knowledge of General Education information will improve Description: Including pain rating scale, medication(s)/side effects and non-pharmacologic comfort measures Outcome: Adequate for Discharge   Problem: Health Behavior/Discharge Planning: Goal: Ability to manage health-related needs will improve Outcome: Adequate for Discharge   Problem: Clinical Measurements: Goal: Ability to maintain clinical measurements within normal limits will improve Outcome: Adequate for Discharge   Problem: Safety: Goal: Ability to remain free from injury will improve Outcome: Adequate for Discharge   Problem: Activity: Goal: Ability to tolerate increased activity will improve Outcome: Adequate for Discharge

## 2021-04-17 NOTE — Discharge Summary (Signed)
Physician Discharge Summary  Patient ID: Suzanne Glenn MRN: [id number] DOB/AGE: 07-08-1951 70 y.o.  Admit date: 04/14/2021 Discharge date: 04/17/2021  Admission Diagnoses:  Discharge Diagnoses:  Principal Problem:   Acute on chronic respiratory failure with hypoxia ([place]) Active Problems:   Coronary artery disease   Benign essential HTN   Gastro-esophageal reflux disease without esophagitis   Tobacco use   COPD exacerbation (HCC)   Diabetes mellitus without complication (HCC)   Depression   CAD (coronary artery disease)   Iron deficiency anemia   RLS (restless legs syndrome)   Discharged Condition: good  Hospital Course:  Suzanne Glenn is a 70 y.o. female with medical history significant of hypertension, hyperlipidemia, diabetes mellitus, COPD on 2 L oxygen, asthma, GERD, depression, anxiety, RLS, lung cancer, CAD, carotid artery disease, tobacco abuse, iron deficiency anemia, who presents with shortness of breath. Patient is diagnosed with COPD exacerbation, was started on IV steroids, changed to oral on 7/2 due to severe hyperglycemia.  #1.  Acute on chronic hypoxemic respiratory failure. COPD exacerbation. Patient is treated with IV steroids, then changed to oral due to severe hyperglycemia.  Condition so far had improved, she is back on 3 L oxygen with good saturation.  She should be able to take a 2 L at this point.  She no longer has any bronchospasm.  I will continue steroid taper for 6 days. Patient will follow-up with PCP in 1 week.  Also prescribed DuoNeb, patient has a nebulizer at home.   #2.  Essential hypertension. Continue home medicines.  3.  Uncontrolled type 2 diabetes with hyperglycemia Glucose was running high while on IV steroids, glucose is better after change to oral.  Follow-up with PCP as outpatient.  4.  Iron deficient anemia. Hemoglobin stable.  Consults: None  Significant Diagnostic Studies:  PORTABLE CHEST 1 VIEW   COMPARISON:   03/23/2021   FINDINGS: Persistent in similar appearance of interstitial prominence and patchy density at the left greater than right lung bases. Possible trace left pleural effusion. Stable cardiomediastinal contours with normal heart size.   IMPRESSION: Similar appearance to prior study. May reflect persistent atypical pneumonia.     Electronically Signed   By: Macy Mis M.D.   On: 04/14/2021 13:36     Treatments: IV steroids  Discharge Exam: Blood pressure 130/68, pulse 98, temperature 98.1 F (36.7 C), resp. rate 18, height 5\' 3"  (1.6 m), weight 89 kg, SpO2 93 %. General appearance: alert and cooperative Resp: Decreased breathing sounds without wheezes or crackles. Cardio: regular rate and rhythm, S1, S2 normal, no murmur, click, rub or gallop GI: soft, non-tender; bowel sounds normal; no masses,  no organomegaly Extremities: extremities normal, atraumatic, no cyanosis or edema  Disposition: Discharge disposition: 01-Home or Self Care       Discharge Instructions     Diet - low sodium heart healthy   Complete by: As directed    Increase activity slowly   Complete by: As directed       Allergies as of 04/17/2021       Reactions   Diclofenac-misoprostol Other (See Comments)   Arthrotec - gastritis    Nsaids Other (See Comments)   gastritis   Zolpidem Other (See Comments)   Sleep walking    Hydrocodone-acetaminophen Nausea And Vomiting   Simvastatin Other (See Comments)   body aches        Medication List     STOP taking these medications    potassium chloride 8 MEQ tablet  Commonly known as: KLOR-CON   predniSONE 10 MG (21) Tbpk tablet Commonly known as: STERAPRED UNI-PAK 21 TAB Replaced by: predniSONE 10 MG tablet       TAKE these medications    Accu-Chek FastClix Lancets Misc   Accu-Chek SmartView test strip Generic drug: glucose blood   albuterol 108 (90 Base) MCG/ACT inhaler Commonly known as: VENTOLIN HFA Inhale 2 puffs  into the lungs every 4 (four) hours as needed for wheezing or shortness of breath.   amLODipine 10 MG tablet Commonly known as: NORVASC Take 10 mg by mouth daily.   aspirin 81 MG chewable tablet Chew 1 tablet (81 mg total) by mouth daily.   carbidopa-levodopa 25-100 MG tablet Commonly known as: SINEMET IR Take 1 tablet by mouth 3 (three) times daily.   cyanocobalamin 1000 MCG tablet Take 1 tablet by mouth daily.   esomeprazole 40 MG capsule Commonly known as: NEXIUM Take 40 mg by mouth daily.   Eszopiclone 3 MG Tabs Commonly known as: eszopiclone Take 1 tablet (3 mg total) by mouth at bedtime as needed. Take immediately before bedtime   ferrous sulfate 325 (65 FE) MG tablet Take 1 tablet (325 mg total) by mouth 2 (two) times daily with a meal.   Fluticasone-Umeclidin-Vilant 100-62.5-25 MCG/INH Aepb Inhale 1 puff into the lungs daily. Treligy   gabapentin 400 MG capsule Commonly known as: NEURONTIN Take 1 capsule (400 mg total) by mouth 3 (three) times daily.   ipratropium-albuterol 0.5-2.5 (3) MG/3ML Soln Commonly known as: DUONEB Take 3 mLs by nebulization every 6 (six) hours.   metFORMIN 500 MG tablet Commonly known as: GLUCOPHAGE Take 500 mg by mouth daily with breakfast.   metoprolol tartrate 25 MG tablet Commonly known as: LOPRESSOR Take 25 mg by mouth 2 (two) times daily.   multivitamin tablet Take 1 tablet by mouth daily.   OXYGEN Inhale 2 L into the lungs continuous.   pravastatin 80 MG tablet Commonly known as: PRAVACHOL Take 80 mg by mouth every evening.   predniSONE 10 MG tablet Commonly known as: DELTASONE Take 4 tablets (40 mg total) by mouth daily for 3 days, THEN 2 tablets (20 mg total) daily for 3 days, THEN 1 tablet (10 mg total) daily for 3 days. Start taking on: April 17, 2021 Replaces: predniSONE 10 MG (21) Tbpk tablet   spironolactone 25 MG tablet Commonly known as: ALDACTONE Take 25 mg by mouth daily.   venlafaxine XR 150 MG 24 hr  capsule Commonly known as: EFFEXOR-XR Take 1 capsule (150 mg total) by mouth daily with breakfast.        Follow-up Information     Hande, Vishwanath, MD Follow up in 1 week(s).   Specialty: Internal Medicine Contact information: [place] [address] [phone]                 Signed: Sharen Hones 04/17/2021, 9:29 AM

## 2021-04-18 LAB — LEGIONELLA PNEUMOPHILA SEROGP 1 UR AG: L. pneumophila Serogp 1 Ur Ag: NEGATIVE

## 2021-04-19 LAB — CULTURE, BLOOD (ROUTINE X 2)
Culture: NO GROWTH
Culture: NO GROWTH
Special Requests: ADEQUATE
Special Requests: ADEQUATE

## 2021-04-25 DIAGNOSIS — Z09 Encounter for follow-up examination after completed treatment for conditions other than malignant neoplasm: Secondary | ICD-10-CM | POA: Diagnosis not present

## 2021-04-25 DIAGNOSIS — C3490 Malignant neoplasm of unspecified part of unspecified bronchus or lung: Secondary | ICD-10-CM | POA: Diagnosis not present

## 2021-04-25 DIAGNOSIS — F324 Major depressive disorder, single episode, in partial remission: Secondary | ICD-10-CM | POA: Diagnosis not present

## 2021-04-25 DIAGNOSIS — J449 Chronic obstructive pulmonary disease, unspecified: Secondary | ICD-10-CM | POA: Diagnosis not present

## 2021-04-25 DIAGNOSIS — E119 Type 2 diabetes mellitus without complications: Secondary | ICD-10-CM | POA: Diagnosis not present

## 2021-04-25 DIAGNOSIS — J9620 Acute and chronic respiratory failure, unspecified whether with hypoxia or hypercapnia: Secondary | ICD-10-CM | POA: Diagnosis not present

## 2021-05-17 NOTE — Progress Notes (Deleted)
[facility] MD/PA/NP OP Progress Note  05/17/2021 11:04 AM Suzanne Glenn  MRN:  [id number]  Chief Complaint:  HPI: *** Suzanne Glenn is a 70 y.o. 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 [facility].    ? park[facility]  Check ferritin  Medication- venlafaxine 150 mg daily, gabapentin 600 mg TID, eszopiclone 3 mg at night  on carbidopa depression, anxiety, restless leg,   on metroprolol,   Visit Diagnosis: No diagnosis found.  Past Psychiatric History:  Outpatient:  Psychiatry admission:  Previous suicide attempt:  Past trials of medication:  History of violence:    Past Medical History:  Past Medical History:  Diagnosis Date   Anxiety    Asthma    Cancer ([place]) 12/2018   w/u for right upper lobe mass/cancer   COPD (chronic obstructive pulmonary disease) ([place])    also, emphysema. now using o2 via np 24 hours a day   Coronary artery disease    Depression    Diabetes mellitus, type II (HCC)    GERD (gastroesophageal reflux disease)    Headache    migraines in early 20's   Heart murmur    Hypertension    Lung cancer ([employer])    Neuropathy    Restless leg     Past Surgical History:  Procedure Laterality Date   BACK SURGERY  2005   surgery x 2, disc fused in neck, pinched nerve in center of back and neck   CARDIAC CATHETERIZATION  2015   1 stent placed for blockage   cervical bone infusion     CHOLECYSTECTOMY     THORACIC DISC SURGERY     TUBAL LIGATION     VIDEO BRONCHOSCOPY WITH ENDOBRONCHIAL ULTRASOUND N/A 03/06/2019   Procedure: VIDEO BRONCHOSCOPY WITH ENDOBRONCHIAL ULTRASOUND - DIABETIC;  Surgeon: Ottie Glazier, MD;  Location: [facility] ORS;  Service: Thoracic;  Laterality: N/A;    Family Psychiatric History:  ***  Family History:  Family History  Problem Relation Age of Onset   Anxiety disorder Mother    Cancer - Lung Mother    Depression Sister    Cancer Sister    Depression Brother    Cancer Sister    Cancer Sister    Cancer Sister    Heart Problems Sister    Bipolar disorder Sister    Diabetes Sister    Cancer - Lung Sister    Hypertension Sister    Diabetes Sister    Hyperlipidemia Sister    COPD Sister    Heart Problems Brother    Cancer Brother    Cancer Brother    Cancer Brother    Breast cancer Maternal Aunt     Social History:  Social History   Socioeconomic History   Marital status: Married    Spouse name: Abe People   Number of children: Not on file   Years of education: Not on file   Highest education level: Not on file  Occupational History    Comment: disabled  Tobacco Use   Smoking status: Every Day    Packs/day: 1.00    Years: 40.00    Pack years: 40.00    Types: Cigarettes    Start date: 05/05/1975   Smokeless tobacco: Never  Vaping Use   Vaping Use: Never  used  Substance and Sexual Activity   Alcohol use: No    Alcohol/week: 0.0 standard drinks    Comment: socially   Drug use: No   Sexual activity: Not Currently  Other Topics Concern   Not on file  Social History Narrative   Not on file   Social Determinants of Health   Financial Resource Strain: Not on file  Food Insecurity: Not on file  Transportation Needs: Not on file  Physical Activity: Not on file  Stress: Not on file  Social Connections: Not on file    Allergies:  Allergies  Allergen Reactions   Diclofenac-Misoprostol Other (See Comments)    Arthrotec - gastritis    Nsaids Other (See Comments)    gastritis   Zolpidem Other (See Comments)    Sleep walking    Hydrocodone-Acetaminophen Nausea And Vomiting   Simvastatin Other (See Comments)    body aches    Metabolic Disorder Labs: Lab Results  Component Value Date   HGBA1C 6.4 (H) 01/02/2019   MPG 136.98 01/02/2019    No results found for: PROLACTIN No results found for: CHOL, TRIG, HDL, CHOLHDL, VLDL, LDLCALC No results found for: TSH  Therapeutic Level Labs: No results found for: LITHIUM No results found for: VALPROATE No components found for:  CBMZ  Current Medications: Current Outpatient Medications  Medication Sig Dispense Refill   ACCU-CHEK FASTCLIX LANCETS MISC      ACCU-CHEK SMARTVIEW test strip      albuterol (VENTOLIN HFA) 108 (90 Base) MCG/ACT inhaler Inhale 2 puffs into the lungs every 4 (four) hours as needed for wheezing or shortness of breath.      amLODipine (NORVASC) 10 MG tablet Take 10 mg by mouth daily.      carbidopa-levodopa (SINEMET IR) 25-100 MG tablet Take 1 tablet by mouth 3 (three) times daily.     cyanocobalamin 1000 MCG tablet Take 1 tablet by mouth daily.     esomeprazole (NEXIUM) 40 MG capsule Take 40 mg by mouth daily.     Eszopiclone (ESZOPICLONE) 3 MG TABS Take 1 tablet (3 mg total) by mouth at bedtime as needed. Take immediately before bedtime 30 tablet 2   ferrous sulfate 325 (65 FE) MG tablet Take 1 tablet (325 mg total) by mouth 2 (two) times daily with a meal. 30 tablet 3   Fluticasone-Umeclidin-Vilant 100-62.5-25 MCG/INH AEPB Inhale 1 puff into the lungs daily. Treligy     gabapentin (NEURONTIN) 400 MG capsule Take 1 capsule (400 mg total) by mouth 3 (three) times daily. 90 capsule 0   ipratropium-albuterol (DUONEB) 0.5-2.5 (3) MG/3ML SOLN Take 3 mLs by nebulization every 6 (six) hours. 360 mL 0   metFORMIN (GLUCOPHAGE) 500 MG tablet Take 500 mg by mouth daily with breakfast.      metoprolol tartrate (LOPRESSOR) 25 MG tablet Take 25 mg by mouth 2 (two) times daily.      Multiple Vitamin (MULTIVITAMIN) tablet Take 1 tablet by mouth daily.     nicotine (NICODERM CQ - DOSED IN MG/24 HOURS) 21 mg/24hr patch Place 1 patch (21 mg total) onto the skin daily. 28 patch 0   OXYGEN Inhale 2 L into the lungs continuous.     pravastatin (PRAVACHOL) 80 MG tablet Take 80  mg by mouth every evening.      spironolactone (ALDACTONE) 25 MG tablet Take 25 mg by mouth daily.      venlafaxine XR (EFFEXOR-XR) 150 MG 24 hr capsule Take 1 capsule (150 mg total) by mouth daily  with breakfast. 90 capsule 0   No current facility-administered medications for this visit.     Musculoskeletal: Strength & Muscle Tone:  N/A Gait & Station:  N/A Patient leans: N/A  Psychiatric Specialty Exam: Review of Systems  There were no vitals taken for this visit.There is no height or weight on file to calculate BMI.  General Appearance: {Appearance:22683}  Eye Contact:  {BHH EYE CONTACT:22684}  Speech:  Clear and Coherent  Volume:  Normal  Mood:  {BHH MOOD:22306}  Affect:  {Affect (PAA):22687}  Thought Process:  Coherent  Orientation:  Full (Time, Place, and Person)  Thought Content: Logical   Suicidal Thoughts:  {ST/HT (PAA):22692}  Homicidal Thoughts:  {ST/HT (PAA):22692}  Memory:  Immediate;   Good  Judgement:  {Judgement (PAA):22694}  Insight:  {Insight (PAA):22695}  Psychomotor Activity:  Normal  Concentration:  Concentration: Good and Attention Span: Good  Recall:  Good  Fund of Knowledge: Good  Language: Good  Akathisia:  No  Handed:  Right  AIMS (if indicated): not done  Assets:  Communication Skills Desire for Improvement  ADL's:  Intact  Cognition: WNL  Sleep:  {BHH GOOD/FAIR/POOR:22877}   Screenings: PHQ2-9    Flowsheet Row Video Visit from 12/22/2020 in [facility] Patient Outreach Telephone from 02/28/2017 in [facility]  PHQ-2 Total Score 0 1      [facility] ED to Hosp-Admission (Discharged) from 04/14/2021 in [facility] ED to Hosp-Admission (Discharged) from 03/19/2021 in [facility] Video Visit from 12/22/2020 in [facility] No Risk No Risk No Risk        Assessment and Plan:     Plan   The patient demonstrates the following risk factors for suicide: Chronic risk factors for suicide include: {Chronic Risk Factors for TMLYYTK:35465681}. Acute risk factors for suicide include: {Acute Risk Factors for EXNTZGY:17494496}. Protective factors for this patient include: {Protective Factors for Suicide PRFF:63846659}. Considering these factors, the overall suicide risk at this point appears to be {Desc; low/moderate/high:110033}. Patient {ACTION; IS/IS DJT:70177939} appropriate for outpatient follow up.        Norman Clay, MD 05/17/2021, 11:04 AM

## 2021-05-22 ENCOUNTER — Telehealth: Payer: Self-pay | Admitting: Psychiatry

## 2021-05-22 ENCOUNTER — Other Ambulatory Visit: Payer: Self-pay

## 2021-05-22 ENCOUNTER — Telehealth: Payer: Medicare HMO | Admitting: Psychiatry

## 2021-05-22 DIAGNOSIS — N3941 Urge incontinence: Secondary | ICD-10-CM | POA: Diagnosis not present

## 2021-05-22 DIAGNOSIS — I2510 Atherosclerotic heart disease of native coronary artery without angina pectoris: Secondary | ICD-10-CM | POA: Diagnosis not present

## 2021-05-22 DIAGNOSIS — K219 Gastro-esophageal reflux disease without esophagitis: Secondary | ICD-10-CM | POA: Diagnosis not present

## 2021-05-22 DIAGNOSIS — J449 Chronic obstructive pulmonary disease, unspecified: Secondary | ICD-10-CM | POA: Diagnosis not present

## 2021-05-22 DIAGNOSIS — J9611 Chronic respiratory failure with hypoxia: Secondary | ICD-10-CM | POA: Diagnosis not present

## 2021-05-22 DIAGNOSIS — I10 Essential (primary) hypertension: Secondary | ICD-10-CM | POA: Diagnosis not present

## 2021-05-22 DIAGNOSIS — Z6836 Body mass index (BMI) 36.0-36.9, adult: Secondary | ICD-10-CM | POA: Diagnosis not present

## 2021-05-22 DIAGNOSIS — E119 Type 2 diabetes mellitus without complications: Secondary | ICD-10-CM | POA: Diagnosis not present

## 2021-05-22 DIAGNOSIS — Z720 Tobacco use: Secondary | ICD-10-CM | POA: Diagnosis not present

## 2021-05-22 NOTE — Telephone Encounter (Signed)
Called the patient three times for appointment scheduled today (mobile and home phone). The patient did not answer the phone. Left voice message to contact the office [phone]).

## 2021-05-30 NOTE — Progress Notes (Signed)
Virtual Visit via Telephone Note  I connected with BENISHA HADAWAY on 05/31/21 at  1:00 PM EDT by telephone and verified that I am speaking with the correct person using two identifiers.  Location: Patient: home Provider: office Persons participated in the visit- patient, provider    I discussed the limitations, risks, security and privacy concerns of performing an evaluation and management service by telephone and the availability of in person appointments. I also discussed with the patient that there may be a patient responsible charge related to this service. The patient expressed understanding and agreed to proceed.     I discussed the assessment and treatment plan with the patient. The patient was provided an opportunity to ask questions and all were answered. The patient agreed with the plan and demonstrated an understanding of the instructions.   The patient was advised to call back or seek an in-person evaluation if the symptoms worsen or if the condition fails to improve as anticipated.  I provided 31 minutes of non-face-to-face time during this encounter.   Norman Clay, MD     [facility] MD/PA/NP OP Progress Note  05/31/2021 1:48 PM SUTTYN CRYDER  MRN:  [id number]  Chief Complaint:  Chief Complaint   Follow-up; Depression    HPI:  DANESSA MENSCH is a 70 y.o. 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 Toy Care.   She states that she has been doing good.  Her medication was adjusted during the recent hospitalization and by her PCP.  Although she used to be anxious, being worried that something is going to happen all the time, it has been getting better since up titration of venlafaxine.  She has  shortness of breath, and there is some limit of her to the physical activity.  She is unsure of her current status of lung cancer, and she will have an follow-up appointment in a few months.  She reports good relationship with her husband and her son, who visits her every day.  Although she has some conflict with one of her sisters, she loves her, and that she wants to do some change as she does not know what will happen to her due to history of lung cancer.  She feels comfortable to stay on the current medication regimen.   She denies feeling depressed or anhedonia.  She has insomnia due to restless leg.  She feels anxious at times.  She denies panic attacks.  She denies SI.  She denies decreased need for sleep or euphonia.  She denies hallucinations.  She denies alcohol use or drug use.   Medication-venlafaxine was uptitrated to 225 mg by her PCP.  Gabapentin was tapered down from 600 mg 3 times daily to 400 mg 3 times daily due to experiencing some facial numbness.  Eszopiclone 3 mg at night   Daily routine: household chores, sees her son every day with 54 yo grandchildren  Exercise: Employment: used to work at [employer], [place] in [place]: husband  Household: husband Marital status: married for 62 years Number of children: 2. (Her daughter passed away in 01-09-2002 from diabetes at 69 year old) She grew up in [place]. She used to have a very close family. There is some discordance with one of her sisters  Visit Diagnosis:    ICD-10-CM   1. Restless leg syndrome  G25.81 Ferritin    2. MDD (major depressive disorder), recurrent, in partial remission (HCC)  F33.41 venlafaxine XR (EFFEXOR-XR) 150 MG 24 hr capsule    eszopiclone 3 MG TABS    3. GAD (generalized anxiety disorder)  F41.1 venlafaxine XR (EFFEXOR-XR) 150 MG 24 hr capsule      Past Psychiatric History:  Outpatient:  Psychiatry admission: denies Previous suicide attempt: denies Past trials of medication:  History of violence:     Past Medical History:  Past Medical History:  Diagnosis Date   Anxiety    Asthma    Cancer ([place]) 12/2018   w/u for right upper lobe mass/cancer   COPD (chronic obstructive pulmonary disease) ([place])    also, emphysema. now using o2 via np 24 hours a day   Coronary artery disease    Depression    Diabetes mellitus, type II (HCC)    GERD (gastroesophageal reflux disease)    Headache    migraines in early 20's   Heart murmur    Hypertension    Lung cancer ([place])    Neuropathy    Restless leg     Past Surgical History:  Procedure Laterality Date   BACK SURGERY  2005   surgery x 2, disc fused in neck, pinched nerve in center of back and neck   CARDIAC CATHETERIZATION  2015   1 stent placed for blockage   cervical bone infusion     CHOLECYSTECTOMY     THORACIC DISC SURGERY     TUBAL LIGATION     VIDEO BRONCHOSCOPY WITH ENDOBRONCHIAL ULTRASOUND N/A 03/06/2019   Procedure: VIDEO BRONCHOSCOPY WITH ENDOBRONCHIAL ULTRASOUND - DIABETIC;  Surgeon: Ottie Glazier, MD;  Location: [facility] ORS;  Service: Thoracic;  Laterality: N/A;    Family Psychiatric History: as below  Family History:  Family History  Problem Relation Age of Onset   Anxiety disorder Mother    Cancer - Lung Mother    Depression Sister    Cancer Sister    Cancer Sister    Cancer Sister    Cancer Sister    Heart Problems Sister    Bipolar disorder Sister    Diabetes Sister    Cancer - Lung Sister    Hypertension Sister    Diabetes Sister    Hyperlipidemia Sister    COPD Sister    Depression Brother    Heart Problems Brother    Cancer Brother    Cancer Brother    Cancer Brother    Breast cancer Maternal Aunt    Depression Son     Social History:  Social History   Socioeconomic History   Marital status: Married    Spouse name: Abe People   Number of children: Not on file   Years of education: Not on file   Highest education level: Not on file  Occupational History    Comment: disabled  Tobacco Use    Smoking status: Every Day    Packs/day: 1.00    Years: 40.00    Pack years: 40.00    Types: Cigarettes    Start date: 05/05/1975   Smokeless tobacco: Never  Vaping Use   Vaping Use: Never used  Substance and Sexual Activity   Alcohol use: No    Alcohol/week: 0.0 standard drinks    Comment: socially   Drug use: No   Sexual activity: Not Currently  Other Topics Concern   Not on file  Social  History Narrative   Not on file   Social Determinants of Health   Financial Resource Strain: Not on file  Food Insecurity: Not on file  Transportation Needs: Not on file  Physical Activity: Not on file  Stress: Not on file  Social Connections: Not on file    Allergies:  Allergies  Allergen Reactions   Diclofenac-Misoprostol Other (See Comments)    Arthrotec - gastritis    Nsaids Other (See Comments)    gastritis   Zolpidem Other (See Comments)    Sleep walking    Hydrocodone-Acetaminophen Nausea And Vomiting   Simvastatin Other (See Comments)    body aches    Metabolic Disorder Labs: Lab Results  Component Value Date   HGBA1C 6.4 (H) 01/02/2019   MPG 136.98 01/02/2019   No results found for: PROLACTIN No results found for: CHOL, TRIG, HDL, CHOLHDL, VLDL, LDLCALC No results found for: TSH  Therapeutic Level Labs: No results found for: LITHIUM No results found for: VALPROATE No components found for:  CBMZ  Current Medications: Current Outpatient Medications  Medication Sig Dispense Refill   venlafaxine XR (EFFEXOR-XR) 75 MG 24 hr capsule Take total of 225 mg daily, take along with 150 mg cap 90 capsule 0   ACCU-CHEK FASTCLIX LANCETS MISC      ACCU-CHEK SMARTVIEW test strip      albuterol (VENTOLIN HFA) 108 (90 Base) MCG/ACT inhaler Inhale 2 puffs into the lungs every 4 (four) hours as needed for wheezing or shortness of breath.      amLODipine (NORVASC) 10 MG tablet Take 10 mg by mouth daily.      carbidopa-levodopa (SINEMET IR) 25-100 MG tablet Take 1 tablet by mouth  3 (three) times daily.     cyanocobalamin 1000 MCG tablet Take 1 tablet by mouth daily.     esomeprazole (NEXIUM) 40 MG capsule Take 40 mg by mouth daily.     eszopiclone 3 MG TABS Take 1 tablet (3 mg total) by mouth at bedtime as needed. Take immediately before bedtime 30 tablet 2   ferrous sulfate 325 (65 FE) MG tablet Take 1 tablet (325 mg total) by mouth 2 (two) times daily with a meal. 30 tablet 3   Fluticasone-Umeclidin-Vilant 100-62.5-25 MCG/INH AEPB Inhale 1 puff into the lungs daily. Treligy     gabapentin (NEURONTIN) 400 MG capsule Take 1 capsule (400 mg total) by mouth 3 (three) times daily. 270 capsule 0   ipratropium-albuterol (DUONEB) 0.5-2.5 (3) MG/3ML SOLN Take 3 mLs by nebulization every 6 (six) hours. 360 mL 0   metFORMIN (GLUCOPHAGE) 500 MG tablet Take 500 mg by mouth daily with breakfast.      metoprolol tartrate (LOPRESSOR) 25 MG tablet Take 25 mg by mouth 2 (two) times daily.      Multiple Vitamin (MULTIVITAMIN) tablet Take 1 tablet by mouth daily.     nicotine (NICODERM CQ - DOSED IN MG/24 HOURS) 21 mg/24hr patch Place 1 patch (21 mg total) onto the skin daily. 28 patch 0   OXYGEN Inhale 2 L into the lungs continuous.     pravastatin (PRAVACHOL) 80 MG tablet Take 80 mg by mouth every evening.      spironolactone (ALDACTONE) 25 MG tablet Take 25 mg by mouth daily.      venlafaxine XR (EFFEXOR-XR) 150 MG 24 hr capsule Take total of 225 mg daily, take along with 75 mg cap 90 capsule 0   No current facility-administered medications for this visit.     Musculoskeletal: Strength &  Muscle Tone:  N/A Gait & Station:  N/A Patient leans: N/A  Psychiatric Specialty Exam: Review of Systems  Psychiatric/Behavioral:  Positive for sleep disturbance. Negative for agitation, behavioral problems, confusion, decreased concentration, dysphoric mood, hallucinations, self-injury and suicidal ideas. The patient is nervous/anxious. The patient is not hyperactive.   All other systems  reviewed and are negative.  There were no vitals taken for this visit.There is no height or weight on file to calculate BMI.  General Appearance: NA  Eye Contact:  NA  Speech:  Clear and Coherent  Volume:  Normal  Mood:   better  Affect:  NA  Thought Process:  Coherent  Orientation:  Full (Time, Place, and Person)  Thought Content: Logical   Suicidal Thoughts:  No  Homicidal Thoughts:  No  Memory:  Immediate;   Good  Judgement:  Good  Insight:  Good  Psychomotor Activity:  Normal  Concentration:  Concentration: Good and Attention Span: Good  Recall:  Good  Fund of Knowledge: Good  Language: Good  Akathisia:  No  Handed:  Right  AIMS (if indicated): not done  Assets:  Communication Skills Desire for Improvement  ADL's:  Intact  Cognition: WNL  Sleep:  Poor   Screenings: PHQ2-9    Flowsheet Row Video Visit from 05/31/2021 in [place] Video Visit from 12/22/2020 in [facility] Patient Outreach Telephone from 02/28/2017 in [place]  PHQ-2 Total Score 0 0 1      Flowsheet Row Video Visit from 05/31/2021 in [place] ED to Hosp-Admission (Discharged) from 04/14/2021 in [place] [facility] ED to Hosp-Admission (Discharged) from 03/19/2021 in [place] No Risk No Risk No Risk        Assessment and Plan:  REIZY DUNLOW is a 70 y.o. 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 Toy Care.   1. MDD (major depressive disorder), recurrent, in partial remission ([place]) 2. GAD (generalized anxiety disorder) Although she was experiencing  anxiety since the last visit with the provider, it has been improving since venlafaxine was uptitrated by her PCP.  Psychosocial stressors includes medical condition of COPD on oxygen mask, loss of her daughter from diabetes several years ago, and conflict with one of her sisters.  She has good relationship with her husband and her son, who visits her every day. Will continue current dose of venlafaxine to target depression and anxiety.   3. Restless leg syndrome She had adverse reaction of gabapentin; facial numbness when this medication was uptitrated.  Will continue current dose of gabapentin to target restless leg, and an anxiety.  Will recheck ferritin as it has not been checked at least for a few years according to the chart review.  Noted that she is also on Sinemet, prescribed by her PCP.   # Insomnia She reports insomnia due to restless leg.  She has occasional benefit from eszopiclone.  Will continue current dose to target insomnia.  Noted that she has snoring as well; she is advised to discuss with her pulmonologist whether she will be a good candidate for sleep study.    Plan Continue venlafaxine 225 mg daily Continue gabapentin 400 mg three times  a day (facial numbness from 600 mg TID) Continue eszopiclone 3 mg at night as needed for sleep Check Ferritin Next appointment- 10/31 at 9:30, in person - on metoprolol     The patient demonstrates the following risk factors for suicide: Chronic risk factors for suicide include: psychiatric disorder of depression . Acute risk factors for suicide include: family or marital conflict, unemployment, and loss (financial, interpersonal, professional). Protective factors for this patient include: positive social support. Considering these factors, the overall suicide risk at this point appears to be low. Patient is appropriate for outpatient follow up.        Norman Clay, MD 05/31/2021, 1:48 PM

## 2021-05-31 ENCOUNTER — Other Ambulatory Visit: Payer: Self-pay

## 2021-05-31 ENCOUNTER — Encounter: Payer: Self-pay | Admitting: Psychiatry

## 2021-05-31 ENCOUNTER — Telehealth (INDEPENDENT_AMBULATORY_CARE_PROVIDER_SITE_OTHER): Payer: Medicare HMO | Admitting: Psychiatry

## 2021-05-31 DIAGNOSIS — G2581 Restless legs syndrome: Secondary | ICD-10-CM | POA: Diagnosis not present

## 2021-05-31 DIAGNOSIS — F3341 Major depressive disorder, recurrent, in partial remission: Secondary | ICD-10-CM | POA: Diagnosis not present

## 2021-05-31 DIAGNOSIS — F411 Generalized anxiety disorder: Secondary | ICD-10-CM | POA: Diagnosis not present

## 2021-05-31 MED ORDER — GABAPENTIN 400 MG PO CAPS: 400.0000 mg | ORAL_CAPSULE | Freq: Three times a day (TID) | ORAL | 0 refills | Status: DC

## 2021-05-31 MED ORDER — ESZOPICLONE 3 MG PO TABS: 3.0000 mg | ORAL_TABLET | Freq: Every evening | ORAL | 2 refills | Status: DC | PRN

## 2021-05-31 MED ORDER — VENLAFAXINE HCL ER 75 MG PO CP24: ORAL_CAPSULE | ORAL | 0 refills | Status: DC

## 2021-05-31 MED ORDER — VENLAFAXINE HCL ER 150 MG PO CP24: ORAL_CAPSULE | ORAL | 0 refills | Status: DC

## 2021-05-31 NOTE — Patient Instructions (Signed)
Continue venlafaxine 225 mg daily Continue gabapentin 400 mg three times a day Continue eszopiclone 3 mg at night as needed for sleep Check Ferritin Next appointment- 10/31 at 9:30

## 2021-06-02 DIAGNOSIS — G2581 Restless legs syndrome: Secondary | ICD-10-CM | POA: Diagnosis not present

## 2021-06-02 DIAGNOSIS — R5383 Other fatigue: Secondary | ICD-10-CM | POA: Diagnosis not present

## 2021-06-03 ENCOUNTER — Encounter (HOSPITAL_COMMUNITY): Payer: Self-pay | Admitting: Psychiatry

## 2021-06-03 LAB — FERRITIN: Ferritin: 35 ng/mL (ref 15–150)

## 2021-06-03 NOTE — Progress Notes (Signed)
Could you contact the patient and inform that her ferritin level is low, which can affect her symptoms of restless leg syndrome. Please advise her to reach out to primary care for further guidance.

## 2021-07-02 IMAGING — DX DG CHEST 1V PORT
1 series · 1 of 1 positions shown · non-contrast
Comparison: CT fluoroscopy from the same day

CLINICAL DATA: Lung biopsy

EXAM:
PORTABLE CHEST 1 VIEW

[chest ap]
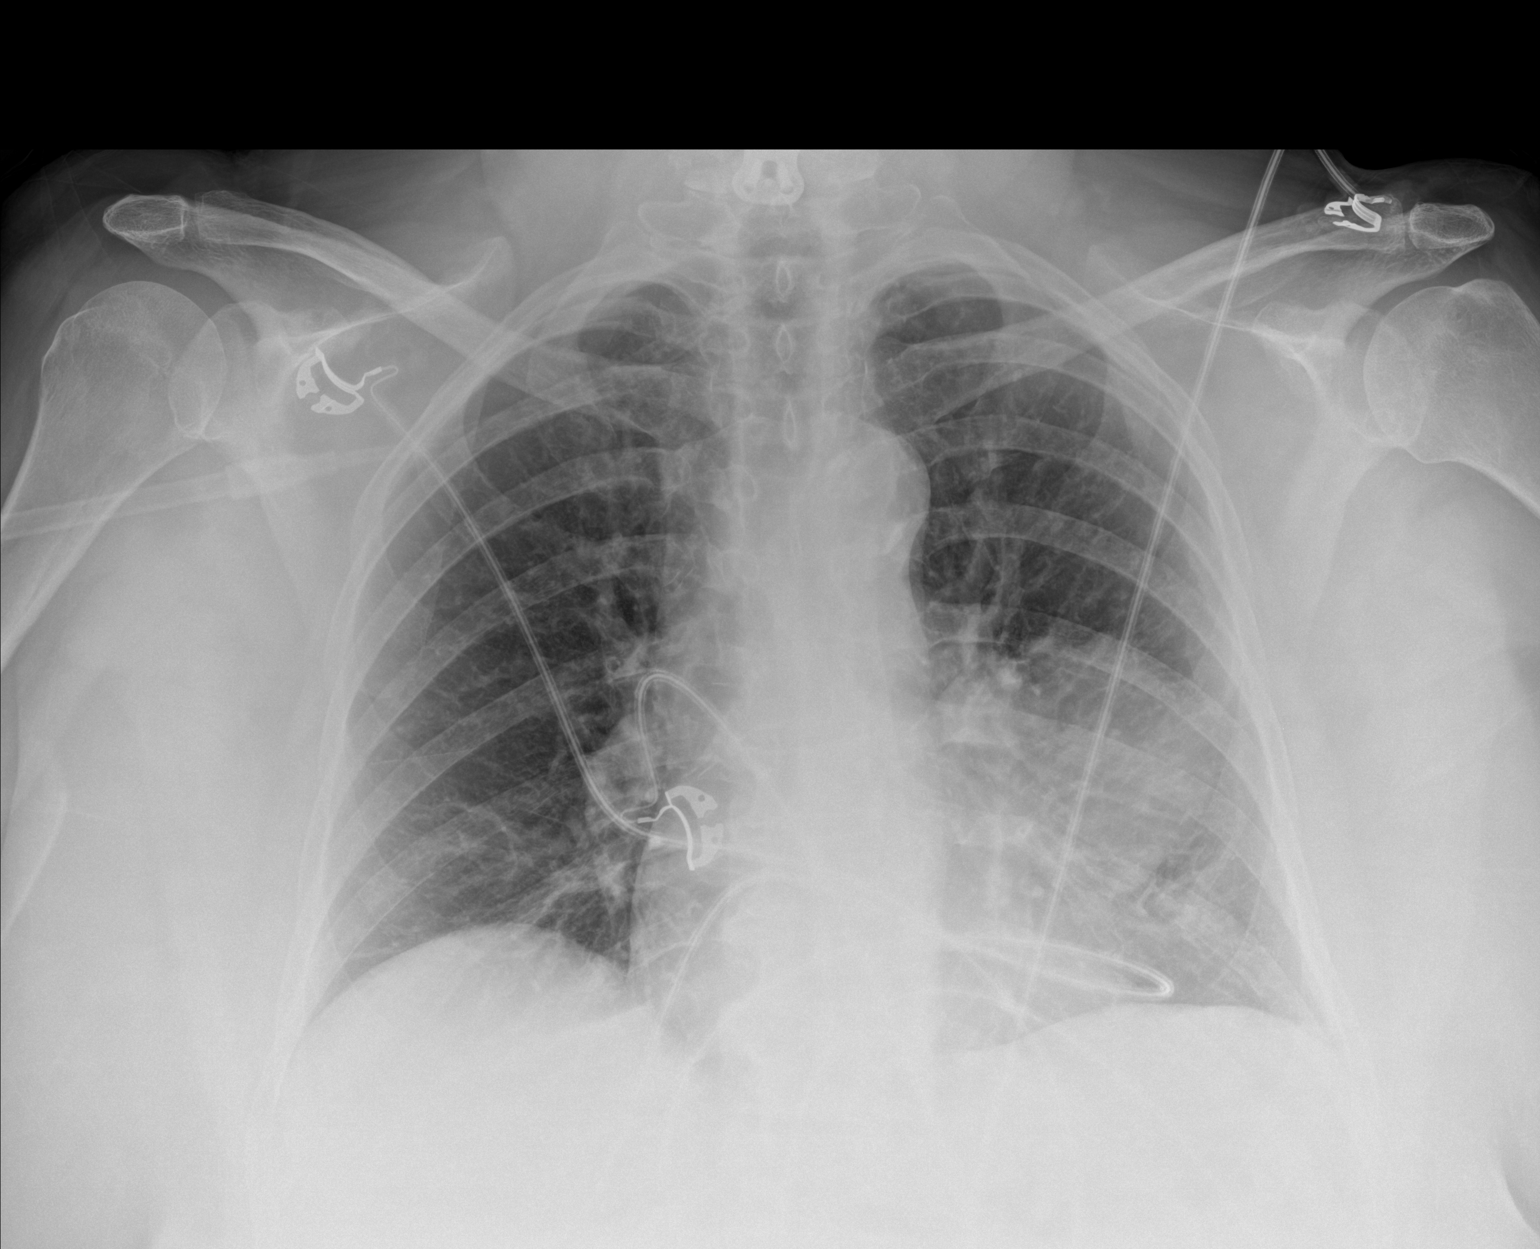

[1 of 1 positions shown; findings below may reference images not displayed]

FINDINGS: Known left lung mass contacting the left heart border. No evidence
of pneumothorax or bleeding. Normal heart size. No consolidation or
pleural fluid.
IMPRESSION: No evidence of complication after left lung mass biopsy.

## 2021-07-06 DIAGNOSIS — F17218 Nicotine dependence, cigarettes, with other nicotine-induced disorders: Secondary | ICD-10-CM | POA: Diagnosis not present

## 2021-07-06 DIAGNOSIS — R053 Chronic cough: Secondary | ICD-10-CM | POA: Diagnosis not present

## 2021-07-06 DIAGNOSIS — R918 Other nonspecific abnormal finding of lung field: Secondary | ICD-10-CM | POA: Diagnosis not present

## 2021-07-06 DIAGNOSIS — R059 Cough, unspecified: Secondary | ICD-10-CM | POA: Diagnosis not present

## 2021-07-06 DIAGNOSIS — J439 Emphysema, unspecified: Secondary | ICD-10-CM | POA: Diagnosis not present

## 2021-07-06 DIAGNOSIS — Z85118 Personal history of other malignant neoplasm of bronchus and lung: Secondary | ICD-10-CM | POA: Diagnosis not present

## 2021-07-06 DIAGNOSIS — R0609 Other forms of dyspnea: Secondary | ICD-10-CM | POA: Diagnosis not present

## 2021-07-12 DIAGNOSIS — N3941 Urge incontinence: Secondary | ICD-10-CM | POA: Diagnosis not present

## 2021-07-12 DIAGNOSIS — I10 Essential (primary) hypertension: Secondary | ICD-10-CM | POA: Diagnosis not present

## 2021-07-12 DIAGNOSIS — F3341 Major depressive disorder, recurrent, in partial remission: Secondary | ICD-10-CM | POA: Diagnosis not present

## 2021-07-12 DIAGNOSIS — C3492 Malignant neoplasm of unspecified part of left bronchus or lung: Secondary | ICD-10-CM | POA: Diagnosis not present

## 2021-07-12 DIAGNOSIS — K219 Gastro-esophageal reflux disease without esophagitis: Secondary | ICD-10-CM | POA: Diagnosis not present

## 2021-07-12 DIAGNOSIS — I2510 Atherosclerotic heart disease of native coronary artery without angina pectoris: Secondary | ICD-10-CM | POA: Diagnosis not present

## 2021-07-12 DIAGNOSIS — J9611 Chronic respiratory failure with hypoxia: Secondary | ICD-10-CM | POA: Diagnosis not present

## 2021-07-12 DIAGNOSIS — E782 Mixed hyperlipidemia: Secondary | ICD-10-CM | POA: Diagnosis not present

## 2021-07-12 DIAGNOSIS — E119 Type 2 diabetes mellitus without complications: Secondary | ICD-10-CM | POA: Diagnosis not present

## 2021-07-19 DIAGNOSIS — Z1211 Encounter for screening for malignant neoplasm of colon: Secondary | ICD-10-CM | POA: Diagnosis not present

## 2021-07-26 ENCOUNTER — Other Ambulatory Visit: Payer: Self-pay

## 2021-07-26 ENCOUNTER — Ambulatory Visit
Admission: RE | Admit: 2021-07-26 | Discharge: 2021-07-26 | Disposition: A | Discharge status: Home / Self Care | Payer: Medicare HMO | Source: Ambulatory Visit | Attending: Oncology | Admitting: Oncology

## 2021-07-26 DIAGNOSIS — C3490 Malignant neoplasm of unspecified part of unspecified bronchus or lung: Secondary | ICD-10-CM | POA: Diagnosis not present

## 2021-07-26 DIAGNOSIS — I700 Atherosclerosis of aorta: Secondary | ICD-10-CM | POA: Diagnosis not present

## 2021-07-26 DIAGNOSIS — R911 Solitary pulmonary nodule: Secondary | ICD-10-CM | POA: Diagnosis not present

## 2021-07-26 DIAGNOSIS — C3492 Malignant neoplasm of unspecified part of left bronchus or lung: Secondary | ICD-10-CM | POA: Insufficient documentation

## 2021-07-26 LAB — POCT I-STAT CREATININE: Creatinine, Ser: 0.9 mg/dL (ref 0.44–1.00)

## 2021-07-26 MED ORDER — IOHEXOL 350 MG/ML SOLN
75.0000 mL | Freq: Once | INTRAVENOUS | Status: AC | PRN
  Administered 2021-07-26: 75 mL via INTRAVENOUS

## 2021-07-28 ENCOUNTER — Telehealth: Payer: Self-pay | Admitting: Oncology

## 2021-07-28 NOTE — Telephone Encounter (Signed)
Patient CT was moved up per Dr. Ginette Pitman request to 10/12. Dr. Grayland Ormond was aware of the change from November and the patient is now wanting to know do we need to see her before her 11/22 appt and does she need to also make a appt with Dr. Baruch Gouty. Please advise.

## 2021-07-28 NOTE — Telephone Encounter (Signed)
Will need to wait for MD to return to the office to review CT and decide if needs to be seen sooner

## 2021-07-31 NOTE — Telephone Encounter (Signed)
Patient informed no change in 08/2021 appt

## 2021-08-03 ENCOUNTER — Other Ambulatory Visit: Payer: Self-pay | Admitting: Internal Medicine

## 2021-08-03 DIAGNOSIS — Z1231 Encounter for screening mammogram for malignant neoplasm of breast: Secondary | ICD-10-CM

## 2021-08-09 NOTE — Progress Notes (Deleted)
BH MD/PA/NP OP Progress Note  08/09/2021 3:28 PM Suzanne Glenn  MRN:  [id number]  Chief Complaint:  HPI: *** Visit Diagnosis: No diagnosis found.  Past Psychiatric History: Please see initial evaluation for full details. I have reviewed the history. No updates at this time.     Past Medical History:  Past Medical History:  Diagnosis Date   Anxiety    Asthma    Cancer ([place]) 12/2018   w/u for right upper lobe mass/cancer   COPD (chronic obstructive pulmonary disease) (HCC)    also, emphysema. now using o2 via np 24 hours a day   Coronary artery disease    Depression    Diabetes mellitus, type II (HCC)    GERD (gastroesophageal reflux disease)    Headache    migraines in early 20's   Heart murmur    Hypertension    Lung cancer ([place])    Neuropathy    Restless leg     Past Surgical History:  Procedure Laterality Date   BACK SURGERY  2005   surgery x 2, disc fused in neck, pinched nerve in center of back and neck   CARDIAC CATHETERIZATION  2015   1 stent placed for blockage   cervical bone infusion     CHOLECYSTECTOMY     THORACIC DISC SURGERY     TUBAL LIGATION     VIDEO BRONCHOSCOPY WITH ENDOBRONCHIAL ULTRASOUND N/A 03/06/2019   Procedure: VIDEO BRONCHOSCOPY WITH ENDOBRONCHIAL ULTRASOUND - DIABETIC;  Surgeon: Ottie Glazier, MD;  Location: [facility] ORS;  Service: Thoracic;  Laterality: N/A;    Family Psychiatric History: Please see initial evaluation for full details. I have reviewed the history. No updates at this time.     Family History:  Family History  Problem Relation Age of Onset   Anxiety disorder Mother    Cancer - Lung Mother    Depression Sister    Cancer Sister    Cancer Sister    Cancer Sister    Cancer Sister    Heart Problems Sister    Bipolar disorder Sister    Diabetes Sister    Cancer - Lung Sister    Hypertension Sister    Diabetes Sister    Hyperlipidemia Sister    COPD Sister    Depression Brother    Heart Problems Brother     Cancer Brother    Cancer Brother    Cancer Brother    Breast cancer Maternal Aunt    Depression Son     Social History:  Social History   Socioeconomic History   Marital status: Married    Spouse name: Abe People   Number of children: Not on file   Years of education: Not on file   Highest education level: Not on file  Occupational History    Comment: disabled  Tobacco Use   Smoking status: Every Day    Packs/day: 1.00    Years: 40.00    Pack years: 40.00    Types: Cigarettes    Start date: 05/05/1975   Smokeless tobacco: Never  Vaping Use   Vaping Use: Never used  Substance and Sexual Activity   Alcohol use: No    Alcohol/week: 0.0 standard drinks    Comment: socially   Drug use: No   Sexual activity: Not Currently  Other Topics Concern   Not on file  Social History Narrative   Not on file   Social Determinants of Health   Financial Resource Strain: Not on file  Food  Insecurity: Not on file  Transportation Needs: Not on file  Physical Activity: Not on file  Stress: Not on file  Social Connections: Not on file    Allergies:  Allergies  Allergen Reactions   Diclofenac-Misoprostol Other (See Comments)    Arthrotec - gastritis    Nsaids Other (See Comments)    gastritis   Zolpidem Other (See Comments)    Sleep walking    Hydrocodone-Acetaminophen Nausea And Vomiting   Simvastatin Other (See Comments)    body aches    Metabolic Disorder Labs: Lab Results  Component Value Date   HGBA1C 6.4 (H) 01/02/2019   MPG 136.98 01/02/2019   No results found for: PROLACTIN No results found for: CHOL, TRIG, HDL, CHOLHDL, VLDL, LDLCALC No results found for: TSH  Therapeutic Level Labs: No results found for: LITHIUM No results found for: VALPROATE No components found for:  CBMZ  Current Medications: Current Outpatient Medications  Medication Sig Dispense Refill   ACCU-CHEK FASTCLIX LANCETS MISC      ACCU-CHEK SMARTVIEW test strip      albuterol (VENTOLIN HFA)  108 (90 Base) MCG/ACT inhaler Inhale 2 puffs into the lungs every 4 (four) hours as needed for wheezing or shortness of breath.      amLODipine (NORVASC) 10 MG tablet Take 10 mg by mouth daily.      carbidopa-levodopa (SINEMET IR) 25-100 MG tablet Take 1 tablet by mouth 3 (three) times daily.     cyanocobalamin 1000 MCG tablet Take 1 tablet by mouth daily.     esomeprazole (NEXIUM) 40 MG capsule Take 40 mg by mouth daily.     eszopiclone 3 MG TABS Take 1 tablet (3 mg total) by mouth at bedtime as needed. Take immediately before bedtime 30 tablet 2   ferrous sulfate 325 (65 FE) MG tablet Take 1 tablet (325 mg total) by mouth 2 (two) times daily with a meal. 30 tablet 3   Fluticasone-Umeclidin-Vilant 100-62.5-25 MCG/INH AEPB Inhale 1 puff into the lungs daily. Treligy     gabapentin (NEURONTIN) 400 MG capsule Take 1 capsule (400 mg total) by mouth 3 (three) times daily. 270 capsule 0   ipratropium-albuterol (DUONEB) 0.5-2.5 (3) MG/3ML SOLN Take 3 mLs by nebulization every 6 (six) hours. 360 mL 0   metFORMIN (GLUCOPHAGE) 500 MG tablet Take 500 mg by mouth daily with breakfast.      metoprolol tartrate (LOPRESSOR) 25 MG tablet Take 25 mg by mouth 2 (two) times daily.      Multiple Vitamin (MULTIVITAMIN) tablet Take 1 tablet by mouth daily.     nicotine (NICODERM CQ - DOSED IN MG/24 HOURS) 21 mg/24hr patch Place 1 patch (21 mg total) onto the skin daily. 28 patch 0   OXYGEN Inhale 2 L into the lungs continuous.     pravastatin (PRAVACHOL) 80 MG tablet Take 80 mg by mouth every evening.      spironolactone (ALDACTONE) 25 MG tablet Take 25 mg by mouth daily.      venlafaxine XR (EFFEXOR-XR) 150 MG 24 hr capsule Take total of 225 mg daily, take along with 75 mg cap 90 capsule 0   venlafaxine XR (EFFEXOR-XR) 75 MG 24 hr capsule Take total of 225 mg daily, take along with 150 mg cap 90 capsule 0   No current facility-administered medications for this visit.     Musculoskeletal: Strength & Muscle Tone:   N/A Gait & Station:  N/A Patient leans: N/A  Psychiatric Specialty Exam: Review of Systems  There were  no vitals taken for this visit.There is no height or weight on file to calculate BMI.  General Appearance: {Appearance:22683}  Eye Contact:  {BHH EYE CONTACT:22684}  Speech:  Clear and Coherent  Volume:  Normal  Mood:  {BHH MOOD:22306}  Affect:  {Affect (PAA):22687}  Thought Process:  Coherent  Orientation:  Full (Time, Place, and Person)  Thought Content: Logical   Suicidal Thoughts:  {ST/HT (PAA):22692}  Homicidal Thoughts:  {ST/HT (PAA):22692}  Memory:  Immediate;   Good  Judgement:  {Judgement (PAA):22694}  Insight:  {Insight (PAA):22695}  Psychomotor Activity:  Normal  Concentration:  Concentration: Good and Attention Span: Good  Recall:  Good  Fund of Knowledge: Good  Language: Good  Akathisia:  No  Handed:  Right  AIMS (if indicated): not done  Assets:  Communication Skills Desire for Improvement  ADL's:  Intact  Cognition: WNL  Sleep:  {BHH GOOD/FAIR/POOR:22877}   Screenings: PHQ2-9    Flowsheet Row Video Visit from 05/31/2021 in [place] Video Visit from 12/22/2020 in [facility] Patient Outreach Telephone from 02/28/2017 in [place]  PHQ-2 Total Score 0 0 1      Flowsheet Row Video Visit from 05/31/2021 in [place] ED to Hosp-Admission (Discharged) from 04/14/2021 in [place] [facility] ED to Hosp-Admission (Discharged) from 03/19/2021 in [place] No Risk No Risk No Risk        Assessment and Plan:  Suzanne Glenn is a 70 y.o. 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    1. MDD (major depressive disorder), recurrent, in partial remission ([place]) 2. GAD (generalized anxiety disorder) Although she was experiencing anxiety since the last visit with the provider, it has been improving since venlafaxine was uptitrated by her PCP.  Psychosocial stressors includes medical condition of COPD on oxygen mask, loss of her daughter from diabetes several years ago, and conflict with one of her sisters.  She has good relationship with her husband and her son, who visits her every day. Will continue current dose of venlafaxine to target depression and anxiety.    3. Restless leg syndrome She had adverse reaction of gabapentin; facial numbness when this medication was uptitrated.  Will continue current dose of gabapentin to target restless leg, and an anxiety.  Will recheck ferritin as it has not been checked at least for a few years according to the chart review.  Noted that she is also on Sinemet, prescribed by her PCP.    # Insomnia She reports insomnia due to restless leg.  She has occasional benefit from eszopiclone.  Will continue current dose to target insomnia.  Noted that she has snoring as well; she is advised to discuss with her pulmonologist whether she will be a good candidate for sleep study.      Plan Continue venlafaxine 225 mg daily Continue gabapentin 400 mg three times a day (facial numbness from 600 mg TID) Continue eszopiclone 3 mg at night as needed for sleep Check Ferritin Next appointment- 10/31 at 9:30, in person - on metoprolol       The patient demonstrates the following risk factors for suicide: Chronic risk factors  for suicide include: psychiatric disorder of depression . Acute risk factors for suicide include: family or marital conflict, unemployment, and loss (financial, interpersonal, professional). Protective factors for this patient include: positive  social support. Considering these factors, the overall suicide risk at this point appears to be low. Patient is appropriate for outpatient follow up.         Norman Clay, MD 08/09/2021, 3:28 PM

## 2021-08-14 ENCOUNTER — Ambulatory Visit: Payer: Medicare HMO | Admitting: Psychiatry

## 2021-08-18 ENCOUNTER — Other Ambulatory Visit: Payer: Self-pay

## 2021-08-18 ENCOUNTER — Emergency Department: Payer: Medicare HMO

## 2021-08-18 ENCOUNTER — Encounter: Payer: Self-pay | Admitting: Emergency Medicine

## 2021-08-18 DIAGNOSIS — Z6832 Body mass index (BMI) 32.0-32.9, adult: Secondary | ICD-10-CM | POA: Diagnosis not present

## 2021-08-18 DIAGNOSIS — E785 Hyperlipidemia, unspecified: Secondary | ICD-10-CM | POA: Diagnosis present

## 2021-08-18 DIAGNOSIS — Z888 Allergy status to other drugs, medicaments and biological substances status: Secondary | ICD-10-CM | POA: Diagnosis not present

## 2021-08-18 DIAGNOSIS — F17210 Nicotine dependence, cigarettes, uncomplicated: Secondary | ICD-10-CM | POA: Diagnosis present

## 2021-08-18 DIAGNOSIS — Z818 Family history of other mental and behavioral disorders: Secondary | ICD-10-CM | POA: Diagnosis not present

## 2021-08-18 DIAGNOSIS — E1142 Type 2 diabetes mellitus with diabetic polyneuropathy: Secondary | ICD-10-CM | POA: Diagnosis present

## 2021-08-18 DIAGNOSIS — Z8249 Family history of ischemic heart disease and other diseases of the circulatory system: Secondary | ICD-10-CM

## 2021-08-18 DIAGNOSIS — Z833 Family history of diabetes mellitus: Secondary | ICD-10-CM

## 2021-08-18 DIAGNOSIS — I2510 Atherosclerotic heart disease of native coronary artery without angina pectoris: Secondary | ICD-10-CM | POA: Diagnosis present

## 2021-08-18 DIAGNOSIS — Z7984 Long term (current) use of oral hypoglycemic drugs: Secondary | ICD-10-CM | POA: Diagnosis not present

## 2021-08-18 DIAGNOSIS — Z85118 Personal history of other malignant neoplasm of bronchus and lung: Secondary | ICD-10-CM | POA: Diagnosis not present

## 2021-08-18 DIAGNOSIS — Z825 Family history of asthma and other chronic lower respiratory diseases: Secondary | ICD-10-CM

## 2021-08-18 DIAGNOSIS — Z886 Allergy status to analgesic agent status: Secondary | ICD-10-CM | POA: Diagnosis not present

## 2021-08-18 DIAGNOSIS — R0602 Shortness of breath: Secondary | ICD-10-CM | POA: Diagnosis not present

## 2021-08-18 DIAGNOSIS — Z83438 Family history of other disorder of lipoprotein metabolism and other lipidemia: Secondary | ICD-10-CM | POA: Diagnosis not present

## 2021-08-18 DIAGNOSIS — D75839 Thrombocytosis, unspecified: Secondary | ICD-10-CM | POA: Diagnosis not present

## 2021-08-18 DIAGNOSIS — I10 Essential (primary) hypertension: Secondary | ICD-10-CM | POA: Diagnosis present

## 2021-08-18 DIAGNOSIS — K219 Gastro-esophageal reflux disease without esophagitis: Secondary | ICD-10-CM | POA: Diagnosis present

## 2021-08-18 DIAGNOSIS — J441 Chronic obstructive pulmonary disease with (acute) exacerbation: Principal | ICD-10-CM | POA: Diagnosis present

## 2021-08-18 DIAGNOSIS — Z20822 Contact with and (suspected) exposure to covid-19: Secondary | ICD-10-CM | POA: Diagnosis not present

## 2021-08-18 DIAGNOSIS — F32A Depression, unspecified: Secondary | ICD-10-CM | POA: Diagnosis present

## 2021-08-18 DIAGNOSIS — R0600 Dyspnea, unspecified: Secondary | ICD-10-CM | POA: Diagnosis not present

## 2021-08-18 DIAGNOSIS — Z79899 Other long term (current) drug therapy: Secondary | ICD-10-CM | POA: Diagnosis not present

## 2021-08-18 DIAGNOSIS — J9621 Acute and chronic respiratory failure with hypoxia: Secondary | ICD-10-CM | POA: Diagnosis present

## 2021-08-18 DIAGNOSIS — Z9981 Dependence on supplemental oxygen: Secondary | ICD-10-CM

## 2021-08-18 DIAGNOSIS — G2581 Restless legs syndrome: Secondary | ICD-10-CM | POA: Diagnosis not present

## 2021-08-18 DIAGNOSIS — E669 Obesity, unspecified: Secondary | ICD-10-CM | POA: Diagnosis not present

## 2021-08-18 DIAGNOSIS — R059 Cough, unspecified: Secondary | ICD-10-CM | POA: Diagnosis not present

## 2021-08-18 DIAGNOSIS — R0902 Hypoxemia: Secondary | ICD-10-CM | POA: Diagnosis not present

## 2021-08-18 DIAGNOSIS — Z803 Family history of malignant neoplasm of breast: Secondary | ICD-10-CM

## 2021-08-18 LAB — COMPREHENSIVE METABOLIC PANEL
ALT: 23 U/L (ref 0–44)
AST: 31 U/L (ref 15–41)
Albumin: 3.2 g/dL — ABNORMAL LOW (ref 3.5–5.0)
Alkaline Phosphatase: 96 U/L (ref 38–126)
Anion gap: 12 (ref 5–15)
BUN: 19 mg/dL (ref 8–23)
CO2: 35 mmol/L — ABNORMAL HIGH (ref 22–32)
Calcium: 9.7 mg/dL (ref 8.9–10.3)
Chloride: 91 mmol/L — ABNORMAL LOW (ref 98–111)
Creatinine, Ser: 0.7 mg/dL (ref 0.44–1.00)
GFR, Estimated: >60 mL/min (ref >60)
Glucose, Bld: 131 mg/dL — ABNORMAL HIGH (ref 70–99)
Potassium: 4.2 mmol/L (ref 3.5–5.1)
Sodium: 138 mmol/L (ref 135–145)
Total Bilirubin: 0.4 mg/dL (ref 0.3–1.2)
Total Protein: 7.7 g/dL (ref 6.5–8.1)

## 2021-08-18 LAB — CBC WITH DIFFERENTIAL/PLATELET
Abs Immature Granulocytes: 0.27 10*3/uL — ABNORMAL HIGH (ref 0.00–0.07)
Basophils Absolute: 0.1 10*3/uL (ref 0.0–0.1)
Basophils Relative: 1 %
Eosinophils Absolute: 0.1 10*3/uL (ref 0.0–0.5)
Eosinophils Relative: 1 %
HCT: 37.8 % (ref 36.0–46.0)
Hemoglobin: 11.8 g/dL — ABNORMAL LOW (ref 12.0–15.0)
Immature Granulocytes: 2 %
Lymphocytes Relative: 10 %
Lymphs Abs: 1.2 10*3/uL (ref 0.7–4.0)
MCH: 29.3 pg (ref 26.0–34.0)
MCHC: 31.2 g/dL (ref 30.0–36.0)
MCV: 93.8 fL (ref 80.0–100.0)
Monocytes Absolute: 1.1 10*3/uL — ABNORMAL HIGH (ref 0.1–1.0)
Monocytes Relative: 10 %
Neutro Abs: 9 10*3/uL — ABNORMAL HIGH (ref 1.7–7.7)
Neutrophils Relative %: 76 %
Platelets: 417 10*3/uL — ABNORMAL HIGH (ref 150–400)
RBC: 4.03 MIL/uL (ref 3.87–5.11)
RDW: 14.2 % (ref 11.5–15.5)
WBC: 11.8 10*3/uL — ABNORMAL HIGH (ref 4.0–10.5)
nRBC: 0.2 % (ref 0.0–0.2)

## 2021-08-18 LAB — TROPONIN I (HIGH SENSITIVITY): Troponin I (High Sensitivity): 16 ng/L (ref <18)

## 2021-08-18 LAB — RESP PANEL BY RT-PCR (FLU A&B, COVID) ARPGX2
Influenza A by PCR: NEGATIVE
Influenza B by PCR: NEGATIVE
SARS Coronavirus 2 by RT PCR: NEGATIVE

## 2021-08-18 LAB — PROCALCITONIN: Procalcitonin: 0.16 ng/mL

## 2021-08-18 NOTE — ED Triage Notes (Signed)
Pt here with c/o shob, night sweats, and general malaise for the past few days, wears home O2 at 2L, 89% on 3L in triage. Denies cp at this time, has been coughing and nose has been runny. Breathing labored while talking.

## 2021-08-18 NOTE — ED Provider Notes (Signed)
Emergency Medicine Provider Triage Evaluation Note  Suzanne Glenn , a 70 y.o. female  was evaluated in triage.  Pt complains of shortness of breath that has been getting worse over the last few days. She has also had night sweats and runny nose. Home O2 at 2liters not helping.  Review of Systems  Positive: Shortness of breath Negative: Chest pain  Physical Exam  There were no vitals taken for this visit. Gen:   Awake, no distress   Resp:  Normal effort  MSK:   Moves extremities without difficulty  Other:    Medical Decision Making  Medically screening exam initiated at 4:21 PM.  Appropriate orders placed.  Suzanne Glenn was informed that the remainder of the evaluation will be completed by another provider, this initial triage assessment does not replace that evaluation, and the importance of remaining in the ED until their evaluation is complete.   Victorino Dike, FNP 08/18/21 1624    Arta Silence, MD 08/18/21 1950

## 2021-08-19 ENCOUNTER — Inpatient Hospital Stay
Admission: EM | Admit: 2021-08-19 | Discharge: 2021-08-22 | DRG: 190 | Disposition: A | Discharge status: Home / Self Care | Payer: Medicare HMO | Attending: Internal Medicine | Admitting: Internal Medicine

## 2021-08-19 ENCOUNTER — Emergency Department: Payer: Medicare HMO

## 2021-08-19 ENCOUNTER — Encounter: Payer: Self-pay | Admitting: Oncology

## 2021-08-19 DIAGNOSIS — Z886 Allergy status to analgesic agent status: Secondary | ICD-10-CM | POA: Diagnosis not present

## 2021-08-19 DIAGNOSIS — D75839 Thrombocytosis, unspecified: Secondary | ICD-10-CM | POA: Diagnosis present

## 2021-08-19 DIAGNOSIS — J441 Chronic obstructive pulmonary disease with (acute) exacerbation: Secondary | ICD-10-CM | POA: Diagnosis present

## 2021-08-19 DIAGNOSIS — G2581 Restless legs syndrome: Secondary | ICD-10-CM | POA: Diagnosis present

## 2021-08-19 DIAGNOSIS — I10 Essential (primary) hypertension: Secondary | ICD-10-CM

## 2021-08-19 DIAGNOSIS — K219 Gastro-esophageal reflux disease without esophagitis: Secondary | ICD-10-CM | POA: Diagnosis present

## 2021-08-19 DIAGNOSIS — I2510 Atherosclerotic heart disease of native coronary artery without angina pectoris: Secondary | ICD-10-CM | POA: Diagnosis present

## 2021-08-19 DIAGNOSIS — Z833 Family history of diabetes mellitus: Secondary | ICD-10-CM | POA: Diagnosis not present

## 2021-08-19 DIAGNOSIS — J9620 Acute and chronic respiratory failure, unspecified whether with hypoxia or hypercapnia: Secondary | ICD-10-CM | POA: Diagnosis present

## 2021-08-19 DIAGNOSIS — Z825 Family history of asthma and other chronic lower respiratory diseases: Secondary | ICD-10-CM | POA: Diagnosis not present

## 2021-08-19 DIAGNOSIS — F32A Depression, unspecified: Secondary | ICD-10-CM | POA: Diagnosis present

## 2021-08-19 DIAGNOSIS — J9621 Acute and chronic respiratory failure with hypoxia: Secondary | ICD-10-CM | POA: Diagnosis not present

## 2021-08-19 DIAGNOSIS — R0902 Hypoxemia: Secondary | ICD-10-CM | POA: Diagnosis not present

## 2021-08-19 DIAGNOSIS — Z79899 Other long term (current) drug therapy: Secondary | ICD-10-CM | POA: Diagnosis not present

## 2021-08-19 DIAGNOSIS — Z7984 Long term (current) use of oral hypoglycemic drugs: Secondary | ICD-10-CM | POA: Diagnosis not present

## 2021-08-19 DIAGNOSIS — R0600 Dyspnea, unspecified: Secondary | ICD-10-CM | POA: Diagnosis not present

## 2021-08-19 DIAGNOSIS — Z888 Allergy status to other drugs, medicaments and biological substances status: Secondary | ICD-10-CM | POA: Diagnosis not present

## 2021-08-19 DIAGNOSIS — Z83438 Family history of other disorder of lipoprotein metabolism and other lipidemia: Secondary | ICD-10-CM | POA: Diagnosis not present

## 2021-08-19 DIAGNOSIS — Z6832 Body mass index (BMI) 32.0-32.9, adult: Secondary | ICD-10-CM | POA: Diagnosis not present

## 2021-08-19 DIAGNOSIS — E785 Hyperlipidemia, unspecified: Secondary | ICD-10-CM | POA: Diagnosis present

## 2021-08-19 DIAGNOSIS — Z85118 Personal history of other malignant neoplasm of bronchus and lung: Secondary | ICD-10-CM | POA: Diagnosis not present

## 2021-08-19 DIAGNOSIS — E669 Obesity, unspecified: Secondary | ICD-10-CM | POA: Diagnosis present

## 2021-08-19 DIAGNOSIS — Z8249 Family history of ischemic heart disease and other diseases of the circulatory system: Secondary | ICD-10-CM | POA: Diagnosis not present

## 2021-08-19 DIAGNOSIS — Z818 Family history of other mental and behavioral disorders: Secondary | ICD-10-CM | POA: Diagnosis not present

## 2021-08-19 DIAGNOSIS — F17210 Nicotine dependence, cigarettes, uncomplicated: Secondary | ICD-10-CM | POA: Diagnosis present

## 2021-08-19 DIAGNOSIS — Z20822 Contact with and (suspected) exposure to covid-19: Secondary | ICD-10-CM | POA: Diagnosis present

## 2021-08-19 DIAGNOSIS — E1142 Type 2 diabetes mellitus with diabetic polyneuropathy: Secondary | ICD-10-CM | POA: Diagnosis present

## 2021-08-19 LAB — URINALYSIS, ROUTINE W REFLEX MICROSCOPIC
Bilirubin Urine: NEGATIVE
Glucose, UA: NEGATIVE mg/dL
Hgb urine dipstick: NEGATIVE
Ketones, ur: 5 mg/dL — AB
Leukocytes,Ua: NEGATIVE
Nitrite: NEGATIVE
Protein, ur: NEGATIVE mg/dL
Specific Gravity, Urine: >1.046 — ABNORMAL HIGH (ref 1.005–1.030)
pH: 5 (ref 5.0–8.0)

## 2021-08-19 LAB — EXPECTORATED SPUTUM ASSESSMENT W GRAM STAIN, RFLX TO RESP C

## 2021-08-19 LAB — TROPONIN I (HIGH SENSITIVITY): Troponin I (High Sensitivity): 14 ng/L (ref <18)

## 2021-08-19 MED ORDER — IPRATROPIUM-ALBUTEROL 0.5-2.5 (3) MG/3ML IN SOLN
3.0000 mL | Freq: Once | RESPIRATORY_TRACT | Status: AC
  Administered 2021-08-19: 3 mL via RESPIRATORY_TRACT
  Filled 2021-08-19: qty 3

## 2021-08-19 MED ORDER — AMOXICILLIN-POT CLAVULANATE 875-125 MG PO TABS
1.0000 | ORAL_TABLET | Freq: Once | ORAL | Status: AC
  Administered 2021-08-19: 1 via ORAL
  Filled 2021-08-19: qty 1

## 2021-08-19 MED ORDER — ZOLPIDEM TARTRATE 5 MG PO TABS: 5.0000 mg | ORAL_TABLET | Freq: Every evening | ORAL | Status: DC | PRN

## 2021-08-19 MED ORDER — ONDANSETRON HCL 4 MG PO TABS
4.0000 mg | ORAL_TABLET | Freq: Four times a day (QID) | ORAL | Status: DC | PRN
  Administered 2021-08-22: 4 mg via ORAL
  Filled 2021-08-19: qty 1

## 2021-08-19 MED ORDER — VITAMIN B-12 1000 MCG PO TABS
1000.0000 ug | ORAL_TABLET | Freq: Every day | ORAL | Status: DC
  Administered 2021-08-19 – 2021-08-22 (×4): 1000 ug via ORAL
  Filled 2021-08-19 (×5): qty 1

## 2021-08-19 MED ORDER — METOPROLOL TARTRATE 25 MG PO TABS
25.0000 mg | ORAL_TABLET | Freq: Two times a day (BID) | ORAL | Status: DC
  Administered 2021-08-19 – 2021-08-22 (×7): 25 mg via ORAL
  Filled 2021-08-19 (×7): qty 1

## 2021-08-19 MED ORDER — NICOTINE 21 MG/24HR TD PT24
21.0000 mg | MEDICATED_PATCH | Freq: Every day | TRANSDERMAL | Status: DC
  Administered 2021-08-19 – 2021-08-22 (×4): 21 mg via TRANSDERMAL
  Filled 2021-08-19 (×4): qty 1

## 2021-08-19 MED ORDER — ADULT MULTIVITAMIN W/MINERALS CH
1.0000 | ORAL_TABLET | Freq: Every day | ORAL | Status: DC
  Administered 2021-08-19 – 2021-08-22 (×4): 1 via ORAL
  Filled 2021-08-19 (×4): qty 1

## 2021-08-19 MED ORDER — IOHEXOL 350 MG/ML SOLN
75.0000 mL | Freq: Once | INTRAVENOUS | Status: AC | PRN
  Administered 2021-08-19: 75 mL via INTRAVENOUS

## 2021-08-19 MED ORDER — SODIUM CHLORIDE 0.9 % IV SOLN: INTRAVENOUS | Status: DC

## 2021-08-19 MED ORDER — AMLODIPINE BESYLATE 10 MG PO TABS
10.0000 mg | ORAL_TABLET | Freq: Every day | ORAL | Status: DC
  Administered 2021-08-19 – 2021-08-22 (×4): 10 mg via ORAL
  Filled 2021-08-19 (×2): qty 1
  Filled 2021-08-19: qty 2
  Filled 2021-08-19: qty 1

## 2021-08-19 MED ORDER — MAGNESIUM HYDROXIDE 400 MG/5ML PO SUSP: 30.0000 mL | Freq: Every day | ORAL | Status: DC | PRN

## 2021-08-19 MED ORDER — ONDANSETRON HCL 4 MG/2ML IJ SOLN
4.0000 mg | Freq: Once | INTRAMUSCULAR | Status: AC
  Administered 2021-08-19: 4 mg via INTRAVENOUS
  Filled 2021-08-19: qty 2

## 2021-08-19 MED ORDER — AZITHROMYCIN 250 MG PO TABS
500.0000 mg | ORAL_TABLET | Freq: Every day | ORAL | Status: DC
  Administered 2021-08-19 – 2021-08-22 (×4): 500 mg via ORAL
  Filled 2021-08-19 (×3): qty 2
  Filled 2021-08-19: qty 1

## 2021-08-19 MED ORDER — SODIUM CHLORIDE 0.9 % IV SOLN
1.0000 g | INTRAVENOUS | Status: DC
  Administered 2021-08-19 – 2021-08-22 (×4): 1 g via INTRAVENOUS
  Filled 2021-08-19 (×3): qty 1
  Filled 2021-08-19 (×2): qty 10

## 2021-08-19 MED ORDER — METHYLPREDNISOLONE SODIUM SUCC 40 MG IJ SOLR
40.0000 mg | Freq: Two times a day (BID) | INTRAMUSCULAR | Status: AC
  Administered 2021-08-19: 40 mg via INTRAVENOUS
  Filled 2021-08-19: qty 1

## 2021-08-19 MED ORDER — CARBIDOPA-LEVODOPA 25-100 MG PO TABS
1.0000 | ORAL_TABLET | Freq: Three times a day (TID) | ORAL | Status: DC
  Administered 2021-08-19 – 2021-08-22 (×9): 1 via ORAL
  Filled 2021-08-19 (×12): qty 1

## 2021-08-19 MED ORDER — METHYLPREDNISOLONE SODIUM SUCC 125 MG IJ SOLR
125.0000 mg | Freq: Once | INTRAMUSCULAR | Status: AC
  Administered 2021-08-19: 125 mg via INTRAVENOUS
  Filled 2021-08-19: qty 2

## 2021-08-19 MED ORDER — MAGNESIUM SULFATE 2 GM/50ML IV SOLN
2.0000 g | Freq: Once | INTRAVENOUS | Status: AC
  Administered 2021-08-19: 2 g via INTRAVENOUS
  Filled 2021-08-19: qty 50

## 2021-08-19 MED ORDER — ACETAMINOPHEN 650 MG RE SUPP
650.0000 mg | Freq: Four times a day (QID) | RECTAL | Status: DC | PRN
  Filled 2021-08-19: qty 1

## 2021-08-19 MED ORDER — VENLAFAXINE HCL ER 150 MG PO CP24: 150.0000 mg | ORAL_CAPSULE | Freq: Every day | ORAL | Status: DC

## 2021-08-19 MED ORDER — TRAZODONE HCL 50 MG PO TABS
25.0000 mg | ORAL_TABLET | Freq: Every evening | ORAL | Status: DC | PRN
  Administered 2021-08-19 – 2021-08-20 (×2): 25 mg via ORAL
  Filled 2021-08-19 (×2): qty 1

## 2021-08-19 MED ORDER — ONDANSETRON HCL 4 MG/2ML IJ SOLN: 4.0000 mg | Freq: Four times a day (QID) | INTRAMUSCULAR | Status: DC | PRN

## 2021-08-19 MED ORDER — ACETAMINOPHEN 325 MG PO TABS: 650.0000 mg | ORAL_TABLET | Freq: Four times a day (QID) | ORAL | Status: DC | PRN

## 2021-08-19 MED ORDER — PREDNISONE 20 MG PO TABS
40.0000 mg | ORAL_TABLET | Freq: Every day | ORAL | Status: DC
  Administered 2021-08-20 – 2021-08-22 (×3): 40 mg via ORAL
  Filled 2021-08-19 (×3): qty 2

## 2021-08-19 MED ORDER — GABAPENTIN 400 MG PO CAPS
400.0000 mg | ORAL_CAPSULE | Freq: Three times a day (TID) | ORAL | Status: DC
  Administered 2021-08-19 – 2021-08-22 (×10): 400 mg via ORAL
  Filled 2021-08-19 (×10): qty 1

## 2021-08-19 MED ORDER — IPRATROPIUM-ALBUTEROL 0.5-2.5 (3) MG/3ML IN SOLN
3.0000 mL | Freq: Four times a day (QID) | RESPIRATORY_TRACT | Status: DC
  Administered 2021-08-19 – 2021-08-20 (×6): 3 mL via RESPIRATORY_TRACT
  Filled 2021-08-19 (×7): qty 3

## 2021-08-19 MED ORDER — PANTOPRAZOLE SODIUM 40 MG PO TBEC
40.0000 mg | DELAYED_RELEASE_TABLET | Freq: Every day | ORAL | Status: DC
  Administered 2021-08-19 – 2021-08-22 (×4): 40 mg via ORAL
  Filled 2021-08-19 (×4): qty 1

## 2021-08-19 MED ORDER — VENLAFAXINE HCL ER 75 MG PO CP24
225.0000 mg | ORAL_CAPSULE | Freq: Every day | ORAL | Status: DC
  Administered 2021-08-19 – 2021-08-22 (×4): 225 mg via ORAL
  Filled 2021-08-19 (×4): qty 3
  Filled 2021-08-19: qty 1

## 2021-08-19 MED ORDER — PRAVASTATIN SODIUM 20 MG PO TABS
80.0000 mg | ORAL_TABLET | Freq: Every evening | ORAL | Status: DC
  Administered 2021-08-19 – 2021-08-21 (×3): 80 mg via ORAL
  Filled 2021-08-19 (×3): qty 4

## 2021-08-19 MED ORDER — SPIRONOLACTONE 25 MG PO TABS
25.0000 mg | ORAL_TABLET | Freq: Every day | ORAL | Status: DC
  Administered 2021-08-19 – 2021-08-22 (×4): 25 mg via ORAL
  Filled 2021-08-19 (×4): qty 1

## 2021-08-19 MED ORDER — FERROUS SULFATE 325 (65 FE) MG PO TABS
325.0000 mg | ORAL_TABLET | Freq: Two times a day (BID) | ORAL | Status: DC
  Administered 2021-08-19 – 2021-08-22 (×7): 325 mg via ORAL
  Filled 2021-08-19 (×7): qty 1

## 2021-08-19 MED ORDER — ENOXAPARIN SODIUM 40 MG/0.4ML IJ SOSY
0.5000 mg/kg | PREFILLED_SYRINGE | INTRAMUSCULAR | Status: DC
  Administered 2021-08-19: 42.5 mg via SUBCUTANEOUS
  Filled 2021-08-19 (×2): qty 0.8

## 2021-08-19 NOTE — ED Notes (Signed)
Pt resting on stretcher with lights off to enhance rest. No acute distress noted and no complaints reported at this time. Iv fluids infusing without difficulty. Lights off to enhance rest. Provided for safety and comfort and will continue to assess.

## 2021-08-19 NOTE — ED Provider Notes (Signed)
[facility] Emergency Department Provider Note  ____________________________________________  Time seen: Approximately 5:03 AM  I have reviewed the triage vital signs and the nursing notes.   HISTORY  Chief Complaint Shortness of Breath   HPI Suzanne Glenn is a 70 y.o. female with a history of COPD, asthma, lung cancer, diabetes, hypertension who presents for evaluation of shortness of breath.  Patient reports 4 days of chills, sore throat, night sweats, malaise, fever, progressively worsening shortness of breath, cough productive of green sputum and congestion.  No vomiting or diarrhea.  No chest pain.  Patient is on chronic 2 L at home but has had to increase her oxygen over the last couple of days.  She was hypoxic to 89% in triage on 2 L.  She denies any prior history of PE or DVT, leg pain or swelling, hemoptysis, or exogenous hormones.  She reports that her shortness of breath this evening became severe and constant.  Worse with minimal ambulation.   Past Medical History:  Diagnosis Date   Anxiety    Asthma    Cancer ([place]) 12/2018   w/u for right upper lobe mass/cancer   COPD (chronic obstructive pulmonary disease) (HCC)    also, emphysema. now using o2 via np 24 hours a day   Coronary artery disease    Depression    Diabetes mellitus, type II ([place])    GERD (gastroesophageal reflux disease)    Headache    migraines in early 20's   Heart murmur    Hypertension    Lung cancer ([place])    Neuropathy    Restless leg     Patient Active Problem List   Diagnosis Date Noted   Acute on chronic respiratory failure ([place]) 08/19/2021   COPD exacerbation ([place]) 04/14/2021   HLD (hyperlipidemia) 04/14/2021   Diabetes mellitus without complication ([place]) 30/06/2329   Depression 04/14/2021   CAD (coronary artery disease) 04/14/2021   Iron deficiency anemia 04/14/2021   Sepsis ([place]) 04/14/2021   RLS (restless legs syndrome) 04/14/2021   Acute on chronic  respiratory failure with hypoxia ([place]) 03/20/2021   Atypical pneumonia 03/19/2021   MDD (major depressive disorder), recurrent, in full remission ([place]) 12/11/2019   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   Goals of care, counseling/discussion 07/26/2019   Major depressive disorder, recurrent, in partial remission ([place]) 03/27/2019   Chronic respiratory failure with hypoxia ([place]) 03/27/2019   Urge incontinence of urine 03/27/2019   Tobacco use 01/13/2019   Mass of upper lobe of left lung 01/13/2019   Symptomatic anemia 01/01/2019   Restless leg syndrome 06/30/2018   Pure hypercholesterolemia 09/26/2017   Sepsis due to pneumonia ([place]) 02/24/2017   Bilateral carotid artery stenosis 03/30/2016   Syncope and collapse 03/30/2016   Essential (primary) hypertension 08/29/2015   Controlled type 2 diabetes mellitus without complication ([place]) 07/62/2633   Combined fat and carbohydrate induced hyperlipemia 06/30/2015   Clinical depression 05/05/2015   GAD (generalized anxiety disorder) 05/05/2015   Depression, major, recurrent, moderate ([place]) 05/05/2015   Benign essential HTN 04/05/2015   Diabetes mellitus, type 2 ([place]) 02/09/2015   Type 2 diabetes mellitus ([place]) 02/09/2015   Cannot sleep 01/24/2015   Fibromyalgia 01/24/2015   CAFL (chronic airflow limitation) ([place]) 01/24/2015   H/O diabetes mellitus 01/24/2015   H/O hypercholesterolemia 01/24/2015   H/O: HTN (hypertension) 01/24/2015   Coronary artery disease 01/24/2015   Hypokalemia 01/24/2015   Arteriosclerosis of coronary artery 10/04/2014   Chest pain 10/04/2014  3-vessel CAD 08/02/2014   Acute chest pain 06/25/2014   SOB (shortness of breath) 06/25/2014   COPD, moderate (HCC) 06/08/2014   Moderate COPD (chronic obstructive pulmonary disease) ([place]) 06/08/2014   Gastro-esophageal reflux disease without esophagitis 05/31/2014    Past Surgical History:  Procedure Laterality Date   BACK SURGERY  2005   surgery x 2,  disc fused in neck, pinched nerve in center of back and neck   CARDIAC CATHETERIZATION  2015   1 stent placed for blockage   cervical bone infusion     CHOLECYSTECTOMY     THORACIC DISC SURGERY     TUBAL LIGATION     VIDEO BRONCHOSCOPY WITH ENDOBRONCHIAL ULTRASOUND N/A 03/06/2019   Procedure: VIDEO BRONCHOSCOPY WITH ENDOBRONCHIAL ULTRASOUND - DIABETIC;  Surgeon: Ottie Glazier, MD;  Location: [facility] ORS;  Service: Thoracic;  Laterality: N/A;    Prior to Admission medications   Medication Sig Start Date End Date Taking? Authorizing Provider  ACCU-CHEK FASTCLIX LANCETS [place]  02/18/15   [provider]  ACCU-CHEK SMARTVIEW test strip  02/18/15   [provider]  albuterol (VENTOLIN HFA) 108 (90 Base) MCG/ACT inhaler Inhale 2 puffs into the lungs every 4 (four) hours as needed for wheezing or shortness of breath.  03/22/14   [provider]  amLODipine (NORVASC) 10 MG tablet Take 10 mg by mouth daily.     [provider]  carbidopa-levodopa (SINEMET IR) 25-100 MG tablet Take 1 tablet by mouth 3 (three) times daily.    [provider]  cyanocobalamin 1000 MCG tablet Take 1 tablet by mouth daily.    [provider]  esomeprazole (NEXIUM) 40 MG capsule Take 40 mg by mouth daily.    [provider]  eszopiclone 3 MG TABS Take 1 tablet (3 mg total) by mouth at bedtime as needed. Take immediately before bedtime 05/31/21 05/31/22  Norman Clay, MD  ferrous sulfate 325 (65 FE) MG tablet Take 1 tablet (325 mg total) by mouth 2 (two) times daily with a meal. 01/02/19   Vaughan Basta, MD  Fluticasone-Umeclidin-Vilant 100-62.5-25 MCG/INH AEPB Inhale 1 puff into the lungs daily. [place] 11/28/18   [provider]  gabapentin (NEURONTIN) 400 MG capsule Take 1 capsule (400 mg total) by mouth 3 (three) times daily. 05/31/21 08/29/21  Norman Clay, MD  ipratropium-albuterol (DUONEB) 0.5-2.5 (3) MG/3ML SOLN Take 3 mLs by nebulization every 6  (six) hours. 04/17/21   Sharen Hones, MD  metFORMIN (GLUCOPHAGE) 500 MG tablet Take 500 mg by mouth daily with breakfast.     [provider]  metoprolol tartrate (LOPRESSOR) 25 MG tablet Take 25 mg by mouth 2 (two) times daily.     [provider]  Multiple Vitamin (MULTIVITAMIN) tablet Take 1 tablet by mouth daily.    [provider]  nicotine (NICODERM CQ - DOSED IN MG/24 HOURS) 21 mg/24hr patch Place 1 patch (21 mg total) onto the skin daily. 04/18/21   Sharen Hones, MD  OXYGEN Inhale 2 L into the lungs continuous.    [provider]  pravastatin (PRAVACHOL) 80 MG tablet Take 80 mg by mouth every evening.     [provider]  spironolactone (ALDACTONE) 25 MG tablet Take 25 mg by mouth daily.     [provider]  venlafaxine XR (EFFEXOR-XR) 150 MG 24 hr capsule Take total of 225 mg daily, take along with 75 mg cap 05/31/21   Hisada, Elie Goody, MD  venlafaxine XR (EFFEXOR-XR) 75 MG 24 hr capsule Take  total of 225 mg daily, take along with 150 mg cap 05/31/21   Norman Clay, MD    Allergies Diclofenac-misoprostol, Nsaids, Zolpidem, Hydrocodone-acetaminophen, and Simvastatin  Family History  Problem Relation Age of Onset   Anxiety disorder Mother    Cancer - Lung Mother    Depression Sister    Cancer Sister    Cancer Sister    Cancer Sister    Cancer Sister    Heart Problems Sister    Bipolar disorder Sister    Diabetes Sister    Cancer - Lung Sister    Hypertension Sister    Diabetes Sister    Hyperlipidemia Sister    COPD Sister    Depression Brother    Heart Problems Brother    Cancer Brother    Cancer Brother    Cancer Brother    Breast cancer Maternal Aunt    Depression Son     Social History Social History   Tobacco Use   Smoking status: Every Day    Packs/day: 1.00    Years: 40.00    Pack years: 40.00    Types: Cigarettes    Start date: 05/05/1975   Smokeless tobacco: Never  Vaping Use   Vaping Use: Never used   Substance Use Topics   Alcohol use: No    Alcohol/week: 0.0 standard drinks    Comment: socially   Drug use: No    Review of Systems  Constitutional: + fever, chills, malaise, night sweats Eyes: Negative for visual changes. ENT: + sore throat, congestion Neck: No neck pain  Cardiovascular: Negative for chest pain. Respiratory: + shortness of breath, wheezing, cough Gastrointestinal: Negative for abdominal pain, vomiting or diarrhea. Genitourinary: Negative for dysuria. Musculoskeletal: Negative for back pain. Skin: Negative for rash. Neurological: Negative for headaches, weakness or numbness. Psych: No SI or HI  ____________________________________________   PHYSICAL EXAM:  VITAL SIGNS: ED Triage Vitals  Enc Vitals Group     BP 08/18/21 1621 109/70     Pulse Rate 08/18/21 1621 89     Resp 08/18/21 1621 (!) 24     Temp 08/18/21 1621 98.6 F (37 C)     Temp Source 08/18/21 1621 Oral     SpO2 08/18/21 1621 (!) 89 %     Weight 08/18/21 1622 187 lb (84.8 kg)     Height 08/18/21 1622 5\' 4"  (1.626 m)     Head Circumference --      Peak Flow --      Pain Score 08/18/21 1622 0     Pain Loc --      Pain Edu? --      Excl. in [place]? --     Constitutional: Alert and oriented, mild respiratory distress.  HEENT:      Head: Normocephalic and atraumatic.         Eyes: Conjunctivae are normal. Sclera is non-icteric.       Mouth/Throat: Mucous membranes are moist.       Neck: Supple with no signs of meningismus. Cardiovascular: Regular rate and rhythm. No murmurs, gallops, or rubs. 2+ symmetrical distal pulses are present in all extremities. No JVD. Respiratory: Increased work of breathing, tachypneic, hypoxic on 2 L, requiring 4 L nasal cannula, diffuse wheezing bilaterally  gastrointestinal: Soft, non tender, and non distended with positive bowel sounds. No rebound or guarding. Genitourinary: No CVA tenderness. Musculoskeletal:  No edema, cyanosis, or erythema of  extremities. Neurologic: Normal speech and language. Face is symmetric. Moving all extremities. No  gross focal neurologic deficits are appreciated. Skin: Skin is warm, dry and intact. No rash noted. Psychiatric: Mood and affect are normal. Speech and behavior are normal.  ____________________________________________   LABS (all labs ordered are listed, but only abnormal results are displayed)  Labs Reviewed  COMPREHENSIVE METABOLIC PANEL - Abnormal; Notable for the following components:      Result Value   Chloride 91 (*)    CO2 35 (*)    Glucose, Bld 131 (*)    Albumin 3.2 (*)    All other components within normal limits  CBC WITH DIFFERENTIAL/PLATELET - Abnormal; Notable for the following components:   WBC 11.8 (*)    Hemoglobin 11.8 (*)    Platelets 417 (*)    Neutro Abs 9.0 (*)    Monocytes Absolute 1.1 (*)    Abs Immature Granulocytes 0.27 (*)    All other components within normal limits  RESP PANEL BY RT-PCR (FLU A&B, COVID) ARPGX2  EXPECTORATED SPUTUM ASSESSMENT W GRAM STAIN, RFLX TO RESP C  PROCALCITONIN  URINALYSIS, ROUTINE W REFLEX MICROSCOPIC  TROPONIN I (HIGH SENSITIVITY)  TROPONIN I (HIGH SENSITIVITY)   ____________________________________________  EKG  ED ECG REPORT I, Rudene Re, the attending physician, personally viewed and interpreted this ECG.  Sinus tachycardia with frequent PVCs, rate of 107, no ST elevations ____________________________________________  RADIOLOGY  I have personally reviewed the images performed during this visit and I agree with the Radiologist's read.   Interpretation by Radiologist:  DG Chest 2 View  Result Date: 08/18/2021 CLINICAL DATA:  Productive cough and shortness of breath. EXAM: CHEST - 2 VIEW COMPARISON:  April 14, 2021 FINDINGS: Dominant stable mild to moderate severity diffuse chronic appearing increased interstitial lung markings are seen. Mild, stable areas of atelectasis and/or scarring are seen within the  mid left lung and left lung base. There is no evidence of a pleural effusion or pneumothorax. The heart size and mediastinal contours are within normal limits. A radiopaque fusion plate and screws are seen overlying the lower cervical spine. Degenerative changes are noted throughout the thoracic spine. IMPRESSION: Chronic appearing increased interstitial lung markings with mild, stable mid left lung and left basilar scarring and/or atelectasis. Electronically Signed   By: Virgina Norfolk M.D.   On: 08/18/2021 17:14   CT Angio Chest PE W and/or Wo Contrast  Result Date: 08/19/2021 CLINICAL DATA:  Dyspnea, night sweats, malaise, hypoxia. PE suspected, high prob. 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 EXAM: CT ANGIOGRAPHY CHEST WITH CONTRAST TECHNIQUE: Multidetector CT imaging of the chest was performed using the standard protocol during bolus administration of intravenous contrast. Multiplanar CT image reconstructions and MIPs were obtained to evaluate the vascular anatomy. CONTRAST:  58mL OMNIPAQUE IOHEXOL 350 MG/ML SOLN COMPARISON:  07/26/2021 chest CT. FINDINGS: Cardiovascular: The study is high quality for the evaluation of pulmonary embolism. There are no filling defects in the central, lobar, segmental or subsegmental pulmonary artery branches to suggest acute pulmonary embolism. Atherosclerotic nonaneurysmal thoracic aorta. Normal caliber pulmonary arteries. Normal heart size. Stable trace pericardial effusion/thickening. Three-vessel coronary atherosclerosis. Mediastinum/Nodes: No discrete thyroid nodules. Unremarkable esophagus. No axillary adenopathy. Newly enlarged 1.5 cm subcarinal lymph node (series 5/image 42). No hilar adenopathy. Lungs/Pleura: No pneumothorax. No pleural effusion. Mild centrilobular emphysema. Mild to moderate patchy tree-in-bud opacities at the left greater than right lung bases with scattered mucoid impaction,  new/increased. Chronic sharply marginated consolidation, distortion and volume loss in the lingula is unchanged. No acute  consolidative airspace disease. No new significant pulmonary nodules. Upper abdomen: Cholecystectomy. Stable subcentimeter hypodense right liver lesion, too small to characterize. Stable 1.1 cm left adrenal nodule with density -5 HU, compatible with benign adenoma. Musculoskeletal: No aggressive appearing focal osseous lesions. Moderate thoracic spondylosis. Partially visualized surgical hardware from ACDF. Review of the MIP images confirms the above findings. IMPRESSION: 1. No pulmonary embolism. 2. Mild to moderate patchy tree-in-bud opacities at the left greater than right lung bases with scattered mucoid impaction, new/increased, compatible with nonspecific mild infectious or inflammatory bronchiolitis, with the differential including aspiration. 3. Stable chronic sharply marginated consolidation, distortion and volume loss in the lingula, compatible with chronic post treatment change. 4. New mild subcarinal lymphadenopathy, nonspecific. Attention on follow-up chest CT with IV contrast recommended in 3 months. 5. Three-vessel coronary atherosclerosis. 6. Stable left adrenal adenoma. 7. Aortic Atherosclerosis (ICD10-I70.0) and Emphysema (ICD10-J43.9). Electronically Signed   By: Ilona Sorrel M.D.   On: 08/19/2021 06:34     ____________________________________________   PROCEDURES  Procedure(s) performed:yes .1-3 Lead EKG Interpretation Performed by: Rudene Re, MD Authorized by: Rudene Re, MD     Interpretation: abnormal     ECG rate assessment: normal     Rhythm: sinus rhythm     Ectopy: PVCs     Conduction: normal     Critical Care performed: yes  CRITICAL CARE Performed by: Rudene Re  ?  Total critical care time: 30 min  Critical care time was exclusive of separately billable procedures and treating other patients.  Critical care was  necessary to treat or prevent imminent or life-threatening deterioration.  Critical care was time spent personally by me on the following activities: development of treatment plan with patient and/or surrogate as well as nursing, discussions with consultants, evaluation of patient's response to treatment, examination of patient, obtaining history from patient or surrogate, ordering and performing treatments and interventions, ordering and review of laboratory studies, ordering and review of radiographic studies, pulse oximetry and re-evaluation of patient's condition.  ____________________________________________   INITIAL IMPRESSION / ASSESSMENT AND PLAN / ED COURSE   70 y.o. female with a history of COPD, asthma, lung cancer, diabetes, hypertension who presents for evaluation of shortness of breath, wheezing, cough productive of green sputum, malaise, fever, chills, sore throat, congestion.  Patient was wheezing significantly and hypoxic requiring 4 L nasal cannula.  She received 3 DuoNeb's and Solu-Medrol.  She is currently on 5 L nasal cannula.  She has a very productive cough and is bringing up green sputum.  Her chest x-ray showing no acute abnormalities although patient does have a lot of scarring and increased interstitial markings therefore would be a little hard to be able to see developing pneumonia.  Her procalcitonin at this time is normal.  She does have a little bit of a white count of 11.8 with a left shift.  COVID and flu negative.  EKG with frequent PVCs.  Normal potassium.  We will give a dose of IV magnesium.  2 high-sensitivity troponins are negative with no signs of demand ischemia.  Viral illness versus early pneumonia with acute on chronic respiratory failure.  CTA pending to rule out PE in the setting of active lung cancer. Will admit to the hospitalist service.  History gathered from patient family members at bedside.  Plan discussed with both of them.  Old medical records reviewed  occluding most recent note from patient's visit with her pulmonology.  Patient placed on telemetry for close monitoring of cardiorespiratory status.  _____________________________________________ Please note:  Patient was evaluated in Emergency Department today for the symptoms described in the history of present illness. Patient was evaluated in the context of the global COVID-19 pandemic, which necessitated consideration that the patient might be at risk for infection with the SARS-CoV-2 virus that causes COVID-19. Institutional protocols and algorithms that pertain to the evaluation of patients at risk for COVID-19 are in a state of rapid change based on information released by regulatory bodies including the CDC and federal and state organizations. These policies and algorithms were followed during the patient's care in the ED.  Some ED evaluations and interventions may be delayed as a result of limited staffing during the pandemic.   [place] Controlled Substance Database was reviewed by me. ____________________________________________   FINAL CLINICAL IMPRESSION(S) / ED DIAGNOSES   Final diagnoses:  Acute on chronic respiratory failure with hypoxia (HCC)  COPD exacerbation ([place])      NEW MEDICATIONS STARTED DURING THIS VISIT:  ED Discharge Orders     None        Note:  This document was prepared using Dragon voice recognition software and may include unintentional dictation errors.    Rudene Re, MD 08/19/21 [phone]

## 2021-08-19 NOTE — ED Notes (Signed)
IVF moved from [facility] to left wrist IV placed at this time.

## 2021-08-19 NOTE — ED Notes (Signed)
Pharmacy messaged to send Sinemet (med not found in pt specific bin).

## 2021-08-19 NOTE — ED Notes (Signed)
Pt repositioned in bed.

## 2021-08-19 NOTE — Progress Notes (Signed)
No charge progress note.  Suzanne Glenn is a 70 y.o. Caucasian female with medical history significant for asthma, COPD, type diabetes mellitus, hypertension and lung cancer as well as chronic respiratory failure on home O2 at 2 L/min, who presented to the ER with acute onset of worsening dyspnea over the last 4 days with associated cough productive of green sputum and chest congestion.  She admitted to associated fever and chills as well as malaise and night sweats.  She has been having mild sore throat.  She was saturating in low 80s on 3 L of oxygen when came to ED, oxygenation improved by placing her on 4 L of oxygen.  Labs pertinent for mild leukocytosis, thrombocytosis with neutrophilia.  Influenza antigen and COVID-19 PCR came back negative.  Chest x-ray with chronic increased interstitial markings with mild stable mid left flank and left base scarring and/or atelectasis. CTA was negative for PE but did show some concern of bronchitis.  She was admitted for acute on chronic hypoxic respiratory failure secondary to COPD exacerbation.  Patient seems improving when seen today.  Oxygen requirement decreased to 3 L and she was saturating in mid to high 90s. Was complaining of mild nausea, stating that it is due to hunger as she has not received her breakfast yet.  She wants to eat.  No vomiting.  On exam there was very scattered wheeze bilaterally.  Rest of the exam was benign.  We will continue with current management and hopefully discharge home tomorrow.

## 2021-08-19 NOTE — ED Notes (Signed)
Pt on phone at this time

## 2021-08-19 NOTE — ED Notes (Signed)
After breathing treatment pt placed on 6L nasal canula to maintain oxygenation at 93%.

## 2021-08-19 NOTE — Progress Notes (Signed)
PHARMACIST - PHYSICIAN COMMUNICATION  CONCERNING:  Enoxaparin (Lovenox) for DVT Prophylaxis    RECOMMENDATION: Patient was prescribed enoxaprin 40mg  q24 hours for VTE prophylaxis.   Filed Weights   08/18/21 1622  Weight: 84.8 kg (187 lb)    Body mass index is 32.1 kg/m.  Estimated Creatinine Clearance: 69.9 mL/min (by C-G formula based on SCr of 0.7 mg/dL).   Based on [place] patient is candidate for enoxaparin 0.5mg /kg TBW SQ every 24 hours based on BMI being >30.   DESCRIPTION: Pharmacy has adjusted enoxaparin dose per [facility] policy.  Patient is now receiving enoxaparin 0.5 mg/kg every 24 hours   Renda Rolls, PharmD, [facility] 08/19/2021 5:34 AM

## 2021-08-19 NOTE — H&P (Signed)
[place]   PATIENT NAME: Suzanne Glenn    MR#:  [id number]  DATE OF BIRTH:  30-Jun-1951  DATE OF ADMISSION:  08/19/2021  PRIMARY CARE PHYSICIAN: Tracie Harrier, MD   Patient is coming from: Home  REQUESTING/REFERRING PHYSICIAN: Rudene Re, MD  CHIEF COMPLAINT:   Chief Complaint  Patient presents with  . Shortness of Breath    HISTORY OF PRESENT ILLNESS:  Suzanne Glenn is a 70 y.o. Caucasian female with medical history significant for asthma, COPD, type diabetes mellitus, hypertension and lung cancer as well as chronic respiratory failure on home O2 at 2 L/min, who presented to the ER with acute onset of worsening dyspnea over the last 4 days with associated cough productive of green sputum and chest congestion.  She admitted to associated fever and chills as well as malaise and night sweats.  She has been having mild sore throat without ear ache.  No nausea or vomiting or diarrhea or abdominal pain.  No chest pain or palpitations.  She had to increase her home O2 over the last couple of days.  She was hypoxic to 89% in triage on 2 L of O2 by nasal cannula.  No orthopnea or paroxysmal nocturnal dyspnea or worsening lower extremity edema.  No recent travels or surgeries.  She has been having dyspnea on minimal exertion.  No dysuria, oliguria or hematuria or flank pain.    ED Course: Upon presentation to the emergency room respiratory rate was 24 pulse oximetry 89% on 3 L of O2 by nasal cannula and vital signs were otherwise within normal.  Labs revealed CO2 35 and albumin 3.2 with otherwise unremarkable CMP.  High-sensitivity troponin I was 16 and later 14.  Procalcitonin was 0.16.  CBC showed leukocytosis of 11.8 and thrombocytosis with neutrophilia.  Influenza antigens and COVID-19 PCR came back negative.    EKG as reviewed by me : Showed sinus tachycardia with a rate of 107 with frequent PVCs and no acute abnormalities. Imaging: Two-view chest x-ray showed  chronic appearing increased interstitial lung markings with mild stable mid left lung and left basal scarring and/or atelectasis. - Chest CTA is currently pending.  The patient was given 3 duo nebs, 125 mg IV Solu-Medrol, 875 mg p.o. Augmentin and 2 g of IV magnesium sulfate.  She will be admitted to a PCU bed for further evaluation and management. PAST MEDICAL HISTORY:   Past Medical History:  Diagnosis Date  . Anxiety   . Asthma   . Cancer ([place]) 12/2018   w/u for right upper lobe mass/cancer  . COPD (chronic obstructive pulmonary disease) (H[facility])    also, emphysema. now using o2 via np 24 hours a day  . Coronary artery disease   . Depression   . Diabetes mellitus, type II ([place])   . GERD (gastroesophageal reflux disease)   . Headache    migraines in early 20's  . Heart murmur   . Hypertension   . Lung cancer ([place])   . Neuropathy   . Restless leg     PAST SURGICAL HISTORY:   Past Surgical History:  Procedure Laterality Date  . BACK SURGERY  2005   surgery x 2, disc fused in neck, pinched nerve in center of back and neck  . CARDIAC CATHETERIZATION  2015   1 stent placed for blockage  . cervical bone infusion    . CHOLECYSTECTOMY    . THORACIC DISC SURGERY    . TUBAL LIGATION    .  VIDEO BRONCHOSCOPY WITH ENDOBRONCHIAL ULTRASOUND N/A 03/06/2019   Procedure: VIDEO BRONCHOSCOPY WITH ENDOBRONCHIAL ULTRASOUND - DIABETIC;  Surgeon: Ottie Glazier, MD;  Location: [facility] ORS;  Service: Thoracic;  Laterality: N/A;    SOCIAL HISTORY:   Social History   Tobacco Use  . Smoking status: Every Day    Packs/day: 1.00    Years: 40.00    Pack years: 40.00    Types: Cigarettes    Start date: 05/05/1975  . Smokeless tobacco: Never  Substance Use Topics  . Alcohol use: No    Alcohol/week: 0.0 standard drinks    Comment: socially    FAMILY HISTORY:   Family History  Problem Relation Age of Onset  . Anxiety disorder Mother   . Cancer - Lung Mother   . Depression Sister   .  Cancer Sister   . Cancer Sister   . Cancer Sister   . Cancer Sister   . Heart Problems Sister   . Bipolar disorder Sister   . Diabetes Sister   . Cancer - Lung Sister   . Hypertension Sister   . Diabetes Sister   . Hyperlipidemia Sister   . COPD Sister   . Depression Brother   . Heart Problems Brother   . Cancer Brother   . Cancer Brother   . Cancer Brother   . Breast cancer Maternal Aunt   . Depression Son     DRUG ALLERGIES:   Allergies  Allergen Reactions  . Diclofenac-Misoprostol Other (See Comments)    Arthrotec - gastritis   . Nsaids Other (See Comments)    gastritis  . Zolpidem Other (See Comments)    Sleep walking   . Hydrocodone-Acetaminophen Nausea And Vomiting  . Simvastatin Other (See Comments)    body aches    REVIEW OF SYSTEMS:   ROS As per history of present illness. All pertinent systems were reviewed above. Constitutional, HEENT, cardiovascular, respiratory, GI, GU, musculoskeletal, neuro, psychiatric, endocrine, integumentary and hematologic systems were reviewed and are otherwise negative/unremarkable except for positive findings mentioned above in the HPI.   MEDICATIONS AT HOME:   Prior to Admission medications   Medication Sig Start Date End Date Taking? Authorizing Provider  ACCU-CHEK FASTCLIX LANCETS Solomon  02/18/15   [provider]  ACCU-CHEK SMARTVIEW test strip  02/18/15   [provider]  albuterol (VENTOLIN HFA) 108 (90 Base) MCG/ACT inhaler Inhale 2 puffs into the lungs every 4 (four) hours as needed for wheezing or shortness of breath.  03/22/14   [provider]  amLODipine (NORVASC) 10 MG tablet Take 10 mg by mouth daily.     [provider]  carbidopa-levodopa (SINEMET IR) 25-100 MG tablet Take 1 tablet by mouth 3 (three) times daily.    [provider]  cyanocobalamin 1000 MCG tablet Take 1 tablet by mouth daily.    [provider]  esomeprazole (NEXIUM) 40 MG capsule Take 40 mg by  mouth daily.    [provider]  eszopiclone 3 MG TABS Take 1 tablet (3 mg total) by mouth at bedtime as needed. Take immediately before bedtime 05/31/21 05/31/22  Norman Clay, MD  ferrous sulfate 325 (65 FE) MG tablet Take 1 tablet (325 mg total) by mouth 2 (two) times daily with a meal. 01/02/19   Vaughan Basta, MD  Fluticasone-Umeclidin-Vilant 100-62.5-25 MCG/INH AEPB Inhale 1 puff into the lungs daily. [place] 11/28/18   [provider]  gabapentin (NEURONTIN) 400 MG capsule Take 1 capsule (400 mg total) by mouth 3 (  three) times daily. 05/31/21 08/29/21  Norman Clay, MD  ipratropium-albuterol (DUONEB) 0.5-2.5 (3) MG/3ML SOLN Take 3 mLs by nebulization every 6 (six) hours. 04/17/21   Sharen Hones, MD  metFORMIN (GLUCOPHAGE) 500 MG tablet Take 500 mg by mouth daily with breakfast.     [provider]  metoprolol tartrate (LOPRESSOR) 25 MG tablet Take 25 mg by mouth 2 (two) times daily.     [provider]  Multiple Vitamin (MULTIVITAMIN) tablet Take 1 tablet by mouth daily.    [provider]  nicotine (NICODERM CQ - DOSED IN MG/24 HOURS) 21 mg/24hr patch Place 1 patch (21 mg total) onto the skin daily. 04/18/21   Sharen Hones, MD  OXYGEN Inhale 2 L into the lungs continuous.    [provider]  pravastatin (PRAVACHOL) 80 MG tablet Take 80 mg by mouth every evening.     [provider]  spironolactone (ALDACTONE) 25 MG tablet Take 25 mg by mouth daily.     [provider]  venlafaxine XR (EFFEXOR-XR) 150 MG 24 hr capsule Take total of 225 mg daily, take along with 75 mg cap 05/31/21   Norman Clay, MD  venlafaxine XR (EFFEXOR-XR) 75 MG 24 hr capsule Take total of 225 mg daily, take along with 150 mg cap 05/31/21   Hisada, Elie Goody, MD      VITAL SIGNS:  Blood pressure (!) 147/110, pulse (!) 104, temperature 98.7 F (37.1 C), temperature source Oral, resp. rate (!) 23, height 5\' 4"  (1.626 m), weight 84.8 kg, SpO2 100  %.  PHYSICAL EXAMINATION:  Physical Exam  GENERAL:  70 y.o.-year-old Caucasian female patient lying in the bed with mild respiratory distress with conversation dyspnea. EYES: Pupils equal, round, reactive to light and accommodation. No scleral icterus. Extraocular muscles intact.  HEENT: Head atraumatic, normocephalic. Oropharynx and nasopharynx clear.  NECK:  Supple, no jugular venous distention. No thyroid enlargement, no tenderness.  LUNGS: Diffuse expiratory wheezes with tight expiratory airflow and harsh vesicular breathing.. No use of accessory muscles of respiration.  CARDIOVASCULAR: Regular rate and rhythm, S1, S2 normal. No murmurs, rubs, or gallops.  ABDOMEN: Soft, nondistended, nontender. Bowel sounds present. No organomegaly or mass.  EXTREMITIES: No pedal edema, cyanosis, or clubbing.  NEUROLOGIC: Cranial nerves II through XII are intact. Muscle strength 5/5 in all extremities. Sensation intact. Gait not checked.  PSYCHIATRIC: The patient is alert and oriented x 3.  Normal affect and good eye contact. SKIN: No obvious rash, lesion, or ulcer.   LABORATORY PANEL:   CBC Recent Labs  Lab 08/18/21 1630  WBC 11.8*  HGB 11.8*  HCT 37.8  PLT 417*   ------------------------------------------------------------------------------------------------------------------  Chemistries  Recent Labs  Lab 08/18/21 1630  NA 138  K 4.2  CL 91*  CO2 35*  GLUCOSE 131*  BUN 19  CREATININE 0.70  CALCIUM 9.7  AST 31  ALT 23  ALKPHOS 96  BILITOT 0.4   ------------------------------------------------------------------------------------------------------------------  Cardiac Enzymes No results for input(s): TROPONINI in the last 168 hours. ------------------------------------------------------------------------------------------------------------------  RADIOLOGY:  DG Chest 2 View  Result Date: 08/18/2021 CLINICAL DATA:  Productive cough and shortness of breath. EXAM: CHEST - 2  VIEW COMPARISON:  April 14, 2021 FINDINGS: Dominant stable mild to moderate severity diffuse chronic appearing increased interstitial lung markings are seen. Mild, stable areas of atelectasis and/or scarring are seen within the mid left lung and left lung base. There is no evidence of a pleural effusion or pneumothorax. The heart size and mediastinal contours are within  normal limits. A radiopaque fusion plate and screws are seen overlying the lower cervical spine. Degenerative changes are noted throughout the thoracic spine. IMPRESSION: Chronic appearing increased interstitial lung markings with mild, stable mid left lung and left basilar scarring and/or atelectasis. Electronically Signed   By: Virgina Norfolk M.D.   On: 08/18/2021 17:14      IMPRESSION AND PLAN:  Active Problems:   Acute on chronic respiratory failure ([place])  1.  Acute on chronic respiratory failure with hypoxia, secondary to severe COPD exacerbation. - The patient will be admitted to a PCU bed. - We will continue steroid therapy with IV Solu-Medrol. - We will continue bronchodilator therapy with DuoNebs 4 times daily and every 4 hours as needed. - She will be continued on antibiotic therapy with IV Rocephin and p.o. Zithromax. - We will follow sputum and blood cultures. - Mucolytic therapy will be provided. - Chest CTA is currently pending.  2.  Essential hypertension. - We will continue amlodipine and Lopressor.  3.  Type 2 diabetes mellitus with peripheral neuropathy. - We will place her on supplement coverage with NovoLog. - We will hold off metformin.  4.  Dyslipidemia. - We will continue statin therapy.  5.  Depression. - We will continue Effexor XR.    DVT prophylaxis: Lovenox. Code Status: full code. Family Communication:  The plan of care was discussed in details with the patient (and family). I answered all questions. The patient agreed to proceed with the above mentioned plan. Further management will  depend upon hospital course. Disposition Plan: Back to previous home environment Consults called: none. All the records are reviewed and case discussed with ED provider.  Status is: Inpatient  Remains inpatient appropriate because:Ongoing diagnostic testing needed not appropriate for outpatient work up, Unsafe d/c plan, IV treatments appropriate due to intensity of illness or inability to take PO, and Inpatient level of care appropriate due to severity of illness   Dispo: The patient is from: Home              Anticipated d/c is to: Home              Patient currently is not medically stable to d/c.              Difficult to place patient: No  TOTAL TIME TAKING CARE OF THIS PATIENT: 55 minutes.     Christel Mormon M.D on 08/19/2021 at 5:25 AM  [facility]   From 7 PM-7 AM, contact night-coverage [email or web address]  CC: Primary care physician; Tracie Harrier, MD

## 2021-08-19 NOTE — ED Notes (Addendum)
Pt cleaned of urine and assisted to commode. Pt w/ productive cough noted at this time.

## 2021-08-19 NOTE — ED Notes (Signed)
Pt remains on 2 liter's  

## 2021-08-20 LAB — CBC
HCT: 33.6 % — ABNORMAL LOW (ref 36.0–46.0)
Hemoglobin: 10 g/dL — ABNORMAL LOW (ref 12.0–15.0)
MCH: 28.3 pg (ref 26.0–34.0)
MCHC: 29.8 g/dL — ABNORMAL LOW (ref 30.0–36.0)
MCV: 95.2 fL (ref 80.0–100.0)
Platelets: 413 10*3/uL — ABNORMAL HIGH (ref 150–400)
RBC: 3.53 MIL/uL — ABNORMAL LOW (ref 3.87–5.11)
RDW: 14.2 % (ref 11.5–15.5)
WBC: 16 10*3/uL — ABNORMAL HIGH (ref 4.0–10.5)
nRBC: 0 % (ref 0.0–0.2)

## 2021-08-20 LAB — BASIC METABOLIC PANEL
Anion gap: 5 (ref 5–15)
BUN: 15 mg/dL (ref 8–23)
CO2: 38 mmol/L — ABNORMAL HIGH (ref 22–32)
Calcium: 8.9 mg/dL (ref 8.9–10.3)
Chloride: 95 mmol/L — ABNORMAL LOW (ref 98–111)
Creatinine, Ser: 0.66 mg/dL (ref 0.44–1.00)
GFR, Estimated: >60 mL/min (ref >60)
Glucose, Bld: 152 mg/dL — ABNORMAL HIGH (ref 70–99)
Potassium: 4.9 mmol/L (ref 3.5–5.1)
Sodium: 138 mmol/L (ref 135–145)

## 2021-08-20 LAB — HEMOGLOBIN A1C
Hgb A1c MFr Bld: 6.8 % — ABNORMAL HIGH (ref 4.8–5.6)
Mean Plasma Glucose: 148.46 mg/dL

## 2021-08-20 LAB — GLUCOSE, CAPILLARY
Glucose-Capillary: 131 mg/dL — ABNORMAL HIGH (ref 70–99)
Glucose-Capillary: 290 mg/dL — ABNORMAL HIGH (ref 70–99)
Glucose-Capillary: 194 mg/dL — ABNORMAL HIGH (ref 70–99)
Glucose-Capillary: 215 mg/dL — ABNORMAL HIGH (ref 70–99)

## 2021-08-20 MED ORDER — ENOXAPARIN SODIUM 40 MG/0.4ML IJ SOSY: 0.5000 mg/kg | PREFILLED_SYRINGE | INTRAMUSCULAR | Status: DC

## 2021-08-20 MED ORDER — INSULIN GLARGINE-YFGN 100 UNIT/ML ~~LOC~~ SOLN
10.0000 [IU] | Freq: Two times a day (BID) | SUBCUTANEOUS | Status: DC
  Administered 2021-08-20 – 2021-08-22 (×4): 10 [IU] via SUBCUTANEOUS
  Filled 2021-08-20 (×6): qty 0.1

## 2021-08-20 MED ORDER — ENOXAPARIN SODIUM 60 MG/0.6ML IJ SOSY
42.5000 mg | PREFILLED_SYRINGE | INTRAMUSCULAR | Status: DC
  Administered 2021-08-20 – 2021-08-22 (×3): 42.5 mg via SUBCUTANEOUS
  Filled 2021-08-20 (×3): qty 0.6

## 2021-08-20 MED ORDER — INSULIN ASPART 100 UNIT/ML IJ SOLN
4.0000 [IU] | Freq: Three times a day (TID) | INTRAMUSCULAR | Status: DC
  Administered 2021-08-20 – 2021-08-22 (×6): 4 [IU] via SUBCUTANEOUS
  Filled 2021-08-20 (×6): qty 1

## 2021-08-20 MED ORDER — INSULIN ASPART 100 UNIT/ML IJ SOLN
0.0000 [IU] | Freq: Three times a day (TID) | INTRAMUSCULAR | Status: DC
  Administered 2021-08-20 – 2021-08-21 (×2): 4 [IU] via SUBCUTANEOUS
  Administered 2021-08-21: 7 [IU] via SUBCUTANEOUS
  Administered 2021-08-22: 4 [IU] via SUBCUTANEOUS
  Administered 2021-08-22: 3 [IU] via SUBCUTANEOUS
  Filled 2021-08-20 (×5): qty 1

## 2021-08-20 MED ORDER — INSULIN ASPART 100 UNIT/ML IJ SOLN
0.0000 [IU] | Freq: Every day | INTRAMUSCULAR | Status: DC
  Administered 2021-08-20: 2 [IU] via SUBCUTANEOUS
  Filled 2021-08-20: qty 1

## 2021-08-20 NOTE — Progress Notes (Signed)
PROGRESS NOTE    Suzanne Glenn  JSH:[id number] DOB: 1951/06/25 DOA: 08/19/2021 PCP: Tracie Harrier, MD   Brief Narrative: Taken from prior notes. Suzanne Glenn is a 70 y.o. Caucasian female with medical history significant for asthma, COPD, type diabetes mellitus, hypertension and lung cancer as well as chronic respiratory failure on home O2 at 2 L/min, who presented to the ER with acute onset of worsening dyspnea over the last 4 days with associated cough productive of green sputum and chest congestion.  She admitted to associated fever and chills as well as malaise and night sweats.  She has been having mild sore throat.  She was saturating in low 80s on 3 L of oxygen when came to ED, oxygenation improved by placing her on 4 L of oxygen.   Labs pertinent for mild leukocytosis, thrombocytosis with neutrophilia.  Influenza antigen and COVID-19 PCR came back negative.  Chest x-ray with chronic increased interstitial markings with mild stable mid left flank and left base scarring and/or atelectasis. CTA was negative for PE but did show some concern of bronchitis.   She was admitted for acute on chronic hypoxic respiratory failure secondary to COPD exacerbation. Patient continued to require more than her baseline of oxygen, currently at 4 L.  Continue to have wheeze, able to speak full sentences but becoming short of breath while talking. Respiratory viral panel pending.  Procalcitonin at 0.16.  Subjective: Patient was seen and examined today.  Continues to have wheezing and shortness of breath.  Saturating in low 90s on 4 L of oxygen.  Baseline 2 to 3 L.  She wants to go home but agrees to stay another night.  Lives with her husband.  Assessment & Plan:   Active Problems:   COPD exacerbation ([place])   Acute on chronic respiratory failure (HCC)  Acute on chronic hypoxic respiratory failure secondary to COPD exacerbation.  Procalcitonin at 0.16 so she was continued on antibiotics.   Respiratory viral panel pending. -Solu-Medrol was switched with prednisone. -Continue with ceftriaxone and Zithromax -Continue with supportive care -Continue with bronchodilators -Continue with supplemental oxygen-try weaning to baseline as tolerated  Essential hypertension.  Blood pressure mildly elevated. -Continue with home dose of amlodipine and Lopressor  Type 2 diabetes mellitus with peripheral neuropathy.  On metformin at home.  CBG elevated, patient is on steroid. -Continue with SSI -Add 10 units of Semglee -Add 4 units with meals  Depression. -Continue home dose of Effexor  Dyslipidemia. -Continue with statin  Objective: Vitals:   08/20/21 0312 08/20/21 0800 08/20/21 0800 08/20/21 0828  BP: 125/66 (!) 148/77 (!) 148/77   Pulse: 87 71 93   Resp: 20 14 14    Temp: 98.1 F (36.7 C) 97.9 F (36.6 C) 97.9 F (36.6 C)   TempSrc: Oral     SpO2: 93% (!) 88% (!) 88% 92%  Weight:      Height:        Intake/Output Summary (Last 24 hours) at 08/20/2021 1439 Last data filed at 08/19/2021 2339 Gross per 24 hour  Intake 1000 ml  Output --  Net 1000 ml   Filed Weights   08/18/21 1622  Weight: 84.8 kg    Examination:  General exam: Appears calm and comfortable  Respiratory system: Bilateral scattered wheeze, mildly increased work of breathing. Cardiovascular system: S1 & S2 heard, RRR. No JVD, murmurs, rubs, gallops or clicks. Gastrointestinal system: Soft, nontender, nondistended, bowel sounds positive. Central nervous system: Alert and oriented. No focal neurological deficits.Symmetric 5  x 5 power. Extremities: No edema, no cyanosis, pulses intact and symmetrical. Psychiatry: Judgement and insight appear normal. Mood & affect appropriate.    DVT prophylaxis: Lovenox Code Status: Full Family Communication: Discussed with patient Disposition Plan:  Status is: Inpatient  Remains inpatient appropriate because: Severity of illness  Level of care: Med-Surg  All  the records are reviewed and case discussed with Care Management/Social Worker. Management plans discussed with the patient, nursing and they are in agreement.  Consultants:  None  Procedures:  Antimicrobials:  Ceftriaxone Zithromax  Data Reviewed: I have personally reviewed following labs and imaging studies  CBC: Recent Labs  Lab 08/18/21 1630 08/20/21 0433  WBC 11.8* 16.0*  NEUTROABS 9.0*  --   HGB 11.8* 10.0*  HCT 37.8 33.6*  MCV 93.8 95.2  PLT 417* 213*   Basic Metabolic Panel: Recent Labs  Lab 08/18/21 1630 08/20/21 0433  NA 138 138  K 4.2 4.9  CL 91* 95*  CO2 35* 38*  GLUCOSE 131* 152*  BUN 19 15  CREATININE 0.70 0.66  CALCIUM 9.7 8.9   GFR: Estimated Creatinine Clearance: 69.9 mL/min (by C-G formula based on SCr of 0.66 mg/dL). Liver Function Tests: Recent Labs  Lab 08/18/21 1630  AST 31  ALT 23  ALKPHOS 96  BILITOT 0.4  PROT 7.7  ALBUMIN 3.2*   No results for input(s): LIPASE, AMYLASE in the last 168 hours. No results for input(s): AMMONIA in the last 168 hours. Coagulation Profile: No results for input(s): INR, PROTIME in the last 168 hours. Cardiac Enzymes: No results for input(s): CKTOTAL, CKMB, CKMBINDEX, TROPONINI in the last 168 hours. BNP (last 3 results) No results for input(s): PROBNP in the last 8760 hours. HbA1C: No results for input(s): HGBA1C in the last 72 hours. CBG: Recent Labs  Lab 08/20/21 0802 08/20/21 1136  GLUCAP 131* 290*   Lipid Profile: No results for input(s): CHOL, HDL, LDLCALC, TRIG, CHOLHDL, LDLDIRECT in the last 72 hours. Thyroid Function Tests: No results for input(s): TSH, T4TOTAL, FREET4, T3FREE, THYROIDAB in the last 72 hours. Anemia Panel: No results for input(s): VITAMINB12, FOLATE, FERRITIN, TIBC, IRON, RETICCTPCT in the last 72 hours. Sepsis Labs: Recent Labs  Lab 08/18/21 1630  PROCALCITON 0.16    Recent Results (from the past 240 hour(s))  Resp Panel by RT-PCR (Flu A&B, Covid)  Nasopharyngeal Swab     Status: None   Collection Time: 08/18/21  4:32 PM   Specimen: Nasopharyngeal Swab; Nasopharyngeal(NP) swabs in vial transport medium  Result Value Ref Range Status   SARS Coronavirus 2 by RT PCR NEGATIVE NEGATIVE Final    Comment: (NOTE) SARS-CoV-2 target nucleic acids are NOT DETECTED.  The SARS-CoV-2 RNA is generally detectable in upper respiratory specimens during the acute phase of infection. The lowest concentration of SARS-CoV-2 viral copies this assay can detect is 138 copies/mL. A negative result does not preclude SARS-Cov-2 infection and should not be used as the sole basis for treatment or other patient management decisions. A negative result may occur with  improper specimen collection/handling, submission of specimen other than nasopharyngeal swab, presence of viral mutation(s) within the areas targeted by this assay, and inadequate number of viral copies(<138 copies/mL). A negative result must be combined with clinical observations, patient history, and epidemiological information. The expected result is Negative.  Fact Sheet for Patients:  EntrepreneurPulse.com.au  Fact Sheet for Healthcare Providers:  IncredibleEmployment.be  This test is no t yet approved or cleared by the [place] FDA and  has been  authorized for detection and/or diagnosis of SARS-CoV-2 by FDA under an Emergency Use Authorization (EUA). This EUA will remain  in effect (meaning this test can be used) for the duration of the COVID-19 declaration under Section 564(b)(1) of the Act, 21 U.S.C.section 360bbb-3(b)(1), unless the authorization is terminated  or revoked sooner.       Influenza A by PCR NEGATIVE NEGATIVE Final   Influenza B by PCR NEGATIVE NEGATIVE Final    Comment: (NOTE) The Xpert Xpress SARS-CoV-2/FLU/RSV plus assay is intended as an aid in the diagnosis of influenza from Nasopharyngeal swab specimens and should not be  used as a sole basis for treatment. Nasal washings and aspirates are unacceptable for Xpert Xpress SARS-CoV-2/FLU/RSV testing.  Fact Sheet for Patients: EntrepreneurPulse.com.au  Fact Sheet for Healthcare Providers: IncredibleEmployment.be  This test is not yet approved or cleared by the [place] FDA and has been authorized for detection and/or diagnosis of SARS-CoV-2 by FDA under an Emergency Use Authorization (EUA). This EUA will remain in effect (meaning this test can be used) for the duration of the COVID-19 declaration under Section 564(b)(1) of the Act, 21 U.S.C. section 360bbb-3(b)(1), unless the authorization is terminated or revoked.  Performed at [facility], [address] [place], [place] [address]   Expectorated Sputum Assessment w Gram Stain, Rflx to Resp Cult     Status: None   Collection Time: 08/19/21  5:00 PM   Specimen: Sputum  Result Value Ref Range Status   Specimen Description SPUTUM  Final   Special Requests NONE  Final   Sputum evaluation   Final    THIS SPECIMEN IS ACCEPTABLE FOR SPUTUM CULTURE Performed at [facility], [address] [place], [place] [address]    Report Status 08/19/2021 FINAL  Final  Culture, Respiratory w Gram Stain     Status: None (Preliminary result)   Collection Time: 08/19/21  5:00 PM   Specimen: SPU  Result Value Ref Range Status   Specimen Description   Final    SPUTUM Performed at [facility], [address], [place], [place] [address]    Special Requests   Final    NONE Reflexed from [id number] Performed at [facility], [address] [place], [place] [address]    Gram Stain   Final    FEW SQUAMOUS EPITHELIAL CELLS PRESENT MODERATE WBC SEEN FEW GRAM POSITIVE COCCI    Culture   Final    TOO YOUNG TO READ Performed at [facility], [place] [address], [place], [place] [address]    Report Status PENDING  Incomplete     Radiology  Studies: DG Chest 2 View  Result Date: 08/18/2021 CLINICAL DATA:  Productive cough and shortness of breath. EXAM: CHEST - 2 VIEW COMPARISON:  April 14, 2021 FINDINGS: Dominant stable mild to moderate severity diffuse chronic appearing increased interstitial lung markings are seen. Mild, stable areas of atelectasis and/or scarring are seen within the mid left lung and left lung base. There is no evidence of a pleural effusion or pneumothorax. The heart size and mediastinal contours are within normal limits. A radiopaque fusion plate and screws are seen overlying the lower cervical spine. Degenerative changes are noted throughout the thoracic spine. IMPRESSION: Chronic appearing increased interstitial lung markings with mild, stable mid left lung and left basilar scarring and/or atelectasis. Electronically Signed   By: Virgina Norfolk M.D.   On: 08/18/2021 17:14   CT Angio Chest PE W and/or Wo Contrast  Result Date:  08/19/2021 CLINICAL DATA:  Dyspnea, night sweats, malaise, hypoxia. PE suspected, high prob. 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 EXAM: CT ANGIOGRAPHY CHEST WITH CONTRAST TECHNIQUE: Multidetector CT imaging of the chest was performed using the standard protocol during bolus administration of intravenous contrast. Multiplanar CT image reconstructions and MIPs were obtained to evaluate the vascular anatomy. CONTRAST:  94mL OMNIPAQUE IOHEXOL 350 MG/ML SOLN COMPARISON:  07/26/2021 chest CT. FINDINGS: Cardiovascular: The study is high quality for the evaluation of pulmonary embolism. There are no filling defects in the central, lobar, segmental or subsegmental pulmonary artery branches to suggest acute pulmonary embolism. Atherosclerotic nonaneurysmal thoracic aorta. Normal caliber pulmonary arteries. Normal heart size. Stable trace pericardial effusion/thickening. Three-vessel coronary atherosclerosis. Mediastinum/Nodes: No discrete thyroid  nodules. Unremarkable esophagus. No axillary adenopathy. Newly enlarged 1.5 cm subcarinal lymph node (series 5/image 42). No hilar adenopathy. Lungs/Pleura: No pneumothorax. No pleural effusion. Mild centrilobular emphysema. Mild to moderate patchy tree-in-bud opacities at the left greater than right lung bases with scattered mucoid impaction, new/increased. Chronic sharply marginated consolidation, distortion and volume loss in the lingula is unchanged. No acute consolidative airspace disease. No new significant pulmonary nodules. Upper abdomen: Cholecystectomy. Stable subcentimeter hypodense right liver lesion, too small to characterize. Stable 1.1 cm left adrenal nodule with density -5 HU, compatible with benign adenoma. Musculoskeletal: No aggressive appearing focal osseous lesions. Moderate thoracic spondylosis. Partially visualized surgical hardware from ACDF. Review of the MIP images confirms the above findings. IMPRESSION: 1. No pulmonary embolism. 2. Mild to moderate patchy tree-in-bud opacities at the left greater than right lung bases with scattered mucoid impaction, new/increased, compatible with nonspecific mild infectious or inflammatory bronchiolitis, with the differential including aspiration. 3. Stable chronic sharply marginated consolidation, distortion and volume loss in the lingula, compatible with chronic post treatment change. 4. New mild subcarinal lymphadenopathy, nonspecific. Attention on follow-up chest CT with IV contrast recommended in 3 months. 5. Three-vessel coronary atherosclerosis. 6. Stable left adrenal adenoma. 7. Aortic Atherosclerosis (ICD10-I70.0) and Emphysema (ICD10-J43.9). Electronically Signed   By: Ilona Sorrel M.D.   On: 08/19/2021 06:34    Scheduled Meds:  amLODipine  10 mg Oral Daily   azithromycin  500 mg Oral Daily   carbidopa-levodopa  1 tablet Oral TID   enoxaparin (LOVENOX) injection  42.5 mg Subcutaneous Q24H   ferrous sulfate  325 mg Oral BID WC    gabapentin  400 mg Oral TID   ipratropium-albuterol  3 mL Nebulization QID   metoprolol tartrate  25 mg Oral BID   multivitamin with minerals  1 tablet Oral Daily   nicotine  21 mg Transdermal Daily   pantoprazole  40 mg Oral Daily   pravastatin  80 mg Oral QPM   predniSONE  40 mg Oral Q breakfast   spironolactone  25 mg Oral Daily   venlafaxine XR  225 mg Oral Q breakfast   cyanocobalamin  1,000 mcg Oral Daily   Continuous Infusions:  sodium chloride 100 mL/hr at 08/20/21 1002   cefTRIAXone (ROCEPHIN)  IV 1 g (08/20/21 0637)     LOS: 1 day   Time spent: 40 minutes. More than 50% of the time was spent in counseling/coordination of care  Lorella Nimrod, MD [facility]  If 7PM-7AM, please contact night-coverage [email or web address]  08/20/2021, 2:39 PM   This record has been created using Systems analyst. Errors have been sought and corrected,but may not always be located. Such creation errors do not reflect on the  standard of care.

## 2021-08-20 NOTE — TOC Initial Note (Signed)
Transition of Care [facility]) - Initial/Assessment Note    Patient Details  Name: Suzanne Glenn MRN: [id number] Date of Birth: 05-Nov-1950  Transition of Care [facility]) CM/SW Contact:    Magnus Ivan, LCSW Phone Number: 08/20/2021, 2:51 PM  Clinical Narrative:             Spoke with husband Abe People for assessment. Patient lives with spouse who provides transportation. PCP is Dr. Ginette Pitman. Pharmacy is [facility]. Has home o2 thorugh [employer]. Also has a RW and shower chair. No HH or SNF history. Abe People denied needs at this time. TOC to follow.   Expected Discharge Plan: Home/Self Care Barriers to Discharge: Continued Medical Work up   Patient Goals and CMS Choice Patient states their goals for this hospitalization and ongoing recovery are:: home with spouse CMS Medicare.gov Compare Post Acute Care list provided to:: Patient Represenative (must comment) Choice offered to / list presented to : Spouse  Expected Discharge Plan and Services Expected Discharge Plan: Home/Self Care       Living arrangements for the past 2 months: Single Family Home                                      Prior Living Arrangements/Services Living arrangements for the past 2 months: Single Family Home Lives with:: Spouse Patient language and need for interpreter reviewed:: Yes        Need for Family Participation in Patient Care: Yes (Comment) Care giver support system in place?: Yes (comment) Current home services: DME Criminal Activity/Legal Involvement Pertinent to Current Situation/Hospitalization: No - Comment as needed  Activities of Daily Living      Permission Sought/Granted                  Emotional Assessment         Alcohol / Substance Use: Not Applicable Psych Involvement: No (comment)  Admission diagnosis:  COPD exacerbation ([place]) [J44.1] Acute on chronic respiratory failure (HCC) [J96.20] Acute on chronic respiratory failure with hypoxia ([place]) [J96.21] Patient  Active Problem List   Diagnosis Date Noted   Acute on chronic respiratory failure ([place]) 08/19/2021   COPD exacerbation ([place]) 04/14/2021   HLD (hyperlipidemia) 04/14/2021   Diabetes mellitus without complication ( [place]) 03/70/4888   Depression 04/14/2021   CAD (coronary artery disease) 04/14/2021   Iron deficiency anemia 04/14/2021   Sepsis ([place]) 04/14/2021   RLS (restless legs syndrome) 04/14/2021   Acute on chronic respiratory failure with hypoxia ([place]) 03/20/2021   Atypical pneumonia 03/19/2021   MDD (major depressive disorder), recurrent, in full remission ([place]) 12/11/2019   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[place]) 07/26/2019   Goals of care, counseling/discussion 07/26/2019   Major depressive disorder, recurrent, in partial remission ([place]) 03/27/2019   Chronic respiratory failure with hypoxia ([place]) 03/27/2019   Urge incontinence of urine 03/27/2019   Tobacco use 01/13/2019   Mass of upper lobe of left lung 01/13/2019   Symptomatic anemia 01/01/2019   Restless leg syndrome 06/30/2018   Pure hypercholesterolemia 09/26/2017   Sepsis due to pneumonia ([place]) 02/24/2017   Bilateral carotid artery stenosis 03/30/2016   Syncope and collapse 03/30/2016   Essential (primary) hypertension 08/29/2015   Controlled type 2 diabetes mellitus without complication ([place]) 91/69/4503   Combined fat and carbohydrate induced hyperlipemia 06/30/2015   Clinical depression 05/05/2015   GAD (generalized anxiety disorder) 05/05/2015   Depression, major, recurrent, moderate ([place]) 05/05/2015  Benign essential HTN 04/05/2015   Diabetes mellitus, type 2 ([place]) 02/09/2015   Type 2 diabetes mellitus ([place]) 02/09/2015   Cannot sleep 01/24/2015   Fibromyalgia 01/24/2015   CAFL (chronic airflow limitation) ([place]) 01/24/2015   H/O diabetes mellitus 01/24/2015   H/O hypercholesterolemia 01/24/2015   H/O: HTN (hypertension) 01/24/2015   Coronary artery disease 01/24/2015   Hypokalemia 01/24/2015    Arteriosclerosis of coronary artery 10/04/2014   Chest pain 10/04/2014   3-vessel CAD 08/02/2014   Acute chest pain 06/25/2014   SOB (shortness of breath) 06/25/2014   COPD, moderate ([place]) 06/08/2014   Moderate COPD (chronic obstructive pulmonary disease) ([place]) 06/08/2014   Gastro-esophageal reflux disease without esophagitis 05/31/2014   PCP:  Tracie Harrier, MD Pharmacy:   [facility] [place] [address] Phone: [phone] Fax: [phone]     Social Determinants of Health (SDOH) Interventions    Readmission Risk Interventions Readmission Risk Prevention Plan 08/20/2021 03/21/2021  Transportation Screening Complete Complete  PCP or Specialist Appt within 3-5 Days Complete Complete  HRI or Home Care Consult Complete -  Social Work Consult for [place] Planning/Counseling Complete Complete  Palliative Care Screening Not Applicable Not Applicable  Medication Review Press photographer) Complete Complete  Some recent data might be hidden

## 2021-08-21 ENCOUNTER — Encounter: Payer: Self-pay | Admitting: Family Medicine

## 2021-08-21 LAB — GLUCOSE, CAPILLARY
Glucose-Capillary: 104 mg/dL — ABNORMAL HIGH (ref 70–99)
Glucose-Capillary: 183 mg/dL — ABNORMAL HIGH (ref 70–99)
Glucose-Capillary: 231 mg/dL — ABNORMAL HIGH (ref 70–99)
Glucose-Capillary: 174 mg/dL — ABNORMAL HIGH (ref 70–99)

## 2021-08-21 LAB — RESPIRATORY PANEL BY PCR

## 2021-08-21 MED ORDER — ALBUTEROL SULFATE (2.5 MG/3ML) 0.083% IN NEBU: 2.5000 mg | INHALATION_SOLUTION | RESPIRATORY_TRACT | Status: DC | PRN

## 2021-08-21 MED ORDER — DM-GUAIFENESIN ER 30-600 MG PO TB12
1.0000 | ORAL_TABLET | Freq: Two times a day (BID) | ORAL | Status: DC
  Administered 2021-08-21 – 2021-08-22 (×3): 1 via ORAL
  Filled 2021-08-21 (×3): qty 1

## 2021-08-21 MED ORDER — ENSURE ENLIVE PO LIQD
237.0000 mL | Freq: Two times a day (BID) | ORAL | Status: DC
  Administered 2021-08-21: 237 mL via ORAL

## 2021-08-21 MED ORDER — ORAL CARE MOUTH RINSE
15.0000 mL | Freq: Two times a day (BID) | OROMUCOSAL | Status: DC
  Administered 2021-08-21 – 2021-08-22 (×2): 15 mL via OROMUCOSAL

## 2021-08-21 MED ORDER — IPRATROPIUM-ALBUTEROL 0.5-2.5 (3) MG/3ML IN SOLN
3.0000 mL | Freq: Three times a day (TID) | RESPIRATORY_TRACT | Status: DC
  Administered 2021-08-21 – 2021-08-22 (×3): 3 mL via RESPIRATORY_TRACT
  Filled 2021-08-21 (×4): qty 3

## 2021-08-21 NOTE — Progress Notes (Signed)
Initial Nutrition Assessment  DOCUMENTATION CODES:  Obesity unspecified  INTERVENTION:  Add Ensure Plus High Protein po BID, each supplement provides 350 kcal and 20 grams of protein.   Add orange sherbet with dinner meals - RD to order.  Continue MVI with minerals daily.  Encourage PO intake.  NUTRITION DIAGNOSIS:  Increased nutrient needs related to acute illness (COPD exacerbation) as evidenced by estimated needs.  GOAL:  Patient will meet greater than or equal to 90% of their needs  MONITOR:  PO intake, Supplement acceptance, Labs, Weight trends, I & O's  REASON FOR ASSESSMENT:  Malnutrition Screening Tool    ASSESSMENT:  70 yo female with a PMH of asthma, COPD, type diabetes mellitus, hypertension and lung cancer as well as chronic respiratory failure on home O2 at 2 L/min, who presented to the ER with acute onset of worsening dyspnea over the last 4 days with associated cough productive of green sputum and chest congestion. Admitted with respiratory failure 2/2 COPD exacerbation.  Spoke with pt at bedside. She reports she has been eating okay, but she has been tired and having trouble breathing, so eating has been difficult.  She endorses a little weight loss, but is unsure of how much and over how long. Per Epic, pt has lost ~9 lbs (4.5%) in the last 4 months, which is not necessarily significant for the time frame.  Pt reports not tolerating milk very well. Pt interested in having orange sherbet with dinner and Ensure BID, as well as continuing MVI with minerals daily.  Medications: reviewed; azithromycin PO, Sinemet IR TID, ferrous sulfate BID, SSI, Semglee, MVI with minerals, Protonix, predisone, spironolactone, Vitamin B12, NaCl @ 100 ml/hr  Labs: reviewed; CBG 104-290 (H) HbA1c: 6.8% (08/20/2021)  NUTRITION - FOCUSED PHYSICAL EXAM: Flowsheet Row Most Recent Value  Orbital Region Mild depletion  Upper Arm Region No depletion  Thoracic and Lumbar Region No  depletion  Buccal Region No depletion  Temple Region No depletion  Clavicle Bone Region No depletion  Clavicle and Acromion Bone Region No depletion  Scapular Bone Region No depletion  Dorsal Hand No depletion  Patellar Region No depletion  Anterior Thigh Region No depletion  Posterior Calf Region No depletion  Edema (RD Assessment) None  Hair Reviewed  Eyes Reviewed  Mouth Reviewed  Skin Reviewed  Nails Reviewed   Diet Order:   Diet Order             Diet Heart Room service appropriate? Yes; Fluid consistency: Thin  Diet effective now                  EDUCATION NEEDS:  Education needs have been addressed  Skin:  Skin Assessment: Reviewed RN Assessment  Last BM:  08/18/21 - per pt report  Height:  Ht Readings from Last 1 Encounters:  08/18/21 5\' 4"  (1.626 m)   Weight:  Wt Readings from Last 1 Encounters:  08/18/21 84.8 kg   BMI:  Body mass index is 32.1 kg/m.  Estimated Nutritional Needs:  Kcal:  1850-2050 Protein:  105-120 grams Fluid:  >1.85 L  Derrel Nip, RD, LDN (she/her/hers) Clinical Inpatient Dietitian RD Pager/After-Hours/Weekend Pager # in [place]

## 2021-08-21 NOTE — Progress Notes (Signed)
PROGRESS NOTE    Suzanne Glenn  CVE:[id number] DOB: September 12, 1951 DOA: 08/19/2021 PCP: Tracie Harrier, MD   Brief Narrative: Taken from prior notes. Suzanne Glenn is a 70 y.o. Caucasian female with medical history significant for asthma, COPD, type diabetes mellitus, hypertension and lung cancer as well as chronic respiratory failure on home O2 at 2 L/min, who presented to the ER with acute onset of worsening dyspnea over the last 4 days with associated cough productive of green sputum and chest congestion.  She admitted to associated fever and chills as well as malaise and night sweats.  She has been having mild sore throat.  She was saturating in low 80s on 3 L of oxygen when came to ED, oxygenation improved by placing her on 4 L of oxygen.   Labs pertinent for mild leukocytosis, thrombocytosis with neutrophilia.  Influenza antigen and COVID-19 PCR came back negative.  Chest x-ray with chronic increased interstitial markings with mild stable mid left flank and left base scarring and/or atelectasis. CTA was negative for PE but did show some concern of bronchitis.   She was admitted for acute on chronic hypoxic respiratory failure secondary to COPD exacerbation. Patient continued to require more than her baseline of oxygen, currently at 3 L.  Continue to have wheeze, able to speak full sentences now. Respiratory viral panel negative procalcitonin at 0.16.  Sputum cultures pending  Subjective: Patient was still feeling little short of breath, not at her baseline yet.  Saturating in low 90s on 3 L of oxygen.  Husband at bedside.  She was concerned that breathing treatments are making her chest dry.  Assessment & Plan:   Active Problems:   COPD exacerbation ([place])   Acute on chronic respiratory failure ([facility])  Acute on chronic hypoxic respiratory failure secondary to COPD exacerbation.  Procalcitonin at 0.16 so she was continued on antibiotics.  Respiratory viral panel negative, sputum  cultures pending -Continue with prednisone. -Continue with ceftriaxone and Zithromax -Continue with supportive care -Continue with bronchodilators -Continue with supplemental oxygen-try weaning to baseline as tolerated  Essential hypertension.  Blood pressure within goal now. -Continue with home dose of amlodipine and Lopressor  Type 2 diabetes mellitus with peripheral neuropathy.  On metformin at home.  CBG now within goal.  Patient is on steroid. -Continue with SSI -Continue 10 units of Semglee -Continue 4 units with meals  Depression. -Continue home dose of Effexor  Dyslipidemia. -Continue with statin  Objective: Vitals:   08/20/21 1924 08/20/21 1949 08/21/21 0458 08/21/21 0816  BP: 131/78  (!) 145/65 100/83  Pulse: 96  91 93  Resp: 18  20 20   Temp: 99.1 F (37.3 C)  97.9 F (36.6 C) 98.3 F (36.8 C)  TempSrc: Oral  Oral   SpO2: 93% 90% 93% 91%  Weight:      Height:        Intake/Output Summary (Last 24 hours) at 08/21/2021 1243 Last data filed at 08/21/2021 0258 Gross per 24 hour  Intake 2963.39 ml  Output 800 ml  Net 2163.39 ml    Filed Weights   08/18/21 1622  Weight: 84.8 kg    Examination:  General.  Well-developed lady, in no acute distress. Pulmonary.  Bilateral scattered wheeze, normal respiratory effort. CV.  Regular rate and rhythm, no JVD, rub or murmur. Abdomen.  Soft, nontender, nondistended, BS positive. CNS.  Alert and oriented .  No focal neurologic deficit. Extremities.  No edema, no cyanosis, pulses intact and symmetrical. Psychiatry.  Judgment  and insight appears normal.   DVT prophylaxis: Lovenox Code Status: Full Family Communication: Husband was updated at bedside Disposition Plan:  Status is: Inpatient  Remains inpatient appropriate because: Severity of illness  Level of care: Med-Surg  All the records are reviewed and case discussed with Care Management/Social Worker. Management plans discussed with the patient, nursing  and they are in agreement.  Consultants:  None  Procedures:  Antimicrobials:  Ceftriaxone Zithromax  Data Reviewed: I have personally reviewed following labs and imaging studies  CBC: Recent Labs  Lab 08/18/21 1630 08/20/21 0433  WBC 11.8* 16.0*  NEUTROABS 9.0*  --   HGB 11.8* 10.0*  HCT 37.8 33.6*  MCV 93.8 95.2  PLT 417* 413*    Basic Metabolic Panel: Recent Labs  Lab 08/18/21 1630 08/20/21 0433  NA 138 138  K 4.2 4.9  CL 91* 95*  CO2 35* 38*  GLUCOSE 131* 152*  BUN 19 15  CREATININE 0.70 0.66  CALCIUM 9.7 8.9    GFR: Estimated Creatinine Clearance: 69.9 mL/min (by C-G formula based on SCr of 0.66 mg/dL). Liver Function Tests: Recent Labs  Lab 08/18/21 1630  AST 31  ALT 23  ALKPHOS 96  BILITOT 0.4  PROT 7.7  ALBUMIN 3.2*    No results for input(s): LIPASE, AMYLASE in the last 168 hours. No results for input(s): AMMONIA in the last 168 hours. Coagulation Profile: No results for input(s): INR, PROTIME in the last 168 hours. Cardiac Enzymes: No results for input(s): CKTOTAL, CKMB, CKMBINDEX, TROPONINI in the last 168 hours. BNP (last 3 results) No results for input(s): PROBNP in the last 8760 hours. HbA1C: Recent Labs    08/20/21 0433  HGBA1C 6.8*   CBG: Recent Labs  Lab 08/20/21 1136 08/20/21 1614 08/20/21 2105 08/21/21 0812 08/21/21 1151  GLUCAP 290* 194* 215* 104* 183*    Lipid Profile: No results for input(s): CHOL, HDL, LDLCALC, TRIG, CHOLHDL, LDLDIRECT in the last 72 hours. Thyroid Function Tests: No results for input(s): TSH, T4TOTAL, FREET4, T3FREE, THYROIDAB in the last 72 hours. Anemia Panel: No results for input(s): VITAMINB12, FOLATE, FERRITIN, TIBC, IRON, RETICCTPCT in the last 72 hours. Sepsis Labs: Recent Labs  Lab 08/18/21 1630  PROCALCITON 0.16     Recent Results (from the past 240 hour(s))  Resp Panel by RT-PCR (Flu A&B, Covid) Nasopharyngeal Swab     Status: None   Collection Time: 08/18/21  4:32 PM    Specimen: Nasopharyngeal Swab; Nasopharyngeal(NP) swabs in vial transport medium  Result Value Ref Range Status   SARS Coronavirus 2 by RT PCR NEGATIVE NEGATIVE Final    Comment: (NOTE) SARS-CoV-2 target nucleic acids are NOT DETECTED.  The SARS-CoV-2 RNA is generally detectable in upper respiratory specimens during the acute phase of infection. The lowest concentration of SARS-CoV-2 viral copies this assay can detect is 138 copies/mL. A negative result does not preclude SARS-Cov-2 infection and should not be used as the sole basis for treatment or other patient management decisions. A negative result may occur with  improper specimen collection/handling, submission of specimen other than nasopharyngeal swab, presence of viral mutation(s) within the areas targeted by this assay, and inadequate number of viral copies(<138 copies/mL). A negative result must be combined with clinical observations, patient history, and epidemiological information. The expected result is Negative.  Fact Sheet for Patients:  EntrepreneurPulse.com.au  Fact Sheet for Healthcare Providers:  IncredibleEmployment.be  This test is no t yet approved or cleared by the [place] and  has been authorized  for detection and/or diagnosis of SARS-CoV-2 by FDA under an Emergency Use Authorization (EUA). This EUA will remain  in effect (meaning this test can be used) for the duration of the COVID-19 declaration under Section 564(b)(1) of the Act, 21 U.S.C.section 360bbb-3(b)(1), unless the authorization is terminated  or revoked sooner.       Influenza A by PCR NEGATIVE NEGATIVE Final   Influenza B by PCR NEGATIVE NEGATIVE Final    Comment: (NOTE) The Xpert Xpress SARS-CoV-2/FLU/RSV plus assay is intended as an aid in the diagnosis of influenza from Nasopharyngeal swab specimens and should not be used as a sole basis for treatment. Nasal washings and aspirates are  unacceptable for Xpert Xpress SARS-CoV-2/FLU/RSV testing.  Fact Sheet for Patients: EntrepreneurPulse.com.au  Fact Sheet for Healthcare Providers: IncredibleEmployment.be  This test is not yet approved or cleared by the [place] FDA and has been authorized for detection and/or diagnosis of SARS-CoV-2 by FDA under an Emergency Use Authorization (EUA). This EUA will remain in effect (meaning this test can be used) for the duration of the COVID-19 declaration under Section 564(b)(1) of the Act, 21 U.S.C. section 360bbb-3(b)(1), unless the authorization is terminated or revoked.  Performed at [facility], [place], [place] [address]   Respiratory (~20 pathogens) panel by PCR     Status: None   Collection Time: 08/18/21  4:32 PM   Specimen: Nasopharyngeal Swab; Respiratory  Result Value Ref Range Status   Adenovirus NOT DETECTED NOT DETECTED Final   Coronavirus 229E NOT DETECTED NOT DETECTED Final    Comment: (NOTE) The Coronavirus on the Respiratory Panel, DOES NOT test for the novel  Coronavirus (2019 nCoV)    Coronavirus HKU1 NOT DETECTED NOT DETECTED Final   Coronavirus NL63 NOT DETECTED NOT DETECTED Final   Coronavirus OC43 NOT DETECTED NOT DETECTED Final   Metapneumovirus NOT DETECTED NOT DETECTED Final   Rhinovirus / Enterovirus NOT DETECTED NOT DETECTED Final   Influenza A NOT DETECTED NOT DETECTED Final   Influenza B NOT DETECTED NOT DETECTED Final   Parainfluenza Virus 1 NOT DETECTED NOT DETECTED Final   Parainfluenza Virus 2 NOT DETECTED NOT DETECTED Final   Parainfluenza Virus 3 NOT DETECTED NOT DETECTED Final   Parainfluenza Virus 4 NOT DETECTED NOT DETECTED Final   Respiratory Syncytial Virus NOT DETECTED NOT DETECTED Final   Bordetella pertussis NOT DETECTED NOT DETECTED Final   Bordetella Parapertussis NOT DETECTED NOT DETECTED Final   Chlamydophila pneumoniae NOT DETECTED NOT DETECTED Final    Mycoplasma pneumoniae NOT DETECTED NOT DETECTED Final    Comment: Performed at [facility], [place]. [address], [place], [place] [address]  Expectorated Sputum Assessment w Gram Stain, Rflx to Resp Cult     Status: None   Collection Time: 08/19/21  5:00 PM   Specimen: Sputum  Result Value Ref Range Status   Specimen Description SPUTUM  Final   Special Requests NONE  Final   Sputum evaluation   Final    THIS SPECIMEN IS ACCEPTABLE FOR SPUTUM CULTURE Performed at [facility], [address] [place], [place] [address]    Report Status 08/19/2021 FINAL  Final  Culture, Respiratory w Gram Stain     Status: None (Preliminary result)   Collection Time: 08/19/21  5:00 PM   Specimen: SPU  Result Value Ref Range Status   Specimen Description   Final    SPUTUM Performed at [facility], [address], [place], [place] [address]  Special Requests   Final    NONE Reflexed from [id number] Performed at [facility], [place], [place] [address]    Gram Stain   Final    FEW SQUAMOUS EPITHELIAL CELLS PRESENT MODERATE WBC SEEN FEW GRAM POSITIVE COCCI    Culture   Final    CULTURE REINCUBATED FOR BETTER GROWTH Performed at [facility], [place] [address], [place], [place] [address]    Report Status PENDING  Incomplete      Radiology Studies: No results found.  Scheduled Meds:  amLODipine  10 mg Oral Daily   azithromycin  500 mg Oral Daily   carbidopa-levodopa  1 tablet Oral TID   dextromethorphan-guaiFENesin  1 tablet Oral BID   enoxaparin (LOVENOX) injection  42.5 mg Subcutaneous Q24H   ferrous sulfate  325 mg Oral BID WC   gabapentin  400 mg Oral TID   insulin aspart  0-20 Units Subcutaneous TID WC   insulin aspart  0-5 Units Subcutaneous QHS   insulin aspart  4 Units Subcutaneous TID WC   insulin glargine-yfgn  10 Units Subcutaneous BID   ipratropium-albuterol  3 mL Nebulization TID   mouth rinse  15 mL Mouth Rinse BID    metoprolol tartrate  25 mg Oral BID   multivitamin with minerals  1 tablet Oral Daily   nicotine  21 mg Transdermal Daily   pantoprazole  40 mg Oral Daily   pravastatin  80 mg Oral QPM   predniSONE  40 mg Oral Q breakfast   spironolactone  25 mg Oral Daily   venlafaxine XR  225 mg Oral Q breakfast   cyanocobalamin  1,000 mcg Oral Daily   Continuous Infusions:  sodium chloride 100 mL/hr at 08/21/21 0754   cefTRIAXone (ROCEPHIN)  IV 200 mL/hr at 08/21/21 0623     LOS: 2 days   Time spent: 38 minutes. More than 50% of the time was spent in counseling/coordination of care  Lorella Nimrod, MD [facility]  If 7PM-7AM, please contact night-coverage [email or web address]  08/21/2021, 12:43 PM   This record has been created using Systems analyst. Errors have been sought and corrected,but may not always be located. Such creation errors do not reflect on the standard of care.

## 2021-08-22 LAB — CULTURE, RESPIRATORY W GRAM STAIN: Culture: NORMAL

## 2021-08-22 LAB — GLUCOSE, CAPILLARY
Glucose-Capillary: 161 mg/dL — ABNORMAL HIGH (ref 70–99)
Glucose-Capillary: 134 mg/dL — ABNORMAL HIGH (ref 70–99)

## 2021-08-22 MED ORDER — PREDNISONE 10 MG PO TABS: 40.0000 mg | ORAL_TABLET | Freq: Every day | ORAL | 0 refills | Status: DC

## 2021-08-22 MED ORDER — METFORMIN HCL 500 MG PO TABS
500.0000 mg | ORAL_TABLET | Freq: Two times a day (BID) | ORAL | 1 refills | Status: DC
Start: 2021-08-22 — End: 2022-09-20

## 2021-08-22 MED ORDER — DM-GUAIFENESIN ER 30-600 MG PO TB12: 1.0000 | ORAL_TABLET | Freq: Two times a day (BID) | ORAL | 1 refills | Status: DC

## 2021-08-22 MED ORDER — ONDANSETRON HCL 4 MG PO TABS: 4.0000 mg | ORAL_TABLET | Freq: Four times a day (QID) | ORAL | 0 refills | Status: DC | PRN

## 2021-08-22 MED ORDER — ENSURE ENLIVE PO LIQD: 237.0000 mL | Freq: Two times a day (BID) | ORAL | 12 refills | Status: AC

## 2021-08-22 NOTE — Progress Notes (Signed)
Pt is discharging home, discharge education completed.

## 2021-08-22 NOTE — Care Management Important Message (Signed)
Important Message  Patient Details  Name: Suzanne Glenn MRN: [id number] Date of Birth: 1951-01-27   Medicare Important Message Given:  N/A - LOS <3 / Initial given by admissions     Dannette Barbara 08/22/2021, 2:48 PM

## 2021-08-22 NOTE — Discharge Summary (Signed)
Physician Discharge Summary  URI COVEY TGY:[id number] DOB: [birth year] DOA: 08/19/2021  PCP: Tracie Harrier, MD  Admit date: 08/19/2021 Discharge date: 08/22/2021  Admitted From: Home Disposition: Home  Recommendations for Outpatient Follow-up:  Follow up with PCP in 1-2 weeks Follow-up with pulmonology in 1 week Please obtain BMP/CBC in one week Please follow up on the following pending results: None  Home Health: No Equipment/Devices: Home oxygen Discharge Condition: Stable CODE STATUS: Full Diet recommendation: Heart Healthy / Carb Modified   Brief/Interim Summary: Suzanne Glenn is a 70 y.o. Caucasian female with medical history significant for asthma, COPD, type diabetes mellitus, hypertension and lung cancer as well as chronic respiratory failure on home O2 at 2 L/min, who presented to the ER with acute onset of worsening dyspnea over the last 4 days with associated cough productive of green sputum and chest congestion.  She admitted to associated fever and chills as well as malaise and night sweats.  She has been having mild sore throat.  She was saturating in low 80s on 3 L of oxygen when came to ED, oxygenation improved by placing her on 4 L of oxygen.   Labs pertinent for mild leukocytosis, thrombocytosis with neutrophilia.  Influenza antigen and COVID-19 PCR came back negative.  Chest x-ray with chronic increased interstitial markings with mild stable mid left flank and left base scarring and/or atelectasis. CTA was negative for PE but did show some concern of bronchitis.   She was admitted for acute on chronic hypoxic respiratory failure secondary to COPD exacerbation, as she was requiring more oxygen than her baseline.  At baseline she normally uses 2 to 3 L of oxygen.  Respiratory viral panel was negative and procalcitonin was at 0.16.  She received steroid along with Zithromax and ceftriaxone while in the hospital and discharged on a tapering dose of ceftriaxone.   No more antibiotics needed.  Patient has well-controlled diabetes on a small dose of metformin at home.  We added basal and short-acting insulin while in the hospital as she was on steroid.  We increased the dose of metformin to twice daily on discharge and she can follow-up with her primary care provider for further recommendations.  She will continue the rest of her home medications and follow-up with her providers.  Discharge Diagnoses:  Active Problems:   COPD exacerbation ([place])   Acute on chronic respiratory failure [facility])   Discharge Instructions  Discharge Instructions     Diet - low sodium heart healthy   Complete by: As directed    Discharge instructions   Complete by: As directed    It was pleasure taking care of you. We increased the dose of metformin to twice daily as you are on steroids which can increase the blood glucose level. Take 4 tablets of prednisone daily for the next 3 days, then decrease to 3 tablets for 3 days, followed by decreasing to 2 tablets for 3 days and then 1 tablet for another 3 days before stopping it. Follow-up with your doctors for further recommendations.   Increase activity slowly   Complete by: As directed       Allergies as of 08/22/2021       Reactions   Diclofenac-misoprostol Other (See Comments)   Arthrotec - gastritis    Nsaids Other (See Comments)   gastritis   Zolpidem Other (See Comments)   Sleep walking    Hydrocodone-acetaminophen Nausea And Vomiting   Simvastatin Other (See Comments)   body aches  Medication List     TAKE these medications    Accu-Chek FastClix Lancets Misc   Accu-Chek SmartView test strip Generic drug: glucose blood   albuterol 108 (90 Base) MCG/ACT inhaler Commonly known as: VENTOLIN HFA Inhale 2 puffs into the lungs every 4 (four) hours as needed for wheezing or shortness of breath.   amLODipine 10 MG tablet Commonly known as: NORVASC Take 10 mg by mouth daily.    carbidopa-levodopa 25-100 MG tablet Commonly known as: SINEMET IR Take 1 tablet by mouth 3 (three) times daily.   cyanocobalamin 1000 MCG tablet Take 1 tablet by mouth daily.   dextromethorphan-guaiFENesin 30-600 MG 12hr tablet Commonly known as: MUCINEX DM Take 1 tablet by mouth 2 (two) times daily.   esomeprazole 40 MG capsule Commonly known as: NEXIUM Take 40 mg by mouth daily.   Eszopiclone 3 MG Tabs Commonly known as: eszopiclone Take 1 tablet (3 mg total) by mouth at bedtime as needed. Take immediately before bedtime   feeding supplement Liqd Take 237 mLs by mouth 2 (two) times daily between meals.   ferrous sulfate 325 (65 FE) MG tablet Take 1 tablet (325 mg total) by mouth 2 (two) times daily with a meal.   Fluticasone-Umeclidin-Vilant 100-62.5-25 MCG/INH Aepb Inhale 1 puff into the lungs daily. Treligy   gabapentin 400 MG capsule Commonly known as: NEURONTIN Take 1 capsule (400 mg total) by mouth 3 (three) times daily.   ipratropium-albuterol 0.5-2.5 (3) MG/3ML Soln Commonly known as: DUONEB Take 3 mLs by nebulization every 6 (six) hours.   metFORMIN 500 MG tablet Commonly known as: GLUCOPHAGE Take 1 tablet (500 mg total) by mouth 2 (two) times daily with a meal. What changed: when to take this   metoprolol tartrate 25 MG tablet Commonly known as: LOPRESSOR Take 25 mg by mouth 2 (two) times daily.   multivitamin tablet Take 1 tablet by mouth daily.   nicotine 21 mg/24hr patch Commonly known as: NICODERM CQ - dosed in mg/24 hours Place 1 patch (21 mg total) onto the skin daily.   ondansetron 4 MG tablet Commonly known as: ZOFRAN Take 1 tablet (4 mg total) by mouth every 6 (six) hours as needed for nausea.   OXYGEN Inhale 2 L into the lungs continuous.   pravastatin 80 MG tablet Commonly known as: PRAVACHOL Take 80 mg by mouth every evening.   predniSONE 10 MG tablet Commonly known as: DELTASONE Take 4 tablets (40 mg total) by mouth daily with  breakfast. For 3 days, then take 3 tablets for next 3 days, 2 tablets for the following 3 days, 1 tablet for 3 days and then stop it Start taking on: August 23, 2021   spironolactone 25 MG tablet Commonly known as: ALDACTONE Take 25 mg by mouth daily.   venlafaxine XR 150 MG 24 hr capsule Commonly known as: EFFEXOR-XR Take total of 225 mg daily, take along with 75 mg cap   venlafaxine XR 75 MG 24 hr capsule Commonly known as: EFFEXOR-XR Take total of 225 mg daily, take along with 150 mg cap        Follow-up Information     Hande, Vishwanath, MD. Schedule an appointment as soon as possible for a visit in 1 week(s).   Specialty: Internal Medicine Contact information: [place] [address] [phone]                Allergies  Allergen Reactions   Diclofenac-Misoprostol Other (See Comments)  Arthrotec - gastritis    Nsaids Other (See Comments)    gastritis   Zolpidem Other (See Comments)    Sleep walking    Hydrocodone-Acetaminophen Nausea And Vomiting   Simvastatin Other (See Comments)    body aches    Consultations: None  Procedures/Studies: DG Chest 2 View  Result Date: 08/18/2021 CLINICAL DATA:  Productive cough and shortness of breath. EXAM: CHEST - 2 VIEW COMPARISON:  April 14, 2021 FINDINGS: Dominant stable mild to moderate severity diffuse chronic appearing increased interstitial lung markings are seen. Mild, stable areas of atelectasis and/or scarring are seen within the mid left lung and left lung base. There is no evidence of a pleural effusion or pneumothorax. The heart size and mediastinal contours are within normal limits. A radiopaque fusion plate and screws are seen overlying the lower cervical spine. Degenerative changes are noted throughout the thoracic spine. IMPRESSION: Chronic appearing increased interstitial lung markings with mild, stable mid left lung and left basilar scarring and/or atelectasis.  Electronically Signed   By: Virgina Norfolk M.D.   On: 08/18/2021 17:14   CT Chest W Contrast  Result Date: 07/26/2021 CLINICAL DATA:  Non-small cell lung cancer, restaging. EXAM: CT CHEST WITH CONTRAST TECHNIQUE: Multidetector CT imaging of the chest was performed during intravenous contrast administration. CONTRAST:  48mL OMNIPAQUE IOHEXOL 350 MG/ML SOLN COMPARISON:  03/19/2021 FINDINGS: Cardiovascular: Heart size normal. Trace pericardial fluid identified, unchanged. Aortic atherosclerosis. Coronary artery calcifications. Mediastinum/Nodes: Normal appearance of the thyroid gland. The trachea appears patent and is midline. Normal appearance of the esophagus. No enlarged mediastinal or hilar lymph nodes. Lungs/Pleura: No pleural effusion identified. Loculated fluid along the left lung base is again identified and appears stable from previous exam. Fibrosis, volume loss and masslike architectural distortion within the lingula is appears unchanged from the previous exam, image 62/3. New small volume of loculated fluid is identified overlying the lingula, image 60/2. Interval improvement in diffuse bronchial wall thickening and scattered tree-in-bud nodularity compatible with resolving inflammatory/infectious bronchiolitis. Left upper lobe lung nodule is unchanged measuring 5 mm, image 18/2. Upper Abdomen: No acute abnormality. Low-attenuation structure within right lobe of liver measures 8 mm and is similar to previous exam. Unchanged appearance of left adrenal nodules, image 147/2. These are compatible with benign adenomas. Musculoskeletal: No chest wall abnormality. No acute or significant osseous findings. IMPRESSION: 1. Interval improvement in diffuse bronchial wall thickening and scattered tree-in-bud nodularity compatible with resolving inflammatory/infectious bronchiolitis. 2. Post treatment changes within the lingula are again noted including masslike architectural distortion, volume loss and fibrosis.  There is a new focal area of loculated fluid overlying the lingula when compared with 03/19/2021. 3. Focal area of loculated pleural fluid along the left base is unchanged. 4. Stable left adrenal nodules. 5. Aortic Atherosclerosis (ICD10-I70.0). Electronically Signed   By: Kerby Moors M.D.   On: 07/26/2021 10:29   CT Angio Chest PE W and/or Wo Contrast  Result Date: 08/19/2021 CLINICAL DATA:  Dyspnea, night sweats, malaise, hypoxia. PE suspected, high prob. 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 EXAM: CT ANGIOGRAPHY CHEST WITH CONTRAST TECHNIQUE: Multidetector CT imaging of the chest was performed using the standard protocol during bolus administration of intravenous contrast. Multiplanar CT image reconstructions and MIPs were obtained to evaluate the vascular anatomy. CONTRAST:  48mL OMNIPAQUE IOHEXOL 350 MG/ML SOLN COMPARISON:  07/26/2021 chest CT. FINDINGS: Cardiovascular: The study is high quality for the evaluation of pulmonary embolism. There are no filling defects  in the central, lobar, segmental or subsegmental pulmonary artery branches to suggest acute pulmonary embolism. Atherosclerotic nonaneurysmal thoracic aorta. Normal caliber pulmonary arteries. Normal heart size. Stable trace pericardial effusion/thickening. Three-vessel coronary atherosclerosis. Mediastinum/Nodes: No discrete thyroid nodules. Unremarkable esophagus. No axillary adenopathy. Newly enlarged 1.5 cm subcarinal lymph node (series 5/image 42). No hilar adenopathy. Lungs/Pleura: No pneumothorax. No pleural effusion. Mild centrilobular emphysema. Mild to moderate patchy tree-in-bud opacities at the left greater than right lung bases with scattered mucoid impaction, new/increased. Chronic sharply marginated consolidation, distortion and volume loss in the lingula is unchanged. No acute consolidative airspace disease. No new significant pulmonary nodules. Upper abdomen:  Cholecystectomy. Stable subcentimeter hypodense right liver lesion, too small to characterize. Stable 1.1 cm left adrenal nodule with density -5 HU, compatible with benign adenoma. Musculoskeletal: No aggressive appearing focal osseous lesions. Moderate thoracic spondylosis. Partially visualized surgical hardware from ACDF. Review of the MIP images confirms the above findings. IMPRESSION: 1. No pulmonary embolism. 2. Mild to moderate patchy tree-in-bud opacities at the left greater than right lung bases with scattered mucoid impaction, new/increased, compatible with nonspecific mild infectious or inflammatory bronchiolitis, with the differential including aspiration. 3. Stable chronic sharply marginated consolidation, distortion and volume loss in the lingula, compatible with chronic post treatment change. 4. New mild subcarinal lymphadenopathy, nonspecific. Attention on follow-up chest CT with IV contrast recommended in 3 months. 5. Three-vessel coronary atherosclerosis. 6. Stable left adrenal adenoma. 7. Aortic Atherosclerosis (ICD10-I70.0) and Emphysema (ICD10-J43.9). Electronically Signed   By: Ilona Sorrel M.D.   On: 08/19/2021 06:34    Subjective: Patient was feeling much improved when seen during morning rounds.  Having some mild nausea with no vomiting.  Shortness of breath at baseline.  Discussed about the slow taper of prednisone and follow-up with his pulmonologist for further recommendations.  Discharge Exam: Vitals:   08/22/21 0518 08/22/21 0737  BP: (!) 143/88 133/70  Pulse: 88 91  Resp: (!) 24 16  Temp: 98.3 F (36.8 C) 98.5 F (36.9 C)  SpO2: 95% 94%   Vitals:   08/21/21 2015 08/22/21 0428 08/22/21 0518 08/22/21 0737  BP: (!) 152/77 130/90 (!) 143/88 133/70  Pulse: 100 77 88 91  Resp: 16 16 (!) 24 16  Temp: 98.6 F (37 C) 98 F (36.7 C) 98.3 F (36.8 C) 98.5 F (36.9 C)  TempSrc: Oral Oral Oral Oral  SpO2: 95% 95% 95% 94%  Weight:      Height:        General: Pt is  alert, awake, not in acute distress Cardiovascular: RRR, S1/S2 +, no rubs, no gallops Respiratory: CTA bilaterally, no wheezing, no rhonchi Abdominal: Soft, NT, ND, bowel sounds + Extremities: no edema, no cyanosis   The results of significant diagnostics from this hospitalization (including imaging, microbiology, ancillary and laboratory) are listed below for reference.    Microbiology: Recent Results (from the past 240 hour(s))  Resp Panel by RT-PCR (Flu A&B, Covid) Nasopharyngeal Swab     Status: None   Collection Time: 08/18/21  4:32 PM   Specimen: Nasopharyngeal Swab; Nasopharyngeal(NP) swabs in vial transport medium  Result Value Ref Range Status   SARS Coronavirus 2 by RT PCR NEGATIVE NEGATIVE Final    Comment: (NOTE) SARS-CoV-2 target nucleic acids are NOT DETECTED.  The SARS-CoV-2 RNA is generally detectable in upper respiratory specimens during the acute phase of infection. The lowest concentration of SARS-CoV-2 viral copies this assay can detect is 138 copies/mL. A negative result does not preclude SARS-Cov-2 infection and should  not be used as the sole basis for treatment or other patient management decisions. A negative result may occur with  improper specimen collection/handling, submission of specimen other than nasopharyngeal swab, presence of viral mutation(s) within the areas targeted by this assay, and inadequate number of viral copies(<138 copies/mL). A negative result must be combined with clinical observations, patient history, and epidemiological information. The expected result is Negative.  Fact Sheet for Patients:  EntrepreneurPulse.com.au  Fact Sheet for Healthcare Providers:  IncredibleEmployment.be  This test is no t yet approved or cleared by the [place] FDA and  has been authorized for detection and/or diagnosis of SARS-CoV-2 by FDA under an Emergency Use Authorization (EUA). This EUA will remain  in  effect (meaning this test can be used) for the duration of the COVID-19 declaration under Section 564(b)(1) of the Act, 21 U.S.C.section 360bbb-3(b)(1), unless the authorization is terminated  or revoked sooner.       Influenza A by PCR NEGATIVE NEGATIVE Final   Influenza B by PCR NEGATIVE NEGATIVE Final    Comment: (NOTE) The Xpert Xpress SARS-CoV-2/FLU/RSV plus assay is intended as an aid in the diagnosis of influenza from Nasopharyngeal swab specimens and should not be used as a sole basis for treatment. Nasal washings and aspirates are unacceptable for Xpert Xpress SARS-CoV-2/FLU/RSV testing.  Fact Sheet for Patients: EntrepreneurPulse.com.au  Fact Sheet for Healthcare Providers: IncredibleEmployment.be  This test is not yet approved or cleared by the [place] FDA and has been authorized for detection and/or diagnosis of SARS-CoV-2 by FDA under an Emergency Use Authorization (EUA). This EUA will remain in effect (meaning this test can be used) for the duration of the COVID-19 declaration under Section 564(b)(1) of the Act, 21 U.S.C. section 360bbb-3(b)(1), unless the authorization is terminated or revoked.  Performed at [facility], [place], [place] [address]   Respiratory (~20 pathogens) panel by PCR     Status: None   Collection Time: 08/18/21  4:32 PM   Specimen: Nasopharyngeal Swab; Respiratory  Result Value Ref Range Status   Adenovirus NOT DETECTED NOT DETECTED Final   Coronavirus 229E NOT DETECTED NOT DETECTED Final    Comment: (NOTE) The Coronavirus on the Respiratory Panel, DOES NOT test for the novel  Coronavirus (2019 nCoV)    Coronavirus HKU1 NOT DETECTED NOT DETECTED Final   Coronavirus NL63 NOT DETECTED NOT DETECTED Final   Coronavirus OC43 NOT DETECTED NOT DETECTED Final   Metapneumovirus NOT DETECTED NOT DETECTED Final   Rhinovirus / Enterovirus NOT DETECTED NOT DETECTED Final    Influenza A NOT DETECTED NOT DETECTED Final   Influenza B NOT DETECTED NOT DETECTED Final   Parainfluenza Virus 1 NOT DETECTED NOT DETECTED Final   Parainfluenza Virus 2 NOT DETECTED NOT DETECTED Final   Parainfluenza Virus 3 NOT DETECTED NOT DETECTED Final   Parainfluenza Virus 4 NOT DETECTED NOT DETECTED Final   Respiratory Syncytial Virus NOT DETECTED NOT DETECTED Final   Bordetella pertussis NOT DETECTED NOT DETECTED Final   Bordetella Parapertussis NOT DETECTED NOT DETECTED Final   Chlamydophila pneumoniae NOT DETECTED NOT DETECTED Final   Mycoplasma pneumoniae NOT DETECTED NOT DETECTED Final    Comment: Performed at [facility], [place]. [address] [place], [place] [address]  Expectorated Sputum Assessment w Gram Stain, Rflx to Resp Cult     Status: None   Collection Time: 08/19/21  5:00 PM   Specimen: Sputum  Result Value Ref Range Status   Specimen Description SPUTUM  Final   Special Requests NONE  Final   Sputum evaluation   Final    THIS SPECIMEN IS ACCEPTABLE FOR SPUTUM CULTURE Performed at [facility], [address] [place], [place] [address]    Report Status 08/19/2021 FINAL  Final  Culture, Respiratory w Gram Stain     Status: None   Collection Time: 08/19/21  5:00 PM   Specimen: SPU  Result Value Ref Range Status   Specimen Description   Final    SPUTUM Performed at [facility], [address], [place], [place] [address]    Special Requests   Final    NONE Reflexed from [id number] Performed at [facility], [address] [place], [place] [address]    Gram Stain   Final    FEW SQUAMOUS EPITHELIAL CELLS PRESENT MODERATE WBC SEEN FEW GRAM POSITIVE COCCI    Culture   Final    RARE Normal respiratory flora-no Staph aureus or Pseudomonas seen Performed at [facility], [place] [address], [place], [place] [address]    Report Status 08/22/2021 FINAL  Final     Labs: BNP (last 3 results) Recent Labs    03/20/21 1722  04/14/21 1300  BNP 505.5* 967.8*   Basic Metabolic Panel: Recent Labs  Lab 08/18/21 1630 08/20/21 0433  NA 138 138  K 4.2 4.9  CL 91* 95*  CO2 35* 38*  GLUCOSE 131* 152*  BUN 19 15  CREATININE 0.70 0.66  CALCIUM 9.7 8.9   Liver Function Tests: Recent Labs  Lab 08/18/21 1630  AST 31  ALT 23  ALKPHOS 96  BILITOT 0.4  PROT 7.7  ALBUMIN 3.2*   No results for input(s): LIPASE, AMYLASE in the last 168 hours. No results for input(s): AMMONIA in the last 168 hours. CBC: Recent Labs  Lab 08/18/21 1630 08/20/21 0433  WBC 11.8* 16.0*  NEUTROABS 9.0*  --   HGB 11.8* 10.0*  HCT 37.8 33.6*  MCV 93.8 95.2  PLT 417* 413*   Cardiac Enzymes: No results for input(s): CKTOTAL, CKMB, CKMBINDEX, TROPONINI in the last 168 hours. BNP: Invalid input(s): POCBNP CBG: Recent Labs  Lab 08/21/21 1151 08/21/21 1712 08/21/21 2111 08/22/21 0736 08/22/21 1131  GLUCAP 183* 231* 174* 161* 134*   D-Dimer No results for input(s): DDIMER in the last 72 hours. Hgb A1c Recent Labs    08/20/21 0433  HGBA1C 6.8*   Lipid Profile No results for input(s): CHOL, HDL, LDLCALC, TRIG, CHOLHDL, LDLDIRECT in the last 72 hours. Thyroid function studies No results for input(s): TSH, T4TOTAL, T3FREE, THYROIDAB in the last 72 hours.  Invalid input(s): FREET3 Anemia work up No results for input(s): VITAMINB12, FOLATE, FERRITIN, TIBC, IRON, RETICCTPCT in the last 72 hours. Urinalysis    Component Value Date/Time   COLORURINE YELLOW (A) 08/18/2021 1632   APPEARANCEUR HAZY (A) 08/18/2021 1632   LABSPEC >1.046 (H) 08/18/2021 1632   PHURINE 5.0 08/18/2021 1632   GLUCOSEU NEGATIVE 08/18/2021 1632   HGBUR NEGATIVE 08/18/2021 1632   BILIRUBINUR NEGATIVE 08/18/2021 1632   KETONESUR 5 (A) 08/18/2021 1632   PROTEINUR NEGATIVE 08/18/2021 1632   NITRITE NEGATIVE 08/18/2021 1632   LEUKOCYTESUR NEGATIVE 08/18/2021 1632   Sepsis Labs Invalid input(s): PROCALCITONIN,  WBC,   LACTICIDVEN Microbiology Recent Results (from the past 240 hour(s))  Resp Panel by RT-PCR (Flu A&B, Covid) Nasopharyngeal Swab     Status: None   Collection Time: 08/18/21  4:32 PM   Specimen: Nasopharyngeal Swab; Nasopharyngeal(NP) swabs in vial  transport medium  Result Value Ref Range Status   SARS Coronavirus 2 by RT PCR NEGATIVE NEGATIVE Final    Comment: (NOTE) SARS-CoV-2 target nucleic acids are NOT DETECTED.  The SARS-CoV-2 RNA is generally detectable in upper respiratory specimens during the acute phase of infection. The lowest concentration of SARS-CoV-2 viral copies this assay can detect is 138 copies/mL. A negative result does not preclude SARS-Cov-2 infection and should not be used as the sole basis for treatment or other patient management decisions. A negative result may occur with  improper specimen collection/handling, submission of specimen other than nasopharyngeal swab, presence of viral mutation(s) within the areas targeted by this assay, and inadequate number of viral copies(<138 copies/mL). A negative result must be combined with clinical observations, patient history, and epidemiological information. The expected result is Negative.  Fact Sheet for Patients:  EntrepreneurPulse.com.au  Fact Sheet for Healthcare Providers:  IncredibleEmployment.be  This test is no t yet approved or cleared by the [place] FDA and  has been authorized for detection and/or diagnosis of SARS-CoV-2 by FDA under an Emergency Use Authorization (EUA). This EUA will remain  in effect (meaning this test can be used) for the duration of the COVID-19 declaration under Section 564(b)(1) of the Act, 21 U.S.C.section 360bbb-3(b)(1), unless the authorization is terminated  or revoked sooner.       Influenza A by PCR NEGATIVE NEGATIVE Final   Influenza B by PCR NEGATIVE NEGATIVE Final    Comment: (NOTE) The Xpert Xpress SARS-CoV-2/FLU/RSV plus  assay is intended as an aid in the diagnosis of influenza from Nasopharyngeal swab specimens and should not be used as a sole basis for treatment. Nasal washings and aspirates are unacceptable for Xpert Xpress SARS-CoV-2/FLU/RSV testing.  Fact Sheet for Patients: EntrepreneurPulse.com.au  Fact Sheet for Healthcare Providers: IncredibleEmployment.be  This test is not yet approved or cleared by the [place] FDA and has been authorized for detection and/or diagnosis of SARS-CoV-2 by FDA under an Emergency Use Authorization (EUA). This EUA will remain in effect (meaning this test can be used) for the duration of the COVID-19 declaration under Section 564(b)(1) of the Act, 21 U.S.C. section 360bbb-3(b)(1), unless the authorization is terminated or revoked.  Performed at [facility], [place], [place] [address]   Respiratory (~20 pathogens) panel by PCR     Status: None   Collection Time: 08/18/21  4:32 PM   Specimen: Nasopharyngeal Swab; Respiratory  Result Value Ref Range Status   Adenovirus NOT DETECTED NOT DETECTED Final   Coronavirus 229E NOT DETECTED NOT DETECTED Final    Comment: (NOTE) The Coronavirus on the Respiratory Panel, DOES NOT test for the novel  Coronavirus (2019 nCoV)    Coronavirus HKU1 NOT DETECTED NOT DETECTED Final   Coronavirus NL63 NOT DETECTED NOT DETECTED Final   Coronavirus OC43 NOT DETECTED NOT DETECTED Final   Metapneumovirus NOT DETECTED NOT DETECTED Final   Rhinovirus / Enterovirus NOT DETECTED NOT DETECTED Final   Influenza A NOT DETECTED NOT DETECTED Final   Influenza B NOT DETECTED NOT DETECTED Final   Parainfluenza Virus 1 NOT DETECTED NOT DETECTED Final   Parainfluenza Virus 2 NOT DETECTED NOT DETECTED Final   Parainfluenza Virus 3 NOT DETECTED NOT DETECTED Final   Parainfluenza Virus 4 NOT DETECTED NOT DETECTED Final   Respiratory Syncytial Virus NOT DETECTED NOT DETECTED  Final   Bordetella pertussis NOT DETECTED NOT DETECTED Final   Bordetella Parapertussis NOT DETECTED NOT DETECTED Final   Chlamydophila pneumoniae  NOT DETECTED NOT DETECTED Final   Mycoplasma pneumoniae NOT DETECTED NOT DETECTED Final    Comment: Performed at [facility], [place] [address], [place], [place] [address]  Expectorated Sputum Assessment w Gram Stain, Rflx to Resp Cult     Status: None   Collection Time: 08/19/21  5:00 PM   Specimen: Sputum  Result Value Ref Range Status   Specimen Description SPUTUM  Final   Special Requests NONE  Final   Sputum evaluation   Final    THIS SPECIMEN IS ACCEPTABLE FOR SPUTUM CULTURE Performed at [facility], [address], [place], [place] [address]    Report Status 08/19/2021 FINAL  Final  Culture, Respiratory w Gram Stain     Status: None   Collection Time: 08/19/21  5:00 PM   Specimen: SPU  Result Value Ref Range Status   Specimen Description   Final    SPUTUM Performed at [facility], [address] [place], [place] [address]    Special Requests   Final    NONE Reflexed from [id number] Performed at [facility], [place] [place], [place] [address]    Gram Stain   Final    FEW SQUAMOUS EPITHELIAL CELLS PRESENT MODERATE WBC SEEN FEW GRAM POSITIVE COCCI    Culture   Final    RARE Normal respiratory flora-no Staph aureus or Pseudomonas seen Performed at [facility], [place] [address] [place], [place] [address]    Report Status 08/22/2021 FINAL  Final    Time coordinating discharge: Over 30 minutes  SIGNED:  Lorella Nimrod, MD  [facility] 08/22/2021, 11:36 AM  If 7PM-7AM, please contact night-coverage [email or web address]  This record has been created using Systems analyst. Errors have been sought and corrected,but may not always be located. Such creation errors do not reflect on the standard of care.

## 2021-08-22 NOTE — TOC Transition Note (Signed)
Transition of Care [facility]) - CM/SW Discharge Note   Patient Details  Name: Suzanne Glenn MRN: [id number] Date of Birth: 1951/05/19  Transition of [facility] CM/SW Contact:  Beverly Sessions, RN Phone Number: 08/22/2021, 12:20 PM   Clinical Narrative:     Patient to discharge today on her home o2.  Per MD no indication for home health services at discharge.     Barriers to Discharge: Continued Medical Work up   Patient Goals and CMS Choice Patient states their goals for this hospitalization and ongoing recovery are:: home with spouse CMS Medicare.gov Compare Post Acute Care list provided to:: Patient Represenative (must comment) Choice offered to / list presented to : Spouse  Discharge Placement                       Discharge Plan and Services                                     Social Determinants of Health (SDOH) Interventions     Readmission Risk Interventions Readmission Risk Prevention Plan 08/22/2021 08/20/2021 03/21/2021  Transportation Screening Complete Complete Complete  PCP or Specialist Appt within 3-5 Days - Complete Complete  HRI or [place] - Complete -  Social Work Consult for [place] Planning/Counseling - Complete Complete  Palliative Care Screening - Not Applicable Not Applicable  Medication Review Press photographer) - Complete Complete  SW Recovery Care/Counseling Consult Complete - -  Palliative Care Screening Not Applicable - -  [place] Not Applicable - -  Some recent data might be hidden

## 2021-08-29 ENCOUNTER — Other Ambulatory Visit: Payer: Self-pay | Admitting: Psychiatry

## 2021-08-29 DIAGNOSIS — Z09 Encounter for follow-up examination after completed treatment for conditions other than malignant neoplasm: Secondary | ICD-10-CM | POA: Diagnosis not present

## 2021-08-29 DIAGNOSIS — F3341 Major depressive disorder, recurrent, in partial remission: Secondary | ICD-10-CM

## 2021-08-29 DIAGNOSIS — E119 Type 2 diabetes mellitus without complications: Secondary | ICD-10-CM | POA: Diagnosis not present

## 2021-08-29 DIAGNOSIS — J9611 Chronic respiratory failure with hypoxia: Secondary | ICD-10-CM | POA: Diagnosis not present

## 2021-09-01 ENCOUNTER — Ambulatory Visit: Payer: Medicare HMO

## 2021-09-01 ENCOUNTER — Other Ambulatory Visit: Payer: Self-pay | Admitting: Psychiatry

## 2021-09-04 NOTE — Progress Notes (Signed)
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  INTERVAL HISTORY: Patient returns to clinic today for further evaluation and discussion of her imaging results.  She was recently admitted to the hospital with a COPD exasperation.  She continues to have chronic shortness of breath, but is back to her baseline.  She requires oxygen 24 hours a day. She has no neurologic complaints.  She denies any recent fevers or illnesses.  She has a good appetite and denies weight loss.  She denies any pain.  She has no chest pain, cough, or hemoptysis.  She denies any nausea, vomiting, constipation, or diarrhea.  She has no urinary complaints.  Patient offers no further specific complaints today.  REVIEW OF SYSTEMS:   Review of Systems  Constitutional: Negative.  Negative for fever, malaise/fatigue and weight loss.  Respiratory:  Positive for shortness of breath. Negative for cough and hemoptysis.   Cardiovascular: Negative.  Negative for chest pain and leg swelling.  Gastrointestinal: Negative.  Negative for abdominal pain.  Genitourinary: Negative.  Negative for dysuria.  Musculoskeletal: Negative.  Negative for back pain.  Skin: Negative.  Negative for rash.  Neurological: Negative.  Negative for dizziness, weakness and headaches.  Psychiatric/Behavioral: Negative.  The patient is not nervous/anxious.    As per HPI. Otherwise, a complete review of systems is negative.  PAST MEDICAL HISTORY: Past Medical History:  Diagnosis Date   Anxiety    Asthma    Cancer ([place]) 12/2018   w/u for right upper lobe  mass/cancer   COPD (chronic obstructive pulmonary disease) (HCC)    also, emphysema. now using o2 via np 24 hours a day   Coronary artery disease    Depression    Diabetes mellitus, type II (HCC)    GERD (gastroesophageal reflux disease)    Headache    migraines in early 20's   Heart murmur    Hypertension    Lung cancer ([place])    Neuropathy    Restless leg     PAST SURGICAL HISTORY: Past Surgical History:  Procedure Laterality Date   BACK SURGERY  2005   surgery x 2, disc fused in neck, pinched nerve in center of back and neck   CARDIAC CATHETERIZATION  2015   1 stent placed for blockage   cervical bone infusion     CHOLECYSTECTOMY     THORACIC DISC SURGERY     TUBAL LIGATION     VIDEO BRONCHOSCOPY WITH ENDOBRONCHIAL ULTRASOUND N/A 03/06/2019   Procedure: VIDEO BRONCHOSCOPY WITH ENDOBRONCHIAL ULTRASOUND - DIABETIC;  Surgeon: Ottie Glazier, MD;  Location: [facility] ORS;  Service: Thoracic;  Laterality: N/A;    FAMILY HISTORY: Family History  Problem Relation Age of Onset   Anxiety disorder Mother    Cancer - Lung Mother    Depression Sister    Cancer Sister    Cancer Sister    Cancer Sister    Cancer Sister    Heart Problems Sister    Bipolar disorder Sister    Diabetes Sister    Cancer - Lung Sister    Hypertension Sister    Diabetes Sister    Hyperlipidemia Sister  COPD Sister    Depression Brother    Heart Problems Brother    Cancer Brother    Cancer Brother    Cancer Brother    Breast cancer Maternal Aunt    Depression Son     ADVANCED DIRECTIVES (Y/N):  N  HEALTH MAINTENANCE: Social History   Tobacco Use   Smoking status: Every Day    Packs/day: 1.00    Years: 40.00    Pack years: 40.00    Types: Cigarettes    Start date: 05/05/1975   Smokeless tobacco: Never  Vaping Use   Vaping Use: Never used  Substance Use Topics   Alcohol use: No    Alcohol/week: 0.0 standard drinks    Comment: socially   Drug use: No     Colonoscopy:  PAP:  Bone  density:  Lipid panel:  Allergies  Allergen Reactions   Diclofenac-Misoprostol Other (See Comments)    Arthrotec - gastritis    Nsaids Other (See Comments)    gastritis   Zolpidem Other (See Comments)    Sleep walking    Hydrocodone-Acetaminophen Nausea And Vomiting   Simvastatin Other (See Comments)    body aches    Current Outpatient Medications  Medication Sig Dispense Refill   ACCU-CHEK FASTCLIX LANCETS MISC      ACCU-CHEK SMARTVIEW test strip      albuterol (VENTOLIN HFA) 108 (90 Base) MCG/ACT inhaler Inhale 2 puffs into the lungs every 4 (four) hours as needed for wheezing or shortness of breath.      amLODipine (NORVASC) 10 MG tablet Take 10 mg by mouth daily.      carbidopa-levodopa (SINEMET IR) 25-100 MG tablet Take 1 tablet by mouth 3 (three) times daily.     dextromethorphan-guaiFENesin (MUCINEX DM) 30-600 MG 12hr tablet Take 1 tablet by mouth 2 (two) times daily. 30 tablet 1   esomeprazole (NEXIUM) 40 MG capsule Take 40 mg by mouth daily.     Eszopiclone 3 MG TABS Take 1 tablet (3 mg total) by mouth at bedtime as needed. Take immediately before bedtime 30 tablet 0   feeding supplement (ENSURE ENLIVE / ENSURE PLUS) LIQD Take 237 mLs by mouth 2 (two) times daily between meals. 237 mL 12   Fluticasone-Umeclidin-Vilant 100-62.5-25 MCG/INH AEPB Inhale 1 puff into the lungs daily. Treligy     gabapentin (NEURONTIN) 400 MG capsule Take 1 capsule (400 mg total) by mouth 3 (three) times daily. 270 capsule 0   ipratropium-albuterol (DUONEB) 0.5-2.5 (3) MG/3ML SOLN Take 3 mLs by nebulization every 6 (six) hours. 360 mL 0   metFORMIN (GLUCOPHAGE) 500 MG tablet Take 1 tablet (500 mg total) by mouth 2 (two) times daily with a meal. 60 tablet 1   metoprolol tartrate (LOPRESSOR) 25 MG tablet Take 25 mg by mouth 2 (two) times daily.      Multiple Vitamin (MULTIVITAMIN) tablet Take 1 tablet by mouth daily.     nicotine (NICODERM CQ - DOSED IN MG/24 HOURS) 21 mg/24hr patch Place 1 patch (21  mg total) onto the skin daily. 28 patch 0   ondansetron (ZOFRAN) 4 MG tablet Take 1 tablet (4 mg total) by mouth every 6 (six) hours as needed for nausea. 20 tablet 0   OXYGEN Inhale 2 L into the lungs continuous.     pravastatin (PRAVACHOL) 80 MG tablet Take 80 mg by mouth every evening.      predniSONE (DELTASONE) 10 MG tablet Take 4 tablets (40 mg total) by mouth daily with breakfast. For  3 days, then take 3 tablets for next 3 days, 2 tablets for the following 3 days, 1 tablet for 3 days and then stop it 40 tablet 0   spironolactone (ALDACTONE) 25 MG tablet Take 25 mg by mouth daily.      venlafaxine XR (EFFEXOR-XR) 150 MG 24 hr capsule Take total of 225 mg daily, take along with 75 mg cap 90 capsule 0   venlafaxine XR (EFFEXOR-XR) 75 MG 24 hr capsule Take total of 225 mg daily, take along with 150 mg cap 90 capsule 0   cyanocobalamin 1000 MCG tablet Take 1 tablet by mouth daily. (Patient not taking: Reported on 08/19/2021)     ferrous sulfate 325 (65 FE) MG tablet Take 1 tablet (325 mg total) by mouth 2 (two) times daily with a meal. (Patient not taking: Reported on 08/19/2021) 30 tablet 3   No current facility-administered medications for this visit.    OBJECTIVE: Vitals:   09/05/21 1045  BP: 125/74  Pulse: 74  Resp: 18  Temp: 98.1 F (36.7 C)  SpO2: 94%     Body mass index is 31.51 kg/m.    ECOG FS:0 - Asymptomatic  General: Well-developed, well-nourished, no acute distress. Eyes: Pink conjunctiva, anicteric sclera. HEENT: Normocephalic, moist mucous membranes. Lungs: No audible wheezing or coughing. Heart: Regular rate and rhythm. Abdomen: Soft, nontender, no obvious distention. Musculoskeletal: No edema, cyanosis, or clubbing. Neuro: Alert, answering all questions appropriately. Cranial nerves grossly intact. Skin: No rashes or petechiae noted. Psych: Normal affect.  LAB RESULTS:  Lab Results  Component Value Date   NA 138 08/20/2021   K 4.9 08/20/2021   CL 95 (L)  08/20/2021   CO2 38 (H) 08/20/2021   GLUCOSE 152 (H) 08/20/2021   BUN 15 08/20/2021   CREATININE 0.66 08/20/2021   CALCIUM 8.9 08/20/2021   PROT 7.7 08/18/2021   ALBUMIN 3.2 (L) 08/18/2021   AST 31 08/18/2021   ALT 23 08/18/2021   ALKPHOS 96 08/18/2021   BILITOT 0.4 08/18/2021   GFRNONAA >60 08/20/2021   GFRAA >60 11/25/2019    Lab Results  Component Value Date   WBC 16.0 (H) 08/20/2021   NEUTROABS 9.0 (H) 08/18/2021   HGB 10.0 (L) 08/20/2021   HCT 33.6 (L) 08/20/2021   MCV 95.2 08/20/2021   PLT 413 (H) 08/20/2021     STUDIES: DG Chest 2 View  Result Date: 08/18/2021 CLINICAL DATA:  Productive cough and shortness of breath. EXAM: CHEST - 2 VIEW COMPARISON:  April 14, 2021 FINDINGS: Dominant stable mild to moderate severity diffuse chronic appearing increased interstitial lung markings are seen. Mild, stable areas of atelectasis and/or scarring are seen within the mid left lung and left lung base. There is no evidence of a pleural effusion or pneumothorax. The heart size and mediastinal contours are within normal limits. A radiopaque fusion plate and screws are seen overlying the lower cervical spine. Degenerative changes are noted throughout the thoracic spine. IMPRESSION: Chronic appearing increased interstitial lung markings with mild, stable mid left lung and left basilar scarring and/or atelectasis. Electronically Signed   By: Virgina Norfolk M.D.   On: 08/18/2021 17:14   CT Angio Chest PE W and/or Wo Contrast  Result Date: 08/19/2021 CLINICAL DATA:  Dyspnea, night sweats, malaise, hypoxia. PE suspected, high prob. 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 EXAM: CT ANGIOGRAPHY CHEST WITH CONTRAST TECHNIQUE: Multidetector CT imaging of the chest was performed using the standard protocol during  bolus administration of intravenous contrast. Multiplanar CT image reconstructions and MIPs were obtained to evaluate the vascular  anatomy. CONTRAST:  66m OMNIPAQUE IOHEXOL 350 MG/ML SOLN COMPARISON:  07/26/2021 chest CT. FINDINGS: Cardiovascular: The study is high quality for the evaluation of pulmonary embolism. There are no filling defects in the central, lobar, segmental or subsegmental pulmonary artery branches to suggest acute pulmonary embolism. Atherosclerotic nonaneurysmal thoracic aorta. Normal caliber pulmonary arteries. Normal heart size. Stable trace pericardial effusion/thickening. Three-vessel coronary atherosclerosis. Mediastinum/Nodes: No discrete thyroid nodules. Unremarkable esophagus. No axillary adenopathy. Newly enlarged 1.5 cm subcarinal lymph node (series 5/image 42). No hilar adenopathy. Lungs/Pleura: No pneumothorax. No pleural effusion. Mild centrilobular emphysema. Mild to moderate patchy tree-in-bud opacities at the left greater than right lung bases with scattered mucoid impaction, new/increased. Chronic sharply marginated consolidation, distortion and volume loss in the lingula is unchanged. No acute consolidative airspace disease. No new significant pulmonary nodules. Upper abdomen: Cholecystectomy. Stable subcentimeter hypodense right liver lesion, too small to characterize. Stable 1.1 cm left adrenal nodule with density -5 HU, compatible with benign adenoma. Musculoskeletal: No aggressive appearing focal osseous lesions. Moderate thoracic spondylosis. Partially visualized surgical hardware from ACDF. Review of the MIP images confirms the above findings. IMPRESSION: 1. No pulmonary embolism. 2. Mild to moderate patchy tree-in-bud opacities at the left greater than right lung bases with scattered mucoid impaction, new/increased, compatible with nonspecific mild infectious or inflammatory bronchiolitis, with the differential including aspiration. 3. Stable chronic sharply marginated consolidation, distortion and volume loss in the lingula, compatible with chronic post treatment change. 4. New mild subcarinal  lymphadenopathy, nonspecific. Attention on follow-up chest CT with IV contrast recommended in 3 months. 5. Three-vessel coronary atherosclerosis. 6. Stable left adrenal adenoma. 7. Aortic Atherosclerosis (ICD10-I70.0) and Emphysema (ICD10-J43.9). Electronically Signed   By: JIlona SorrelM.D.   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  PD-L1 85%.  PLAN:    1. Squamous cell carcinoma of the upper lobe of left lung: Patient completed concurrent chemotherapy with weekly carboplatinum and Taxol along with daily XRT on approximately September 24, 2019.  Her most recent CT scan on August 19, 2021 reviewed independently and reported as above with a new enlarged subcarinal lymph node of 1.5 cm.  This is likely reactive in the setting of her acute illness, but will repeat in 3 months to assess for any interval change.  Return to clinic 1 to 2 days after her imaging results for evaluation.  If she has improvement of her subcarinal lymph node, she then can be transitioned to yearly imaging and evaluation.     2.  Shortness of breath: Chronic and unchanged.  Continue oxygen as prescribed.  Follow-up with pulmonary as scheduled.  Patient expressed understanding and was in agreement with this plan. She also understands that She can call clinic at any time with any questions, concerns, or complaints.    Cancer Staging  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[facility] Staging form: Lung, AJCC 8th Edition - Clinical stage from 07/26/2019: Stage IIB (cT3, cN0, cM0) - Signed by FLloyd Huger MD on 07/26/2019   TLloyd Huger MD   09/05/2021 1:47 PM

## 2021-09-04 NOTE — Progress Notes (Signed)
Virtual Visit via Telephone Note  I connected with Suzanne Glenn on 09/06/21 at 11:00 AM EST by telephone and verified that I am speaking with the correct person using two identifiers.  Location: Patient: home Provider: office Persons participated in the visit- patient, provider    I discussed the limitations, risks, security and privacy concerns of performing an evaluation and management service by telephone and the availability of in person appointments. I also discussed with the patient that there may be a patient responsible charge related to this service. The patient expressed understanding and agreed to proceed.    I discussed the assessment and treatment plan with the patient. The patient was provided an opportunity to ask questions and all were answered. The patient agreed with the plan and demonstrated an understanding of the instructions.   The patient was advised to call back or seek an in-person evaluation if the symptoms worsen or if the condition fails to improve as anticipated.  I provided 14 minutes of non-face-to-face time during this encounter.   Norman Clay, MD    [facility] MD/PA/NP OP Progress Note  09/06/2021 11:30 AM Suzanne Glenn  MRN:  [id number]  Chief Complaint:  Chief Complaint   Follow-up; Depression    HPI:  - She was admitted for chronic hypoxic respiratory failure secondary to COPD exacerbation since the last visit.   This is a follow-up appointment for depression.  She states that she has been doing well.  She still immolate it her son's 74 year old birthday.  Although she always enjoys interaction with her son and her grandson, she stopped doing things which she used to be interested in, including word puzzles and TV.  She denies feeling depressed otherwise.  Although she had a hard time due to recent admission, she has been doing better lately.  She denies any anxiety or panic attacks associated with her shortness of breath.  She has middle  insomnia, which she partly attributes to restless leg.  Although she was taking iron pills while she was in the hospital, it has been difficult to continue due to constipation.  She agrees to discuss this with her PCP.  She has fair energy.  She had a 20 pounds weight loss over the 5 months.  She has occasionally decreased appetite.  She has good concentration.  She denies SI.  She feels comfortable to stay on the current medication regimen.   Daily routine: household chores, sees her son every day with 20 yo grandchildren  Exercise: Employment: used to work at [employer], [place] in [place]: husband  Household: husband Marital status: married for 28 years Number of children: 2. (Her daughter passed away in Dec 28, 2001 from diabetes at 69 year old) She grew up in [place]. She used to have a very close family. There is some discordance with one of her sisters   Visit Diagnosis:    ICD-10-CM   1. MDD (major depressive disorder), recurrent, in partial remission (HCC)  F33.41 Eszopiclone 3 MG TABS    venlafaxine XR (EFFEXOR-XR) 150 MG 24 hr capsule    2. GAD (generalized anxiety disorder)  F41.1 venlafaxine XR (EFFEXOR-XR) 150 MG 24 hr capsule    3. Insomnia, unspecified type  G47.00     4. Restless leg syndrome  G25.81       Past Psychiatric History: Please see initial evaluation for full details. I have reviewed the history. No updates at this time.     Past Medical History:  Past Medical History:  Diagnosis  Date   Anxiety    Asthma    Cancer ([place]) 12/2018   w/u for right upper lobe mass/cancer   COPD (chronic obstructive pulmonary disease) (HCC)    also, emphysema. now using o2 via np 24 hours a day   Coronary artery disease    Depression    Diabetes mellitus, type II (HCC)    GERD (gastroesophageal reflux disease)    Headache    migraines in early 20's   Heart murmur    Hypertension    Lung cancer ([place])    Neuropathy    Restless leg     Past Surgical History:  Procedure Laterality  Date   BACK SURGERY  2005   surgery x 2, disc fused in neck, pinched nerve in center of back and neck   CARDIAC CATHETERIZATION  2015   1 stent placed for blockage   cervical bone infusion     CHOLECYSTECTOMY     THORACIC DISC SURGERY     TUBAL LIGATION     VIDEO BRONCHOSCOPY WITH ENDOBRONCHIAL ULTRASOUND N/A 03/06/2019   Procedure: VIDEO BRONCHOSCOPY WITH ENDOBRONCHIAL ULTRASOUND - DIABETIC;  Surgeon: Ottie Glazier, MD;  Location: [facility] ORS;  Service: Thoracic;  Laterality: N/A;    Family Psychiatric History: Please see initial evaluation for full details. I have reviewed the history. No updates at this time.     Family History:  Family History  Problem Relation Age of Onset   Anxiety disorder Mother    Cancer - Lung Mother    Depression Sister    Cancer Sister    Cancer Sister    Cancer Sister    Cancer Sister    Heart Problems Sister    Bipolar disorder Sister    Diabetes Sister    Cancer - Lung Sister    Hypertension Sister    Diabetes Sister    Hyperlipidemia Sister    COPD Sister    Depression Brother    Heart Problems Brother    Cancer Brother    Cancer Brother    Cancer Brother    Breast cancer Maternal Aunt    Depression Son     Social History:  Social History   Socioeconomic History   Marital status: Married    Spouse name: Abe People   Number of children: Not on file   Years of education: Not on file   Highest education level: Not on file  Occupational History    Comment: disabled  Tobacco Use   Smoking status: Every Day    Packs/day: 1.00    Years: 40.00    Pack years: 40.00    Types: Cigarettes    Start date: 05/05/1975   Smokeless tobacco: Never  Vaping Use   Vaping Use: Never used  Substance and Sexual Activity   Alcohol use: No    Alcohol/week: 0.0 standard drinks    Comment: socially   Drug use: No   Sexual activity: Not Currently  Other Topics Concern   Not on file  Social History Narrative   Not on file   Social Determinants of  Health   Financial Resource Strain: Not on file  Food Insecurity: Not on file  Transportation Needs: Not on file  Physical Activity: Not on file  Stress: Not on file  Social Connections: Not on file    Allergies:  Allergies  Allergen Reactions   Diclofenac-Misoprostol Other (See Comments)    Arthrotec - gastritis    Nsaids Other (See Comments)    gastritis  Zolpidem Other (See Comments)    Sleep walking    Hydrocodone-Acetaminophen Nausea And Vomiting   Simvastatin Other (See Comments)    body aches    Metabolic Disorder Labs: Lab Results  Component Value Date   HGBA1C 6.8 (H) 08/20/2021   MPG 148.46 08/20/2021   MPG 136.98 01/02/2019   No results found for: PROLACTIN No results found for: CHOL, TRIG, HDL, CHOLHDL, VLDL, LDLCALC No results found for: TSH  Therapeutic Level Labs: No results found for: LITHIUM No results found for: VALPROATE No components found for:  CBMZ  Current Medications: Current Outpatient Medications  Medication Sig Dispense Refill   ferrous sulfate 325 (65 FE) MG tablet Take 1 tablet (325 mg total) by mouth 2 (two) times daily with a meal. 30 tablet 3   ACCU-CHEK FASTCLIX LANCETS MISC      ACCU-CHEK SMARTVIEW test strip      albuterol (VENTOLIN HFA) 108 (90 Base) MCG/ACT inhaler Inhale 2 puffs into the lungs every 4 (four) hours as needed for wheezing or shortness of breath.      amLODipine (NORVASC) 10 MG tablet Take 10 mg by mouth daily.      carbidopa-levodopa (SINEMET IR) 25-100 MG tablet Take 1 tablet by mouth 3 (three) times daily.     cyanocobalamin 1000 MCG tablet Take 1 tablet by mouth daily. (Patient not taking: Reported on 08/19/2021)     dextromethorphan-guaiFENesin (MUCINEX DM) 30-600 MG 12hr tablet Take 1 tablet by mouth 2 (two) times daily. 30 tablet 1   esomeprazole (NEXIUM) 40 MG capsule Take 40 mg by mouth daily.     [START ON 09/28/2021] Eszopiclone 3 MG TABS Take 1 tablet (3 mg total) by mouth at bedtime as needed. Take  immediately before bedtime 30 tablet 1   feeding supplement (ENSURE ENLIVE / ENSURE PLUS) LIQD Take 237 mLs by mouth 2 (two) times daily between meals. 237 mL 12   Fluticasone-Umeclidin-Vilant 100-62.5-25 MCG/INH AEPB Inhale 1 puff into the lungs daily. Treligy     gabapentin (NEURONTIN) 400 MG capsule Take 1 capsule (400 mg total) by mouth 3 (three) times daily. 270 capsule 0   ipratropium-albuterol (DUONEB) 0.5-2.5 (3) MG/3ML SOLN Take 3 mLs by nebulization every 6 (six) hours. 360 mL 0   metFORMIN (GLUCOPHAGE) 500 MG tablet Take 1 tablet (500 mg total) by mouth 2 (two) times daily with a meal. 60 tablet 1   metoprolol tartrate (LOPRESSOR) 25 MG tablet Take 25 mg by mouth 2 (two) times daily.      Multiple Vitamin (MULTIVITAMIN) tablet Take 1 tablet by mouth daily.     nicotine (NICODERM CQ - DOSED IN MG/24 HOURS) 21 mg/24hr patch Place 1 patch (21 mg total) onto the skin daily. 28 patch 0   ondansetron (ZOFRAN) 4 MG tablet Take 1 tablet (4 mg total) by mouth every 6 (six) hours as needed for nausea. 20 tablet 0   OXYGEN Inhale 2 L into the lungs continuous.     pravastatin (PRAVACHOL) 80 MG tablet Take 80 mg by mouth every evening.      predniSONE (DELTASONE) 10 MG tablet Take 4 tablets (40 mg total) by mouth daily with breakfast. For 3 days, then take 3 tablets for next 3 days, 2 tablets for the following 3 days, 1 tablet for 3 days and then stop it 40 tablet 0   spironolactone (ALDACTONE) 25 MG tablet Take 25 mg by mouth daily.      venlafaxine XR (EFFEXOR-XR) 150 MG 24 hr capsule  Take 1 capsule (150 mg total) by mouth daily. Take total of 225 mg daily, take along with 75 mg cap 90 capsule 0   venlafaxine XR (EFFEXOR-XR) 75 MG 24 hr capsule Take 1 capsule (75 mg total) by mouth daily. Take total of 225 mg daily, take along with 150 mg cap 90 capsule 0   No current facility-administered medications for this visit.     Musculoskeletal: Strength & Muscle Tone:  N/A Gait & Station:   N/A Patient leans: N/A  Psychiatric Specialty Exam: Review of Systems  Psychiatric/Behavioral:  Positive for sleep disturbance. Negative for agitation, behavioral problems, confusion, decreased concentration, dysphoric mood, hallucinations, self-injury and suicidal ideas. The patient is nervous/anxious. The patient is not hyperactive.   All other systems reviewed and are negative.  There were no vitals taken for this visit.There is no height or weight on file to calculate BMI.  General Appearance: NA  Eye Contact:  NA  Speech:  Clear and Coherent  Volume:  Normal  Mood:   good  Affect:  NA  Thought Process:  Coherent  Orientation:  Full (Time, Place, and Person)  Thought Content: Logical   Suicidal Thoughts:  No  Homicidal Thoughts:  No  Memory:  Immediate;   Good  Judgement:  Good  Insight:  Good  Psychomotor Activity:  Normal  Concentration:  Concentration: Good and Attention Span: Good  Recall:  Good  Fund of Knowledge: Good  Language: Good  Akathisia:  No  Handed:  Right  AIMS (if indicated): not done  Assets:  Communication Skills Desire for Improvement  ADL's:  Intact  Cognition: WNL  Sleep:  Poor   Screenings: PHQ2-9    Flowsheet Row Video Visit from 05/31/2021 in [place] Video Visit from 12/22/2020 in [facility] Patient Outreach Telephone from 02/28/2017 in [place]  PHQ-2 Total Score 0 0 1      [place] to Hosp-Admission (Discharged) from 08/19/2021 in [place] Video Visit from 05/31/2021 in [place] [facility] to Hosp-Admission (Discharged) from 04/14/2021 in [place] [facility]  C-SSRS RISK CATEGORY No Risk No Risk No Risk        Assessment and Plan:  Suzanne Glenn is a 70 y.o. 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   1. MDD (major depressive disorder), recurrent, in partial remission ([place]) 2. GAD (generalized anxiety disorder) Although she reports occasional anhedonia, she was interaction with her family, and her mood has been overall stable since the last visit. Psychosocial stressors includes medical condition of COPD on oxygen mask, loss of her daughter from diabetes several years ago, and conflict with one of her sisters.  She has good relationship with her husband and her son, who visits her every day.  Will continue current dose of venlafaxine to target depression and anxiety.    # Restless leg syndrome She continues to have restless leg since the last visit, although she has been on iron pills, she experiences constipation.  She is advised to discuss this with her PCP.  Will continue gabapentin to target both restless leg and an anxiety.   3. Insomnia, unspecified type She reports good  benefit from eszopiclone.  Will continue current dose as needed for insomnia.  Discussed potential risk of fall and drowsiness especially in geriatric population. Noted that she has snoring as well; she is advised again to discuss with her pulmonologist whether she will be a good candidate for sleep study.   # weight loss She has had significant weight loss for the past 6 months.  According to the patient, her PCP is aware of this.  She is advised to continue having follow-up with her PCP.    Plan Continue venlafaxine 225 mg daily Continue gabapentin 400 mg three times a day (facial numbness from 600 mg TID) Continue eszopiclone 3 mg at night as needed for sleep Next appointment- 2/6 at 9:30 for 30 mins, in person - on metoprolol       The patient demonstrates the following risk factors for  suicide: Chronic risk factors for suicide include: psychiatric disorder of depression . Acute risk factors for suicide include: family or marital conflict, unemployment, and loss (financial, interpersonal, professional). Protective factors for this patient include: positive social support. Considering these factors, the overall suicide risk at this point appears to be low. Patient is appropriate for outpatient follow up.           Norman Clay, MD 09/06/2021, 11:30 AM

## 2021-09-05 ENCOUNTER — Inpatient Hospital Stay: Payer: Medicare HMO | Attending: Oncology | Admitting: Oncology

## 2021-09-05 ENCOUNTER — Other Ambulatory Visit: Payer: Self-pay

## 2021-09-05 VITALS — BP 125/74 | HR 74 | Temp 98.1°F | Resp 18 | Wt 183.6 lb

## 2021-09-05 DIAGNOSIS — Z9221 Personal history of antineoplastic chemotherapy: Secondary | ICD-10-CM | POA: Diagnosis not present

## 2021-09-05 DIAGNOSIS — C3412 Malignant neoplasm of upper lobe, left bronchus or lung: Secondary | ICD-10-CM | POA: Diagnosis not present

## 2021-09-05 DIAGNOSIS — C3492 Malignant neoplasm of unspecified part of left bronchus or lung: Secondary | ICD-10-CM

## 2021-09-05 DIAGNOSIS — Z923 Personal history of irradiation: Secondary | ICD-10-CM | POA: Diagnosis not present

## 2021-09-05 DIAGNOSIS — F17210 Nicotine dependence, cigarettes, uncomplicated: Secondary | ICD-10-CM | POA: Diagnosis not present

## 2021-09-05 DIAGNOSIS — Z9981 Dependence on supplemental oxygen: Secondary | ICD-10-CM | POA: Diagnosis not present

## 2021-09-05 DIAGNOSIS — R0602 Shortness of breath: Secondary | ICD-10-CM | POA: Insufficient documentation

## 2021-09-05 DIAGNOSIS — R599 Enlarged lymph nodes, unspecified: Secondary | ICD-10-CM | POA: Insufficient documentation

## 2021-09-05 NOTE — Progress Notes (Signed)
Pt c/o increased SOb since yesterday. Has been taking otc medications and has used her nebulizer. Denies known fever but states she woke up in sweat over night. Sats 94-95% on 2L [place]

## 2021-09-06 ENCOUNTER — Telehealth (INDEPENDENT_AMBULATORY_CARE_PROVIDER_SITE_OTHER): Payer: Medicare HMO | Admitting: Psychiatry

## 2021-09-06 ENCOUNTER — Encounter: Payer: Self-pay | Admitting: Psychiatry

## 2021-09-06 DIAGNOSIS — G2581 Restless legs syndrome: Secondary | ICD-10-CM

## 2021-09-06 DIAGNOSIS — F411 Generalized anxiety disorder: Secondary | ICD-10-CM

## 2021-09-06 DIAGNOSIS — F3341 Major depressive disorder, recurrent, in partial remission: Secondary | ICD-10-CM

## 2021-09-06 DIAGNOSIS — G4700 Insomnia, unspecified: Secondary | ICD-10-CM | POA: Diagnosis not present

## 2021-09-06 MED ORDER — ESZOPICLONE 3 MG PO TABS: 3.0000 mg | ORAL_TABLET | Freq: Every evening | ORAL | 1 refills | Status: DC | PRN

## 2021-09-06 MED ORDER — VENLAFAXINE HCL ER 150 MG PO CP24: 150.0000 mg | ORAL_CAPSULE | Freq: Every day | ORAL | 0 refills | Status: DC

## 2021-09-06 MED ORDER — VENLAFAXINE HCL ER 75 MG PO CP24: 75.0000 mg | ORAL_CAPSULE | Freq: Every day | ORAL | 0 refills | Status: DC

## 2021-09-06 NOTE — Patient Instructions (Signed)
Continue venlafaxine 225 mg daily Continue gabapentin 400 mg three times a day  Continue eszopiclone 3 mg at night as needed for sleep Next appointment- 2/6 at 9:30  The next visit will be in person visit. Please arrive 15 mins before the scheduled time.   [facility]  Address: [address], [place], [place] [address]

## 2021-10-13 DIAGNOSIS — Z9981 Dependence on supplemental oxygen: Secondary | ICD-10-CM | POA: Diagnosis not present

## 2021-10-13 DIAGNOSIS — R1030 Lower abdominal pain, unspecified: Secondary | ICD-10-CM | POA: Diagnosis not present

## 2021-10-13 DIAGNOSIS — R0602 Shortness of breath: Secondary | ICD-10-CM | POA: Diagnosis not present

## 2021-10-13 DIAGNOSIS — J441 Chronic obstructive pulmonary disease with (acute) exacerbation: Secondary | ICD-10-CM | POA: Diagnosis not present

## 2021-10-23 ENCOUNTER — Ambulatory Visit
Admission: RE | Admit: 2021-10-23 | Discharge: 2021-10-23 | Disposition: A | Discharge status: Home / Self Care | Payer: Medicare HMO | Source: Ambulatory Visit | Attending: Internal Medicine | Admitting: Internal Medicine

## 2021-10-23 ENCOUNTER — Other Ambulatory Visit: Payer: Self-pay

## 2021-10-23 DIAGNOSIS — Z1231 Encounter for screening mammogram for malignant neoplasm of breast: Secondary | ICD-10-CM | POA: Diagnosis not present

## 2021-11-13 DIAGNOSIS — C3492 Malignant neoplasm of unspecified part of left bronchus or lung: Secondary | ICD-10-CM | POA: Diagnosis not present

## 2021-11-13 DIAGNOSIS — J449 Chronic obstructive pulmonary disease, unspecified: Secondary | ICD-10-CM | POA: Diagnosis not present

## 2021-11-13 DIAGNOSIS — E119 Type 2 diabetes mellitus without complications: Secondary | ICD-10-CM | POA: Diagnosis not present

## 2021-11-13 DIAGNOSIS — Z1331 Encounter for screening for depression: Secondary | ICD-10-CM | POA: Diagnosis not present

## 2021-11-13 DIAGNOSIS — E782 Mixed hyperlipidemia: Secondary | ICD-10-CM | POA: Diagnosis not present

## 2021-11-13 DIAGNOSIS — Z0000 Encounter for general adult medical examination without abnormal findings: Secondary | ICD-10-CM | POA: Diagnosis not present

## 2021-11-13 DIAGNOSIS — R195 Other fecal abnormalities: Secondary | ICD-10-CM | POA: Diagnosis not present

## 2021-11-13 DIAGNOSIS — E1165 Type 2 diabetes mellitus with hyperglycemia: Secondary | ICD-10-CM | POA: Diagnosis not present

## 2021-11-13 DIAGNOSIS — J9610 Chronic respiratory failure, unspecified whether with hypoxia or hypercapnia: Secondary | ICD-10-CM | POA: Diagnosis not present

## 2021-11-13 DIAGNOSIS — R42 Dizziness and giddiness: Secondary | ICD-10-CM | POA: Diagnosis not present

## 2021-11-13 DIAGNOSIS — I10 Essential (primary) hypertension: Secondary | ICD-10-CM | POA: Diagnosis not present

## 2021-11-13 DIAGNOSIS — Z720 Tobacco use: Secondary | ICD-10-CM | POA: Diagnosis not present

## 2021-11-13 DIAGNOSIS — E7800 Pure hypercholesterolemia, unspecified: Secondary | ICD-10-CM | POA: Diagnosis not present

## 2021-11-13 NOTE — Progress Notes (Signed)
BH MD/PA/NP OP Progress Note  11/20/2021 10:00 AM Suzanne Glenn  MRN:  [id number]  Chief Complaint:  Chief Complaint   Follow-up    HPI:  This is a follow-up appointment for depression, anxiety and insomnia.  She states that she has had dizziness for the past several weeks; which made her to go to the ED. it can happen anytime, including the time when she is sitting.  She agrees to contact her cardiologist given her bradycardia on today's vitals.  She occasionally feels down as she is unable to see her grandson as much compared to before due to her physical condition.  However, she enjoys interaction with him as well as seeing her son.  Although she misses her daughter, she thinks she has been doing good.  When she was asked about her anhedonia, she states that she would enjoy things if she were to have better physical condition.  She is hoping to do more exercise when the weather gets warmer.  She goes to deck regularly, and she enjoys it and enjoys the time with her husband.  She sleeps better.  She has less restless leg.  She eats once a day; she has been working on this with her PCP.  She denies SI.  She feels anxious at times especially due to her physical condition of shortness of breath.  She has occasional panic attacks.  She wishes to continue the same dose of gabapentin and an mesopic as it has been working well for her while she acknowledges its potential risk of drowsiness.  She likes in person visit as it makes her go outside.   Wt Readings from Last 3 Encounters:  11/20/21 180 lb 6.4 oz (81.8 kg)  11/17/21 185 lb (83.9 kg)  09/05/21 183 lb 9.6 oz (83.3 kg)      Daily routine: household chores, sees her son every day with 62 yo grandchildren  Exercise: Employment: used to work at [employer], [place] in [place]: husband  Household: husband Marital status: married for 24 years Number of children: 2. (Her daughter passed away in 01-21-2002 from diabetes at 70 year old) She grew up in  [place]. She used to have a very close family. There is some discordance with one of her sisters   Visit Diagnosis:    ICD-10-CM   1. MDD (major depressive disorder), recurrent episode, mild (HCC)  F33.0 Eszopiclone 3 MG TABS    venlafaxine XR (EFFEXOR-XR) 150 MG 24 hr capsule    2. GAD (generalized anxiety disorder)  F41.1 venlafaxine XR (EFFEXOR-XR) 150 MG 24 hr capsule    3. Insomnia, unspecified type  G47.00       Past Psychiatric History: Please see initial evaluation for full details. I have reviewed the history. No updates at this time.     Past Medical History:  Past Medical History:  Diagnosis Date   Anxiety    Asthma    Cancer ([place]) 2019-01-22   w/u for right upper lobe mass/cancer   COPD (chronic obstructive pulmonary disease) (HCC)    also, emphysema. now using o2 via np 24 hours a day   Coronary artery disease    Depression    Diabetes mellitus, type II (HCC)    GERD (gastroesophageal reflux disease)    Headache    migraines in early 01/22/23   Heart murmur    Hypertension    Lung cancer ([place])    Neuropathy    Restless leg     Past Surgical History:  Procedure Laterality Date   BACK SURGERY  2005   surgery x 2, disc fused in neck, pinched nerve in center of back and neck   CARDIAC CATHETERIZATION  2015   1 stent placed for blockage   cervical bone infusion     CHOLECYSTECTOMY     THORACIC DISC SURGERY     TUBAL LIGATION     VIDEO BRONCHOSCOPY WITH ENDOBRONCHIAL ULTRASOUND N/A 03/06/2019   Procedure: VIDEO BRONCHOSCOPY WITH ENDOBRONCHIAL ULTRASOUND - DIABETIC;  Surgeon: Ottie Glazier, MD;  Location: [facility] ORS;  Service: Thoracic;  Laterality: N/A;    Family Psychiatric History: Please see initial evaluation for full details. I have reviewed the history. No updates at this time.     Family History:  Family History  Problem Relation Age of Onset   Anxiety disorder Mother    Cancer - Lung Mother    Depression Sister    Cancer Sister    Cancer Sister     Cancer Sister    Cancer Sister    Heart Problems Sister    Bipolar disorder Sister    Diabetes Sister    Cancer - Lung Sister    Hypertension Sister    Diabetes Sister    Hyperlipidemia Sister    COPD Sister    Depression Brother    Heart Problems Brother    Cancer Brother    Cancer Brother    Cancer Brother    Breast cancer Maternal Aunt    Depression Son     Social History:  Social History   Socioeconomic History   Marital status: Married    Spouse name: Abe People   Number of children: Not on file   Years of education: Not on file   Highest education level: Not on file  Occupational History    Comment: disabled  Tobacco Use   Smoking status: Every Day    Packs/day: 1.00    Years: 40.00    Pack years: 40.00    Types: Cigarettes    Start date: 05/05/1975   Smokeless tobacco: Never  Vaping Use   Vaping Use: Never used  Substance and Sexual Activity   Alcohol use: No    Alcohol/week: 0.0 standard drinks    Comment: socially   Drug use: No   Sexual activity: Not Currently  Other Topics Concern   Not on file  Social History Narrative   Not on file   Social Determinants of Health   Financial Resource Strain: Not on file  Food Insecurity: Not on file  Transportation Needs: Not on file  Physical Activity: Not on file  Stress: Not on file  Social Connections: Not on file    Allergies:  Allergies  Allergen Reactions   Diclofenac-Misoprostol Other (See Comments)    Arthrotec - gastritis    Nsaids Other (See Comments)    gastritis   Zolpidem Other (See Comments)    Sleep walking    Hydrocodone-Acetaminophen Nausea And Vomiting   Simvastatin Other (See Comments)    body aches    Metabolic Disorder Labs: Lab Results  Component Value Date   HGBA1C 6.8 (H) 08/20/2021   MPG 148.46 08/20/2021   MPG 136.98 01/02/2019   No results found for: PROLACTIN No results found for: CHOL, TRIG, HDL, CHOLHDL, VLDL, LDLCALC No results found for: TSH  Therapeutic  Level Labs: No results found for: LITHIUM No results found for: VALPROATE No components found for:  CBMZ  Current Medications: Current Outpatient Medications  Medication Sig Dispense Refill  ACCU-CHEK FASTCLIX LANCETS MISC      ACCU-CHEK SMARTVIEW test strip      albuterol (VENTOLIN HFA) 108 (90 Base) MCG/ACT inhaler Inhale 2 puffs into the lungs every 4 (four) hours as needed for wheezing or shortness of breath.      amLODipine (NORVASC) 10 MG tablet Take 10 mg by mouth daily.      carbidopa-levodopa (SINEMET IR) 25-100 MG tablet Take 1 tablet by mouth 3 (three) times daily.     cyanocobalamin 1000 MCG tablet Take 1 tablet by mouth daily.     dextromethorphan-guaiFENesin (MUCINEX DM) 30-600 MG 12hr tablet Take 1 tablet by mouth 2 (two) times daily. 30 tablet 1   esomeprazole (NEXIUM) 40 MG capsule Take 40 mg by mouth daily.     feeding supplement (ENSURE ENLIVE / ENSURE PLUS) LIQD Take 237 mLs by mouth 2 (two) times daily between meals. 237 mL 12   ferrous sulfate 325 (65 FE) MG tablet Take 1 tablet (325 mg total) by mouth 2 (two) times daily with a meal. 30 tablet 3   Fluticasone-Umeclidin-Vilant 100-62.5-25 MCG/INH AEPB Inhale 1 puff into the lungs daily. Treligy     ipratropium-albuterol (DUONEB) 0.5-2.5 (3) MG/3ML SOLN Take 3 mLs by nebulization every 6 (six) hours. 360 mL 0   meclizine (ANTIVERT) 25 MG tablet Take by mouth.     metFORMIN (GLUCOPHAGE) 500 MG tablet Take 1 tablet (500 mg total) by mouth 2 (two) times daily with a meal. 60 tablet 1   metoprolol tartrate (LOPRESSOR) 25 MG tablet Take 25 mg by mouth 2 (two) times daily.      Multiple Vitamin (MULTIVITAMIN) tablet Take 1 tablet by mouth daily.     nicotine (NICODERM CQ - DOSED IN MG/24 HOURS) 21 mg/24hr patch Place 1 patch (21 mg total) onto the skin daily. 28 patch 0   ondansetron (ZOFRAN) 4 MG tablet Take 1 tablet (4 mg total) by mouth every 6 (six) hours as needed for nausea. 20 tablet 0   OXYGEN Inhale 2 L into the  lungs continuous.     pravastatin (PRAVACHOL) 80 MG tablet Take 80 mg by mouth every evening.      predniSONE (DELTASONE) 10 MG tablet Take 4 tablets (40 mg total) by mouth daily with breakfast. For 3 days, then take 3 tablets for next 3 days, 2 tablets for the following 3 days, 1 tablet for 3 days and then stop it 40 tablet 0   spironolactone (ALDACTONE) 25 MG tablet Take 25 mg by mouth daily.      [START ON 11/27/2021] Eszopiclone 3 MG TABS Take 1 tablet (3 mg total) by mouth at bedtime as needed. Take immediately before bedtime 30 tablet 1   [START ON 12/01/2021] gabapentin (NEURONTIN) 400 MG capsule Take 1 capsule (400 mg total) by mouth 3 (three) times daily. 270 capsule 0   [START ON 12/06/2021] venlafaxine XR (EFFEXOR-XR) 150 MG 24 hr capsule Take 1 capsule (150 mg total) by mouth daily. Take total of 225 mg daily, take along with 75 mg cap 90 capsule 0   [START ON 12/06/2021] venlafaxine XR (EFFEXOR-XR) 75 MG 24 hr capsule Take 1 capsule (75 mg total) by mouth daily. Take total of 225 mg daily, take along with 150 mg cap 90 capsule 1   No current facility-administered medications for this visit.     Musculoskeletal: Strength & Muscle Tone: within normal limits Gait & Station: normal Patient leans: N/A  Psychiatric Specialty Exam: Review of Systems  Psychiatric/Behavioral:  Positive for decreased concentration and dysphoric mood. Negative for agitation, behavioral problems, confusion, hallucinations, self-injury, sleep disturbance and suicidal ideas. The patient is nervous/anxious. The patient is not hyperactive.   All other systems reviewed and are negative.  Blood pressure 137/75, pulse (!) 47, temperature 98.5 F (36.9 C), temperature source Temporal, weight 180 lb 6.4 oz (81.8 kg), SpO2 96 %.Body mass index is 31.96 kg/m.  General Appearance: Fairly Groomed  Eye Contact:  Good  Speech:  Clear and Coherent  Volume:  Normal  Mood:   good  Affect:  Appropriate and Congruent   Thought Process:  Coherent  Orientation:  Full (Time, Place, and Person)  Thought Content: Logical   Suicidal Thoughts:  No  Homicidal Thoughts:  No  Memory:  Immediate;   Good  Judgement:  Good  Insight:  Good  Psychomotor Activity:  Normal  Concentration:  Concentration: Good and Attention Span: Good  Recall:  Good  Fund of Knowledge: Good  Language: Good  Akathisia:  No  Handed:  Right  AIMS (if indicated): not done  Assets:  Communication Skills Desire for Improvement  ADL's:  Intact  Cognition: WNL  Sleep:  Good   Screenings: PHQ2-9    [place] Office Visit from 11/20/2021 in [facility] Video Visit from 05/31/2021 in [facility] Video Visit from 12/22/2020 in [facility] Patient Outreach Telephone from 02/28/2017 in [facility]  PHQ-2 Total Score 4 0 0 1  PHQ-9 Total Score 10 -- -- --      [place] Office Visit from 11/20/2021 in [place] ED from 11/17/2021 in [place] [facility] ED to Hosp-Admission (Discharged) from 08/19/2021 in [facility] No Risk No Risk No Risk        Assessment and Plan:  Suzanne Glenn is a 71 y.o. 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   1. MDD (major depressive disorder), recurrent episode, mild ([place]) 2. GAD (generalized anxiety disorder) She reports overall improvement in her mood since the last visit.  Psychosocial stressors includes medical condition of COPD on oxygen mask, loss of her daughter from diabetes  several years ago, and conflict with one of her sisters.  Noted that her depressive symptoms are attributable to demoralization due to her medical condition, and she enjoys interaction with her family, including her husband and her son.  Will continue current dose of venlafaxine to target depression and anxiety.  Will continue gabapentin for anxiety and restless leg.   3. Insomnia, unspecified type She reports good benefit from eszopiclone.  Will continue current dose to target insomnia.  Noted that she does have snoring; she was advised to discuss with her pulmonologist to see if she will benefit from sleep study.   # Dizziness She reports dizziness unrelated to position/orthostatics.  She has bradycardia on today's evaluation, and she is on metoprolol.  She was advised to contact her cardiologist for further evaluation.  She was also provided psychoeducation about her medication possibly contributing to dizziness, which includes gabapentin and an eszopiclone.   # weight loss She has had significant weight loss for the past 6 months.  According to the patient, her PCP is aware of this.  She is advised to continue having follow-up with her PCP.   This clinician has discussed the side effect associated with medication prescribed during this encounter. Please refer to notes in the previous encounters for more details.     Plan Continue venlafaxine 225 mg daily Continue gabapentin 400 mg three times a day (facial numbness from 600 mg TID) Continue eszopiclone 3 mg at night as needed for sleep Next appointment- 4/10 at 10 AM for 30 mins, in person - on metoprolol, sinemet for restless leg        The patient demonstrates the following risk factors for suicide: Chronic risk factors for suicide include: psychiatric disorder of depression . Acute risk factors for suicide include: family or marital conflict, unemployment, and loss (financial, interpersonal, professional). Protective factors for this  patient include: positive social support. Considering these factors, the overall suicide risk at this point appears to be low. Patient is appropriate for outpatient follow up.       Norman Clay, MD 11/20/2021, 10:00 AM

## 2021-11-15 ENCOUNTER — Other Ambulatory Visit: Payer: Self-pay

## 2021-11-15 ENCOUNTER — Ambulatory Visit
Admission: RE | Admit: 2021-11-15 | Discharge: 2021-11-15 | Disposition: A | Discharge status: Home / Self Care | Payer: Medicare HMO | Source: Ambulatory Visit | Attending: Oncology | Admitting: Oncology

## 2021-11-15 DIAGNOSIS — R918 Other nonspecific abnormal finding of lung field: Secondary | ICD-10-CM | POA: Diagnosis not present

## 2021-11-15 DIAGNOSIS — C3490 Malignant neoplasm of unspecified part of unspecified bronchus or lung: Secondary | ICD-10-CM | POA: Diagnosis not present

## 2021-11-15 DIAGNOSIS — C3492 Malignant neoplasm of unspecified part of left bronchus or lung: Secondary | ICD-10-CM | POA: Insufficient documentation

## 2021-11-15 DIAGNOSIS — R911 Solitary pulmonary nodule: Secondary | ICD-10-CM | POA: Diagnosis not present

## 2021-11-15 DIAGNOSIS — I2510 Atherosclerotic heart disease of native coronary artery without angina pectoris: Secondary | ICD-10-CM | POA: Diagnosis not present

## 2021-11-15 DIAGNOSIS — I700 Atherosclerosis of aorta: Secondary | ICD-10-CM | POA: Diagnosis not present

## 2021-11-15 DIAGNOSIS — J45909 Unspecified asthma, uncomplicated: Secondary | ICD-10-CM | POA: Diagnosis not present

## 2021-11-15 DIAGNOSIS — J449 Chronic obstructive pulmonary disease, unspecified: Secondary | ICD-10-CM | POA: Diagnosis not present

## 2021-11-15 DIAGNOSIS — E119 Type 2 diabetes mellitus without complications: Secondary | ICD-10-CM | POA: Diagnosis not present

## 2021-11-15 DIAGNOSIS — I10 Essential (primary) hypertension: Secondary | ICD-10-CM | POA: Diagnosis not present

## 2021-11-15 DIAGNOSIS — Z85118 Personal history of other malignant neoplasm of bronchus and lung: Secondary | ICD-10-CM | POA: Diagnosis not present

## 2021-11-15 DIAGNOSIS — Z85110 Personal history of malignant carcinoid tumor of bronchus and lung: Secondary | ICD-10-CM | POA: Diagnosis not present

## 2021-11-15 DIAGNOSIS — R42 Dizziness and giddiness: Secondary | ICD-10-CM | POA: Diagnosis not present

## 2021-11-15 DIAGNOSIS — R079 Chest pain, unspecified: Secondary | ICD-10-CM | POA: Diagnosis not present

## 2021-11-15 MED ORDER — IOHEXOL 300 MG/ML  SOLN
75.0000 mL | Freq: Once | INTRAMUSCULAR | Status: AC | PRN
  Administered 2021-11-15: 75 mL via INTRAVENOUS

## 2021-11-17 ENCOUNTER — Other Ambulatory Visit: Payer: Self-pay

## 2021-11-17 ENCOUNTER — Emergency Department
Admission: EM | Admit: 2021-11-17 | Discharge: 2021-11-17 | Disposition: A | Discharge status: Home / Self Care | Payer: Medicare HMO | Attending: Emergency Medicine | Admitting: Emergency Medicine

## 2021-11-17 ENCOUNTER — Emergency Department: Payer: Medicare HMO

## 2021-11-17 DIAGNOSIS — I2510 Atherosclerotic heart disease of native coronary artery without angina pectoris: Secondary | ICD-10-CM | POA: Insufficient documentation

## 2021-11-17 DIAGNOSIS — R079 Chest pain, unspecified: Secondary | ICD-10-CM | POA: Diagnosis not present

## 2021-11-17 DIAGNOSIS — I10 Essential (primary) hypertension: Secondary | ICD-10-CM | POA: Insufficient documentation

## 2021-11-17 DIAGNOSIS — R42 Dizziness and giddiness: Secondary | ICD-10-CM | POA: Insufficient documentation

## 2021-11-17 DIAGNOSIS — J449 Chronic obstructive pulmonary disease, unspecified: Secondary | ICD-10-CM | POA: Insufficient documentation

## 2021-11-17 DIAGNOSIS — J45909 Unspecified asthma, uncomplicated: Secondary | ICD-10-CM | POA: Insufficient documentation

## 2021-11-17 DIAGNOSIS — E119 Type 2 diabetes mellitus without complications: Secondary | ICD-10-CM | POA: Insufficient documentation

## 2021-11-17 DIAGNOSIS — Z85118 Personal history of other malignant neoplasm of bronchus and lung: Secondary | ICD-10-CM | POA: Insufficient documentation

## 2021-11-17 DIAGNOSIS — C3490 Malignant neoplasm of unspecified part of unspecified bronchus or lung: Secondary | ICD-10-CM | POA: Diagnosis not present

## 2021-11-17 LAB — CBC
HCT: 44.7 % (ref 36.0–46.0)
Hemoglobin: 14.1 g/dL (ref 12.0–15.0)
MCH: 30 pg (ref 26.0–34.0)
MCHC: 31.5 g/dL (ref 30.0–36.0)
MCV: 95.1 fL (ref 80.0–100.0)
Platelets: 293 10*3/uL (ref 150–400)
RBC: 4.7 MIL/uL (ref 3.87–5.11)
RDW: 14.3 % (ref 11.5–15.5)
WBC: 9.1 10*3/uL (ref 4.0–10.5)
nRBC: 0 % (ref 0.0–0.2)

## 2021-11-17 LAB — URINALYSIS, ROUTINE W REFLEX MICROSCOPIC
Glucose, UA: NEGATIVE mg/dL
Hgb urine dipstick: NEGATIVE
Ketones, ur: 15 mg/dL — AB
Leukocytes,Ua: NEGATIVE
Nitrite: NEGATIVE
Protein, ur: 30 mg/dL — AB
Specific Gravity, Urine: >1.03 — ABNORMAL HIGH (ref 1.005–1.030)
pH: 5 (ref 5.0–8.0)

## 2021-11-17 LAB — BASIC METABOLIC PANEL
Anion gap: 10 (ref 5–15)
BUN: 19 mg/dL (ref 8–23)
CO2: 32 mmol/L (ref 22–32)
Calcium: 9.5 mg/dL (ref 8.9–10.3)
Chloride: 94 mmol/L — ABNORMAL LOW (ref 98–111)
Creatinine, Ser: 0.74 mg/dL (ref 0.44–1.00)
GFR, Estimated: >60 mL/min (ref >60)
Glucose, Bld: 127 mg/dL — ABNORMAL HIGH (ref 70–99)
Potassium: 4.4 mmol/L (ref 3.5–5.1)
Sodium: 136 mmol/L (ref 135–145)

## 2021-11-17 LAB — URINALYSIS, MICROSCOPIC (REFLEX)

## 2021-11-17 LAB — TROPONIN I (HIGH SENSITIVITY)
Troponin I (High Sensitivity): 11 ng/L (ref <18)
Troponin I (High Sensitivity): 12 ng/L (ref <18)

## 2021-11-17 NOTE — Discharge Instructions (Signed)
Your CAT scan and blood work were reassuring today.  I suspect that your dizzy feeling may be due to an inner ear problem for which you can continue to try the meclizine.  Please be sure to turn the light on when you are getting up at nighttime and walk slowly and have something to hold onto so that you do not fall.  If you continue to have symptoms, please follow-up with your primary doctor as you may need to see a neurologist.

## 2021-11-17 NOTE — ED Notes (Signed)
Pt verbalized understanding of discharge instructions and follow-up care instructions. Pt advised if symptoms worsen to return to ED.  

## 2021-11-17 NOTE — ED Notes (Signed)
Pt is coming in with c/o of dizziness that has been happening since Thursday. Pt is able to move all extremities. Pt is able to ambulate with a steady gait. Initial NIH score is 0. Pt denies pain at this time.

## 2021-11-17 NOTE — ED Triage Notes (Signed)
Pt here with dizziness that started last Thurs. Pt also c/o cp today. Pt states that dizinesss was gotten much worse since last night. Pt has nausea but denies V/D. Pt wears 2L BNC chronically.

## 2021-11-17 NOTE — ED Provider Notes (Signed)
[facility] Provider Note    Event Date/Time   First MD Initiated Contact with Patient 11/17/21 1515     (approximate)   History   Dizziness   HPI  Suzanne Glenn is a 71 y.o. female  with pmh asthma, COPD, type diabetes mellitus, hypertension and lung cancer who presents with lightheadedness and dizziness.  Symptoms have been going on intermittently for the last 8 days.  She endorses both a sensation of dizziness and lightheadedness that is worse at night.  Coming and going.  It is not associated with double vision numbness, tingling weakness dysarthria or dysphagia.  She saw her primary doctor for this and was prescribed meclizine.  Last night symptoms were particularly bad she had to get up to use the bathroom multiple times and felt like she was falling to the right and unsteady and off balance.  Currently sitting in the stretcher she does not have symptoms.  She feels a little bit short of breath and had some chest tightness as well.  This is a chronic issue for her.  Denies fevers chills.  No abdominal pain nausea or vomiting.  Does have mild dysuria.     Past Medical History:  Diagnosis Date   Anxiety    Asthma    Cancer ([place]) 12/2018   w/u for right upper lobe mass/cancer   COPD (chronic obstructive pulmonary disease) (HCC)    also, emphysema. now using o2 via np 24 hours a day   Coronary artery disease    Depression    Diabetes mellitus, type II (HCC)    GERD (gastroesophageal reflux disease)    Headache    migraines in early 20's   Heart murmur    Hypertension    Lung cancer ([place])    Neuropathy    Restless leg     Patient Active Problem List   Diagnosis Date Noted   Acute on chronic respiratory failure ([place]) 08/19/2021   COPD exacerbation ([place]) 04/14/2021   HLD (hyperlipidemia) 04/14/2021   Diabetes mellitus without complication ([place]) 93/57/0177   Depression 04/14/2021   CAD (coronary artery disease) 04/14/2021   Iron deficiency  anemia 04/14/2021   Sepsis ([place]) 04/14/2021   RLS (restless legs syndrome) 04/14/2021   Acute on chronic respiratory failure with hypoxia ([place]) 03/20/2021   Atypical pneumonia 03/19/2021   MDD (major depressive disorder), recurrent, in full remission ([place]) 12/11/2019   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[place]) 07/26/2019   Goals of care, counseling/discussion 07/26/2019   Major depressive disorder, recurrent, in partial remission ([place]) 03/27/2019   Chronic respiratory failure with hypoxia ([place]) 03/27/2019   Urge incontinence of urine 03/27/2019   Tobacco use 01/13/2019   Mass of upper lobe of left lung 01/13/2019   Symptomatic anemia 01/01/2019   Restless leg syndrome 06/30/2018   Pure hypercholesterolemia 09/26/2017   Sepsis due to pneumonia ([place]) 02/24/2017   Bilateral carotid artery stenosis 03/30/2016   Syncope and collapse 03/30/2016   Essential (primary) hypertension 08/29/2015   Controlled type 2 diabetes mellitus without complication ([place]) 93/90/3009   Combined fat and carbohydrate induced hyperlipemia 06/30/2015   Clinical depression 05/05/2015   GAD (generalized anxiety disorder) 05/05/2015   Depression, major, recurrent, moderate ([place]) 05/05/2015   Benign essential HTN 04/05/2015   Diabetes mellitus, type 2 ([place]) 02/09/2015   Type 2 diabetes mellitus ([place]) 02/09/2015   Cannot sleep 01/24/2015   Fibromyalgia 01/24/2015   CAFL (chronic airflow limitation) ([place]) 01/24/2015   H/O diabetes mellitus 01/24/2015  H/O hypercholesterolemia 01/24/2015   H/O: HTN (hypertension) 01/24/2015   Coronary artery disease 01/24/2015   Hypokalemia 01/24/2015   Arteriosclerosis of coronary artery 10/04/2014   Chest pain 10/04/2014   3-vessel CAD 08/02/2014   Acute chest pain 06/25/2014   SOB (shortness of breath) 06/25/2014   COPD, moderate ([place]) 06/08/2014   Moderate COPD (chronic obstructive pulmonary disease) ([place]) 06/08/2014   Gastro-esophageal reflux disease without esophagitis  05/31/2014     Physical Exam  Triage Vital Signs: ED Triage Vitals  Enc Vitals Group     BP 11/17/21 1322 122/72     Pulse Rate 11/17/21 1322 90     Resp 11/17/21 1322 20     Temp 11/17/21 1322 98.2 F (36.8 C)     Temp Source 11/17/21 1322 Oral     SpO2 11/17/21 1322 94 %     Weight 11/17/21 1343 185 lb (83.9 kg)     Height 11/17/21 1343 5\' 3"  (1.6 m)     Head Circumference --      Peak Flow --      Pain Score 11/17/21 1343 2     Pain Loc --      Pain Edu? --      Excl. in [place]? --     Most recent vital signs: Vitals:   11/17/21 1528 11/17/21 1600  BP: 131/80 121/70  Pulse: 72 70  Resp: 20 (!) 22  Temp:    SpO2: 99% 97%     General: Awake, no distress.  CV:  Good peripheral perfusion.  Resp:  Normal effort.  Abd:  No distention.  Soft and nontender throughout Neuro:             Awake, Alert, Oriented x 3  Other:  Aox3, nml speech  PERRL, EOMI, face symmetric, nml tongue movement  5/5 strength in the BL upper and lower extremities  Sensation grossly intact in the BL upper and lower extremities  Finger-nose-finger intact BL Patient has stable gait, no ataxia   ED Results / Procedures / Treatments  Labs (all labs ordered are listed, but only abnormal results are displayed) Labs Reviewed  BASIC METABOLIC PANEL - Abnormal; Notable for the following components:      Result Value   Chloride 94 (*)    Glucose, Bld 127 (*)    All other components within normal limits  URINALYSIS, ROUTINE W REFLEX MICROSCOPIC - Abnormal; Notable for the following components:   Specific Gravity, Urine >1.030 (*)    Bilirubin Urine SMALL (*)    Ketones, ur 15 (*)    Protein, ur 30 (*)    All other components within normal limits  URINALYSIS, MICROSCOPIC (REFLEX) - Abnormal; Notable for the following components:   Bacteria, UA RARE (*)    All other components within normal limits  CBC  CBG MONITORING, ED  TROPONIN I (HIGH SENSITIVITY)  TROPONIN I (HIGH SENSITIVITY)      EKG  Sinus rhythm with multiple PVCs, J-point elevation in lead aVF, and V4,, similar in appearance to prior EKG   RADIOLOGY I reviewed the CXR which does not show any acute cardiopulmonary process; agree with radiology report   I reviewed the CT scan of the brain which does not show any acute intracranial process; agree with radiology report     PROCEDURES:  Critical Care performed: No  .1-3 Lead EKG Interpretation Performed by: Rada Hay, MD Authorized by: Rada Hay, MD     Interpretation: normal  ECG rate assessment: normal     Rhythm: sinus rhythm     Ectopy: PVCs    The patient is on the cardiac monitor to evaluate for evidence of arrhythmia and/or significant heart rate changes.   MEDICATIONS ORDERED IN ED: Medications - No data to display   IMPRESSION / MDM / [place] / ED COURSE  I reviewed the triage vital signs and the nursing notes.                              Differential diagnosis includes, but is not limited to, orthostatic hypotension, arrhythmia, vestibular neuritis, peripheral neuropathy, labyrinthitis, central vertigo  Patient is a 71 year old female presenting with intermittent lightheadedness for the last 8 days.  The symptoms are intermittent clearly.  Seem to be worse when she is getting up walking around primarily at night.  Describes it both as feeling off balance and lightheaded like she is going to pass out.  Saw her primary doctor for this and was prescribed meclizine which has not really helped.  Symptoms were worse last night and she had to urinate multiple times during the night as well and felt the dizzy/lightheaded sensation each time.  Currently on exam she is asymptomatic.  She has no neurologic symptoms associated with these events,.  Dysphagia numbness tingling weakness or headache.  Her neurologic exam is intact she has no abnormal eye movements normal finger-nose-finger and has no ataxia.  I reviewed  her CT head which does not show any acute findings.  Patient also complained of some mild chest pain and shortness of breath which is a chronic thing for her, her EKG does have frequent PVCs and is abnormal with some J-point elevation in the inferior leads but this is similar to prior.  Her troponins x2 were negative.  She is satting well on her 2 L nasal cannula which is chronic for her.  Ultimately my suspicion for a posterior CVA is low given her normal exam and the fact that the symptoms are intermittent.  Suspect either vestibular neuritis or BPPV versus a proprioceptive issue that is made worse when her visual input is decreased at nighttime.  Given her normal neurologic exam now in fact that she is asymptomatic do not feel that she needs further MRI or other work-up here.  I did also recommend that she follow-up with her cardiologist as this could be intermittent arrhythmia as well althoughmy suspicion for this is lower.  Clinical Course as of 11/17/21 1659  Fri Nov 17, 2021  1648 Troponin I (High Sensitivity): 12 [KM]    Clinical Course User Index [KM] Rada Hay, MD     FINAL CLINICAL IMPRESSION(S) / ED DIAGNOSES   Final diagnoses:  Dizziness     Rx / DC Orders   ED Discharge Orders     None        Note:  This document was prepared using Dragon voice recognition software and may include unintentional dictation errors.   Rada Hay, MD 11/17/21 [phone]

## 2021-11-20 ENCOUNTER — Encounter: Payer: Self-pay | Admitting: Psychiatry

## 2021-11-20 ENCOUNTER — Other Ambulatory Visit: Payer: Self-pay

## 2021-11-20 ENCOUNTER — Ambulatory Visit (INDEPENDENT_AMBULATORY_CARE_PROVIDER_SITE_OTHER): Payer: Medicare HMO | Admitting: Psychiatry

## 2021-11-20 VITALS — BP 137/75 | HR 47 | Temp 98.5°F | Wt 180.4 lb

## 2021-11-20 DIAGNOSIS — F411 Generalized anxiety disorder: Secondary | ICD-10-CM

## 2021-11-20 DIAGNOSIS — G4700 Insomnia, unspecified: Secondary | ICD-10-CM | POA: Diagnosis not present

## 2021-11-20 DIAGNOSIS — F330 Major depressive disorder, recurrent, mild: Secondary | ICD-10-CM

## 2021-11-20 LAB — POCT I-STAT CREATININE: Creatinine, Ser: 0.8 mg/dL (ref 0.44–1.00)

## 2021-11-20 MED ORDER — GABAPENTIN 400 MG PO CAPS: 400.0000 mg | ORAL_CAPSULE | Freq: Three times a day (TID) | ORAL | 0 refills | Status: DC

## 2021-11-20 MED ORDER — ESZOPICLONE 3 MG PO TABS: 3.0000 mg | ORAL_TABLET | Freq: Every evening | ORAL | 1 refills | Status: DC | PRN

## 2021-11-20 MED ORDER — VENLAFAXINE HCL ER 150 MG PO CP24: 150.0000 mg | ORAL_CAPSULE | Freq: Every day | ORAL | 0 refills | Status: DC

## 2021-11-20 MED ORDER — VENLAFAXINE HCL ER 75 MG PO CP24: 75.0000 mg | ORAL_CAPSULE | Freq: Every day | ORAL | 1 refills | Status: DC

## 2021-11-20 NOTE — Progress Notes (Signed)
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  INTERVAL HISTORY: Patient returns to clinic today for further evaluation and discussion of her imaging results.  She complains of dizziness secondary to vertigo, but otherwise feels well.  She has no other neurologic complaints.  She continues to have chronic shortness of breath and requires oxygen 24 hours a day. She has no other neurologic complaints.  She denies any recent fevers or illnesses.  She has a good appetite and denies weight loss.  She denies any pain.  She has no chest pain, cough, or hemoptysis.  She denies any nausea, vomiting, constipation, or diarrhea.  She has no urinary complaints.  Patient offers no further specific complaints today.  REVIEW OF SYSTEMS:   Review of Systems  Constitutional: Negative.  Negative for fever, malaise/fatigue and weight loss.  Respiratory:  Positive for shortness of breath. Negative for cough and hemoptysis.   Cardiovascular: Negative.  Negative for chest pain and leg swelling.  Gastrointestinal: Negative.  Negative for abdominal pain.  Genitourinary: Negative.  Negative for dysuria.  Musculoskeletal: Negative.  Negative for back pain.  Skin: Negative.  Negative for rash.  Neurological:  Positive for dizziness. Negative for weakness and headaches.  Psychiatric/Behavioral: Negative.  The patient is not nervous/anxious.    As per HPI. Otherwise, a complete review of systems is negative.  PAST MEDICAL HISTORY: Past Medical History:  Diagnosis Date   Anxiety    Asthma    Cancer ([place]) 12/2018   w/u  for right upper lobe mass/cancer   COPD (chronic obstructive pulmonary disease) (HCC)    also, emphysema. now using o2 via np 24 hours a day   Coronary artery disease    Depression    Diabetes mellitus, type II (HCC)    GERD (gastroesophageal reflux disease)    Headache    migraines in early 20's   Heart murmur    Hypertension    Lung cancer ([place])    Neuropathy    Restless leg     PAST SURGICAL HISTORY: Past Surgical History:  Procedure Laterality Date   BACK SURGERY  2005   surgery x 2, disc fused in neck, pinched nerve in center of back and neck   CARDIAC CATHETERIZATION  2015   1 stent placed for blockage   cervical bone infusion     CHOLECYSTECTOMY     THORACIC DISC SURGERY     TUBAL LIGATION     VIDEO BRONCHOSCOPY WITH ENDOBRONCHIAL ULTRASOUND N/A 03/06/2019   Procedure: VIDEO BRONCHOSCOPY WITH ENDOBRONCHIAL ULTRASOUND - DIABETIC;  Surgeon: Ottie Glazier, MD;  Location: [facility] ORS;  Service: Thoracic;  Laterality: N/A;    FAMILY HISTORY: Family History  Problem Relation Age of Onset   Anxiety disorder Mother    Cancer - Lung Mother    Depression Sister    Cancer Sister    Cancer Sister    Cancer Sister    Cancer Sister    Heart Problems Sister    Bipolar disorder Sister    Diabetes Sister    Cancer - Lung Sister    Hypertension Sister    Diabetes Sister    Hyperlipidemia Sister  COPD Sister    Depression Brother    Heart Problems Brother    Cancer Brother    Cancer Brother    Cancer Brother    Breast cancer Maternal Aunt    Depression Son     ADVANCED DIRECTIVES (Y/N):  N  HEALTH MAINTENANCE: Social History   Tobacco Use   Smoking status: Every Day    Packs/day: 1.00    Years: 40.00    Pack years: 40.00    Types: Cigarettes    Start date: 05/05/1975   Smokeless tobacco: Never  Vaping Use   Vaping Use: Never used  Substance Use Topics   Alcohol use: No    Alcohol/week: 0.0 standard drinks    Comment: socially   Drug use: No      Colonoscopy:  PAP:  Bone density:  Lipid panel:  Allergies  Allergen Reactions   Diclofenac-Misoprostol Other (See Comments)    Arthrotec - gastritis    Nsaids Other (See Comments)    gastritis   Zolpidem Other (See Comments)    Sleep walking    Hydrocodone-Acetaminophen Nausea And Vomiting   Simvastatin Other (See Comments)    body aches    Current Outpatient Medications  Medication Sig Dispense Refill   ACCU-CHEK FASTCLIX LANCETS MISC      ACCU-CHEK SMARTVIEW test strip      albuterol (VENTOLIN HFA) 108 (90 Base) MCG/ACT inhaler Inhale 2 puffs into the lungs every 4 (four) hours as needed for wheezing or shortness of breath.      amLODipine (NORVASC) 10 MG tablet Take 10 mg by mouth daily.      carbidopa-levodopa (SINEMET IR) 25-100 MG tablet Take 1 tablet by mouth 3 (three) times daily.     cyanocobalamin 1000 MCG tablet Take 1 tablet by mouth daily.     dextromethorphan-guaiFENesin (MUCINEX DM) 30-600 MG 12hr tablet Take 1 tablet by mouth 2 (two) times daily. 30 tablet 1   esomeprazole (NEXIUM) 40 MG capsule Take 40 mg by mouth daily.     [START ON 11/27/2021] Eszopiclone 3 MG TABS Take 1 tablet (3 mg total) by mouth at bedtime as needed. Take immediately before bedtime 30 tablet 1   feeding supplement (ENSURE ENLIVE / ENSURE PLUS) LIQD Take 237 mLs by mouth 2 (two) times daily between meals. 237 mL 12   ferrous sulfate 325 (65 FE) MG tablet Take 1 tablet (325 mg total) by mouth 2 (two) times daily with a meal. 30 tablet 3   Fluticasone-Umeclidin-Vilant 100-62.5-25 MCG/INH AEPB Inhale 1 puff into the lungs daily. Treligy     [START ON 12/01/2021] gabapentin (NEURONTIN) 400 MG capsule Take 1 capsule (400 mg total) by mouth 3 (three) times daily. 270 capsule 0   ipratropium-albuterol (DUONEB) 0.5-2.5 (3) MG/3ML SOLN Take 3 mLs by nebulization every 6 (six) hours. 360 mL 0   meclizine (ANTIVERT) 25 MG tablet Take by mouth.     metFORMIN (GLUCOPHAGE) 500 MG tablet Take 1  tablet (500 mg total) by mouth 2 (two) times daily with a meal. 60 tablet 1   metoprolol tartrate (LOPRESSOR) 25 MG tablet Take 25 mg by mouth 2 (two) times daily.      Multiple Vitamin (MULTIVITAMIN) tablet Take 1 tablet by mouth daily.     nicotine (NICODERM CQ - DOSED IN MG/24 HOURS) 21 mg/24hr patch Place 1 patch (21 mg total) onto the skin daily. 28 patch 0   ondansetron (ZOFRAN) 4 MG tablet Take 1 tablet (4 mg total) by mouth  every 6 (six) hours as needed for nausea. 20 tablet 0   OXYGEN Inhale 2 L into the lungs continuous.     pravastatin (PRAVACHOL) 80 MG tablet Take 80 mg by mouth every evening.      predniSONE (DELTASONE) 10 MG tablet Take 4 tablets (40 mg total) by mouth daily with breakfast. For 3 days, then take 3 tablets for next 3 days, 2 tablets for the following 3 days, 1 tablet for 3 days and then stop it 40 tablet 0   spironolactone (ALDACTONE) 25 MG tablet Take 25 mg by mouth daily.      [START ON 12/06/2021] venlafaxine XR (EFFEXOR-XR) 150 MG 24 hr capsule Take 1 capsule (150 mg total) by mouth daily. Take total of 225 mg daily, take along with 75 mg cap 90 capsule 0   [START ON 12/06/2021] venlafaxine XR (EFFEXOR-XR) 75 MG 24 hr capsule Take 1 capsule (75 mg total) by mouth daily. Take total of 225 mg daily, take along with 150 mg cap 90 capsule 1   No current facility-administered medications for this visit.    OBJECTIVE: Vitals:   11/21/21 1006  BP: 132/81  Pulse: 90  Temp: 97.8 F (36.6 C)     Body mass index is 30.43 kg/m.    ECOG FS:0 - Asymptomatic  General: Well-developed, well-nourished, no acute distress. Eyes: Pink conjunctiva, anicteric sclera. HEENT: Normocephalic, moist mucous membranes. Lungs: No audible wheezing or coughing. Heart: Regular rate and rhythm. Abdomen: Soft, nontender, no obvious distention. Musculoskeletal: No edema, cyanosis, or clubbing. Neuro: Alert, answering all questions appropriately. Cranial nerves grossly intact. Skin: No  rashes or petechiae noted. Psych: Normal affect.  LAB RESULTS:  Lab Results  Component Value Date   NA 136 11/17/2021   K 4.4 11/17/2021   CL 94 (L) 11/17/2021   CO2 32 11/17/2021   GLUCOSE 127 (H) 11/17/2021   BUN 19 11/17/2021   CREATININE 0.74 11/17/2021   CALCIUM 9.5 11/17/2021   PROT 7.7 08/18/2021   ALBUMIN 3.2 (L) 08/18/2021   AST 31 08/18/2021   ALT 23 08/18/2021   ALKPHOS 96 08/18/2021   BILITOT 0.4 08/18/2021   GFRNONAA >60 11/17/2021   GFRAA >60 11/25/2019    Lab Results  Component Value Date   WBC 9.1 11/17/2021   NEUTROABS 9.0 (H) 08/18/2021   HGB 14.1 11/17/2021   HCT 44.7 11/17/2021   MCV 95.1 11/17/2021   PLT 293 11/17/2021     STUDIES: DG Chest 2 View  Result Date: 11/17/2021 CLINICAL DATA:  Dizziness for 1 week.  Chest pain today. EXAM: CHEST - 2 VIEW COMPARISON:  CT 11/15/2021.  Radiographs 08/18/2021. FINDINGS: The heart size and mediastinal contours are stable. There is stable opacity in the lingula, corresponding with scarring on CT and attributed to treated lung cancer on recent CT. No new airspace disease, pleural effusion or pneumothorax identified. There are mild degenerative changes in the thoracic spine status post lower cervical fusion. IMPRESSION: Stable radiographic appearance of the chest with lingular scarring attributed to treated lung cancer. No new findings. Electronically Signed   By: Richardean Sale M.D.   On: 11/17/2021 14:05   CT HEAD WO CONTRAST  Result Date: 11/17/2021 CLINICAL DATA:  Dizziness since last Thursday EXAM: CT HEAD WITHOUT CONTRAST TECHNIQUE: Contiguous axial images were obtained from the base of the skull through the vertex without intravenous contrast. RADIATION DOSE REDUCTION: This exam was performed according to the departmental dose-optimization program which includes automated exposure control, adjustment of the  mA and/or kV according to patient size and/or use of iterative reconstruction technique. COMPARISON:   03/17/2006 FINDINGS: Brain: No evidence of acute infarction, hemorrhage, extra-axial collection, ventriculomegaly, or mass effect. Generalized cerebral atrophy. Periventricular white matter low attenuation likely secondary to microangiopathy. Vascular: Cerebrovascular atherosclerotic calcifications are noted. Skull: Negative for fracture or focal lesion. Sinuses/Orbits: Visualized portions of the orbits are unremarkable. Visualized portions of the paranasal sinuses are unremarkable. Visualized portions of the mastoid air cells are unremarkable. Other: None. IMPRESSION: 1. No acute intracranial pathology. 2. Chronic microvascular disease and cerebral atrophy. Electronically Signed   By: Kathreen Devoid M.D.   On: 11/17/2021 14:30   CT Chest W Contrast  Result Date: 11/16/2021 CLINICAL DATA:  71 year old female with history of lung cancer. Follow-up evaluation of enlarged lymph node. EXAM: CT CHEST WITH CONTRAST TECHNIQUE: Multidetector CT imaging of the chest was performed during intravenous contrast administration. RADIATION DOSE REDUCTION: This exam was performed according to the departmental dose-optimization program which includes automated exposure control, adjustment of the mA and/or kV according to patient size and/or use of iterative reconstruction technique. CONTRAST:  31m OMNIPAQUE IOHEXOL 300 MG/ML  SOLN COMPARISON:  Multiple priors, most recently chest CT 08/19/2021. FINDINGS: Cardiovascular: Heart size is normal. Small amount of pericardial fluid and/or thickening adjacent to the anterior wall of the left ventricle, increased compared to the prior study, but unlikely to be of hemodynamic significance at this time. No pericardial calcification. There is aortic atherosclerosis, as well as atherosclerosis of the great vessels of the mediastinum and the coronary arteries, including calcified atherosclerotic plaque in the left main, left anterior descending, left circumflex and right coronary arteries. Mild  calcification of the aortic valve. Mediastinum/Nodes: No pathologically enlarged mediastinal or hilar lymph nodes. Specifically, previously noted subcarinal lymph nodes have decreased in size, currently measuring 8 mm in short axis. Esophagus is unremarkable in appearance. No axillary lymphadenopathy. Lungs/Pleura: Chronic mass-like architectural distortion and volume loss in the left upper lobe, similar to prior examination, most compatible with areas of chronic postradiation mass-like fibrosis. A few scattered tiny pulmonary nodules are noted, generally stable in size and number to prior examinations, the exception being a ill-defined subsolid nodule in the left upper lobe near the apex (axial image 23 of series 3) which measures 7 x 6 mm, slightly larger than the prior examination where this measured only 5 mm. No acute consolidative airspace disease. No pleural effusions. Upper Abdomen: Aortic atherosclerosis. Status post cholecystectomy. 1.3 x 1.0 cm low-attenuation (8 HU) left adrenal nodule, stable compared to prior examinations, compatible with a benign adenoma. Musculoskeletal: There are no aggressive appearing lytic or blastic lesions noted in the visualized portions of the skeleton. Orthopedic fixation hardware in the lower cervical spine incidentally noted. IMPRESSION: 1. Regression of previously noted subcarinal lymphadenopathy. 2. Tiny pulmonary nodules are generally stable compared to the prior study, with the exception of a sub solid nodule in the left upper lobe which is slightly increased compared to the prior study, but measures only 7 x 6 mm. This is nonspecific. Close attention on follow-up studies is recommended to ensure stability or regression. 3. Stable postradiation mass-like fibrosis in the lingula of the left upper lobe, similar to prior studies. 4. Small amount of pericardial fluid and/or thickening, increased slightly compared to the prior study, but unlikely to be of hemodynamic  significance at this time. No pericardial calcification. 5. Aortic atherosclerosis, in addition to left main and three-vessel coronary artery disease. Please note that although the presence of coronary  artery calcium documents the presence of coronary artery disease, the severity of this disease and any potential stenosis cannot be assessed on this non-gated CT examination. Assessment for potential risk factor modification, dietary therapy or pharmacologic therapy may be warranted, if clinically indicated. 6. There are calcifications of the aortic valve. Echocardiographic correlation for evaluation of potential valvular dysfunction may be warranted if clinically indicated. 7. Small left adrenal adenoma, unchanged. Aortic Atherosclerosis (ICD10-I70.0). Electronically Signed   By: Vinnie Langton M.D.   On: 11/16/2021 08:17   MM 3D SCREEN BREAST BILATERAL  Result Date: 10/23/2021 CLINICAL DATA:  Screening. EXAM: DIGITAL SCREENING BILATERAL MAMMOGRAM WITH TOMOSYNTHESIS AND CAD TECHNIQUE: Bilateral screening digital craniocaudal and mediolateral oblique mammograms were obtained. Bilateral screening digital breast tomosynthesis was performed. The images were evaluated with computer-aided detection. COMPARISON:  Previous exam(s). ACR Breast Density Category b: There are scattered areas of fibroglandular density. FINDINGS: There are no findings suspicious for malignancy. IMPRESSION: No mammographic evidence of malignancy. A result letter of this screening mammogram will be mailed directly to the patient. RECOMMENDATION: Screening mammogram in one year. (Code:SM-B-01Y) BI-RADS CATEGORY  1: Negative. Electronically Signed   By: Franki Cabot M.D.   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  PD-L1 85%.  PLAN:    1. Squamous cell carcinoma of the upper lobe of left lung: Patient completed concurrent chemotherapy with weekly carboplatinum and Taxol along  with daily XRT on approximately September 24, 2019.  Her most recent CT scan on August 19, 2021 reviewed independently with a new enlarged subcarinal lymph node of 1.5 cm.  Repeat CT scan on November 17, 2021 reviewed independently and reported revealed resolution of subcarinal lymph node and no other evidence of recurrent or metastatic disease.  No intervention is needed at this time.  Return to clinic in 6 months with repeat imaging and further evaluation.   2.  Shortness of breath: Chronic and unchanged.  Continue oxygen as prescribed.  Follow-up with pulmonary as scheduled. 3.  Dizziness: Patient reports likely vertigo and is currently on meclizine.  Follow-up with primary care as indicated.  Patient expressed understanding and was in agreement with this plan. She also understands that She can call clinic at any time with any questions, concerns, or complaints.    Cancer Staging  Squamous cell carcinoma of left lung [facility]) Staging form: Lung, AJCC 8th Edition - Clinical stage from 07/26/2019: Stage IIB (cT3, cN0, cM0) - Signed by Lloyd Huger, MD on 07/26/2019   Lloyd Huger, MD   11/21/2021 12:30 PM

## 2021-11-21 ENCOUNTER — Other Ambulatory Visit: Payer: Self-pay

## 2021-11-21 ENCOUNTER — Inpatient Hospital Stay: Payer: Medicare HMO | Attending: Oncology | Admitting: Oncology

## 2021-11-21 ENCOUNTER — Encounter: Payer: Self-pay | Admitting: Oncology

## 2021-11-21 VITALS — BP 132/81 | HR 90 | Temp 97.8°F | Wt 171.8 lb

## 2021-11-21 DIAGNOSIS — C3412 Malignant neoplasm of upper lobe, left bronchus or lung: Secondary | ICD-10-CM | POA: Diagnosis not present

## 2021-11-21 DIAGNOSIS — R0602 Shortness of breath: Secondary | ICD-10-CM | POA: Insufficient documentation

## 2021-11-21 DIAGNOSIS — Z9981 Dependence on supplemental oxygen: Secondary | ICD-10-CM | POA: Diagnosis not present

## 2021-11-21 DIAGNOSIS — R42 Dizziness and giddiness: Secondary | ICD-10-CM | POA: Insufficient documentation

## 2021-11-21 DIAGNOSIS — F17210 Nicotine dependence, cigarettes, uncomplicated: Secondary | ICD-10-CM | POA: Insufficient documentation

## 2021-11-21 DIAGNOSIS — C3492 Malignant neoplasm of unspecified part of left bronchus or lung: Secondary | ICD-10-CM

## 2021-11-21 DIAGNOSIS — Z79899 Other long term (current) drug therapy: Secondary | ICD-10-CM | POA: Insufficient documentation

## 2021-11-21 NOTE — Progress Notes (Signed)
Pt stated that 2 wks ago she is having dizzy spells and small chest pains. She went to the ED and said BP is low.

## 2021-11-28 DIAGNOSIS — I25118 Atherosclerotic heart disease of native coronary artery with other forms of angina pectoris: Secondary | ICD-10-CM | POA: Diagnosis not present

## 2021-11-28 DIAGNOSIS — E782 Mixed hyperlipidemia: Secondary | ICD-10-CM | POA: Diagnosis not present

## 2021-11-28 DIAGNOSIS — I10 Essential (primary) hypertension: Secondary | ICD-10-CM | POA: Diagnosis not present

## 2021-11-28 DIAGNOSIS — Z720 Tobacco use: Secondary | ICD-10-CM | POA: Diagnosis not present

## 2021-11-28 DIAGNOSIS — I6523 Occlusion and stenosis of bilateral carotid arteries: Secondary | ICD-10-CM | POA: Diagnosis not present

## 2021-12-11 DIAGNOSIS — I25118 Atherosclerotic heart disease of native coronary artery with other forms of angina pectoris: Secondary | ICD-10-CM | POA: Diagnosis not present

## 2021-12-12 DIAGNOSIS — I25118 Atherosclerotic heart disease of native coronary artery with other forms of angina pectoris: Secondary | ICD-10-CM | POA: Diagnosis not present

## 2021-12-20 DIAGNOSIS — I6523 Occlusion and stenosis of bilateral carotid arteries: Secondary | ICD-10-CM | POA: Diagnosis not present

## 2021-12-20 DIAGNOSIS — I25118 Atherosclerotic heart disease of native coronary artery with other forms of angina pectoris: Secondary | ICD-10-CM | POA: Diagnosis not present

## 2022-01-09 DIAGNOSIS — R195 Other fecal abnormalities: Secondary | ICD-10-CM | POA: Diagnosis not present

## 2022-01-09 DIAGNOSIS — K5909 Other constipation: Secondary | ICD-10-CM | POA: Diagnosis not present

## 2022-01-09 DIAGNOSIS — Z9981 Dependence on supplemental oxygen: Secondary | ICD-10-CM | POA: Diagnosis not present

## 2022-01-09 DIAGNOSIS — J449 Chronic obstructive pulmonary disease, unspecified: Secondary | ICD-10-CM | POA: Diagnosis not present

## 2022-01-15 ENCOUNTER — Other Ambulatory Visit: Payer: Self-pay | Admitting: Psychiatry

## 2022-01-15 DIAGNOSIS — R0609 Other forms of dyspnea: Secondary | ICD-10-CM | POA: Diagnosis not present

## 2022-01-15 DIAGNOSIS — J449 Chronic obstructive pulmonary disease, unspecified: Secondary | ICD-10-CM | POA: Diagnosis not present

## 2022-01-15 DIAGNOSIS — F330 Major depressive disorder, recurrent, mild: Secondary | ICD-10-CM

## 2022-01-15 DIAGNOSIS — Z01818 Encounter for other preprocedural examination: Secondary | ICD-10-CM | POA: Diagnosis not present

## 2022-01-15 DIAGNOSIS — F17210 Nicotine dependence, cigarettes, uncomplicated: Secondary | ICD-10-CM | POA: Diagnosis not present

## 2022-01-17 ENCOUNTER — Other Ambulatory Visit: Payer: Self-pay | Admitting: Psychiatry

## 2022-01-17 DIAGNOSIS — F330 Major depressive disorder, recurrent, mild: Secondary | ICD-10-CM

## 2022-01-19 NOTE — Progress Notes (Signed)
BH MD/PA/NP OP Progress Note ? ?01/22/2022 10:32 AM ?Suzanne Glenn  ?MRN:  [id number] ? ?Chief Complaint:  ?Chief Complaint  ?Patient presents with  ? Follow-up  ? Depression  ? ?HPI:  ?Is a follow-up appointment for depression and insomnia.  ? ?She states that she has been feeling depressed lately.  She does not have desire to do things.  Although she used to enjoy playing bingo, she does not think she would enjoy it anymore.  She also does not go outside due to pollen allergy.  She reports good relationship with her husband.  He is very flexible when she does not want to cook.  She sees her son and her grandson, and he enjoys interaction with them.  She wonders if she might be stressed due to upcoming endoscopy and cardiac evaluation.  She has depressive symptoms as in PHQ-9.  She denies SI.  She notices that she does not feel anxious as much compared to before.  She continues to have restless leg.  She denies change in appetite or weight.  She agrees to try mirtazapine at this time.  She denies any dizziness since she was started on some medication for a few weeks.  ? ? ?Daily routine: household chores, sees her son every day with 70 yo grandchildren  ?Exercise: ?Employment: used to work at [employer] in [place] ?Support: husband  ?Household: husband ?Marital status: married for 53 years ?Number of children: 2. (Her daughter passed away in 12-31-01 from diabetes at 70 year old) ?She grew up in [place]. She used to have a very close family. There is some discordance with one of her sisters  ? ? ?Wt Readings from Last 3 Encounters:  ?01/22/22 183 lb 6.4 oz (83.2 kg)  ?11/21/21 171 lb 12.8 oz (77.9 kg)  ?11/20/21 180 lb 6.4 oz (81.8 kg)  ?  ?Visit Diagnosis:  ?  ICD-10-CM   ?1. Insomnia, unspecified type  G47.00   ?  ?2. MDD (major depressive disorder), recurrent episode, moderate (HCC)  F33.1 venlafaxine XR (EFFEXOR-XR) 150 MG 24 hr capsule  ?  Eszopiclone 3 MG TABS  ?  ?3. GAD (generalized anxiety disorder)  F41.1  venlafaxine XR (EFFEXOR-XR) 150 MG 24 hr capsule  ?  ?4. Restless leg syndrome  G25.81   ?  ? ? ?Past Psychiatric History: Please see initial evaluation for full details. I have reviewed the history. No updates at this time.  ?  ? ?Past Medical History:  ?Past Medical History:  ?Diagnosis Date  ? Anxiety   ? Asthma   ? Cancer [facility]) 01-01-19  ? w/u for right upper lobe mass/cancer  ? COPD (chronic obstructive pulmonary disease) ([place])   ? also, emphysema. now using o2 via np 24 hours a day  ? Coronary artery disease   ? Depression   ? Diabetes mellitus, type II ([place])   ? GERD (gastroesophageal reflux disease)   ? Headache   ? migraines in early 01/01/2023  ? Heart murmur   ? Hypertension   ? Lung cancer ([place])   ? Neuropathy   ? Restless leg   ?  ?Past Surgical History:  ?Procedure Laterality Date  ? BACK SURGERY  2004-01-01  ? surgery x 2, disc fused in neck, pinched nerve in center of back and neck  ? CARDIAC CATHETERIZATION  2013/12/31  ? 1 stent placed for blockage  ? cervical bone infusion    ? CHOLECYSTECTOMY    ? THORACIC DISC SURGERY    ?  TUBAL LIGATION    ? VIDEO BRONCHOSCOPY WITH ENDOBRONCHIAL ULTRASOUND N/A 03/06/2019  ? Procedure: VIDEO BRONCHOSCOPY WITH ENDOBRONCHIAL ULTRASOUND - DIABETIC;  Surgeon: Ottie Glazier, MD;  Location: [facility] ORS;  Service: Thoracic;  Laterality: N/A;  ? ? ?Family Psychiatric History: Please see initial evaluation for full details. I have reviewed the history. No updates at this time.  ?  ? ?Family History:  ?Family History  ?Problem Relation Age of Onset  ? Anxiety disorder Mother   ? Cancer - Lung Mother   ? Depression Sister   ? Cancer Sister   ? Cancer Sister   ? Cancer Sister   ? Cancer Sister   ? Heart Problems Sister   ? Bipolar disorder Sister   ? Diabetes Sister   ? Cancer - Lung Sister   ? Hypertension Sister   ? Diabetes Sister   ? Hyperlipidemia Sister   ? COPD Sister   ? Depression Brother   ? Heart Problems Brother   ? Cancer Brother   ? Cancer Brother   ? Cancer Brother   ? Breast  cancer Maternal Aunt   ? Depression Son   ? ? ?Social History:  ?Social History  ? ?Socioeconomic History  ? Marital status: Married  ?  Spouse name: Abe People  ? Number of children: Not on file  ? Years of education: Not on file  ? Highest education level: Not on file  ?Occupational History  ?  Comment: disabled  ?Tobacco Use  ? Smoking status: Every Day  ?  Packs/day: 1.00  ?  Years: 40.00  ?  Pack years: 40.00  ?  Types: Cigarettes  ?  Start date: 05/05/1975  ? Smokeless tobacco: Never  ?Vaping Use  ? Vaping Use: Never used  ?Substance and Sexual Activity  ? Alcohol use: No  ?  Alcohol/week: 0.0 standard drinks  ?  Comment: socially  ? Drug use: No  ? Sexual activity: Not Currently  ?Other Topics Concern  ? Not on file  ?Social History Narrative  ? Not on file  ? ?Social Determinants of Health  ? ?Financial Resource Strain: Not on file  ?Food Insecurity: Not on file  ?Transportation Needs: Not on file  ?Physical Activity: Not on file  ?Stress: Not on file  ?Social Connections: Not on file  ? ? ?Allergies:  ?Allergies  ?Allergen Reactions  ? Diclofenac-Misoprostol Other (See Comments)  ?  Arthrotec - gastritis   ? Nsaids Other (See Comments)  ?  gastritis  ? Zolpidem Other (See Comments)  ?  Sleep walking   ? Hydrocodone-Acetaminophen Nausea And Vomiting  ? Simvastatin Other (See Comments)  ?  body aches  ? ? ?Metabolic Disorder Labs: ?Lab Results  ?Component Value Date  ? HGBA1C 6.8 (H) 08/20/2021  ? MPG 148.46 08/20/2021  ? MPG 136.98 01/02/2019  ? ?No results found for: PROLACTIN ?No results found for: CHOL, TRIG, HDL, CHOLHDL, VLDL, LDLCALC ?No results found for: TSH ? ?Therapeutic Level Labs: ?No results found for: LITHIUM ?No results found for: VALPROATE ?No components found for:  CBMZ ? ?Current Medications: ?Current Outpatient Medications  ?Medication Sig Dispense Refill  ? ACCU-CHEK FASTCLIX LANCETS MISC     ? ACCU-CHEK SMARTVIEW test strip     ? albuterol (VENTOLIN HFA) 108 (90 Base) MCG/ACT inhaler Inhale 2  puffs into the lungs every 4 (four) hours as needed for wheezing or shortness of breath.     ? amLODipine (NORVASC) 10 MG tablet Take 10 mg by mouth  daily.     ? carbidopa-levodopa (SINEMET IR) 25-100 MG tablet Take 1 tablet by mouth 3 (three) times daily.    ? cyanocobalamin 1000 MCG tablet Take 1 tablet by mouth daily.    ? dextromethorphan-guaiFENesin (MUCINEX DM) 30-600 MG 12hr tablet Take 1 tablet by mouth 2 (two) times daily. 30 tablet 1  ? esomeprazole (NEXIUM) 40 MG capsule Take 40 mg by mouth daily.    ? feeding supplement (ENSURE ENLIVE / ENSURE PLUS) LIQD Take 237 mLs by mouth 2 (two) times daily between meals. 237 mL 12  ? ferrous sulfate 325 (65 FE) MG tablet Take 1 tablet (325 mg total) by mouth 2 (two) times daily with a meal. 30 tablet 3  ? Fluticasone-Umeclidin-Vilant 100-62.5-25 MCG/INH AEPB Inhale 1 puff into the lungs daily. Treligy    ? ipratropium-albuterol (DUONEB) 0.5-2.5 (3) MG/3ML SOLN Take 3 mLs by nebulization every 6 (six) hours. 360 mL 0  ? meclizine (ANTIVERT) 25 MG tablet Take by mouth.    ? metFORMIN (GLUCOPHAGE) 500 MG tablet Take 1 tablet (500 mg total) by mouth 2 (two) times daily with a meal. 60 tablet 1  ? metoprolol tartrate (LOPRESSOR) 25 MG tablet Take 25 mg by mouth 2 (two) times daily.     ? mirtazapine (REMERON) 7.5 MG tablet Take 1 tablet (7.5 mg total) by mouth at bedtime. 30 tablet 1  ? Multiple Vitamin (MULTIVITAMIN) tablet Take 1 tablet by mouth daily.    ? nicotine (NICODERM CQ - DOSED IN MG/24 HOURS) 21 mg/24hr patch Place 1 patch (21 mg total) onto the skin daily. 28 patch 0  ? ondansetron (ZOFRAN) 4 MG tablet Take 1 tablet (4 mg total) by mouth every 6 (six) hours as needed for nausea. 20 tablet 0  ? OXYGEN Inhale 2 L into the lungs continuous.    ? pravastatin (PRAVACHOL) 80 MG tablet Take 80 mg by mouth every evening.     ? predniSONE (DELTASONE) 10 MG tablet Take 4 tablets (40 mg total) by mouth daily with breakfast. For 3 days, then take 3 tablets for next 3  days, 2 tablets for the following 3 days, 1 tablet for 3 days and then stop it 40 tablet 0  ? spironolactone (ALDACTONE) 25 MG tablet Take 25 mg by mouth daily.     ? venlafaxine XR (EFFEXOR-XR) 75 MG 24 hr cap

## 2022-01-22 ENCOUNTER — Telehealth: Payer: Self-pay

## 2022-01-22 ENCOUNTER — Encounter: Payer: Self-pay | Admitting: Psychiatry

## 2022-01-22 ENCOUNTER — Ambulatory Visit (INDEPENDENT_AMBULATORY_CARE_PROVIDER_SITE_OTHER): Payer: Medicare HMO | Admitting: Psychiatry

## 2022-01-22 VITALS — BP 127/77 | HR 85 | Temp 97.6°F | Wt 183.4 lb

## 2022-01-22 DIAGNOSIS — G4700 Insomnia, unspecified: Secondary | ICD-10-CM

## 2022-01-22 DIAGNOSIS — F331 Major depressive disorder, recurrent, moderate: Secondary | ICD-10-CM

## 2022-01-22 DIAGNOSIS — G2581 Restless legs syndrome: Secondary | ICD-10-CM

## 2022-01-22 DIAGNOSIS — F411 Generalized anxiety disorder: Secondary | ICD-10-CM | POA: Diagnosis not present

## 2022-01-22 MED ORDER — ESZOPICLONE 3 MG PO TABS: 3.0000 mg | ORAL_TABLET | Freq: Every evening | ORAL | 1 refills | Status: DC | PRN

## 2022-01-22 MED ORDER — GABAPENTIN 400 MG PO CAPS: 400.0000 mg | ORAL_CAPSULE | Freq: Three times a day (TID) | ORAL | 0 refills | Status: DC

## 2022-01-22 MED ORDER — VENLAFAXINE HCL ER 150 MG PO CP24: 150.0000 mg | ORAL_CAPSULE | Freq: Every day | ORAL | 1 refills | Status: DC

## 2022-01-22 MED ORDER — MIRTAZAPINE 7.5 MG PO TABS: 7.5000 mg | ORAL_TABLET | Freq: Every day | ORAL | 1 refills | Status: DC

## 2022-01-22 NOTE — Patient Instructions (Signed)
Continue venlafaxine 225 mg daily ?Start mirtazapine 7.5 mg at night  ?Continue gabapentin 400 mg three times a day  ?Continue eszopiclone 3 mg at night as needed for sleep ?Next appointment- 6/1 at 10:30, in person ? ?The next visit will be in person visit. Please arrive 15 mins before the scheduled time.  ? ?Suzanne Glenn  ?Address: [address], [place], [place] [address]   ?

## 2022-01-22 NOTE — Telephone Encounter (Signed)
went online and submitted the prior auth - pending ?

## 2022-01-22 NOTE — Telephone Encounter (Signed)
prior Josem Kaufmann was approved from 10-15-21 to  12-31-*23  PA Case # [id number] ?

## 2022-01-22 NOTE — Telephone Encounter (Signed)
received fax that prior auth for eszopiclone 3mg  was good until 10-14-22 ?

## 2022-01-22 NOTE — Telephone Encounter (Signed)
received email that a prior auth was needed for pt eszopiclone 3mg . ?

## 2022-01-24 DIAGNOSIS — E782 Mixed hyperlipidemia: Secondary | ICD-10-CM | POA: Diagnosis not present

## 2022-01-24 DIAGNOSIS — I10 Essential (primary) hypertension: Secondary | ICD-10-CM | POA: Diagnosis not present

## 2022-01-24 DIAGNOSIS — I6523 Occlusion and stenosis of bilateral carotid arteries: Secondary | ICD-10-CM | POA: Diagnosis not present

## 2022-01-24 DIAGNOSIS — I2510 Atherosclerotic heart disease of native coronary artery without angina pectoris: Secondary | ICD-10-CM | POA: Diagnosis not present

## 2022-02-28 ENCOUNTER — Encounter: Payer: Self-pay | Admitting: Radiation Oncology

## 2022-02-28 ENCOUNTER — Ambulatory Visit
Admission: RE | Admit: 2022-02-28 | Discharge: 2022-02-28 | Disposition: A | Discharge status: Home / Self Care | Payer: Medicare HMO | Source: Ambulatory Visit | Attending: Radiation Oncology | Admitting: Radiation Oncology

## 2022-02-28 VITALS — BP 130/72 | HR 56 | Temp 97.6°F | Resp 24 | Ht 63.0 in | Wt 184.4 lb

## 2022-02-28 DIAGNOSIS — Z85118 Personal history of other malignant neoplasm of bronchus and lung: Secondary | ICD-10-CM | POA: Insufficient documentation

## 2022-02-28 DIAGNOSIS — Z923 Personal history of irradiation: Secondary | ICD-10-CM | POA: Insufficient documentation

## 2022-02-28 DIAGNOSIS — C3492 Malignant neoplasm of unspecified part of left bronchus or lung: Secondary | ICD-10-CM

## 2022-02-28 NOTE — Progress Notes (Signed)
Radiation Oncology ?Follow up Note ? ?Name: Suzanne Glenn   ?Date:   02/28/2022 ?MRN:  [id number] ?DOB: 1951/05/05  ? ? ?This 71 y.o. female presents to the clinic today for 2-year follow-up status post radiation therapy to her left upper lobe for stage IIb squamous cell carcinoma.. ? ?REFERRING PROVIDER: Tracie Harrier, MD ? 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  Seen today in routine follow-up she is doing well.  She does have a clear cough.  No hemoptysis.  She is on chronic nasal oxygen..  She had a CT scan back in February which I have reviewed showed regression of previously noted subcarinal lymphadenopathy.  Tiny pulmonary nodules also generally stable she does have postradiation changes in the lingula of the left upper lobe. ? ?COMPLICATIONS OF TREATMENT: none ? ?FOLLOW UP COMPLIANCE: keeps appointments  ? ?PHYSICAL EXAM:  ?BP 130/72 (BP Location: Right Arm, Patient Position: Sitting, Cuff Size: Normal)   Pulse (!) 56   Temp 97.6 ?F (36.4 ?C) (Tympanic)   Resp (!) 24   Ht 5\' 3"  (1.6 m) Comment: stated ht.  Wt 184 lb 6.4 oz (83.6 kg)   SpO2 94% Comment: 3 liters  BMI 32.66 kg/m?  ?Well-developed well-nourished patient in NAD. HEENT reveals PERLA, EOMI, discs not visualized.  Oral cavity is clear. No oral mucosal lesions are identified. Neck is clear without evidence of cervical or supraclavicular adenopathy. Lungs are clear to A&P. Cardiac examination is essentially unremarkable with regular rate and rhythm without murmur rub or thrill. Abdomen is benign with no organomegaly or masses noted. Motor sensory and DTR levels are equal and symmetric in the upper and lower extremities. Cranial nerves II through XII are grossly intact. Proprioception is intact. No peripheral adenopathy or edema is identified. No motor or sensory levels are noted. Crude visual fields are within  normal range. ? ?RADIOLOGY RESULTS: Serial CT scans reviewed compatible with above-stated findings ? ?PLAN: Patient is now 2 years out with no evidence of disease.  And pleased with her overall progress.  Her can pulmonary status is stable.  Of asked to see her back in a year for follow-up.  She continues follow-up care with medical oncology.  Patient is to call with any concerns. ? ?I would like to take this opportunity to thank you for allowing me to participate in the care of your patient.. ?  ? Noreene Filbert, MD ? ?

## 2022-03-14 DIAGNOSIS — I10 Essential (primary) hypertension: Secondary | ICD-10-CM | POA: Diagnosis not present

## 2022-03-14 DIAGNOSIS — Z0000 Encounter for general adult medical examination without abnormal findings: Secondary | ICD-10-CM | POA: Diagnosis not present

## 2022-03-14 DIAGNOSIS — C3492 Malignant neoplasm of unspecified part of left bronchus or lung: Secondary | ICD-10-CM | POA: Diagnosis not present

## 2022-03-14 DIAGNOSIS — E119 Type 2 diabetes mellitus without complications: Secondary | ICD-10-CM | POA: Diagnosis not present

## 2022-03-14 DIAGNOSIS — F17210 Nicotine dependence, cigarettes, uncomplicated: Secondary | ICD-10-CM | POA: Diagnosis not present

## 2022-03-14 DIAGNOSIS — F324 Major depressive disorder, single episode, in partial remission: Secondary | ICD-10-CM | POA: Diagnosis not present

## 2022-03-14 DIAGNOSIS — Z6833 Body mass index (BMI) 33.0-33.9, adult: Secondary | ICD-10-CM | POA: Diagnosis not present

## 2022-03-14 DIAGNOSIS — J9611 Chronic respiratory failure with hypoxia: Secondary | ICD-10-CM | POA: Diagnosis not present

## 2022-03-14 DIAGNOSIS — K219 Gastro-esophageal reflux disease without esophagitis: Secondary | ICD-10-CM | POA: Diagnosis not present

## 2022-03-15 ENCOUNTER — Ambulatory Visit (INDEPENDENT_AMBULATORY_CARE_PROVIDER_SITE_OTHER): Payer: Medicare HMO | Admitting: Psychiatry

## 2022-03-15 ENCOUNTER — Encounter: Payer: Self-pay | Admitting: Psychiatry

## 2022-03-15 VITALS — BP 140/81 | HR 100 | Temp 97.8°F | Wt 186.8 lb

## 2022-03-15 DIAGNOSIS — G4700 Insomnia, unspecified: Secondary | ICD-10-CM

## 2022-03-15 DIAGNOSIS — F3341 Major depressive disorder, recurrent, in partial remission: Secondary | ICD-10-CM

## 2022-03-15 DIAGNOSIS — F411 Generalized anxiety disorder: Secondary | ICD-10-CM

## 2022-03-15 MED ORDER — RAMELTEON 8 MG PO TABS: 8.0000 mg | ORAL_TABLET | Freq: Every evening | ORAL | 1 refills | Status: DC | PRN

## 2022-03-15 MED ORDER — MIRTAZAPINE 7.5 MG PO TABS: 7.5000 mg | ORAL_TABLET | Freq: Every day | ORAL | 0 refills | Status: DC

## 2022-03-15 NOTE — Progress Notes (Signed)
BH MD/PA/NP OP Progress Note  03/15/2022 10:52 AM Suzanne Glenn  MRN:  [id number]  Chief Complaint:  Chief Complaint  Patient presents with   Follow-up   HPI:  This is a follow-up appointment for depression and insomnia.  She states that she has been doing better and more active.  She enjoys doing puzzle book, she has not done for a while.  She thinks that there is a turnaround.  She removed seven decayed tooth.  She is planning to to get follow-up appointment with her dentist.  She also loves that she heard there is no issues with her lung and recent visit with her radiologist.  She also wonders if mirtazapine has helped her mood.  She enjoys being with her grandchild.  She has fair relationship with her husband.  She goes to the park with them.  She wishes to have independence as she needs to ask him to bring her to places as her husband does not want her to drive.  She continues to have middle insomnia.  She struggles with this, and wants to try other medication for insomnia.  She is unable to eat due to her dental issues.  She denies feeling depressed or anxious.  She denies panic attacks.  She denies SI.   Daily routine: household chores, sees her son every day with 32 yo grandchildren  Exercise: Employment: used to work at [employer], [place] in [place]: husband  Household: husband in mobile home Marital status: married for 73 years Number of children: 2. (Her daughter passed away in 01-08-2002 from diabetes at 70 year old) She grew up in [place]. She used to have a very close family. There is some discordance with one of her sisters   Wt Readings from Last 3 Encounters:  03/15/22 186 lb 12.8 oz (84.7 kg)  02/28/22 184 lb 6.4 oz (83.6 kg)  01/22/22 183 lb 6.4 oz (83.2 kg)     Visit Diagnosis:    ICD-10-CM   1. MDD (major depressive disorder), recurrent, in partial remission ([place])  F33.41     2. GAD (generalized anxiety disorder)  F41.1     3. Insomnia, unspecified type  G47.00        Past Psychiatric History: Please see initial evaluation for full details. I have reviewed the history. No updates at this time.     Past Medical History:  Past Medical History:  Diagnosis Date   Anxiety    Asthma    Cancer ([place]) January 09, 2019   w/u for right upper lobe mass/cancer   COPD (chronic obstructive pulmonary disease) (HCC)    also, emphysema. now using o2 via np 24 hours a day   Coronary artery disease    Depression    Diabetes mellitus, type II (HCC)    GERD (gastroesophageal reflux disease)    Headache    migraines in early Jan 09, 2023   Heart murmur    Hypertension    Lung cancer ([place])    Neuropathy    Restless leg     Past Surgical History:  Procedure Laterality Date   BACK SURGERY  09-Jan-2004   surgery x 2, disc fused in neck, pinched nerve in center of back and neck   CARDIAC CATHETERIZATION  01-08-14   1 stent placed for blockage   cervical bone infusion     CHOLECYSTECTOMY     THORACIC DISC SURGERY     TUBAL LIGATION     VIDEO BRONCHOSCOPY WITH ENDOBRONCHIAL ULTRASOUND N/A 03/06/2019   Procedure:  VIDEO BRONCHOSCOPY WITH ENDOBRONCHIAL ULTRASOUND - DIABETIC;  Surgeon: Ottie Glazier, MD;  Location: [facility] ORS;  Service: Thoracic;  Laterality: N/A;    Family Psychiatric History: Please see initial evaluation for full details. I have reviewed the history. No updates at this time.     Family History:  Family History  Problem Relation Age of Onset   Anxiety disorder Mother    Cancer - Lung Mother    Depression Sister    Cancer Sister    Cancer Sister    Cancer Sister    Cancer Sister    Heart Problems Sister    Bipolar disorder Sister    Diabetes Sister    Cancer - Lung Sister    Hypertension Sister    Diabetes Sister    Hyperlipidemia Sister    COPD Sister    Depression Brother    Heart Problems Brother    Cancer Brother    Cancer Brother    Cancer Brother    Breast cancer Maternal Aunt    Depression Son     Social History:  Social History    Socioeconomic History   Marital status: Married    Spouse name: Abe People   Number of children: Not on file   Years of education: Not on file   Highest education level: Not on file  Occupational History    Comment: disabled  Tobacco Use   Smoking status: Every Day    Packs/day: 1.00    Years: 40.00    Pack years: 40.00    Types: Cigarettes    Start date: 05/05/1975   Smokeless tobacco: Never  Vaping Use   Vaping Use: Never used  Substance and Sexual Activity   Alcohol use: No    Alcohol/week: 0.0 standard drinks    Comment: socially   Drug use: No   Sexual activity: Not Currently  Other Topics Concern   Not on file  Social History Narrative   Not on file   Social Determinants of Health   Financial Resource Strain: Not on file  Food Insecurity: Not on file  Transportation Needs: Not on file  Physical Activity: Not on file  Stress: Not on file  Social Connections: Not on file    Allergies:  Allergies  Allergen Reactions   Diclofenac-Misoprostol Other (See Comments)    Arthrotec - gastritis    Nsaids Other (See Comments)    gastritis   Zolpidem Other (See Comments)    Sleep walking    Hydrocodone-Acetaminophen Nausea And Vomiting   Simvastatin Other (See Comments)    body aches    Metabolic Disorder Labs: Lab Results  Component Value Date   HGBA1C 6.8 (H) 08/20/2021   MPG 148.46 08/20/2021   MPG 136.98 01/02/2019   No results found for: PROLACTIN No results found for: CHOL, TRIG, HDL, CHOLHDL, VLDL, LDLCALC No results found for: TSH  Therapeutic Level Labs: No results found for: LITHIUM No results found for: VALPROATE No components found for:  CBMZ  Current Medications: Current Outpatient Medications  Medication Sig Dispense Refill   ACCU-CHEK FASTCLIX LANCETS MISC      ACCU-CHEK SMARTVIEW test strip      albuterol (VENTOLIN HFA) 108 (90 Base) MCG/ACT inhaler Inhale 2 puffs into the lungs every 4 (four) hours as needed for wheezing or shortness of  breath.      amLODipine (NORVASC) 10 MG tablet Take 10 mg by mouth daily.      carbidopa-levodopa (SINEMET IR) 25-100 MG tablet Take 1 tablet by  mouth 3 (three) times daily.     cyanocobalamin 1000 MCG tablet Take 1 tablet by mouth daily.     dextromethorphan-guaiFENesin (MUCINEX DM) 30-600 MG 12hr tablet Take 1 tablet by mouth 2 (two) times daily. 30 tablet 1   esomeprazole (NEXIUM) 40 MG capsule Take 40 mg by mouth daily.     feeding supplement (ENSURE ENLIVE / ENSURE PLUS) LIQD Take 237 mLs by mouth 2 (two) times daily between meals. 237 mL 12   ferrous sulfate 325 (65 FE) MG tablet Take 1 tablet (325 mg total) by mouth 2 (two) times daily with a meal. 30 tablet 3   Fluticasone-Umeclidin-Vilant 100-62.5-25 MCG/INH AEPB Inhale 1 puff into the lungs daily. Treligy     gabapentin (NEURONTIN) 400 MG capsule Take 1 capsule (400 mg total) by mouth 3 (three) times daily. 270 capsule 0   ipratropium-albuterol (DUONEB) 0.5-2.5 (3) MG/3ML SOLN Take 3 mLs by nebulization every 6 (six) hours. 360 mL 0   isosorbide mononitrate (IMDUR) 30 MG 24 hr tablet Take 1 tablet by mouth daily.     meclizine (ANTIVERT) 25 MG tablet Take by mouth.     metFORMIN (GLUCOPHAGE) 500 MG tablet Take 1 tablet (500 mg total) by mouth 2 (two) times daily with a meal. 60 tablet 1   metoprolol tartrate (LOPRESSOR) 25 MG tablet Take 25 mg by mouth 2 (two) times daily.      Multiple Vitamin (MULTIVITAMIN) tablet Take 1 tablet by mouth daily.     nicotine (NICODERM CQ - DOSED IN MG/24 HOURS) 21 mg/24hr patch Place 1 patch (21 mg total) onto the skin daily. 28 patch 0   ondansetron (ZOFRAN) 4 MG tablet Take 1 tablet (4 mg total) by mouth every 6 (six) hours as needed for nausea. 20 tablet 0   OXYGEN Inhale 2 L into the lungs continuous.     pravastatin (PRAVACHOL) 80 MG tablet Take 80 mg by mouth every evening.      predniSONE (DELTASONE) 10 MG tablet Take 4 tablets (40 mg total) by mouth daily with breakfast. For 3 days, then take 3  tablets for next 3 days, 2 tablets for the following 3 days, 1 tablet for 3 days and then stop it 40 tablet 0   ramelteon (ROZEREM) 8 MG tablet Take 1 tablet (8 mg total) by mouth at bedtime as needed for sleep. 30 tablet 1   spironolactone (ALDACTONE) 25 MG tablet Take 25 mg by mouth daily.      venlafaxine XR (EFFEXOR-XR) 150 MG 24 hr capsule Take 1 capsule (150 mg total) by mouth daily. Take total of 225 mg daily, take along with 75 mg cap 90 capsule 1   venlafaxine XR (EFFEXOR-XR) 75 MG 24 hr capsule Take 1 capsule (75 mg total) by mouth daily. Take total of 225 mg daily, take along with 150 mg cap 90 capsule 1   [START ON 03/24/2022] mirtazapine (REMERON) 7.5 MG tablet Take 1 tablet (7.5 mg total) by mouth at bedtime. 90 tablet 0   No current facility-administered medications for this visit.     Musculoskeletal: Strength & Muscle Tone: within normal limits Gait & Station: normal Patient leans: N/A  Psychiatric Specialty Exam: Review of Systems  Psychiatric/Behavioral:  Positive for sleep disturbance. Negative for agitation, behavioral problems, confusion, dysphoric mood, hallucinations, self-injury and suicidal ideas. The patient is not nervous/anxious and is not hyperactive.   All other systems reviewed and are negative.  Blood pressure 140/81, pulse 100, temperature 97.8 F (36.6 C),  temperature source Temporal, weight 186 lb 12.8 oz (84.7 kg), SpO2 91 %.Body mass index is 33.09 kg/m.  General Appearance: Fairly Groomed  Eye Contact:  Good  Speech:  Clear and Coherent  Volume:  Normal  Mood:   good  Affect:  Appropriate, Congruent, and Full Range  Thought Process:  Coherent and Goal Directed  Orientation:  Full (Time, Place, and Person)  Thought Content: Logical   Suicidal Thoughts:  No  Homicidal Thoughts:  No  Memory:  Immediate;   Good  Judgement:  Good  Insight:  Good  Psychomotor Activity:  Normal  Concentration:  Concentration: Good and Attention Span: Good  Recall:   Good  Fund of Knowledge: Good  Language: Good  Akathisia:  No  Handed:  Right  AIMS (if indicated): not done  Assets:  Communication Skills Desire for Improvement  ADL's:  Intact  Cognition: WNL  Sleep:  Poor   Screenings: PHQ2-9    Flowsheet Row Office Visit from 03/15/2022 in [facility] Visit from 01/22/2022 in [facility] Office Visit from 11/20/2021 in [facility] Video Visit from 05/31/2021 in [facility] Video Visit from 12/22/2020 in [facility]  PHQ-2 Total Score 2 4 4  0 0  PHQ-9 Total Score 12 15 10  -- --      Flowsheet Row Office Visit from 03/15/2022 in [place] Office Visit from 11/20/2021 in [facility] from 11/17/2021 in [place] No Risk No Risk No Risk        Assessment and Plan:  DULCEMARIA BULA is a 71 y.o. 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   1. MDD (major depressive disorder), recurrent, in partial remission ([place]) 2. GAD (generalized anxiety disorder) She reports significant improvement in depressive symptoms and anxiety since starting mirtazapine.  Psychosocial stressors includes her medical condition of COPD on oxygen,  loss of her daughter from diabetes several years ago, and conflict with one of her sisters.  Will continue mirtazapine, venlafaxine as maintenance treatment for depression and anxiety.   3. Insomnia, unspecified type Unchanged.  Will switch from Lunesta to ramelteon to  see if it is more effective for insomnia. Noted that she does have snoring; she was advised to discuss with her pulmonologist to see if she will benefit from sleep study.    4. Restless leg syndrome Unchanged. She reports good benefit from gabapentin.  Will continue current dose to target restless leg. She is reportedly checked ferritin by her PCP and takes iron pills.    This clinician has discussed the side effect associated with medication prescribed during this encounter. Please refer to notes in the previous encounters for more details.     Plan Continue venlafaxine 225 mg daily Continue mirtazapine 7.5 mg at night  Continue gabapentin 400 mg three times a day (facial numbness from 600 mg TID) Discontinue eszopiclone Start ramelteon 8 mg at night as needed for sleep Next appointment- 7/6 at 11:30 for 30 mins, in person - on metoprolol, sinemet for restless leg  PCP: [facility]   Past trials of medication: trazodone, doxepin, lunesta, Ambien (sleepwalking), temazepam  This clinician has discussed the side effect associated with medication prescribed during this encounter. Please refer to notes in the previous encounters for more details.     The patient demonstrates the following risk factors for suicide: Chronic risk factors for suicide include: psychiatric disorder of depression . Acute risk factors for suicide include: family or marital conflict, unemployment, and loss (financial, interpersonal, professional). Protective factors for this patient include: positive social support. Considering these factors, the overall suicide risk at this point appears to be low. Patient is appropriate for outpatient follow up.      Collaboration of Care: Collaboration of Care: Other N/A  Patient/Guardian was advised Release of Information must be obtained prior to any record release in order to collaborate their care with an outside provider. Patient/Guardian was advised if they have not  already done so to contact the registration department to sign all necessary forms in order for [place] to release information regarding their care.   Consent: Patient/Guardian gives verbal consent for treatment and assignment of benefits for services provided during this visit. Patient/Guardian expressed understanding and agreed to proceed.    Norman Clay, MD 03/15/2022, 10:52 AM

## 2022-03-16 ENCOUNTER — Telehealth: Payer: Self-pay | Admitting: Psychiatry

## 2022-03-16 NOTE — Telephone Encounter (Signed)
Received a fax from pharmacy that they prefer Trazodone (tried), Ambien (tried), or Belsomra for sleep. Could you ask her if she is willing to try belsomra instead of ramelteon for sleep? Thanks

## 2022-03-16 NOTE — Telephone Encounter (Signed)
left message to call office back.  need to find out if she will try the belsomra

## 2022-03-19 ENCOUNTER — Other Ambulatory Visit: Payer: Self-pay | Admitting: Psychiatry

## 2022-04-03 ENCOUNTER — Encounter: Payer: Self-pay | Admitting: Internal Medicine

## 2022-04-04 ENCOUNTER — Ambulatory Visit: Admission: RE | Admit: 2022-04-04 | Payer: Medicare HMO | Source: Home / Self Care | Admitting: Internal Medicine

## 2022-04-04 ENCOUNTER — Encounter: Admission: RE | Payer: Self-pay | Source: Home / Self Care

## 2022-04-04 HISTORY — DX: Hyperlipidemia, unspecified: E78.5

## 2022-04-04 SURGERY — COLONOSCOPY
Anesthesia: General

## 2022-04-16 NOTE — Progress Notes (Unsigned)
BH MD/PA/NP OP Progress Note  04/19/2022 12:07 PM Suzanne Glenn  MRN:  [id number]  Chief Complaint:  Chief Complaint  Patient presents with   Follow-up   Depression   HPI:  This is a follow-up appointment for depression, anxiety and restless leg.  She states that she has been sleeping better since the last visit.  She feels good in the morning.  She talks about some frustration of her colonoscopy being postponed.  She loves kids, and enjoys taking care of her step child's baby.  She gets out in the deck when the weather is good.  She states that she talks to herself while she is asleep.  She denies any sleep walking.  She denies any dizziness or fall.  She denies feeling depressed, anxiety.  She denies change in appetite.  She denies SI.  She denies panic attacks.  She feels comfortable to stay on the current medication regimen.     Daily routine: household chores, sees her son every day with 61 yo grandchildren  Exercise: Employment: used to work at [employer], [place]: husband  Household: husband in mobile home Marital status: married for 30 years Number of children: 2. (Her daughter passed away in 2002-01-07 from diabetes at 70 year old) She grew up in [place]. She used to have a very close family. There is some discordance with one of her sisters   Wt Readings from Last 3 Encounters:  04/19/22 188 lb (85.3 kg)  03/15/22 186 lb 12.8 oz (84.7 kg)  02/28/22 184 lb 6.4 oz (83.6 kg)     Visit Diagnosis:    ICD-10-CM   1. MDD (major depressive disorder), recurrent, in partial remission ([place])  F33.41     2. GAD (generalized anxiety disorder)  F41.1     3. Insomnia, unspecified type  G47.00     4. Restless leg syndrome  G25.81       Past Psychiatric History: Please see initial evaluation for full details. I have reviewed the history. No updates at this time.     Past Medical History:  Past Medical History:  Diagnosis Date   Anxiety    Asthma    Cancer ([place]) Jan 08, 2019    w/u for right upper lobe mass/cancer   COPD (chronic obstructive pulmonary disease) (HCC)    also, emphysema. now using o2 via np 24 hours a day   Coronary artery disease    Depression    Diabetes mellitus, type II (HCC)    GERD (gastroesophageal reflux disease)    Headache    migraines in early 01-08-23   Heart murmur    HLD (hyperlipidemia)    Hypertension    Lung cancer ([place])    Neuropathy    Restless leg     Past Surgical History:  Procedure Laterality Date   BACK SURGERY  01-08-2004   surgery x 2, disc fused in neck, pinched nerve in center of back and neck   CARDIAC CATHETERIZATION  2014/01/07   1 stent placed for blockage   cervical bone infusion     CHOLECYSTECTOMY     DILATION AND CURETTAGE OF UTERUS     FOOT SURGERY Left    foot bone spur removed   THORACIC DISC SURGERY     TUBAL LIGATION     VIDEO BRONCHOSCOPY WITH ENDOBRONCHIAL ULTRASOUND N/A 03/06/2019   Procedure: VIDEO BRONCHOSCOPY WITH ENDOBRONCHIAL ULTRASOUND - DIABETIC;  Surgeon: Ottie Glazier, MD;  Location: [facility] ORS;  Service: Thoracic;  Laterality: N/A;  Family Psychiatric History: Please see initial evaluation for full details. I have reviewed the history. No updates at this time.     Family History:  Family History  Problem Relation Age of Onset   Anxiety disorder Mother    Cancer - Lung Mother    Depression Sister    Cancer Sister    Cancer Sister    Cancer Sister    Cancer Sister    Heart Problems Sister    Bipolar disorder Sister    Diabetes Sister    Cancer - Lung Sister    Hypertension Sister    Diabetes Sister    Hyperlipidemia Sister    COPD Sister    Depression Brother    Heart Problems Brother    Cancer Brother    Cancer Brother    Cancer Brother    Breast cancer Maternal Aunt    Depression Son     Social History:  Social History   Socioeconomic History   Marital status: Married    Spouse name: Abe People   Number of children: Not on file   Years of education: Not on file   Highest  education level: Not on file  Occupational History    Comment: disabled  Tobacco Use   Smoking status: Every Day    Packs/day: 1.00    Years: 40.00    Total pack years: 40.00    Types: Cigarettes    Start date: 05/05/1975   Smokeless tobacco: Never  Vaping Use   Vaping Use: Never used  Substance and Sexual Activity   Alcohol use: No    Alcohol/week: 0.0 standard drinks of alcohol    Comment: socially   Drug use: No   Sexual activity: Not Currently  Other Topics Concern   Not on file  Social History Narrative   Not on file   Social Determinants of Health   Financial Resource Strain: Not on file  Food Insecurity: Not on file  Transportation Needs: Not on file  Physical Activity: Not on file  Stress: Not on file  Social Connections: Not on file    Allergies:  Allergies  Allergen Reactions   Diclofenac-Misoprostol Other (See Comments)    Arthrotec - gastritis    Nsaids Other (See Comments)    gastritis   Zolpidem Other (See Comments)    Sleep walking    Hydrocodone-Acetaminophen Nausea And Vomiting   Simvastatin Other (See Comments)    body aches    Metabolic Disorder Labs: Lab Results  Component Value Date   HGBA1C 6.8 (H) 08/20/2021   MPG 148.46 08/20/2021   MPG 136.98 01/02/2019   No results found for: "PROLACTIN" No results found for: "CHOL", "TRIG", "HDL", "CHOLHDL", "VLDL", "LDLCALC" No results found for: "TSH"  Therapeutic Level Labs: No results found for: "LITHIUM" No results found for: "VALPROATE" No results found for: "CBMZ"  Current Medications: Current Outpatient Medications  Medication Sig Dispense Refill   ACCU-CHEK FASTCLIX LANCETS MISC      ACCU-CHEK SMARTVIEW test strip      albuterol (VENTOLIN HFA) 108 (90 Base) MCG/ACT inhaler Inhale 2 puffs into the lungs every 4 (four) hours as needed for wheezing or shortness of breath.      amLODipine (NORVASC) 10 MG tablet Take 10 mg by mouth daily.      aspirin 81 MG chewable tablet Chew by  mouth daily.     carbidopa-levodopa (SINEMET IR) 25-100 MG tablet Take 1 tablet by mouth 3 (three) times daily.     cyanocobalamin  1000 MCG tablet Take 1 tablet by mouth daily.     dextromethorphan-guaiFENesin (MUCINEX DM) 30-600 MG 12hr tablet Take 1 tablet by mouth 2 (two) times daily. 30 tablet 1   esomeprazole (NEXIUM) 40 MG capsule Take 40 mg by mouth daily.     feeding supplement (ENSURE ENLIVE / ENSURE PLUS) LIQD Take 237 mLs by mouth 2 (two) times daily between meals. 237 mL 12   ferrous sulfate 325 (65 FE) MG tablet Take 1 tablet (325 mg total) by mouth 2 (two) times daily with a meal. 30 tablet 3   Fluticasone-Umeclidin-Vilant 100-62.5-25 MCG/INH AEPB Inhale 1 puff into the lungs daily. [place]     [START ON 06/01/2022] gabapentin (NEURONTIN) 400 MG capsule Take 1 capsule (400 mg total) by mouth 3 (three) times daily. 270 capsule 0   ipratropium-albuterol (DUONEB) 0.5-2.5 (3) MG/3ML SOLN Take 3 mLs by nebulization every 6 (six) hours. 360 mL 0   isosorbide mononitrate (IMDUR) 30 MG 24 hr tablet Take 1 tablet by mouth daily.     meclizine (ANTIVERT) 25 MG tablet Take by mouth.     metFORMIN (GLUCOPHAGE) 500 MG tablet Take 1 tablet (500 mg total) by mouth 2 (two) times daily with a meal. 60 tablet 1   metoprolol tartrate (LOPRESSOR) 25 MG tablet Take 25 mg by mouth 2 (two) times daily.      [START ON 06/23/2022] mirtazapine (REMERON) 7.5 MG tablet Take 1 tablet (7.5 mg total) by mouth at bedtime. 90 tablet 0   Multiple Vitamin (MULTIVITAMIN) tablet Take 1 tablet by mouth daily.     nicotine (NICODERM CQ - DOSED IN MG/24 HOURS) 21 mg/24hr patch Place 1 patch (21 mg total) onto the skin daily. 28 patch 0   ondansetron (ZOFRAN) 4 MG tablet Take 1 tablet (4 mg total) by mouth every 6 (six) hours as needed for nausea. 20 tablet 0   OXYGEN Inhale 2 L into the lungs continuous.     pravastatin (PRAVACHOL) 80 MG tablet Take 80 mg by mouth every evening.      predniSONE (DELTASONE) 10 MG tablet Take  4 tablets (40 mg total) by mouth daily with breakfast. For 3 days, then take 3 tablets for next 3 days, 2 tablets for the following 3 days, 1 tablet for 3 days and then stop it 40 tablet 0   spironolactone (ALDACTONE) 25 MG tablet Take 25 mg by mouth daily.      venlafaxine XR (EFFEXOR-XR) 150 MG 24 hr capsule Take 1 capsule (150 mg total) by mouth daily. Take total of 225 mg daily, take along with 75 mg cap 90 capsule 1   [START ON 06/05/2022] venlafaxine XR (EFFEXOR-XR) 75 MG 24 hr capsule Take 1 capsule (75 mg total) by mouth daily. Take total of 225 mg daily, take along with 150 mg cap 90 capsule 1   No current facility-administered medications for this visit.     Musculoskeletal: Strength & Muscle Tone: within normal limits Gait & Station: normal- uses cane Patient leans: N/A  Psychiatric Specialty Exam: Review of Systems  Psychiatric/Behavioral: Negative.    All other systems reviewed and are negative.   Blood pressure 134/66, pulse 88, height 5\' 3"  (1.6 m), weight 188 lb (85.3 kg), SpO2 90 %.Body mass index is 33.3 kg/m.  General Appearance: Fairly Groomed  Eye Contact:  Good  Speech:  Clear and Coherent  Volume:  Normal  Mood:   good  Affect:  Appropriate, Congruent, and calm  Thought Process:  Coherent  Orientation:  Full (Time, Place, and Person)  Thought Content: Logical   Suicidal Thoughts:  No  Homicidal Thoughts:  No  Memory:  Immediate;   Good  Judgement:  Good  Insight:  Good  Psychomotor Activity:  Normal  Concentration:  Concentration: Good and Attention Span: Good  Recall:  Good  Fund of Knowledge: Good  Language: Good  Akathisia:  No  Handed:  Right  AIMS (if indicated): not done  Assets:  Communication Skills Desire for Improvement  ADL's:  Intact  Cognition: WNL  Sleep:  Fair   Screenings: IT sales professional Office Visit from 04/19/2022 in [place] Office Visit from 03/15/2022 in [place] Office Visit from 01/22/2022 in [place] Office Visit from 11/20/2021 in [place] Video Visit from 05/31/2021 in [place]  PHQ-2 Total Score 0 2 4 4  0  PHQ-9 Total Score 0 12 15 10  --      Flowsheet Row Office Visit from 04/19/2022 in [place] Office Visit from 03/15/2022 in [place] Office Visit from 11/20/2021 in [place] No Risk No Risk No Risk        Assessment and Plan:  Suzanne Glenn is a 71 y.o. 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    1. MDD (major depressive disorder), recurrent, in partial remission ([place]) 2. GAD (generalized anxiety disorder) 3. Insomnia, unspecified type There has been significant improvement in depressive symptoms, anxiety and insomnia since starting mirtazapine. Psychosocial stressors includes her medical condition of COPD on oxygen,  loss of her daughter from diabetes several years ago, and conflict with one of her sisters.  She enjoys taking care of her grandchildren.  Will continue mirtazapine, venlafaxine as maintenance treatment for depression and anxiety. Noted that she reports talking to herself while asleep, although she denies any other behaviors consistent with sleep walking. Will continue to assess and adjust medication accordingly.   4. Restless leg syndrome Improving.  She reports good benefit from gabapentin.  Will continue current dose to target restless leg. She takes iron pills, and is reportedly checked ferritin by her PCP.      Plan Continue venlafaxine 225 mg daily Continue mirtazapine 7.5 mg at night  Continue gabapentin 400 mg three times a day (facial numbness from 600 mg TID) Discontinue ramelteon (unable to get it from the pharmacy) Next appointment-  9/28 at 11 AM, in person - on metoprolol, sinemet for restless leg  [facility]   Past trials of medication: trazodone, doxepin, lunesta, Ambien (sleepwalking), temazepam   This clinician has discussed the side effect associated with medication prescribed during this encounter. Please refer to notes in the previous encounters for more details.     The patient demonstrates the following risk factors for suicide: Chronic risk factors for suicide include: psychiatric disorder of depression . Acute risk factors for suicide include: family or marital conflict, unemployment, and loss (financial, interpersonal, professional). Protective factors for this patient include: positive social support. Considering these factors, the overall suicide risk at this point appears to be low. Patient is appropriate for outpatient follow up.  Collaboration of Care: Collaboration of Care: Other N/A  Patient/Guardian was advised Release of Information must be obtained prior to any record release in order to collaborate their care with an outside provider. Patient/Guardian was advised if they have not already done so to contact the registration department to sign all necessary forms in order for [place] to release information regarding their care.   Consent: Patient/Guardian gives verbal consent for treatment and assignment of benefits for services provided during this visit. Patient/Guardian expressed understanding and agreed to proceed.    Norman Clay, MD 04/19/2022, 12:07 PM

## 2022-04-19 ENCOUNTER — Ambulatory Visit (INDEPENDENT_AMBULATORY_CARE_PROVIDER_SITE_OTHER): Payer: Medicare HMO | Admitting: Psychiatry

## 2022-04-19 ENCOUNTER — Encounter: Payer: Self-pay | Admitting: Psychiatry

## 2022-04-19 VITALS — BP 134/66 | HR 88 | Ht 63.0 in | Wt 188.0 lb

## 2022-04-19 DIAGNOSIS — G4700 Insomnia, unspecified: Secondary | ICD-10-CM

## 2022-04-19 DIAGNOSIS — F3341 Major depressive disorder, recurrent, in partial remission: Secondary | ICD-10-CM | POA: Diagnosis not present

## 2022-04-19 DIAGNOSIS — G2581 Restless legs syndrome: Secondary | ICD-10-CM | POA: Diagnosis not present

## 2022-04-19 DIAGNOSIS — F411 Generalized anxiety disorder: Secondary | ICD-10-CM | POA: Diagnosis not present

## 2022-04-19 MED ORDER — GABAPENTIN 400 MG PO CAPS
400.0000 mg | ORAL_CAPSULE | Freq: Three times a day (TID) | ORAL | 0 refills | Status: DC
Start: 2022-06-01 — End: 2022-07-12

## 2022-04-19 MED ORDER — VENLAFAXINE HCL ER 75 MG PO CP24: 75.0000 mg | ORAL_CAPSULE | Freq: Every day | ORAL | 1 refills | Status: AC

## 2022-04-19 MED ORDER — MIRTAZAPINE 7.5 MG PO TABS
7.5000 mg | ORAL_TABLET | Freq: Every day | ORAL | 0 refills | Status: DC
Start: 2022-06-23 — End: 2022-09-09

## 2022-05-14 DIAGNOSIS — J449 Chronic obstructive pulmonary disease, unspecified: Secondary | ICD-10-CM | POA: Diagnosis not present

## 2022-05-14 DIAGNOSIS — R0609 Other forms of dyspnea: Secondary | ICD-10-CM | POA: Diagnosis not present

## 2022-05-16 ENCOUNTER — Other Ambulatory Visit: Payer: Self-pay | Admitting: Psychiatry

## 2022-05-17 NOTE — Progress Notes (Signed)
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  INTERVAL HISTORY: Patient returns to clinic today for routine 70-monthevaluation and discussion of her imaging results.  She currently feels well and is asymptomatic.  She does not complain of any dizziness today.  She continues to have chronic shortness of breath and requires oxygen 24 hours a day.  She has no neurologic complaints.  She denies any recent fevers or illnesses.  She has a good appetite and denies weight loss.  She denies any pain.  She has no chest pain, cough, or hemoptysis.  She denies any nausea, vomiting, constipation, or diarrhea.  She has no urinary complaints.  Patient offers no further specific complaints today.  REVIEW OF SYSTEMS:   Review of Systems  Constitutional: Negative.  Negative for fever, malaise/fatigue and weight loss.  Respiratory:  Positive for shortness of breath. Negative for cough and hemoptysis.   Cardiovascular: Negative.  Negative for chest pain and leg swelling.  Gastrointestinal: Negative.  Negative for abdominal pain.  Genitourinary: Negative.  Negative for dysuria.  Musculoskeletal: Negative.  Negative for back pain.  Skin: Negative.  Negative for rash.  Neurological: Negative.  Negative for dizziness, weakness and headaches.  Psychiatric/Behavioral: Negative.  The patient is not nervous/anxious.     As per HPI. Otherwise, a complete review of systems is negative.  PAST MEDICAL HISTORY: Past Medical History:  Diagnosis Date   Anxiety    Asthma    Cancer ([facility] 12/2018   w/u for right upper lobe  mass/cancer   COPD (chronic obstructive pulmonary disease) (HCC)    also, emphysema. now using o2 via np 24 hours a day   Coronary artery disease    Depression    Diabetes mellitus, type II (HCC)    GERD (gastroesophageal reflux disease)    Headache    migraines in early 20's   Heart murmur    HLD (hyperlipidemia)    Hypertension    Lung cancer ([facility]    Neuropathy    Restless leg     PAST SURGICAL HISTORY: Past Surgical History:  Procedure Laterality Date   BACK SURGERY  2005   surgery x 2, disc fused in neck, pinched nerve in center of back and neck   CARDIAC CATHETERIZATION  2015   1 stent placed for blockage   cervical bone infusion     CHOLECYSTECTOMY     DILATION AND CURETTAGE OF UTERUS     FOOT SURGERY Left    foot bone spur removed   THORACIC DISC SURGERY     TUBAL LIGATION     VIDEO BRONCHOSCOPY WITH ENDOBRONCHIAL ULTRASOUND N/A 03/06/2019   Procedure: VIDEO BRONCHOSCOPY WITH ENDOBRONCHIAL ULTRASOUND - DIABETIC;  Surgeon: AOttie Glazier MD;  Location: [facility] ORS;  Service: Thoracic;  Laterality: N/A;    FAMILY HISTORY: Family History  Problem Relation Age of Onset   Anxiety disorder Mother    Cancer - Lung Mother    Depression Sister    Cancer Sister    Cancer Sister    Cancer Sister    Cancer Sister    Heart Problems Sister    Bipolar disorder Sister  Diabetes Sister    Cancer - Lung Sister    Hypertension Sister    Diabetes Sister    Hyperlipidemia Sister    COPD Sister    Depression Brother    Heart Problems Brother    Cancer Brother    Cancer Brother    Cancer Brother    Breast cancer Maternal Aunt    Depression Son     ADVANCED DIRECTIVES (Y/N):  N  HEALTH MAINTENANCE: Social History   Tobacco Use   Smoking status: Every Day    Packs/day: 1.00    Years: 40.00    Total pack years: 40.00    Types: Cigarettes    Start date: 05/05/1975   Smokeless tobacco: Never  Vaping Use   Vaping Use: Never used  Substance Use Topics   Alcohol  use: No    Alcohol/week: 0.0 standard drinks of alcohol    Comment: socially   Drug use: No     Colonoscopy:  PAP:  Bone density:  Lipid panel:  Allergies  Allergen Reactions   Diclofenac-Misoprostol Other (See Comments)    Arthrotec - gastritis    Nsaids Other (See Comments)    gastritis   Zolpidem Other (See Comments)    Sleep walking    Hydrocodone-Acetaminophen Nausea And Vomiting   Simvastatin Other (See Comments)    body aches    Current Outpatient Medications  Medication Sig Dispense Refill   ACCU-CHEK FASTCLIX LANCETS MISC      ACCU-CHEK SMARTVIEW test strip      albuterol (VENTOLIN HFA) 108 (90 Base) MCG/ACT inhaler Inhale 2 puffs into the lungs every 4 (four) hours as needed for wheezing or shortness of breath.      amLODipine (NORVASC) 10 MG tablet Take 10 mg by mouth daily.      aspirin 81 MG chewable tablet Chew by mouth daily.     carbidopa-levodopa (SINEMET IR) 25-100 MG tablet Take 1 tablet by mouth 3 (three) times daily.     cyanocobalamin 1000 MCG tablet Take 1 tablet by mouth daily.     esomeprazole (NEXIUM) 40 MG capsule Take 40 mg by mouth daily.     feeding supplement (ENSURE ENLIVE / ENSURE PLUS) LIQD Take 237 mLs by mouth 2 (two) times daily between meals. 237 mL 12   ferrous sulfate 325 (65 FE) MG tablet Take 1 tablet (325 mg total) by mouth 2 (two) times daily with a meal. 30 tablet 3   Fluticasone-Umeclidin-Vilant 100-62.5-25 MCG/INH AEPB Inhale 1 puff into the lungs daily. [place]     [START ON 06/01/2022] gabapentin (NEURONTIN) 400 MG capsule Take 1 capsule (400 mg total) by mouth 3 (three) times daily. 270 capsule 0   ipratropium-albuterol (DUONEB) 0.5-2.5 (3) MG/3ML SOLN Take 3 mLs by nebulization every 6 (six) hours. 360 mL 0   isosorbide mononitrate (IMDUR) 30 MG 24 hr tablet Take 1 tablet by mouth daily.     meclizine (ANTIVERT) 25 MG tablet Take by mouth.     metFORMIN (GLUCOPHAGE) 500 MG tablet Take 1 tablet (500 mg total) by mouth 2 (two)  times daily with a meal. 60 tablet 1   metoprolol tartrate (LOPRESSOR) 25 MG tablet Take 25 mg by mouth 2 (two) times daily.      [START ON 06/23/2022] mirtazapine (REMERON) 7.5 MG tablet Take 1 tablet (7.5 mg total) by mouth at bedtime. 90 tablet 0   Multiple Vitamin (MULTIVITAMIN) tablet Take 1 tablet by mouth daily.     nicotine (NICODERM CQ -  DOSED IN MG/24 HOURS) 21 mg/24hr patch Place 1 patch (21 mg total) onto the skin daily. 28 patch 0   ondansetron (ZOFRAN) 4 MG tablet Take 1 tablet (4 mg total) by mouth every 6 (six) hours as needed for nausea. 20 tablet 0   OXYGEN Inhale 2 L into the lungs continuous.     pravastatin (PRAVACHOL) 80 MG tablet Take 80 mg by mouth every evening.      predniSONE (DELTASONE) 10 MG tablet Take 4 tablets (40 mg total) by mouth daily with breakfast. For 3 days, then take 3 tablets for next 3 days, 2 tablets for the following 3 days, 1 tablet for 3 days and then stop it 40 tablet 0   spironolactone (ALDACTONE) 25 MG tablet Take 25 mg by mouth daily.      venlafaxine XR (EFFEXOR-XR) 150 MG 24 hr capsule Take 1 capsule (150 mg total) by mouth daily. Take total of 225 mg daily, take along with 75 mg cap 90 capsule 1   [START ON 06/05/2022] venlafaxine XR (EFFEXOR-XR) 75 MG 24 hr capsule Take 1 capsule (75 mg total) by mouth daily. Take total of 225 mg daily, take along with 150 mg cap 90 capsule 1   dextromethorphan-guaiFENesin (MUCINEX DM) 30-600 MG 12hr tablet Take 1 tablet by mouth 2 (two) times daily. (Patient not taking: Reported on 05/24/2022) 30 tablet 1   No current facility-administered medications for this visit.    OBJECTIVE: Vitals:   05/24/22 1024  BP: 132/65  Pulse: 89  Resp: 18  Temp: 98.7 F (37.1 C)  SpO2: 90%     Body mass index is 33.9 kg/m.    ECOG FS:0 - Asymptomatic  General: Well-developed, well-nourished, no acute distress. Eyes: Pink conjunctiva, anicteric sclera. HEENT: Normocephalic, moist mucous membranes. Lungs: No audible  wheezing or coughing. Heart: Regular rate and rhythm. Abdomen: Soft, nontender, no obvious distention. Musculoskeletal: No edema, cyanosis, or clubbing. Neuro: Alert, answering all questions appropriately. Cranial nerves grossly intact. Skin: No rashes or petechiae noted. Psych: Normal affect.   LAB RESULTS:  Lab Results  Component Value Date   NA 136 11/17/2021   K 4.4 11/17/2021   CL 94 (L) 11/17/2021   CO2 32 11/17/2021   GLUCOSE 127 (H) 11/17/2021   BUN 19 11/17/2021   CREATININE 0.80 05/21/2022   CALCIUM 9.5 11/17/2021   PROT 7.7 08/18/2021   ALBUMIN 3.2 (L) 08/18/2021   AST 31 08/18/2021   ALT 23 08/18/2021   ALKPHOS 96 08/18/2021   BILITOT 0.4 08/18/2021   GFRNONAA >60 11/17/2021   GFRAA >60 11/25/2019    Lab Results  Component Value Date   WBC 9.1 11/17/2021   NEUTROABS 9.0 (H) 08/18/2021   HGB 14.1 11/17/2021   HCT 44.7 11/17/2021   MCV 95.1 11/17/2021   PLT 293 11/17/2021     STUDIES: CT CHEST W CONTRAST  Result Date: 05/21/2022 CLINICAL DATA:  Restaging history of left lung cancer. Insert body Oncology EXAM: CT CHEST WITH CONTRAST TECHNIQUE: Multidetector CT imaging of the chest was performed during intravenous contrast administration. RADIATION DOSE REDUCTION: This exam was performed according to the departmental dose-optimization program which includes automated exposure control, adjustment of the mA and/or kV according to patient size and/or use of iterative reconstruction technique. CONTRAST:  55m OMNIPAQUE IOHEXOL 300 MG/ML  SOLN COMPARISON:  CTA chest 11/15/2021 FINDINGS: Cardiovascular: Normal heart size. Thoracic aortic and coronary arterial vascular calcifications. Moderate pericardial effusion, similar to prior. Mediastinum/Nodes: No enlarged axillary, mediastinal or hilar  lymphadenopathy. Normal appearance of the esophagus. Lungs/Pleura: Central airways are patent. Similar appearing masslike architectural distortion volume loss within the left upper  lobe most compatible with postradiation masslike fibrosis. Within the left upper lobe there is a 9 x 7 mm pulmonary nodule (image 25; series 3) slightly increased in size from most recent prior exam. Additionally there is a 4 mm left upper lobe pulmonary nodule (image 25; series 3), slightly increased from prior exam. Additional tiny scattered pulmonary nodules are similar to prior. Centrilobular emphysematous change. Similar loculated fluid along the left lung base. Upper Abdomen: No acute process. Stable thickening and nodularity of the adrenal glands. Musculoskeletal: Thoracic spine degenerative changes. No aggressive or acute appearing osseous lesions. IMPRESSION: 1. Slight interval increase in size of small pulmonary nodule within the left upper lobe, nonspecific in etiology. Continued attention on follow-up exams is recommended. 2. Stable masslike fibrosis within the lingula. 3. Small chest CT stable small to moderate pericardial effusion as well as loculated fluid within the left lower hemithorax. 4. Aortic Atherosclerosis (ICD10-I70.0) and Emphysema (ICD10-J43.9). Electronically Signed   By: Lovey Newcomer M.D.   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  PD-L1 85%.  PLAN:    1. Squamous cell carcinoma of the upper lobe of left lung: Patient completed concurrent chemotherapy with weekly carboplatinum and Taxol along with daily XRT on approximately September 24, 2019.  Her most recent CT scan on May 21, 2022 reviewed independently and reported as above revealing a small but persistent left upper lobe nodule with a slight interval increase in size.  Otherwise, there is no evidence of recurrent or metastatic disease.  No intervention is needed at this time.  Return to clinic in 6 months with repeat imaging and further evaluation.   2.  Shortness of breath: Chronic and unchanged.  Continue oxygen as prescribed.  Follow-up with pulmonary as  scheduled. 3.  Dizziness: Patient does not complain of this today.  Continue meclizine as needed.  Follow-up with primary care as indicated.  Patient expressed understanding and was in agreement with this plan. She also understands that She can call clinic at any time with any questions, concerns, or complaints.    Cancer Staging  Squamous cell carcinoma of left lung [facility]) Staging form: Lung, AJCC 8th Edition - Clinical stage from 07/26/2019: Stage IIB (cT3, cN0, cM0) - Signed by Lloyd Huger, MD on 07/26/2019   Lloyd Huger, MD   05/24/2022 11:37 AM

## 2022-05-21 ENCOUNTER — Ambulatory Visit
Admission: RE | Admit: 2022-05-21 | Discharge: 2022-05-21 | Disposition: A | Discharge status: Home / Self Care | Payer: Medicare HMO | Source: Ambulatory Visit | Attending: Oncology | Admitting: Oncology

## 2022-05-21 DIAGNOSIS — C3492 Malignant neoplasm of unspecified part of left bronchus or lung: Secondary | ICD-10-CM | POA: Insufficient documentation

## 2022-05-21 DIAGNOSIS — J439 Emphysema, unspecified: Secondary | ICD-10-CM | POA: Diagnosis not present

## 2022-05-21 LAB — POCT I-STAT CREATININE: Creatinine, Ser: 0.8 mg/dL (ref 0.44–1.00)

## 2022-05-21 MED ORDER — IOHEXOL 300 MG/ML  SOLN
75.0000 mL | Freq: Once | INTRAMUSCULAR | Status: AC | PRN
  Administered 2022-05-21: 75 mL via INTRAVENOUS

## 2022-05-24 ENCOUNTER — Inpatient Hospital Stay: Payer: Medicare HMO | Attending: Oncology | Admitting: Oncology

## 2022-05-24 ENCOUNTER — Encounter: Payer: Self-pay | Admitting: Oncology

## 2022-05-24 VITALS — BP 132/65 | HR 89 | Temp 98.7°F | Resp 18 | Wt 191.4 lb

## 2022-05-24 DIAGNOSIS — R42 Dizziness and giddiness: Secondary | ICD-10-CM | POA: Insufficient documentation

## 2022-05-24 DIAGNOSIS — Z9981 Dependence on supplemental oxygen: Secondary | ICD-10-CM | POA: Insufficient documentation

## 2022-05-24 DIAGNOSIS — Z923 Personal history of irradiation: Secondary | ICD-10-CM | POA: Insufficient documentation

## 2022-05-24 DIAGNOSIS — C3492 Malignant neoplasm of unspecified part of left bronchus or lung: Secondary | ICD-10-CM

## 2022-05-24 DIAGNOSIS — R0602 Shortness of breath: Secondary | ICD-10-CM | POA: Insufficient documentation

## 2022-05-24 DIAGNOSIS — F17210 Nicotine dependence, cigarettes, uncomplicated: Secondary | ICD-10-CM | POA: Diagnosis not present

## 2022-05-24 DIAGNOSIS — C3412 Malignant neoplasm of upper lobe, left bronchus or lung: Secondary | ICD-10-CM | POA: Insufficient documentation

## 2022-05-24 DIAGNOSIS — Z79899 Other long term (current) drug therapy: Secondary | ICD-10-CM | POA: Diagnosis not present

## 2022-05-24 DIAGNOSIS — Z9221 Personal history of antineoplastic chemotherapy: Secondary | ICD-10-CM | POA: Insufficient documentation

## 2022-06-06 DIAGNOSIS — I2510 Atherosclerotic heart disease of native coronary artery without angina pectoris: Secondary | ICD-10-CM | POA: Diagnosis not present

## 2022-06-06 DIAGNOSIS — Z8679 Personal history of other diseases of the circulatory system: Secondary | ICD-10-CM | POA: Diagnosis not present

## 2022-06-06 DIAGNOSIS — E119 Type 2 diabetes mellitus without complications: Secondary | ICD-10-CM | POA: Diagnosis not present

## 2022-06-06 DIAGNOSIS — E782 Mixed hyperlipidemia: Secondary | ICD-10-CM | POA: Diagnosis not present

## 2022-06-06 DIAGNOSIS — I10 Essential (primary) hypertension: Secondary | ICD-10-CM | POA: Diagnosis not present

## 2022-06-06 DIAGNOSIS — I700 Atherosclerosis of aorta: Secondary | ICD-10-CM | POA: Diagnosis not present

## 2022-06-06 DIAGNOSIS — E1165 Type 2 diabetes mellitus with hyperglycemia: Secondary | ICD-10-CM | POA: Diagnosis not present

## 2022-07-04 ENCOUNTER — Encounter: Payer: Self-pay | Admitting: Internal Medicine

## 2022-07-04 ENCOUNTER — Emergency Department: Payer: Medicare HMO

## 2022-07-04 ENCOUNTER — Inpatient Hospital Stay
Admission: EM | Admit: 2022-07-04 | Discharge: 2022-07-06 | DRG: 190 | Disposition: A | Discharge status: Home / Self Care | Payer: Medicare HMO | Attending: Internal Medicine | Admitting: Internal Medicine

## 2022-07-04 ENCOUNTER — Other Ambulatory Visit: Payer: Self-pay

## 2022-07-04 ENCOUNTER — Inpatient Hospital Stay: Payer: Medicare HMO

## 2022-07-04 DIAGNOSIS — E785 Hyperlipidemia, unspecified: Secondary | ICD-10-CM | POA: Diagnosis present

## 2022-07-04 DIAGNOSIS — R002 Palpitations: Secondary | ICD-10-CM | POA: Diagnosis not present

## 2022-07-04 DIAGNOSIS — J439 Emphysema, unspecified: Secondary | ICD-10-CM | POA: Diagnosis not present

## 2022-07-04 DIAGNOSIS — Z6833 Body mass index (BMI) 33.0-33.9, adult: Secondary | ICD-10-CM

## 2022-07-04 DIAGNOSIS — Z8249 Family history of ischemic heart disease and other diseases of the circulatory system: Secondary | ICD-10-CM

## 2022-07-04 DIAGNOSIS — Z83438 Family history of other disorder of lipoprotein metabolism and other lipidemia: Secondary | ICD-10-CM | POA: Diagnosis not present

## 2022-07-04 DIAGNOSIS — G2581 Restless legs syndrome: Secondary | ICD-10-CM | POA: Diagnosis not present

## 2022-07-04 DIAGNOSIS — J9621 Acute and chronic respiratory failure with hypoxia: Secondary | ICD-10-CM | POA: Diagnosis present

## 2022-07-04 DIAGNOSIS — J984 Other disorders of lung: Secondary | ICD-10-CM | POA: Diagnosis present

## 2022-07-04 DIAGNOSIS — J449 Chronic obstructive pulmonary disease, unspecified: Secondary | ICD-10-CM | POA: Diagnosis present

## 2022-07-04 DIAGNOSIS — I10 Essential (primary) hypertension: Secondary | ICD-10-CM | POA: Diagnosis present

## 2022-07-04 DIAGNOSIS — F3342 Major depressive disorder, recurrent, in full remission: Secondary | ICD-10-CM | POA: Diagnosis not present

## 2022-07-04 DIAGNOSIS — Z833 Family history of diabetes mellitus: Secondary | ICD-10-CM | POA: Diagnosis not present

## 2022-07-04 DIAGNOSIS — Z7984 Long term (current) use of oral hypoglycemic drugs: Secondary | ICD-10-CM | POA: Diagnosis not present

## 2022-07-04 DIAGNOSIS — F411 Generalized anxiety disorder: Secondary | ICD-10-CM | POA: Diagnosis present

## 2022-07-04 DIAGNOSIS — K219 Gastro-esophageal reflux disease without esophagitis: Secondary | ICD-10-CM | POA: Diagnosis present

## 2022-07-04 DIAGNOSIS — E669 Obesity, unspecified: Secondary | ICD-10-CM | POA: Diagnosis not present

## 2022-07-04 DIAGNOSIS — R079 Chest pain, unspecified: Secondary | ICD-10-CM | POA: Diagnosis not present

## 2022-07-04 DIAGNOSIS — E1169 Type 2 diabetes mellitus with other specified complication: Secondary | ICD-10-CM

## 2022-07-04 DIAGNOSIS — Z79899 Other long term (current) drug therapy: Secondary | ICD-10-CM

## 2022-07-04 DIAGNOSIS — C3492 Malignant neoplasm of unspecified part of left bronchus or lung: Secondary | ICD-10-CM | POA: Diagnosis not present

## 2022-07-04 DIAGNOSIS — F17210 Nicotine dependence, cigarettes, uncomplicated: Secondary | ICD-10-CM | POA: Diagnosis present

## 2022-07-04 DIAGNOSIS — E1165 Type 2 diabetes mellitus with hyperglycemia: Secondary | ICD-10-CM | POA: Diagnosis not present

## 2022-07-04 DIAGNOSIS — T380X5A Adverse effect of glucocorticoids and synthetic analogues, initial encounter: Secondary | ICD-10-CM | POA: Diagnosis present

## 2022-07-04 DIAGNOSIS — R0602 Shortness of breath: Secondary | ICD-10-CM | POA: Diagnosis not present

## 2022-07-04 DIAGNOSIS — Z20822 Contact with and (suspected) exposure to covid-19: Secondary | ICD-10-CM | POA: Diagnosis present

## 2022-07-04 DIAGNOSIS — I2510 Atherosclerotic heart disease of native coronary artery without angina pectoris: Secondary | ICD-10-CM | POA: Diagnosis present

## 2022-07-04 DIAGNOSIS — Z818 Family history of other mental and behavioral disorders: Secondary | ICD-10-CM | POA: Diagnosis not present

## 2022-07-04 DIAGNOSIS — J441 Chronic obstructive pulmonary disease with (acute) exacerbation: Secondary | ICD-10-CM | POA: Diagnosis not present

## 2022-07-04 DIAGNOSIS — E1141 Type 2 diabetes mellitus with diabetic mononeuropathy: Secondary | ICD-10-CM | POA: Diagnosis not present

## 2022-07-04 DIAGNOSIS — Z885 Allergy status to narcotic agent status: Secondary | ICD-10-CM

## 2022-07-04 DIAGNOSIS — Z886 Allergy status to analgesic agent status: Secondary | ICD-10-CM

## 2022-07-04 DIAGNOSIS — Z720 Tobacco use: Secondary | ICD-10-CM | POA: Diagnosis present

## 2022-07-04 DIAGNOSIS — Z888 Allergy status to other drugs, medicaments and biological substances status: Secondary | ICD-10-CM

## 2022-07-04 DIAGNOSIS — Z825 Family history of asthma and other chronic lower respiratory diseases: Secondary | ICD-10-CM

## 2022-07-04 DIAGNOSIS — Z803 Family history of malignant neoplasm of breast: Secondary | ICD-10-CM | POA: Diagnosis not present

## 2022-07-04 DIAGNOSIS — Z7982 Long term (current) use of aspirin: Secondary | ICD-10-CM

## 2022-07-04 DIAGNOSIS — G4700 Insomnia, unspecified: Secondary | ICD-10-CM | POA: Diagnosis present

## 2022-07-04 DIAGNOSIS — E119 Type 2 diabetes mellitus without complications: Secondary | ICD-10-CM

## 2022-07-04 LAB — COMPREHENSIVE METABOLIC PANEL
ALT: <5 U/L (ref 0–44)
AST: 14 U/L — ABNORMAL LOW (ref 15–41)
Albumin: 4 g/dL (ref 3.5–5.0)
Alkaline Phosphatase: 75 U/L (ref 38–126)
Anion gap: 6 (ref 5–15)
BUN: 16 mg/dL (ref 8–23)
CO2: 37 mmol/L — ABNORMAL HIGH (ref 22–32)
Calcium: 9 mg/dL (ref 8.9–10.3)
Chloride: 95 mmol/L — ABNORMAL LOW (ref 98–111)
Creatinine, Ser: 0.63 mg/dL (ref 0.44–1.00)
GFR, Estimated: >60 mL/min (ref >60)
Glucose, Bld: 100 mg/dL — ABNORMAL HIGH (ref 70–99)
Potassium: 4.5 mmol/L (ref 3.5–5.1)
Sodium: 138 mmol/L (ref 135–145)
Total Bilirubin: 0.5 mg/dL (ref 0.3–1.2)
Total Protein: 7.2 g/dL (ref 6.5–8.1)

## 2022-07-04 LAB — MRSA NEXT GEN BY PCR, NASAL: MRSA by PCR Next Gen: NOT DETECTED

## 2022-07-04 LAB — CBC
HCT: 41.5 % (ref 36.0–46.0)
Hemoglobin: 12.7 g/dL (ref 12.0–15.0)
MCH: 30.7 pg (ref 26.0–34.0)
MCHC: 30.6 g/dL (ref 30.0–36.0)
MCV: 100.2 fL — ABNORMAL HIGH (ref 80.0–100.0)
Platelets: 247 10*3/uL (ref 150–400)
RBC: 4.14 MIL/uL (ref 3.87–5.11)
RDW: 14.2 % (ref 11.5–15.5)
WBC: 9.4 10*3/uL (ref 4.0–10.5)
nRBC: 0 % (ref 0.0–0.2)

## 2022-07-04 LAB — BLOOD GAS, VENOUS
Acid-Base Excess: 10.3 mmol/L — ABNORMAL HIGH (ref 0.0–2.0)
Bicarbonate: 39.7 mmol/L — ABNORMAL HIGH (ref 20.0–28.0)
O2 Saturation: 97.9 %
Patient temperature: 37
pCO2, Ven: 77 mmHg (ref 44–60)
pH, Ven: 7.32 (ref 7.25–7.43)
pO2, Ven: 85 mmHg — ABNORMAL HIGH (ref 32–45)

## 2022-07-04 LAB — GLUCOSE, CAPILLARY: Glucose-Capillary: 221 mg/dL — ABNORMAL HIGH (ref 70–99)

## 2022-07-04 LAB — TROPONIN I (HIGH SENSITIVITY)
Troponin I (High Sensitivity): 11 ng/L (ref <18)
Troponin I (High Sensitivity): 11 ng/L (ref <18)
Troponin I (High Sensitivity): 9 ng/L (ref <18)

## 2022-07-04 LAB — BRAIN NATRIURETIC PEPTIDE: B Natriuretic Peptide: 315.3 pg/mL — ABNORMAL HIGH (ref 0.0–100.0)

## 2022-07-04 LAB — SARS CORONAVIRUS 2 BY RT PCR: SARS Coronavirus 2 by RT PCR: NEGATIVE

## 2022-07-04 LAB — PROCALCITONIN: Procalcitonin: <0.1 ng/mL

## 2022-07-04 MED ORDER — ONDANSETRON HCL 4 MG/2ML IJ SOLN: 4.0000 mg | Freq: Four times a day (QID) | INTRAMUSCULAR | Status: DC | PRN

## 2022-07-04 MED ORDER — ACETAMINOPHEN 650 MG RE SUPP: 650.0000 mg | Freq: Four times a day (QID) | RECTAL | Status: DC | PRN

## 2022-07-04 MED ORDER — FERROUS SULFATE 325 (65 FE) MG PO TABS
325.0000 mg | ORAL_TABLET | Freq: Two times a day (BID) | ORAL | Status: DC
  Administered 2022-07-05 – 2022-07-06 (×3): 325 mg via ORAL
  Filled 2022-07-04 (×2): qty 1

## 2022-07-04 MED ORDER — IPRATROPIUM-ALBUTEROL 0.5-2.5 (3) MG/3ML IN SOLN
3.0000 mL | Freq: Once | RESPIRATORY_TRACT | Status: AC
  Administered 2022-07-04: 3 mL via RESPIRATORY_TRACT
  Filled 2022-07-04: qty 3

## 2022-07-04 MED ORDER — CHLORHEXIDINE GLUCONATE CLOTH 2 % EX PADS
6.0000 | MEDICATED_PAD | Freq: Every day | CUTANEOUS | Status: DC
  Administered 2022-07-04: 6 via TOPICAL

## 2022-07-04 MED ORDER — CARBIDOPA-LEVODOPA 25-100 MG PO TABS
1.0000 | ORAL_TABLET | Freq: Three times a day (TID) | ORAL | Status: DC
  Filled 2022-07-04: qty 1

## 2022-07-04 MED ORDER — AMLODIPINE BESYLATE 10 MG PO TABS
10.0000 mg | ORAL_TABLET | Freq: Every day | ORAL | Status: DC
  Administered 2022-07-05 – 2022-07-06 (×2): 10 mg via ORAL
  Filled 2022-07-04 (×2): qty 1

## 2022-07-04 MED ORDER — FLUTICASONE FUROATE-VILANTEROL 100-25 MCG/ACT IN AEPB
1.0000 | INHALATION_SPRAY | Freq: Every day | RESPIRATORY_TRACT | Status: DC
  Administered 2022-07-05: 1 via RESPIRATORY_TRACT
  Filled 2022-07-04: qty 28

## 2022-07-04 MED ORDER — HYDRALAZINE HCL 10 MG PO TABS: 10.0000 mg | ORAL_TABLET | Freq: Three times a day (TID) | ORAL | Status: DC | PRN

## 2022-07-04 MED ORDER — ACETAMINOPHEN 500 MG PO TABS: 1000.0000 mg | ORAL_TABLET | Freq: Four times a day (QID) | ORAL | Status: DC | PRN

## 2022-07-04 MED ORDER — IPRATROPIUM-ALBUTEROL 0.5-2.5 (3) MG/3ML IN SOLN
3.0000 mL | Freq: Three times a day (TID) | RESPIRATORY_TRACT | Status: AC
  Administered 2022-07-05 (×3): 3 mL via RESPIRATORY_TRACT
  Filled 2022-07-04 (×4): qty 3

## 2022-07-04 MED ORDER — ISOSORBIDE MONONITRATE ER 30 MG PO TB24
30.0000 mg | ORAL_TABLET | Freq: Every day | ORAL | Status: DC
  Administered 2022-07-05 – 2022-07-06 (×2): 30 mg via ORAL
  Filled 2022-07-04 (×2): qty 1

## 2022-07-04 MED ORDER — IOHEXOL 350 MG/ML SOLN
100.0000 mL | Freq: Once | INTRAVENOUS | Status: AC | PRN
  Administered 2022-07-04: 100 mL via INTRAVENOUS

## 2022-07-04 MED ORDER — INSULIN ASPART 100 UNIT/ML IJ SOLN
0.0000 [IU] | Freq: Three times a day (TID) | INTRAMUSCULAR | Status: DC
  Administered 2022-07-05: 2 [IU] via SUBCUTANEOUS
  Administered 2022-07-05 (×2): 5 [IU] via SUBCUTANEOUS
  Administered 2022-07-06: 2 [IU] via SUBCUTANEOUS
  Filled 2022-07-04 (×4): qty 1

## 2022-07-04 MED ORDER — NICOTINE 21 MG/24HR TD PT24: 21.0000 mg | MEDICATED_PATCH | Freq: Every day | TRANSDERMAL | Status: DC | PRN

## 2022-07-04 MED ORDER — SENNOSIDES-DOCUSATE SODIUM 8.6-50 MG PO TABS: 1.0000 | ORAL_TABLET | Freq: Every evening | ORAL | Status: DC | PRN

## 2022-07-04 MED ORDER — PRAVASTATIN SODIUM 40 MG PO TABS
80.0000 mg | ORAL_TABLET | Freq: Every evening | ORAL | Status: DC
  Administered 2022-07-05: 80 mg via ORAL
  Filled 2022-07-04 (×2): qty 2

## 2022-07-04 MED ORDER — VENLAFAXINE HCL ER 75 MG PO CP24
150.0000 mg | ORAL_CAPSULE | Freq: Every day | ORAL | Status: DC
  Administered 2022-07-05 – 2022-07-06 (×2): 150 mg via ORAL
  Filled 2022-07-04 (×2): qty 2

## 2022-07-04 MED ORDER — VENLAFAXINE HCL ER 75 MG PO CP24
75.0000 mg | ORAL_CAPSULE | Freq: Every day | ORAL | Status: DC
  Administered 2022-07-05 – 2022-07-06 (×2): 75 mg via ORAL
  Filled 2022-07-04 (×2): qty 1

## 2022-07-04 MED ORDER — METHYLPREDNISOLONE SODIUM SUCC 125 MG IJ SOLR
125.0000 mg | Freq: Once | INTRAMUSCULAR | Status: AC
  Administered 2022-07-04: 125 mg via INTRAVENOUS
  Filled 2022-07-04: qty 2

## 2022-07-04 MED ORDER — METHYLPREDNISOLONE SODIUM SUCC 40 MG IJ SOLR
40.0000 mg | Freq: Two times a day (BID) | INTRAMUSCULAR | Status: DC
  Administered 2022-07-05: 40 mg via INTRAVENOUS
  Filled 2022-07-04: qty 1

## 2022-07-04 MED ORDER — MAGNESIUM SULFATE IN D5W 1-5 GM/100ML-% IV SOLN
1.0000 g | Freq: Once | INTRAVENOUS | Status: AC
  Administered 2022-07-04: 1 g via INTRAVENOUS
  Filled 2022-07-04: qty 100

## 2022-07-04 MED ORDER — MIRTAZAPINE 15 MG PO TABS
7.5000 mg | ORAL_TABLET | Freq: Every day | ORAL | Status: DC
  Administered 2022-07-04 – 2022-07-05 (×2): 7.5 mg via ORAL
  Filled 2022-07-04 (×2): qty 1

## 2022-07-04 MED ORDER — GABAPENTIN 400 MG PO CAPS
400.0000 mg | ORAL_CAPSULE | Freq: Three times a day (TID) | ORAL | Status: DC
  Administered 2022-07-04 – 2022-07-06 (×5): 400 mg via ORAL
  Filled 2022-07-04 (×6): qty 1

## 2022-07-04 MED ORDER — ONDANSETRON HCL 4 MG PO TABS: 4.0000 mg | ORAL_TABLET | Freq: Four times a day (QID) | ORAL | Status: DC | PRN

## 2022-07-04 MED ORDER — IPRATROPIUM-ALBUTEROL 0.5-2.5 (3) MG/3ML IN SOLN
3.0000 mL | Freq: Once | RESPIRATORY_TRACT | Status: AC
  Administered 2022-07-04: 3 mL via RESPIRATORY_TRACT
  Filled 2022-07-04: qty 9

## 2022-07-04 MED ORDER — INSULIN ASPART 100 UNIT/ML IJ SOLN
0.0000 [IU] | Freq: Every day | INTRAMUSCULAR | Status: DC
  Administered 2022-07-04: 2 [IU] via SUBCUTANEOUS
  Filled 2022-07-04: qty 1

## 2022-07-04 MED ORDER — SPIRONOLACTONE 25 MG PO TABS
25.0000 mg | ORAL_TABLET | Freq: Every day | ORAL | Status: DC
  Administered 2022-07-05 – 2022-07-06 (×2): 25 mg via ORAL
  Filled 2022-07-04 (×2): qty 1

## 2022-07-04 MED ORDER — ENSURE ENLIVE PO LIQD
237.0000 mL | Freq: Two times a day (BID) | ORAL | Status: DC
  Administered 2022-07-05 – 2022-07-06 (×3): 237 mL via ORAL

## 2022-07-04 MED ORDER — METFORMIN HCL 500 MG PO TABS
500.0000 mg | ORAL_TABLET | Freq: Two times a day (BID) | ORAL | Status: DC
  Administered 2022-07-05 – 2022-07-06 (×3): 500 mg via ORAL
  Filled 2022-07-04 (×3): qty 1

## 2022-07-04 MED ORDER — LORAZEPAM 2 MG/ML IJ SOLN
1.0000 mg | Freq: Once | INTRAMUSCULAR | Status: AC
  Administered 2022-07-04: 1 mg via INTRAVENOUS

## 2022-07-04 MED ORDER — LORAZEPAM 2 MG/ML IJ SOLN
1.0000 mg | Freq: Four times a day (QID) | INTRAMUSCULAR | Status: DC | PRN
  Administered 2022-07-04: 1 mg via INTRAVENOUS
  Filled 2022-07-04 (×2): qty 1

## 2022-07-04 MED ORDER — UMECLIDINIUM BROMIDE 62.5 MCG/ACT IN AEPB
1.0000 | INHALATION_SPRAY | Freq: Every day | RESPIRATORY_TRACT | Status: DC
  Administered 2022-07-05: 1 via RESPIRATORY_TRACT
  Filled 2022-07-04: qty 7

## 2022-07-04 MED ORDER — MAGNESIUM SULFATE 2 GM/50ML IV SOLN
INTRAVENOUS | Status: AC
  Filled 2022-07-04: qty 50

## 2022-07-04 MED ORDER — ALBUTEROL SULFATE (2.5 MG/3ML) 0.083% IN NEBU
3.0000 mL | INHALATION_SOLUTION | RESPIRATORY_TRACT | Status: DC | PRN
  Administered 2022-07-04: 3 mL via RESPIRATORY_TRACT
  Filled 2022-07-04: qty 3

## 2022-07-04 MED ORDER — PANTOPRAZOLE SODIUM 40 MG PO TBEC
80.0000 mg | DELAYED_RELEASE_TABLET | Freq: Every day | ORAL | Status: DC
  Administered 2022-07-05 – 2022-07-06 (×2): 80 mg via ORAL
  Filled 2022-07-04 (×2): qty 2

## 2022-07-04 MED ORDER — ENOXAPARIN SODIUM 40 MG/0.4ML IJ SOSY
40.0000 mg | PREFILLED_SYRINGE | INTRAMUSCULAR | Status: DC
  Administered 2022-07-04 – 2022-07-05 (×2): 40 mg via SUBCUTANEOUS
  Filled 2022-07-04 (×2): qty 0.4

## 2022-07-04 MED ORDER — VITAMIN B-12 1000 MCG PO TABS
1000.0000 ug | ORAL_TABLET | Freq: Every day | ORAL | Status: DC
  Administered 2022-07-04 – 2022-07-06 (×3): 1000 ug via ORAL
  Filled 2022-07-04 (×4): qty 1

## 2022-07-04 MED ORDER — METOPROLOL TARTRATE 25 MG PO TABS
25.0000 mg | ORAL_TABLET | Freq: Two times a day (BID) | ORAL | Status: DC
  Administered 2022-07-04 – 2022-07-06 (×4): 25 mg via ORAL
  Filled 2022-07-04 (×4): qty 1

## 2022-07-04 MED ORDER — FLUTICASONE-UMECLIDIN-VILANT 100-62.5-25 MCG/INH IN AEPB: 1.0000 | INHALATION_SPRAY | Freq: Every day | RESPIRATORY_TRACT | Status: DC

## 2022-07-04 MED ORDER — CARBIDOPA-LEVODOPA 25-100 MG PO TABS
1.0000 | ORAL_TABLET | Freq: Three times a day (TID) | ORAL | Status: DC
  Administered 2022-07-04 – 2022-07-06 (×5): 1 via ORAL
  Filled 2022-07-04 (×7): qty 1

## 2022-07-04 MED ORDER — ASPIRIN 81 MG PO CHEW
81.0000 mg | CHEWABLE_TABLET | Freq: Every day | ORAL | Status: DC
  Administered 2022-07-05 – 2022-07-06 (×2): 81 mg via ORAL
  Filled 2022-07-04 (×3): qty 1

## 2022-07-04 NOTE — ED Notes (Signed)
Pt given hospital meal.

## 2022-07-04 NOTE — ED Notes (Signed)
Pt done with nebulizer treatment, Pt placed back on 3L via [place]. RN notified.

## 2022-07-04 NOTE — ED Provider Notes (Signed)
[facility] Provider Note    Event Date/Time   First MD Initiated Contact with Patient 07/04/22 1301     (approximate)   History   Shortness of Breath   HPI  Suzanne Glenn is a 71 y.o. female with past medical history of chronic hypoxia on 2 L nasal cannula, COPD, CHF, here with shortness of breath.  The patient states that for the last several days, she has had progressively worsening shortness of breath.  She is chronically short of breath at baseline, but states she has had slightly increased cough and more difficulty getting around her house.  Earlier today, she checked her oxygen at home and states it was as low as the 50s to 60s.  She felt short of breath then.  No chest pain.  She did feel lightheaded.  She is subsequent presents for evaluation.  She states she feels overall improved now.  She denies any leg swelling.  Has been taking her medications as prescribed.       Physical Exam   Triage Vital Signs: ED Triage Vitals  Enc Vitals Group     BP 07/04/22 1133 136/68     Pulse Rate 07/04/22 1133 85     Resp 07/04/22 1133 20     Temp 07/04/22 1133 98.2 F (36.8 C)     Temp Source 07/04/22 1133 Oral     SpO2 07/04/22 1133 94 %     Weight 07/04/22 1136 191 lb 5.8 oz (86.8 kg)     Height 07/04/22 1136 5\' 3"  (1.6 m)     Head Circumference --      Peak Flow --      Pain Score 07/04/22 1136 0     Pain Loc --      Pain Edu? --      Excl. in [place]? --     Most recent vital signs: Vitals:   07/04/22 1726 07/04/22 1911  BP:  (!) 129/56  Pulse:  (!) 113  Resp: (!) 24 (!) 30  Temp:    SpO2: 98%      General: Awake, no distress.  CV:  Good peripheral perfusion.  Regular rate and rhythm. Resp:  Tachypneic, diminished aeration bilaterally with diffuse wheezes, primarily expiratory. Abd:  No distention.  Other:  No significant lower extremity edema.   ED Results / Procedures / Treatments   Labs (all labs ordered are listed, but only  abnormal results are displayed) Labs Reviewed  CBC - Abnormal; Notable for the following components:      Result Value   MCV 100.2 (*)    All other components within normal limits  BRAIN NATRIURETIC PEPTIDE - Abnormal; Notable for the following components:   B Natriuretic Peptide 315.3 (*)    All other components within normal limits  COMPREHENSIVE METABOLIC PANEL - Abnormal; Notable for the following components:   Chloride 95 (*)    CO2 37 (*)    Glucose, Bld 100 (*)    AST 14 (*)    All other components within normal limits  SARS CORONAVIRUS 2 BY RT PCR  PROCALCITONIN  HIV ANTIBODY (ROUTINE TESTING W REFLEX)  HEMOGLOBIN A1C  MAGNESIUM  PHOSPHORUS  BLOOD GAS, VENOUS  TROPONIN I (HIGH SENSITIVITY)  TROPONIN I (HIGH SENSITIVITY)  TROPONIN I (HIGH SENSITIVITY)     EKG Normal sinus rhythm with fusion complexes, ventricular rate 84.  PR 148, QRS 120, QTc 451.  PVCs.  No acute ST elevations.  Nonspecific changes.  Intraventricular block noted.   RADIOLOGY Chest x-ray: Persistent scarring and volume loss in the lingula, persistent small pleural scarring on the left   I also independently reviewed and agree with radiologist interpretations.   PROCEDURES:  Critical Care performed: No   MEDICATIONS ORDERED IN ED: Medications  acetaminophen (TYLENOL) tablet 1,000 mg (has no administration in time range)    Or  acetaminophen (TYLENOL) suppository 650 mg (has no administration in time range)  ondansetron (ZOFRAN) tablet 4 mg (has no administration in time range)    Or  ondansetron (ZOFRAN) injection 4 mg (has no administration in time range)  enoxaparin (LOVENOX) injection 40 mg (has no administration in time range)  senna-docusate (Senokot-S) tablet 1 tablet (has no administration in time range)  ipratropium-albuterol (DUONEB) 0.5-2.5 (3) MG/3ML nebulizer solution 3 mL (has no administration in time range)  methylPREDNISolone sodium succinate (SOLU-MEDROL) 40 mg/mL  injection 40 mg (has no administration in time range)  LORazepam (ATIVAN) injection 1 mg (1 mg Intravenous Given 07/04/22 1835)  aspirin chewable tablet 81 mg (has no administration in time range)  amLODipine (NORVASC) tablet 10 mg (has no administration in time range)  isosorbide mononitrate (IMDUR) 24 hr tablet 30 mg (has no administration in time range)  metoprolol tartrate (LOPRESSOR) tablet 25 mg (has no administration in time range)  pravastatin (PRAVACHOL) tablet 80 mg (has no administration in time range)  spironolactone (ALDACTONE) tablet 25 mg (has no administration in time range)  mirtazapine (REMERON) tablet 7.5 mg (has no administration in time range)  nicotine (NICODERM CQ - dosed in mg/24 hours) patch 21 mg (has no administration in time range)  venlafaxine XR (EFFEXOR-XR) 24 hr capsule 150 mg (has no administration in time range)  venlafaxine XR (EFFEXOR-XR) 24 hr capsule 75 mg (has no administration in time range)  metFORMIN (GLUCOPHAGE) tablet 500 mg (has no administration in time range)  pantoprazole (PROTONIX) EC tablet 80 mg (has no administration in time range)  cyanocobalamin (VITAMIN B12) tablet 1,000 mcg (has no administration in time range)  ferrous sulfate tablet 325 mg (has no administration in time range)  gabapentin (NEURONTIN) capsule 400 mg (has no administration in time range)  feeding supplement (ENSURE ENLIVE / ENSURE PLUS) liquid 237 mL (has no administration in time range)  albuterol (PROVENTIL) (2.5 MG/3ML) 0.083% nebulizer solution 3 mL (3 mLs Inhalation Given 07/04/22 1857)  insulin aspart (novoLOG) injection 0-15 Units (has no administration in time range)  insulin aspart (novoLOG) injection 0-5 Units (has no administration in time range)  hydrALAZINE (APRESOLINE) tablet 10 mg (has no administration in time range)  fluticasone furoate-vilanterol (BREO ELLIPTA) 100-25 MCG/ACT 1 puff (has no administration in time range)    And  umeclidinium bromide  (INCRUSE ELLIPTA) 62.5 MCG/ACT 1 puff (has no administration in time range)  iohexol (OMNIPAQUE) 350 MG/ML injection 100 mL (has no administration in time range)  magnesium sulfate IVPB 1 g 100 mL (has no administration in time range)  carbidopa-levodopa (SINEMET IR) 25-100 MG per tablet immediate release 1 tablet (has no administration in time range)  methylPREDNISolone sodium succinate (SOLU-MEDROL) 125 mg/2 mL injection 125 mg (125 mg Intravenous Given 07/04/22 1350)  ipratropium-albuterol (DUONEB) 0.5-2.5 (3) MG/3ML nebulizer solution 3 mL (3 mLs Nebulization Given 07/04/22 1350)  ipratropium-albuterol (DUONEB) 0.5-2.5 (3) MG/3ML nebulizer solution 3 mL (3 mLs Nebulization Given 07/04/22 1350)  ipratropium-albuterol (DUONEB) 0.5-2.5 (3) MG/3ML nebulizer solution 3 mL (3 mLs Nebulization Given 07/04/22 1350)     IMPRESSION / MDM / ASSESSMENT AND PLAN /  ED COURSE  I reviewed the triage vital signs and the nursing notes.                               The patient is on the cardiac monitor to evaluate for evidence of arrhythmia and/or significant heart rate changes.   Ddx:  Differential includes the following, with pertinent life- or limb-threatening emergencies considered:  COPD exacerbation, PNA, CHF, ACS, symptomatic anemia, PE  Patient's presentation is most consistent with acute presentation with potential threat to life or bodily function.  MDM:  71 yo F with PMHx chronic hypoxia, COPD, CHF, valvular disease, here with SOB, hypoxia. Pt VS are stable, though she is requiring 3L [place] as opposed to 2L at baseline. Lungs show significant diminished aeration bilaterally. Moderate wheezing noted. Pt also with frequent PVCs though no significant arrhythmia on telemetry. CXR reviewed and shows no PNA. Labs show no leukocytosis or anemia, lytes wnl. BNP 315 but clinically pt does not appear to be in CHF. Suspect primarily COPD. IV Solumedrol, albuterol given with improvement but persistent hypoxia  above baseline and marked dyspnea and desats with any movement. Will admit to medicine.   MEDICATIONS GIVEN IN ED: Medications  acetaminophen (TYLENOL) tablet 1,000 mg (has no administration in time range)    Or  acetaminophen (TYLENOL) suppository 650 mg (has no administration in time range)  ondansetron (ZOFRAN) tablet 4 mg (has no administration in time range)    Or  ondansetron (ZOFRAN) injection 4 mg (has no administration in time range)  enoxaparin (LOVENOX) injection 40 mg (has no administration in time range)  senna-docusate (Senokot-S) tablet 1 tablet (has no administration in time range)  ipratropium-albuterol (DUONEB) 0.5-2.5 (3) MG/3ML nebulizer solution 3 mL (has no administration in time range)  methylPREDNISolone sodium succinate (SOLU-MEDROL) 40 mg/mL injection 40 mg (has no administration in time range)  LORazepam (ATIVAN) injection 1 mg (1 mg Intravenous Given 07/04/22 1835)  aspirin chewable tablet 81 mg (has no administration in time range)  amLODipine (NORVASC) tablet 10 mg (has no administration in time range)  isosorbide mononitrate (IMDUR) 24 hr tablet 30 mg (has no administration in time range)  metoprolol tartrate (LOPRESSOR) tablet 25 mg (has no administration in time range)  pravastatin (PRAVACHOL) tablet 80 mg (has no administration in time range)  spironolactone (ALDACTONE) tablet 25 mg (has no administration in time range)  mirtazapine (REMERON) tablet 7.5 mg (has no administration in time range)  nicotine (NICODERM CQ - dosed in mg/24 hours) patch 21 mg (has no administration in time range)  venlafaxine XR (EFFEXOR-XR) 24 hr capsule 150 mg (has no administration in time range)  venlafaxine XR (EFFEXOR-XR) 24 hr capsule 75 mg (has no administration in time range)  metFORMIN (GLUCOPHAGE) tablet 500 mg (has no administration in time range)  pantoprazole (PROTONIX) EC tablet 80 mg (has no administration in time range)  cyanocobalamin (VITAMIN B12) tablet 1,000  mcg (has no administration in time range)  ferrous sulfate tablet 325 mg (has no administration in time range)  gabapentin (NEURONTIN) capsule 400 mg (has no administration in time range)  feeding supplement (ENSURE ENLIVE / ENSURE PLUS) liquid 237 mL (has no administration in time range)  albuterol (PROVENTIL) (2.5 MG/3ML) 0.083% nebulizer solution 3 mL (3 mLs Inhalation Given 07/04/22 1857)  insulin aspart (novoLOG) injection 0-15 Units (has no administration in time range)  insulin aspart (novoLOG) injection 0-5 Units (has no administration in time range)  hydrALAZINE (  APRESOLINE) tablet 10 mg (has no administration in time range)  fluticasone furoate-vilanterol (BREO ELLIPTA) 100-25 MCG/ACT 1 puff (has no administration in time range)    And  umeclidinium bromide (INCRUSE ELLIPTA) 62.5 MCG/ACT 1 puff (has no administration in time range)  iohexol (OMNIPAQUE) 350 MG/ML injection 100 mL (has no administration in time range)  magnesium sulfate IVPB 1 g 100 mL (has no administration in time range)  carbidopa-levodopa (SINEMET IR) 25-100 MG per tablet immediate release 1 tablet (has no administration in time range)  methylPREDNISolone sodium succinate (SOLU-MEDROL) 125 mg/2 mL injection 125 mg (125 mg Intravenous Given 07/04/22 1350)  ipratropium-albuterol (DUONEB) 0.5-2.5 (3) MG/3ML nebulizer solution 3 mL (3 mLs Nebulization Given 07/04/22 1350)  ipratropium-albuterol (DUONEB) 0.5-2.5 (3) MG/3ML nebulizer solution 3 mL (3 mLs Nebulization Given 07/04/22 1350)  ipratropium-albuterol (DUONEB) 0.5-2.5 (3) MG/3ML nebulizer solution 3 mL (3 mLs Nebulization Given 07/04/22 1350)     Consults:  Hospitalist consulted for admission   EMR reviewed Dr. Grayland Ormond Oncology notes reviewed, prior notes from Dr. Nehemiah Massed    FINAL CLINICAL IMPRESSION(S) / ED DIAGNOSES   Final diagnoses:  Acute on chronic respiratory failure with hypoxia ([place])     Rx / DC Orders   ED Discharge Orders     None         Note:  This document was prepared using Dragon voice recognition software and may include unintentional dictation errors.   Duffy Bruce, MD 07/04/22 1924

## 2022-07-04 NOTE — Assessment & Plan Note (Signed)
-   Continue outpatient follow-up with oncology

## 2022-07-04 NOTE — Hospital Course (Signed)
[name] is a 71 year old female with history of hypertension, GERD, neuropathy, COPD, continued tobacco use, hyperlipidemia, depression, anxiety, history of squamous cell carcinoma of the left lung, oxygen supplementation dependence, generalized anxiety disorder, insomnia, restless leg syndrome, who presents emergency department for chief concerns of shortness of breath and palpitations for 4 days.  Initial vitals in the ED showed temperature of 98.2, respiration rate of 20, heart rate of 85, blood pressure 136/68, SPO2 of 94% on 2 L nasal cannula.  Serum sodium is 138, potassium 4.5, chloride of 95, bicarb 37, BUN of 16, serum creatinine 0.63, GFR greater than 60, nonfasting blood glucose is 100, WBC 9.4, hemoglobin 12.7, platelets of 243.  BNP was elevated at 315.3.  High sensitive troponin was 11 and and on repeat was 11.  Chest x-ray 2 view was read as persistent scarring and volume loss in the lingula.  Persistent small amount of subpulmonic fluid or pleural scarring on the left.  ED treatment DuoNebs x3, Solu-Medrol 125 mg IV.

## 2022-07-04 NOTE — Assessment & Plan Note (Signed)
-   As needed nicotine patch ordered ?

## 2022-07-04 NOTE — Assessment & Plan Note (Addendum)
-   Amlodipine 10 mg daily, isosorbide 30 mg daily, metoprolol tartrate 25 mg p.o. twice daily, spironolactone 25 mg daily resumed - Hydralazine 10 mg p.o. every 8 hours as needed for SBP greater than 180, 4 days ordered

## 2022-07-04 NOTE — ED Triage Notes (Signed)
Pt here with worsening SOB and palpitations for the past 4 days. Pt wears 2L BNC at home. Pt denies pain. Pt thinks she may be having an anxiety attack but does not take anxiety medication only anti depressants. Pt denies N/V/D.

## 2022-07-04 NOTE — Assessment & Plan Note (Addendum)
-   Resumed home venlafaxine 225 mg p.o. daily - Mirtazapine 7.5 mg nightly resumed - Patient denies SI and HI

## 2022-07-04 NOTE — Assessment & Plan Note (Signed)
-   At baseline patient wears 2 L nasal cannula with instructions to increase O2 supplementation to 3 L nasal cannula with movement and exertion - At bedside patient is on 4 L of nasal cannula and using accessory muscles

## 2022-07-04 NOTE — Assessment & Plan Note (Signed)
-   At bedside patient endorses feeling anxious regarding her COPD exacerbation again - Resumed home venlafaxine 225 mg daily - Lorazepam 1 mg IV every 6 hours as needed for anxiety, 3 doses ordered

## 2022-07-04 NOTE — Assessment & Plan Note (Deleted)
-   Resumed long-acting inhaler and or its equivalent on formulary - DuoNebs scheduled 4 times daily, 4 doses ordered on admission - Albuterol, 2 puffs inhalation every 4 hours as needed for wheezing and shortness of breath resumed - Status post Solu-Medrol 125 mg IV per EDP - I ordered Solu-Medrol 40 mg IV every 12 hours, 2 doses scheduled for 07/05/2022

## 2022-07-04 NOTE — Assessment & Plan Note (Addendum)
-   Metformin 500 mg IV twice daily resumed - Insulin SSI with at bedtime coverage ordered; goal inpatient blood glucose levels 140-180

## 2022-07-04 NOTE — Assessment & Plan Note (Signed)
-   Resumed long-acting inhaler and or its equivalent on formulary - DuoNebs scheduled 4 times daily, 4 doses ordered on admission - Albuterol, 2 puffs inhalation every 4 hours as needed for wheezing and shortness of breath resumed - Status post Solu-Medrol 125 mg IV per EDP - I ordered Solu-Medrol 40 mg IV every 12 hours, 2 doses scheduled for 07/05/2022

## 2022-07-04 NOTE — Progress Notes (Addendum)
[facility] Progress Note/Significant Event  I received message from RN reporting that patient has sudden onset of SPO2 decreasing to 70s on 8 L nasal cannula.  I presented to bedside.  Patient appears to be mildly uncomfortable, with increased accessory muscle use for respiration, nonrebreather mask in place.  Her lower extremities continue to move restlessly.  Vitals: HR 106, RR 22, spO100 % on NRB at 15 L, blood pressure 135/95  HENT: Pupils equal and reactive to light, trachea is midline, head is nontraumatic Respiratory: Diffuse generalized and expiratory wheezing, increased accessory muscle use Cardiac: Tachycardia, cardia, regular rhythm, no JVD, lateral lower extremity negative for swelling Abdomen: Obese, nontender, nondistended, soft  # Acute hypoxic respiratory failure-etiology work-up in progress - Complicated by COPD exacerbation and continued smoking tobacco use and history of squamous cell lung cancer of the left lung - Stat VBG, BiPAP, high sensitive troponin, CTA to assess for PE ordered, discussed with RN - Magnesium 1 g IV one-time dose ordered - Discussed with cross coverage provider for follow-up of CTA - Change bed to stepdown, inpatient  # Restless leg syndrome -known diagnosis and patient states the feelings in her legs are unchanged from prior episodes of restless leg syndrome - Patient takes gabapentin and carbidopa-levodopa - I change Sinemet order to include now instead of the next dosing at 2200 - Gabapentin 400 mg 3 times daily has been resumed as appropriate  Dr. Tobie Poet

## 2022-07-04 NOTE — H&P (Addendum)
History and Physical   Suzanne Glenn:[id number] DOB: May 31, 1951 DOA: 07/04/2022  PCP: Tracie Harrier, MD  Outpatient Specialists: Dr. Raul Del, pulmonology Patient coming from: Home  I have personally briefly reviewed patient's old medical records in [place].  Chief Concern: Shortness of breath  HPI: [name] is a 71 year old female with history of hypertension, GERD, neuropathy, COPD, continued tobacco use, hyperlipidemia, depression, anxiety, history of squamous cell carcinoma of the left lung, oxygen supplementation dependence, generalized anxiety disorder, insomnia, restless leg syndrome, who presents emergency department for chief concerns of shortness of breath and palpitations for 4 days.  Initial vitals in the ED showed temperature of 98.2, respiration rate of 20, heart rate of 85, blood pressure 136/68, SPO2 of 94% on 2 L nasal cannula.  Serum sodium is 138, potassium 4.5, chloride of 95, bicarb 37, BUN of 16, serum creatinine 0.63, GFR greater than 60, nonfasting blood glucose is 100, WBC 9.4, hemoglobin 12.7, platelets of 243.  BNP was elevated at 315.3.  High sensitive troponin was 11 and and on repeat was 11.  Chest x-ray 2 view was read as persistent scarring and volume loss in the lingula.  Persistent small amount of subpulmonic fluid or pleural scarring on the left.  ED treatment DuoNebs x3, Solu-Medrol 125 mg IV.  At bedside patient was able to tell me her name, her age, she knows her husband is at bedside and she knows the current calendar year.  She does not appear to be in acute distress.  She does appear to be mildly uncomfortable and wheezing.  She reports she has been short of breath with increased palpitation and anxiety for the past 4 days.  She denies known sick contacts.  She reports no changes to productive sputum.  She states the sputum has been white.  She denies fever, nausea, vomiting, chest pain, abdominal pain.  She endorses  chills.  Social history: She is at home with her husband.  She currently smokes 1 pack/day however could not smoke in the last 3 to 4 days due to somewhat short of breath.  She denies EtOH and recreational drug use.  She is not ready to quit.  ROS: Constitutional: no weight change, no fever ENT/Mouth: no sore throat, no rhinorrhea Eyes: no eye pain, no vision changes Cardiovascular: no chest pain, + dyspnea,  no edema, + palpitations Respiratory: + cough, + sputum, + wheezing Gastrointestinal: no nausea, no vomiting, no diarrhea, no constipation Genitourinary: no urinary incontinence, no dysuria, no hematuria Musculoskeletal: no arthralgias, no myalgias Skin: no skin lesions, no pruritus, Neuro: + weakness, no loss of consciousness, no syncope Psych: no anxiety, no depression, + decrease appetite Heme/Lymph: no bruising, no bleeding  ED Course: Discussed with emergency department provider, patient requiring hospitalization for chief concerns of COPD exacerbation.  Assessment/Plan  Principal Problem:   COPD exacerbation (HCC) Active Problems:   Coronary artery disease   Diabetes mellitus, type 2 (HCC)   GAD (generalized anxiety disorder)   Essential (primary) hypertension   Type 2 diabetes mellitus (HCC)   Controlled type 2 diabetes mellitus without complication (HCC)   Moderate COPD (chronic obstructive pulmonary disease) (HCC)   Tobacco use   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HCC)   MDD (major depressive disorder), recurrent, in full remission (HCC)   HLD (hyperlipidemia)   RLS (restless legs syndrome)   Assessment and Plan:  * COPD exacerbation (HCC) - Resumed long-acting inhaler and or its equivalent on formulary - DuoNebs scheduled 4  times daily, 4 doses ordered on admission - Albuterol, 2 puffs inhalation every 4 hours as needed for wheezing and shortness of breath resumed - Status post Solu-Medrol 125 mg IV per EDP - I ordered Solu-Medrol 40 mg IV every 12  hours, 2 doses scheduled for 07/05/2022  MDD (major depressive disorder), recurrent, in full remission ([place]) - Resumed home venlafaxine 225 mg p.o. daily - Mirtazapine 7.5 mg nightly resumed - Patient denies SI and HI  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[place]) - Continue outpatient follow-up with oncology  Tobacco use - As needed nicotine patch ordered  Moderate COPD (chronic obstructive pulmonary disease) ([place]) - At baseline patient wears 2 L nasal cannula with instructions to increase O2 supplementation to 3 L nasal cannula with movement and exertion - At bedside patient is on 4 L of nasal cannula and using accessory muscles  Type 2 diabetes mellitus (HCC) - Metformin 500 mg IV twice daily resumed - Insulin SSI with at bedtime coverage ordered; goal inpatient blood glucose levels 140-180  Essential (primary) hypertension - Amlodipine 10 mg daily, isosorbide 30 mg daily, metoprolol tartrate 25 mg p.o. twice daily, spironolactone 25 mg daily resumed - Hydralazine 10 mg p.o. every 8 hours as needed for SBP greater than 180, 4 days ordered  GAD (generalized anxiety disorder) - At bedside patient endorses feeling anxious regarding her COPD exacerbation again - Resumed home venlafaxine 225 mg daily - Lorazepam 1 mg IV every 6 hours as needed for anxiety, 3 doses ordered  Chart reviewed.   DVT prophylaxis: Enoxaparin subcutaneous every 24 hours Code Status: Full code Diet: Heart healthy Family Communication: Updated husband, Bill at bedside with patient's permission Disposition Plan: Pending clinical course Consults called: None at this time Admission status: Telemetry medical, observation  Past Medical History:  Diagnosis Date   Anxiety    Asthma    Cancer ([place]) 12/2018   w/u for right upper lobe mass/cancer   COPD (chronic obstructive pulmonary disease) ([place])    also, emphysema. now using o2 via np 24 hours a day   Coronary artery disease    Depression    Diabetes  mellitus, type II (HCC)    GERD (gastroesophageal reflux disease)    Headache    migraines in early 20's   Heart murmur    HLD (hyperlipidemia)    Hypertension    Lung cancer ([place])    Neuropathy    Restless leg    Past Surgical History:  Procedure Laterality Date   BACK SURGERY  2005   surgery x 2, disc fused in neck, pinched nerve in center of back and neck   CARDIAC CATHETERIZATION  2015   1 stent placed for blockage   cervical bone infusion     CHOLECYSTECTOMY     DILATION AND CURETTAGE OF UTERUS     FOOT SURGERY Left    foot bone spur removed   THORACIC DISC SURGERY     TUBAL LIGATION     VIDEO BRONCHOSCOPY WITH ENDOBRONCHIAL ULTRASOUND N/A 03/06/2019   Procedure: VIDEO BRONCHOSCOPY WITH ENDOBRONCHIAL ULTRASOUND - DIABETIC;  Surgeon: Ottie Glazier, MD;  Location: [facility] ORS;  Service: Thoracic;  Laterality: N/A;   Social History:  reports that she has been smoking cigarettes. She started smoking about 47 years ago. She has a 40.00 pack-year smoking history. She has never used smokeless tobacco. She reports that she does not drink alcohol and does not use drugs.  Allergies  Allergen Reactions   Diclofenac-Misoprostol Other (See Comments)  Arthrotec - gastritis    Nsaids Other (See Comments)    gastritis   Zolpidem Other (See Comments)    Sleep walking    Hydrocodone-Acetaminophen Nausea And Vomiting   Simvastatin Other (See Comments)    body aches   Family History  Problem Relation Age of Onset   Anxiety disorder Mother    Cancer - Lung Mother    Depression Sister    Cancer Sister    Cancer Sister    Cancer Sister    Cancer Sister    Heart Problems Sister    Bipolar disorder Sister    Diabetes Sister    Cancer - Lung Sister    Hypertension Sister    Diabetes Sister    Hyperlipidemia Sister    COPD Sister    Depression Brother    Heart Problems Brother    Cancer Brother    Cancer Brother    Cancer Brother    Breast cancer Maternal Aunt    Depression  Son    Family history: Family history reviewed and not pertinent.  Prior to Admission medications   Medication Sig Start Date End Date Taking? Authorizing Provider  ACCU-CHEK FASTCLIX LANCETS [place]  02/18/15   [provider]  ACCU-CHEK SMARTVIEW test strip  02/18/15   [provider]  albuterol (VENTOLIN HFA) 108 (90 Base) MCG/ACT inhaler Inhale 2 puffs into the lungs every 4 (four) hours as needed for wheezing or shortness of breath.  03/22/14   [provider]  amLODipine (NORVASC) 10 MG tablet Take 10 mg by mouth daily.     [provider]  aspirin 81 MG chewable tablet Chew by mouth daily.    [provider]  carbidopa-levodopa (SINEMET IR) 25-100 MG tablet Take 1 tablet by mouth 3 (three) times daily.    [provider]  cyanocobalamin 1000 MCG tablet Take 1 tablet by mouth daily.    [provider]  dextromethorphan-guaiFENesin (MUCINEX DM) 30-600 MG 12hr tablet Take 1 tablet by mouth 2 (two) times daily. Patient not taking: Reported on 05/24/2022 08/22/21   Lorella Nimrod, MD  esomeprazole (NEXIUM) 40 MG capsule Take 40 mg by mouth daily.    [provider]  feeding supplement (ENSURE ENLIVE / ENSURE PLUS) LIQD Take 237 mLs by mouth 2 (two) times daily between meals. 08/22/21   Lorella Nimrod, MD  ferrous sulfate 325 (65 FE) MG tablet Take 1 tablet (325 mg total) by mouth 2 (two) times daily with a meal. 01/02/19   Vaughan Basta, MD  Fluticasone-Umeclidin-Vilant 100-62.5-25 MCG/INH AEPB Inhale 1 puff into the lungs daily. [place] 11/28/18   [provider]  gabapentin (NEURONTIN) 400 MG capsule Take 1 capsule (400 mg total) by mouth 3 (three) times daily. 06/01/22 08/30/22  Norman Clay, MD  ipratropium-albuterol (DUONEB) 0.5-2.5 (3) MG/3ML SOLN Take 3 mLs by nebulization every 6 (six) hours. 04/17/21   Sharen Hones, MD  isosorbide mononitrate (IMDUR) 30 MG 24 hr tablet Take 1 tablet by mouth daily. 01/24/22 01/24/23   [provider]  meclizine (ANTIVERT) 25 MG tablet Take by mouth. 11/13/21   [provider]  metFORMIN (GLUCOPHAGE) 500 MG tablet Take 1 tablet (500 mg total) by mouth 2 (two) times daily with a meal. 08/22/21   Lorella Nimrod, MD  metoprolol tartrate (LOPRESSOR) 25 MG tablet Take 25 mg by mouth 2 (two) times daily.     [provider]  mirtazapine (REMERON) 7.5 MG tablet Take 1 tablet (7.5 mg total) by mouth  at bedtime. 06/23/22 09/21/22  Norman Clay, MD  Multiple Vitamin (MULTIVITAMIN) tablet Take 1 tablet by mouth daily.    [provider]  nicotine (NICODERM CQ - DOSED IN MG/24 HOURS) 21 mg/24hr patch Place 1 patch (21 mg total) onto the skin daily. 04/18/21   Sharen Hones, MD  ondansetron (ZOFRAN) 4 MG tablet Take 1 tablet (4 mg total) by mouth every 6 (six) hours as needed for nausea. 08/22/21   Lorella Nimrod, MD  OXYGEN Inhale 2 L into the lungs continuous.    [provider]  pravastatin (PRAVACHOL) 80 MG tablet Take 80 mg by mouth every evening.     [provider]  predniSONE (DELTASONE) 10 MG tablet Take 4 tablets (40 mg total) by mouth daily with breakfast. For 3 days, then take 3 tablets for next 3 days, 2 tablets for the following 3 days, 1 tablet for 3 days and then stop it 08/23/21   Lorella Nimrod, MD  spironolactone (ALDACTONE) 25 MG tablet Take 25 mg by mouth daily.     [provider]  venlafaxine XR (EFFEXOR-XR) 150 MG 24 hr capsule Take 1 capsule (150 mg total) by mouth daily. Take total of 225 mg daily, take along with 75 mg cap 03/07/22 09/03/22  Norman Clay, MD  venlafaxine XR (EFFEXOR-XR) 75 MG 24 hr capsule Take 1 capsule (75 mg total) by mouth daily. Take total of 225 mg daily, take along with 150 mg cap 06/05/22 12/02/22  Norman Clay, MD   Physical Exam: Vitals:   07/04/22 1530 07/04/22 1600 07/04/22 1630 07/04/22 1726  BP: 127/78 134/82 119/71   Pulse: 80 90 86   Resp: 20 (!) 25 (!) 21 (!) 24  Temp:       TempSrc:      SpO2:    98%  Weight:      Height:       Constitutional: appears appropriate to chronological age, NAD Eyes: PERRL, lids and conjunctivae normal ENMT: Mucous membranes are moist. Posterior pharynx clear of any exudate or lesions. Age-appropriate dentition. Hearing appropriate Neck: normal, supple, no masses, no thyromegaly Respiratory: clear to auscultation bilaterally, + diffuse generalized wheezing.  No crackles.  Mild increased respiratory effort.  Mild accessory muscle use.  Cardiovascular: Regular rate and rhythm, no murmurs / rubs / gallops. No extremity edema. 2+ pedal pulses. No carotid bruits.  Abdomen: no tenderness, no masses palpated, no hepatosplenomegaly. Bowel sounds positive.  Musculoskeletal: no clubbing / cyanosis. No joint deformity upper and lower extremities. Good ROM, no contractures, no atrophy. Normal muscle tone.  Skin: no rashes, lesions, ulcers. No induration Neurologic: Sensation intact. Strength 5/5 in all 4.  Psychiatric: Normal judgment and insight. Alert and oriented x 3. Normal mood.   EKG: independently reviewed, showing sinus rhythm with rate of 82, QTc 453  Chest x-ray on Admission: I personally reviewed and I agree with radiologist reading as below.  DG Chest 2 View  Result Date: 07/04/2022 CLINICAL DATA:  Shortness of breath which is worsening. Palpitations. EXAM: CHEST - 2 VIEW COMPARISON:  11/17/2021.  08/18/2021.  Chest CT 05/21/2022. FINDINGS: Heart size upper limits of normal. Aortic atherosclerotic calcification is seen. Mild emphysematous changes in the upper lobes. Chronic scarring and volume loss in the lingula similar to the previous CT. Pleural scarring or fluid sub pulmonic on the left similar to the previous CT. No worsening or new finding. IMPRESSION: Persistent scarring and volume loss in the lingula. Persistent small amount of sub pulmonic fluid or  pleural scarring on the left. No new finding otherwise. Electronically Signed    By: Nelson Chimes M.D.   On: 07/04/2022 11:57    Labs on Admission: I have personally reviewed following labs  CBC: Recent Labs  Lab 07/04/22 1137  WBC 9.4  HGB 12.7  HCT 41.5  MCV 100.2*  PLT 161   Basic Metabolic Panel: Recent Labs  Lab 07/04/22 1129  NA 138  K 4.5  CL 95*  CO2 37*  GLUCOSE 100*  BUN 16  CREATININE 0.63  CALCIUM 9.0   GFR: Estimated Creatinine Clearance: 68.4 mL/min (by C-G formula based on SCr of 0.63 mg/dL).  Liver Function Tests: Recent Labs  Lab 07/04/22 1129  AST 14*  ALT <5  ALKPHOS 75  BILITOT 0.5  PROT 7.2  ALBUMIN 4.0   Urine analysis:    Component Value Date/Time   COLORURINE YELLOW 11/17/2021 1609   APPEARANCEUR CLEAR 11/17/2021 1609   LABSPEC >1.030 (H) 11/17/2021 1609   PHURINE 5.0 11/17/2021 1609   GLUCOSEU NEGATIVE 11/17/2021 1609   HGBUR NEGATIVE 11/17/2021 1609   BILIRUBINUR SMALL (A) 11/17/2021 1609   KETONESUR 15 (A) 11/17/2021 1609   PROTEINUR 30 (A) 11/17/2021 1609   NITRITE NEGATIVE 11/17/2021 1609   LEUKOCYTESUR NEGATIVE 11/17/2021 1609   CRITICAL CARE Performed by: Dr. Tobie Poet  Total critical care time: 35 minutes  Critical care time was exclusive of separately billable procedures and treating other patients.  Critical care was necessary to treat or prevent imminent or life-threatening deterioration.  Critical care was time spent personally by me on the following activities: development of treatment plan with patient and/or surrogate as well as nursing, discussions with consultants, evaluation of patient's response to treatment, examination of patient, obtaining history from patient or surrogate, ordering and performing treatments and interventions, ordering and review of laboratory studies, ordering and review of radiographic studies, pulse oximetry and re-evaluation of patient's condition.  Dr. Tobie Poet [facility]  If 7PM-7AM, please contact overnight-coverage provider If 7AM-7PM, please contact day  coverage provider [email or web address]  07/04/2022, 6:38 PM

## 2022-07-05 DIAGNOSIS — J441 Chronic obstructive pulmonary disease with (acute) exacerbation: Secondary | ICD-10-CM | POA: Diagnosis not present

## 2022-07-05 LAB — CBC
HCT: 38.7 % (ref 36.0–46.0)
Hemoglobin: 11.9 g/dL — ABNORMAL LOW (ref 12.0–15.0)
MCH: 30.4 pg (ref 26.0–34.0)
MCHC: 30.7 g/dL (ref 30.0–36.0)
MCV: 98.7 fL (ref 80.0–100.0)
Platelets: 225 10*3/uL (ref 150–400)
RBC: 3.92 MIL/uL (ref 3.87–5.11)
RDW: 14.3 % (ref 11.5–15.5)
WBC: 9.8 10*3/uL (ref 4.0–10.5)
nRBC: 0 % (ref 0.0–0.2)

## 2022-07-05 LAB — PHOSPHORUS: Phosphorus: 3.8 mg/dL (ref 2.5–4.6)

## 2022-07-05 LAB — BASIC METABOLIC PANEL
Anion gap: 6 (ref 5–15)
BUN: 17 mg/dL (ref 8–23)
CO2: 38 mmol/L — ABNORMAL HIGH (ref 22–32)
Calcium: 9.2 mg/dL (ref 8.9–10.3)
Chloride: 96 mmol/L — ABNORMAL LOW (ref 98–111)
Creatinine, Ser: 0.61 mg/dL (ref 0.44–1.00)
GFR, Estimated: >60 mL/min (ref >60)
Glucose, Bld: 124 mg/dL — ABNORMAL HIGH (ref 70–99)
Potassium: 5 mmol/L (ref 3.5–5.1)
Sodium: 140 mmol/L (ref 135–145)

## 2022-07-05 LAB — GLUCOSE, CAPILLARY
Glucose-Capillary: 128 mg/dL — ABNORMAL HIGH (ref 70–99)
Glucose-Capillary: 244 mg/dL — ABNORMAL HIGH (ref 70–99)
Glucose-Capillary: 212 mg/dL — ABNORMAL HIGH (ref 70–99)
Glucose-Capillary: 173 mg/dL — ABNORMAL HIGH (ref 70–99)

## 2022-07-05 LAB — HIV ANTIBODY (ROUTINE TESTING W REFLEX): HIV Screen 4th Generation wRfx: NONREACTIVE

## 2022-07-05 LAB — HEMOGLOBIN A1C
Hgb A1c MFr Bld: 6.1 % — ABNORMAL HIGH (ref 4.8–5.6)
Mean Plasma Glucose: 128.37 mg/dL

## 2022-07-05 LAB — MAGNESIUM: Magnesium: 2.4 mg/dL (ref 1.7–2.4)

## 2022-07-05 MED ORDER — METHYLPREDNISOLONE SODIUM SUCC 40 MG IJ SOLR
40.0000 mg | Freq: Two times a day (BID) | INTRAMUSCULAR | Status: AC
  Administered 2022-07-05 – 2022-07-06 (×2): 40 mg via INTRAVENOUS
  Filled 2022-07-05 (×2): qty 1

## 2022-07-05 MED ORDER — ORAL CARE MOUTH RINSE: 15.0000 mL | OROMUCOSAL | Status: DC | PRN

## 2022-07-05 MED ORDER — ORAL CARE MOUTH RINSE
15.0000 mL | OROMUCOSAL | Status: DC
  Administered 2022-07-05 – 2022-07-06 (×5): 15 mL via OROMUCOSAL

## 2022-07-05 NOTE — Progress Notes (Signed)
[place] [facility] [place] NAME: Suzanne Glenn    MR#:  [id number]  DATE OF BIRTH:  [birth year]  SUBJECTIVE:   Came in with increasing shortness of breath anxiety and palpitation. Hector placed on BiPAP overnight for COPD exacerbation. Currently wean off to 2 L nasal cannula oxygen. Patient overall feels a lot better. Lives at home with husband. No family at bedside.   VITALS:  Blood pressure 118/63, pulse 87, temperature 98.6 F (37 C), temperature source Oral, resp. rate (!) 30, height 5\' 3"  (1.6 m), weight 86.3 kg, SpO2 94 %.  PHYSICAL EXAMINATION:   GENERAL:  71 y.o.-year-old patient lying in the bed with no acute distress. Obesity LUNGS: Normal breath sounds bilaterally, no wheezing, rales, rhonchi.  CARDIOVASCULAR: S1, S2 normal. No murmurs, rubs, or gallops.  ABDOMEN: Soft, nontender, nondistended. Bowel sounds present.  EXTREMITIES: No  edema b/l.    NEUROLOGIC: nonfocal  patient is alert and awake SKIN: No obvious rash, lesion, or ulcer.   LABORATORY PANEL:  CBC Recent Labs  Lab 07/05/22 0742  WBC 9.8  HGB 11.9*  HCT 38.7  PLT 225    Chemistries  Recent Labs  Lab 07/04/22 1129 07/05/22 0321 07/05/22 0742  NA 138  --  140  K 4.5  --  5.0  CL 95*  --  96*  CO2 37*  --  38*  GLUCOSE 100*  --  124*  BUN 16  --  17  CREATININE 0.63  --  0.61  CALCIUM 9.0  --  9.2  MG  --  2.4  --   AST 14*  --   --   ALT <5  --   --   ALKPHOS 75  --   --   BILITOT 0.5  --   --    Cardiac Enzymes No results for input(s): "TROPONINI" in the last 168 hours. RADIOLOGY:  CT Angio Chest Pulmonary Embolism (PE) W or WO Contrast  Result Date: 07/04/2022 CLINICAL DATA:  Chest pain and shortness of breath EXAM: CT ANGIOGRAPHY CHEST WITH CONTRAST TECHNIQUE: Multidetector CT imaging of the chest was performed using the standard protocol during bolus administration of intravenous contrast. Multiplanar CT image reconstructions and MIPs were  obtained to evaluate the vascular anatomy. RADIATION DOSE REDUCTION: This exam was performed according to the departmental dose-optimization program which includes automated exposure control, adjustment of the mA and/or kV according to patient size and/or use of iterative reconstruction technique. CONTRAST:  183mL OMNIPAQUE IOHEXOL 350 MG/ML SOLN COMPARISON:  Plain film from earlier in the same day, 05/21/2022. FINDINGS: Cardiovascular: Atherosclerotic calcifications of the thoracic aorta are noted. No aneurysmal dilatation or dissection is seen. No cardiac enlargement is noted. Small pericardial effusion is seen. Coronary calcifications are noted. The pulmonary artery shows a normal branching pattern without intraluminal filling defect to suggest pulmonary embolism. Mediastinum/Nodes: Thoracic inlet is within normal limits. No hilar or mediastinal adenopathy is noted. The esophagus is within normal limits. Lungs/Pleura: Lungs are well aerated bilaterally. Right lung shows no focal infiltrate or sizable effusion. Similar findings are noted on the left although some mild nodularity is again noted in the left apex stable from the prior exam. Mild emphysematous changes are seen. Volume loss in the lingula is again identified stable from the prior exam likely related to prior radiation treatment fibrosis. No new focal abnormality is noted. Upper Abdomen: Gallbladder has been surgically removed. No acute abnormality is noted. Musculoskeletal: Degenerative changes of the thoracic  spine are seen. No acute rib abnormality is noted. Review of the MIP images confirms the above findings. IMPRESSION: No evidence of pulmonary emboli. Chronic changes with volume loss in the lingula. Some nodularity in the left upper lobe is again noted stable from the prior study. Continued attention on follow-up exams is recommended. Pericardial effusion stable in appearance from the prior exam. Aortic Atherosclerosis (ICD10-I70.0) and Emphysema  (ICD10-J43.9). Electronically Signed   By: Inez Catalina M.D.   On: 07/04/2022 19:52   DG Chest 2 View  Result Date: 07/04/2022 CLINICAL DATA:  Shortness of breath which is worsening. Palpitations. EXAM: CHEST - 2 VIEW COMPARISON:  11/17/2021.  08/18/2021.  Chest CT 05/21/2022. FINDINGS: Heart size upper limits of normal. Aortic atherosclerotic calcification is seen. Mild emphysematous changes in the upper lobes. Chronic scarring and volume loss in the lingula similar to the previous CT. Pleural scarring or fluid sub pulmonic on the left similar to the previous CT. No worsening or new finding. IMPRESSION: Persistent scarring and volume loss in the lingula. Persistent small amount of sub pulmonic fluid or pleural scarring on the left. No new finding otherwise. Electronically Signed   By: Nelson Chimes M.D.   On: 07/04/2022 11:57    Assessment and Plan . Suzanne Glenn is a 71 year old female with history of hypertension, GERD, neuropathy, COPD, continued tobacco use, hyperlipidemia, depression, anxiety, history of squamous cell carcinoma of the left lung, oxygen supplementation dependence, generalized anxiety disorder, insomnia, restless leg syndrome, who presents emergency department for chief concerns of shortness of breath and palpitations for 4 days.   She currently smokes 1 pack/day however could not smoke in the last 3 to 4 days due to somewhat short of breath.  Chest x-ray 2 view was read as persistent scarring and volume loss in the lingula.  Persistent small amount of subpulmonic fluid or pleural scarring on the left.   ED treatment DuoNebs x3, Solu-Medrol 125 mg IV.  Acute on chronic hypoxic respiratory failure secondary to COPD exacerbation ongoing tobacco abuse -- patient required BiPAP. Currently weaned off and on 2 L nasal cannula oxygen chronic which she uses at home -- IV Solu-Medrol -- continue nebulizer and inhalers -- smoking cessation advised  Major depression -- continue home  meds  Squamous cell carcinoma of the left lung -- follow-up outpatient oncology  Type II diabetes with hyperglycemia in the setting of steroids -- continue sliding scale and metformin -- A1c 6.1  Generalized anxiety disorder -- continue home meds  Hypertension -- amlodipine, metoprolol, Spironolactone, imdur  Body mass index is 33.7 kg/m. Obesity  Procedures: Family communication :none Consults :none CODE STATUS: FULL DVT Prophylaxis :lovenox Level of care: Med-Surg Status is: Inpatient Remains inpatient appropriate because: continue monitor respiratory status of remains stable discharged tomorrow. Transfer out of ICU.    TOTAL TIME TAKING CARE OF THIS PATIENT: 35 minutes.  >50% time spent on counselling and coordination of care  Note: This dictation was prepared with Dragon dictation along with smaller phrase technology. Any transcriptional errors that result from this process are unintentional.  Fritzi Mandes M.D    [facility]   CC: Primary care physician; Tracie Harrier, MD

## 2022-07-06 DIAGNOSIS — J441 Chronic obstructive pulmonary disease with (acute) exacerbation: Secondary | ICD-10-CM | POA: Diagnosis not present

## 2022-07-06 LAB — GLUCOSE, CAPILLARY: Glucose-Capillary: 141 mg/dL — ABNORMAL HIGH (ref 70–99)

## 2022-07-06 NOTE — Discharge Summary (Signed)
Physician Discharge Summary   Patient: Suzanne Glenn MRN: [id number] DOB: 09-30-1951  Admit date:     07/04/2022  Discharge date: 07/06/22  Discharge Physician: Fritzi Mandes   PCP: Tracie Harrier, MD   Recommendations at discharge:   follow-up PCP in 1 to 2 weeks follow-up pulmonary Dr. Raul Del in 1 to 2 weeks use your inhalers, oxygen, nebulizer as before  Discharge Diagnoses: Principal Problem:   COPD exacerbation ([place]) Active Problems:   Coronary artery disease   Diabetes mellitus, type 2 ([place])   GAD (generalized anxiety disorder)   Essential (primary) hypertension   Type 2 diabetes mellitus ([place])   Controlled type 2 diabetes mellitus without complication (HCC)   Moderate COPD (chronic obstructive pulmonary disease) (HCC)   Tobacco use   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HCC)   MDD (major depressive disorder), recurrent, in full remission ([place])   Acute on chronic respiratory failure with hypoxia (HCC)   HLD (hyperlipidemia)   RLS (restless legs syndrome)  [name] is a 71 year old female with history of hypertension, GERD, neuropathy, COPD, continued tobacco use, hyperlipidemia, depression, anxiety, history of squamous cell carcinoma of the left lung, oxygen supplementation dependence, generalized anxiety disorder, insomnia, restless leg syndrome, who presents emergency department for chief concerns of shortness of breath and palpitations for 4 days.   She currently smokes 1 pack/day however could not smoke in the last 3 to 4 days due to somewhat short of breath.   Chest x-ray 2 view was read as persistent scarring and volume loss in the lingula.  Persistent small amount of subpulmonic fluid or pleural scarring on the left.   ED treatment DuoNebs x3, Solu-Medrol 125 mg IV.   Acute on chronic hypoxic respiratory failure secondary to COPD exacerbation ongoing tobacco abuse -- patient required BiPAP. Currently weaned off and on 2 L nasal cannula oxygen chronic  which she uses at home -- received IV Solu-Medrol-- patient currently not wheezing. -- continue nebulizer and inhalers -- smoking cessation advised  -- she is back to baseline. Ambulated with the nurse without unusual shortness of breath  Major depression -- continue home meds   Squamous cell carcinoma of the left lung -- follow-up outpatient oncology   Type II diabetes with hyperglycemia in the setting of steroids -- continue sliding scale and metformin -- A1c 6.1   Generalized anxiety disorder -- continue home meds   Hypertension -- amlodipine, metoprolol, Spironolactone, imdur   Body mass index is 33.7 kg/m. Obesity  Overall hemodynamically stable. Discharge to home. Patient in agreement.   Family communication :none Consults :none CODE STATUS: FULL DVT Prophylaxis :lovenox      Disposition: Home Diet recommendation:  Discharge Diet Orders (From admission, onward)     Start     Ordered   07/06/22 0000  Diet - low sodium heart healthy        07/06/22 1028           Cardiac and Carb modified diet DISCHARGE MEDICATION: Allergies as of 07/06/2022       Reactions   Diclofenac-misoprostol Other (See Comments)   Arthrotec - gastritis    Nsaids Other (See Comments)   gastritis   Zolpidem Other (See Comments)   Sleep walking    Hydrocodone-acetaminophen Nausea And Vomiting   Simvastatin Other (See Comments)   body aches        Medication List     STOP taking these medications    dextromethorphan-guaiFENesin 30-600 MG 12hr tablet Commonly known as: MUCINEX  DM   meclizine 25 MG tablet Commonly known as: ANTIVERT   multivitamin tablet   ondansetron 4 MG tablet Commonly known as: ZOFRAN   predniSONE 10 MG tablet Commonly known as: DELTASONE       TAKE these medications    Accu-Chek FastClix Lancets Misc   Accu-Chek SmartView test strip Generic drug: glucose blood   albuterol 108 (90 Base) MCG/ACT inhaler Commonly known as: VENTOLIN  HFA Inhale 2 puffs into the lungs every 4 (four) hours as needed for wheezing or shortness of breath.   amLODipine 10 MG tablet Commonly known as: NORVASC Take 10 mg by mouth daily.   aspirin 81 MG chewable tablet Chew by mouth daily.   carbidopa-levodopa 25-100 MG tablet Commonly known as: SINEMET IR Take 1 tablet by mouth 3 (three) times daily.   cyanocobalamin 1000 MCG tablet Take 1 tablet by mouth daily.   esomeprazole 40 MG capsule Commonly known as: NEXIUM Take 40 mg by mouth daily.   feeding supplement Liqd Take 237 mLs by mouth 2 (two) times daily between meals.   ferrous sulfate 325 (65 FE) MG tablet Take 1 tablet (325 mg total) by mouth 2 (two) times daily with a meal.   Fluticasone-Umeclidin-Vilant 100-62.5-25 MCG/INH Aepb Inhale 1 puff into the lungs daily. Treligy   gabapentin 400 MG capsule Commonly known as: NEURONTIN Take 1 capsule (400 mg total) by mouth 3 (three) times daily.   ipratropium-albuterol 0.5-2.5 (3) MG/3ML Soln Commonly known as: DUONEB Take 3 mLs by nebulization every 6 (six) hours.   isosorbide mononitrate 30 MG 24 hr tablet Commonly known as: IMDUR Take 1 tablet by mouth daily.   metFORMIN 500 MG tablet Commonly known as: GLUCOPHAGE Take 1 tablet (500 mg total) by mouth 2 (two) times daily with a meal.   metoprolol tartrate 25 MG tablet Commonly known as: LOPRESSOR Take 25 mg by mouth 2 (two) times daily.   mirtazapine 7.5 MG tablet Commonly known as: REMERON Take 1 tablet (7.5 mg total) by mouth at bedtime.   nicotine 21 mg/24hr patch Commonly known as: NICODERM CQ - dosed in mg/24 hours Place 1 patch (21 mg total) onto the skin daily.   OXYGEN Inhale 2 L into the lungs continuous.   pravastatin 80 MG tablet Commonly known as: PRAVACHOL Take 80 mg by mouth every evening.   spironolactone 25 MG tablet Commonly known as: ALDACTONE Take 25 mg by mouth daily.   venlafaxine XR 150 MG 24 hr capsule Commonly known as:  EFFEXOR-XR Take 1 capsule (150 mg total) by mouth daily. Take total of 225 mg daily, take along with 75 mg cap   venlafaxine XR 75 MG 24 hr capsule Commonly known as: EFFEXOR-XR Take 1 capsule (75 mg total) by mouth daily. Take total of 225 mg daily, take along with 150 mg cap        Follow-up Information     Hande, Vishwanath, MD. Schedule an appointment as soon as possible for a visit in 1 week(s).   Specialty: Internal Medicine Contact information: [address] [place] [address] [phone]         Erby Pian, MD. Schedule an appointment as soon as possible for a visit in 1 week(s).   Specialty: Specialist Why: copd Contact information: [place] [address] [phone]                Discharge Exam: [facility] Weights  07/04/22 1136 07/04/22 2115  Weight: 86.8 kg 86.3 kg     Condition at discharge: fair  The results of significant diagnostics from this hospitalization (including imaging, microbiology, ancillary and laboratory) are listed below for reference.   Imaging Studies: CT Angio Chest Pulmonary Embolism (PE) W or WO Contrast  Result Date: 07/04/2022 CLINICAL DATA:  Chest pain and shortness of breath EXAM: CT ANGIOGRAPHY CHEST WITH CONTRAST TECHNIQUE: Multidetector CT imaging of the chest was performed using the standard protocol during bolus administration of intravenous contrast. Multiplanar CT image reconstructions and MIPs were obtained to evaluate the vascular anatomy. RADIATION DOSE REDUCTION: This exam was performed according to the departmental dose-optimization program which includes automated exposure control, adjustment of the mA and/or kV according to patient size and/or use of iterative reconstruction technique. CONTRAST:  150mL OMNIPAQUE IOHEXOL 350 MG/ML SOLN COMPARISON:  Plain film from earlier in the same day, 05/21/2022. FINDINGS:  Cardiovascular: Atherosclerotic calcifications of the thoracic aorta are noted. No aneurysmal dilatation or dissection is seen. No cardiac enlargement is noted. Small pericardial effusion is seen. Coronary calcifications are noted. The pulmonary artery shows a normal branching pattern without intraluminal filling defect to suggest pulmonary embolism. Mediastinum/Nodes: Thoracic inlet is within normal limits. No hilar or mediastinal adenopathy is noted. The esophagus is within normal limits. Lungs/Pleura: Lungs are well aerated bilaterally. Right lung shows no focal infiltrate or sizable effusion. Similar findings are noted on the left although some mild nodularity is again noted in the left apex stable from the prior exam. Mild emphysematous changes are seen. Volume loss in the lingula is again identified stable from the prior exam likely related to prior radiation treatment fibrosis. No new focal abnormality is noted. Upper Abdomen: Gallbladder has been surgically removed. No acute abnormality is noted. Musculoskeletal: Degenerative changes of the thoracic spine are seen. No acute rib abnormality is noted. Review of the MIP images confirms the above findings. IMPRESSION: No evidence of pulmonary emboli. Chronic changes with volume loss in the lingula. Some nodularity in the left upper lobe is again noted stable from the prior study. Continued attention on follow-up exams is recommended. Pericardial effusion stable in appearance from the prior exam. Aortic Atherosclerosis (ICD10-I70.0) and Emphysema (ICD10-J43.9). Electronically Signed   By: Inez Catalina M.D.   On: 07/04/2022 19:52   DG Chest 2 View  Result Date: 07/04/2022 CLINICAL DATA:  Shortness of breath which is worsening. Palpitations. EXAM: CHEST - 2 VIEW COMPARISON:  11/17/2021.  08/18/2021.  Chest CT 05/21/2022. FINDINGS: Heart size upper limits of normal. Aortic atherosclerotic calcification is seen. Mild emphysematous changes in the upper lobes.  Chronic scarring and volume loss in the lingula similar to the previous CT. Pleural scarring or fluid sub pulmonic on the left similar to the previous CT. No worsening or new finding. IMPRESSION: Persistent scarring and volume loss in the lingula. Persistent small amount of sub pulmonic fluid or pleural scarring on the left. No new finding otherwise. Electronically Signed   By: Nelson Chimes M.D.   On: 07/04/2022 11:57    Microbiology: Results for orders placed or performed during the hospital encounter of 07/04/22  SARS Coronavirus 2 by RT PCR (hospital order, performed in [facility] hospital lab) *cepheid single result test* Anterior Nasal Swab     Status: None   Collection Time: 07/04/22  2:39 PM   Specimen: Anterior Nasal Swab  Result Value Ref Range Status   SARS Coronavirus 2 by RT PCR NEGATIVE NEGATIVE Final    Comment: (NOTE)  SARS-CoV-2 target nucleic acids are NOT DETECTED.  The SARS-CoV-2 RNA is generally detectable in upper and lower respiratory specimens during the acute phase of infection. The lowest concentration of SARS-CoV-2 viral copies this assay can detect is 250 copies / mL. A negative result does not preclude SARS-CoV-2 infection and should not be used as the sole basis for treatment or other patient management decisions.  A negative result may occur with improper specimen collection / handling, submission of specimen other than nasopharyngeal swab, presence of viral mutation(s) within the areas targeted by this assay, and inadequate number of viral copies (<250 copies / mL). A negative result must be combined with clinical observations, patient history, and epidemiological information.  Fact Sheet for Patients:   https://www.Lachelle Rissler.info/  Fact Sheet for Healthcare Providers: https://hall.com/  This test is not yet approved or  cleared by the [place] FDA and has been authorized for detection and/or diagnosis of  SARS-CoV-2 by FDA under an Emergency Use Authorization (EUA).  This EUA will remain in effect (meaning this test can be used) for the duration of the COVID-19 declaration under Section 564(b)(1) of the Act, 21 U.S.C. section 360bbb-3(b)(1), unless the authorization is terminated or revoked sooner.  Performed at [facility], [address] [place], [place] [address]   MRSA Next Gen by PCR, Nasal     Status: None   Collection Time: 07/04/22  9:23 PM   Specimen: Nasal Mucosa; Nasal Swab  Result Value Ref Range Status   MRSA by PCR Next Gen NOT DETECTED NOT DETECTED Final    Comment: (NOTE) The GeneXpert MRSA Assay (FDA approved for NASAL specimens only), is one component of a comprehensive MRSA colonization surveillance program. It is not intended to diagnose MRSA infection nor to guide or monitor treatment for MRSA infections. Test performance is not FDA approved in patients less than 84 years old. Performed at [facility], [address] [place], [place] [address]     Labs: CBC: Recent Labs  Lab 07/04/22 1137 07/05/22 0742  WBC 9.4 9.8  HGB 12.7 11.9*  HCT 41.5 38.7  MCV 100.2* 98.7  PLT 247 824   Basic Metabolic Panel: Recent Labs  Lab 07/04/22 1129 07/05/22 0321 07/05/22 0742  NA 138  --  140  K 4.5  --  5.0  CL 95*  --  96*  CO2 37*  --  38*  GLUCOSE 100*  --  124*  BUN 16  --  17  CREATININE 0.63  --  0.61  CALCIUM 9.0  --  9.2  MG  --  2.4  --   PHOS  --  3.8  --    Liver Function Tests: Recent Labs  Lab 07/04/22 1129  AST 14*  ALT <5  ALKPHOS 75  BILITOT 0.5  PROT 7.2  ALBUMIN 4.0   CBG: Recent Labs  Lab 07/05/22 0724 07/05/22 1109 07/05/22 1635 07/05/22 2038 07/06/22 0824  GLUCAP 128* 244* 212* 173* 141*    Discharge time spent: greater than 30 minutes.  Signed: Fritzi Mandes, MD [facility] 07/06/2022

## 2022-07-06 NOTE — Care Management Important Message (Signed)
Important Message  Patient Details  Name: Suzanne Glenn MRN: [id number] Date of Birth: 09-29-1951   Medicare Important Message Given:  N/A - LOS <3 / Initial given by admissions     Dannette Barbara 07/06/2022, 9:29 AM

## 2022-07-06 NOTE — Evaluation (Signed)
Clinical/Bedside Swallow Evaluation Patient Details  Name: Suzanne Glenn MRN: [id number] Date of Birth: Jul 06, 1951  Today's Date: 07/06/2022 Time: SLP Start Time (ACUTE ONLY): 5176 SLP Stop Time (ACUTE ONLY): 0920 SLP Time Calculation (min) (ACUTE ONLY): 45 min  Past Medical History:  Past Medical History:  Diagnosis Date   Anxiety    Asthma    Cancer ([place]) 12/2018   w/u for right upper lobe mass/cancer   COPD (chronic obstructive pulmonary disease) ([place])    also, emphysema. now using o2 via np 24 hours a day   Coronary artery disease    Depression    Diabetes mellitus, type II (HCC)    GERD (gastroesophageal reflux disease)    Headache    migraines in early 20's   Heart murmur    HLD (hyperlipidemia)    Hypertension    Lung cancer ([place])    Neuropathy    Restless leg    Past Surgical History:  Past Surgical History:  Procedure Laterality Date   BACK SURGERY  2005   surgery x 2, disc fused in neck, pinched nerve in center of back and neck   CARDIAC CATHETERIZATION  2015   1 stent placed for blockage   cervical bone infusion     CHOLECYSTECTOMY     DILATION AND CURETTAGE OF UTERUS     FOOT SURGERY Left    foot bone spur removed   THORACIC DISC SURGERY     TUBAL LIGATION     VIDEO BRONCHOSCOPY WITH ENDOBRONCHIAL ULTRASOUND N/A 03/06/2019   Procedure: VIDEO BRONCHOSCOPY WITH ENDOBRONCHIAL ULTRASOUND - DIABETIC;  Surgeon: Ottie Glazier, MD;  Location: [facility] ORS;  Service: Thoracic;  Laterality: N/A;   HPI:  Pt is a 71 year old female with history of Obesity, hypertension, GERD, neuropathy, COPD, w/ continued tobacco use, hyperlipidemia, depression, anxiety, history of squamous cell carcinoma of the left lung, oxygen supplementation dependence, generalized anxiety disorder, insomnia, restless leg syndrome, who presents emergency department for chief concerns of shortness of breath and palpitations for 4 days.  Initial vitals in the ED showed temperature of 98.2,  respiration rate of 20, heart rate of 85, blood pressure 136/68, SPO2 of 94% on 2 L nasal cannula.   Chest x-ray 2 view was read as persistent scarring and volume loss in the lingula.  Persistent small amount of subpulmonic fluid or pleural scarring on the left.  On home O2 [name].    Assessment / Plan / Recommendation  Clinical Impression   Pt seen for BSE this morning. Pt A/O x4; verbal and followed all instructions. Noted easy WOB w/ increased exertion including talking. Baseline COPD. Edentulous.  On [place] O2 support- 3L; afebrile, WBC not elevated.    OF NOTE: Pt w/ baseline h/o GERD, Edentulous. She stated "certain" foods are more difficult for her to eat- described particulates, tougher solids. Pt reported an isolated event of coughing when eating crumbly eggs this AM-- "they tried to go up my nose in the back of my throat". Discussed taking Single, Small bites of foods, moistening foods.  Pt denied any consistent events of difficulty swallowing.   Pt appears to present w/ adequate oropharyngeal phase swallowing function w/ No overt oropharyngeal phase dysphagia appreciated during oral intake of trials, breakfast meal; No neuromuscular swallowing deficits appreciated. Pt appears at reduced risk for aspiration from an oropharyngeal phase standpoint following general aspiration precautions. HOWEVER, pt has a baseline of GERD and intermittent episodes of REFLUX behavior. ANY Dysmotility or Regurgitation of Reflux material can impact the  timing of swallowing thus increase risk for aspiration of the Reflux material during Retrograde flow; this can impact Pulmonary status.     Pt sat upright in bed and consumed several trials of thin liquids Via Cup/Straw, purees, and soft-solid foods w/ No immediate, overt clinical s/s of aspiration noted; clear vocal quality b/t trials, no decline in pulmonary status, no multiple swallows noted post initial pharyngeal swallow, no decline in O2 sats(96-97%). No cough. Oral  phase appeared [facility] for bolus management and timely A-P transfer/clearing of material. Mastication impacted by Edentulous status-- pt gummed foods adequate for swallowing.  OM exam was [facility] for oral clearing; lingual/labial movements. No unilateral weakness. Speech clear.    Recommend continue a fairly Regular diet (cut, moistened foods) w/ thin liquids. General aspiration precautions. Rest Breaks during meals/oral intake to allow for Esophageal clearing and to calm breathing/WOB. GERD/REFLUX precautions; encouraged PPI as prescribed by MD and f/u w/ GI as needed for ongoing management of Reflux. Discussion and handouts given on general REFLUX and aspiration precautions.  MD to reconsult ST services if any new needs while admitted. NSG updated. Pt appreciative of Education information. SLP Visit Diagnosis: Dysphagia, unspecified (R13.10) (Edentulous; GERD)    Aspiration Risk   (reduced following general precautions)    Diet Recommendation   a fairly Regular diet (cut, moistened foods) w/ thin liquids. General aspiration precautions. Rest Breaks during meals/oral intake to allow for Esophageal clearing and to calm breathing/WOB. GERD/REFLUX precautions. Medication Administration: Whole meds with liquid (vs Whole in Puree IF needed for ease of swallowing and Esophageal motility)    Other  Recommendations Recommended Consults:  (Dietician f/u) Oral Care Recommendations: Oral care BID;Oral care before and after PO;Patient independent with oral care Other Recommendations:  (n/a)    Recommendations for follow up therapy are one component of a multi-disciplinary discharge planning process, led by the attending physician.  Recommendations may be updated based on patient status, additional functional criteria and insurance authorization.  Follow up Recommendations No SLP follow up      Assistance Recommended at Discharge None  Functional Status Assessment Patient has had a recent decline in their  functional status and demonstrates the ability to make significant improvements in function in a reasonable and predictable amount of time.  Frequency and Duration  (n/a)   (n/a)       Prognosis Prognosis for Safe Diet Advancement: Good Barriers to Reach Goals: Time post onset;Severity of deficits Barriers/Prognosis Comment: baseline Edentulous status; GERD      Swallow Study   General Date of Onset: 07/04/22 HPI: Pt is a 71 year old female with history of Obesity, hypertension, GERD, neuropathy, COPD, w/ continued tobacco use, hyperlipidemia, depression, anxiety, history of squamous cell carcinoma of the left lung, oxygen supplementation dependence, generalized anxiety disorder, insomnia, restless leg syndrome, who presents emergency department for chief concerns of shortness of breath and palpitations for 4 days.  Initial vitals in the ED showed temperature of 98.2, respiration rate of 20, heart rate of 85, blood pressure 136/68, SPO2 of 94% on 2 L nasal cannula.   Chest x-ray 2 view was read as persistent scarring and volume loss in the lingula.  Persistent small amount of subpulmonic fluid or pleural scarring on the left.  On home O2 [place]. Type of Study: Bedside Swallow Evaluation Previous Swallow Assessment: none Diet Prior to this Study: Regular;Thin liquids Temperature Spikes Noted: No (wbc 9.8) Respiratory Status: Nasal cannula (3L) History of Recent Intubation: No Behavior/Cognition: Alert;Cooperative;Pleasant mood Oral Cavity Assessment: Within Functional  Limits Oral Care Completed by SLP: Recent completion by staff Oral Cavity - Dentition: Edentulous Vision: Functional for self-feeding Self-Feeding Abilities: Able to feed self Patient Positioning: Upright in bed Baseline Vocal Quality: Normal Volitional Cough: Strong Volitional Swallow: Able to elicit    Oral/Motor/Sensory Function Overall Oral Motor/Sensory Function: Within functional limits   Ice Chips Ice chips: Not  tested   Thin Liquid Thin Liquid: Within functional limits Presentation: Self Fed;Straw;Cup (5+ trials each)    Nectar Thick Nectar Thick Liquid: Not tested   Honey Thick Honey Thick Liquid: Not tested   Puree Puree: Within functional limits Presentation: Self Fed;Spoon (4-5 trials)   Solid     Solid: Within functional limits (softened in puree-- semi-solid d/t edentulous status) Presentation: Self Fed;Spoon Other Comments: min increased time for [place], MS, [place]; [place] [phone] (ascom) Reanne Nellums 07/06/2022,1:05 PM

## 2022-07-06 NOTE — Discharge Instructions (Signed)
Use your oxygen, inhaler, nebulizer as before smoking cessation advised

## 2022-07-06 NOTE — Progress Notes (Signed)
Mobility Specialist - Progress Note  Pre-mobility: SpO2 93% During mobility: SpO2 87-90% Post-mobility: SpO2 92%   07/06/22 1036  Mobility  Activity Ambulated with assistance in hallway  Level of Assistance Standby assist, set-up cues, supervision of patient - no hands on  Assistive Device None  Distance Ambulated (ft) 80 ft  Activity Response Tolerated well  $Mobility charge 1 Mobility   Pt supine upon entry, utilizing [place] 3L. Pt ambulated 80 ft in the hallway without AD, with supervision. Approximately 40 ft into ambulation Pt O2 dropped to 87%, returning to 90% after 5 mins. Pt left supine with O2 at 92%, with alarm set and needs within reach.   [place]ie [place] Mobility Specialist 07/06/22 10:47 AM

## 2022-07-06 NOTE — Progress Notes (Signed)
Pt discharged per MD order. IV removed. Discharge instructions reviewed with pt. Pt verbalized understanding. All questions answered to pt satisfaction. Pt brought wife's oxygen from home for transport.

## 2022-07-09 DIAGNOSIS — J9611 Chronic respiratory failure with hypoxia: Secondary | ICD-10-CM | POA: Diagnosis not present

## 2022-07-09 DIAGNOSIS — Z87891 Personal history of nicotine dependence: Secondary | ICD-10-CM | POA: Diagnosis not present

## 2022-07-09 DIAGNOSIS — C3412 Malignant neoplasm of upper lobe, left bronchus or lung: Secondary | ICD-10-CM | POA: Diagnosis not present

## 2022-07-09 DIAGNOSIS — F324 Major depressive disorder, single episode, in partial remission: Secondary | ICD-10-CM | POA: Diagnosis not present

## 2022-07-09 DIAGNOSIS — E119 Type 2 diabetes mellitus without complications: Secondary | ICD-10-CM | POA: Diagnosis not present

## 2022-07-09 DIAGNOSIS — Z23 Encounter for immunization: Secondary | ICD-10-CM | POA: Diagnosis not present

## 2022-07-09 DIAGNOSIS — Z09 Encounter for follow-up examination after completed treatment for conditions other than malignant neoplasm: Secondary | ICD-10-CM | POA: Diagnosis not present

## 2022-07-09 DIAGNOSIS — G2581 Restless legs syndrome: Secondary | ICD-10-CM | POA: Diagnosis not present

## 2022-07-09 DIAGNOSIS — K219 Gastro-esophageal reflux disease without esophagitis: Secondary | ICD-10-CM | POA: Diagnosis not present

## 2022-07-09 NOTE — Progress Notes (Unsigned)
[facility] MD/PA/NP OP Progress Note  07/12/2022 11:31 AM Suzanne Glenn  MRN:  [id number]  Chief Complaint:  Chief Complaint  Patient presents with   Follow-up   Medication Refill   HPI:  - she was admitted due to COPD exacerbation since the last visit.   This is a follow-up appointment for depression and anxiety.  She states that she was in the hospital.  She had a panic attack when she sew that her situation was low.  She has been doing better since being discharged.  She did denies any anxiety or panic attacks since then.  She should.  A picture of her great grandson and grandson.  Her grandson visits her every day.  She and her husband enjoys his visit.  She continues to smoke half pack per day.  She tried to get down more than 10 cigarettes/day.  She does not think medication would help.  She has been sleeping better most of the days.  Although she feels down at times, it has been manageable.  She denies SI.  She would like to lower the dose of gabapentin as she does not think it has been helping her.    Daily routine: household chores, sees her son every day with 74 yo grandchildren  Exercise: Employment: used to work at [employer], [place] in [place]: husband  Household: husband in mobile home Marital status: married for 20 years Number of children: 2. (Her daughter passed away in January 21, 2002 from diabetes at 70 year old) She grew up in [place]. She used to have a very close family. There is some discordance with one of her sisters   Visit Diagnosis:    ICD-10-CM   1. Restless leg syndrome  G25.81 Ferritin    2. MDD (major depressive disorder), recurrent, in partial remission ([place])  F33.41     3. GAD (generalized anxiety disorder)  F41.1     4. Insomnia, unspecified type  G47.00       Past Psychiatric History: Please see initial evaluation for full details. I have reviewed the history. No updates at this time.     Past Medical History:  Past Medical History:  Diagnosis Date    Anxiety    Asthma    Cancer ([place]) 22-Jan-2019   w/u for right upper lobe mass/cancer   COPD (chronic obstructive pulmonary disease) (HCC)    also, emphysema. now using o2 via np 24 hours a day   Coronary artery disease    Depression    Diabetes mellitus, type II (HCC)    GERD (gastroesophageal reflux disease)    Headache    migraines in early January 22, 2023   Heart murmur    HLD (hyperlipidemia)    Hypertension    Lung cancer ([place])    Neuropathy    Restless leg     Past Surgical History:  Procedure Laterality Date   BACK SURGERY  January 22, 2004   surgery x 2, disc fused in neck, pinched nerve in center of back and neck   CARDIAC CATHETERIZATION  21-Jan-2014   1 stent placed for blockage   cervical bone infusion     CHOLECYSTECTOMY     DILATION AND CURETTAGE OF UTERUS     FOOT SURGERY Left    foot bone spur removed   THORACIC DISC SURGERY     TUBAL LIGATION     VIDEO BRONCHOSCOPY WITH ENDOBRONCHIAL ULTRASOUND N/A 03/06/2019   Procedure: VIDEO BRONCHOSCOPY WITH ENDOBRONCHIAL ULTRASOUND - DIABETIC;  Surgeon: Ottie Glazier, MD;  Location:  [facility] ORS;  Service: Thoracic;  Laterality: N/A;    Family Psychiatric History: Please see initial evaluation for full details. I have reviewed the history. No updates at this time.     Family History:  Family History  Problem Relation Age of Onset   Anxiety disorder Mother    Cancer - Lung Mother    Depression Sister    Cancer Sister    Cancer Sister    Cancer Sister    Cancer Sister    Heart Problems Sister    Bipolar disorder Sister    Diabetes Sister    Cancer - Lung Sister    Hypertension Sister    Diabetes Sister    Hyperlipidemia Sister    COPD Sister    Depression Brother    Heart Problems Brother    Cancer Brother    Cancer Brother    Cancer Brother    Breast cancer Maternal Aunt    Depression Son     Social History:  Social History   Socioeconomic History   Marital status: Married    Spouse name: Abe People   Number of children: Not on  file   Years of education: Not on file   Highest education level: Not on file  Occupational History    Comment: disabled  Tobacco Use   Smoking status: Every Day    Packs/day: 1.00    Years: 40.00    Total pack years: 40.00    Types: Cigarettes    Start date: 05/05/1975   Smokeless tobacco: Never  Vaping Use   Vaping Use: Never used  Substance and Sexual Activity   Alcohol use: No    Alcohol/week: 0.0 standard drinks of alcohol    Comment: socially   Drug use: No   Sexual activity: Not Currently  Other Topics Concern   Not on file  Social History Narrative   Not on file   Social Determinants of Health   Financial Resource Strain: Not on file  Food Insecurity: No Food Insecurity (07/05/2022)   Hunger Vital Sign    Worried About Running Out of Food in the Last Year: Never true    Ran Out of Food in the Last Year: Never true  Transportation Needs: No Transportation Needs (07/05/2022)   PRAPARE - Hydrologist (Medical): No    Lack of Transportation (Non-Medical): No  Physical Activity: Not on file  Stress: Not on file  Social Connections: Not on file    Allergies:  Allergies  Allergen Reactions   Diclofenac-Misoprostol Other (See Comments)    Arthrotec - gastritis    Nsaids Other (See Comments)    gastritis   Zolpidem Other (See Comments)    Sleep walking    Hydrocodone-Acetaminophen Nausea And Vomiting   Simvastatin Other (See Comments)    body aches    Metabolic Disorder Labs: Lab Results  Component Value Date   HGBA1C 6.1 (H) 07/05/2022   MPG 128.37 07/05/2022   MPG 148.46 08/20/2021   No results found for: "PROLACTIN" No results found for: "CHOL", "TRIG", "HDL", "CHOLHDL", "VLDL", "LDLCALC" No results found for: "TSH"  Therapeutic Level Labs: No results found for: "LITHIUM" No results found for: "VALPROATE" No results found for: "CBMZ"  Current Medications: Current Outpatient Medications  Medication Sig Dispense Refill    ACCU-CHEK FASTCLIX LANCETS MISC      ACCU-CHEK SMARTVIEW test strip      albuterol (VENTOLIN HFA) 108 (90 Base) MCG/ACT inhaler Inhale 2 puffs into the  lungs every 4 (four) hours as needed for wheezing or shortness of breath.      amLODipine (NORVASC) 10 MG tablet Take 10 mg by mouth daily.      aspirin 81 MG chewable tablet Chew by mouth daily.     carbidopa-levodopa (SINEMET IR) 25-100 MG tablet Take 1 tablet by mouth 3 (three) times daily.     cyanocobalamin 1000 MCG tablet Take 1 tablet by mouth daily.     esomeprazole (NEXIUM) 40 MG capsule Take 40 mg by mouth daily.     feeding supplement (ENSURE ENLIVE / ENSURE PLUS) LIQD Take 237 mLs by mouth 2 (two) times daily between meals. 237 mL 12   ferrous sulfate 325 (65 FE) MG tablet Take 1 tablet (325 mg total) by mouth 2 (two) times daily with a meal. 30 tablet 3   Fluticasone-Umeclidin-Vilant 100-62.5-25 MCG/INH AEPB Inhale 1 puff into the lungs daily. [place]     gabapentin (NEURONTIN) 300 MG capsule Take 1 capsule (300 mg total) by mouth 3 (three) times daily. 90 capsule 1   ipratropium-albuterol (DUONEB) 0.5-2.5 (3) MG/3ML SOLN Take 3 mLs by nebulization every 6 (six) hours. 360 mL 0   isosorbide mononitrate (IMDUR) 30 MG 24 hr tablet Take 1 tablet by mouth daily.     metFORMIN (GLUCOPHAGE) 500 MG tablet Take 1 tablet (500 mg total) by mouth 2 (two) times daily with a meal. 60 tablet 1   metoprolol tartrate (LOPRESSOR) 25 MG tablet Take 25 mg by mouth 2 (two) times daily.      mirtazapine (REMERON) 7.5 MG tablet Take 1 tablet (7.5 mg total) by mouth at bedtime. 90 tablet 0   OXYGEN Inhale 2 L into the lungs continuous.     pravastatin (PRAVACHOL) 80 MG tablet Take 80 mg by mouth every evening.      spironolactone (ALDACTONE) 25 MG tablet Take 25 mg by mouth daily.      venlafaxine XR (EFFEXOR-XR) 150 MG 24 hr capsule Take 1 capsule (150 mg total) by mouth daily. Take total of 225 mg daily, take along with 75 mg cap 90 capsule 1    venlafaxine XR (EFFEXOR-XR) 75 MG 24 hr capsule Take 1 capsule (75 mg total) by mouth daily. Take total of 225 mg daily, take along with 150 mg cap 90 capsule 1   nicotine (NICODERM CQ - DOSED IN MG/24 HOURS) 21 mg/24hr patch Place 1 patch (21 mg total) onto the skin daily. (Patient not taking: Reported on 07/12/2022) 28 patch 0   No current facility-administered medications for this visit.     Musculoskeletal: Strength & Muscle Tone: within normal limits Gait & Station: normal Patient leans: N/A  Psychiatric Specialty Exam: Review of Systems  Psychiatric/Behavioral:  Positive for dysphoric mood and sleep disturbance. Negative for agitation, behavioral problems, confusion, decreased concentration, hallucinations, self-injury and suicidal ideas. The patient is nervous/anxious. The patient is not hyperactive.   All other systems reviewed and are negative.   Blood pressure (!) 153/69, pulse 94, temperature 97.8 F (36.6 C), temperature source Temporal, height 5\' 3"  (1.6 m), weight 191 lb (86.6 kg), SpO2 (!) 89 %.Body mass index is 33.83 kg/m.  General Appearance: Fairly Groomed, wearing oxygen nasal canula  Eye Contact:  Good  Speech:  Clear and Coherent  Volume:  Normal  Mood:   good  Affect:  Appropriate, Congruent, and Full Range  Thought Process:  Coherent  Orientation:  Full (Time, Place, and Person)  Thought Content: Logical   Suicidal Thoughts:  No  Homicidal Thoughts:  No  Memory:  Immediate;   Good  Judgement:  Good  Insight:  Good  Psychomotor Activity:  Normal  Concentration:  Concentration: Good and Attention Span: Good  Recall:  Good  Fund of Knowledge: Good  Language: Good  Akathisia:  No  Handed:  Right  AIMS (if indicated): not done  Assets:  Communication Skills Desire for Improvement  ADL's:  Intact  Cognition: WNL  Sleep:  Fair   Screenings: GAD-7    Flowsheet Row Office Visit from 07/12/2022 in [facility]  Total GAD-7  Score 8      PHQ2-9    [place] Visit from 07/12/2022 in [facility] Visit from 04/19/2022 in [facility] Visit from 03/15/2022 in [facility] Visit from 01/22/2022 in [facility] Office Visit from 11/20/2021 in [facility]  PHQ-2 Total Score 1 0 2 4 4   PHQ-9 Total Score 7 0 12 15 10       [place] Office Visit from 07/12/2022 in [facility] ED to Hosp-Admission (Discharged) from 07/04/2022 in [facility] Office Visit from 04/19/2022 in [facility]  C-SSRS RISK CATEGORY No Risk No Risk No Risk        Assessment and Plan:  Suzanne Glenn is a 71 y.o. 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    2. MDD (major depressive disorder), recurrent, mild ([place]) 3. GAD (generalized anxiety disorder) 4. Insomnia, unspecified type Although she reports significant worsening in an anxiety in relation to shortness of breath/COPD exacerbation, eighth has been improving since discharge from the hospital.  Psychosocial stressors includes her medical condition of COPD on oxygen,  loss of her daughter from diabetes several years ago, and conflict with one of her sisters.  She enjoys taking care of her grandchildren.  She would like to try tapering down gabapentin as she does not see any benefit from this medication.  Will lower the dose.  Will continue venlafaxine, mirtazapine for depression and anxiety.  Discussed potential risk of hypertension; she  has an upcoming appointment with PCP next week to monitor this.   1. Restless leg syndrome Unchanged. She asks to try lowering dose of gabapentin as above. She is reportedly taking iron pills.  Will check ferritin and treat accordingly.   # Tobacco use She is at contemplative stage for tobacco use.  She not interested in pharmacological treatment.  Will continue motivational interview.     Plan Continue venlafaxine 225 mg daily Continue mirtazapine 7.5 mg at night  Decrease gabapentin 300 mg three times a day (facial numbness from 600 mg TID) Next appointment-  11/21 at 11 AM, in person - on metoprolol, sinemet for restless leg  [facility]   Past trials of medication: trazodone, doxepin, lunesta, Ambien (sleepwalking), temazepam   This clinician has discussed the side effect associated with medication prescribed during this encounter. Please refer to notes in the previous encounters for more details.     The patient demonstrates the following risk factors for suicide: Chronic risk factors for suicide include: psychiatric disorder of depression . Acute risk  factors for suicide include: family or marital conflict, unemployment, and loss (financial, interpersonal, professional). Protective factors for this patient include: positive social support. Considering these factors, the overall suicide risk at this point appears to be low. Patient is appropriate for outpatient follow up.             Collaboration of Care: Collaboration of Care: Other reviewed notes in Epic  Patient/Guardian was advised Release of Information must be obtained prior to any record release in order to collaborate their care with an outside provider. Patient/Guardian was advised if they have not already done so to contact the registration department to sign all necessary forms in order for [place] to release information regarding their care.   Consent: Patient/Guardian gives verbal consent for treatment and  assignment of benefits for services provided during this visit. Patient/Guardian expressed understanding and agreed to proceed.    Norman Clay, MD 07/12/2022, 11:31 AM

## 2022-07-11 ENCOUNTER — Ambulatory Visit: Admission: RE | Admit: 2022-07-11 | Payer: Medicare HMO | Source: Home / Self Care | Admitting: Internal Medicine

## 2022-07-11 ENCOUNTER — Encounter: Admission: RE | Payer: Self-pay | Source: Home / Self Care

## 2022-07-11 SURGERY — COLONOSCOPY
Anesthesia: General

## 2022-07-12 ENCOUNTER — Ambulatory Visit: Payer: Medicare HMO | Admitting: Psychiatry

## 2022-07-12 ENCOUNTER — Encounter: Payer: Self-pay | Admitting: Psychiatry

## 2022-07-12 VITALS — BP 153/69 | HR 94 | Temp 97.8°F | Ht 63.0 in | Wt 191.0 lb

## 2022-07-12 DIAGNOSIS — G2581 Restless legs syndrome: Secondary | ICD-10-CM

## 2022-07-12 DIAGNOSIS — F3341 Major depressive disorder, recurrent, in partial remission: Secondary | ICD-10-CM | POA: Diagnosis not present

## 2022-07-12 DIAGNOSIS — G4700 Insomnia, unspecified: Secondary | ICD-10-CM

## 2022-07-12 DIAGNOSIS — F411 Generalized anxiety disorder: Secondary | ICD-10-CM | POA: Diagnosis not present

## 2022-07-12 MED ORDER — GABAPENTIN 300 MG PO CAPS: 300.0000 mg | ORAL_CAPSULE | Freq: Three times a day (TID) | ORAL | 1 refills | Status: DC

## 2022-07-12 NOTE — Patient Instructions (Signed)
Continue venlafaxine 225 mg daily Continue mirtazapine 7.5 mg at night  Decrease gabapentin 300 mg three times a day  Next appointment-  11/21 at 11 AM

## 2022-07-16 DIAGNOSIS — Z9981 Dependence on supplemental oxygen: Secondary | ICD-10-CM | POA: Diagnosis not present

## 2022-07-16 DIAGNOSIS — J449 Chronic obstructive pulmonary disease, unspecified: Secondary | ICD-10-CM | POA: Diagnosis not present

## 2022-07-16 DIAGNOSIS — Z85118 Personal history of other malignant neoplasm of bronchus and lung: Secondary | ICD-10-CM | POA: Diagnosis not present

## 2022-07-16 DIAGNOSIS — J301 Allergic rhinitis due to pollen: Secondary | ICD-10-CM | POA: Diagnosis not present

## 2022-07-16 DIAGNOSIS — F17218 Nicotine dependence, cigarettes, with other nicotine-induced disorders: Secondary | ICD-10-CM | POA: Diagnosis not present

## 2022-07-30 ENCOUNTER — Emergency Department: Payer: Medicare HMO

## 2022-07-30 ENCOUNTER — Inpatient Hospital Stay
Admission: EM | Admit: 2022-07-30 | Discharge: 2022-08-05 | DRG: 871 | Disposition: A | Discharge status: Home / Self Care | Payer: Medicare HMO | Attending: Internal Medicine | Admitting: Internal Medicine

## 2022-07-30 ENCOUNTER — Other Ambulatory Visit: Payer: Self-pay

## 2022-07-30 DIAGNOSIS — C3492 Malignant neoplasm of unspecified part of left bronchus or lung: Secondary | ICD-10-CM | POA: Diagnosis present

## 2022-07-30 DIAGNOSIS — Z825 Family history of asthma and other chronic lower respiratory diseases: Secondary | ICD-10-CM

## 2022-07-30 DIAGNOSIS — Z83438 Family history of other disorder of lipoprotein metabolism and other lipidemia: Secondary | ICD-10-CM

## 2022-07-30 DIAGNOSIS — E860 Dehydration: Secondary | ICD-10-CM | POA: Diagnosis present

## 2022-07-30 DIAGNOSIS — Z7982 Long term (current) use of aspirin: Secondary | ICD-10-CM

## 2022-07-30 DIAGNOSIS — Z9981 Dependence on supplemental oxygen: Secondary | ICD-10-CM | POA: Diagnosis not present

## 2022-07-30 DIAGNOSIS — E785 Hyperlipidemia, unspecified: Secondary | ICD-10-CM | POA: Diagnosis present

## 2022-07-30 DIAGNOSIS — Z8249 Family history of ischemic heart disease and other diseases of the circulatory system: Secondary | ICD-10-CM

## 2022-07-30 DIAGNOSIS — J168 Pneumonia due to other specified infectious organisms: Secondary | ICD-10-CM | POA: Diagnosis not present

## 2022-07-30 DIAGNOSIS — A419 Sepsis, unspecified organism: Principal | ICD-10-CM | POA: Diagnosis present

## 2022-07-30 DIAGNOSIS — R0902 Hypoxemia: Secondary | ICD-10-CM | POA: Diagnosis not present

## 2022-07-30 DIAGNOSIS — J180 Bronchopneumonia, unspecified organism: Secondary | ICD-10-CM | POA: Diagnosis not present

## 2022-07-30 DIAGNOSIS — J440 Chronic obstructive pulmonary disease with acute lower respiratory infection: Secondary | ICD-10-CM | POA: Diagnosis not present

## 2022-07-30 DIAGNOSIS — I10 Essential (primary) hypertension: Secondary | ICD-10-CM

## 2022-07-30 DIAGNOSIS — G20A1 Parkinson's disease without dyskinesia, without mention of fluctuations: Secondary | ICD-10-CM | POA: Diagnosis present

## 2022-07-30 DIAGNOSIS — F32A Depression, unspecified: Secondary | ICD-10-CM | POA: Diagnosis present

## 2022-07-30 DIAGNOSIS — Z955 Presence of coronary angioplasty implant and graft: Secondary | ICD-10-CM

## 2022-07-30 DIAGNOSIS — J9602 Acute respiratory failure with hypercapnia: Secondary | ICD-10-CM | POA: Diagnosis not present

## 2022-07-30 DIAGNOSIS — F17210 Nicotine dependence, cigarettes, uncomplicated: Secondary | ICD-10-CM | POA: Diagnosis present

## 2022-07-30 DIAGNOSIS — R000 Tachycardia, unspecified: Secondary | ICD-10-CM | POA: Diagnosis not present

## 2022-07-30 DIAGNOSIS — I2510 Atherosclerotic heart disease of native coronary artery without angina pectoris: Secondary | ICD-10-CM | POA: Diagnosis present

## 2022-07-30 DIAGNOSIS — J441 Chronic obstructive pulmonary disease with (acute) exacerbation: Secondary | ICD-10-CM | POA: Diagnosis not present

## 2022-07-30 DIAGNOSIS — F419 Anxiety disorder, unspecified: Secondary | ICD-10-CM | POA: Diagnosis present

## 2022-07-30 DIAGNOSIS — J189 Pneumonia, unspecified organism: Secondary | ICD-10-CM

## 2022-07-30 DIAGNOSIS — K219 Gastro-esophageal reflux disease without esophagitis: Secondary | ICD-10-CM | POA: Diagnosis present

## 2022-07-30 DIAGNOSIS — Z1152 Encounter for screening for COVID-19: Secondary | ICD-10-CM | POA: Diagnosis not present

## 2022-07-30 DIAGNOSIS — Z886 Allergy status to analgesic agent status: Secondary | ICD-10-CM

## 2022-07-30 DIAGNOSIS — E1165 Type 2 diabetes mellitus with hyperglycemia: Secondary | ICD-10-CM | POA: Diagnosis present

## 2022-07-30 DIAGNOSIS — J9601 Acute respiratory failure with hypoxia: Secondary | ICD-10-CM | POA: Diagnosis present

## 2022-07-30 DIAGNOSIS — I110 Hypertensive heart disease with heart failure: Secondary | ICD-10-CM | POA: Diagnosis present

## 2022-07-30 DIAGNOSIS — I959 Hypotension, unspecified: Secondary | ICD-10-CM | POA: Diagnosis not present

## 2022-07-30 DIAGNOSIS — J181 Lobar pneumonia, unspecified organism: Secondary | ICD-10-CM

## 2022-07-30 DIAGNOSIS — E669 Obesity, unspecified: Secondary | ICD-10-CM | POA: Diagnosis not present

## 2022-07-30 DIAGNOSIS — Z7984 Long term (current) use of oral hypoglycemic drugs: Secondary | ICD-10-CM

## 2022-07-30 DIAGNOSIS — Z79899 Other long term (current) drug therapy: Secondary | ICD-10-CM | POA: Diagnosis not present

## 2022-07-30 DIAGNOSIS — Z981 Arthrodesis status: Secondary | ICD-10-CM

## 2022-07-30 DIAGNOSIS — Z888 Allergy status to other drugs, medicaments and biological substances status: Secondary | ICD-10-CM

## 2022-07-30 DIAGNOSIS — E1169 Type 2 diabetes mellitus with other specified complication: Secondary | ICD-10-CM

## 2022-07-30 DIAGNOSIS — Z885 Allergy status to narcotic agent status: Secondary | ICD-10-CM

## 2022-07-30 DIAGNOSIS — Z6834 Body mass index (BMI) 34.0-34.9, adult: Secondary | ICD-10-CM

## 2022-07-30 DIAGNOSIS — Z818 Family history of other mental and behavioral disorders: Secondary | ICD-10-CM

## 2022-07-30 DIAGNOSIS — I5023 Acute on chronic systolic (congestive) heart failure: Secondary | ICD-10-CM | POA: Diagnosis not present

## 2022-07-30 DIAGNOSIS — Z66 Do not resuscitate: Secondary | ICD-10-CM | POA: Diagnosis not present

## 2022-07-30 LAB — URINALYSIS, ROUTINE W REFLEX MICROSCOPIC
Bacteria, UA: NONE SEEN
Bilirubin Urine: NEGATIVE
Glucose, UA: NEGATIVE mg/dL
Ketones, ur: 5 mg/dL — AB
Leukocytes,Ua: NEGATIVE
Nitrite: NEGATIVE
Protein, ur: 100 mg/dL — AB
Specific Gravity, Urine: 1.021 (ref 1.005–1.030)
pH: 5 (ref 5.0–8.0)

## 2022-07-30 LAB — CBC WITH DIFFERENTIAL/PLATELET
Abs Immature Granulocytes: 0.08 10*3/uL — ABNORMAL HIGH (ref 0.00–0.07)
Basophils Absolute: 0.1 10*3/uL (ref 0.0–0.1)
Basophils Relative: 1 %
Eosinophils Absolute: 0 10*3/uL (ref 0.0–0.5)
Eosinophils Relative: 0 %
HCT: 39.7 % (ref 36.0–46.0)
Hemoglobin: 11.2 g/dL — ABNORMAL LOW (ref 12.0–15.0)
Immature Granulocytes: 1 %
Lymphocytes Relative: 7 %
Lymphs Abs: 0.8 10*3/uL (ref 0.7–4.0)
MCH: 28.9 pg (ref 26.0–34.0)
MCHC: 28.2 g/dL — ABNORMAL LOW (ref 30.0–36.0)
MCV: 102.3 fL — ABNORMAL HIGH (ref 80.0–100.0)
Monocytes Absolute: 1.4 10*3/uL — ABNORMAL HIGH (ref 0.1–1.0)
Monocytes Relative: 13 %
Neutro Abs: 8.3 10*3/uL — ABNORMAL HIGH (ref 1.7–7.7)
Neutrophils Relative %: 78 %
Platelets: 278 10*3/uL (ref 150–400)
RBC: 3.88 MIL/uL (ref 3.87–5.11)
RDW: 13.4 % (ref 11.5–15.5)
Smear Review: NORMAL
WBC Morphology: INCREASED
WBC: 10.7 10*3/uL — ABNORMAL HIGH (ref 4.0–10.5)
nRBC: 0 % (ref 0.0–0.2)

## 2022-07-30 LAB — EXPECTORATED SPUTUM ASSESSMENT W GRAM STAIN, RFLX TO RESP C

## 2022-07-30 LAB — STREP PNEUMONIAE URINARY ANTIGEN: Strep Pneumo Urinary Antigen: NEGATIVE

## 2022-07-30 LAB — RESP PANEL BY RT-PCR (FLU A&B, COVID) ARPGX2
Influenza A by PCR: NEGATIVE
Influenza B by PCR: NEGATIVE
SARS Coronavirus 2 by RT PCR: NEGATIVE

## 2022-07-30 LAB — PROCALCITONIN: Procalcitonin: <0.1 ng/mL

## 2022-07-30 LAB — COMPREHENSIVE METABOLIC PANEL
ALT: 13 U/L (ref 0–44)
AST: 15 U/L (ref 15–41)
Albumin: 3.5 g/dL (ref 3.5–5.0)
Alkaline Phosphatase: 86 U/L (ref 38–126)
Anion gap: 9 (ref 5–15)
BUN: 19 mg/dL (ref 8–23)
CO2: 40 mmol/L — ABNORMAL HIGH (ref 22–32)
Calcium: 9.4 mg/dL (ref 8.9–10.3)
Chloride: 92 mmol/L — ABNORMAL LOW (ref 98–111)
Creatinine, Ser: 0.69 mg/dL (ref 0.44–1.00)
GFR, Estimated: >60 mL/min (ref >60)
Glucose, Bld: 235 mg/dL — ABNORMAL HIGH (ref 70–99)
Potassium: 4.2 mmol/L (ref 3.5–5.1)
Sodium: 141 mmol/L (ref 135–145)
Total Bilirubin: 0.5 mg/dL (ref 0.3–1.2)
Total Protein: 7.7 g/dL (ref 6.5–8.1)

## 2022-07-30 LAB — RESPIRATORY PANEL BY PCR

## 2022-07-30 LAB — BLOOD GAS, VENOUS
Acid-Base Excess: 12 mmol/L — ABNORMAL HIGH (ref 0.0–2.0)
Bicarbonate: 44.8 mmol/L — ABNORMAL HIGH (ref 20.0–28.0)
O2 Saturation: 88.1 %
Patient temperature: 37
pCO2, Ven: 112 mmHg (ref 44–60)
pH, Ven: 7.21 — ABNORMAL LOW (ref 7.25–7.43)
pO2, Ven: 62 mmHg — ABNORMAL HIGH (ref 32–45)

## 2022-07-30 LAB — LACTIC ACID, PLASMA
Lactic Acid, Venous: 1.3 mmol/L (ref 0.5–1.9)
Lactic Acid, Venous: 1 mmol/L (ref 0.5–1.9)

## 2022-07-30 LAB — BRAIN NATRIURETIC PEPTIDE: B Natriuretic Peptide: 596.4 pg/mL — ABNORMAL HIGH (ref 0.0–100.0)

## 2022-07-30 LAB — TROPONIN I (HIGH SENSITIVITY)
Troponin I (High Sensitivity): 19 ng/L — ABNORMAL HIGH (ref <18)
Troponin I (High Sensitivity): 22 ng/L — ABNORMAL HIGH (ref <18)

## 2022-07-30 LAB — PROTIME-INR
INR: 1.1 (ref 0.8–1.2)
Prothrombin Time: 14.4 seconds (ref 11.4–15.2)

## 2022-07-30 LAB — GLUCOSE, CAPILLARY
Glucose-Capillary: 174 mg/dL — ABNORMAL HIGH (ref 70–99)
Glucose-Capillary: 166 mg/dL — ABNORMAL HIGH (ref 70–99)
Glucose-Capillary: 173 mg/dL — ABNORMAL HIGH (ref 70–99)

## 2022-07-30 LAB — MAGNESIUM: Magnesium: 2.4 mg/dL (ref 1.7–2.4)

## 2022-07-30 LAB — MRSA NEXT GEN BY PCR, NASAL: MRSA by PCR Next Gen: NOT DETECTED

## 2022-07-30 MED ORDER — VENLAFAXINE HCL ER 75 MG PO CP24: 75.0000 mg | ORAL_CAPSULE | Freq: Every day | ORAL | Status: DC

## 2022-07-30 MED ORDER — MAGNESIUM SULFATE 2 GM/50ML IV SOLN
2.0000 g | Freq: Once | INTRAVENOUS | Status: DC
  Filled 2022-07-30: qty 50

## 2022-07-30 MED ORDER — VENLAFAXINE HCL ER 75 MG PO CP24
225.0000 mg | ORAL_CAPSULE | Freq: Every day | ORAL | Status: DC
  Administered 2022-07-30 – 2022-08-05 (×7): 225 mg via ORAL
  Filled 2022-07-30 (×8): qty 3

## 2022-07-30 MED ORDER — IPRATROPIUM-ALBUTEROL 0.5-2.5 (3) MG/3ML IN SOLN
3.0000 mL | Freq: Four times a day (QID) | RESPIRATORY_TRACT | Status: DC
  Administered 2022-07-30 – 2022-08-05 (×25): 3 mL via RESPIRATORY_TRACT
  Filled 2022-07-30 (×25): qty 3

## 2022-07-30 MED ORDER — IPRATROPIUM-ALBUTEROL 0.5-2.5 (3) MG/3ML IN SOLN
RESPIRATORY_TRACT | Status: AC
  Administered 2022-07-30: 6 mL via RESPIRATORY_TRACT
  Filled 2022-07-30: qty 6

## 2022-07-30 MED ORDER — METOPROLOL TARTRATE 25 MG PO TABS
12.5000 mg | ORAL_TABLET | Freq: Two times a day (BID) | ORAL | Status: DC
  Administered 2022-07-30 – 2022-08-05 (×12): 12.5 mg via ORAL
  Filled 2022-07-30 (×12): qty 1

## 2022-07-30 MED ORDER — VANCOMYCIN HCL 1750 MG/350ML IV SOLN
1750.0000 mg | Freq: Once | INTRAVENOUS | Status: AC
  Administered 2022-07-30: 1750 mg via INTRAVENOUS
  Filled 2022-07-30: qty 350

## 2022-07-30 MED ORDER — BUDESONIDE 0.25 MG/2ML IN SUSP
0.2500 mg | Freq: Two times a day (BID) | RESPIRATORY_TRACT | Status: DC
  Administered 2022-07-30 – 2022-08-05 (×13): 0.25 mg via RESPIRATORY_TRACT
  Filled 2022-07-30 (×13): qty 2

## 2022-07-30 MED ORDER — IPRATROPIUM-ALBUTEROL 0.5-2.5 (3) MG/3ML IN SOLN: 6.0000 mL | Freq: Once | RESPIRATORY_TRACT | Status: AC

## 2022-07-30 MED ORDER — METOPROLOL TARTRATE 25 MG PO TABS: 25.0000 mg | ORAL_TABLET | Freq: Two times a day (BID) | ORAL | Status: DC

## 2022-07-30 MED ORDER — SODIUM CHLORIDE 0.9 % IV SOLN
100.0000 mg | Freq: Two times a day (BID) | INTRAVENOUS | Status: DC
  Administered 2022-07-30 – 2022-07-31 (×3): 100 mg via INTRAVENOUS
  Filled 2022-07-30 (×4): qty 100

## 2022-07-30 MED ORDER — ENOXAPARIN SODIUM 60 MG/0.6ML IJ SOSY
0.5000 mg/kg | PREFILLED_SYRINGE | INTRAMUSCULAR | Status: DC
  Administered 2022-07-30 – 2022-08-04 (×6): 45 mg via SUBCUTANEOUS
  Filled 2022-07-30 (×6): qty 0.6

## 2022-07-30 MED ORDER — MORPHINE SULFATE (PF) 2 MG/ML IV SOLN
2.0000 mg | INTRAVENOUS | Status: DC | PRN
  Administered 2022-07-30 (×2): 2 mg via INTRAVENOUS
  Filled 2022-07-30 (×2): qty 1

## 2022-07-30 MED ORDER — ACETAMINOPHEN 650 MG RE SUPP: 650.0000 mg | Freq: Four times a day (QID) | RECTAL | Status: DC | PRN

## 2022-07-30 MED ORDER — ISOSORBIDE MONONITRATE ER 30 MG PO TB24
30.0000 mg | ORAL_TABLET | Freq: Every day | ORAL | Status: DC
  Administered 2022-07-30 – 2022-08-05 (×7): 30 mg via ORAL
  Filled 2022-07-30 (×7): qty 1

## 2022-07-30 MED ORDER — ACETAMINOPHEN 325 MG PO TABS: 650.0000 mg | ORAL_TABLET | Freq: Four times a day (QID) | ORAL | Status: DC | PRN

## 2022-07-30 MED ORDER — DEXMEDETOMIDINE HCL IN NACL 400 MCG/100ML IV SOLN
0.4000 ug/kg/h | INTRAVENOUS | Status: DC
  Administered 2022-07-30: 0.7 ug/kg/h via INTRAVENOUS
  Administered 2022-07-30 – 2022-07-31 (×2): 0.4 ug/kg/h via INTRAVENOUS
  Filled 2022-07-30 (×3): qty 100

## 2022-07-30 MED ORDER — PANTOPRAZOLE SODIUM 40 MG PO TBEC
40.0000 mg | DELAYED_RELEASE_TABLET | Freq: Every day | ORAL | Status: DC
  Administered 2022-07-30 – 2022-08-05 (×7): 40 mg via ORAL
  Filled 2022-07-30 (×7): qty 1

## 2022-07-30 MED ORDER — SPIRONOLACTONE 25 MG PO TABS
25.0000 mg | ORAL_TABLET | Freq: Every day | ORAL | Status: DC
  Administered 2022-07-30 – 2022-08-05 (×7): 25 mg via ORAL
  Filled 2022-07-30 (×7): qty 1

## 2022-07-30 MED ORDER — IPRATROPIUM-ALBUTEROL 0.5-2.5 (3) MG/3ML IN SOLN
3.0000 mL | Freq: Once | RESPIRATORY_TRACT | Status: AC
  Administered 2022-07-30: 3 mL via RESPIRATORY_TRACT
  Filled 2022-07-30: qty 3

## 2022-07-30 MED ORDER — INSULIN ASPART 100 UNIT/ML IJ SOLN
0.0000 [IU] | Freq: Three times a day (TID) | INTRAMUSCULAR | Status: DC
  Administered 2022-07-30 (×2): 2 [IU] via SUBCUTANEOUS
  Administered 2022-07-31: 3 [IU] via SUBCUTANEOUS
  Administered 2022-07-31 – 2022-08-01 (×3): 1 [IU] via SUBCUTANEOUS
  Administered 2022-08-01: 2 [IU] via SUBCUTANEOUS
  Administered 2022-08-01: 3 [IU] via SUBCUTANEOUS
  Administered 2022-08-02 (×2): 2 [IU] via SUBCUTANEOUS
  Administered 2022-08-02: 7 [IU] via SUBCUTANEOUS
  Administered 2022-08-03: 3 [IU] via SUBCUTANEOUS
  Administered 2022-08-03: 2 [IU] via SUBCUTANEOUS
  Administered 2022-08-03: 5 [IU] via SUBCUTANEOUS
  Administered 2022-08-04: 3 [IU] via SUBCUTANEOUS
  Administered 2022-08-04: 5 [IU] via SUBCUTANEOUS
  Administered 2022-08-04: 2 [IU] via SUBCUTANEOUS
  Administered 2022-08-05 (×2): 1 [IU] via SUBCUTANEOUS
  Filled 2022-07-30 (×19): qty 1

## 2022-07-30 MED ORDER — ASPIRIN 81 MG PO CHEW
81.0000 mg | CHEWABLE_TABLET | Freq: Every day | ORAL | Status: DC
  Administered 2022-07-30 – 2022-08-05 (×7): 81 mg via ORAL
  Filled 2022-07-30 (×7): qty 1

## 2022-07-30 MED ORDER — METHYLPREDNISOLONE SODIUM SUCC 40 MG IJ SOLR
40.0000 mg | Freq: Every day | INTRAMUSCULAR | Status: DC
  Administered 2022-07-30 – 2022-08-01 (×3): 40 mg via INTRAVENOUS
  Filled 2022-07-30 (×3): qty 1

## 2022-07-30 MED ORDER — SODIUM CHLORIDE 0.9 % IV SOLN
2.0000 g | INTRAVENOUS | Status: AC
  Administered 2022-07-30 – 2022-08-03 (×5): 2 g via INTRAVENOUS
  Filled 2022-07-30 (×3): qty 2
  Filled 2022-07-30 (×3): qty 20

## 2022-07-30 MED ORDER — PRAVASTATIN SODIUM 20 MG PO TABS
80.0000 mg | ORAL_TABLET | Freq: Every evening | ORAL | Status: DC
  Administered 2022-07-30 – 2022-08-04 (×6): 80 mg via ORAL
  Filled 2022-07-30 (×2): qty 2
  Filled 2022-07-30 (×3): qty 4
  Filled 2022-07-30: qty 2

## 2022-07-30 MED ORDER — MIRTAZAPINE 15 MG PO TABS
7.5000 mg | ORAL_TABLET | Freq: Every day | ORAL | Status: DC
  Administered 2022-07-30 – 2022-08-04 (×6): 7.5 mg via ORAL
  Filled 2022-07-30 (×6): qty 1

## 2022-07-30 MED ORDER — SODIUM CHLORIDE 0.9 % IV SOLN
2.0000 g | Freq: Once | INTRAVENOUS | Status: AC
  Administered 2022-07-30: 2 g via INTRAVENOUS
  Filled 2022-07-30: qty 12.5

## 2022-07-30 MED ORDER — FUROSEMIDE 10 MG/ML IJ SOLN
40.0000 mg | Freq: Once | INTRAMUSCULAR | Status: AC
  Administered 2022-07-30: 40 mg via INTRAVENOUS
  Filled 2022-07-30: qty 4

## 2022-07-30 MED ORDER — CARBIDOPA-LEVODOPA 25-100 MG PO TABS
1.0000 | ORAL_TABLET | Freq: Three times a day (TID) | ORAL | Status: DC
  Administered 2022-07-30 – 2022-08-05 (×19): 1 via ORAL
  Filled 2022-07-30 (×21): qty 1

## 2022-07-30 MED ORDER — METHYLPREDNISOLONE SODIUM SUCC 40 MG IJ SOLR
40.0000 mg | Freq: Every day | INTRAMUSCULAR | Status: DC
  Filled 2022-07-30: qty 1

## 2022-07-30 MED ORDER — INSULIN ASPART 100 UNIT/ML IJ SOLN
0.0000 [IU] | Freq: Every day | INTRAMUSCULAR | Status: DC
  Administered 2022-08-03: 2 [IU] via SUBCUTANEOUS
  Filled 2022-07-30: qty 1

## 2022-07-30 MED ORDER — CHLORHEXIDINE GLUCONATE CLOTH 2 % EX PADS
6.0000 | MEDICATED_PAD | Freq: Every day | CUTANEOUS | Status: DC
  Administered 2022-07-30 – 2022-08-04 (×5): 6 via TOPICAL

## 2022-07-30 NOTE — ED Notes (Signed)
Purwic placed on pt.

## 2022-07-30 NOTE — Progress Notes (Signed)
RT transported from the ED to ICU room 2 without complications.

## 2022-07-30 NOTE — Assessment & Plan Note (Signed)
Patient will be admitted to the critical care unit.  PCO2 still elevated despite BiPAP.  Patient mentating currently.  We will get critical care consultation.  Continue continuous BiPAP

## 2022-07-30 NOTE — ED Provider Notes (Addendum)
[facility] Provider Note    Event Date/Time   First MD Initiated Contact with Patient 07/30/22 [phone]     (approximate)   History   Respiratory Distress   HPI  Suzanne Glenn is a 71 y.o. female   Past medical history of squamous cell carcinoma of the left lung, COPD, smoker, diabetes, anxiety and neuropathy who presents to the emergency department with shortness of breath, productive cough over the last several days to 1 week.  No reported fever.  No chest pain.  No known sick contacts.  Arrives on CPAP with EMS, tachypneic 27, tachycardic 115, hypertensive 160s over 60 and afebrile.  EMS gave 125 of Solu-Medrol, magnesium, 2 DuoNebs and placed her on CPAP.  She reports feeling slightly better already.  History was obtained via the patient and EMS, in addition to the discharge summary dated 07/06/2022 for which she was admitted for acute on chronic hypoxic respiratory failure due to COPD exacerbation      Physical Exam   Triage Vital Signs: ED Triage Vitals  Enc Vitals Group     BP 07/30/22 0733 (!) 164/59     Pulse Rate 07/30/22 0733 (!) 115     Resp 07/30/22 0733 (!) 27     Temp 07/30/22 0733 98.1 F (36.7 C)     Temp Source 07/30/22 0733 Axillary     SpO2 07/30/22 0727 97 %     Weight 07/30/22 0729 193 lb 12.6 oz (87.9 kg)     Height --      Head Circumference --      Peak Flow --      Pain Score 07/30/22 0728 0     Pain Loc --      Pain Edu? --      Excl. in [place]? --     Most recent vital signs: Vitals:   07/30/22 0733 07/30/22 0803  BP: (!) 164/59   Pulse: (!) 115 79  Resp: (!) 27 (!) 26  Temp: 98.1 F (36.7 C)   SpO2: 100% 97%    General: Awake and conversant, answering questions appropriately.  Speaking in short sentences. CV:  Good peripheral perfusion.  Resp:  Respiratory distress.  Tachypneic.  Rhonchi and wheezes throughout. Abd:  No distention.  Nontender    ED Results / Procedures / Treatments   Labs (all  labs ordered are listed, but only abnormal results are displayed) Labs Reviewed  COMPREHENSIVE METABOLIC PANEL - Abnormal; Notable for the following components:      Result Value   Chloride 92 (*)    CO2 40 (*)    Glucose, Bld 235 (*)    All other components within normal limits  CBC WITH DIFFERENTIAL/PLATELET - Abnormal; Notable for the following components:   WBC 10.7 (*)    Hemoglobin 11.2 (*)    MCV 102.3 (*)    MCHC 28.2 (*)    All other components within normal limits  BLOOD GAS, VENOUS - Abnormal; Notable for the following components:   pH, Ven 7.21 (*)    pCO2, Ven 112 (*)    pO2, Ven 62 (*)    Bicarbonate 44.8 (*)    Acid-Base Excess 12.0 (*)    All other components within normal limits  BLOOD GAS, VENOUS - Abnormal; Notable for the following components:   pH, Ven 7.21 (*)    pCO2, Ven 112 (*)    pO2, Ven 55 (*)    Bicarbonate 44.8 (*)  Acid-Base Excess 12.0 (*)    All other components within normal limits  TROPONIN I (HIGH SENSITIVITY) - Abnormal; Notable for the following components:   Troponin I (High Sensitivity) 19 (*)    All other components within normal limits  CULTURE, BLOOD (ROUTINE X 2)  CULTURE, BLOOD (ROUTINE X 2)  RESP PANEL BY RT-PCR (FLU A&B, COVID) ARPGX2  LACTIC ACID, PLASMA  PROTIME-INR  LACTIC ACID, PLASMA  URINALYSIS, ROUTINE W REFLEX MICROSCOPIC  BRAIN NATRIURETIC PEPTIDE     I reviewed labs and they are notable for pH 7.21 and CO2 100+  EKG  ED ECG REPORT I, Lucillie Garfinkel, the attending physician, personally viewed and interpreted this ECG.   Date: 07/30/2022  EKG Time: 0735  Rate: 115  Rhythm: LBBB, frequent PVC's noted  Intervals: LBBB (old) with frequent PVCs, trigeminy, no STEMI   RADIOLOGY I independently reviewed and interpreted Cxr and see a L lung opacity concerning for pna   PROCEDURES:  Critical Care performed: Yes, see critical care procedure note(s)  .Critical Care  Performed by: Lucillie Garfinkel, MD Authorized  by: Lucillie Garfinkel, MD   Critical care provider statement:    Critical care time (minutes):  30   Critical care was necessary to treat or prevent imminent or life-threatening deterioration of the following conditions:  Respiratory failure   Critical care was time spent personally by me on the following activities:  Development of treatment plan with patient or surrogate, evaluation of patient's response to treatment, examination of patient, ordering and performing treatments and interventions, ordering and review of laboratory studies, ordering and review of radiographic studies, pulse oximetry, re-evaluation of patient's condition and review of old charts    MEDICATIONS ORDERED IN ED: Medications  vancomycin (VANCOREADY) IVPB 1750 mg/350 mL (has no administration in time range)  ipratropium-albuterol (DUONEB) 0.5-2.5 (3) MG/3ML nebulizer solution 6 mL (6 mLs Nebulization Given 07/30/22 0733)  ceFEPIme (MAXIPIME) 2 g in sodium chloride 0.9 % 100 mL IVPB (0 g Intravenous Stopped 07/30/22 0840)  ipratropium-albuterol (DUONEB) 0.5-2.5 (3) MG/3ML nebulizer solution 3 mL (3 mLs Nebulization Given 07/30/22 0859)    Consultants:  I spoke with hospitalist re admission and regarding care plan for this patient.   IMPRESSION / MDM / ASSESSMENT AND PLAN / ED COURSE  I reviewed the triage vital signs and the nursing notes.                              Differential diagnosis includes, but is not limited to, COPD exacerbation, acute hypoxemic respiratory failure, lung infection, PE, ACS`    MDM:  Duonebs, bipap, sepsis w/u and abx vanc cefepime for potential hcap pneumonia coverage in severe copd exacerbation.   Pt w L lung opacity c/o pna and already ordered for abx.  Subjective improvement on BIPAP and pt remains alert and responsive appropriately to questions.  VBG c/w hypercapnic resp failure iso COPD exacerbation drawn prior to ED treatment; will redraw to see if improvement in gas on BIPAP prior  to determining level of care and next interventions.   VBG unchanged despite patient feeling much improved. We will give more duoneb now and increase setting on bipap; don't think intubation needed right now as patient con't mentation is good and feels better. Rpt VBG after changes as above and will contact step down for admission.    Patient's presentation is most consistent with acute presentation with potential threat to life or bodily function.  FINAL CLINICAL IMPRESSION(S) / ED DIAGNOSES   Final diagnoses:  COPD exacerbation ([place])  Acute respiratory failure with hypercapnia ([place])     Rx / DC Orders   ED Discharge Orders     None        Note:  This document was prepared using Dragon voice recognition software and may include unintentional dictation errors.    Lucillie Garfinkel, MD 07/30/22 1749    Lucillie Garfinkel, MD 07/30/22 0900

## 2022-07-30 NOTE — Assessment & Plan Note (Signed)
Start Solu-Medrol and nebulizer treatments.  Continue to monitor.

## 2022-07-30 NOTE — ED Triage Notes (Signed)
2 day history of "pnumonia" like history. No formal dx. Hx of lung cancer. This morning with increased WOB and resp distress. EMS gave 2g mag, 125 solumedrol, and albuterol. Pt eyes opening to verbal. AAOx4.

## 2022-07-30 NOTE — Consult Note (Signed)
NAME:  Suzanne Glenn, MRN:  [id number], DOB:  [birth year], LOS: 0 ADMISSION DATE:  07/30/2022, CONSULTATION DATE:  07/30/2022 REFERRING MD:  Dr. 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  Patient is now DNR/DNI.  History of Present Illness:  Suzanne Glenn is a 71 year old female with a past medical history significant for COPD requiring 3 L supplemental oxygen, right upper lobe lung cancer, CAD, type 2 diabetes, GERD, hyperlipidemia, hypertension who presented to [facility] ED on 07/30/2022 due to acute respiratory distress.  Patient reports progressive shortness of breath, productive cough and wheezing for the past few days.  She denies fever chills, chest pain, palpitations, nausea, vomiting, diarrhea, dysuria.  She denies any known sick contacts.  She was placed on CPAP with EMS and given 125 mg Solu-Medrol, magnesium, and  2 duonebs.  Of note she was recently discharged on 07/06/2022 following admission for acute on chronic hypoxic respiratory failure due to COPD exacerbation.  ED Course: Initial Vital Signs: Temperature 98.1 F orally, pulse 115, respiratory rate 27, blood pressure 164/59 Significant Labs: Bicarb 40, glucose 235, BUN 19, creatinine 0.69, high-sensitivity troponin 19, BNP 596, lactic acid 1.3, WBC 10.7 with neutrophilia and increased bands, hemoglobin 11.2 VBG: pH 7.21/PCO2 112/PO2 62/bicarb 44.8 COVID-19 and influenza PCR is negative Imaging Chest X-ray>>FINDINGS: Right lung remains clear. New/worsening pulmonary infiltrate in the left midlung consistent with bronchopneumonia. Small amount of pleural fluid on the left. Medications Administered:  Cefepime & Vancomycin x1 dose, Duonebs x2  Given her work of breathing, she was placed on BiPAP in the ED.  Hospitalist asked to admit for further work-up and treatment.  Given persistent hypercapnia on VBG's, PCCM consulted.  Please see "significant hospital events" section below for full detailed hospital course.  Pertinent  Medical History   Past Medical History:  Diagnosis Date   Anxiety    Asthma    Cancer ([place]) 12/2018   w/u for right upper lobe mass/cancer   COPD (chronic obstructive pulmonary disease) (HCC)    also, emphysema. now using o2 via np 24 hours a day   Coronary artery disease    Depression    Diabetes mellitus, type II (HCC)    GERD (gastroesophageal reflux disease)    Headache    migraines in early 20's   Heart murmur    HLD (hyperlipidemia)    Hypertension    Lung cancer (HCC)    Neuropathy    Restless leg     Micro Data:  10/16: SARS-CoV-2 and influenza PCR>> negative 10/16: Respiratory viral panel>> 10/16: Blood culture x2>> 10/16: Strep pneumo urinary antigen>> 10/16: Legionella urinary antigen>> 7/16: Sputum>>  Antimicrobials:  Cefepime x1 dose 10/16 Vancomycin x1 dose 10/16 Ceftriaxone 10/16>> Doxycycline 10/16>>   Significant Hospital Events: Including procedures, antibiotic start and stop dates in addition to other pertinent events   10/16: Placed on BiPAP in ED.  Hospitalist admitted.  PCCM consulted for persistent hypercapnia.  Pt changed code status to DNR/DNI  Interim History / Subjective:  -Patient presented to ED, was placed on BiPAP -Hospitalist were asked to admit -Despite BiPAP patient with persistent hypercapnia on VBG's, however patient is awake and alert -PCCM asked to consult due to persistent hypercapnia -On exam patient with coarse breath  sounds with diffuse expiratory wheezing, increased work of breathing -Treating for acute COPD exacerbation and suspected pneumonia ~BNP noted to be elevated at 596, review of echo from  March 2023 with LVEF 45 to 50% and grade 1 diastolic dysfunction ~we will give dose of 40 mg IV Lasix x1  -Patient again awake and alert, oriented, able to make her needs known -Had discussion regarding CODE STATUS, patient is very adamant she would not to be want to be put on the ventilator or undergo ACLS/CPR ~CODE STATUS changed to DNR/DNI.  Objective   Blood pressure (!) 161/67, pulse (!) 118, temperature 98.1 F (36.7 C), temperature source Axillary, resp. rate (!) 23, height 5\' 3"  (1.6 m), weight 84.9 kg, SpO2 91 %.    FiO2 (%):  [40 %-80 %] 40 %  No intake or output data in the 24 hours ending 07/30/22 1104 Filed Weights   07/30/22 0729 07/30/22 1045  Weight: 87.9 kg 84.9 kg    Examination: General: Acute on chronically ill-appearing obese female, sitting in bed, on BiPAP, with moderate respiratory distress HENT: Atraumatic, no swelling, neck supple, difficult to assess JVD due to body habitus and BiPAP Lungs: Coarse breath sounds with expiratory wheezing throughout, BiPAP assisted, mild tachypnea and accessory muscle use Cardiovascular: Tachycardia, regular rhythm (occasional PVCs), S1-S2, no murmurs, rubs, gallops, good peripheral circulation Abdomen: Obese, soft, nontender, nondistended, no guarding rebound tenderness, bowel sounds positive x4 Extremities: Normal bulk and tone, no deformities, no edema Neuro: Awake and alert, oriented x4, moves all extremities to command, no focal deficits, speech clear, pupils PERRLA GU: External female catheter in place  Resolved Hospital Problem list     Assessment & Plan:   Acute Hypoxic & Hypercapnic Respiratory Failure in the setting of AECOPD, suspected Left Lobar Pneumonia, and ? Acute Decompensated HFmrEF PMHx: Squamous cell carcinoma of left lung, active smoker -Supplemental O2 as needed to maintain O2 sats 88 to 92% -BiPAP, wean as tolerated (Pt is DNR/DNI) -Follow intermittent Chest X-ray & ABG as needed -Bronchodilators &  Pulmicort nebs -IV Steroids (40 mg Solumedrol daily) -ABX as above -Diuresis as BP and renal function permits ~ will give 40 mg IV Lasix x1 10/16 -Pulmonary toilet as able -Follow up with Oncology outpatient  Hypertension Chronic HFmrEF (EF 45-50% in March 2023) Mildly elevated Troponin, suspect demand ischemia Echocardiogram 12/20/21: 45-50%, grade I DD, normal RV systolic function -Continuous cardiac monitoring -Maintain MAP >65 -Vasopressors as needed to maintain MAP goal -Trend HS Troponin until peaked (19 ~ 22 ~) -Diuresis as BP and renal function permits ~ will give 40 mg Lasix x1 10/16 -Continue outpatient Spironolactone and Imdur  Sepsis due to suspected Left Lobar Pneumonia Meets SIRS Criteria on presentation: HR 115, RR 27 -Monitor fever curve -Trend WBC's & Procalcitonin -Follow cultures as above -Continue empiric Ceftriaxone and Doxycycline pending cultures & sensitivities  Type II Diabetes Mellitus -CBG's q4h; Target range of 140 to 180 -SSI -Follow ICU Hypo/Hyperglycemia protocol        Best Practice (right click and "Reselect all SmartList Selections" daily)   Diet/type: NPO DVT prophylaxis: LMWH GI prophylaxis: PPI Lines: N/A Foley:  N/A Code Status:  DNR Last date of multidisciplinary goals of care discussion [N/A]  Labs   CBC: Recent Labs  Lab 07/30/22 0739  WBC 10.7*  NEUTROABS 8.3*  HGB 11.2*  HCT 39.7  MCV 102.3*  PLT 563    Basic Metabolic Panel: Recent Labs  Lab 07/30/22 0739  NA 141  K 4.2  CL 92*  CO2 40*  GLUCOSE 235*  BUN 19  CREATININE 0.69  CALCIUM 9.4   GFR: Estimated Creatinine Clearance: 67.6 mL/min (by C-G formula based on SCr of 0.69 mg/dL). Recent Labs  Lab 07/30/22 0739 07/30/22 0740  WBC 10.7*  --   LATICACIDVEN  --  1.3    Liver Function Tests: Recent Labs  Lab 07/30/22 0739  AST 15  ALT 13  ALKPHOS 86  BILITOT 0.5  PROT 7.7  ALBUMIN 3.5   No results for input(s): "LIPASE", "AMYLASE" in  the last 168 hours. No results for input(s): "AMMONIA" in the last 168 hours.  ABG    Component Value Date/Time   HCO3 45.4 (H) 07/30/2022 0943   O2SAT 73.1 07/30/2022 0943     Coagulation Profile: Recent Labs  Lab 07/30/22 0739  INR 1.1    Cardiac Enzymes: No results for input(s): "CKTOTAL", "CKMB", "CKMBINDEX", "TROPONINI" in the last 168 hours.  HbA1C: Hgb A1c MFr Bld  Date/Time Value Ref Range Status  07/05/2022 03:21 AM 6.1 (H) 4.8 - 5.6 % Final    Comment:    (NOTE) Pre diabetes:          5.7%-6.4%  Diabetes:              >6.4%  Glycemic control for   <7.0% adults with diabetes   08/20/2021 04:33 AM 6.8 (H) 4.8 - 5.6 % Final    Comment:    (NOTE) Pre diabetes:          5.7%-6.4%  Diabetes:              >6.4%  Glycemic control for   <7.0% adults with diabetes     CBG: Recent Labs  Lab 07/30/22 1100  GLUCAP 174*    Review of Systems:   Positives in BOLD: Gen: Denies fever, chills, weight change, fatigue, night sweats HEENT: Denies blurred vision, double vision, hearing loss, tinnitus, sinus congestion, rhinorrhea, sore throat, neck stiffness, dysphagia PULM: Denies shortness of breath, cough, sputum production, hemoptysis, wheezing CV: Denies chest pain, edema, orthopnea, paroxysmal nocturnal dyspnea, palpitations GI: Denies abdominal pain, nausea, vomiting, diarrhea, hematochezia, melena, constipation, change in bowel habits GU: Denies dysuria, hematuria, polyuria, oliguria, urethral discharge Endocrine: Denies hot or cold intolerance, polyuria, polyphagia or appetite change Derm: Denies rash, dry skin, scaling or peeling skin change Heme: Denies easy bruising, bleeding, bleeding gums Neuro: Denies headache, numbness, weakness, slurred speech, loss of memory or consciousness   Past Medical History:  She,  has a past medical history of Anxiety, Asthma, Cancer ([place]) (12/2018), COPD (chronic obstructive pulmonary disease) ([place]), Coronary artery  disease, Depression, Diabetes mellitus, type II ([place]), GERD (gastroesophageal reflux disease), Headache, Heart murmur, HLD (hyperlipidemia), Hypertension, Lung cancer ([place]), Neuropathy, and Restless leg.   Surgical History:   Past Surgical History:  Procedure Laterality Date   BACK SURGERY  2005   surgery x 2, disc fused in neck, pinched nerve in center of back and neck   CARDIAC CATHETERIZATION  2015   1 stent placed for blockage   cervical bone infusion     CHOLECYSTECTOMY     DILATION AND CURETTAGE OF UTERUS     FOOT SURGERY Left    foot bone spur removed   THORACIC DISC SURGERY     TUBAL LIGATION     VIDEO BRONCHOSCOPY WITH ENDOBRONCHIAL ULTRASOUND N/A 03/06/2019   Procedure: VIDEO BRONCHOSCOPY WITH ENDOBRONCHIAL ULTRASOUND - DIABETIC;  Surgeon: Ottie Glazier, MD;  Location: [facility] ORS;  Service: Thoracic;  Laterality: N/A;     Social History:   reports that she has been smoking cigarettes. She started smoking about 47 years ago. She has a 40.00 pack-year smoking history. She has never used smokeless tobacco. She reports that she does not drink alcohol and does not use drugs.   Family History:  Her family history includes Anxiety disorder in her mother; Bipolar disorder in her sister; Breast cancer in her maternal aunt; COPD in her sister; Cancer in her brother, brother, brother, sister, sister, sister, and sister; Cancer - Lung in her mother and sister; Depression in her brother, sister, and son; Diabetes in her sister and sister; Heart Problems in her brother and sister; Hyperlipidemia in her sister; Hypertension in her sister.   Allergies Allergies  Allergen Reactions   Diclofenac-Misoprostol Other (See Comments)    Arthrotec - gastritis    Nsaids Other (See Comments)    gastritis   Zolpidem Other (See Comments)    Sleep walking    Hydrocodone-Acetaminophen Nausea And Vomiting   Simvastatin Other (See Comments)    body aches     Home Medications  Prior to Admission  medications   Medication Sig Start Date End Date Taking? Authorizing Provider  albuterol (VENTOLIN HFA) 108 (90 Base) MCG/ACT inhaler Inhale 2 puffs into the lungs every 4 (four) hours as needed for wheezing or shortness of breath.  03/22/14  Yes [provider]  amLODipine (NORVASC) 10 MG tablet Take 10 mg by mouth daily.    Yes [provider]  aspirin 81 MG chewable tablet Chew by mouth daily.   Yes [provider]  carbidopa-levodopa (SINEMET IR) 25-100 MG tablet Take 1 tablet by mouth 3 (three) times daily.   Yes [provider]  cyanocobalamin 1000 MCG tablet Take 1 tablet by mouth daily.   Yes [provider]  esomeprazole (NEXIUM) 40 MG capsule Take 40 mg by mouth daily.   Yes [provider]  ferrous sulfate 325 (65 FE) MG tablet Take 1 tablet (325 mg total) by mouth 2 (two) times daily with a meal. 01/02/19  Yes Vaughan Basta, MD  Fluticasone-Umeclidin-Vilant 100-62.5-25 MCG/INH AEPB Inhale 1 puff into the lungs daily. [place] 11/28/18  Yes [provider]  gabapentin (NEURONTIN) 300 MG capsule Take 1 capsule (300 mg total) by mouth 3 (three) times daily. 07/12/22 09/10/22 Yes Hisada, Elie Goody, MD  ipratropium-albuterol (DUONEB) 0.5-2.5 (3) MG/3ML SOLN Take 3 mLs by nebulization every 6 (six) hours. 04/17/21  Yes Sharen Hones, MD  isosorbide mononitrate (IMDUR) 30 MG 24 hr tablet Take 1 tablet by mouth daily. 01/24/22 01/24/23 Yes [provider]  metFORMIN (GLUCOPHAGE) 500 MG tablet Take 1 tablet (500 mg total) by mouth 2 (two) times daily with a meal. 08/22/21  Yes Lorella Nimrod, MD  metoprolol tartrate (LOPRESSOR) 25 MG tablet Take 25 mg by mouth 2 (two) times daily.    Yes [provider]  mirtazapine (REMERON) 7.5 MG tablet Take 1 tablet (7.5 mg total) by mouth at bedtime. 06/23/22 09/21/22 Yes Norman Clay, MD  pravastatin (PRAVACHOL) 80 MG tablet Take 80 mg by mouth every evening.    Yes [provider]  spironolactone (ALDACTONE) 25 MG tablet Take 25 mg by mouth daily.    Yes [provider]  venlafaxine XR (EFFEXOR-XR) 150 MG 24 hr capsule Take 1 capsule (150 mg total) by mouth daily. Take total of 225 mg daily, take along with 75 mg cap 03/07/22 09/03/22 Yes Hisada, Elie Goody, MD  venlafaxine XR (  EFFEXOR-XR) 75 MG 24 hr capsule Take 1 capsule (75 mg total) by mouth daily. Take total of 225 mg daily, take along with 150 mg cap 06/05/22 12/02/22 Yes Hisada, Elie Goody, MD  ACCU-CHEK FASTCLIX LANCETS [place]  02/18/15   [provider]  ACCU-CHEK SMARTVIEW test strip  02/18/15   [provider]  feeding supplement (ENSURE ENLIVE / ENSURE PLUS) LIQD Take 237 mLs by mouth 2 (two) times daily between meals. 08/22/21   Lorella Nimrod, MD  OXYGEN Inhale 2 L into the lungs continuous.    [provider]     Critical care time: 45 minutes     Darel Hong, AGACNP-BC [facility] [place] epic messenger for cross cover needs If after hours, please call E-link

## 2022-07-30 NOTE — Assessment & Plan Note (Signed)
BMI 34.33 with current height and weight in computer.

## 2022-07-30 NOTE — Assessment & Plan Note (Addendum)
We will place on sliding scale insulin and continue pravastatin.  With steroid sugars will be higher.  Recent hemoglobin A1c 6.1.

## 2022-07-30 NOTE — ED Notes (Signed)
Notified Dr. Jacelyn Grip at this time of critical pH: 7.12, Co2 112, pO262, bicarb 44.8

## 2022-07-30 NOTE — Assessment & Plan Note (Signed)
Left lobar pneumonia.  Check viral respiratory panel and COVID test.  ER physician ordered a dose of vancomycin and Maxipime will switch to doxycycline and Rocephin.

## 2022-07-30 NOTE — Assessment & Plan Note (Signed)
Continue Aldactone.  Holding metoprolol for now.

## 2022-07-30 NOTE — ED Notes (Signed)
Covid swab collected and sent to lab. Report given to RN at this time.

## 2022-07-30 NOTE — H&P (Signed)
History and Physical    Patient: Suzanne Glenn [id number]:[id number] DOB: Feb 15, 1951 DOA: 07/30/2022 DOS: the patient was seen and examined on 07/30/2022 PCP: Tracie Harrier, MD  Patient coming from: Home  Chief Complaint:  Chief Complaint  Patient presents with   Respiratory Distress   HPI: Suzanne Glenn is a 71 y.o. female with medical history significant of COPD on chronic oxygen 3 L, right upper lobe cancer, CAD, type 2 diabetes, GERD, hyperlipidemia, hypertension.  Brought into the hospital with respiratory distress.  Patient was found to have an elevated PCO2 and started on BiPAP.  Patient is answering questions and following commands.  Patient complains of some shortness of breath and some cough.  Patient was also found to have pneumonia and started on antibiotics.  Hospitalist services were contacted for further evaluation. Review of Systems: Review of Systems  Constitutional:  Positive for malaise/fatigue. Negative for chills, fever and weight loss.  HENT:  Negative for congestion and sore throat.   Eyes:  Negative for blurred vision.  Respiratory:  Positive for cough and shortness of breath.   Cardiovascular:  Negative for chest pain.  Gastrointestinal:  Negative for abdominal pain, constipation, diarrhea, nausea and vomiting.  Genitourinary:  Negative for dysuria.  Musculoskeletal:  Negative for myalgias.  Skin:  Negative for rash.  Neurological:  Negative for dizziness.  Endo/Heme/Allergies:  Does not bruise/bleed easily.  Psychiatric/Behavioral:  Positive for depression.     Past Medical History:  Diagnosis Date   Anxiety    Asthma    Cancer ([place]) 12/2018   w/u for right upper lobe mass/cancer   COPD (chronic obstructive pulmonary disease) (HCC)    also, emphysema. now using o2 via np 24 hours a day   Coronary artery disease    Depression    Diabetes mellitus, type II (HCC)    GERD (gastroesophageal reflux disease)    Headache    migraines in early 20's    Heart murmur    HLD (hyperlipidemia)    Hypertension    Lung cancer ([place])    Neuropathy    Restless leg    Past Surgical History:  Procedure Laterality Date   BACK SURGERY  2005   surgery x 2, disc fused in neck, pinched nerve in center of back and neck   CARDIAC CATHETERIZATION  2015   1 stent placed for blockage   cervical bone infusion     CHOLECYSTECTOMY     DILATION AND CURETTAGE OF UTERUS     FOOT SURGERY Left    foot bone spur removed   THORACIC DISC SURGERY     TUBAL LIGATION     VIDEO BRONCHOSCOPY WITH ENDOBRONCHIAL ULTRASOUND N/A 03/06/2019   Procedure: VIDEO BRONCHOSCOPY WITH ENDOBRONCHIAL ULTRASOUND - DIABETIC;  Surgeon: Ottie Glazier, MD;  Location: [facility] ORS;  Service: Thoracic;  Laterality: N/A;   Social History:  reports that she has been smoking cigarettes. She started smoking about 47 years ago. She has a 40.00 pack-year smoking history. She has never used smokeless tobacco. She reports that she does not drink alcohol and does not use drugs.  Allergies  Allergen Reactions   Diclofenac-Misoprostol Other (See Comments)    Arthrotec - gastritis    Nsaids Other (See Comments)    gastritis   Zolpidem Other (See Comments)    Sleep walking    Hydrocodone-Acetaminophen Nausea And Vomiting   Simvastatin Other (See Comments)    body aches    Family History  Problem Relation Age of Onset  Anxiety disorder Mother    Cancer - Lung Mother    Depression Sister    Cancer Sister    Cancer Sister    Cancer Sister    Cancer Sister    Heart Problems Sister    Bipolar disorder Sister    Diabetes Sister    Cancer - Lung Sister    Hypertension Sister    Diabetes Sister    Hyperlipidemia Sister    COPD Sister    Depression Brother    Heart Problems Brother    Cancer Brother    Cancer Brother    Cancer Brother    Breast cancer Maternal Aunt    Depression Son     Prior to Admission medications   Medication Sig Start Date End Date Taking? Authorizing Provider   ACCU-CHEK FASTCLIX LANCETS [place]  02/18/15   [provider]  ACCU-CHEK SMARTVIEW test strip  02/18/15   [provider]  albuterol (VENTOLIN HFA) 108 (90 Base) MCG/ACT inhaler Inhale 2 puffs into the lungs every 4 (four) hours as needed for wheezing or shortness of breath.  03/22/14   [provider]  amLODipine (NORVASC) 10 MG tablet Take 10 mg by mouth daily.     [provider]  aspirin 81 MG chewable tablet Chew by mouth daily.    [provider]  carbidopa-levodopa (SINEMET IR) 25-100 MG tablet Take 1 tablet by mouth 3 (three) times daily.    [provider]  cyanocobalamin 1000 MCG tablet Take 1 tablet by mouth daily.    [provider]  esomeprazole (NEXIUM) 40 MG capsule Take 40 mg by mouth daily.    [provider]  feeding supplement (ENSURE ENLIVE / ENSURE PLUS) LIQD Take 237 mLs by mouth 2 (two) times daily between meals. 08/22/21   Lorella Nimrod, MD  ferrous sulfate 325 (65 FE) MG tablet Take 1 tablet (325 mg total) by mouth 2 (two) times daily with a meal. 01/02/19   Vaughan Basta, MD  Fluticasone-Umeclidin-Vilant 100-62.5-25 MCG/INH AEPB Inhale 1 puff into the lungs daily. [place] 11/28/18   [provider]  gabapentin (NEURONTIN) 300 MG capsule Take 1 capsule (300 mg total) by mouth 3 (three) times daily. 07/12/22 09/10/22  Norman Clay, MD  ipratropium-albuterol (DUONEB) 0.5-2.5 (3) MG/3ML SOLN Take 3 mLs by nebulization every 6 (six) hours. 04/17/21   Sharen Hones, MD  isosorbide mononitrate (IMDUR) 30 MG 24 hr tablet Take 1 tablet by mouth daily. 01/24/22 01/24/23  [provider]  metFORMIN (GLUCOPHAGE) 500 MG tablet Take 1 tablet (500 mg total) by mouth 2 (two) times daily with a meal. 08/22/21   Lorella Nimrod, MD  metoprolol tartrate (LOPRESSOR) 25 MG tablet Take 25 mg by mouth 2 (two) times daily.     [provider]  mirtazapine (REMERON) 7.5 MG tablet Take 1 tablet (7.5 mg total)  by mouth at bedtime. 06/23/22 09/21/22  Norman Clay, MD  OXYGEN Inhale 2 L into the lungs continuous.    [provider]  pravastatin (PRAVACHOL) 80 MG tablet Take 80 mg by mouth every evening.     [provider]  spironolactone (ALDACTONE) 25 MG tablet Take 25 mg by mouth daily.     [provider]  venlafaxine XR (EFFEXOR-XR) 150 MG 24 hr capsule Take 1 capsule (150 mg total) by mouth daily. Take total of 225 mg daily, take along with 75 mg cap 03/07/22 09/03/22  Norman Clay, MD  venlafaxine XR (EFFEXOR-XR) 75 MG 24 hr capsule  Take 1 capsule (75 mg total) by mouth daily. Take total of 225 mg daily, take along with 150 mg cap 06/05/22 12/02/22  Norman Clay, MD    Physical Exam: Vitals:   07/30/22 0733 07/30/22 0803 07/30/22 0830 07/30/22 0900  BP: (!) 164/59  (!) 160/73 (!) 173/60  Pulse: (!) 115 79  (!) 57  Resp: (!) 27 (!) 26 19 (!) 26  Temp: 98.1 F (36.7 C)     TempSrc: Axillary     SpO2: 100% 97% 97% 99%  Weight:       Physical Exam HENT:     Head: Normocephalic.     Mouth/Throat:     Pharynx: No oropharyngeal exudate.  Eyes:     General: Lids are normal.     Conjunctiva/sclera: Conjunctivae normal.  Cardiovascular:     Rate and Rhythm: Normal rate and regular rhythm.     Heart sounds: Normal heart sounds, S1 normal and S2 normal.  Pulmonary:     Breath sounds: Examination of the right-lower field reveals decreased breath sounds. Examination of the left-lower field reveals decreased breath sounds. Decreased breath sounds present. No wheezing, rhonchi or rales.  Abdominal:     Palpations: Abdomen is soft.     Tenderness: There is no abdominal tenderness.  Musculoskeletal:     Right lower leg: No swelling.     Left lower leg: No swelling.  Skin:    General: Skin is warm.     Findings: No rash.  Neurological:     Mental Status: She is alert.     Comments: Answers some questions and follows some commands.     Data Reviewed: ABG shows a  PCO2 of 112 and pH of 7.22 CO2 on chemistry 40, BNP 596, troponin 19, white blood cell count 10.7, hemoglobin 11.2, platelet count 278  Assessment and Plan: * Acute respiratory failure with hypoxia and hypercapnia (HCC) Patient will be admitted to the critical care unit.  PCO2 still elevated despite BiPAP.  Patient mentating currently.  We will get critical care consultation.  Continue continuous BiPAP  Lobar pneumonia (HCC) Left lobar pneumonia.  Check viral respiratory panel and COVID test.  ER physician ordered a dose of vancomycin and Maxipime will switch to doxycycline and Rocephin.  COPD with acute exacerbation (HCC) Start Solu-Medrol and nebulizer treatments.  Continue to monitor.  Obesity (BMI 30-39.9) BMI 34.33 with current height and weight in computer.  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  Type 2 diabetes mellitus with hyperlipidemia (HCC) We will place on sliding scale insulin and continue pravastatin.  With steroid sugars will be higher.  Recent hemoglobin A1c 6.1.  Essential hypertension Continue Aldactone.  Holding metoprolol for now.      Advance Care Planning: Full code  Consults: Critical care team  Family Communication: Updated husband on the phone  Severity of Illness: Inpatient status needed.  Patient high risk for intubation with elevated PCO2.  Being admitted to the stepdown unit.  IV antibiotics for pneumonia and IV Solu-Medrol.  Author: Loletha Grayer, MD 07/30/2022 9:59 AM  For on call review [email or web address].

## 2022-07-30 NOTE — Assessment & Plan Note (Signed)
Follow up as outpatient.  

## 2022-07-30 NOTE — Consult Note (Signed)
PHARMACY -  BRIEF ANTIBIOTIC NOTE   Pharmacy has received consult(s) for Vancomycin from an ED provider.  The patient's profile has been reviewed for ht/wt/allergies/indication/available labs.    One time order(s) placed for Vancomycin 1750mg  x 1 dose  Further antibiotics/pharmacy consults should be ordered by admitting physician if indicated.                       Thank you, Imogene Gravelle Rodriguez-Guzman PharmD, BCPS 07/30/2022 8:45 AM

## 2022-07-30 NOTE — ED Notes (Signed)
Pt transported to ICU by RT and RN Eustaquio Maize

## 2022-07-31 LAB — BASIC METABOLIC PANEL
Anion gap: 9 (ref 5–15)
BUN: 28 mg/dL — ABNORMAL HIGH (ref 8–23)
CO2: 39 mmol/L — ABNORMAL HIGH (ref 22–32)
Calcium: 9.2 mg/dL (ref 8.9–10.3)
Chloride: 93 mmol/L — ABNORMAL LOW (ref 98–111)
Creatinine, Ser: 0.96 mg/dL (ref 0.44–1.00)
GFR, Estimated: >60 mL/min (ref >60)
Glucose, Bld: 145 mg/dL — ABNORMAL HIGH (ref 70–99)
Potassium: 4 mmol/L (ref 3.5–5.1)
Sodium: 141 mmol/L (ref 135–145)

## 2022-07-31 LAB — GLUCOSE, CAPILLARY
Glucose-Capillary: 125 mg/dL — ABNORMAL HIGH (ref 70–99)
Glucose-Capillary: 148 mg/dL — ABNORMAL HIGH (ref 70–99)
Glucose-Capillary: 203 mg/dL — ABNORMAL HIGH (ref 70–99)
Glucose-Capillary: 134 mg/dL — ABNORMAL HIGH (ref 70–99)

## 2022-07-31 LAB — CBC
HCT: 32.7 % — ABNORMAL LOW (ref 36.0–46.0)
Hemoglobin: 9.6 g/dL — ABNORMAL LOW (ref 12.0–15.0)
MCH: 29.5 pg (ref 26.0–34.0)
MCHC: 29.4 g/dL — ABNORMAL LOW (ref 30.0–36.0)
MCV: 100.6 fL — ABNORMAL HIGH (ref 80.0–100.0)
Platelets: 229 10*3/uL (ref 150–400)
RBC: 3.25 MIL/uL — ABNORMAL LOW (ref 3.87–5.11)
RDW: 13.4 % (ref 11.5–15.5)
WBC: 9.9 10*3/uL (ref 4.0–10.5)
nRBC: 0 % (ref 0.0–0.2)

## 2022-07-31 LAB — PROCALCITONIN: Procalcitonin: 0.71 ng/mL

## 2022-07-31 NOTE — Progress Notes (Signed)
Progress Note   Patient: Suzanne Glenn [id number]:[id number] DOB: 1951/06/20 DOA: 07/30/2022     1 DOS: the patient was seen and examined on 07/31/2022   Brief hospital course: Suzanne Glenn is a 71 y.o. female with medical history significant of COPD on chronic oxygen 3 L, right upper lobe cancer, CAD, type 2 diabetes, GERD, hyperlipidemia, hypertension.  Brought into the hospital with respiratory distress.  Patient was found to have an elevated PCO2 and started on BiPAP.  Patient is answering questions and following commands.  Patient complains of some shortness of breath and some cough.  Patient was also found to have pneumonia and started on antibiotics.  Hospitalist services were contacted for further evaluation.  10/17: Patient's acute hypoxia has improved currently on 3 L of oxygen by nasal cannula.  Patient weaned of BiPAP.  Patient still has significant wheezing and rhonchi.  Assessment and Plan: * Acute respiratory failure with hypoxia and hypercapnia (HCC) -Patient was admitted and initiated on BiPAP secondary to elevated PCO2. - Overnight patient has been weaned off BiPAP - Currently patient is on 3 liters via nasal cannula   Lobar pneumonia ([place]) Left lobar pneumonia.   -Viral respiratory panel , COVID, influenza a and B  are all negative  -Continue Rocephin and doxycycline  COPD with acute exacerbation (HCC) -Exacerbation is improving, still has diffuse wheezing - Continue Solu-Medrol and nebulizer treatments.   Continue to monitor.  Obesity (BMI 30-39.9) BMI 34.33 with current height and weight in computer.  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  Type 2 diabetes mellitus with hyperlipidemia (HCC) We will place on sliding scale insulin and continue pravastatin.  With steroid sugars will be higher.  Recent hemoglobin A1c 6.1.  Essential hypertension Continue Aldactone.  Holding metoprolol for now.        Subjective: Patient was  seen and examined at bedside today.  Patient reports improvement in shortness of breath.  She has been weaned off BiPAP currently is on nasal cannula 3 L  Physical Exam: Vitals:   07/31/22 0900 07/31/22 1000 07/31/22 1100 07/31/22 1200  BP: 122/70 132/72 (!) 141/87 (!) 151/98  Pulse: (!) 105 (!) 107 (!) 105 (!) 108  Resp: (!) 26 (!) 27    Temp:      TempSrc:      SpO2: 92% (!) 89% 98% 95%  Weight:      Height:       HENT:     Head: Normocephalic.     Mouth/Throat:     Pharynx: No oropharyngeal exudate.  Eyes:     General: Lids are normal.     Conjunctiva/sclera: Conjunctivae normal.  Cardiovascular:     Rate and Rhythm: Normal rate and regular rhythm.     Heart sounds: Normal heart sounds, S1 normal and S2 normal.  Pulmonary:     Breath sounds: Bilateral decreased breath sounds with mild wheezing  abdominal:     Palpations: Abdomen is soft.     Tenderness: There is no abdominal tenderness.  Musculoskeletal:     Right lower leg: No swelling.     Left lower leg: No swelling.  Skin:    General: Skin is warm.     Findings: No rash.  Neurological:     Mental Status: She is alert.     Comments: Answers some questions and follows some commands.  Data Reviewed:  CBC, CMP within normal limits, swab for influenza and COVID-negative  Family Communication: Patient is alert  and oriented  Disposition: Status is: Inpatient Remains inpatient appropriate because: Still having acute hypoxic respiratory failure secondary to pneumonia and COPD exacerbation  Planned Discharge Destination: Home    Time spent: 35 minutes  Author: Carlyle Lipa, MD 07/31/2022 1:27 PM  For on call review [email or web address].

## 2022-07-31 NOTE — Plan of Care (Signed)
Weaned off biPAP Weaned off precedex No on pressors DNR/DNI  ICU needs resolved, recommend transfer out of ICU/SD.     Will sign off at this time. No further recommendations at this time.  Please call [phone] for further questions. Thank you.    Corrin Parker, M.D.  [facility]  Medical Director [place] Director [facility] Cardio-Pulmonary Department

## 2022-08-01 DIAGNOSIS — J9601 Acute respiratory failure with hypoxia: Secondary | ICD-10-CM | POA: Diagnosis not present

## 2022-08-01 DIAGNOSIS — J9602 Acute respiratory failure with hypercapnia: Secondary | ICD-10-CM | POA: Diagnosis not present

## 2022-08-01 LAB — BLOOD GAS, VENOUS
Acid-Base Excess: 12 mmol/L — ABNORMAL HIGH (ref 0.0–2.0)
Acid-Base Excess: 12.7 mmol/L — ABNORMAL HIGH (ref 0.0–2.0)
Bicarbonate: 44.8 mmol/L — ABNORMAL HIGH (ref 20.0–28.0)
Bicarbonate: 45.4 mmol/L — ABNORMAL HIGH (ref 20.0–28.0)
O2 Saturation: 84.8 %
O2 Saturation: 73.1 %
Patient temperature: 37
Patient temperature: 37
pCO2, Ven: 112 mmHg (ref 44–60)
pCO2, Ven: 111 mmHg (ref 44–60)
pH, Ven: 7.21 — ABNORMAL LOW (ref 7.25–7.43)
pH, Ven: 7.22 — ABNORMAL LOW (ref 7.25–7.43)
pO2, Ven: 55 mmHg — ABNORMAL HIGH (ref 32–45)
pO2, Ven: 46 mmHg — ABNORMAL HIGH (ref 32–45)

## 2022-08-01 LAB — CBC
HCT: 34.8 % — ABNORMAL LOW (ref 36.0–46.0)
Hemoglobin: 10.4 g/dL — ABNORMAL LOW (ref 12.0–15.0)
MCH: 29.5 pg (ref 26.0–34.0)
MCHC: 29.9 g/dL — ABNORMAL LOW (ref 30.0–36.0)
MCV: 98.6 fL (ref 80.0–100.0)
Platelets: 315 10*3/uL (ref 150–400)
RBC: 3.53 MIL/uL — ABNORMAL LOW (ref 3.87–5.11)
RDW: 13.6 % (ref 11.5–15.5)
WBC: 13 10*3/uL — ABNORMAL HIGH (ref 4.0–10.5)
nRBC: 0.4 % — ABNORMAL HIGH (ref 0.0–0.2)

## 2022-08-01 LAB — COMPREHENSIVE METABOLIC PANEL
ALT: <5 U/L (ref 0–44)
AST: 13 U/L — ABNORMAL LOW (ref 15–41)
Albumin: 2.8 g/dL — ABNORMAL LOW (ref 3.5–5.0)
Alkaline Phosphatase: 67 U/L (ref 38–126)
Anion gap: 8 (ref 5–15)
BUN: 30 mg/dL — ABNORMAL HIGH (ref 8–23)
CO2: 42 mmol/L — ABNORMAL HIGH (ref 22–32)
Calcium: 9.1 mg/dL (ref 8.9–10.3)
Chloride: 93 mmol/L — ABNORMAL LOW (ref 98–111)
Creatinine, Ser: 0.69 mg/dL (ref 0.44–1.00)
GFR, Estimated: >60 mL/min (ref >60)
Glucose, Bld: 127 mg/dL — ABNORMAL HIGH (ref 70–99)
Potassium: 3.8 mmol/L (ref 3.5–5.1)
Sodium: 143 mmol/L (ref 135–145)
Total Bilirubin: 0.3 mg/dL (ref 0.3–1.2)
Total Protein: 6.3 g/dL — ABNORMAL LOW (ref 6.5–8.1)

## 2022-08-01 LAB — CBC WITH DIFFERENTIAL/PLATELET
Abs Immature Granulocytes: 0.16 10*3/uL — ABNORMAL HIGH (ref 0.00–0.07)
Basophils Absolute: 0.1 10*3/uL (ref 0.0–0.1)
Basophils Relative: 1 %
Eosinophils Absolute: 0.1 10*3/uL (ref 0.0–0.5)
Eosinophils Relative: 1 %
HCT: 34.2 % — ABNORMAL LOW (ref 36.0–46.0)
Hemoglobin: 10.1 g/dL — ABNORMAL LOW (ref 12.0–15.0)
Immature Granulocytes: 1 %
Lymphocytes Relative: 5 %
Lymphs Abs: 0.6 10*3/uL — ABNORMAL LOW (ref 0.7–4.0)
MCH: 29.2 pg (ref 26.0–34.0)
MCHC: 29.5 g/dL — ABNORMAL LOW (ref 30.0–36.0)
MCV: 98.8 fL (ref 80.0–100.0)
Monocytes Absolute: 1.3 10*3/uL — ABNORMAL HIGH (ref 0.1–1.0)
Monocytes Relative: 11 %
Neutro Abs: 9.8 10*3/uL — ABNORMAL HIGH (ref 1.7–7.7)
Neutrophils Relative %: 81 %
Platelets: 286 10*3/uL (ref 150–400)
RBC: 3.46 MIL/uL — ABNORMAL LOW (ref 3.87–5.11)
RDW: 13.6 % (ref 11.5–15.5)
WBC: 12 10*3/uL — ABNORMAL HIGH (ref 4.0–10.5)
nRBC: 0.3 % — ABNORMAL HIGH (ref 0.0–0.2)

## 2022-08-01 LAB — GLUCOSE, CAPILLARY
Glucose-Capillary: 134 mg/dL — ABNORMAL HIGH (ref 70–99)
Glucose-Capillary: 210 mg/dL — ABNORMAL HIGH (ref 70–99)
Glucose-Capillary: 189 mg/dL — ABNORMAL HIGH (ref 70–99)
Glucose-Capillary: 145 mg/dL — ABNORMAL HIGH (ref 70–99)

## 2022-08-01 LAB — PROCALCITONIN: Procalcitonin: 0.43 ng/mL

## 2022-08-01 MED ORDER — ONDANSETRON HCL 4 MG/2ML IJ SOLN: 4.0000 mg | Freq: Four times a day (QID) | INTRAMUSCULAR | Status: DC | PRN

## 2022-08-01 MED ORDER — FUROSEMIDE 10 MG/ML IJ SOLN
40.0000 mg | Freq: Once | INTRAMUSCULAR | Status: AC
  Administered 2022-08-01: 40 mg via INTRAVENOUS
  Filled 2022-08-01: qty 4

## 2022-08-01 MED ORDER — GABAPENTIN 300 MG PO CAPS
300.0000 mg | ORAL_CAPSULE | Freq: Three times a day (TID) | ORAL | Status: DC
  Administered 2022-08-01 – 2022-08-05 (×13): 300 mg via ORAL
  Filled 2022-08-01 (×13): qty 1

## 2022-08-01 MED ORDER — METHYLPREDNISOLONE SODIUM SUCC 40 MG IJ SOLR
40.0000 mg | Freq: Two times a day (BID) | INTRAMUSCULAR | Status: DC
  Administered 2022-08-01 – 2022-08-05 (×8): 40 mg via INTRAVENOUS
  Filled 2022-08-01 (×8): qty 1

## 2022-08-01 MED ORDER — SODIUM CHLORIDE 0.9% FLUSH
3.0000 mL | Freq: Two times a day (BID) | INTRAVENOUS | Status: DC
  Administered 2022-08-01 – 2022-08-04 (×6): 3 mL via INTRAVENOUS
  Administered 2022-08-04: 10 mL via INTRAVENOUS
  Administered 2022-08-05: 3 mL via INTRAVENOUS

## 2022-08-01 MED ORDER — SENNOSIDES-DOCUSATE SODIUM 8.6-50 MG PO TABS: 1.0000 | ORAL_TABLET | Freq: Every evening | ORAL | Status: DC | PRN

## 2022-08-01 MED ORDER — HYDRALAZINE HCL 20 MG/ML IJ SOLN: 10.0000 mg | INTRAMUSCULAR | Status: DC | PRN

## 2022-08-01 MED ORDER — DOXYCYCLINE HYCLATE 100 MG PO TABS
100.0000 mg | ORAL_TABLET | Freq: Once | ORAL | Status: AC
  Administered 2022-08-01: 100 mg via ORAL
  Filled 2022-08-01: qty 1

## 2022-08-01 MED ORDER — DOXYCYCLINE HYCLATE 100 MG PO TABS
100.0000 mg | ORAL_TABLET | Freq: Two times a day (BID) | ORAL | Status: AC
  Administered 2022-08-01 – 2022-08-03 (×5): 100 mg via ORAL
  Filled 2022-08-01 (×5): qty 1

## 2022-08-01 MED ORDER — METOPROLOL TARTRATE 5 MG/5ML IV SOLN
5.0000 mg | INTRAVENOUS | Status: DC | PRN
  Administered 2022-08-02: 5 mg via INTRAVENOUS
  Filled 2022-08-01: qty 5

## 2022-08-01 MED ORDER — TRAZODONE HCL 50 MG PO TABS: 50.0000 mg | ORAL_TABLET | Freq: Every evening | ORAL | Status: DC | PRN

## 2022-08-01 NOTE — Evaluation (Signed)
Physical Therapy Evaluation Patient Details Name: Suzanne Glenn MRN: [id number] DOB: [birth year] Today's Date: 08/01/2022  History of Present Illness  71 year old with history of COPD on 3 L nasal cannula, right upper lobe lung cancer, CAD, DM 2, GERD, HLD, HTN brought to the hospital for respiratory distress.  Patient was diagnosed with pneumonia and started on antibiotics.  Initially required BiPAP due to hypercarbia and hypoxia, critical care team was consulted.  Clinical Impression  Pt did well with bed mobility and transfers, able to don socks and move in bed w/o issue.  However she was very quick to fatigue; on baseline 3L O2 she had SpO2 quickly drop to mid 80s just sitting EOB, recovered but with standing/ambulation continued to drop needing to increase to 4L just to maintain high/mid 80s during the modest ambulation effort.      Recommendations for follow up therapy are one component of a multi-disciplinary discharge planning process, led by the attending physician.  Recommendations may be updated based on patient status, additional functional criteria and insurance authorization.  Follow Up Recommendations Home health PT (per progress)      Assistance Recommended at Discharge Set up Supervision/Assistance  Patient can return home with the following       Equipment Recommendations None recommended by PT  Recommendations for Other Services       Functional Status Assessment Patient has had a recent decline in their functional status and demonstrates the ability to make significant improvements in function in a reasonable and predictable amount of time.     Precautions / Restrictions Precautions Precautions: Fall Restrictions Weight Bearing Restrictions: No      Mobility  Bed Mobility Overal bed mobility: Modified Independent             General bed mobility comments: Pt able to get herself up sitting w/o assist    Transfers Overall transfer level:  Independent Equipment used: Rolling walker (2 wheels)               General transfer comment: Pt was able to rise to standing w/o assist, good confidence but did need RW (no AD use at baseline)    Ambulation/Gait Ambulation/Gait assistance: Min guard Gait Distance (Feet): 40 Feet Assistive device: Rolling walker (2 wheels)         General Gait Details: Pt on 4L O2 during the effort with DOE, sats dropping into the mids 80s and subjective fatigue.  She did not have any LOBs or overt safety issues.  Pt does not typically need AD but was quick to fatigue and needing to lean/rely on walker during the effort.  Stairs            Wheelchair Mobility    Modified Rankin (Stroke Patients Only)       Balance Overall balance assessment: Modified Independent                                           Pertinent Vitals/Pain Pain Assessment Pain Assessment: No/denies pain    Home Living Family/patient expects to be discharged to:: Private residence Living Arrangements: Spouse/significant other Available Help at Discharge: Available 24 hours/day   Home Access: Stairs to enter Entrance Stairs-Rails: Can reach both Entrance Stairs-Number of Steps: ~20 1/2 steps   Home Layout: One level Home Equipment: Conservation officer, nature (2 wheels)      Prior Function Prior Level of Function :  Independent/Modified Independent             Mobility Comments: Pt reports she is normally able to be active w/o AD, OOH ~1x/wk ADLs Comments: able to manage the home, get dressed, sits in the shower     Hand Dominance        Extremity/Trunk Assessment   Upper Extremity Assessment Upper Extremity Assessment: Overall WFL for tasks assessed    Lower Extremity Assessment Lower Extremity Assessment: Overall WFL for tasks assessed       Communication   Communication: No difficulties  Cognition Arousal/Alertness: Awake/alert Behavior During Therapy: WFL for tasks  assessed/performed Overall Cognitive Status: Within Functional Limits for tasks assessed                                          General Comments General comments (skin integrity, edema, etc.): Pt reports that she is normally able to be relatively active w/o AD, much more quick to fatigue and needed to lean on AD this date.    Exercises     Assessment/Plan    PT Assessment Patient needs continued PT services  PT Problem List Decreased activity tolerance;Decreased mobility;Decreased safety awareness;Cardiopulmonary status limiting activity;Decreased knowledge of use of DME       PT Treatment Interventions Gait training;DME instruction;Functional mobility training;Therapeutic activities;Therapeutic exercise;Stair training;Balance training;Patient/family education    PT Goals (Current goals can be found in the Care Plan section)  Acute Rehab PT Goals Patient Stated Goal: go home PT Goal Formulation: With patient Time For Goal Achievement: 08/14/22 Potential to Achieve Goals: Good    Frequency Min 2X/week     Co-evaluation               AM-PAC PT "6 Clicks" Mobility  Outcome Measure Help needed turning from your back to your side while in a flat bed without using bedrails?: None Help needed moving from lying on your back to sitting on the side of a flat bed without using bedrails?: None Help needed moving to and from a bed to a chair (including a wheelchair)?: None Help needed standing up from a chair using your arms (e.g., wheelchair or bedside chair)?: None Help needed to walk in hospital room?: A Little Help needed climbing 3-5 steps with a railing? : A Little 6 Click Score: 22    End of Session Equipment Utilized During Treatment: Gait belt;Oxygen (SpO2 mid 90s) Activity Tolerance: Patient limited by fatigue Patient left: in chair;with call bell/phone within reach;with nursing/sitter in room Nurse Communication: Mobility status (O2 load with  activity) PT Visit Diagnosis: Muscle weakness (generalized) (M62.81);Difficulty in walking, not elsewhere classified (R26.2)    Time: 0488-8916 PT Time Calculation (min) (ACUTE ONLY): 24 min   Charges:   PT Evaluation $PT Eval Low Complexity: 1 Low PT Treatments $Gait Training: 8-22 mins        Kreg Shropshire, DPT 08/01/2022, 12:44 PM

## 2022-08-01 NOTE — Progress Notes (Signed)
Patient alert, intermit confusion. On 4 liters of oxygen. BP medication given this am. Patient tolerated breakfast. Patient does become short of breath during exertion. Pt ordered. Tx to floor.

## 2022-08-01 NOTE — Progress Notes (Signed)
PHARMACIST - PHYSICIAN COMMUNICATION  CONCERNING: Antibiotic IV to Oral Route Change Policy  RECOMMENDATION: This patient is receiving doxycycline by the intravenous route.  Based on criteria approved by the Pharmacy and Therapeutics Committee, the antibiotic(s) is/are being converted to the equivalent oral dose form(s).  DESCRIPTION: These criteria include: Patient being treated for a respiratory tract infection, urinary tract infection, cellulitis or clostridium difficile associated diarrhea if on metronidazole The patient is not neutropenic and does not exhibit a GI malabsorption state The patient is eating (either orally or via tube) and/or has been taking other orally administered medications for a least 24 hours The patient is improving clinically and has a Tmax < 100.5  If you have questions about this conversion, please contact the [place]  08/01/22

## 2022-08-01 NOTE — Progress Notes (Signed)
PROGRESS NOTE    Suzanne Glenn  FAO:[id number] DOB: Jul 03, 1951 DOA: 07/30/2022 PCP: Tracie Harrier, MD   Brief Narrative:  71 year old with history of COPD on 3 L nasal cannula, right upper lobe lung cancer, CAD, DM 2, GERD, HLD, HTN brought to the hospital for respiratory distress.  Patient was diagnosed with pneumonia and started on antibiotics. Initially due to hypercarbia and hypoxia also required BiPAP.  Critical care team was consulted.   Assessment & Plan:  Principal Problem:   Acute respiratory failure with hypoxia and hypercapnia (HCC) Active Problems:   Lobar pneumonia (HCC)   COPD with acute exacerbation (HCC)   Essential hypertension   Type 2 diabetes mellitus with hyperlipidemia (HCC)   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HCC)   Obesity (BMI 30-39.9)   * Acute respiratory failure with hypoxia and hypercapnia (HCC) - Slowly being weaned off BiPAP now on 3 L nasal cannula.  Getting bronchodilators scheduled and as necessary.  I-S/flutter valve.  Out of bed to chair   Lobar pneumonia ([place]) Left lobar pneumonia.   -Viral respiratory panel , COVID, influenza a and B  are all negative  -On Rocephin and doxycycline.   COPD with acute exacerbation ([place]) - Likely exacerbated by underlying pneumonia.  Getting Solu-Medrol 40 mg IV daily, bronchodilators.  Acute congestive heart failure with reduced ejection fraction - Echo in March 2023 showed EF of 45-50%, grade 1 DD, normal RV function.  BNP slightly elevated and did receive 1 dose of Lasix.  We will give another dose of IV Lasix   Obesity (BMI 30-39.9) BMI 34.33 with current height and weight in computer.   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[place]) Follow-up as outpatient.   Type 2 diabetes mellitus with hyperlipidemia (HCC) We will place on sliding scale insulin and continue pravastatin.  With steroid sugars will be higher.  Recent hemoglobin A1c 6.1.   Essential hypertension Currently on Lopressor twice  daily, Aldactone.  IV as needed   History of Parkinson's - On carbidopa/levodopa   PT/OT  DVT prophylaxis: Lovenox Code Status: DNR/DNI Family Communication: Husband present at bedside  Status is: Inpatient Still quite a bit of abnormal breath sounds.  Not ready for discharge yet    Subjective: Patient states she still feels short of breath with minimal exertion but overall feels better compared to yesterday   Examination:  General exam: Appears calm and comfortable, 4 L nasal cannula Respiratory system: Diffuse bilateral rhonchi Cardiovascular system: S1 & S2 heard, RRR. No JVD, murmurs, rubs, gallops or clicks. No pedal edema. Gastrointestinal system: Abdomen is nondistended, soft and nontender. No organomegaly or masses felt. Normal bowel sounds heard. Central nervous system: Alert and oriented. No focal neurological deficits. Extremities: Symmetric 5 x 5 power. Skin: No rashes, lesions or ulcers Psychiatry: Judgement and insight appear normal. Mood & affect appropriate.     Objective: Vitals:   08/01/22 0500 08/01/22 0600 08/01/22 0700 08/01/22 0733  BP: (!) 161/62 (!) 146/57 (!) 171/114   Pulse: 76 (!) 38 79   Resp: (!) 33 (!) 24 (!) 38   Temp: 98.6 F (37 C)  98.8 F (37.1 C)   TempSrc: Oral     SpO2: 93% 93% 94% 95%  Weight:      Height:        Intake/Output Summary (Last 24 hours) at 08/01/2022 0853 Last data filed at 08/01/2022 0500 Gross per 24 hour  Intake 1214.46 ml  Output 850 ml  Net 364.46 ml  Filed Weights   07/30/22 0729 07/30/22 1045  Weight: 87.9 kg 84.9 kg     Data Reviewed:   CBC: Recent Labs  Lab 07/30/22 0739 07/31/22 0412 08/01/22 0320  WBC 10.7* 9.9 12.0*  NEUTROABS 8.3*  --  9.8*  HGB 11.2* 9.6* 10.1*  HCT 39.7 32.7* 34.2*  MCV 102.3* 100.6* 98.8  PLT 278 229 295   Basic Metabolic Panel: Recent Labs  Lab 07/30/22 0739 07/30/22 0943 07/31/22 0412 08/01/22 0320  NA 141  --  141 143  K 4.2  --  4.0 3.8  CL  92*  --  93* 93*  CO2 40*  --  39* 42*  GLUCOSE 235*  --  145* 127*  BUN 19  --  28* 30*  CREATININE 0.69  --  0.96 0.69  CALCIUM 9.4  --  9.2 9.1  MG  --  2.4  --   --    GFR: Estimated Creatinine Clearance: 67.6 mL/min (by C-G formula based on SCr of 0.69 mg/dL). Liver Function Tests: Recent Labs  Lab 07/30/22 0739 08/01/22 0320  AST 15 13*  ALT 13 <5  ALKPHOS 86 67  BILITOT 0.5 0.3  PROT 7.7 6.3*  ALBUMIN 3.5 2.8*   No results for input(s): "LIPASE", "AMYLASE" in the last 168 hours. No results for input(s): "AMMONIA" in the last 168 hours. Coagulation Profile: Recent Labs  Lab 07/30/22 0739  INR 1.1   Cardiac Enzymes: No results for input(s): "CKTOTAL", "CKMB", "CKMBINDEX", "TROPONINI" in the last 168 hours. BNP (last 3 results) No results for input(s): "PROBNP" in the last 8760 hours. HbA1C: No results for input(s): "HGBA1C" in the last 72 hours. CBG: Recent Labs  Lab 07/31/22 0733 07/31/22 1146 07/31/22 1618 07/31/22 2139 08/01/22 0738  GLUCAP 125* 148* 203* 134* 134*   Lipid Profile: No results for input(s): "CHOL", "HDL", "LDLCALC", "TRIG", "CHOLHDL", "LDLDIRECT" in the last 72 hours. Thyroid Function Tests: No results for input(s): "TSH", "T4TOTAL", "FREET4", "T3FREE", "THYROIDAB" in the last 72 hours. Anemia Panel: No results for input(s): "VITAMINB12", "FOLATE", "FERRITIN", "TIBC", "IRON", "RETICCTPCT" in the last 72 hours. Sepsis Labs: Recent Labs  Lab 07/30/22 0740 07/30/22 1720 07/31/22 0412 08/01/22 0320  PROCALCITON <0.10  --  0.71 0.43  LATICACIDVEN 1.3 1.0  --   --     Recent Results (from the past 240 hour(s))  Culture, blood (Routine x 2)     Status: None (Preliminary result)   Collection Time: 07/30/22  7:39 AM   Specimen: BLOOD  Result Value Ref Range Status   Specimen Description BLOOD LEFT WRIST  Final   Special Requests   Final    BOTTLES DRAWN AEROBIC AND ANAEROBIC Blood Culture adequate volume   Culture   Final    NO  GROWTH 1 DAY Performed at [facility], [address], [place], [place] [address]    Report Status PENDING  Incomplete  Culture, blood (Routine x 2)     Status: None (Preliminary result)   Collection Time: 07/30/22  7:40 AM   Specimen: BLOOD  Result Value Ref Range Status   Specimen Description BLOOD LEFT [facility]  Final   Special Requests   Final    BOTTLES DRAWN AEROBIC AND ANAEROBIC Blood Culture adequate volume   Culture   Final    NO GROWTH 1 DAY Performed at [facility], [address] [place], [place] [address]    Report Status PENDING  Incomplete  Resp Panel by RT-PCR (Flu A&B, Covid) Anterior  Nasal Swab     Status: None   Collection Time: 07/30/22 10:00 AM   Specimen: Anterior Nasal Swab  Result Value Ref Range Status   SARS Coronavirus 2 by RT PCR NEGATIVE NEGATIVE Final    Comment: (NOTE) SARS-CoV-2 target nucleic acids are NOT DETECTED.  The SARS-CoV-2 RNA is generally detectable in upper respiratory specimens during the acute phase of infection. The lowest concentration of SARS-CoV-2 viral copies this assay can detect is 138 copies/mL. A negative result does not preclude SARS-Cov-2 infection and should not be used as the sole basis for treatment or other patient management decisions. A negative result may occur with  improper specimen collection/handling, submission of specimen other than nasopharyngeal swab, presence of viral mutation(s) within the areas targeted by this assay, and inadequate number of viral copies(<138 copies/mL). A negative result must be combined with clinical observations, patient history, and epidemiological information. The expected result is Negative.  Fact Sheet for Patients:  EntrepreneurPulse.com.au  Fact Sheet for Healthcare Providers:  IncredibleEmployment.be  This test is no t yet approved or cleared by the [place] FDA and  has been authorized for detection and/or  diagnosis of SARS-CoV-2 by FDA under an Emergency Use Authorization (EUA). This EUA will remain  in effect (meaning this test can be used) for the duration of the COVID-19 declaration under Section 564(b)(1) of the Act, 21 U.S.C.section 360bbb-3(b)(1), unless the authorization is terminated  or revoked sooner.       Influenza A by PCR NEGATIVE NEGATIVE Final   Influenza B by PCR NEGATIVE NEGATIVE Final    Comment: (NOTE) The Xpert Xpress SARS-CoV-2/FLU/RSV plus assay is intended as an aid in the diagnosis of influenza from Nasopharyngeal swab specimens and should not be used as a sole basis for treatment. Nasal washings and aspirates are unacceptable for Xpert Xpress SARS-CoV-2/FLU/RSV testing.  Fact Sheet for Patients: EntrepreneurPulse.com.au  Fact Sheet for Healthcare Providers: IncredibleEmployment.be  This test is not yet approved or cleared by the [place] FDA and has been authorized for detection and/or diagnosis of SARS-CoV-2 by FDA under an Emergency Use Authorization (EUA). This EUA will remain in effect (meaning this test can be used) for the duration of the COVID-19 declaration under Section 564(b)(1) of the Act, 21 U.S.C. section 360bbb-3(b)(1), unless the authorization is terminated or revoked.  Performed at [facility], [place],  [address]   Respiratory (~20 pathogens) panel by PCR     Status: None   Collection Time: 07/30/22 10:58 AM   Specimen: Nasopharyngeal Swab; Respiratory  Result Value Ref Range Status   Adenovirus NOT DETECTED NOT DETECTED Final   Coronavirus 229E NOT DETECTED NOT DETECTED Final    Comment: (NOTE) The Coronavirus on the Respiratory Panel, DOES NOT test for the novel  Coronavirus (2019 nCoV)    Coronavirus HKU1 NOT DETECTED NOT DETECTED Final   Coronavirus NL63 NOT DETECTED NOT DETECTED Final   Coronavirus OC43 NOT DETECTED NOT DETECTED Final    Metapneumovirus NOT DETECTED NOT DETECTED Final   Rhinovirus / Enterovirus NOT DETECTED NOT DETECTED Final   Influenza A NOT DETECTED NOT DETECTED Final   Influenza B NOT DETECTED NOT DETECTED Final   Parainfluenza Virus 1 NOT DETECTED NOT DETECTED Final   Parainfluenza Virus 2 NOT DETECTED NOT DETECTED Final   Parainfluenza Virus 3 NOT DETECTED NOT DETECTED Final   Parainfluenza Virus 4 NOT DETECTED NOT DETECTED Final   Respiratory Syncytial Virus NOT DETECTED NOT DETECTED Final   Bordetella pertussis  NOT DETECTED NOT DETECTED Final   Bordetella Parapertussis NOT DETECTED NOT DETECTED Final   Chlamydophila pneumoniae NOT DETECTED NOT DETECTED Final   Mycoplasma pneumoniae NOT DETECTED NOT DETECTED Final    Comment: Performed at [facility], [place] [address], [place], [place] [address]  MRSA Next Gen by PCR, Nasal     Status: None   Collection Time: 07/30/22 10:58 AM   Specimen: Nasal Mucosa; Nasal Swab  Result Value Ref Range Status   MRSA by PCR Next Gen NOT DETECTED NOT DETECTED Final    Comment: (NOTE) The GeneXpert MRSA Assay (FDA approved for NASAL specimens only), is one component of a comprehensive MRSA colonization surveillance program. It is not intended to diagnose MRSA infection nor to guide or monitor treatment for MRSA infections. Test performance is not FDA approved in patients less than 27 years old. Performed at [facility], [address] [place], [place] [address]   Expectorated Sputum Assessment w Gram Stain, Rflx to Resp Cult     Status: None   Collection Time: 07/30/22 12:30 PM   Specimen: Sputum  Result Value Ref Range Status   Specimen Description SPUTUM  Final   Special Requests NONE  Final   Sputum evaluation   Final    THIS SPECIMEN IS ACCEPTABLE FOR SPUTUM CULTURE Performed at [facility], [address] [place], [place] [address]    Report Status 07/30/2022 FINAL  Final  Culture, Respiratory w Gram Stain     Status: None  (Preliminary result)   Collection Time: 07/30/22 12:30 PM   Specimen: SPU  Result Value Ref Range Status   Specimen Description   Final    SPUTUM Performed at [facility], [address] [place], [place] [address]    Special Requests   Final    NONE Reflexed from [id number] Performed at [facility], [address] [place], [place] [address]    Gram Stain   Final    FEW WBC PRESENT, PREDOMINANTLY MONONUCLEAR MODERATE GRAM POSITIVE COCCI IN PAIRS MODERATE GRAM POSITIVE COCCI IN CLUSTERS MODERATE GRAM NEGATIVE RODS    Culture   Final    CULTURE REINCUBATED FOR BETTER GROWTH Performed at [facility], [place] [address], [place], [place] [address]    Report Status PENDING  Incomplete         Radiology Studies: No results found.      Scheduled Meds:  aspirin  81 mg Oral Daily   budesonide (PULMICORT) nebulizer solution  0.25 mg Nebulization BID   carbidopa-levodopa  1 tablet Oral TID   Chlorhexidine Gluconate Cloth  6 each Topical Daily   doxycycline  100 mg Oral Q12H   enoxaparin (LOVENOX) injection  0.5 mg/kg Subcutaneous Q24H   insulin aspart  0-5 Units Subcutaneous QHS   insulin aspart  0-9 Units Subcutaneous TID WC   ipratropium-albuterol  3 mL Nebulization Q6H   isosorbide mononitrate  30 mg Oral Daily   methylPREDNISolone (SOLU-MEDROL) injection  40 mg Intravenous Daily   metoprolol tartrate  12.5 mg Oral BID   mirtazapine  7.5 mg Oral QHS   pantoprazole  40 mg Oral Daily   pravastatin  80 mg Oral QPM   spironolactone  25 mg Oral Daily   venlafaxine XR  225 mg Oral Daily   Continuous Infusions:  cefTRIAXone (ROCEPHIN)  IV 2 g (07/31/22 1653)     LOS: 2 days   Time spent= 35 mins    Shabazz Mckey Chirag Karam Dunson,  MD [facility]  If 7PM-7AM, please contact night-coverage  08/01/2022, 8:53 AM

## 2022-08-01 NOTE — Progress Notes (Signed)
Spoke with Dr Reesa Chew regarding patients blood pressure. Orders to give medication earlier. Continue to assess.

## 2022-08-01 NOTE — Evaluation (Signed)
Occupational Therapy Evaluation Patient Details Name: Suzanne Glenn MRN: [id number] DOB: July 01, 1951 Today's Date: 08/01/2022   History of Present Illness pt is a 71 year old female admitted with pneumonia and started on antibiotics.  Initially due to hypercarbia and hypoxia also required BiPAP.; PMH significant for COPD on 3 L nasal cannula, right upper lobe lung cancer, CAD, DM 2, GERD, HLD, HTN   Clinical Impression   Chart reviewed, RN cleared pt for participation in OT evaluation. Pt is alert and oriented x4, reports PTA generally MOD I in ADL/assist with IADL as needed, amb with no AD. Pt presents with deficits in activity tolerance due to current pulmonary function, would benefit from skilled OT to address deficits and to provide additional education re: ADL completion utilizing energy conservation techniques. Functional mobility completed approx 30' in room with no AD with supervision, simulated toilet transfers with supervision, vcs required throughout for energy conservation techniques. Pt reports she plans for her husband to assist with getting into house. Recommend  3 in 1 bedside commode. Pt is left in bedside chair, all needs met. OT will continue to follow acutely.   Of note: Pt desated to 86% on 4L via [place] after amb approx 30', recovered to above 90% on 4 L via [place] after approx 30 seconds. HR up to 125 with activity.      Recommendations for follow up therapy are one component of a multi-disciplinary discharge planning process, led by the attending physician.  Recommendations may be updated based on patient status, additional functional criteria and insurance authorization.   Follow Up Recommendations  Home health OT    Assistance Recommended at Discharge Intermittent Supervision/Assistance  Patient can return home with the following A little help with walking and/or transfers;A little help with bathing/dressing/bathroom    Functional Status Assessment  Patient has had a  recent decline in their functional status and demonstrates the ability to make significant improvements in function in a reasonable and predictable amount of time.  Equipment Recommendations  BSC/3in1    Recommendations for Other Services       Precautions / Restrictions Precautions Precautions: Fall Restrictions Weight Bearing Restrictions: No      Mobility Bed Mobility Overal bed mobility: Modified Independent                  Transfers Overall transfer level: Needs assistance   Transfers: Sit to/from Stand Sit to Stand: Supervision           General transfer comment: without AD      Balance Overall balance assessment: Needs assistance Sitting-balance support: Feet supported Sitting balance-Leahy Scale: Good     Standing balance support: During functional activity Standing balance-Leahy Scale: Good                             ADL either performed or assessed with clinical judgement   ADL Overall ADL's : Needs assistance/impaired Eating/Feeding: Set up;Sitting   Grooming: Wash/dry hands;Sitting;Standing;Set up               Lower Body Dressing: Min guard Lower Body Dressing Details (indicate cue type and reason): anticipated Toilet Transfer: Supervision/safety;Ambulation Toilet Transfer Details (indicate cue type and reason): no AD, vcs for energy conservation techniques Toileting- Clothing Manipulation and Hygiene: Min guard Toileting - Clothing Manipulation Details (indicate cue type and reason): anticipated     Functional mobility during ADLs: Supervision/safety (approx 30' in room on 4L via [place] with  intermittent vcs for ec techniques)       Vision Patient Visual Report: No change from baseline       Perception     Praxis      Pertinent Vitals/Pain Pain Assessment Pain Assessment: No/denies pain     Hand Dominance     Extremity/Trunk Assessment Upper Extremity Assessment Upper Extremity Assessment: Overall WFL  for tasks assessed   Lower Extremity Assessment Lower Extremity Assessment: Overall WFL for tasks assessed   Cervical / Trunk Assessment Cervical / Trunk Assessment: Normal   Communication Communication Communication: No difficulties   Cognition Arousal/Alertness: Awake/alert Behavior During Therapy: WFL for tasks assessed/performed Overall Cognitive Status: Within Functional Limits for tasks assessed                                       General Comments  Pt reports that she is normally able to be relatively active w/o AD, much more quick to fatigue and needed to lean on AD this date.    Exercises Other Exercises Other Exercises: educated re: role of OT, role of rehab, discharge recommendations, DME use for safe ADL completion   Shoulder Instructions      Home Living Family/patient expects to be discharged to:: Private residence Living Arrangements: Spouse/significant other Available Help at Discharge: Available 24 hours/day Type of Home: Mobile home Home Access: Stairs to enter [employer] of Steps: ~20 1/2 steps Entrance Stairs-Rails: Can reach both Home Layout: One level     Bathroom Shower/Tub: Tub/shower unit     Bathroom Accessibility: No   Home Equipment: Conservation officer, nature (2 wheels);Shower seat          Prior Functioning/Environment Prior Level of Function : Independent/Modified Independent             Mobility Comments: MOD I-I, no AD ADLs Comments: MOD I ADL, assist with IADL as needed        OT Problem List: Decreased activity tolerance;Decreased knowledge of use of DME or AE      OT Treatment/Interventions: Self-care/ADL training;Energy conservation;Therapeutic exercise;Therapeutic activities    OT Goals(Current goals can be found in the care plan section) Acute Rehab OT Goals Patient Stated Goal: go home OT Goal Formulation: With patient Time For Goal Achievement: 08/15/22 Potential to Achieve Goals: Good ADL  Goals Pt Will Perform Grooming: with modified independence;sitting Pt Will Perform Lower Body Dressing: with modified independence;sit to/from stand Pt Will Transfer to Toilet: with modified independence;ambulating Pt Will Perform Toileting - Clothing Manipulation and hygiene: with modified independence;sit to/from stand  OT Frequency: Min 2X/week    Co-evaluation              AM-PAC OT "6 Clicks" Daily Activity     Outcome Measure Help from another person eating meals?: None Help from another person taking care of personal grooming?: None Help from another person toileting, which includes using toliet, bedpan, or urinal?: None Help from another person bathing (including washing, rinsing, drying)?: A Little Help from another person to put on and taking off regular upper body clothing?: None Help from another person to put on and taking off regular lower body clothing?: A Little 6 Click Score: 22   End of Session Equipment Utilized During Treatment: Oxygen Nurse Communication: Mobility status  Activity Tolerance: Patient tolerated treatment well Patient left: in chair;with call bell/phone within reach;with chair alarm set  OT Visit Diagnosis: Unsteadiness on feet (R26.81)  Time: 3353-3174 OT Time Calculation (min): 19 min Charges:  OT General Charges $OT Visit: 1 Visit OT Evaluation $OT Eval Low Complexity: 1 Low  Shanon Payor, OTD OTR/L  08/01/22, 1:26 PM

## 2022-08-02 DIAGNOSIS — J9601 Acute respiratory failure with hypoxia: Secondary | ICD-10-CM | POA: Diagnosis not present

## 2022-08-02 DIAGNOSIS — J9602 Acute respiratory failure with hypercapnia: Secondary | ICD-10-CM | POA: Diagnosis not present

## 2022-08-02 LAB — CBC
HCT: 35.7 % — ABNORMAL LOW (ref 36.0–46.0)
Hemoglobin: 10.6 g/dL — ABNORMAL LOW (ref 12.0–15.0)
MCH: 29.2 pg (ref 26.0–34.0)
MCHC: 29.7 g/dL — ABNORMAL LOW (ref 30.0–36.0)
MCV: 98.3 fL (ref 80.0–100.0)
Platelets: 336 10*3/uL (ref 150–400)
RBC: 3.63 MIL/uL — ABNORMAL LOW (ref 3.87–5.11)
RDW: 13.6 % (ref 11.5–15.5)
WBC: 9.8 10*3/uL (ref 4.0–10.5)
nRBC: 0.3 % — ABNORMAL HIGH (ref 0.0–0.2)

## 2022-08-02 LAB — BASIC METABOLIC PANEL
Anion gap: 8 (ref 5–15)
BUN: 33 mg/dL — ABNORMAL HIGH (ref 8–23)
CO2: 45 mmol/L — ABNORMAL HIGH (ref 22–32)
Calcium: 9.1 mg/dL (ref 8.9–10.3)
Chloride: 88 mmol/L — ABNORMAL LOW (ref 98–111)
Creatinine, Ser: 0.72 mg/dL (ref 0.44–1.00)
GFR, Estimated: >60 mL/min (ref >60)
Glucose, Bld: 182 mg/dL — ABNORMAL HIGH (ref 70–99)
Potassium: 4.9 mmol/L (ref 3.5–5.1)
Sodium: 141 mmol/L (ref 135–145)

## 2022-08-02 LAB — MAGNESIUM: Magnesium: 2 mg/dL (ref 1.7–2.4)

## 2022-08-02 LAB — GLUCOSE, CAPILLARY
Glucose-Capillary: 180 mg/dL — ABNORMAL HIGH (ref 70–99)
Glucose-Capillary: 200 mg/dL — ABNORMAL HIGH (ref 70–99)
Glucose-Capillary: 313 mg/dL — ABNORMAL HIGH (ref 70–99)
Glucose-Capillary: 98 mg/dL (ref 70–99)

## 2022-08-02 LAB — BRAIN NATRIURETIC PEPTIDE: B Natriuretic Peptide: 639.5 pg/mL — ABNORMAL HIGH (ref 0.0–100.0)

## 2022-08-02 MED ORDER — FUROSEMIDE 10 MG/ML IJ SOLN
40.0000 mg | Freq: Once | INTRAMUSCULAR | Status: AC
  Administered 2022-08-02: 40 mg via INTRAVENOUS
  Filled 2022-08-02: qty 4

## 2022-08-02 NOTE — Progress Notes (Signed)
Occupational Therapy Treatment Patient Details Name: Suzanne Glenn MRN: [id number] DOB: 1951-08-06 Today's Date: 08/02/2022   History of present illness pt is a 71 year old female admitted with pneumonia and started on antibiotics.  Initially due to hypercarbia and hypoxia also required BiPAP.; PMH significant for COPD on 3 L nasal cannula, right upper lobe lung cancer, CAD, DM 2, GERD, HLD, HTN   OT comments  Ms. Baze received in bed, O2 sats in low 70s. Therapist discovers that pt's O2 tubing was disconnected. Once reconnected, O2 sats return to low 90s within ~ 5 minutes, and pt is able to proceed with OOB movement. Pt mod I in bed mobility, SUPV for OOB movement, with good balance w/o AD. Provided educ re: energy conservation strategies, deep breathing techniques, home modifications, possible trial of DME as an ECS. Pt verbalizes understanding. Recommend HHOT post-DC.   Recommendations for follow up therapy are one component of a multi-disciplinary discharge planning process, led by the attending physician.  Recommendations may be updated based on patient status, additional functional criteria and insurance authorization.    Follow Up Recommendations  Home health OT    Assistance Recommended at Discharge Intermittent Supervision/Assistance  Patient can return home with the following  A little help with walking and/or transfers;A little help with bathing/dressing/bathroom   Equipment Recommendations  BSC/3in1    Recommendations for Other Services      Precautions / Restrictions Precautions Precautions: Fall Restrictions Weight Bearing Restrictions: No       Mobility Bed Mobility Overal bed mobility: Modified Independent             General bed mobility comments: Pt able to move easily and quickly into sitting w/o assistance    Transfers Overall transfer level: Needs assistance   Transfers: Sit to/from Stand Sit to Stand: Supervision           General  transfer comment: without AD     Balance Overall balance assessment: Needs assistance Sitting-balance support: Feet supported Sitting balance-Leahy Scale: Good     Standing balance support: During functional activity Standing balance-Leahy Scale: Good                             ADL either performed or assessed with clinical judgement   ADL                                              Extremity/Trunk Assessment Upper Extremity Assessment Upper Extremity Assessment: Overall WFL for tasks assessed   Lower Extremity Assessment Lower Extremity Assessment: Overall WFL for tasks assessed   Cervical / Trunk Assessment Cervical / Trunk Assessment: Normal    Vision       Perception     Praxis      Cognition Arousal/Alertness: Awake/alert Behavior During Therapy: WFL for tasks assessed/performed Overall Cognitive Status: Within Functional Limits for tasks assessed                                          Exercises Other Exercises Other Exercises: Educ re: home modifications, ECS, activity pacing, breathing techniques, trial use of DME    Shoulder Instructions       General Comments      Pertinent  Vitals/ Pain       Pain Assessment Pain Assessment: No/denies pain  Home Living                                          Prior Functioning/Environment              Frequency  Min 2X/week        Progress Toward Goals  OT Goals(current goals can now be found in the care plan section)  Progress towards OT goals: Progressing toward goals  Acute Rehab OT Goals Patient Stated Goal: to go home OT Goal Formulation: With patient Time For Goal Achievement: 08/15/22 Potential to Achieve Goals: Good  Plan Discharge plan remains appropriate    Co-evaluation                 AM-PAC OT "6 Clicks" Daily Activity     Outcome Measure   Help from another person eating meals?: None Help from  another person taking care of personal grooming?: None Help from another person toileting, which includes using toliet, bedpan, or urinal?: None Help from another person bathing (including washing, rinsing, drying)?: A Little Help from another person to put on and taking off regular upper body clothing?: None Help from another person to put on and taking off regular lower body clothing?: A Little 6 Click Score: 22    End of Session Equipment Utilized During Treatment: Oxygen  OT Visit Diagnosis: Unsteadiness on feet (R26.81);Muscle weakness (generalized) (M62.81)   Activity Tolerance Patient tolerated treatment well   Patient Left in chair;with call bell/phone within reach;with chair alarm set   Nurse Communication          Time: 7517-0017 OT Time Calculation (min): 19 min  Charges: OT General Charges $OT Visit: 1 Visit OT Treatments $Self Care/Home Management : 8-22 mins  Josiah Lobo, PhD, MS, OTR/L 08/02/22, 12:32 PM

## 2022-08-02 NOTE — Care Management Important Message (Signed)
Important Message  Patient Details  Name: Suzanne Glenn MRN: [id number] Date of Birth: 11/27/1950   Medicare Important Message Given:  Yes     Dannette Barbara 08/02/2022, 3:13 PM

## 2022-08-02 NOTE — Progress Notes (Signed)
PROGRESS NOTE    Suzanne Glenn  SPQ:[id number] DOB: 1951-09-04 DOA: 07/30/2022 PCP: Tracie Harrier, MD   Brief Narrative:  71 year old with history of COPD on 3 L nasal cannula, right upper lobe lung cancer, CAD, DM 2, GERD, HLD, HTN brought to the hospital for respiratory distress.  Patient was diagnosed with pneumonia and started on antibiotics. Initially due to hypercarbia and hypoxia also required BiPAP.  Critical care team was consulted.   Assessment & Plan:  Principal Problem:   Acute respiratory failure with hypoxia and hypercapnia (HCC) Active Problems:   Lobar pneumonia (HCC)   COPD with acute exacerbation (HCC)   Essential hypertension   Type 2 diabetes mellitus with hyperlipidemia (HCC)   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HCC)   Obesity (BMI 30-39.9)   * Acute respiratory failure with hypoxia and hypercapnia (HCC) - Now on 4 L nasal cannula.  Getting bronchodilators scheduled and as necessary.  I-S/flutter valve.  Out of bed to chair   Lobar pneumonia ([place]) Left lobar pneumonia.   -Viral respiratory panel , COVID, influenza a and B  are all negative  -On Rocephin and doxycycline.  Plan to complete 5-day course   COPD with acute exacerbation ([place]) - Likely exacerbated by underlying pneumonia.  Continue Solu-Medrol IV twice daily, bronchodilators  Acute congestive heart failure with reduced ejection fraction - Echo in March 2023 showed EF of 45-50%, grade 1 DD, normal RV function.  Subtle elevated BNP, give another dose of IV Lasix today   Obesity (BMI 30-39.9) BMI 34.33 with current height and weight in computer.   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[place]) Follow-up as outpatient.   Type 2 diabetes mellitus with hyperlipidemia (HCC) We will place on sliding scale insulin and continue pravastatin.  With steroid sugars will be higher.  Recent hemoglobin A1c 6.1.   Essential hypertension Currently on Lopressor twice daily, Aldactone.  IV as needed    History of Parkinson's - On carbidopa/levodopa   PT/OT= Home health, orders placed  DVT prophylaxis: Lovenox Code Status: DNR/DNI Family Communication: Husband present at bedside  Status is: Inpatient Still has abnormal breath sounds.  Maintain hospital stay for IV diuretics, IV steroids and bronchodilators  Subjective: Tells in-transit shortness of breath this is the best she has felt in at least 3 months but still has exertional shortness of breath and abnormal breath sounds.   Examination: Constitutional: Not in acute distress, 3-4 L nasal cannula Respiratory: Easily diminished breath sounds Cardiovascular: Normal sinus rhythm, no rubs Abdomen: Nontender nondistended good bowel sounds Musculoskeletal: No edema noted Skin: No rashes seen Neurologic: CN 2-12 grossly intact.  And nonfocal Psychiatric: Normal judgment and insight. Alert and oriented x 3. Normal mood. Objective: Vitals:   08/02/22 0105 08/02/22 0156 08/02/22 0500 08/02/22 0821  BP: (!) 157/88  (!) 147/92 (!) 149/89  Pulse: (!) 105   (!) 110  Resp: 20  20 19   Temp: 98.4 F (36.9 C)  98.6 F (37 C) 98.1 F (36.7 C)  TempSrc: Oral  Oral   SpO2: 99% 99% 92% 98%  Weight:      Height:        Intake/Output Summary (Last 24 hours) at 08/02/2022 1107 Last data filed at 08/02/2022 0400 Gross per 24 hour  Intake 320 ml  Output --  Net 320 ml   Filed Weights   07/30/22 0729 07/30/22 1045  Weight: 87.9 kg 84.9 kg     Data Reviewed:   CBC: Recent Labs  Lab  07/30/22 0739 07/31/22 0412 08/01/22 0320 08/01/22 0930 08/02/22 0401  WBC 10.7* 9.9 12.0* 13.0* 9.8  NEUTROABS 8.3*  --  9.8*  --   --   HGB 11.2* 9.6* 10.1* 10.4* 10.6*  HCT 39.7 32.7* 34.2* 34.8* 35.7*  MCV 102.3* 100.6* 98.8 98.6 98.3  PLT 278 229 286 315 782   Basic Metabolic Panel: Recent Labs  Lab 07/30/22 0739 07/30/22 0943 07/31/22 0412 08/01/22 0320 08/02/22 0401  NA 141  --  141 143 141  K 4.2  --  4.0 3.8 4.9  CL 92*   --  93* 93* 88*  CO2 40*  --  39* 42* 45*  GLUCOSE 235*  --  145* 127* 182*  BUN 19  --  28* 30* 33*  CREATININE 0.69  --  0.96 0.69 0.72  CALCIUM 9.4  --  9.2 9.1 9.1  MG  --  2.4  --   --  2.0   GFR: Estimated Creatinine Clearance: 67.6 mL/min (by C-G formula based on SCr of 0.72 mg/dL). Liver Function Tests: Recent Labs  Lab 07/30/22 0739 08/01/22 0320  AST 15 13*  ALT 13 <5  ALKPHOS 86 67  BILITOT 0.5 0.3  PROT 7.7 6.3*  ALBUMIN 3.5 2.8*   No results for input(s): "LIPASE", "AMYLASE" in the last 168 hours. No results for input(s): "AMMONIA" in the last 168 hours. Coagulation Profile: Recent Labs  Lab 07/30/22 0739  INR 1.1   Cardiac Enzymes: No results for input(s): "CKTOTAL", "CKMB", "CKMBINDEX", "TROPONINI" in the last 168 hours. BNP (last 3 results) No results for input(s): "PROBNP" in the last 8760 hours. HbA1C: No results for input(s): "HGBA1C" in the last 72 hours. CBG: Recent Labs  Lab 08/01/22 0738 08/01/22 1143 08/01/22 1627 08/01/22 2110 08/02/22 0824  GLUCAP 134* 210* 189* 145* 180*   Lipid Profile: No results for input(s): "CHOL", "HDL", "LDLCALC", "TRIG", "CHOLHDL", "LDLDIRECT" in the last 72 hours. Thyroid Function Tests: No results for input(s): "TSH", "T4TOTAL", "FREET4", "T3FREE", "THYROIDAB" in the last 72 hours. Anemia Panel: No results for input(s): "VITAMINB12", "FOLATE", "FERRITIN", "TIBC", "IRON", "RETICCTPCT" in the last 72 hours. Sepsis Labs: Recent Labs  Lab 07/30/22 0740 07/30/22 1720 07/31/22 0412 08/01/22 0320  PROCALCITON <0.10  --  0.71 0.43  LATICACIDVEN 1.3 1.0  --   --     Recent Results (from the past 240 hour(s))  Culture, blood (Routine x 2)     Status: None (Preliminary result)   Collection Time: 07/30/22  7:39 AM   Specimen: BLOOD  Result Value Ref Range Status   Specimen Description BLOOD LEFT WRIST  Final   Special Requests   Final    BOTTLES DRAWN AEROBIC AND ANAEROBIC Blood Culture adequate volume    Culture   Final    NO GROWTH 3 DAYS Performed at [facility], [address], [place], [place] [address]    Report Status PENDING  Incomplete  Culture, blood (Routine x 2)     Status: None (Preliminary result)   Collection Time: 07/30/22  7:40 AM   Specimen: BLOOD  Result Value Ref Range Status   Specimen Description BLOOD LEFT [facility]  Final   Special Requests   Final    BOTTLES DRAWN AEROBIC AND ANAEROBIC Blood Culture adequate volume   Culture   Final    NO GROWTH 3 DAYS Performed at [facility], [address] [place], [place] [address]    Report Status PENDING  Incomplete  Resp Panel by RT-PCR (  Flu A&B, Covid) Anterior Nasal Swab     Status: None   Collection Time: 07/30/22 10:00 AM   Specimen: Anterior Nasal Swab  Result Value Ref Range Status   SARS Coronavirus 2 by RT PCR NEGATIVE NEGATIVE Final    Comment: (NOTE) SARS-CoV-2 target nucleic acids are NOT DETECTED.  The SARS-CoV-2 RNA is generally detectable in upper respiratory specimens during the acute phase of infection. The lowest concentration of SARS-CoV-2 viral copies this assay can detect is 138 copies/mL. A negative result does not preclude SARS-Cov-2 infection and should not be used as the sole basis for treatment or other patient management decisions. A negative result may occur with  improper specimen collection/handling, submission of specimen other than nasopharyngeal swab, presence of viral mutation(s) within the areas targeted by this assay, and inadequate number of viral copies(<138 copies/mL). A negative result must be combined with clinical observations, patient history, and epidemiological information. The expected result is Negative.  Fact Sheet for Patients:  EntrepreneurPulse.com.au  Fact Sheet for Healthcare Providers:  IncredibleEmployment.be  This test is no t yet approved or cleared by the [place] FDA and  has been authorized for  detection and/or diagnosis of SARS-CoV-2 by FDA under an Emergency Use Authorization (EUA). This EUA will remain  in effect (meaning this test can be used) for the duration of the COVID-19 declaration under Section 564(b)(1) of the Act, 21 U.S.C.section 360bbb-3(b)(1), unless the authorization is terminated  or revoked sooner.       Influenza A by PCR NEGATIVE NEGATIVE Final   Influenza B by PCR NEGATIVE NEGATIVE Final    Comment: (NOTE) The Xpert Xpress SARS-CoV-2/FLU/RSV plus assay is intended as an aid in the diagnosis of influenza from Nasopharyngeal swab specimens and should not be used as a sole basis for treatment. Nasal washings and aspirates are unacceptable for Xpert Xpress SARS-CoV-2/FLU/RSV testing.  Fact Sheet for Patients: EntrepreneurPulse.com.au  Fact Sheet for Healthcare Providers: IncredibleEmployment.be  This test is not yet approved or cleared by the [place] FDA and has been authorized for detection and/or diagnosis of SARS-CoV-2 by FDA under an Emergency Use Authorization (EUA). This EUA will remain in effect (meaning this test can be used) for the duration of the COVID-19 declaration under Section 564(b)(1) of the Act, 21 U.S.C. section 360bbb-3(b)(1), unless the authorization is terminated or revoked.  Performed at [facility], [place], [place] [address]   Respiratory (~20 pathogens) panel by PCR     Status: None   Collection Time: 07/30/22 10:58 AM   Specimen: Nasopharyngeal Swab; Respiratory  Result Value Ref Range Status   Adenovirus NOT DETECTED NOT DETECTED Final   Coronavirus 229E NOT DETECTED NOT DETECTED Final    Comment: (NOTE) The Coronavirus on the Respiratory Panel, DOES NOT test for the novel  Coronavirus (2019 nCoV)    Coronavirus HKU1 NOT DETECTED NOT DETECTED Final   Coronavirus NL63 NOT DETECTED NOT DETECTED Final   Coronavirus OC43 NOT DETECTED NOT DETECTED Final    Metapneumovirus NOT DETECTED NOT DETECTED Final   Rhinovirus / Enterovirus NOT DETECTED NOT DETECTED Final   Influenza A NOT DETECTED NOT DETECTED Final   Influenza B NOT DETECTED NOT DETECTED Final   Parainfluenza Virus 1 NOT DETECTED NOT DETECTED Final   Parainfluenza Virus 2 NOT DETECTED NOT DETECTED Final   Parainfluenza Virus 3 NOT DETECTED NOT DETECTED Final   Parainfluenza Virus 4 NOT DETECTED NOT DETECTED Final   Respiratory Syncytial Virus NOT DETECTED NOT DETECTED Final  Bordetella pertussis NOT DETECTED NOT DETECTED Final   Bordetella Parapertussis NOT DETECTED NOT DETECTED Final   Chlamydophila pneumoniae NOT DETECTED NOT DETECTED Final   Mycoplasma pneumoniae NOT DETECTED NOT DETECTED Final    Comment: Performed at [facility], [place] [address], [place], [place] [address]  MRSA Next Gen by PCR, Nasal     Status: None   Collection Time: 07/30/22 10:58 AM   Specimen: Nasal Mucosa; Nasal Swab  Result Value Ref Range Status   MRSA by PCR Next Gen NOT DETECTED NOT DETECTED Final    Comment: (NOTE) The GeneXpert MRSA Assay (FDA approved for NASAL specimens only), is one component of a comprehensive MRSA colonization surveillance program. It is not intended to diagnose MRSA infection nor to guide or monitor treatment for MRSA infections. Test performance is not FDA approved in patients less than 35 years old. Performed at [facility], [address] [place], [place] [address]   Expectorated Sputum Assessment w Gram Stain, Rflx to Resp Cult     Status: None   Collection Time: 07/30/22 12:30 PM   Specimen: Sputum  Result Value Ref Range Status   Specimen Description SPUTUM  Final   Special Requests NONE  Final   Sputum evaluation   Final    THIS SPECIMEN IS ACCEPTABLE FOR SPUTUM CULTURE Performed at [facility], [address], [place], [place] [address]    Report Status 07/30/2022 FINAL  Final  Culture, Respiratory w Gram Stain     Status:  None (Preliminary result)   Collection Time: 07/30/22 12:30 PM   Specimen: SPU  Result Value Ref Range Status   Specimen Description   Final    SPUTUM Performed at [facility], [address] [place], [place] [address]    Special Requests   Final    NONE Reflexed from [phone] Performed at [facility], [address] [place], [place] [address]    Gram Stain   Final    FEW WBC PRESENT, PREDOMINANTLY MONONUCLEAR MODERATE GRAM POSITIVE COCCI IN PAIRS MODERATE GRAM POSITIVE COCCI IN CLUSTERS MODERATE GRAM NEGATIVE RODS    Culture   Final    CULTURE REINCUBATED FOR BETTER GROWTH Performed at [facility], [place] [address] [place],  [address]    Report Status PENDING  Incomplete         Radiology Studies: No results found.      Scheduled Meds:  aspirin  81 mg Oral Daily   budesonide (PULMICORT) nebulizer solution  0.25 mg Nebulization BID   carbidopa-levodopa  1 tablet Oral TID   Chlorhexidine Gluconate Cloth  6 each Topical Daily   doxycycline  100 mg Oral Q12H   enoxaparin (LOVENOX) injection  0.5 mg/kg Subcutaneous Q24H   gabapentin  300 mg Oral TID   insulin aspart  0-5 Units Subcutaneous QHS   insulin aspart  0-9 Units Subcutaneous TID WC   ipratropium-albuterol  3 mL Nebulization Q6H   isosorbide mononitrate  30 mg Oral Daily   methylPREDNISolone (SOLU-MEDROL) injection  40 mg Intravenous BID   metoprolol tartrate  12.5 mg Oral BID   mirtazapine  7.5 mg Oral QHS   pantoprazole  40 mg Oral Daily   pravastatin  80 mg Oral QPM   sodium chloride flush  3 mL Intravenous Q12H   spironolactone  25 mg Oral Daily   venlafaxine XR  225 mg Oral Daily   Continuous Infusions:  cefTRIAXone (ROCEPHIN)  IV 2 g (08/01/22  1550)     LOS: 3 days   Time spent= 35 mins    Bracken Moffa Arsenio Loader, MD [facility]  If 7PM-7AM, please contact night-coverage  08/02/2022, 11:07 AM

## 2022-08-02 NOTE — Progress Notes (Signed)
Attempted to get patient up and sit in the chair this am. Patient stated she did not want to get up right now, will get up after breakfast.

## 2022-08-02 NOTE — Consult Note (Signed)
   [facility] Inpatient Consult   08/02/2022  KALEN NEIDERT 1950-11-12 [id number]  [facility] [ACO] Patient: [facility] Liaison remote coverage review for [facility] for review of post hospital follow up community needs for care coordination  Primary Care Provider:  Tracie Harrier, MD, [facility]  Patient screened for less than 30 days readmission hospitalization with noted high risk score for unplanned readmission risk. Reviewed  to assess for potential [facility] [facility] service needs for post hospital transition.  Review of patient's electronic medical record of PT/OT and MD progress notes reveals patient is currently ongoing needs for medical management. Curent recommendations are for home with home health.  Plan:  Continue to follow progress and disposition to assess for post hospital care management needs.    For questions contact:   Natividad Brood, RN BSN [place]  [phone] business mobile phone Toll free office [phone]  *[place]  [phone] Fax number: [phone] [email or web address]

## 2022-08-03 DIAGNOSIS — J9601 Acute respiratory failure with hypoxia: Secondary | ICD-10-CM | POA: Diagnosis not present

## 2022-08-03 DIAGNOSIS — J9602 Acute respiratory failure with hypercapnia: Secondary | ICD-10-CM | POA: Diagnosis not present

## 2022-08-03 LAB — CULTURE, RESPIRATORY W GRAM STAIN: Culture: NORMAL

## 2022-08-03 LAB — BASIC METABOLIC PANEL
Anion gap: 12 (ref 5–15)
BUN: 31 mg/dL — ABNORMAL HIGH (ref 8–23)
CO2: 41 mmol/L — ABNORMAL HIGH (ref 22–32)
Calcium: 8.9 mg/dL (ref 8.9–10.3)
Chloride: 84 mmol/L — ABNORMAL LOW (ref 98–111)
Creatinine, Ser: 0.73 mg/dL (ref 0.44–1.00)
GFR, Estimated: >60 mL/min (ref >60)
Glucose, Bld: 189 mg/dL — ABNORMAL HIGH (ref 70–99)
Potassium: 3.7 mmol/L (ref 3.5–5.1)
Sodium: 137 mmol/L (ref 135–145)

## 2022-08-03 LAB — GLUCOSE, CAPILLARY
Glucose-Capillary: 211 mg/dL — ABNORMAL HIGH (ref 70–99)
Glucose-Capillary: 282 mg/dL — ABNORMAL HIGH (ref 70–99)
Glucose-Capillary: 156 mg/dL — ABNORMAL HIGH (ref 70–99)
Glucose-Capillary: 202 mg/dL — ABNORMAL HIGH (ref 70–99)

## 2022-08-03 LAB — MAGNESIUM: Magnesium: 1.7 mg/dL (ref 1.7–2.4)

## 2022-08-03 LAB — LEGIONELLA PNEUMOPHILA SEROGP 1 UR AG: L. pneumophila Serogp 1 Ur Ag: NEGATIVE

## 2022-08-03 MED ORDER — INSULIN ASPART 100 UNIT/ML IJ SOLN
2.0000 [IU] | Freq: Three times a day (TID) | INTRAMUSCULAR | Status: DC
  Administered 2022-08-03 – 2022-08-05 (×6): 2 [IU] via SUBCUTANEOUS
  Filled 2022-08-03 (×6): qty 1

## 2022-08-03 MED ORDER — FUROSEMIDE 40 MG PO TABS
40.0000 mg | ORAL_TABLET | Freq: Two times a day (BID) | ORAL | Status: AC
  Administered 2022-08-03 (×2): 40 mg via ORAL
  Filled 2022-08-03 (×2): qty 1

## 2022-08-03 NOTE — Progress Notes (Signed)
Incentive spirometer given to pt/ educated on how to use / Pt up to chair

## 2022-08-03 NOTE — Inpatient Diabetes Management (Signed)
Inpatient Diabetes Program Recommendations  AACE/ADA: New Consensus Statement on Inpatient Glycemic Control (2015)  Target Ranges:  Prepandial:   less than 140 mg/dL      Peak postprandial:   less than 180 mg/dL (1-2 hours)      Critically ill patients:  140 - 180 mg/dL   Lab Results  Component Value Date   GLUCAP 211 (H) 08/03/2022   HGBA1C 6.1 (H) 07/05/2022    Review of Glycemic Control  Latest Reference Range & Units 08/02/22 08:24 08/02/22 12:01 08/02/22 16:17 08/02/22 19:48 08/03/22 08:39  Glucose-Capillary 70 - 99 mg/dL 180 (H) 200 (H) 313 (H) 98 211 (H)   Diabetes history: DM 2 Outpatient Diabetes medications: metformin 500 mg bid Current orders for Inpatient glycemic control:  Novolog 0-9 units tid + hs  A1c 6.1% on 9/21 Solumedrol 40 mg bid  Inpatient Diabetes Program Recommendations:    Glucose trends increase after meal intake and after Solumedrol doses.  -  Consider adding Novolog 2-3 units tid meal coverage if pt eats >50% of meals.  Thanks,  Tama Headings RN, MSN, BC-ADM Inpatient Diabetes Coordinator Team Pager [phone] (8a-5p)

## 2022-08-03 NOTE — Progress Notes (Signed)
  Interdisciplinary Goals of Care Family Meeting   Date carried out: 08/03/2022  Location of the meeting: Bedside  Member's involved: Physician, Bedside Registered Nurse, Social Worker, and Family Member or next of kin  Durable Power of Attorney or acting medical decision maker: No  Discussion: We discussed goals of care for [employer] .  She wishes to be a full code. She has capacity to make decision  Code status: Full Code  Disposition: Continue current acute care  Time spent for the meeting: [place], MD  08/03/2022, 9:27 AM

## 2022-08-03 NOTE — TOC CM/SW Note (Signed)
Patient is not able to walk the distance required to go the bathroom, or he/she is unable to safely negotiate stairs required to access the bathroom.  A 3in1 BSC will alleviate this problem  

## 2022-08-03 NOTE — Progress Notes (Signed)
Occupational Therapy Treatment Patient Details Name: Suzanne Glenn MRN: [id number] DOB: 07-13-1951 Today's Date: 08/03/2022   History of present illness pt is a 71 year old female admitted with pneumonia and started on antibiotics.  Initially due to hypercarbia and hypoxia also required BiPAP.; PMH significant for COPD on 3 L nasal cannula, right upper lobe lung cancer, CAD, DM 2, GERD, HLD, HTN   OT comments  Pt seen for OT tx this date. Pt agreeable and eager to participate in ADL. Pt ambulated short distances with supervision and no AD with no LOB. OT reinforced ECS principles including activity pacing and use of PLB during exertional activity while pt completed bathing, dressing, toileting, and grooming tasks. SpO2 down to low 80's on 3L, improving with rest breaks and 4L O2 briefly. HR up to 120's with exertion. Pt noted to have some difficulty recognizing when her O2 sats were dropping and HR was up in order to take rest break. Pt encouraged to utilize pulse oximeter she has at home to help guide her need for rest breaks in order to minimize significant desats and over exertion with ADL/IADL. Pt verbalized understanding. Pt left on 3L SpO2 91%, HR in mid 80's.    Recommendations for follow up therapy are one component of a multi-disciplinary discharge planning process, led by the attending physician.  Recommendations may be updated based on patient status, additional functional criteria and insurance authorization.    Follow Up Recommendations  Home health OT    Assistance Recommended at Discharge Intermittent Supervision/Assistance  Patient can return home with the following  A little help with walking and/or transfers;A little help with bathing/dressing/bathroom   Equipment Recommendations  BSC/3in1    Recommendations for Other Services      Precautions / Restrictions Precautions Precautions: Fall Restrictions Weight Bearing Restrictions: No       Mobility Bed Mobility                General bed mobility comments: NT, in recliner    Transfers Overall transfer level: Needs assistance Equipment used: None Transfers: Sit to/from Stand Sit to Stand: Supervision           General transfer comment: without AD     Balance Overall balance assessment: Needs assistance Sitting-balance support: Feet supported Sitting balance-Leahy Scale: Good     Standing balance support: During functional activity, No upper extremity supported Standing balance-Leahy Scale: Good                             ADL either performed or assessed with clinical judgement   ADL Overall ADL's : Needs assistance/impaired     Grooming: Standing;Oral care;Wash/dry face;Supervision/safety   Upper Body Bathing: Standing;Supervision/ safety;Set up Upper Body Bathing Details (indicate cue type and reason): PRN VC for brief seated rest break and PLB Lower Body Bathing: Sitting/lateral leans;Supervison/ safety;Set up Lower Body Bathing Details (indicate cue type and reason): PRN VC for brief rest break and PLB Upper Body Dressing : Sitting;Set up   Lower Body Dressing: Sitting/lateral leans;Supervision/safety Lower Body Dressing Details (indicate cue type and reason): figure 4 technique to doff and don socks Toilet Transfer: Supervision/safety;BSC/3in1;Ambulation Toilet Transfer Details (indicate cue type and reason): assist for O2 tank mgt Toileting- Clothing Manipulation and Hygiene: Set up;Sitting/lateral lean              Extremity/Trunk Assessment              Vision  Perception     Praxis      Cognition Arousal/Alertness: Awake/alert Behavior During Therapy: WFL for tasks assessed/performed Overall Cognitive Status: Within Functional Limits for tasks assessed                                          Exercises Other Exercises Other Exercises: OT reinforced ECS principles including activity pacing and use of PLB  during exertional activity while pt completed bathing, dressing, toileting, and grooming tasks. SpO2 down to low 80's on 3L, improving with rest breaks and 4L O2 briefly. HR up to 120's with exertion. Pt left on 3L SpO2 91%, HR in mid 80's.    Shoulder Instructions       General Comments      Pertinent Vitals/ Pain       Pain Assessment Pain Assessment: No/denies pain  Home Living                                          Prior Functioning/Environment              Frequency  Min 2X/week        Progress Toward Goals  OT Goals(current goals can now be found in the care plan section)  Progress towards OT goals: Progressing toward goals  Acute Rehab OT Goals Patient Stated Goal: to go home OT Goal Formulation: With patient Time For Goal Achievement: 08/15/22 Potential to Achieve Goals: Good  Plan Discharge plan remains appropriate;Frequency remains appropriate    Co-evaluation                 AM-PAC OT "6 Clicks" Daily Activity     Outcome Measure   Help from another person eating meals?: None Help from another person taking care of personal grooming?: None Help from another person toileting, which includes using toliet, bedpan, or urinal?: None Help from another person bathing (including washing, rinsing, drying)?: A Little Help from another person to put on and taking off regular upper body clothing?: None Help from another person to put on and taking off regular lower body clothing?: None 6 Click Score: 23    End of Session Equipment Utilized During Treatment: Oxygen  OT Visit Diagnosis: Unsteadiness on feet (R26.81);Muscle weakness (generalized) (M62.81)   Activity Tolerance Patient tolerated treatment well   Patient Left in chair;with call bell/phone within reach   Nurse Communication Mobility status        Time: 9833-8250 OT Time Calculation (min): 31 min  Charges: OT General Charges $OT Visit: 1 Visit OT  Treatments $Self Care/Home Management : 23-37 mins  Ardeth Perfect., MPH, MS, OTR/L ascom [phone] 08/03/22, 3:19 PM

## 2022-08-03 NOTE — TOC Initial Note (Signed)
Transition of Care [facility]) - Initial/Assessment Note    Patient Details  Name: Suzanne Glenn MRN: [id number] Date of Birth: 1951-02-10  Transition of [facility]) CM/SW Contact:    Candie Chroman, LCSW Phone Number: 08/03/2022, 2:59 PM  Clinical Narrative:  Readmission prevention screen complete. CSW met with patient. No supports at bedside. CSW introduced role and explained that discharge planning would be discussed. PCP is Dr. Ginette Pitman. Husband drives her to appointments. Patient uses [facility]. No issues obtaining medications. No home health prior to admission although she said her PCP made a referral. She is unsure which agency referral went to. CSW called agencies that accept [employer] and none of them have the referral. CSW offered to send new referral but she declined. Encouraged her to notify TOC if she changes her mind prior to discharge or her PCP if she changes her mind after discharge. She has a RW and shower stool at home. She is agreeable to DME recommendation for a 3-in-1. Patient also has a nebulizer machine that is over 73 years old so she is requesting a new one due to having issues with it. Patient prefers [employer] since she gets her oxygen from them. Asked MD to enter DME orders. No further concerns. CSW encouraged patient to contact CSW as needed. CSW will continue to follow patient for support and facilitate return home once stable. Husband will transport her home at discharge.                Expected Discharge Plan: Home/Self Care Barriers to Discharge: Continued Medical Work up   Patient Goals and CMS Choice        Expected Discharge Plan and Services Expected Discharge Plan: Home/Self Care     Post Acute Care Choice: Durable Medical Equipment Living arrangements for the past 2 months: Single Family Home                                      Prior Living Arrangements/Services Living arrangements for the past 2 months: Single Family Home Lives with::  Spouse Patient language and need for interpreter reviewed:: Yes Do you feel safe going back to the place where you live?: Yes      Need for Family Participation in Patient Care: Yes (Comment) Care giver support system in place?: Yes (comment) Current home services: DME Criminal Activity/Legal Involvement Pertinent to Current Situation/Hospitalization: No - Comment as needed  Activities of Daily Living      Permission Sought/Granted                  Emotional Assessment Appearance:: Appears stated age Attitude/Demeanor/Rapport: Engaged, Gracious Affect (typically observed): Appropriate, Calm, Pleasant Orientation: : Oriented to Self, Oriented to Place, Oriented to  Time, Oriented to Situation Alcohol / Substance Use: Not Applicable Psych Involvement: No (comment)  Admission diagnosis:  COPD exacerbation ([place]) [J44.1] Acute respiratory failure with hypercapnia (HCC) [J96.02] Acute respiratory failure with hypoxia and hypercapnia (HCC) [J96.01, J96.02] Pneumonia of left lung due to infectious organism, unspecified part of lung [J18.9] Patient Active Problem List   Diagnosis Date Noted   Acute respiratory failure with hypoxia and hypercapnia ([place]) 07/30/2022   Lobar pneumonia ([place]) 07/30/2022   Obesity (BMI 30-39.9) 07/30/2022   Acute on chronic respiratory failure ([place]) 08/19/2021   COPD with acute exacerbation ([place]) 04/14/2021   HLD (hyperlipidemia) 04/14/2021   Diabetes mellitus without complication ([place]) 41/32/4401  Depression 04/14/2021   CAD (coronary artery disease) 04/14/2021   Iron deficiency anemia 04/14/2021   Sepsis ([place]) 04/14/2021   RLS (restless legs syndrome) 04/14/2021   Acute on chronic respiratory failure with hypoxia ([place]) 03/20/2021   Atypical pneumonia 03/19/2021   MDD (major depressive disorder), recurrent, in full remission (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[place]) 07/26/2019   Goals of care, counseling/discussion 07/26/2019    Major depressive disorder, recurrent, in partial remission ([place]) 03/27/2019   Chronic respiratory failure with hypoxia ([place]) 03/27/2019   Urge incontinence of urine 03/27/2019   Tobacco use 01/13/2019   Mass of upper lobe of left lung 01/13/2019   Symptomatic anemia 01/01/2019   Restless leg syndrome 06/30/2018   Pure hypercholesterolemia 09/26/2017   Sepsis due to pneumonia ([place]) 02/24/2017   Bilateral carotid artery stenosis 03/30/2016   Syncope and collapse 03/30/2016   Essential hypertension 08/29/2015   Controlled type 2 diabetes mellitus without complication ([place]) 88/08/314   Combined fat and carbohydrate induced hyperlipemia 06/30/2015   Clinical depression 05/05/2015   GAD (generalized anxiety disorder) 05/05/2015   Depression, major, recurrent, moderate ([place]) 05/05/2015   Benign essential HTN 04/05/2015   Diabetes mellitus, type 2 ([place]) 02/09/2015   Type 2 diabetes mellitus with hyperlipidemia ([place]) 02/09/2015   Cannot sleep 01/24/2015   Fibromyalgia 01/24/2015   CAFL (chronic airflow limitation) ([place]) 01/24/2015   H/O diabetes mellitus 01/24/2015   H/O hypercholesterolemia 01/24/2015   H/O: HTN (hypertension) 01/24/2015   Coronary artery disease 01/24/2015   Hypokalemia 01/24/2015   Arteriosclerosis of coronary artery 10/04/2014   Chest pain 10/04/2014   3-vessel CAD 08/02/2014   Acute chest pain 06/25/2014   SOB (shortness of breath) 06/25/2014   COPD, moderate ([place]) 06/08/2014   Moderate COPD (chronic obstructive pulmonary disease) ([place]) 06/08/2014   Gastro-esophageal reflux disease without esophagitis 05/31/2014   PCP:  Tracie Harrier, MD Pharmacy:   [facility], [address] [place] [address] Phone: [phone] Fax: [phone]     Social Determinants of Health (SDOH) Interventions    Readmission Risk Interventions    08/03/2022    2:49 PM 08/22/2021   12:19 PM 08/20/2021    2:50 PM  Readmission  Risk Prevention Plan  Transportation Screening Complete Complete Complete  PCP or Specialist Appt within 3-5 Days Complete  Complete  HRI or [place] Patient refused  Complete  Social Work Consult for [place] Planning/Counseling Complete  Complete  Palliative Care Screening Not Applicable  Not Applicable  Medication Review Press photographer) Complete  Complete  SW Recovery Care/Counseling Consult  Complete   Palliative Care Screening  Not [place]  Not Applicable

## 2022-08-03 NOTE — Progress Notes (Signed)
PROGRESS NOTE    Suzanne Glenn  TDV:[id number] DOB: November 15, 1950 DOA: 07/30/2022 PCP: Tracie Harrier, MD   Brief Narrative:  71 year old with history of COPD on 3 L nasal cannula, right upper lobe lung cancer, CAD, DM 2, GERD, HLD, HTN brought to the hospital for respiratory distress.  Patient was diagnosed with pneumonia and started on antibiotics. Initially due to hypercarbia and hypoxia also required BiPAP.  Critical care team was consulted.  Patient slowly did well with bronchodilators and diuresis.  She was transferred out of the ICU.  Initially wanted to be DNR then change her CODE STATUS back to full.   Assessment & Plan:  Principal Problem:   Acute respiratory failure with hypoxia and hypercapnia (HCC) Active Problems:   Lobar pneumonia (HCC)   COPD with acute exacerbation (HCC)   Essential hypertension   Type 2 diabetes mellitus with hyperlipidemia (HCC)   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HCC)   Obesity (BMI 30-39.9)   * Acute respiratory failure with hypoxia and hypercapnia (HCC) - 3 L nasal cannula but gets very short of breath with minimal exertion.  Has abnormal breath sounds.  Getting bronchodilators scheduled and as necessary.  I-S/flutter valve.  Out of bed to chair   Lobar pneumonia ([place]) Left lobar pneumonia.   -Viral respiratory panel , COVID, influenza a and B  are all negative  -On Rocephin and doxycycline.  Planned 5-day course   COPD with acute exacerbation ([place]) - Likely exacerbated by underlying pneumonia.  Continue Solu-Medrol IV twice daily, bronchodilators  Acute congestive heart failure with reduced ejection fraction - Echo in March 2023 showed EF of 45-50%, grade 1 DD, normal RV function.  Subtle elevated BNP, will place her on Lasix 40 mg twice daily X2 doses and reassess tomorrow   Obesity (BMI 30-39.9) BMI 34.33 with current height and weight in computer.   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[place]) Follow-up as outpatient.   Type 2  diabetes mellitus with hyperlipidemia (HCC) We will place on sliding scale insulin and continue pravastatin.  With steroid sugars will be higher.  Recent hemoglobin A1c 6.1.   Essential hypertension Currently on Lopressor twice daily, Aldactone.  IV as needed   History of Parkinson's - On carbidopa/levodopa   PT/OT= Home health, orders placed She wishes to be full code, note written separately  DVT prophylaxis: Lovenox Code Status: Full code Family Communication: Husband present at bedside  Status is: Inpatient Still has abnormal breath sounds.  Maintain hospital stay for p.o. diuretics, IV steroids and bronchodilators  Subjective: Still feeling short of breath with movement.  Some cough.  Overall slightly better compared to yesterday   Examination: Constitutional: Not in acute distress, 3 L nasal cannula Respiratory: Diffuse bilateral diminished breath sounds Cardiovascular: Normal sinus rhythm, no rubs Abdomen: Nontender nondistended good bowel sounds Musculoskeletal: No edema noted Skin: No rashes seen Neurologic: CN 2-12 grossly intact.  And nonfocal Psychiatric: Normal judgment and insight. Alert and oriented x 3. Normal mood. Objective: Vitals:   08/03/22 0023 08/03/22 0253 08/03/22 0424 08/03/22 0814  BP: (!) 153/106  125/84 108/64  Pulse: (!) 103  (!) 102 (!) 105  Resp: 20  20 14   Temp: 97.7 F (36.5 C)  98 F (36.7 C) 97.9 F (36.6 C)  TempSrc: Oral  Oral   SpO2: 97% 92% 93% 93%  Weight:      Height:        Intake/Output Summary (Last 24 hours) at 08/03/2022 1116 Last data filed  at 08/02/2022 2156 Gross per 24 hour  Intake --  Output 400 ml  Net -400 ml   Filed Weights   07/30/22 0729 07/30/22 1045  Weight: 87.9 kg 84.9 kg     Data Reviewed:   CBC: Recent Labs  Lab 07/30/22 0739 07/31/22 0412 08/01/22 0320 08/01/22 0930 08/02/22 0401  WBC 10.7* 9.9 12.0* 13.0* 9.8  NEUTROABS 8.3*  --  9.8*  --   --   HGB 11.2* 9.6* 10.1* 10.4* 10.6*   HCT 39.7 32.7* 34.2* 34.8* 35.7*  MCV 102.3* 100.6* 98.8 98.6 98.3  PLT 278 229 286 315 517   Basic Metabolic Panel: Recent Labs  Lab 07/30/22 0739 07/30/22 0943 07/31/22 0412 08/01/22 0320 08/02/22 0401 08/03/22 0440  NA 141  --  141 143 141 137  K 4.2  --  4.0 3.8 4.9 3.7  CL 92*  --  93* 93* 88* 84*  CO2 40*  --  39* 42* 45* 41*  GLUCOSE 235*  --  145* 127* 182* 189*  BUN 19  --  28* 30* 33* 31*  CREATININE 0.69  --  0.96 0.69 0.72 0.73  CALCIUM 9.4  --  9.2 9.1 9.1 8.9  MG  --  2.4  --   --  2.0 1.7   GFR: Estimated Creatinine Clearance: 67.6 mL/min (by C-G formula based on SCr of 0.73 mg/dL). Liver Function Tests: Recent Labs  Lab 07/30/22 0739 08/01/22 0320  AST 15 13*  ALT 13 <5  ALKPHOS 86 67  BILITOT 0.5 0.3  PROT 7.7 6.3*  ALBUMIN 3.5 2.8*   No results for input(s): "LIPASE", "AMYLASE" in the last 168 hours. No results for input(s): "AMMONIA" in the last 168 hours. Coagulation Profile: Recent Labs  Lab 07/30/22 0739  INR 1.1   Cardiac Enzymes: No results for input(s): "CKTOTAL", "CKMB", "CKMBINDEX", "TROPONINI" in the last 168 hours. BNP (last 3 results) No results for input(s): "PROBNP" in the last 8760 hours. HbA1C: No results for input(s): "HGBA1C" in the last 72 hours. CBG: Recent Labs  Lab 08/02/22 0824 08/02/22 1201 08/02/22 1617 08/02/22 1948 08/03/22 0839  GLUCAP 180* 200* 313* 98 211*   Lipid Profile: No results for input(s): "CHOL", "HDL", "LDLCALC", "TRIG", "CHOLHDL", "LDLDIRECT" in the last 72 hours. Thyroid Function Tests: No results for input(s): "TSH", "T4TOTAL", "FREET4", "T3FREE", "THYROIDAB" in the last 72 hours. Anemia Panel: No results for input(s): "VITAMINB12", "FOLATE", "FERRITIN", "TIBC", "IRON", "RETICCTPCT" in the last 72 hours. Sepsis Labs: Recent Labs  Lab 07/30/22 0740 07/30/22 1720 07/31/22 0412 08/01/22 0320  PROCALCITON <0.10  --  0.71 0.43  LATICACIDVEN 1.3 1.0  --   --     Recent Results (from  the past 240 hour(s))  Culture, blood (Routine x 2)     Status: None (Preliminary result)   Collection Time: 07/30/22  7:39 AM   Specimen: BLOOD  Result Value Ref Range Status   Specimen Description BLOOD LEFT WRIST  Final   Special Requests   Final    BOTTLES DRAWN AEROBIC AND ANAEROBIC Blood Culture adequate volume   Culture   Final    NO GROWTH 4 DAYS Performed at [facility], [address] [place], [place] [address]    Report Status PENDING  Incomplete  Culture, blood (Routine x 2)     Status: None (Preliminary result)   Collection Time: 07/30/22  7:40 AM   Specimen: BLOOD  Result Value Ref Range Status   Specimen Description BLOOD LEFT AC  Final   Special Requests   Final    BOTTLES DRAWN AEROBIC AND ANAEROBIC Blood Culture adequate volume   Culture   Final    NO GROWTH 4 DAYS Performed at [facility], [address] [place], [place] [address]    Report Status PENDING  Incomplete  Resp Panel by RT-PCR (Flu A&B, Covid) Anterior Nasal Swab     Status: None   Collection Time: 07/30/22 10:00 AM   Specimen: Anterior Nasal Swab  Result Value Ref Range Status   SARS Coronavirus 2 by RT PCR NEGATIVE NEGATIVE Final    Comment: (NOTE) SARS-CoV-2 target nucleic acids are NOT DETECTED.  The SARS-CoV-2 RNA is generally detectable in upper respiratory specimens during the acute phase of infection. The lowest concentration of SARS-CoV-2 viral copies this assay can detect is 138 copies/mL. A negative result does not preclude SARS-Cov-2 infection and should not be used as the sole basis for treatment or other patient management decisions. A negative result may occur with  improper specimen collection/handling, submission of specimen other than nasopharyngeal swab, presence of viral mutation(s) within the areas targeted by this assay, and inadequate number of viral copies(<138 copies/mL). A negative result must be combined with clinical observations, patient  history, and epidemiological information. The expected result is Negative.  Fact Sheet for Patients:  EntrepreneurPulse.com.au  Fact Sheet for Healthcare Providers:  IncredibleEmployment.be  This test is no t yet approved or cleared by the [place] FDA and  has been authorized for detection and/or diagnosis of SARS-CoV-2 by FDA under an Emergency Use Authorization (EUA). This EUA will remain  in effect (meaning this test can be used) for the duration of the COVID-19 declaration under Section 564(b)(1) of the Act, 21 U.S.C.section 360bbb-3(b)(1), unless the authorization is terminated  or revoked sooner.       Influenza A by PCR NEGATIVE NEGATIVE Final   Influenza B by PCR NEGATIVE NEGATIVE Final    Comment: (NOTE) The Xpert Xpress SARS-CoV-2/FLU/RSV plus assay is intended as an aid in the diagnosis of influenza from Nasopharyngeal swab specimens and should not be used as a sole basis for treatment. Nasal washings and aspirates are unacceptable for Xpert Xpress SARS-CoV-2/FLU/RSV testing.  Fact Sheet for Patients: EntrepreneurPulse.com.au  Fact Sheet for Healthcare Providers: IncredibleEmployment.be  This test is not yet approved or cleared by the [place] FDA and has been authorized for detection and/or diagnosis of SARS-CoV-2 by FDA under an Emergency Use Authorization (EUA). This EUA will remain in effect (meaning this test can be used) for the duration of the COVID-19 declaration under Section 564(b)(1) of the Act, 21 U.S.C. section 360bbb-3(b)(1), unless the authorization is terminated or revoked.  Performed at [facility], [place], [place] [address]   Respiratory (~20 pathogens) panel by PCR     Status: None   Collection Time: 07/30/22 10:58 AM   Specimen: Nasopharyngeal Swab; Respiratory  Result Value Ref Range Status   Adenovirus NOT DETECTED NOT DETECTED  Final   Coronavirus 229E NOT DETECTED NOT DETECTED Final    Comment: (NOTE) The Coronavirus on the Respiratory Panel, DOES NOT test for the novel  Coronavirus (2019 nCoV)    Coronavirus HKU1 NOT DETECTED NOT DETECTED Final   Coronavirus NL63 NOT DETECTED NOT DETECTED Final   Coronavirus OC43 NOT DETECTED NOT DETECTED Final   Metapneumovirus NOT DETECTED NOT DETECTED Final   Rhinovirus / Enterovirus NOT DETECTED NOT DETECTED Final   Influenza A NOT DETECTED NOT DETECTED Final  Influenza B NOT DETECTED NOT DETECTED Final   Parainfluenza Virus 1 NOT DETECTED NOT DETECTED Final   Parainfluenza Virus 2 NOT DETECTED NOT DETECTED Final   Parainfluenza Virus 3 NOT DETECTED NOT DETECTED Final   Parainfluenza Virus 4 NOT DETECTED NOT DETECTED Final   Respiratory Syncytial Virus NOT DETECTED NOT DETECTED Final   Bordetella pertussis NOT DETECTED NOT DETECTED Final   Bordetella Parapertussis NOT DETECTED NOT DETECTED Final   Chlamydophila pneumoniae NOT DETECTED NOT DETECTED Final   Mycoplasma pneumoniae NOT DETECTED NOT DETECTED Final    Comment: Performed at [facility], [place] [address] [place], [place] [address]  MRSA Next Gen by PCR, Nasal     Status: None   Collection Time: 07/30/22 10:58 AM   Specimen: Nasal Mucosa; Nasal Swab  Result Value Ref Range Status   MRSA by PCR Next Gen NOT DETECTED NOT DETECTED Final    Comment: (NOTE) The GeneXpert MRSA Assay (FDA approved for NASAL specimens only), is one component of a comprehensive MRSA colonization surveillance program. It is not intended to diagnose MRSA infection nor to guide or monitor treatment for MRSA infections. Test performance is not FDA approved in patients less than 12 years old. Performed at [facility], [address] [place], [place] [address]   Expectorated Sputum Assessment w Gram Stain, Rflx to Resp Cult     Status: None   Collection Time: 07/30/22 12:30 PM   Specimen: Sputum  Result Value Ref  Range Status   Specimen Description SPUTUM  Final   Special Requests NONE  Final   Sputum evaluation   Final    THIS SPECIMEN IS ACCEPTABLE FOR SPUTUM CULTURE Performed at [facility], [address] [place], [place] [address]    Report Status 07/30/2022 FINAL  Final  Culture, Respiratory w Gram Stain     Status: None (Preliminary result)   Collection Time: 07/30/22 12:30 PM   Specimen: SPU  Result Value Ref Range Status   Specimen Description   Final    SPUTUM Performed at [facility], [address] [place], [place] [address]    Special Requests   Final    NONE Reflexed from [id number] Performed at [facility], [address] [place], [place] [address]    Gram Stain   Final    FEW WBC PRESENT, PREDOMINANTLY MONONUCLEAR MODERATE GRAM POSITIVE COCCI IN PAIRS MODERATE GRAM POSITIVE COCCI IN CLUSTERS MODERATE GRAM NEGATIVE RODS    Culture   Final    CULTURE REINCUBATED FOR BETTER GROWTH Performed at [facility], [place] [address], [place], [name] [address]    Report Status PENDING  Incomplete         Radiology Studies: No results found.      Scheduled Meds:  aspirin  81 mg Oral Daily   budesonide (PULMICORT) nebulizer solution  0.25 mg Nebulization BID   carbidopa-levodopa  1 tablet Oral TID   Chlorhexidine Gluconate Cloth  6 each Topical Daily   doxycycline  100 mg Oral Q12H   enoxaparin (LOVENOX) injection  0.5 mg/kg Subcutaneous Q24H   furosemide  40 mg Oral BID   gabapentin  300 mg Oral TID   insulin aspart  0-5 Units Subcutaneous QHS   insulin aspart  0-9 Units Subcutaneous TID WC   insulin aspart  2 Units Subcutaneous TID WC   ipratropium-albuterol  3 mL Nebulization Q6H   isosorbide mononitrate  30 mg Oral Daily   methylPREDNISolone (SOLU-MEDROL) injection  40 mg Intravenous BID   metoprolol tartrate  12.5 mg Oral BID   mirtazapine  7.5 mg Oral QHS   pantoprazole  40 mg Oral Daily   pravastatin  80 mg Oral QPM    sodium chloride flush  3 mL Intravenous Q12H   spironolactone  25 mg Oral Daily   venlafaxine XR  225 mg Oral Daily   Continuous Infusions:  cefTRIAXone (ROCEPHIN)  IV 2 g (08/02/22 1559)     LOS: 4 days   Time spent= 35 mins    Chayla Shands Arsenio Loader, MD [facility]  If 7PM-7AM, please contact night-coverage  08/03/2022, 11:16 AM

## 2022-08-04 DIAGNOSIS — J9602 Acute respiratory failure with hypercapnia: Secondary | ICD-10-CM | POA: Diagnosis not present

## 2022-08-04 DIAGNOSIS — J9601 Acute respiratory failure with hypoxia: Secondary | ICD-10-CM | POA: Diagnosis not present

## 2022-08-04 LAB — BASIC METABOLIC PANEL
Anion gap: 10 (ref 5–15)
BUN: 34 mg/dL — ABNORMAL HIGH (ref 8–23)
CO2: 42 mmol/L — ABNORMAL HIGH (ref 22–32)
Calcium: 8.9 mg/dL (ref 8.9–10.3)
Chloride: 86 mmol/L — ABNORMAL LOW (ref 98–111)
Creatinine, Ser: 0.85 mg/dL (ref 0.44–1.00)
GFR, Estimated: >60 mL/min (ref >60)
Glucose, Bld: 214 mg/dL — ABNORMAL HIGH (ref 70–99)
Potassium: 4 mmol/L (ref 3.5–5.1)
Sodium: 138 mmol/L (ref 135–145)

## 2022-08-04 LAB — CULTURE, BLOOD (ROUTINE X 2)
Culture: NO GROWTH
Culture: NO GROWTH
Special Requests: ADEQUATE
Special Requests: ADEQUATE

## 2022-08-04 LAB — GLUCOSE, CAPILLARY
Glucose-Capillary: 189 mg/dL — ABNORMAL HIGH (ref 70–99)
Glucose-Capillary: 240 mg/dL — ABNORMAL HIGH (ref 70–99)
Glucose-Capillary: 273 mg/dL — ABNORMAL HIGH (ref 70–99)
Glucose-Capillary: 165 mg/dL — ABNORMAL HIGH (ref 70–99)

## 2022-08-04 LAB — CBC
HCT: 34.9 % — ABNORMAL LOW (ref 36.0–46.0)
Hemoglobin: 10.9 g/dL — ABNORMAL LOW (ref 12.0–15.0)
MCH: 29.5 pg (ref 26.0–34.0)
MCHC: 31.2 g/dL (ref 30.0–36.0)
MCV: 94.3 fL (ref 80.0–100.0)
Platelets: 417 10*3/uL — ABNORMAL HIGH (ref 150–400)
RBC: 3.7 MIL/uL — ABNORMAL LOW (ref 3.87–5.11)
RDW: 13.9 % (ref 11.5–15.5)
WBC: 15.8 10*3/uL — ABNORMAL HIGH (ref 4.0–10.5)
nRBC: 0.3 % — ABNORMAL HIGH (ref 0.0–0.2)

## 2022-08-04 LAB — MAGNESIUM: Magnesium: 1.6 mg/dL — ABNORMAL LOW (ref 1.7–2.4)

## 2022-08-04 MED ORDER — ALBUTEROL SULFATE (2.5 MG/3ML) 0.083% IN NEBU: 2.5000 mg | INHALATION_SOLUTION | RESPIRATORY_TRACT | Status: DC | PRN

## 2022-08-04 MED ORDER — INSULIN GLARGINE-YFGN 100 UNIT/ML ~~LOC~~ SOLN
5.0000 [IU] | Freq: Every day | SUBCUTANEOUS | Status: DC
  Administered 2022-08-04 – 2022-08-05 (×2): 5 [IU] via SUBCUTANEOUS
  Filled 2022-08-04 (×2): qty 0.05

## 2022-08-04 NOTE — Plan of Care (Signed)

## 2022-08-04 NOTE — Progress Notes (Signed)
2000 patient alert able to make all needs known uses [facility] when needed

## 2022-08-04 NOTE — Progress Notes (Signed)
PROGRESS NOTE    Suzanne Glenn  PYK:[id number] DOB: 1950/12/30 DOA: 07/30/2022 PCP: Tracie Harrier, MD   Brief Narrative:  71 year old with history of COPD on 3 L nasal cannula, right upper lobe lung cancer, CAD, DM 2, GERD, HLD, HTN brought to the hospital for respiratory distress.  Patient was diagnosed with pneumonia and started on antibiotics. Initially due to hypercarbia and hypoxia also required BiPAP.  Critical care team was consulted.  Patient slowly did well with bronchodilators and diuresis.  She was transferred out of the ICU.  Initially wanted to be DNR then change her CODE STATUS back to full.   Assessment & Plan:  Principal Problem:   Acute respiratory failure with hypoxia and hypercapnia (HCC) Active Problems:   Lobar pneumonia (HCC)   COPD with acute exacerbation (HCC)   Essential hypertension   Type 2 diabetes mellitus with hyperlipidemia (HCC)   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HCC)   Obesity (BMI 30-39.9)   * Acute respiratory failure with hypoxia and hypercapnia (HCC) - 3 L nasal cannula but gets very short of breath with minimal exertion.  Has abnormal breath sounds.  Getting bronchodilators, I-S/flutter valve.  Out of bed to chair.  Still gets very short of breath with minimal ambulation   Lobar pneumonia ([place]) Left lobar pneumonia.   -Viral respiratory panel , COVID, influenza a and B  are all negative  -On Rocephin and doxycycline; completed 5 days 10/20   COPD with acute exacerbation ([place]) - Likely exacerbated by underlying pneumonia.  Continue Solu-Medrol IV twice daily, bronchodilators  Acute congestive heart failure with reduced ejection fraction - Echo in March 2023 showed EF of 45-50%, grade 1 DD, normal RV function.  Intermittently getting Lasix.  We will recheck BNP but today clinically looks well dehydrated along with her labs therefore holding off on Lasix.   Obesity (BMI 30-39.9) BMI 34.33 with current height and weight in  computer.   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[place]) Follow-up as outpatient.   Type 2 diabetes mellitus with hyperlipidemia (HCC) Recent hemoglobin A1c 6.1.  Add Semglee 5 units daily, continue NovoLog 2 units Premeal, sliding scale Continue pravastatin   Essential hypertension Currently on Lopressor twice daily, Aldactone.  IV as needed   History of Parkinson's - On carbidopa/levodopa   PT/OT= Home health, orders placed She wishes to be full code, note written separately  DVT prophylaxis: Lovenox Code Status: Full code Family Communication: Husband present at bedside  Status is: Inpatient Patient is still getting short of breath with minimal mobility therefore will remain in the hospital until this improves  Subjective: Still feeling short of breath with movement.  Some cough.  Overall slightly better compared to yesterday   Examination: Constitutional: Not in acute distress, 3 L nasal cannula Respiratory: Diffuse diminished breath sounds bilaterally Cardiovascular: Normal sinus rhythm, no rubs Abdomen: Nontender nondistended good bowel sounds Musculoskeletal: No edema noted Skin: No rashes seen Neurologic: CN 2-12 grossly intact.  And nonfocal Psychiatric: Normal judgment and insight. Alert and oriented x 3. Normal mood. Objective: Vitals:   08/04/22 0722 08/04/22 0738 08/04/22 0751 08/04/22 0839  BP: (!) 160/77     Pulse: (!) 108 (!) 113    Resp: 18 20    Temp: 97.9 F (36.6 C)     TempSrc:      SpO2: 94% (!) 89% 94% 95%  Weight:      Height:       No intake or output data in the  24 hours ending 08/04/22 0905  Filed Weights   07/30/22 0729 07/30/22 1045  Weight: 87.9 kg 84.9 kg     Data Reviewed:   CBC: Recent Labs  Lab 07/30/22 0739 07/31/22 0412 08/01/22 0320 08/01/22 0930 08/02/22 0401 08/04/22 0452  WBC 10.7* 9.9 12.0* 13.0* 9.8 15.8*  NEUTROABS 8.3*  --  9.8*  --   --   --   HGB 11.2* 9.6* 10.1* 10.4* 10.6* 10.9*  HCT 39.7 32.7*  34.2* 34.8* 35.7* 34.9*  MCV 102.3* 100.6* 98.8 98.6 98.3 94.3  PLT 278 229 286 315 336 681*   Basic Metabolic Panel: Recent Labs  Lab 07/30/22 0943 07/31/22 0412 08/01/22 0320 08/02/22 0401 08/03/22 0440 08/04/22 0452  NA  --  141 143 141 137 138  K  --  4.0 3.8 4.9 3.7 4.0  CL  --  93* 93* 88* 84* 86*  CO2  --  39* 42* 45* 41* 42*  GLUCOSE  --  145* 127* 182* 189* 214*  BUN  --  28* 30* 33* 31* 34*  CREATININE  --  0.96 0.69 0.72 0.73 0.85  CALCIUM  --  9.2 9.1 9.1 8.9 8.9  MG 2.4  --   --  2.0 1.7 1.6*   GFR: Estimated Creatinine Clearance: 63.6 mL/min (by C-G formula based on SCr of 0.85 mg/dL). Liver Function Tests: Recent Labs  Lab 07/30/22 0739 08/01/22 0320  AST 15 13*  ALT 13 <5  ALKPHOS 86 67  BILITOT 0.5 0.3  PROT 7.7 6.3*  ALBUMIN 3.5 2.8*   No results for input(s): "LIPASE", "AMYLASE" in the last 168 hours. No results for input(s): "AMMONIA" in the last 168 hours. Coagulation Profile: Recent Labs  Lab 07/30/22 0739  INR 1.1   Cardiac Enzymes: No results for input(s): "CKTOTAL", "CKMB", "CKMBINDEX", "TROPONINI" in the last 168 hours. BNP (last 3 results) No results for input(s): "PROBNP" in the last 8760 hours. HbA1C: No results for input(s): "HGBA1C" in the last 72 hours. CBG: Recent Labs  Lab 08/03/22 0839 08/03/22 1203 08/03/22 1611 08/03/22 2101 08/04/22 0728  GLUCAP 211* 282* 156* 202* 189*   Lipid Profile: No results for input(s): "CHOL", "HDL", "LDLCALC", "TRIG", "CHOLHDL", "LDLDIRECT" in the last 72 hours. Thyroid Function Tests: No results for input(s): "TSH", "T4TOTAL", "FREET4", "T3FREE", "THYROIDAB" in the last 72 hours. Anemia Panel: No results for input(s): "VITAMINB12", "FOLATE", "FERRITIN", "TIBC", "IRON", "RETICCTPCT" in the last 72 hours. Sepsis Labs: Recent Labs  Lab 07/30/22 0740 07/30/22 1720 07/31/22 0412 08/01/22 0320  PROCALCITON <0.10  --  0.71 0.43  LATICACIDVEN 1.3 1.0  --   --     Recent Results  (from the past 240 hour(s))  Culture, blood (Routine x 2)     Status: None (Preliminary result)   Collection Time: 07/30/22  7:39 AM   Specimen: BLOOD  Result Value Ref Range Status   Specimen Description BLOOD LEFT WRIST  Final   Special Requests   Final    BOTTLES DRAWN AEROBIC AND ANAEROBIC Blood Culture adequate volume   Culture   Final    NO GROWTH 4 DAYS Performed at [facility], [address] [place], [place] [address]    Report Status PENDING  Incomplete  Culture, blood (Routine x 2)     Status: None (Preliminary result)   Collection Time: 07/30/22  7:40 AM   Specimen: BLOOD  Result Value Ref Range Status   Specimen Description BLOOD LEFT [facility]  Final   Special Requests  Final    BOTTLES DRAWN AEROBIC AND ANAEROBIC Blood Culture adequate volume   Culture   Final    NO GROWTH 4 DAYS Performed at [facility], [address] [place], [place] [address]    Report Status PENDING  Incomplete  Resp Panel by RT-PCR (Flu A&B, Covid) Anterior Nasal Swab     Status: None   Collection Time: 07/30/22 10:00 AM   Specimen: Anterior Nasal Swab  Result Value Ref Range Status   SARS Coronavirus 2 by RT PCR NEGATIVE NEGATIVE Final    Comment: (NOTE) SARS-CoV-2 target nucleic acids are NOT DETECTED.  The SARS-CoV-2 RNA is generally detectable in upper respiratory specimens during the acute phase of infection. The lowest concentration of SARS-CoV-2 viral copies this assay can detect is 138 copies/mL. A negative result does not preclude SARS-Cov-2 infection and should not be used as the sole basis for treatment or other patient management decisions. A negative result may occur with  improper specimen collection/handling, submission of specimen other than nasopharyngeal swab, presence of viral mutation(s) within the areas targeted by this assay, and inadequate number of viral copies(<138 copies/mL). A negative result must be combined with clinical observations,  patient history, and epidemiological information. The expected result is Negative.  Fact Sheet for Patients:  EntrepreneurPulse.com.au  Fact Sheet for Healthcare Providers:  IncredibleEmployment.be  This test is no t yet approved or cleared by the [place] FDA and  has been authorized for detection and/or diagnosis of SARS-CoV-2 by FDA under an Emergency Use Authorization (EUA). This EUA will remain  in effect (meaning this test can be used) for the duration of the COVID-19 declaration under Section 564(b)(1) of the Act, 21 U.S.C.section 360bbb-3(b)(1), unless the authorization is terminated  or revoked sooner.       Influenza A by PCR NEGATIVE NEGATIVE Final   Influenza B by PCR NEGATIVE NEGATIVE Final    Comment: (NOTE) The Xpert Xpress SARS-CoV-2/FLU/RSV plus assay is intended as an aid in the diagnosis of influenza from Nasopharyngeal swab specimens and should not be used as a sole basis for treatment. Nasal washings and aspirates are unacceptable for Xpert Xpress SARS-CoV-2/FLU/RSV testing.  Fact Sheet for Patients: EntrepreneurPulse.com.au  Fact Sheet for Healthcare Providers: IncredibleEmployment.be  This test is not yet approved or cleared by the [place] FDA and has been authorized for detection and/or diagnosis of SARS-CoV-2 by FDA under an Emergency Use Authorization (EUA). This EUA will remain in effect (meaning this test can be used) for the duration of the COVID-19 declaration under Section 564(b)(1) of the Act, 21 U.S.C. section 360bbb-3(b)(1), unless the authorization is terminated or revoked.  Performed at [facility], [place], [place] [address]   Respiratory (~20 pathogens) panel by PCR     Status: None   Collection Time: 07/30/22 10:58 AM   Specimen: Nasopharyngeal Swab; Respiratory  Result Value Ref Range Status   Adenovirus NOT DETECTED NOT  DETECTED Final   Coronavirus 229E NOT DETECTED NOT DETECTED Final    Comment: (NOTE) The Coronavirus on the Respiratory Panel, DOES NOT test for the novel  Coronavirus (2019 nCoV)    Coronavirus HKU1 NOT DETECTED NOT DETECTED Final   Coronavirus NL63 NOT DETECTED NOT DETECTED Final   Coronavirus OC43 NOT DETECTED NOT DETECTED Final   Metapneumovirus NOT DETECTED NOT DETECTED Final   Rhinovirus / Enterovirus NOT DETECTED NOT DETECTED Final   Influenza A NOT DETECTED NOT DETECTED Final   Influenza B NOT DETECTED NOT DETECTED  Final   Parainfluenza Virus 1 NOT DETECTED NOT DETECTED Final   Parainfluenza Virus 2 NOT DETECTED NOT DETECTED Final   Parainfluenza Virus 3 NOT DETECTED NOT DETECTED Final   Parainfluenza Virus 4 NOT DETECTED NOT DETECTED Final   Respiratory Syncytial Virus NOT DETECTED NOT DETECTED Final   Bordetella pertussis NOT DETECTED NOT DETECTED Final   Bordetella Parapertussis NOT DETECTED NOT DETECTED Final   Chlamydophila pneumoniae NOT DETECTED NOT DETECTED Final   Mycoplasma pneumoniae NOT DETECTED NOT DETECTED Final    Comment: Performed at [facility], [place] [address], [place], [place] [address]  MRSA Next Gen by PCR, Nasal     Status: None   Collection Time: 07/30/22 10:58 AM   Specimen: Nasal Mucosa; Nasal Swab  Result Value Ref Range Status   MRSA by PCR Next Gen NOT DETECTED NOT DETECTED Final    Comment: (NOTE) The GeneXpert MRSA Assay (FDA approved for NASAL specimens only), is one component of a comprehensive MRSA colonization surveillance program. It is not intended to diagnose MRSA infection nor to guide or monitor treatment for MRSA infections. Test performance is not FDA approved in patients less than 35 years old. Performed at [facility], [address] [place], [place] [address]   Expectorated Sputum Assessment w Gram Stain, Rflx to Resp Cult     Status: None   Collection Time: 07/30/22 12:30 PM   Specimen: Sputum  Result  Value Ref Range Status   Specimen Description SPUTUM  Final   Special Requests NONE  Final   Sputum evaluation   Final    THIS SPECIMEN IS ACCEPTABLE FOR SPUTUM CULTURE Performed at [facility], [address], [place], [place] [address]    Report Status 07/30/2022 FINAL  Final  Culture, Respiratory w Gram Stain     Status: None   Collection Time: 07/30/22 12:30 PM   Specimen: SPU  Result Value Ref Range Status   Specimen Description   Final    SPUTUM Performed at [facility], [address], [place], [place] [address]    Special Requests   Final    NONE Reflexed from [id number] Performed at [facility], [address] [place], [place] [address]    Gram Stain   Final    FEW WBC PRESENT, PREDOMINANTLY MONONUCLEAR MODERATE GRAM POSITIVE COCCI IN PAIRS MODERATE GRAM POSITIVE COCCI IN CLUSTERS MODERATE GRAM NEGATIVE RODS    Culture   Final    MODERATE Normal respiratory flora-no Staph aureus or Pseudomonas seen Performed at [facility], [place] [address], [place],  [address]    Report Status 08/03/2022 FINAL  Final         Radiology Studies: No results found.      Scheduled Meds:  aspirin  81 mg Oral Daily   budesonide (PULMICORT) nebulizer solution  0.25 mg Nebulization BID   carbidopa-levodopa  1 tablet Oral TID   Chlorhexidine Gluconate Cloth  6 each Topical Daily   enoxaparin (LOVENOX) injection  0.5 mg/kg Subcutaneous Q24H   gabapentin  300 mg Oral TID   insulin aspart  0-5 Units Subcutaneous QHS   insulin aspart  0-9 Units Subcutaneous TID WC   insulin aspart  2 Units Subcutaneous TID WC   ipratropium-albuterol  3 mL Nebulization Q6H   isosorbide mononitrate  30 mg Oral Daily   methylPREDNISolone (SOLU-MEDROL) injection  40 mg Intravenous BID   metoprolol tartrate  12.5 mg Oral BID   mirtazapine  7.5  mg Oral QHS   pantoprazole  40 mg Oral Daily   pravastatin  80 mg Oral QPM   sodium chloride flush  3 mL  Intravenous Q12H   spironolactone  25 mg Oral Daily   venlafaxine XR  225 mg Oral Daily   Continuous Infusions:     LOS: 5 days   Time spent= 35 mins    Suzanne Delafuente Arsenio Loader, MD [facility]  If 7PM-7AM, please contact night-coverage  08/04/2022, 9:05 AM

## 2022-08-05 DIAGNOSIS — J9601 Acute respiratory failure with hypoxia: Secondary | ICD-10-CM | POA: Diagnosis not present

## 2022-08-05 DIAGNOSIS — J9602 Acute respiratory failure with hypercapnia: Secondary | ICD-10-CM | POA: Diagnosis not present

## 2022-08-05 LAB — BASIC METABOLIC PANEL
Anion gap: 9 (ref 5–15)
BUN: 32 mg/dL — ABNORMAL HIGH (ref 8–23)
CO2: 41 mmol/L — ABNORMAL HIGH (ref 22–32)
Calcium: 9.3 mg/dL (ref 8.9–10.3)
Chloride: 91 mmol/L — ABNORMAL LOW (ref 98–111)
Creatinine, Ser: 0.83 mg/dL (ref 0.44–1.00)
GFR, Estimated: >60 mL/min (ref >60)
Glucose, Bld: 221 mg/dL — ABNORMAL HIGH (ref 70–99)
Potassium: 5 mmol/L (ref 3.5–5.1)
Sodium: 141 mmol/L (ref 135–145)

## 2022-08-05 LAB — GLUCOSE, CAPILLARY
Glucose-Capillary: 144 mg/dL — ABNORMAL HIGH (ref 70–99)
Glucose-Capillary: 146 mg/dL — ABNORMAL HIGH (ref 70–99)

## 2022-08-05 LAB — BRAIN NATRIURETIC PEPTIDE: B Natriuretic Peptide: 219 pg/mL — ABNORMAL HIGH (ref 0.0–100.0)

## 2022-08-05 LAB — MAGNESIUM: Magnesium: 1.9 mg/dL (ref 1.7–2.4)

## 2022-08-05 MED ORDER — PREDNISONE 10 MG PO TABS: 50.0000 mg | ORAL_TABLET | Freq: Every day | ORAL | 0 refills | Status: AC

## 2022-08-05 MED ORDER — ENOXAPARIN SODIUM 40 MG/0.4ML IJ SOSY: 40.0000 mg | PREFILLED_SYRINGE | INTRAMUSCULAR | Status: DC

## 2022-08-05 NOTE — Progress Notes (Signed)
Physical Therapy Treatment Patient Details Name: Suzanne Glenn MRN: [id number] DOB: 1950-11-29 Today's Date: 08/05/2022   History of Present Illness pt is a 71 year old female admitted with pneumonia and started on antibiotics.  Initially due to hypercarbia and hypoxia also required BiPAP.; PMH significant for COPD on 3 L nasal cannula, right upper lobe lung cancer, CAD, DM 2, GERD, HLD, HTN    PT Comments    Pt ready for session.  OOB with ease.  She is able to stand and walk 50' pushing O2 tank about 30' before taken by writer to assist with energy conservation and fatigue as O2 sats were dropping on return to room. Sats 91% at rest 3 LPM.  Sats decrease to 83% on 3 lpm but increase with seated rest and increase to 4 lpm.  Pt did state after session MD told her she should walk on 4 lpm to maintain sats. Recommended use of RW at home for balance and energy conservation.  Voiced understanding.   Recommendations for follow up therapy are one component of a multi-disciplinary discharge planning process, led by the attending physician.  Recommendations may be updated based on patient status, additional functional criteria and insurance authorization.  Follow Up Recommendations  Home health PT     Assistance Recommended at Discharge Set up Supervision/Assistance  Patient can return home with the following A little help with walking and/or transfers;Help with stairs or ramp for entrance   Equipment Recommendations  None recommended by PT    Recommendations for Other Services       Precautions / Restrictions Precautions Precautions: Fall Restrictions Weight Bearing Restrictions: No     Mobility  Bed Mobility Overal bed mobility: Modified Independent                  Transfers Overall transfer level: Needs assistance Equipment used: None Transfers: Sit to/from Stand Sit to Stand: Supervision                Ambulation/Gait Ambulation/Gait assistance: Min  guard, Min assist Gait Distance (Feet): 50 Feet Assistive device: None Gait Pattern/deviations: Step-through pattern, Decreased step length - right, Decreased step length - left Gait velocity: decreased     General Gait Details: pushes O2 tank initially per her choice but as sats decreased O2 tank is taken so she can focus on breathing and energy conservation.   Stairs             Wheelchair Mobility    Modified Rankin (Stroke Patients Only)       Balance Overall balance assessment: Needs assistance Sitting-balance support: Feet supported Sitting balance-Leahy Scale: Good     Standing balance support: Bilateral upper extremity supported Standing balance-Leahy Scale: Fair Standing balance comment: unsteady as she fatiued.  encouraged RW for balance and energy conservation at home                            Cognition Arousal/Alertness: Awake/alert Behavior During Therapy: [facility] for tasks assessed/performed Overall Cognitive Status: Within Functional Limits for tasks assessed                                          Exercises      General Comments        Pertinent Vitals/Pain Pain Assessment Pain Assessment: No/denies pain    Home Living  Prior Function            PT Goals (current goals can now be found in the care plan section) Progress towards PT goals: Progressing toward goals    Frequency    Min 2X/week      PT Plan Current plan remains appropriate    Co-evaluation              AM-PAC PT "6 Clicks" Mobility   Outcome Measure  Help needed turning from your back to your side while in a flat bed without using bedrails?: None Help needed moving from lying on your back to sitting on the side of a flat bed without using bedrails?: None Help needed moving to and from a bed to a chair (including a wheelchair)?: None Help needed standing up from a chair using your arms (e.g.,  wheelchair or bedside chair)?: None Help needed to walk in hospital room?: A Little Help needed climbing 3-5 steps with a railing? : A Little 6 Click Score: 22    End of Session Equipment Utilized During Treatment: Gait belt;Oxygen Activity Tolerance: Patient limited by fatigue Patient left: in chair;with call bell/phone within reach;with nursing/sitter in room Nurse Communication: Mobility status PT Visit Diagnosis: Muscle weakness (generalized) (M62.81);Difficulty in walking, not elsewhere classified (R26.2)     Time: 4496-7591 PT Time Calculation (min) (ACUTE ONLY): 13 min  Charges:  $Gait Training: 8-22 mins                   Chesley Noon, PTA 08/05/22, 12:50 PM

## 2022-08-05 NOTE — TOC Transition Note (Addendum)
Transition of Care [facility]) - CM/SW Discharge Note   Patient Details  Name: Suzanne Glenn MRN: [id number] Date of Birth: 1950/11/24  [facility] of [facility]) CM/SW Contact:  Izola Price, RN Phone Number: 08/05/2022, 2:56 PM   Clinical Narrative:  10/22: Patient will be discharging today to home with spouse transporting. DME oxygen ordered and increasing from 2L to 4L/[place]. [employer] notified and will deliver new condenser to home tomorrow to replace older condenser. Patient will let [employer] know if needs new nebulizer ordered. Transport oxygen via existing portable condenser spouse will bring to hospital. Patient declines HH recommendations or will go through PCP to obtain prior services. 3:1 to be delivered to home address and has a RW at home. Simmie Davies RN CM       Barriers to Discharge: Barriers Resolved   Patient Goals and CMS Choice     Choice offered to / list presented to : Patient  Discharge Placement                       Discharge Plan and Services     Post Acute Care Choice: Durable Medical Equipment          DME Arranged: 3-N-1, Oxygen DME Agency: [employer] Date DME Agency Contacted: 08/05/22 Time DME Agency Contacted: 0539 Representative spoke with at DME Agency: [place]: NA [place] Agency: NA        Social Determinants of Health ([place]) Interventions     Readmission Risk Interventions    08/03/2022    2:49 PM 08/22/2021   12:19 PM 08/20/2021    2:50 PM  Readmission Risk Prevention Plan  Transportation Screening Complete Complete Complete  PCP or Specialist Appt within 3-5 Days Complete  Complete  HRI or [place] Patient refused  Complete  Social Work Consult for [place] Planning/Counseling Complete  Complete  Palliative Care Screening Not Applicable  Not Applicable  Medication Review Press photographer) Complete  Complete  SW Recovery Care/Counseling Consult  Complete   [place]  Not Applicable

## 2022-08-05 NOTE — Progress Notes (Signed)
Patient ambulated around unit on 4L  o2 via nasal canula  saturation remained at 93%.

## 2022-08-05 NOTE — Plan of Care (Signed)
  Problem: Education: Goal: Ability to describe self-care measures that may prevent or decrease complications (Diabetes Survival Skills Education) will improve Outcome: Progressing Goal: Individualized Educational Video(s) Outcome: Progressing   Problem: Coping: Goal: Ability to adjust to condition or change in health will improve Outcome: Progressing   Problem: Fluid Volume: Goal: Ability to maintain a balanced intake and output will improve Outcome: Progressing   Problem: Health Behavior/Discharge Planning: Goal: Ability to identify and utilize available resources and services will improve Outcome: Progressing Goal: Ability to manage health-related needs will improve Outcome: Progressing   Problem: Metabolic: Goal: Ability to maintain appropriate glucose levels will improve Outcome: Progressing   Problem: Nutritional: Goal: Maintenance of adequate nutrition will improve Outcome: Progressing Goal: Progress toward achieving an optimal weight will improve Outcome: Progressing   Problem: Skin Integrity: Goal: Risk for impaired skin integrity will decrease Outcome: Progressing   Problem: Education: Goal: Knowledge of General Education information will improve Description: Including pain rating scale, medication(s)/side effects and non-pharmacologic comfort measures Outcome: Progressing   Problem: Tissue Perfusion: Goal: Adequacy of tissue perfusion will improve Outcome: Progressing   Problem: Health Behavior/Discharge Planning: Goal: Ability to manage health-related needs will improve Outcome: Progressing   Problem: Activity: Goal: Risk for activity intolerance will decrease Outcome: Progressing   Problem: Nutrition: Goal: Adequate nutrition will be maintained Outcome: Progressing   Problem: Coping: Goal: Level of anxiety will decrease Outcome: Progressing   Problem: Elimination: Goal: Will not experience complications related to bowel motility Outcome:  Progressing Goal: Will not experience complications related to urinary retention Outcome: Progressing   Problem: Pain Managment: Goal: General experience of comfort will improve Outcome: Progressing   Problem: Safety: Goal: Ability to remain free from injury will improve Outcome: Progressing   Problem: Skin Integrity: Goal: Risk for impaired skin integrity will decrease Outcome: Progressing

## 2022-08-05 NOTE — Discharge Summary (Signed)
Physician Discharge Summary   Patient: Suzanne Glenn MRN: [id number] DOB: 1950-11-17  Admit date:     07/30/2022  Discharge date: 08/05/22  Discharge Physician: Vashti Hey   PCP: Tracie Harrier, MD    Discharge Diagnoses: Principal Problem:   Acute respiratory failure with hypoxia and hypercapnia ([place]) Active Problems:   Lobar pneumonia ([place])   COPD with acute exacerbation ([place])   Essential hypertension   Type 2 diabetes mellitus with hyperlipidemia ([place])   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HCC)   Obesity (BMI 30-39.9)  Resolved Problems:   * No resolved hospital problems. *  Hospital Course:  71 year old with history of COPD on 3 L nasal cannula, right upper lobe lung cancer, CAD, DM 2, GERD, HLD, HTN brought to the hospital for respiratory distress.  Patient was diagnosed with pneumonia and started on antibiotics.  Initially due to hypercarbia and hypoxia also required BiPAP.  Critical care team was consulted.  Patient slowly did well with bronchodilators and diuresis.  She was transferred out of the ICU.  Initially wanted to be DNR then change her CODE STATUS back to full.  Patient felt much improved with treatment for COPD and LLL pneumonia.  Patient was able to be titrated down to nasal cannula, requiring  4 L oxygen maintain O2 saturations greater than 92% with exertion.  With ambulation, patient desaturated to 87% when she was tapered down to 3 L.    Assessment and Plan:    * Acute respiratory failure with hypoxia and hypercapnia (HCC) On3 L nasal cannula patient gets very short of breath with minimal exertion.  Patient requires 4 L of oxygen to maintain O2 saturations greater than 92% upon discharge.     Lobar pneumonia ([place]) Left lobar pneumonia.   Viral respiratory panel , COVID, influenza a and B  are all negative  Completed 7-day course of doxycycline and Rocephin   COPD with acute exacerbation ([place]) Likely exacerbated by underlying  pneumonia.   She is discharged on prednisone 50 mg daily, 10-day supply given however it was stressed to her that she needed to see a PCP or pulmonologist within the week to get more prednisone since it would have been dangerous for her to stop suddenly.     Acute congestive heart failure with reduced ejection fraction Echo in March 2023 showed EF of 45-50%, grade 1 DD, normal RV function.  Intermittently getting Lasix.   She is euvolemic upon discharge, patient to follow-up with her cardiologist for a more stable Lasix regimen  Essential hypertension Remained hypertensive throughout, she apparently was not taking her amlodipine Amlodipine 5 mg daily restarted Continue metoprolol  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[place]) Follow-up as outpatient.   Type 2 diabetes mellitus with hyperlipidemia (HCC) Recent hemoglobin A1c 6.1.   Home diabetes regimen was restarted upon discharge   History of Parkinson's On carbidopa/levodopa  Anxiety and depression Patient was discharged on Celexa to 25 mg per home doses  Consultants: PCCM/critical care Procedures performed: None Disposition: Home Diet recommendation:  Carb modified diet DISCHARGE MEDICATION: Allergies as of 08/05/2022       Reactions   Diclofenac-misoprostol Other (See Comments)   Arthrotec - gastritis    Nsaids Other (See Comments)   gastritis   Zolpidem Other (See Comments)   Sleep walking    Hydrocodone-acetaminophen Nausea And Vomiting   Simvastatin Other (See Comments)   body aches        Medication List     TAKE these  medications    Accu-Chek FastClix Lancets Misc   Accu-Chek SmartView test strip Generic drug: glucose blood   albuterol 108 (90 Base) MCG/ACT inhaler Commonly known as: VENTOLIN HFA Inhale 2 puffs into the lungs every 4 (four) hours as needed for wheezing or shortness of breath.   amLODipine 10 MG tablet Commonly known as: NORVASC Take 10 mg by mouth daily.   aspirin 81 MG chewable  tablet Chew by mouth daily.   carbidopa-levodopa 25-100 MG tablet Commonly known as: SINEMET IR Take 1 tablet by mouth 3 (three) times daily.   cyanocobalamin 1000 MCG tablet Take 1 tablet by mouth daily.   esomeprazole 40 MG capsule Commonly known as: NEXIUM Take 40 mg by mouth daily.   feeding supplement Liqd Take 237 mLs by mouth 2 (two) times daily between meals.   ferrous sulfate 325 (65 FE) MG tablet Take 1 tablet (325 mg total) by mouth 2 (two) times daily with a meal.   Fluticasone-Umeclidin-Vilant 100-62.5-25 MCG/INH Aepb Inhale 1 puff into the lungs daily. Treligy   gabapentin 300 MG capsule Commonly known as: NEURONTIN Take 1 capsule (300 mg total) by mouth 3 (three) times daily.   ipratropium-albuterol 0.5-2.5 (3) MG/3ML Soln Commonly known as: DUONEB Take 3 mLs by nebulization every 6 (six) hours.   isosorbide mononitrate 30 MG 24 hr tablet Commonly known as: IMDUR Take 1 tablet by mouth daily.   metFORMIN 500 MG tablet Commonly known as: GLUCOPHAGE Take 1 tablet (500 mg total) by mouth 2 (two) times daily with a meal.   metoprolol tartrate 25 MG tablet Commonly known as: LOPRESSOR Take 25 mg by mouth 2 (two) times daily.   mirtazapine 7.5 MG tablet Commonly known as: REMERON Take 1 tablet (7.5 mg total) by mouth at bedtime.   OXYGEN Inhale 2 L into the lungs continuous.   pravastatin 80 MG tablet Commonly known as: PRAVACHOL Take 80 mg by mouth every evening.   predniSONE 10 MG tablet Commonly known as: DELTASONE Take 5 tablets (50 mg total) by mouth daily for 10 days.   spironolactone 25 MG tablet Commonly known as: ALDACTONE Take 25 mg by mouth daily.   venlafaxine XR 150 MG 24 hr capsule Commonly known as: EFFEXOR-XR Take 1 capsule (150 mg total) by mouth daily. Take total of 225 mg daily, take along with 75 mg cap   venlafaxine XR 75 MG 24 hr capsule Commonly known as: EFFEXOR-XR Take 1 capsule (75 mg total) by mouth daily. Take  total of 225 mg daily, take along with 150 mg cap               Durable Medical Equipment  (From admission, onward)           Start     Ordered   08/05/22 1501  For home use only DME oxygen  Once       Question Answer Comment  Length of Need Lifetime   Mode or (Route) Nasal cannula   Liters per Minute 4   Frequency Continuous (stationary and portable oxygen unit needed)   Oxygen delivery system Gas      08/05/22 1500   08/03/22 1503  For home use only DME Nebulizer machine  Once       Question Answer Comment  Patient needs a nebulizer to treat with the following condition COPD (chronic obstructive pulmonary disease) ([place])   Length of Need Lifetime      08/03/22 1502   08/01/22 1318  For home use  only DME 3 n 1  Once        08/01/22 1318            Discharge Exam: Filed Weights   07/30/22 0729 07/30/22 1045  Weight: 87.9 kg 84.9 kg     Condition at discharge: fair  The results of significant diagnostics from this hospitalization (including imaging, microbiology, ancillary and laboratory) are listed below for reference.   Imaging Studies: DG Chest Port 1 View  Result Date: 07/30/2022 CLINICAL DATA:  Sepsis EXAM: PORTABLE CHEST 1 VIEW COMPARISON:  07/04/2022 FINDINGS: Right lung remains clear. New/worsening pulmonary infiltrate in the left midlung consistent with bronchopneumonia. Small amount of pleural fluid on the left. IMPRESSION: New/worsening pulmonary infiltrate in the left midlung consistent with bronchopneumonia. Electronically Signed   By: Nelson Chimes M.D.   On: 07/30/2022 07:55    Microbiology: Results for orders placed or performed during the hospital encounter of 07/30/22  Culture, blood (Routine x 2)     Status: None   Collection Time: 07/30/22  7:39 AM   Specimen: BLOOD  Result Value Ref Range Status   Specimen Description BLOOD LEFT WRIST  Final   Special Requests   Final    BOTTLES DRAWN AEROBIC AND ANAEROBIC Blood Culture adequate  volume   Culture   Final    NO GROWTH 5 DAYS Performed at [facility], [address], [place], [place] [address]    Report Status 08/04/2022 FINAL  Final  Culture, blood (Routine x 2)     Status: None   Collection Time: 07/30/22  7:40 AM   Specimen: BLOOD  Result Value Ref Range Status   Specimen Description BLOOD LEFT [facility]  Final   Special Requests   Final    BOTTLES DRAWN AEROBIC AND ANAEROBIC Blood Culture adequate volume   Culture   Final    NO GROWTH 5 DAYS Performed at [facility], [address] [place], [place] [address]    Report Status 08/04/2022 FINAL  Final  Resp Panel by RT-PCR (Flu A&B, Covid) Anterior Nasal Swab     Status: None   Collection Time: 07/30/22 10:00 AM   Specimen: Anterior Nasal Swab  Result Value Ref Range Status   SARS Coronavirus 2 by RT PCR NEGATIVE NEGATIVE Final    Comment: (NOTE) SARS-CoV-2 target nucleic acids are NOT DETECTED.  The SARS-CoV-2 RNA is generally detectable in upper respiratory specimens during the acute phase of infection. The lowest concentration of SARS-CoV-2 viral copies this assay can detect is 138 copies/mL. A negative result does not preclude SARS-Cov-2 infection and should not be used as the sole basis for treatment or other patient management decisions. A negative result may occur with  improper specimen collection/handling, submission of specimen other than nasopharyngeal swab, presence of viral mutation(s) within the areas targeted by this assay, and inadequate number of viral copies(<138 copies/mL). A negative result must be combined with clinical observations, patient history, and epidemiological information. The expected result is Negative.  Fact Sheet for Patients:  EntrepreneurPulse.com.au  Fact Sheet for Healthcare Providers:  IncredibleEmployment.be  This test is no t yet approved or cleared by the [place] FDA and  has been authorized for  detection and/or diagnosis of SARS-CoV-2 by FDA under an Emergency Use Authorization (EUA). This EUA will remain  in effect (meaning this test can be used) for the duration of the COVID-19 declaration under Section 564(b)(1) of the Act, 21 U.S.C.section 360bbb-3(b)(1), unless the authorization is terminated  or revoked  sooner.       Influenza A by PCR NEGATIVE NEGATIVE Final   Influenza B by PCR NEGATIVE NEGATIVE Final    Comment: (NOTE) The Xpert Xpress SARS-CoV-2/FLU/RSV plus assay is intended as an aid in the diagnosis of influenza from Nasopharyngeal swab specimens and should not be used as a sole basis for treatment. Nasal washings and aspirates are unacceptable for Xpert Xpress SARS-CoV-2/FLU/RSV testing.  Fact Sheet for Patients: EntrepreneurPulse.com.au  Fact Sheet for Healthcare Providers: IncredibleEmployment.be  This test is not yet approved or cleared by the [place] FDA and has been authorized for detection and/or diagnosis of SARS-CoV-2 by FDA under an Emergency Use Authorization (EUA). This EUA will remain in effect (meaning this test can be used) for the duration of the COVID-19 declaration under Section 564(b)(1) of the Act, 21 U.S.C. section 360bbb-3(b)(1), unless the authorization is terminated or revoked.  Performed at [facility], [place], [place] [address]   Respiratory (~20 pathogens) panel by PCR     Status: None   Collection Time: 07/30/22 10:58 AM   Specimen: Nasopharyngeal Swab; Respiratory  Result Value Ref Range Status   Adenovirus NOT DETECTED NOT DETECTED Final   Coronavirus 229E NOT DETECTED NOT DETECTED Final    Comment: (NOTE) The Coronavirus on the Respiratory Panel, DOES NOT test for the novel  Coronavirus (2019 nCoV)    Coronavirus HKU1 NOT DETECTED NOT DETECTED Final   Coronavirus NL63 NOT DETECTED NOT DETECTED Final   Coronavirus OC43 NOT DETECTED NOT DETECTED Final    Metapneumovirus NOT DETECTED NOT DETECTED Final   Rhinovirus / Enterovirus NOT DETECTED NOT DETECTED Final   Influenza A NOT DETECTED NOT DETECTED Final   Influenza B NOT DETECTED NOT DETECTED Final   Parainfluenza Virus 1 NOT DETECTED NOT DETECTED Final   Parainfluenza Virus 2 NOT DETECTED NOT DETECTED Final   Parainfluenza Virus 3 NOT DETECTED NOT DETECTED Final   Parainfluenza Virus 4 NOT DETECTED NOT DETECTED Final   Respiratory Syncytial Virus NOT DETECTED NOT DETECTED Final   Bordetella pertussis NOT DETECTED NOT DETECTED Final   Bordetella Parapertussis NOT DETECTED NOT DETECTED Final   Chlamydophila pneumoniae NOT DETECTED NOT DETECTED Final   Mycoplasma pneumoniae NOT DETECTED NOT DETECTED Final    Comment: Performed at [facility], [place]. [address] [place], [place] [address]  MRSA Next Gen by PCR, Nasal     Status: None   Collection Time: 07/30/22 10:58 AM   Specimen: Nasal Mucosa; Nasal Swab  Result Value Ref Range Status   MRSA by PCR Next Gen NOT DETECTED NOT DETECTED Final    Comment: (NOTE) The GeneXpert MRSA Assay (FDA approved for NASAL specimens only), is one component of a comprehensive MRSA colonization surveillance program. It is not intended to diagnose MRSA infection nor to guide or monitor treatment for MRSA infections. Test performance is not FDA approved in patients less than 27 years old. Performed at [facility], [address] [place], [place] [address]   Expectorated Sputum Assessment w Gram Stain, Rflx to Resp Cult     Status: None   Collection Time: 07/30/22 12:30 PM   Specimen: Sputum  Result Value Ref Range Status   Specimen Description SPUTUM  Final   Special Requests NONE  Final   Sputum evaluation   Final    THIS SPECIMEN IS ACCEPTABLE FOR SPUTUM CULTURE Performed at [facility], [address] [place], [place] [address]    Report Status 07/30/2022 FINAL  Final  Culture, Respiratory w Gram Stain     Status:  None   Collection Time: 07/30/22 12:30 PM   Specimen: SPU  Result Value Ref Range Status   Specimen Description   Final    SPUTUM Performed at [facility], [address], [place], [place] [address]    Special Requests   Final    NONE Reflexed from [id number] Performed at [facility], [address] [place], [place] [address]    Gram Stain   Final    FEW WBC PRESENT, PREDOMINANTLY MONONUCLEAR MODERATE GRAM POSITIVE COCCI IN PAIRS MODERATE GRAM POSITIVE COCCI IN CLUSTERS MODERATE GRAM NEGATIVE RODS    Culture   Final    MODERATE Normal respiratory flora-no Staph aureus or Pseudomonas seen Performed at [facility], [place] [address] [place], [place] [address]    Report Status 08/03/2022 FINAL  Final    Labs: CBC: Recent Labs  Lab 07/30/22 0739 07/31/22 0412 08/01/22 0320 08/01/22 0930 08/02/22 0401 08/04/22 0452  WBC 10.7* 9.9 12.0* 13.0* 9.8 15.8*  NEUTROABS 8.3*  --  9.8*  --   --   --   HGB 11.2* 9.6* 10.1* 10.4* 10.6* 10.9*  HCT 39.7 32.7* 34.2* 34.8* 35.7* 34.9*  MCV 102.3* 100.6* 98.8 98.6 98.3 94.3  PLT 278 229 286 315 336 309*   Basic Metabolic Panel: Recent Labs  Lab 07/30/22 0943 07/31/22 0412 08/01/22 0320 08/02/22 0401 08/03/22 0440 08/04/22 0452 08/05/22 0449  NA  --    < > 143 141 137 138 141  K  --    < > 3.8 4.9 3.7 4.0 5.0  CL  --    < > 93* 88* 84* 86* 91*  CO2  --    < > 42* 45* 41* 42* 41*  GLUCOSE  --    < > 127* 182* 189* 214* 221*  BUN  --    < > 30* 33* 31* 34* 32*  CREATININE  --    < > 0.69 0.72 0.73 0.85 0.83  CALCIUM  --    < > 9.1 9.1 8.9 8.9 9.3  MG 2.4  --   --  2.0 1.7 1.6* 1.9   < > = values in this interval not displayed.   Liver Function Tests: Recent Labs  Lab 07/30/22 0739 08/01/22 0320  AST 15 13*  ALT 13 <5  ALKPHOS 86 67  BILITOT 0.5 0.3  PROT 7.7 6.3*  ALBUMIN 3.5 2.8*   CBG: Recent Labs  Lab 08/04/22 1213 08/04/22 1654 08/04/22 2107 08/05/22 0804 08/05/22 1159  GLUCAP  240* 273* 165* 144* 146*    Discharge time spent: greater than 30 minutes.  Signed: Vashti Hey, MD [facility] 08/05/2022

## 2022-08-08 DIAGNOSIS — I2510 Atherosclerotic heart disease of native coronary artery without angina pectoris: Secondary | ICD-10-CM | POA: Diagnosis not present

## 2022-08-08 DIAGNOSIS — J189 Pneumonia, unspecified organism: Secondary | ICD-10-CM | POA: Diagnosis not present

## 2022-08-08 DIAGNOSIS — Z85118 Personal history of other malignant neoplasm of bronchus and lung: Secondary | ICD-10-CM | POA: Diagnosis not present

## 2022-08-08 DIAGNOSIS — F324 Major depressive disorder, single episode, in partial remission: Secondary | ICD-10-CM | POA: Diagnosis not present

## 2022-08-08 DIAGNOSIS — J9620 Acute and chronic respiratory failure, unspecified whether with hypoxia or hypercapnia: Secondary | ICD-10-CM | POA: Diagnosis not present

## 2022-08-08 DIAGNOSIS — Z09 Encounter for follow-up examination after completed treatment for conditions other than malignant neoplasm: Secondary | ICD-10-CM | POA: Diagnosis not present

## 2022-08-08 DIAGNOSIS — F17210 Nicotine dependence, cigarettes, uncomplicated: Secondary | ICD-10-CM | POA: Diagnosis not present

## 2022-08-08 DIAGNOSIS — E119 Type 2 diabetes mellitus without complications: Secondary | ICD-10-CM | POA: Diagnosis not present

## 2022-08-29 DIAGNOSIS — J189 Pneumonia, unspecified organism: Secondary | ICD-10-CM | POA: Diagnosis not present

## 2022-08-31 ENCOUNTER — Other Ambulatory Visit: Payer: Self-pay | Admitting: *Deleted

## 2022-08-31 ENCOUNTER — Telehealth: Payer: Self-pay | Admitting: Oncology

## 2022-08-31 NOTE — Telephone Encounter (Signed)
Returned phone call to patient. She has been in the hospital with pneumonia. Dr Raul Del wants to order a CT scan for follow up. She told him she needed to contact us first to see what should happen. I told her to let Dr Raul Del order the CT for her now to re-evaulate pneumonia; We in fact may be able to utilize that scan as well as Dr Grayland Ormond has one ordered for Feb 2024.

## 2022-08-31 NOTE — Telephone Encounter (Signed)
pt called in to req a call back in regards to CT Scan ordered.

## 2022-09-01 NOTE — Progress Notes (Unsigned)
BH MD/PA/NP OP Progress Note  09/04/2022 11:33 AM Suzanne Glenn  MRN:  [id number]  Chief Complaint:  Chief Complaint  Patient presents with   Follow-up   HPI:  - She was admitted due to acute respiratory failure with hypoxia and hypercapnia in the setting of pneumonia.   This is a follow-up appointment for depression and anxiety.  She states that she woke up in the hospital, and was scared when she was asked about the recent admission.  She also states that her doctor was "acting weird."  She saw toothpaste on his head. She reflected back that she might be hallucinating ("it's me.")  She was also feeling that people were against her.  Although one of the staffs were helpful initially, she did not come to her on later days. She denies feeling confused since discharge from the hospital.  Her grandson visit her, and she enjoys the time together.  She sleeps well on most days.  There is fluctuation in her appetite.  She denies any significant anxiety, and denies panic attacks.  Although she tried gabapentin 300 mg, she increased up to 400 mg again as she had refill left from previous orders.  She has not noticed any difference, and feels comfortable to lower the dose again. The patient has mood symptoms as in PHQ-9/GAD-7. She denies SI.  She has restless leg. She continues to smoke cigarettes.  Although she has patches at home, she does not use it as it causes insomnia.    Mini cog- oriented x4, delayed recall 3/3, clock drawing 3/3  Functional Status Instrumental Activities of Daily Living (IADLs):  [name] is independent in the following: medications, cooking Requires assistance with the following: managing finances, driving (her husband does not allow due to her history of syncope)  Activities of Daily Living (ADLs):  [name] is independent in the following: bathing and hygiene, feeding, continence, grooming and toileting, walking   Daily routine: household chores,  sees her son every day with 52 yo grandchildren  Exercise: Employment: used to work at [employer], [place] in [place]: husband  Household: husband in mobile home Marital status: married for 70 years Number of children: 2. (Her daughter passed away in 01-05-02 from diabetes at 70 year old) She grew up in [place]. She used to have a very close family. There is some discordance with one of her sisters   Wt Readings from Last 3 Encounters:  09/04/22 185 lb 12.8 oz (84.3 kg)  07/30/22 187 lb 2.7 oz (84.9 kg)  07/12/22 191 lb (86.6 kg)     Visit Diagnosis:    ICD-10-CM   1. MDD (major depressive disorder), recurrent, in partial remission ([place])  F33.41     2. GAD (generalized anxiety disorder)  F41.1     3. Insomnia, unspecified type  G47.00     4. Restless leg syndrome  G25.81       Past Psychiatric History: Please see initial evaluation for full details. I have reviewed the history. No updates at this time.     Past Medical History:  Past Medical History:  Diagnosis Date   Anxiety    Asthma    Cancer ([place]) 06-Jan-2019   w/u for right upper lobe mass/cancer   COPD (chronic obstructive pulmonary disease) (HCC)    also, emphysema. now using o2 via np 24 hours a day   Coronary artery disease    Depression    Diabetes mellitus, type II ([place])    GERD (  gastroesophageal reflux disease)    Headache    migraines in early 20's   Heart murmur    HLD (hyperlipidemia)    Hypertension    Lung cancer ([place])    Neuropathy    Restless leg     Past Surgical History:  Procedure Laterality Date   BACK SURGERY  2005   surgery x 2, disc fused in neck, pinched nerve in center of back and neck   CARDIAC CATHETERIZATION  2015   1 stent placed for blockage   cervical bone infusion     CHOLECYSTECTOMY     DILATION AND CURETTAGE OF UTERUS     FOOT SURGERY Left    foot bone spur removed   [place] WITH ENDOBRONCHIAL ULTRASOUND N/A 03/06/2019    Procedure: VIDEO BRONCHOSCOPY WITH ENDOBRONCHIAL ULTRASOUND - DIABETIC;  Surgeon: Ottie Glazier, MD;  Location: [facility] ORS;  Service: Thoracic;  Laterality: N/A;    Family Psychiatric History: Please see initial evaluation for full details. I have reviewed the history. No updates at this time.     Family History:  Family History  Problem Relation Age of Onset   Anxiety disorder Mother    Cancer - Lung Mother    Depression Sister    Cancer Sister    Cancer Sister    Cancer Sister    Cancer Sister    Heart Problems Sister    Bipolar disorder Sister    Diabetes Sister    Cancer - Lung Sister    Hypertension Sister    Diabetes Sister    Hyperlipidemia Sister    COPD Sister    Depression Brother    Heart Problems Brother    Cancer Brother    Cancer Brother    Cancer Brother    Breast cancer Maternal Aunt    Depression Son     Social History:  Social History   Socioeconomic History   Marital status: Married    Spouse name: Abe People   Number of children: Not on file   Years of education: Not on file   Highest education level: Not on file  Occupational History    Comment: disabled  Tobacco Use   Smoking status: Every Day    Packs/day: 1.00    Years: 40.00    Total pack years: 40.00    Types: Cigarettes    Start date: 05/05/1975   Smokeless tobacco: Never  Vaping Use   Vaping Use: Never used  Substance and Sexual Activity   Alcohol use: No    Alcohol/week: 0.0 standard drinks of alcohol    Comment: socially   Drug use: No   Sexual activity: Not Currently  Other Topics Concern   Not on file  Social History Narrative   Not on file   Social Determinants of Health   Financial Resource Strain: Not on file  Food Insecurity: No Food Insecurity (07/05/2022)   Hunger Vital Sign    Worried About Running Out of Food in the Last Year: Never true    Ran Out of Food in the Last Year: Never true  Transportation Needs: No Transportation Needs (07/05/2022)   PRAPARE -  Hydrologist (Medical): No    Lack of Transportation (Non-Medical): No  Physical Activity: Not on file  Stress: Not on file  Social Connections: Not on file    Allergies:  Allergies  Allergen Reactions   Diclofenac-Misoprostol Other (  See Comments)    Arthrotec - gastritis    Nsaids Other (See Comments)    gastritis   Zolpidem Other (See Comments)    Sleep walking    Hydrocodone-Acetaminophen Nausea And Vomiting   Simvastatin Other (See Comments)    body aches    Metabolic Disorder Labs: Lab Results  Component Value Date   HGBA1C 6.1 (H) 07/05/2022   MPG 128.37 07/05/2022   MPG 148.46 08/20/2021   No results found for: "PROLACTIN" No results found for: "CHOL", "TRIG", "HDL", "CHOLHDL", "VLDL", "LDLCALC" No results found for: "TSH"  Therapeutic Level Labs: No results found for: "LITHIUM" No results found for: "VALPROATE" No results found for: "CBMZ"  Current Medications: Current Outpatient Medications  Medication Sig Dispense Refill   ACCU-CHEK FASTCLIX LANCETS MISC      ACCU-CHEK SMARTVIEW test strip      albuterol (VENTOLIN HFA) 108 (90 Base) MCG/ACT inhaler Inhale 2 puffs into the lungs every 4 (four) hours as needed for wheezing or shortness of breath.      amLODipine (NORVASC) 10 MG tablet Take 10 mg by mouth daily.      aspirin 81 MG chewable tablet Chew by mouth daily.     carbidopa-levodopa (SINEMET IR) 25-100 MG tablet Take 1 tablet by mouth 3 (three) times daily.     cyanocobalamin 1000 MCG tablet Take 1 tablet by mouth daily.     esomeprazole (NEXIUM) 40 MG capsule Take 40 mg by mouth daily.     feeding supplement (ENSURE ENLIVE / ENSURE PLUS) LIQD Take 237 mLs by mouth 2 (two) times daily between meals. 237 mL 12   ferrous sulfate 325 (65 FE) MG tablet Take 1 tablet (325 mg total) by mouth 2 (two) times daily with a meal. 30 tablet 3   Fluticasone-Umeclidin-Vilant 100-62.5-25 MCG/INH AEPB Inhale 1 puff into the lungs daily.  Treligy     gabapentin (NEURONTIN) 300 MG capsule Take 1 capsule (300 mg total) by mouth 3 (three) times daily. 90 capsule 1   ipratropium-albuterol (DUONEB) 0.5-2.5 (3) MG/3ML SOLN Take 3 mLs by nebulization every 6 (six) hours. 360 mL 0   isosorbide mononitrate (IMDUR) 30 MG 24 hr tablet Take 1 tablet by mouth daily.     metFORMIN (GLUCOPHAGE) 500 MG tablet Take 1 tablet (500 mg total) by mouth 2 (two) times daily with a meal. 60 tablet 1   metoprolol tartrate (LOPRESSOR) 25 MG tablet Take 25 mg by mouth 2 (two) times daily.      mirtazapine (REMERON) 7.5 MG tablet Take 1 tablet (7.5 mg total) by mouth at bedtime. 90 tablet 0   OXYGEN Inhale 2 L into the lungs continuous.     pravastatin (PRAVACHOL) 80 MG tablet Take 80 mg by mouth every evening.      spironolactone (ALDACTONE) 25 MG tablet Take 25 mg by mouth daily.      venlafaxine XR (EFFEXOR-XR) 75 MG 24 hr capsule Take 1 capsule (75 mg total) by mouth daily. Take total of 225 mg daily, take along with 150 mg cap 90 capsule 1   venlafaxine XR (EFFEXOR-XR) 150 MG 24 hr capsule Take 1 capsule (150 mg total) by mouth daily. Take total of 225 mg daily, take along with 75 mg cap 90 capsule 1   No current facility-administered medications for this visit.     Musculoskeletal: Strength & Muscle Tone: within normal limits Gait & Station: normal Patient leans: N/A  Psychiatric Specialty Exam: Review of Systems  Psychiatric/Behavioral:  Positive for  dysphoric mood and sleep disturbance. Negative for agitation, behavioral problems, confusion, decreased concentration, hallucinations, self-injury and suicidal ideas. The patient is nervous/anxious. The patient is not hyperactive.   All other systems reviewed and are negative.   Blood pressure 109/60, pulse (!) 103, temperature 97.9 F (36.6 C), temperature source Oral, height 5\' 3"  (1.6 m), weight 185 lb 12.8 oz (84.3 kg), SpO2 (!) 67 %.Body mass index is 32.91 kg/m.  General Appearance: Fairly  Groomed  Eye Contact:  Good  Speech:  Clear and Coherent  Volume:  Normal  Mood:   fine  Affect:  Appropriate, Congruent, and Full Range  Thought Process:  Coherent  Orientation:  Full (Time, Place, and Person)  Thought Content: Logical   Suicidal Thoughts:  No  Homicidal Thoughts:  No  Memory:  Immediate;   Good  Judgement:  Good  Insight:  Good  Psychomotor Activity:  Normal  Concentration:  Concentration: Good and Attention Span: Good  Recall:  Good  Fund of Knowledge: Good  Language: Good  Akathisia:  No  Handed:  Right  AIMS (if indicated): not done  Assets:  Communication Skills Desire for Improvement  ADL's:  Intact  Cognition: WNL  Sleep:  Fair   Screenings: GAD-7    Flowsheet Row Office Visit from 09/04/2022 in [facility] Visit from 07/12/2022 in [facility]  Total GAD-7 Score 5 8      PHQ2-9    [place] Visit from 09/04/2022 in [facility] Office Visit from 07/12/2022 in [facility] Office Visit from 04/19/2022 in [facility] Visit from 03/15/2022 in [facility] Office Visit from 01/22/2022 in [facility]  PHQ-2 Total Score 1 1 0 2 4  PHQ-9 Total Score 5 7 0 12 15      Flowsheet Row ED to Hosp-Admission (Discharged) from 07/30/2022 in [facility] Office Visit from 07/12/2022 in [facility] ED to Hosp-Admission (Discharged) from 07/04/2022 in [facility] No Risk No Risk No Risk        Assessment and Plan:  RAYNESHA TIEDT is a 72 y.o. 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   1. MDD (major depressive disorder), recurrent, in partial remission ([place]) 2. GAD (generalized anxiety disorder) Although she had worsening in anxiety in the context of recent admission, it has been improving overall since discharge. Psychosocial stressors includes her medical condition of COPD on oxygen,  loss of her daughter from diabetes several years ago, and conflict with one of her sisters.  She enjoys taking care of her grandchildren.  Although she has been taking higher dose of gabapentin from old prescription, she has not noticed any difference.  She is willing to try again to lower the dose of gabapentin to avoid polypharmacy.  Will continue venlafaxine, mirtazapine to target depression and anxiety.   4. Restless leg syndrome Slightly worsened, which coincided with lowering the dose of gabapentin.  Will continue to assess this.   # r/o delirium- resolving She reports episode of hallucinations when she was in the hospital.  She is alert  and no abnormality on mini cog. Will continue to assess this.    # Tobacco use She is at contemplative stage for tobacco use.  She not interested in pharmacological treatment.  Will continue motivational interview.      Plan (she will contact the clinic when she needs a refill) Continue venlafaxine 225 mg daily Continue mirtazapine 7.5 mg at night  Decrease gabapentin 300 mg three times a day (facial numbness from 600 mg TID) Next appointment-  1/16 at 1 PM for 30 mins, in person - on metoprolol, sinemet for restless leg  [facility]   Past trials of medication: trazodone, doxepin, lunesta, Ambien (sleepwalking), temazepam   This clinician has discussed the side effect associated with medication prescribed during this encounter. Please refer to notes in the previous  encounters for more details.     The patient demonstrates the following risk factors for suicide: Chronic risk factors for suicide include: psychiatric disorder of depression . Acute risk factors for suicide include: family or marital conflict, unemployment, and loss (financial, interpersonal, professional). Protective factors for this patient include: positive social support. Considering these factors, the overall suicide risk at this point appears to be low. Patient is appropriate for outpatient follow up.            Collaboration of Care: Collaboration of Care: Other reviewed notes in Epic  Patient/Guardian was advised Release of Information must be obtained prior to any record release in order to collaborate their care with an outside provider. Patient/Guardian was advised if they have not already done so to contact the registration department to sign all necessary forms in order for [place] to release information regarding their care.   Consent: Patient/Guardian gives verbal consent for treatment and assignment of benefits for services provided during this visit. Patient/Guardian expressed understanding and agreed to proceed.    Norman Clay, MD 09/04/2022, 11:33 AM

## 2022-09-03 DIAGNOSIS — R918 Other nonspecific abnormal finding of lung field: Secondary | ICD-10-CM | POA: Diagnosis not present

## 2022-09-04 ENCOUNTER — Ambulatory Visit: Payer: Medicare HMO | Admitting: Psychiatry

## 2022-09-04 ENCOUNTER — Encounter: Payer: Self-pay | Admitting: Psychiatry

## 2022-09-04 VITALS — BP 109/60 | HR 103 | Temp 97.9°F | Ht 63.0 in | Wt 185.8 lb

## 2022-09-04 DIAGNOSIS — G4700 Insomnia, unspecified: Secondary | ICD-10-CM | POA: Diagnosis not present

## 2022-09-04 DIAGNOSIS — F3341 Major depressive disorder, recurrent, in partial remission: Secondary | ICD-10-CM | POA: Diagnosis not present

## 2022-09-04 DIAGNOSIS — G2581 Restless legs syndrome: Secondary | ICD-10-CM | POA: Diagnosis not present

## 2022-09-04 DIAGNOSIS — F411 Generalized anxiety disorder: Secondary | ICD-10-CM

## 2022-09-04 NOTE — Patient Instructions (Signed)
Continue venlafaxine 225 mg daily Continue mirtazapine 7.5 mg at night  Decrease gabapentin 300 mg three times a day Next appointment-  1/16 at 1 PM

## 2022-09-07 ENCOUNTER — Other Ambulatory Visit: Payer: Self-pay | Admitting: Psychiatry

## 2022-09-13 ENCOUNTER — Other Ambulatory Visit: Payer: Self-pay

## 2022-09-13 ENCOUNTER — Inpatient Hospital Stay
Admission: EM | Admit: 2022-09-13 | Discharge: 2022-09-20 | DRG: 190 | Disposition: A | Discharge status: Home Health | Payer: Medicare HMO | Attending: Student | Admitting: Student

## 2022-09-13 ENCOUNTER — Emergency Department: Payer: Medicare HMO

## 2022-09-13 DIAGNOSIS — Z8249 Family history of ischemic heart disease and other diseases of the circulatory system: Secondary | ICD-10-CM

## 2022-09-13 DIAGNOSIS — J90 Pleural effusion, not elsewhere classified: Secondary | ICD-10-CM | POA: Diagnosis present

## 2022-09-13 DIAGNOSIS — Z9981 Dependence on supplemental oxygen: Secondary | ICD-10-CM

## 2022-09-13 DIAGNOSIS — E1165 Type 2 diabetes mellitus with hyperglycemia: Secondary | ICD-10-CM | POA: Diagnosis present

## 2022-09-13 DIAGNOSIS — I2510 Atherosclerotic heart disease of native coronary artery without angina pectoris: Secondary | ICD-10-CM | POA: Diagnosis present

## 2022-09-13 DIAGNOSIS — J181 Lobar pneumonia, unspecified organism: Secondary | ICD-10-CM | POA: Diagnosis not present

## 2022-09-13 DIAGNOSIS — J432 Centrilobular emphysema: Principal | ICD-10-CM | POA: Diagnosis present

## 2022-09-13 DIAGNOSIS — J449 Chronic obstructive pulmonary disease, unspecified: Secondary | ICD-10-CM | POA: Diagnosis not present

## 2022-09-13 DIAGNOSIS — K219 Gastro-esophageal reflux disease without esophagitis: Secondary | ICD-10-CM | POA: Diagnosis present

## 2022-09-13 DIAGNOSIS — Z981 Arthrodesis status: Secondary | ICD-10-CM | POA: Diagnosis not present

## 2022-09-13 DIAGNOSIS — D509 Iron deficiency anemia, unspecified: Secondary | ICD-10-CM | POA: Diagnosis present

## 2022-09-13 DIAGNOSIS — Z1152 Encounter for screening for COVID-19: Secondary | ICD-10-CM

## 2022-09-13 DIAGNOSIS — J9621 Acute and chronic respiratory failure with hypoxia: Secondary | ICD-10-CM | POA: Diagnosis present

## 2022-09-13 DIAGNOSIS — G2581 Restless legs syndrome: Secondary | ICD-10-CM | POA: Diagnosis not present

## 2022-09-13 DIAGNOSIS — E559 Vitamin D deficiency, unspecified: Secondary | ICD-10-CM | POA: Diagnosis present

## 2022-09-13 DIAGNOSIS — F17210 Nicotine dependence, cigarettes, uncomplicated: Secondary | ICD-10-CM | POA: Diagnosis present

## 2022-09-13 DIAGNOSIS — Z85118 Personal history of other malignant neoplasm of bronchus and lung: Secondary | ICD-10-CM | POA: Diagnosis not present

## 2022-09-13 DIAGNOSIS — J9601 Acute respiratory failure with hypoxia: Secondary | ICD-10-CM | POA: Diagnosis not present

## 2022-09-13 DIAGNOSIS — E1141 Type 2 diabetes mellitus with diabetic mononeuropathy: Secondary | ICD-10-CM | POA: Diagnosis present

## 2022-09-13 DIAGNOSIS — I10 Essential (primary) hypertension: Secondary | ICD-10-CM | POA: Diagnosis present

## 2022-09-13 DIAGNOSIS — I493 Ventricular premature depolarization: Secondary | ICD-10-CM | POA: Diagnosis present

## 2022-09-13 DIAGNOSIS — J9811 Atelectasis: Secondary | ICD-10-CM | POA: Diagnosis present

## 2022-09-13 DIAGNOSIS — J189 Pneumonia, unspecified organism: Secondary | ICD-10-CM | POA: Diagnosis present

## 2022-09-13 DIAGNOSIS — Z7982 Long term (current) use of aspirin: Secondary | ICD-10-CM

## 2022-09-13 DIAGNOSIS — Z716 Tobacco abuse counseling: Secondary | ICD-10-CM

## 2022-09-13 DIAGNOSIS — Z720 Tobacco use: Secondary | ICD-10-CM | POA: Diagnosis not present

## 2022-09-13 DIAGNOSIS — R000 Tachycardia, unspecified: Secondary | ICD-10-CM | POA: Diagnosis not present

## 2022-09-13 DIAGNOSIS — Z888 Allergy status to other drugs, medicaments and biological substances status: Secondary | ICD-10-CM | POA: Diagnosis not present

## 2022-09-13 DIAGNOSIS — R008 Other abnormalities of heart beat: Secondary | ICD-10-CM | POA: Diagnosis present

## 2022-09-13 DIAGNOSIS — Z9049 Acquired absence of other specified parts of digestive tract: Secondary | ICD-10-CM | POA: Diagnosis not present

## 2022-09-13 DIAGNOSIS — Z83438 Family history of other disorder of lipoprotein metabolism and other lipidemia: Secondary | ICD-10-CM

## 2022-09-13 DIAGNOSIS — Z79899 Other long term (current) drug therapy: Secondary | ICD-10-CM | POA: Diagnosis not present

## 2022-09-13 DIAGNOSIS — E785 Hyperlipidemia, unspecified: Secondary | ICD-10-CM | POA: Diagnosis present

## 2022-09-13 DIAGNOSIS — C3492 Malignant neoplasm of unspecified part of left bronchus or lung: Secondary | ICD-10-CM | POA: Diagnosis present

## 2022-09-13 DIAGNOSIS — R0689 Other abnormalities of breathing: Secondary | ICD-10-CM | POA: Diagnosis not present

## 2022-09-13 DIAGNOSIS — E1169 Type 2 diabetes mellitus with other specified complication: Secondary | ICD-10-CM | POA: Diagnosis present

## 2022-09-13 DIAGNOSIS — R54 Age-related physical debility: Secondary | ICD-10-CM | POA: Diagnosis present

## 2022-09-13 DIAGNOSIS — J22 Unspecified acute lower respiratory infection: Secondary | ICD-10-CM | POA: Diagnosis present

## 2022-09-13 DIAGNOSIS — Z955 Presence of coronary angioplasty implant and graft: Secondary | ICD-10-CM

## 2022-09-13 DIAGNOSIS — Z833 Family history of diabetes mellitus: Secondary | ICD-10-CM

## 2022-09-13 DIAGNOSIS — R062 Wheezing: Secondary | ICD-10-CM | POA: Diagnosis not present

## 2022-09-13 DIAGNOSIS — G43909 Migraine, unspecified, not intractable, without status migrainosus: Secondary | ICD-10-CM | POA: Diagnosis present

## 2022-09-13 DIAGNOSIS — R0600 Dyspnea, unspecified: Secondary | ICD-10-CM | POA: Diagnosis not present

## 2022-09-13 DIAGNOSIS — Z818 Family history of other mental and behavioral disorders: Secondary | ICD-10-CM | POA: Diagnosis not present

## 2022-09-13 DIAGNOSIS — Z7984 Long term (current) use of oral hypoglycemic drugs: Secondary | ICD-10-CM

## 2022-09-13 DIAGNOSIS — R0902 Hypoxemia: Secondary | ICD-10-CM | POA: Diagnosis not present

## 2022-09-13 DIAGNOSIS — B349 Viral infection, unspecified: Secondary | ICD-10-CM | POA: Diagnosis present

## 2022-09-13 DIAGNOSIS — J441 Chronic obstructive pulmonary disease with (acute) exacerbation: Secondary | ICD-10-CM

## 2022-09-13 DIAGNOSIS — F419 Anxiety disorder, unspecified: Secondary | ICD-10-CM | POA: Diagnosis present

## 2022-09-13 DIAGNOSIS — D72829 Elevated white blood cell count, unspecified: Secondary | ICD-10-CM | POA: Diagnosis present

## 2022-09-13 DIAGNOSIS — F3342 Major depressive disorder, recurrent, in full remission: Secondary | ICD-10-CM | POA: Diagnosis not present

## 2022-09-13 DIAGNOSIS — Z825 Family history of asthma and other chronic lower respiratory diseases: Secondary | ICD-10-CM

## 2022-09-13 DIAGNOSIS — Z803 Family history of malignant neoplasm of breast: Secondary | ICD-10-CM

## 2022-09-13 DIAGNOSIS — Z881 Allergy status to other antibiotic agents status: Secondary | ICD-10-CM

## 2022-09-13 DIAGNOSIS — Z7902 Long term (current) use of antithrombotics/antiplatelets: Secondary | ICD-10-CM

## 2022-09-13 LAB — COMPREHENSIVE METABOLIC PANEL
ALT: <5 U/L (ref 0–44)
AST: 14 U/L — ABNORMAL LOW (ref 15–41)
Albumin: 3.5 g/dL (ref 3.5–5.0)
Alkaline Phosphatase: 67 U/L (ref 38–126)
Anion gap: 8 (ref 5–15)
BUN: 16 mg/dL (ref 8–23)
CO2: 44 mmol/L — ABNORMAL HIGH (ref 22–32)
Calcium: 9.3 mg/dL (ref 8.9–10.3)
Chloride: 88 mmol/L — ABNORMAL LOW (ref 98–111)
Creatinine, Ser: 0.52 mg/dL (ref 0.44–1.00)
GFR, Estimated: >60 mL/min (ref >60)
Glucose, Bld: 165 mg/dL — ABNORMAL HIGH (ref 70–99)
Potassium: 4.3 mmol/L (ref 3.5–5.1)
Sodium: 140 mmol/L (ref 135–145)
Total Bilirubin: 0.5 mg/dL (ref 0.3–1.2)
Total Protein: 6.7 g/dL (ref 6.5–8.1)

## 2022-09-13 LAB — CBG MONITORING, ED
Glucose-Capillary: 211 mg/dL — ABNORMAL HIGH (ref 70–99)
Glucose-Capillary: 179 mg/dL — ABNORMAL HIGH (ref 70–99)

## 2022-09-13 LAB — CBC WITH DIFFERENTIAL/PLATELET
Abs Immature Granulocytes: 0.2 10*3/uL — ABNORMAL HIGH (ref 0.00–0.07)
Basophils Absolute: 0 10*3/uL (ref 0.0–0.1)
Basophils Relative: 0 %
Eosinophils Absolute: 0 10*3/uL (ref 0.0–0.5)
Eosinophils Relative: 0 %
HCT: 31.6 % — ABNORMAL LOW (ref 36.0–46.0)
Hemoglobin: 9.1 g/dL — ABNORMAL LOW (ref 12.0–15.0)
Immature Granulocytes: 2 %
Lymphocytes Relative: 5 %
Lymphs Abs: 0.6 10*3/uL — ABNORMAL LOW (ref 0.7–4.0)
MCH: 27.9 pg (ref 26.0–34.0)
MCHC: 28.8 g/dL — ABNORMAL LOW (ref 30.0–36.0)
MCV: 96.9 fL (ref 80.0–100.0)
Monocytes Absolute: 0.8 10*3/uL (ref 0.1–1.0)
Monocytes Relative: 6 %
Neutro Abs: 10.7 10*3/uL — ABNORMAL HIGH (ref 1.7–7.7)
Neutrophils Relative %: 87 %
Platelets: 332 10*3/uL (ref 150–400)
RBC: 3.26 MIL/uL — ABNORMAL LOW (ref 3.87–5.11)
RDW: 14.2 % (ref 11.5–15.5)
WBC: 12.3 10*3/uL — ABNORMAL HIGH (ref 4.0–10.5)
nRBC: 0.2 % (ref 0.0–0.2)

## 2022-09-13 LAB — SARS CORONAVIRUS 2 BY RT PCR: SARS Coronavirus 2 by RT PCR: NEGATIVE

## 2022-09-13 LAB — PROCALCITONIN: Procalcitonin: <0.1 ng/mL

## 2022-09-13 LAB — TROPONIN I (HIGH SENSITIVITY): Troponin I (High Sensitivity): 15 ng/L (ref <18)

## 2022-09-13 LAB — BRAIN NATRIURETIC PEPTIDE: B Natriuretic Peptide: 770.7 pg/mL — ABNORMAL HIGH (ref 0.0–100.0)

## 2022-09-13 MED ORDER — FLUTICASONE FUROATE-VILANTEROL 100-25 MCG/ACT IN AEPB
1.0000 | INHALATION_SPRAY | Freq: Every day | RESPIRATORY_TRACT | Status: DC
  Administered 2022-09-15 – 2022-09-20 (×6): 1 via RESPIRATORY_TRACT
  Filled 2022-09-13: qty 28

## 2022-09-13 MED ORDER — IPRATROPIUM-ALBUTEROL 0.5-2.5 (3) MG/3ML IN SOLN
3.0000 mL | Freq: Once | RESPIRATORY_TRACT | Status: AC
  Administered 2022-09-13: 3 mL via RESPIRATORY_TRACT
  Filled 2022-09-13: qty 3

## 2022-09-13 MED ORDER — FLUTICASONE FUROATE-VILANTEROL 100-25 MCG/ACT IN AEPB: 1.0000 | INHALATION_SPRAY | Freq: Every day | RESPIRATORY_TRACT | Status: DC

## 2022-09-13 MED ORDER — ISOSORBIDE MONONITRATE ER 30 MG PO TB24
30.0000 mg | ORAL_TABLET | Freq: Every day | ORAL | Status: DC
  Administered 2022-09-15 – 2022-09-20 (×6): 30 mg via ORAL
  Filled 2022-09-13 (×6): qty 1

## 2022-09-13 MED ORDER — ACETAMINOPHEN 325 MG PO TABS: 650.0000 mg | ORAL_TABLET | Freq: Four times a day (QID) | ORAL | Status: AC | PRN

## 2022-09-13 MED ORDER — ASPIRIN 81 MG PO CHEW
81.0000 mg | CHEWABLE_TABLET | Freq: Every day | ORAL | Status: DC
  Administered 2022-09-15 – 2022-09-20 (×6): 81 mg via ORAL
  Filled 2022-09-13 (×6): qty 1

## 2022-09-13 MED ORDER — METOPROLOL TARTRATE 25 MG PO TABS
25.0000 mg | ORAL_TABLET | Freq: Two times a day (BID) | ORAL | Status: DC
  Administered 2022-09-13: 25 mg via ORAL
  Filled 2022-09-13: qty 1

## 2022-09-13 MED ORDER — VANCOMYCIN HCL 1750 MG/350ML IV SOLN: 1750.0000 mg | Freq: Once | INTRAVENOUS | Status: DC

## 2022-09-13 MED ORDER — METHYLPREDNISOLONE SODIUM SUCC 125 MG IJ SOLR
125.0000 mg | Freq: Once | INTRAMUSCULAR | Status: DC
  Filled 2022-09-13: qty 2

## 2022-09-13 MED ORDER — ENOXAPARIN SODIUM 60 MG/0.6ML IJ SOSY
0.5000 mg/kg | PREFILLED_SYRINGE | INTRAMUSCULAR | Status: DC
  Administered 2022-09-13 – 2022-09-19 (×7): 42.5 mg via SUBCUTANEOUS
  Filled 2022-09-13 (×7): qty 0.6

## 2022-09-13 MED ORDER — ONDANSETRON HCL 4 MG PO TABS: 4.0000 mg | ORAL_TABLET | Freq: Four times a day (QID) | ORAL | Status: AC | PRN

## 2022-09-13 MED ORDER — VENLAFAXINE HCL ER 75 MG PO CP24
75.0000 mg | ORAL_CAPSULE | Freq: Every day | ORAL | Status: DC
  Administered 2022-09-15 – 2022-09-20 (×6): 75 mg via ORAL
  Filled 2022-09-13 (×7): qty 1

## 2022-09-13 MED ORDER — SODIUM CHLORIDE 0.9 % IV SOLN
500.0000 mg | Freq: Once | INTRAVENOUS | Status: AC
  Administered 2022-09-13: 500 mg via INTRAVENOUS
  Filled 2022-09-13: qty 5

## 2022-09-13 MED ORDER — ACETAMINOPHEN 650 MG RE SUPP: 650.0000 mg | Freq: Four times a day (QID) | RECTAL | Status: AC | PRN

## 2022-09-13 MED ORDER — PRAVASTATIN SODIUM 40 MG PO TABS
80.0000 mg | ORAL_TABLET | Freq: Every evening | ORAL | Status: DC
  Administered 2022-09-13 – 2022-09-19 (×7): 80 mg via ORAL
  Filled 2022-09-13 (×2): qty 2
  Filled 2022-09-13: qty 4
  Filled 2022-09-13 (×4): qty 2

## 2022-09-13 MED ORDER — METHYLPREDNISOLONE SODIUM SUCC 40 MG IJ SOLR
40.0000 mg | Freq: Two times a day (BID) | INTRAMUSCULAR | Status: AC
  Administered 2022-09-14 (×2): 40 mg via INTRAVENOUS
  Filled 2022-09-13 (×2): qty 1

## 2022-09-13 MED ORDER — ONDANSETRON HCL 4 MG/2ML IJ SOLN: 4.0000 mg | Freq: Four times a day (QID) | INTRAMUSCULAR | Status: AC | PRN

## 2022-09-13 MED ORDER — INSULIN ASPART 100 UNIT/ML IJ SOLN
0.0000 [IU] | Freq: Three times a day (TID) | INTRAMUSCULAR | Status: DC
  Administered 2022-09-13: 5 [IU] via SUBCUTANEOUS
  Administered 2022-09-14: 3 [IU] via SUBCUTANEOUS
  Administered 2022-09-14: 2 [IU] via SUBCUTANEOUS
  Administered 2022-09-15: 11 [IU] via SUBCUTANEOUS
  Administered 2022-09-15: 8 [IU] via SUBCUTANEOUS
  Administered 2022-09-15: 3 [IU] via SUBCUTANEOUS
  Administered 2022-09-16: 11 [IU] via SUBCUTANEOUS
  Administered 2022-09-16: 2 [IU] via SUBCUTANEOUS
  Administered 2022-09-16: 11 [IU] via SUBCUTANEOUS
  Administered 2022-09-17: 15 [IU] via SUBCUTANEOUS
  Administered 2022-09-18: 8 [IU] via SUBCUTANEOUS
  Administered 2022-09-18: 11 [IU] via SUBCUTANEOUS
  Administered 2022-09-19: 8 [IU] via SUBCUTANEOUS
  Administered 2022-09-19: 11 [IU] via SUBCUTANEOUS
  Administered 2022-09-20: 5 [IU] via SUBCUTANEOUS
  Filled 2022-09-13 (×15): qty 1

## 2022-09-13 MED ORDER — UMECLIDINIUM BROMIDE 62.5 MCG/ACT IN AEPB
1.0000 | INHALATION_SPRAY | Freq: Every day | RESPIRATORY_TRACT | Status: DC
  Administered 2022-09-15 – 2022-09-20 (×6): 1 via RESPIRATORY_TRACT
  Filled 2022-09-13: qty 7

## 2022-09-13 MED ORDER — CARBIDOPA-LEVODOPA 25-100 MG PO TABS
1.0000 | ORAL_TABLET | Freq: Three times a day (TID) | ORAL | Status: DC
  Administered 2022-09-13 – 2022-09-20 (×17): 1 via ORAL
  Filled 2022-09-13 (×21): qty 1

## 2022-09-13 MED ORDER — IPRATROPIUM-ALBUTEROL 0.5-2.5 (3) MG/3ML IN SOLN
3.0000 mL | Freq: Three times a day (TID) | RESPIRATORY_TRACT | Status: DC
  Administered 2022-09-13: 3 mL via RESPIRATORY_TRACT
  Filled 2022-09-13: qty 3

## 2022-09-13 MED ORDER — SPIRONOLACTONE 25 MG PO TABS
25.0000 mg | ORAL_TABLET | Freq: Every day | ORAL | Status: DC
  Administered 2022-09-15 – 2022-09-20 (×6): 25 mg via ORAL
  Filled 2022-09-13 (×7): qty 1

## 2022-09-13 MED ORDER — SODIUM CHLORIDE 0.9 % IV SOLN
2.0000 g | Freq: Once | INTRAVENOUS | Status: AC
  Administered 2022-09-13: 2 g via INTRAVENOUS
  Filled 2022-09-13: qty 20

## 2022-09-13 MED ORDER — VENLAFAXINE HCL ER 75 MG PO CP24
150.0000 mg | ORAL_CAPSULE | Freq: Every day | ORAL | Status: DC
  Administered 2022-09-15 – 2022-09-20 (×6): 150 mg via ORAL
  Filled 2022-09-13 (×2): qty 2
  Filled 2022-09-13: qty 1
  Filled 2022-09-13 (×4): qty 2

## 2022-09-13 MED ORDER — PANTOPRAZOLE SODIUM 40 MG PO TBEC
80.0000 mg | DELAYED_RELEASE_TABLET | Freq: Every day | ORAL | Status: DC
  Administered 2022-09-15 – 2022-09-20 (×6): 80 mg via ORAL
  Filled 2022-09-13 (×6): qty 2

## 2022-09-13 MED ORDER — NICOTINE 14 MG/24HR TD PT24
14.0000 mg | MEDICATED_PATCH | Freq: Every day | TRANSDERMAL | Status: DC | PRN
  Administered 2022-09-13: 14 mg via TRANSDERMAL
  Filled 2022-09-13: qty 1

## 2022-09-13 MED ORDER — SODIUM CHLORIDE 0.9 % IV SOLN: 2.0000 g | Freq: Once | INTRAVENOUS | Status: DC

## 2022-09-13 MED ORDER — SENNOSIDES-DOCUSATE SODIUM 8.6-50 MG PO TABS: 1.0000 | ORAL_TABLET | Freq: Every evening | ORAL | Status: DC | PRN

## 2022-09-13 MED ORDER — ENSURE ENLIVE PO LIQD
237.0000 mL | Freq: Two times a day (BID) | ORAL | Status: DC
  Administered 2022-09-15 – 2022-09-20 (×11): 237 mL via ORAL

## 2022-09-13 MED ORDER — AMLODIPINE BESYLATE 10 MG PO TABS
10.0000 mg | ORAL_TABLET | Freq: Every day | ORAL | Status: DC
  Administered 2022-09-15 – 2022-09-17 (×3): 10 mg via ORAL
  Filled 2022-09-13 (×3): qty 1

## 2022-09-13 MED ORDER — UMECLIDINIUM BROMIDE 62.5 MCG/ACT IN AEPB: 1.0000 | INHALATION_SPRAY | Freq: Every day | RESPIRATORY_TRACT | Status: DC

## 2022-09-13 MED ORDER — GABAPENTIN 300 MG PO CAPS
300.0000 mg | ORAL_CAPSULE | Freq: Three times a day (TID) | ORAL | Status: DC
  Administered 2022-09-13 – 2022-09-20 (×18): 300 mg via ORAL
  Filled 2022-09-13 (×18): qty 1

## 2022-09-13 MED ORDER — INSULIN ASPART 100 UNIT/ML IJ SOLN
0.0000 [IU] | Freq: Every day | INTRAMUSCULAR | Status: DC
  Administered 2022-09-18: 3 [IU] via SUBCUTANEOUS
  Filled 2022-09-13 (×2): qty 1

## 2022-09-13 MED ORDER — FLUTICASONE-UMECLIDIN-VILANT 100-62.5-25 MCG/INH IN AEPB: 1.0000 | INHALATION_SPRAY | Freq: Every day | RESPIRATORY_TRACT | Status: DC

## 2022-09-13 NOTE — Hospital Course (Addendum)
Ms. Shamar Gassert is a 71-year-old female with history of COPD current tobacco use, oxygen supplementation at 4 L nasal cannula, GERD, hyperlipidemia, hypertension, who presents to emergency department for chief concerns of shortness of breath. 11/30: afebrile, tachypneic to 29, HR and BP WNL. Mild hyperglycemia, no other concerns on CMP. BNP 770. Procalcitonin <0.10. WBC 12.3, Hgb 9.1. Neg COVID. Per ED documentation, EMS gave patient 1 breathing treatment and gave patient 1 dose of Solu-Medrol 125 mg IV one-time dose. CXR: "Patchy hazy opacity in the peripheral left mid lung, decreased, favor improving pneumonia superimposed on chronic lingular scarring as seen on 07/04/2022 chest CT angiogram study. Similar small left pleural effusion, possibly loculated. Repeat CXR advised in 2-3 months or earlier as clinically warranted." ED treatment: DuoNebs x3, ceftriaxone 2 g one-time dose, azithromycin 500 mg IV one-time dose. Admitted to hpspitalist service.  12/01: overnight/early AM, requiring more O2 and needing BiPap. She is alert on rounds, she is not comfortable w/ the BiPap mask but husband is encouraging her to continue to wear it. Plan CT chest given deterioration and concern for possible loculated effusion on XR. Reexamined in ED later in afternoon - pt reports feeling a bit better. Tachycardic and hypertensive - IV meds while npo on bipap, still tachypneic but SpO2 okay. Lung sounds same. CT chest no PE, (+)new right upper lobe airspace consolidation worrisome for pneumonia.  New patchy and ill-defined nodular densities in the bilateral upper lobes favored as infectious/inflammatory. 12/02: Off BiPap today while awake, still on 6L [place]. Continue current treatment.  12/03-12/04: still needing BiPap overnight. On 6L [facility]NC this morning and into the afternoon. Will ask pulmonary team to evaluate.  12/05: Pulmonology saw pt - Dr Aleskerov recs: suspect this is likely viral LRTI acute exacerbation with  atelectasis. Obtain serum fungitell, legionella ab, strep pneumoniae ur AG, Histoplasma Ur Ag, sputum resp cultures, AFB sputum expectorated specimen, sputum cytology, ESR/CRP. Encourage patient to use incentive spirometer few times each hour while hospitalized.  Add torsemide - allergy is not clinically significant. D/c amlodipine for now to avoid transient hypotension. Add metaneb qid for compressive atelectasis. Needing up to 7L HFNC this afternoon.     Consultants:  none  Procedures: none      ASSESSMENT & PLAN:   Principal Problem:   COPD exacerbation (HCC) Active Problems:   Essential hypertension   Type 2 diabetes mellitus with hyperlipidemia (HCC)   Tobacco use   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HCC)   MDD (major depressive disorder), recurrent, in full remission (HCC)   HLD (hyperlipidemia)   RLS (restless legs syndrome)  COPD exacerbation (HCC) Presumed secondary to continued tobacco use, complicated by recent exposure to a young child w/ respiratory illness Off BiPap during the day, still needing 6-7L [place] while awake  DuoNebs Solu-Medrol 40 mg IV twice daily, 2 doses ordered for 09/14/2022 and will transition to po after that  Procalcitonin was negative, but given worsening and CT concerning for PNA will continue abx w/ azithro/ceftriax slow to improve, may need BiPap at home - plan pulmonary consult  Pulmonary recs: suspect this is likely viral LRTI acute exacerbation with atelectasis.  Obtain serum fungitell, legionella ab, strep pneumoniae ur AG, Histoplasma Ur Ag, sputum resp cultures, AFB sputum, expectorated specimen, sputum cytology, ESR/CRP.  Encourage patient to use incentive spirometer few times each hour while hospitalized.   Add torsemide - allergy is not clinically significant. D/c amlodipine for now to avoid transient hypotension.  Add metaneb qid for compressive   atelectasis.   HLD (hyperlipidemia) Pravastatin 80 mg q. evening resumed   MDD  (major depressive disorder), recurrent, in full remission (HCC) Venlafaxine 225 mg daily resumed   Tobacco use Nicotine as needed Patch ordered   Type 2 diabetes mellitus with hyperlipidemia (HCC) Home metformin not resumed on admission Insulin SSI with at bedtime coverage ordered Goal inpatient blood glucose level is 140-180   Essential hypertension Amlodipine 10 mg daily, metoprolol tartrate 25 mg p.o. twice daily, spironolactone 25 mg daily, isosorbide mononitrate 30 mg daily resumed       DVT prophylaxis: Lovenox  Pertinent IV fluids/nutrition: no continuous IV fluids Central lines / invasive devices: none   Code Status: FULL CODE   Disposition: inpatient  TOC needs: none at this time, may need STR/HH and new DME  Barriers to discharge / significant pending items: O2 requirement w/ BiPap support

## 2022-09-13 NOTE — Assessment & Plan Note (Addendum)
-   Home metformin not resumed on admission - Insulin SSI with at bedtime coverage ordered - Goal inpatient blood glucose level is 140-180

## 2022-09-13 NOTE — Assessment & Plan Note (Signed)
-   Venlafaxine 225 mg daily resumed

## 2022-09-13 NOTE — ED Notes (Signed)
Pt oxygenation on 4 liters at 85-88%. This RN increased to 91% on 6 liters. Will make MD aware.

## 2022-09-13 NOTE — Assessment & Plan Note (Addendum)
-   Presumed secondary to continued tobacco use, complicated by recent exposure to a young child - DuoNebs, 3 times daily, 3 doses ordered - Solu-Medrol 40 mg IV twice daily, 2 doses ordered for 09/14/2022 - Procalcitonin is negative, at this time I do not see an indication for continued antibiotic - AM team to initiate antibiotic if indicated

## 2022-09-13 NOTE — ED Provider Notes (Signed)
[facility] Provider Note    Event Date/Time   First MD Initiated Contact with Patient 09/13/22 1130     (approximate)   History   Shortness of Breath   HPI  Suzanne Glenn is a 71 y.o. female here with shortness of breath.  The patient states that she has had increasing shortness of breath over the last several days.  She had increased sputum which is milky white.  She states that she saw a grandchild around Thanksgiving and she believes that child may have been sick.  She has a history of COPD and is on chronic 4 L nasal cannula.  Over last 24 hours her shortness of breath is significantly worse and above its baseline.  She is been using her breathing treatments without significantly.  No fevers or chills.  No leg swelling.     Physical Exam   Triage Vital Signs: ED Triage Vitals [09/13/22 1121]  Enc Vitals Group     BP      Pulse      Resp      Temp      Temp src      SpO2      Weight 185 lb (83.9 kg)     Height 5\' 3"  (1.6 m)     Head Circumference      Peak Flow      Pain Score 0     Pain Loc      Pain Edu?      Excl. in [place]?     Most recent vital signs: Vitals:   09/13/22 1900 09/13/22 1933  BP: 111/76 136/62  Pulse: (!) 101 99  Resp: (!) 22 20  Temp:  97.9 F (36.6 C)  SpO2: 98% 100%     General: Awake, no distress.  CV:  Good peripheral perfusion.  Regular rate and rhythm.  No murmurs rubs. Resp:  Increased work of breathing with diffuse wheezes and diminished aeration. Abd:  No distention.  Other:  Trace edema bilateral lower extremities.   ED Results / Procedures / Treatments   Labs (all labs ordered are listed, but only abnormal results are displayed) Labs Reviewed  CBC WITH DIFFERENTIAL/PLATELET - Abnormal; Notable for the following components:      Result Value   WBC 12.3 (*)    RBC 3.26 (*)    Hemoglobin 9.1 (*)    HCT 31.6 (*)    MCHC 28.8 (*)    Neutro Abs 10.7 (*)    Lymphs Abs 0.6 (*)    Abs Immature  Granulocytes 0.20 (*)    All other components within normal limits  COMPREHENSIVE METABOLIC PANEL - Abnormal; Notable for the following components:   Chloride 88 (*)    CO2 44 (*)    Glucose, Bld 165 (*)    AST 14 (*)    All other components within normal limits  BRAIN NATRIURETIC PEPTIDE - Abnormal; Notable for the following components:   B Natriuretic Peptide 770.7 (*)    All other components within normal limits  CBG MONITORING, ED - Abnormal; Notable for the following components:   Glucose-Capillary 211 (*)    All other components within normal limits  SARS CORONAVIRUS 2 BY RT PCR  CULTURE, BLOOD (SINGLE)  GASTROINTESTINAL PANEL BY PCR, STOOL (REPLACES STOOL CULTURE)  C DIFFICILE QUICK SCREEN W PCR REFLEX    PROCALCITONIN  BASIC METABOLIC PANEL  CBC  TROPONIN I (HIGH SENSITIVITY)     EKG Sinus tachycardia,  ventricular rate 100.  PR 160, QRS 114, QTc 400.  No acute ST elevations or pression.  No acute evidence of acute ischemic infarct.   RADIOLOGY Chest x-ray: Patchy hazy opacity in the peripheral left midlung   I also independently reviewed and agree with radiologist interpretations.   PROCEDURES:  Critical Care performed: Yes, see critical care procedure note(s)  .Critical Care  Performed by: Duffy Bruce, MD Authorized by: Duffy Bruce, MD   Critical care provider statement:    Critical care time (minutes):  30   Critical care time was exclusive of:  Separately billable procedures and treating other patients   Critical care was necessary to treat or prevent imminent or life-threatening deterioration of the following conditions:  Cardiac failure, circulatory failure and respiratory failure   Critical care was time spent personally by me on the following activities:  Development of treatment plan with patient or surrogate, discussions with consultants, evaluation of patient's response to treatment, examination of patient, ordering and review of laboratory  studies, ordering and review of radiographic studies, ordering and performing treatments and interventions, pulse oximetry, re-evaluation of patient's condition and review of old [place] ED: Medications  methylPREDNISolone sodium succinate (SOLU-MEDROL) 125 mg/2 mL injection 125 mg (125 mg Intravenous Not Given 09/13/22 1231)  acetaminophen (TYLENOL) tablet 650 mg (has no administration in time range)    Or  acetaminophen (TYLENOL) suppository 650 mg (has no administration in time range)  ondansetron (ZOFRAN) tablet 4 mg (has no administration in time range)    Or  ondansetron (ZOFRAN) injection 4 mg (has no administration in time range)  enoxaparin (LOVENOX) injection 42.5 mg (42.5 mg Subcutaneous Given 09/13/22 2137)  senna-docusate (Senokot-S) tablet 1 tablet (has no administration in time range)  ipratropium-albuterol (DUONEB) 0.5-2.5 (3) MG/3ML nebulizer solution 3 mL (3 mLs Nebulization Given 09/13/22 2136)  nicotine (NICODERM CQ - dosed in mg/24 hours) patch 14 mg (14 mg Transdermal Patch Applied 09/13/22 2137)  insulin aspart (novoLOG) injection 0-15 Units (5 Units Subcutaneous Given 09/13/22 1820)  insulin aspart (novoLOG) injection 0-5 Units ( Subcutaneous Not Given 09/13/22 2143)  aspirin chewable tablet 81 mg (has no administration in time range)  amLODipine (NORVASC) tablet 10 mg (has no administration in time range)  isosorbide mononitrate (IMDUR) 24 hr tablet 30 mg (has no administration in time range)  metoprolol tartrate (LOPRESSOR) tablet 25 mg (25 mg Oral Given 09/13/22 2137)  pravastatin (PRAVACHOL) tablet 80 mg (80 mg Oral Given 09/13/22 1748)  spironolactone (ALDACTONE) tablet 25 mg (has no administration in time range)  venlafaxine XR (EFFEXOR-XR) 24 hr capsule 150 mg (has no administration in time range)  venlafaxine XR (EFFEXOR-XR) 24 hr capsule 75 mg (has no administration in time range)  pantoprazole (PROTONIX) EC tablet 80 mg (has no  administration in time range)  carbidopa-levodopa (SINEMET IR) 25-100 MG per tablet immediate release 1 tablet (0 tablets Oral Hold 09/13/22 2138)  gabapentin (NEURONTIN) capsule 300 mg (300 mg Oral Given 09/13/22 2137)  feeding supplement (ENSURE ENLIVE / ENSURE PLUS) liquid 237 mL (237 mLs Oral Not Given 09/13/22 1512)  methylPREDNISolone sodium succinate (SOLU-MEDROL) 40 mg/mL injection 40 mg (has no administration in time range)  fluticasone furoate-vilanterol (BREO ELLIPTA) 100-25 MCG/ACT 1 puff (has no administration in time range)    And  umeclidinium bromide (INCRUSE ELLIPTA) 62.5 MCG/ACT 1 puff (has no administration in time range)  ipratropium-albuterol (DUONEB) 0.5-2.5 (3) MG/3ML nebulizer solution 3 mL (3 mLs Nebulization Given 09/13/22 1228)  ipratropium-albuterol (DUONEB) 0.5-2.5 (3) MG/3ML nebulizer solution 3 mL (3 mLs Nebulization Given 09/13/22 1228)  ipratropium-albuterol (DUONEB) 0.5-2.5 (3) MG/3ML nebulizer solution 3 mL (3 mLs Nebulization Given 09/13/22 1227)  cefTRIAXone (ROCEPHIN) 2 g in sodium chloride 0.9 % 100 mL IVPB (0 g Intravenous Stopped 09/13/22 1505)  azithromycin (ZITHROMAX) 500 mg in sodium chloride 0.9 % 250 mL IVPB (0 mg Intravenous Stopped 09/13/22 1725)     IMPRESSION / MDM / ASSESSMENT AND PLAN / ED COURSE  I reviewed the triage vital signs and the nursing notes.                              Differential diagnosis includes, but is not limited to, CAP, COPD exacerbation, CHF, CAD, anemia.   Patient's presentation is most consistent with acute presentation with potential threat to life or bodily function.  The patient is on the cardiac monitor to evaluate for evidence of arrhythmia and/or significant heart rate changes.  71 yo F with h/o COPD, chronic hypoxia, recent PNA, here with cough and SOB. Exam and history is c/w recurrent COPD vs ongoing pneumonia. CBC with persistent leukocytosis and WBC 12.3. CMP unremarkable. Trop neg, EKG Nonischemic. CXR  shows persistent hazy opacity in peripheral left mid lung, ? Improving PNA. Procal is negative. Suspect sx are primarily 2/2 COPD. IV steroids, duonebs x 3, and empiric ABX started. Admit to medicine. Discussed with Dr. Tobie Poet who will admit.   FINAL CLINICAL IMPRESSION(S) / ED DIAGNOSES   Final diagnoses:  Acute on chronic hypoxic respiratory failure (HCC)  COPD exacerbation ([place])     Rx / DC Orders   ED Discharge Orders     None        Note:  This document was prepared using Dragon voice recognition software and may include unintentional dictation errors.   Duffy Bruce, MD 09/13/22 2150

## 2022-09-13 NOTE — ED Triage Notes (Signed)
Pt BIB [facility] from home. Pt on chronic O2 at 2 liters. COPD hx. Diagnosed pneumonia last week. Finished her prescribed prednisone already. EMS advised pt was SOB during walking downstairs to stretcher. She got 1 breathing treatment, 125 mg solumedrol. 20G LFA. Pt denies CP. No productive cough at this point.  HR 96

## 2022-09-13 NOTE — Progress Notes (Signed)
       CROSS COVER NOTE  NAME: Suzanne Glenn MRN: [id number] DOB : 1951/07/15    Time of Service   2352 pm  HPI/Events of Note   Medsurg floor unable to accommodate patients oxygen need of 10 L bubble HFNC  Assessment and  Interventions   Assessment: VSS. Resting comfortably Plan: Change observation unit unit to progressive        Kathlene Cote NP [facility]

## 2022-09-13 NOTE — Assessment & Plan Note (Addendum)
-   Nicotine as needed Patch ordered

## 2022-09-13 NOTE — Assessment & Plan Note (Signed)
-   Pravastatin 80 mg q. evening resumed

## 2022-09-13 NOTE — Assessment & Plan Note (Signed)
-   Amlodipine 10 mg daily, metoprolol tartrate 25 mg p.o. twice daily, spironolactone 25 mg daily, isosorbide mononitrate 30 mg daily resumed

## 2022-09-13 NOTE — H&P (Signed)
History and Physical   Suzanne Glenn:[id number] DOB: 11-15-1950 DOA: 09/13/2022  PCP: Tracie Harrier, MD  Patient coming from: home  I have personally briefly reviewed patient's old medical records in [place].  Chief Concern: Shortness of breath  HPI: Suzanne Glenn is a 71 year old female with history of COPD current tobacco use, oxygen supplementation at 4 L nasal cannula, GERD, hyperlipidemia, hypertension, who presents to emergency department for chief concerns of shortness of breath.  Vitals in the ED showed temperature of 98.3, respiration rate of 29, heart rate of 82, blood pressure 136/98 SpO2 100% on room air  Serum sodium is 140, potassium 4.3, chloride 88, bicarb 44, BUN of 16, serum creatinine 0.52, nonfasting blood glucose is 165, EGFR greater than 60, WBC 12.3, hemoglobin 9.1, platelets of 332.  Per ED documentation, EMS gave patient 1 breathing treatment and gave patient 1 dose of Solu-Medrol 125 mg IV one-time dose.  ED treatment: DuoNebs x3, ceftriaxone 2 g one-time dose, azithromycin 500 mg IV one-time dose. ------------------- At bedside, she was able to tell me her name, age, current location.   She uses 4 L [place] at baseline and since yesterday, she has had increased shortness of breath and productive cough. Her sputum is milky white.   She reports she saw her young [place] on 09/12/22 and Thanksgiving.  She has not aware grandchild has had any symptoms.  She further endorses/20 06/2022, she developed diarrhea, 4 episodes of brown watery bowel movements.  She denies blood in it or black bowel movements.  She reports the shortness of breath is worse with exertion and she endorses occasional wheezing. She states that she has quit smoking as of 09/12/2022.  Social history: She currently smokes 2 packs per week. She lives with her husband. She denies etoh and recreational drug use. She is retired and formerly made milk jugs.   ROS: Constitutional:  no weight change, no fever ENT/Mouth: no sore throat, no rhinorrhea Eyes: no eye pain, no vision changes Cardiovascular: no chest pain, + dyspnea,  no edema, no palpitations Respiratory: + cough, + sputum, no wheezing Gastrointestinal: no nausea, no vomiting, no diarrhea, no constipation Genitourinary: no urinary incontinence, no dysuria, no hematuria Musculoskeletal: no arthralgias, no myalgias Skin: no skin lesions, no pruritus, Neuro: + weakness, no loss of consciousness, no syncope Psych: no anxiety, no depression, + decrease appetite Heme/Lymph: no bruising, no bleeding  ED Course: Discussed with emergency medicine provider, patient requiring hospitalization for chief concerns of COPD exacerbation.  Assessment/Plan  Principal Problem:   COPD exacerbation (HCC) Active Problems:   Essential hypertension   Type 2 diabetes mellitus with hyperlipidemia (HCC)   Tobacco use   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HCC)   MDD (major depressive disorder), recurrent, in full remission ([place])   HLD (hyperlipidemia)   RLS (restless legs syndrome)   Assessment and Plan:  * COPD exacerbation ([place]) - Presumed secondary to continued tobacco use, complicated by recent exposure to a young child - DuoNebs, 3 times daily, 3 doses ordered - Solu-Medrol 40 mg IV twice daily, 2 doses ordered for 09/14/2022 - Procalcitonin is negative, at this time I do not see an indication for continued antibiotic - AM team to initiate antibiotic if indicated  HLD (hyperlipidemia) - Pravastatin 80 mg q. evening resumed  MDD (major depressive disorder), recurrent, in full remission ([place]) - Venlafaxine 225 mg daily resumed  Tobacco use - Nicotine as needed Patch ordered  Type 2 diabetes mellitus with hyperlipidemia (  [place]) - Home metformin not resumed on admission - Insulin SSI with at bedtime coverage ordered - Goal inpatient blood glucose level is 140-180  Essential hypertension - Amlodipine 10 mg  daily, metoprolol tartrate 25 mg p.o. twice daily, spironolactone 25 mg daily, isosorbide mononitrate 30 mg daily resumed  Chart reviewed.   DVT prophylaxis: Enoxaparin Code Status: Full code Diet: Heart healthy Family Communication: No Disposition Plan: Pending clinical course Consults called: None at this time Admission status: MedSurg, observation  Past Medical History:  Diagnosis Date   Anxiety    Asthma    Cancer ([place]) 12/2018   w/u for right upper lobe mass/cancer   COPD (chronic obstructive pulmonary disease) ([place])    also, emphysema. now using o2 via np 24 hours a day   Coronary artery disease    Depression    Diabetes mellitus, type II (HCC)    GERD (gastroesophageal reflux disease)    Headache    migraines in early 20's   Heart murmur    HLD (hyperlipidemia)    Hypertension    Lung cancer ([place])    Neuropathy    Restless leg    Past Surgical History:  Procedure Laterality Date   BACK SURGERY  2005   surgery x 2, disc fused in neck, pinched nerve in center of back and neck   CARDIAC CATHETERIZATION  2015   1 stent placed for blockage   cervical bone infusion     CHOLECYSTECTOMY     DILATION AND CURETTAGE OF UTERUS     FOOT SURGERY Left    foot bone spur removed   THORACIC DISC SURGERY     TUBAL LIGATION     VIDEO BRONCHOSCOPY WITH ENDOBRONCHIAL ULTRASOUND N/A 03/06/2019   Procedure: VIDEO BRONCHOSCOPY WITH ENDOBRONCHIAL ULTRASOUND - DIABETIC;  Surgeon: Ottie Glazier, MD;  Location: [facility] ORS;  Service: Thoracic;  Laterality: N/A;   Social History:  reports that she has been smoking cigarettes. She started smoking about 47 years ago. She has a 40.00 pack-year smoking history. She has never used smokeless tobacco. She reports that she does not drink alcohol and does not use drugs.  Allergies  Allergen Reactions   Diclofenac-Misoprostol Other (See Comments)    Arthrotec - gastritis    Nsaids Other (See Comments)    gastritis   Zolpidem Other (See  Comments)    Sleep walking    Hydrocodone-Acetaminophen Nausea And Vomiting   Simvastatin Other (See Comments)    body aches   Family History  Problem Relation Age of Onset   Anxiety disorder Mother    Cancer - Lung Mother    Depression Sister    Cancer Sister    Cancer Sister    Cancer Sister    Cancer Sister    Heart Problems Sister    Bipolar disorder Sister    Diabetes Sister    Cancer - Lung Sister    Hypertension Sister    Diabetes Sister    Hyperlipidemia Sister    COPD Sister    Depression Brother    Heart Problems Brother    Cancer Brother    Cancer Brother    Cancer Brother    Breast cancer Maternal Aunt    Depression Son    Family history: Family history reviewed and not pertinent  Prior to Admission medications   Medication Sig Start Date End Date Taking? Authorizing Provider  amLODipine (NORVASC) 10 MG tablet Take 10 mg by mouth daily.    Yes [provider]  aspirin 81 MG chewable tablet Chew by mouth daily.   Yes [provider]  carbidopa-levodopa (SINEMET IR) 25-100 MG tablet Take 1 tablet by mouth 3 (three) times daily.   Yes [provider]  cyanocobalamin 1000 MCG tablet Take 1 tablet by mouth daily.   Yes [provider]  esomeprazole (NEXIUM) 40 MG capsule Take 40 mg by mouth daily.   Yes [provider]  Fluticasone-Umeclidin-Vilant 100-62.5-25 MCG/INH AEPB Inhale 1 puff into the lungs daily. [place] 11/28/18  Yes [provider]  gabapentin (NEURONTIN) 300 MG capsule Take 1 capsule (300 mg total) by mouth 3 (three) times daily. 07/12/22 09/13/22 Yes Hisada, Elie Goody, MD  ipratropium-albuterol (DUONEB) 0.5-2.5 (3) MG/3ML SOLN Take 3 mLs by nebulization every 6 (six) hours. 04/17/21  Yes Sharen Hones, MD  isosorbide mononitrate (IMDUR) 30 MG 24 hr tablet Take 1 tablet by mouth daily. 01/24/22 01/24/23 Yes [provider]  metFORMIN (GLUCOPHAGE) 500 MG tablet Take 1 tablet (500 mg total) by mouth 2  (two) times daily with a meal. 08/22/21  Yes Lorella Nimrod, MD  metoprolol tartrate (LOPRESSOR) 25 MG tablet Take 25 mg by mouth 2 (two) times daily.    Yes [provider]  mirtazapine (REMERON) 7.5 MG tablet Take 1 tablet (7.5 mg total) by mouth at bedtime. 09/22/22 12/21/22 Yes Norman Clay, MD  pravastatin (PRAVACHOL) 80 MG tablet Take 80 mg by mouth every evening.    Yes [provider]  spironolactone (ALDACTONE) 25 MG tablet Take 25 mg by mouth daily.    Yes [provider]  venlafaxine XR (EFFEXOR-XR) 150 MG 24 hr capsule Take 1 capsule (150 mg total) by mouth daily. Take total of 225 mg daily, take along with 75 mg cap 03/07/22 09/13/22 Yes Hisada, Elie Goody, MD  venlafaxine XR (EFFEXOR-XR) 75 MG 24 hr capsule Take 1 capsule (75 mg total) by mouth daily. Take total of 225 mg daily, take along with 150 mg cap 06/05/22 12/02/22 Yes Hisada, Elie Goody, MD  ACCU-CHEK FASTCLIX LANCETS [place]  02/18/15   [provider]  ACCU-CHEK SMARTVIEW test strip  02/18/15   [provider]  albuterol (VENTOLIN HFA) 108 (90 Base) MCG/ACT inhaler Inhale 2 puffs into the lungs every 4 (four) hours as needed for wheezing or shortness of breath.  03/22/14   [provider]  feeding supplement (ENSURE ENLIVE / ENSURE PLUS) LIQD Take 237 mLs by mouth 2 (two) times daily between meals. 08/22/21   Lorella Nimrod, MD  ferrous sulfate 325 (65 FE) MG tablet Take 1 tablet (325 mg total) by mouth 2 (two) times daily with a meal. Patient not taking: Reported on 09/13/2022 01/02/19   Vaughan Basta, MD  OXYGEN Inhale 2 L into the lungs continuous.    [provider]   Physical Exam: Vitals:   09/13/22 1400 09/13/22 1430 09/13/22 1500 09/13/22 1900  BP: (!) 124/90  135/75 111/76  Pulse: 92  90 (!) 101  Resp: (!) 28 (!) 35 20 (!) 22  Temp:   98.3 F (36.8 C)   TempSrc:   Oral   SpO2: 99%  100% 98%  Weight:      Height:       Constitutional: appears age-appropriate,  frail, NAD, calm, comfortable Eyes: PERRL, lids and conjunctivae normal ENMT: Mucous membranes are moist. Posterior pharynx clear of any exudate or lesions. Age-appropriate dentition. Hearing appropriate Neck: normal, supple, no masses, no thyromegaly Respiratory: clear to auscultation bilaterally, no wheezing, no crackles. Normal respiratory effort. No  accessory muscle use.  Cardiovascular: Regular rate and rhythm, no murmurs / rubs / gallops. No extremity edema. 2+ pedal pulses. No carotid bruits.  Abdomen: no tenderness, no masses palpated, no hepatosplenomegaly. Bowel sounds positive.  Musculoskeletal: no clubbing / cyanosis. No joint deformity upper and lower extremities. Good ROM, no contractures, no atrophy. Normal muscle tone.  Skin: no rashes, lesions, ulcers. No induration Neurologic: Sensation intact. Strength 5/5 in all 4.  Psychiatric: Normal judgment and insight. Alert and oriented x 3. Normal mood.   EKG: independently reviewed, showing sinus tachycardia with rate of 100, QTc 400  Chest x-ray on Admission: I personally reviewed and I agree with radiologist reading as below.  DG Chest Portable 1 View  Result Date: 09/13/2022 CLINICAL DATA:  Dyspnea, COPD, recent diagnosis of pneumonia EXAM: PORTABLE CHEST 1 VIEW COMPARISON:  07/30/2022 chest radiograph. FINDINGS: Surgical hardware from ACDF overlies the lower cervical spine. Stable cardiomediastinal silhouette with top-normal heart size. No pneumothorax. Small left pleural effusion, similar, possibly loculated. No significant right pleural effusion. Patchy hazy opacity in the peripheral left mid lung, decreased. No overt pulmonary edema. IMPRESSION: Patchy hazy opacity in the peripheral left mid lung, decreased, favor improving pneumonia superimposed on chronic lingular scarring as seen on 07/04/2022 chest CT angiogram study. Similar small left pleural effusion, possibly loculated. Continued chest radiograph follow-up advised in 2-3  months or earlier as clinically warranted. Electronically Signed   By: Ilona Sorrel M.D.   On: 09/13/2022 12:41    Labs on Admission: I have personally reviewed following labs CBC: Recent Labs  Lab 09/13/22 1126  WBC 12.3*  NEUTROABS 10.7*  HGB 9.1*  HCT 31.6*  MCV 96.9  PLT 814   Basic Metabolic Panel: Recent Labs  Lab 09/13/22 1126  NA 140  K 4.3  CL 88*  CO2 44*  GLUCOSE 165*  BUN 16  CREATININE 0.52  CALCIUM 9.3   GFR: Estimated Creatinine Clearance: 67.1 mL/min (by C-G formula based on SCr of 0.52 mg/dL). Liver Function Tests: Recent Labs  Lab 09/13/22 1126  AST 14*  ALT <5  ALKPHOS 67  BILITOT 0.5  PROT 6.7  ALBUMIN 3.5   CBG: Recent Labs  Lab 09/13/22 1752  GLUCAP 211*   Urine analysis:    Component Value Date/Time   COLORURINE YELLOW (A) 07/30/2022 1230   APPEARANCEUR HAZY (A) 07/30/2022 1230   LABSPEC 1.021 07/30/2022 1230   PHURINE 5.0 07/30/2022 1230   GLUCOSEU NEGATIVE 07/30/2022 1230   HGBUR SMALL (A) 07/30/2022 1230   BILIRUBINUR NEGATIVE 07/30/2022 1230   KETONESUR 5 (A) 07/30/2022 1230   PROTEINUR 100 (A) 07/30/2022 1230   NITRITE NEGATIVE 07/30/2022 1230   LEUKOCYTESUR NEGATIVE 07/30/2022 1230   Dr. Tobie Poet [facility]  If 7PM-7AM, please contact overnight-coverage provider If 7AM-7PM, please contact day coverage provider [email or web address]  09/13/2022, 7:24 PM

## 2022-09-14 ENCOUNTER — Inpatient Hospital Stay: Payer: Medicare HMO

## 2022-09-14 DIAGNOSIS — E785 Hyperlipidemia, unspecified: Secondary | ICD-10-CM | POA: Diagnosis present

## 2022-09-14 DIAGNOSIS — Z720 Tobacco use: Secondary | ICD-10-CM | POA: Diagnosis not present

## 2022-09-14 DIAGNOSIS — E1165 Type 2 diabetes mellitus with hyperglycemia: Secondary | ICD-10-CM | POA: Diagnosis present

## 2022-09-14 DIAGNOSIS — C3492 Malignant neoplasm of unspecified part of left bronchus or lung: Secondary | ICD-10-CM | POA: Diagnosis not present

## 2022-09-14 DIAGNOSIS — I2510 Atherosclerotic heart disease of native coronary artery without angina pectoris: Secondary | ICD-10-CM | POA: Diagnosis present

## 2022-09-14 DIAGNOSIS — Z818 Family history of other mental and behavioral disorders: Secondary | ICD-10-CM | POA: Diagnosis not present

## 2022-09-14 DIAGNOSIS — Z79899 Other long term (current) drug therapy: Secondary | ICD-10-CM | POA: Diagnosis not present

## 2022-09-14 DIAGNOSIS — D509 Iron deficiency anemia, unspecified: Secondary | ICD-10-CM | POA: Diagnosis present

## 2022-09-14 DIAGNOSIS — E1169 Type 2 diabetes mellitus with other specified complication: Secondary | ICD-10-CM | POA: Diagnosis present

## 2022-09-14 DIAGNOSIS — G2581 Restless legs syndrome: Secondary | ICD-10-CM | POA: Diagnosis present

## 2022-09-14 DIAGNOSIS — J181 Lobar pneumonia, unspecified organism: Secondary | ICD-10-CM | POA: Diagnosis not present

## 2022-09-14 DIAGNOSIS — Z85118 Personal history of other malignant neoplasm of bronchus and lung: Secondary | ICD-10-CM | POA: Diagnosis not present

## 2022-09-14 DIAGNOSIS — Z981 Arthrodesis status: Secondary | ICD-10-CM | POA: Diagnosis not present

## 2022-09-14 DIAGNOSIS — Z1152 Encounter for screening for COVID-19: Secondary | ICD-10-CM | POA: Diagnosis not present

## 2022-09-14 DIAGNOSIS — I10 Essential (primary) hypertension: Secondary | ICD-10-CM | POA: Diagnosis present

## 2022-09-14 DIAGNOSIS — J441 Chronic obstructive pulmonary disease with (acute) exacerbation: Secondary | ICD-10-CM | POA: Diagnosis present

## 2022-09-14 DIAGNOSIS — K219 Gastro-esophageal reflux disease without esophagitis: Secondary | ICD-10-CM | POA: Diagnosis present

## 2022-09-14 DIAGNOSIS — J432 Centrilobular emphysema: Secondary | ICD-10-CM | POA: Diagnosis present

## 2022-09-14 DIAGNOSIS — J189 Pneumonia, unspecified organism: Secondary | ICD-10-CM | POA: Diagnosis present

## 2022-09-14 DIAGNOSIS — Z888 Allergy status to other drugs, medicaments and biological substances status: Secondary | ICD-10-CM | POA: Diagnosis not present

## 2022-09-14 DIAGNOSIS — F3342 Major depressive disorder, recurrent, in full remission: Secondary | ICD-10-CM

## 2022-09-14 DIAGNOSIS — J9811 Atelectasis: Secondary | ICD-10-CM | POA: Diagnosis present

## 2022-09-14 DIAGNOSIS — J90 Pleural effusion, not elsewhere classified: Secondary | ICD-10-CM | POA: Diagnosis present

## 2022-09-14 DIAGNOSIS — F17210 Nicotine dependence, cigarettes, uncomplicated: Secondary | ICD-10-CM | POA: Diagnosis present

## 2022-09-14 DIAGNOSIS — Z955 Presence of coronary angioplasty implant and graft: Secondary | ICD-10-CM | POA: Diagnosis not present

## 2022-09-14 DIAGNOSIS — J9621 Acute and chronic respiratory failure with hypoxia: Secondary | ICD-10-CM | POA: Diagnosis present

## 2022-09-14 DIAGNOSIS — E1141 Type 2 diabetes mellitus with diabetic mononeuropathy: Secondary | ICD-10-CM | POA: Diagnosis present

## 2022-09-14 DIAGNOSIS — Z9049 Acquired absence of other specified parts of digestive tract: Secondary | ICD-10-CM | POA: Diagnosis not present

## 2022-09-14 LAB — CBC
HCT: 28.5 % — ABNORMAL LOW (ref 36.0–46.0)
Hemoglobin: 8.3 g/dL — ABNORMAL LOW (ref 12.0–15.0)
MCH: 27.7 pg (ref 26.0–34.0)
MCHC: 29.1 g/dL — ABNORMAL LOW (ref 30.0–36.0)
MCV: 95 fL (ref 80.0–100.0)
Platelets: 349 10*3/uL (ref 150–400)
RBC: 3 MIL/uL — ABNORMAL LOW (ref 3.87–5.11)
RDW: 14.6 % (ref 11.5–15.5)
WBC: 12.5 10*3/uL — ABNORMAL HIGH (ref 4.0–10.5)
nRBC: 0.5 % — ABNORMAL HIGH (ref 0.0–0.2)

## 2022-09-14 LAB — BLOOD GAS, VENOUS
Acid-Base Excess: 18.3 mmol/L — ABNORMAL HIGH (ref 0.0–2.0)
Acid-Base Excess: 21.6 mmol/L — ABNORMAL HIGH (ref 0.0–2.0)
Bicarbonate: 53.6 mmol/L — ABNORMAL HIGH (ref 20.0–28.0)
Bicarbonate: 53.9 mmol/L — ABNORMAL HIGH (ref 20.0–28.0)
O2 Saturation: 75.8 %
O2 Saturation: 50.1 %
Patient temperature: 37
Patient temperature: 37
pCO2, Ven: >123 mmHg (ref 44–60)
pCO2, Ven: 107 mmHg (ref 44–60)
pH, Ven: 7.17 — CL (ref 7.25–7.43)
pH, Ven: 7.31 (ref 7.25–7.43)
pO2, Ven: 49 mmHg — ABNORMAL HIGH (ref 32–45)
pO2, Ven: 32 mmHg (ref 32–45)

## 2022-09-14 LAB — GLUCOSE, CAPILLARY
Glucose-Capillary: 156 mg/dL — ABNORMAL HIGH (ref 70–99)
Glucose-Capillary: 177 mg/dL — ABNORMAL HIGH (ref 70–99)

## 2022-09-14 LAB — BASIC METABOLIC PANEL
Anion gap: 9 (ref 5–15)
BUN: 21 mg/dL (ref 8–23)
CO2: 43 mmol/L — ABNORMAL HIGH (ref 22–32)
Calcium: 9.3 mg/dL (ref 8.9–10.3)
Chloride: 88 mmol/L — ABNORMAL LOW (ref 98–111)
Creatinine, Ser: 0.54 mg/dL (ref 0.44–1.00)
GFR, Estimated: >60 mL/min (ref >60)
Glucose, Bld: 146 mg/dL — ABNORMAL HIGH (ref 70–99)
Potassium: 4.9 mmol/L (ref 3.5–5.1)
Sodium: 140 mmol/L (ref 135–145)

## 2022-09-14 LAB — CBG MONITORING, ED
Glucose-Capillary: 125 mg/dL — ABNORMAL HIGH (ref 70–99)
Glucose-Capillary: 99 mg/dL (ref 70–99)

## 2022-09-14 MED ORDER — IOHEXOL 350 MG/ML SOLN
75.0000 mL | Freq: Once | INTRAVENOUS | Status: AC | PRN
  Administered 2022-09-14: 75 mL via INTRAVENOUS

## 2022-09-14 MED ORDER — IPRATROPIUM-ALBUTEROL 0.5-2.5 (3) MG/3ML IN SOLN
RESPIRATORY_TRACT | Status: AC
  Administered 2022-09-14: 3 mL
  Filled 2022-09-14: qty 3

## 2022-09-14 MED ORDER — SODIUM CHLORIDE 0.9 % IV SOLN
500.0000 mg | INTRAVENOUS | Status: AC
  Administered 2022-09-14 – 2022-09-17 (×4): 500 mg via INTRAVENOUS
  Filled 2022-09-14 (×5): qty 5

## 2022-09-14 MED ORDER — SODIUM CHLORIDE 0.9 % IV SOLN
2.0000 g | INTRAVENOUS | Status: AC
  Administered 2022-09-14 – 2022-09-17 (×4): 2 g via INTRAVENOUS
  Filled 2022-09-14 (×4): qty 20

## 2022-09-14 MED ORDER — METOPROLOL TARTRATE 5 MG/5ML IV SOLN
5.0000 mg | Freq: Three times a day (TID) | INTRAVENOUS | Status: DC
  Administered 2022-09-14 – 2022-09-19 (×15): 5 mg via INTRAVENOUS
  Filled 2022-09-14 (×15): qty 5

## 2022-09-14 MED ORDER — ALPRAZOLAM 0.25 MG PO TABS
0.2500 mg | ORAL_TABLET | Freq: Once | ORAL | Status: AC
  Administered 2022-09-14: 0.25 mg via ORAL
  Filled 2022-09-14: qty 1

## 2022-09-14 MED ORDER — IPRATROPIUM-ALBUTEROL 0.5-2.5 (3) MG/3ML IN SOLN: 3.0000 mL | RESPIRATORY_TRACT | Status: DC | PRN

## 2022-09-14 MED ORDER — HYDRALAZINE HCL 20 MG/ML IJ SOLN: 10.0000 mg | Freq: Three times a day (TID) | INTRAMUSCULAR | Status: DC | PRN

## 2022-09-14 MED ORDER — IPRATROPIUM-ALBUTEROL 0.5-2.5 (3) MG/3ML IN SOLN
3.0000 mL | RESPIRATORY_TRACT | Status: DC
  Administered 2022-09-14 – 2022-09-17 (×21): 3 mL via RESPIRATORY_TRACT
  Filled 2022-09-14 (×15): qty 3
  Filled 2022-09-14: qty 30
  Filled 2022-09-14 (×6): qty 3

## 2022-09-14 NOTE — ED Notes (Signed)
Pt found to have taken off BiPap mask to call out to go to the bathroom. Noted to be 36% on room air with cyanotic lips. BiPap mask was immediately replaced and FiO2 increased to 100% for two minutes for brief oxygenation. SpO2 improved, FiO2 40% as placed by RT, and BiPap mask was secured. Discussed importance of maintaining mask on at all times. Pt verbalized understanding.

## 2022-09-14 NOTE — Progress Notes (Signed)
Reexamined in ED later in afternoon - pt reports feeling a bit better. Tachycardic and hypertensive - IV meds while npo on bipap, still tachypneic but SpO2 okay. Lung sounds same. CT chest pending.

## 2022-09-14 NOTE — ED Notes (Signed)
Assumed care of patient at this time. Report received from [name], [place].

## 2022-09-14 NOTE — ED Notes (Signed)
NP Hassan Rowan and RT at bedside. RT transitioning pt onto BiPap

## 2022-09-14 NOTE — ED Notes (Signed)
Discussed pt with charge RN Caryl Pina. Pt requiring hire acuity. Pt to be moved from C POD room 35 to main ED room 8. Report given to [facility]. RT called for transport.

## 2022-09-14 NOTE — Progress Notes (Signed)
Notified NP Randol Kern about anxiety, tachypnea and tachycardia. Patient has periods of confusion. New orders for an oral dose of xanax and a venous blood gas.

## 2022-09-14 NOTE — ED Notes (Signed)
Pt noted to be 88% while on BiPAP. RT notified.

## 2022-09-14 NOTE — Progress Notes (Signed)
PROGRESS NOTE    HALEA LIEB   HOZ:[id number] DOB: [birth year]  DOA: 09/13/2022 Date of Service: 09/14/22 PCP: Tracie Harrier, MD     Brief Narrative / Hospital Course:  Ms. Steffie Waggoner is a 71 year old female with history of COPD current tobacco use, oxygen supplementation at 4 L nasal cannula, GERD, hyperlipidemia, hypertension, who presents to emergency department for chief concerns of shortness of breath. 11/30: afebrile, tachypneic to 29, HR and BP WNL. Mild hyperglycemia, no other concerns on CMP. BNP 770. Procalcitonin <0.10. WBC 12.3, Hgb 9.1. Neg COVID. Per ED documentation, EMS gave patient 1 breathing treatment and gave patient 1 dose of Solu-Medrol 125 mg IV one-time dose. CXR: "Patchy hazy opacity in the peripheral left mid lung, decreased, favor improving pneumonia superimposed on chronic lingular scarring as seen on 07/04/2022 chest CT angiogram study. Similar small left pleural effusion, possibly loculated. Continued chest radiograph follow-up advised in 2-3 months or earlier as clinically warranted." ED treatment: DuoNebs x3, ceftriaxone 2 g one-time dose, azithromycin 500 mg IV one-time dose. Admitted to hpspitalist service.  12/01: overnight/early AM, requiring more O2 and needing BiPap. She is alert on rounds, she is not comfortable w/ the BiPap mask but husband is encouraging her to continue to wear it. Plan CT chest given deterioration and concern for possible loculated effusion on XR. Restarted abx pending CT     Consultants:  none  Procedures: none      ASSESSMENT & PLAN:   Principal Problem:   COPD exacerbation ([place]) Active Problems:   Essential hypertension   Type 2 diabetes mellitus with hyperlipidemia (HCC)   Tobacco use   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HCC)   MDD (major depressive disorder), recurrent, in full remission ([place])   HLD (hyperlipidemia)   RLS (restless legs syndrome)  COPD exacerbation ([place]) Presumed secondary to  continued tobacco use, complicated by recent exposure to a young child w/ respiratory illness DuoNebs Solu-Medrol 40 mg IV twice daily, 2 doses ordered for 09/14/2022 and will transition to po after that  Procalcitonin is negative, but given worsening will restart abx w/ azithro/ceftriax CT chest to eval for PE as well as effusion   HLD (hyperlipidemia) Pravastatin 80 mg q. evening resumed   MDD (major depressive disorder), recurrent, in full remission (HCC) Venlafaxine 225 mg daily resumed   Tobacco use Nicotine as needed Patch ordered   Type 2 diabetes mellitus with hyperlipidemia ([place]) Home metformin not resumed on admission Insulin SSI with at bedtime coverage ordered Goal inpatient blood glucose level is 140-180   Essential hypertension Amlodipine 10 mg daily, metoprolol tartrate 25 mg p.o. twice daily, spironolactone 25 mg daily, isosorbide mononitrate 30 mg daily resumed       DVT prophylaxis: Lovenox  Pertinent IV fluids/nutrition: no continuous IV fluids Central lines / invasive devices: none   Code Status: FULL CODE   Disposition: obs changed to inpatient, progressive given BiPap need TOC needs: none at this time  Barriers to discharge / significant pending items: O2 requirement w/ BiPap support              Subjective:  Patient reports SOB about the same  Denies CP.  Pain controlled.  Denies new weakness.  Tolerating diet.  Reports no concerns w/ urination/defecation.   Family Communication: husband is at bedside on rounds this AM     Objective Findings:  Vitals:   09/14/22 1155 09/14/22 1230 09/14/22 1300 09/14/22 1315  BP:   (!) 154/125   Pulse:  Marland Kitchen)  52 74 (!) 111  Resp:   (!) 36 (!) 28  Temp:      TempSrc:      SpO2: 94% (!) 87% 96% 98%  Weight:      Height:       No intake or output data in the 24 hours ending 09/14/22 1345 Filed Weights   09/13/22 1121  Weight: 83.9 kg    Examination:  Constitutional:  VS as  above General Appearance: alert, well-developed, well-nourished, NAD Neck: No masses, trachea midline Respiratory: Normal respiratory effort + wheeze + rhonchi No rales Cardiovascular: S1/S2 normal No murmur No lower extremity edema Gastrointestinal: No tenderness Musculoskeletal:  No clubbing/cyanosis of digits Symmetrical movement in all extremities Neurological: No cranial nerve deficit on limited exam Alert Psychiatric: Normal judgment/insight Normal mood and affect       Scheduled Medications:   amLODipine  10 mg Oral Daily   aspirin  81 mg Oral Daily   carbidopa-levodopa  1 tablet Oral TID   enoxaparin (LOVENOX) injection  0.5 mg/kg Subcutaneous Q24H   feeding supplement  237 mL Oral BID BM   fluticasone furoate-vilanterol  1 puff Inhalation Daily   And   umeclidinium bromide  1 puff Inhalation Daily   gabapentin  300 mg Oral TID   insulin aspart  0-15 Units Subcutaneous TID WC   insulin aspart  0-5 Units Subcutaneous QHS   ipratropium-albuterol  3 mL Nebulization Q4H   isosorbide mononitrate  30 mg Oral Daily   methylPREDNISolone (SOLU-MEDROL) injection  125 mg Intravenous Once   methylPREDNISolone (SOLU-MEDROL) injection  40 mg Intravenous BID   metoprolol tartrate  5 mg Intravenous Q8H   pantoprazole  80 mg Oral Q1200   pravastatin  80 mg Oral QPM   spironolactone  25 mg Oral Daily   venlafaxine XR  150 mg Oral Daily   venlafaxine XR  75 mg Oral Daily    Continuous Infusions:  azithromycin (ZITHROMAX) 500 mg in sodium chloride 0.9 % 250 mL IVPB     cefTRIAXone (ROCEPHIN)  IV      PRN Medications:  acetaminophen **OR** acetaminophen, hydrALAZINE, ipratropium-albuterol, nicotine, ondansetron **OR** ondansetron (ZOFRAN) IV, senna-docusate  Antimicrobials:  Anti-infectives (From admission, onward)    Start     Dose/Rate Route Frequency Ordered Stop   09/14/22 1500  azithromycin (ZITHROMAX) 500 mg in sodium chloride 0.9 % 250 mL IVPB        500  mg 250 mL/hr over 60 Minutes Intravenous Every 24 hours 09/14/22 1341 09/18/22 1459   09/14/22 1400  cefTRIAXone (ROCEPHIN) 2 g in sodium chloride 0.9 % 100 mL IVPB        2 g 200 mL/hr over 30 Minutes Intravenous Every 24 hours 09/14/22 1341 09/18/22 1359   09/13/22 1415  vancomycin (VANCOREADY) IVPB 1750 mg/350 mL  Status:  Discontinued        1,750 mg 175 mL/hr over 120 Minutes Intravenous  Once 09/13/22 1343 09/13/22 1344   09/13/22 1345  ceFEPIme (MAXIPIME) 2 g in sodium chloride 0.9 % 100 mL IVPB  Status:  Discontinued        2 g 200 mL/hr over 30 Minutes Intravenous  Once 09/13/22 1343 09/13/22 1344   09/13/22 1345  cefTRIAXone (ROCEPHIN) 2 g in sodium chloride 0.9 % 100 mL IVPB        2 g 200 mL/hr over 30 Minutes Intravenous  Once 09/13/22 1344 09/13/22 1505   09/13/22 1345  azithromycin (ZITHROMAX) 500 mg in sodium chloride 0.9 %  250 mL IVPB        500 mg 250 mL/hr over 60 Minutes Intravenous  Once 09/13/22 1344 09/13/22 1725           Data Reviewed: I have personally reviewed following labs and imaging studies  CBC: Recent Labs  Lab 09/13/22 1126 09/14/22 0645  WBC 12.3* 12.5*  NEUTROABS 10.7*  --   HGB 9.1* 8.3*  HCT 31.6* 28.5*  MCV 96.9 95.0  PLT 332 962   Basic Metabolic Panel: Recent Labs  Lab 09/13/22 1126 09/14/22 0645  NA 140 140  K 4.3 4.9  CL 88* 88*  CO2 44* 43*  GLUCOSE 165* 146*  BUN 16 21  CREATININE 0.52 0.54  CALCIUM 9.3 9.3   GFR: Estimated Creatinine Clearance: 67.1 mL/min (by C-G formula based on SCr of 0.54 mg/dL). Liver Function Tests: Recent Labs  Lab 09/13/22 1126  AST 14*  ALT <5  ALKPHOS 67  BILITOT 0.5  PROT 6.7  ALBUMIN 3.5   No results for input(s): "LIPASE", "AMYLASE" in the last 168 hours. No results for input(s): "AMMONIA" in the last 168 hours. Coagulation Profile: No results for input(s): "INR", "PROTIME" in the last 168 hours. Cardiac Enzymes: No results for input(s): "CKTOTAL", "CKMB", "CKMBINDEX",  "TROPONINI" in the last 168 hours. BNP (last 3 results) No results for input(s): "PROBNP" in the last 8760 hours. HbA1C: No results for input(s): "HGBA1C" in the last 72 hours. CBG: Recent Labs  Lab 09/13/22 1752 09/13/22 2142 09/14/22 0737 09/14/22 1206  GLUCAP 211* 179* 125* 99   Lipid Profile: No results for input(s): "CHOL", "HDL", "LDLCALC", "TRIG", "CHOLHDL", "LDLDIRECT" in the last 72 hours. Thyroid Function Tests: No results for input(s): "TSH", "T4TOTAL", "FREET4", "T3FREE", "THYROIDAB" in the last 72 hours. Anemia Panel: No results for input(s): "VITAMINB12", "FOLATE", "FERRITIN", "TIBC", "IRON", "RETICCTPCT" in the last 72 hours. Most Recent Urinalysis On File:     Component Value Date/Time   COLORURINE YELLOW (A) 07/30/2022 1230   APPEARANCEUR HAZY (A) 07/30/2022 1230   LABSPEC 1.021 07/30/2022 1230   PHURINE 5.0 07/30/2022 1230   GLUCOSEU NEGATIVE 07/30/2022 1230   HGBUR SMALL (A) 07/30/2022 1230   BILIRUBINUR NEGATIVE 07/30/2022 1230   KETONESUR 5 (A) 07/30/2022 1230   PROTEINUR 100 (A) 07/30/2022 1230   NITRITE NEGATIVE 07/30/2022 1230   LEUKOCYTESUR NEGATIVE 07/30/2022 1230   Sepsis Labs: @LABRCNTIP (procalcitonin:4,lacticidven:4)  Recent Results (from the past 240 hour(s))  SARS Coronavirus 2 by RT PCR (hospital order, performed in [facility] hospital lab) *cepheid single result test* Anterior Nasal Swab     Status: None   Collection Time: 09/13/22 12:24 PM   Specimen: Anterior Nasal Swab  Result Value Ref Range Status   SARS Coronavirus 2 by RT PCR NEGATIVE NEGATIVE Final    Comment: (NOTE) SARS-CoV-2 target nucleic acids are NOT DETECTED.  The SARS-CoV-2 RNA is generally detectable in upper and lower respiratory specimens during the acute phase of infection. The lowest concentration of SARS-CoV-2 viral copies this assay can detect is 250 copies / mL. A negative result does not preclude SARS-CoV-2 infection and should not be used as the sole basis  for treatment or other patient management decisions.  A negative result may occur with improper specimen collection / handling, submission of specimen other than nasopharyngeal swab, presence of viral mutation(s) within the areas targeted by this assay, and inadequate number of viral copies (<250 copies / mL). A negative result must be combined with clinical observations, patient history, and epidemiological information.  Fact Sheet for Patients:   https://www.patel.info/  Fact Sheet for Healthcare Providers: https://hall.com/  This test is not yet approved or  cleared by the [place] FDA and has been authorized for detection and/or diagnosis of SARS-CoV-2 by FDA under an Emergency Use Authorization (EUA).  This EUA will remain in effect (meaning this test can be used) for the duration of the COVID-19 declaration under Section 564(b)(1) of the Act, 21 U.S.C. section 360bbb-3(b)(1), unless the authorization is terminated or revoked sooner.  Performed at [facility], [address], [place], [place] [address]          Radiology Studies: DG Chest Portable 1 View  Result Date: 09/13/2022 CLINICAL DATA:  Dyspnea, COPD, recent diagnosis of pneumonia EXAM: PORTABLE CHEST 1 VIEW COMPARISON:  07/30/2022 chest radiograph. FINDINGS: Surgical hardware from ACD[facility] overlies the lower cervical spine. Stable cardiomediastinal silhouette with top-normal heart size. No pneumothorax. Small left pleural effusion, similar, possibly loculated. No significant right pleural effusion. Patchy hazy opacity in the peripheral left mid lung, decreased. No overt pulmonary edema. IMPRESSION: Patchy hazy opacity in the peripheral left mid lung, decreased, favor improving pneumonia superimposed on chronic lingular scarring as seen on 07/04/2022 chest CT angiogram study. Similar small left pleural effusion, possibly loculated. Continued chest radiograph  follow-up advised in 2-3 months or earlier as clinically warranted. Electronically Signed   By: Ilona Sorrel M.D.   On: 09/13/2022 12:41            LOS: 0 days    Time spent: 50 minutes     Emeterio Reeve, DO [facility] 09/14/2022, 1:45 PM    Dictation software may have been used to generate the above note. Typos may occur and escape review in typed/dictated notes. Please contact Dr Sheppard Coil directly for clarity if needed.  Staff may message me via secure chat in [place]  but this may not receive an immediate response,  please page me for urgent matters!  If 7PM-7AM, please contact night coverage [email or web address]

## 2022-09-14 NOTE — Progress Notes (Signed)
RT TO ER AT 1725 TO TRANSPORT PT TO CT FROM ER #8 THEN TO ROOM 251 WHILE ON BIPAP.

## 2022-09-14 NOTE — Progress Notes (Signed)
       CROSS COVER NOTE  NAME: Suzanne Glenn MRN: [id number] DOB : 1951-09-04    Time of Service   0430 am  HPI/Events of Note   Nurse reports increased wheezing with duonebs only ordered TID and increased work of breathing.   Assessment and  Interventions   Assessment: Neuro A & O x4 MAE Pulm she appears to be using accessory muscles with breathing but she states she feels her breathing is fine. She does not feel distressed. Sats on 10 LHFNC have been above 92  Review of chart shows patient to have had similar episodes in past and to be chronic CO2 retainer VBG  7.17 [Ref Range: 7.25 - 7.43]Comment: RBV BAILEY OSORIO RN AT 0412 ON [id number] pCO2, Ven >123 mmHg [Ref Range: 44 - 60] pO2, Ven 49 mmHg [Ref Range: 32 - 45] Bicarbonate 53.6 mmol/L [Ref Range: 20.0 - 28.0] Acid-Base Excess 18.3 mmol/L [Ref Range: 0.0 - 2.  Plan: Sat goal 88-92 Bipap Will need pulm consult, likely will need cpap at home      Kathlene Cote NP [facility]

## 2022-09-15 ENCOUNTER — Encounter: Payer: Self-pay | Admitting: Osteopathic Medicine

## 2022-09-15 DIAGNOSIS — J441 Chronic obstructive pulmonary disease with (acute) exacerbation: Secondary | ICD-10-CM | POA: Diagnosis not present

## 2022-09-15 LAB — BASIC METABOLIC PANEL
Anion gap: 10 (ref 5–15)
BUN: 27 mg/dL — ABNORMAL HIGH (ref 8–23)
CO2: 42 mmol/L — ABNORMAL HIGH (ref 22–32)
Calcium: 9.2 mg/dL (ref 8.9–10.3)
Chloride: 89 mmol/L — ABNORMAL LOW (ref 98–111)
Creatinine, Ser: 0.57 mg/dL (ref 0.44–1.00)
GFR, Estimated: >60 mL/min (ref >60)
Glucose, Bld: 147 mg/dL — ABNORMAL HIGH (ref 70–99)
Potassium: 4.4 mmol/L (ref 3.5–5.1)
Sodium: 141 mmol/L (ref 135–145)

## 2022-09-15 LAB — CBC
HCT: 28.9 % — ABNORMAL LOW (ref 36.0–46.0)
Hemoglobin: 8.5 g/dL — ABNORMAL LOW (ref 12.0–15.0)
MCH: 27.9 pg (ref 26.0–34.0)
MCHC: 29.4 g/dL — ABNORMAL LOW (ref 30.0–36.0)
MCV: 94.8 fL (ref 80.0–100.0)
Platelets: 317 10*3/uL (ref 150–400)
RBC: 3.05 MIL/uL — ABNORMAL LOW (ref 3.87–5.11)
RDW: 14.9 % (ref 11.5–15.5)
WBC: 10.3 10*3/uL (ref 4.0–10.5)
nRBC: 0.4 % — ABNORMAL HIGH (ref 0.0–0.2)

## 2022-09-15 LAB — GLUCOSE, CAPILLARY
Glucose-Capillary: 157 mg/dL — ABNORMAL HIGH (ref 70–99)
Glucose-Capillary: 365 mg/dL — ABNORMAL HIGH (ref 70–99)
Glucose-Capillary: 275 mg/dL — ABNORMAL HIGH (ref 70–99)
Glucose-Capillary: 118 mg/dL — ABNORMAL HIGH (ref 70–99)

## 2022-09-15 MED ORDER — ORAL CARE MOUTH RINSE: 15.0000 mL | OROMUCOSAL | Status: DC | PRN

## 2022-09-15 MED ORDER — PREDNISONE 50 MG PO TABS
50.0000 mg | ORAL_TABLET | Freq: Every day | ORAL | Status: AC
  Administered 2022-09-15 – 2022-09-18 (×4): 50 mg via ORAL
  Filled 2022-09-15 (×4): qty 1

## 2022-09-15 MED ORDER — PNEUMOCOCCAL 20-VAL CONJ VACC 0.5 ML IM SUSY
0.5000 mL | PREFILLED_SYRINGE | INTRAMUSCULAR | Status: DC
  Filled 2022-09-15: qty 0.5

## 2022-09-15 MED ORDER — ALPRAZOLAM 0.5 MG PO TABS
0.5000 mg | ORAL_TABLET | Freq: Two times a day (BID) | ORAL | Status: DC | PRN
  Administered 2022-09-15: 0.5 mg via ORAL
  Filled 2022-09-15: qty 1

## 2022-09-15 MED ORDER — INFLUENZA VAC A&B SA ADJ QUAD 0.5 ML IM PRSY: 0.5000 mL | PREFILLED_SYRINGE | INTRAMUSCULAR | Status: DC

## 2022-09-15 NOTE — Progress Notes (Signed)
PROGRESS NOTE    Suzanne Glenn   AST:[id number] DOB: [birth year]  DOA: 09/13/2022 Date of Service: 09/15/22 PCP: Tracie Harrier, MD     Brief Narrative / Hospital Course:  Suzanne Glenn is a 71 year old female with history of COPD current tobacco use, oxygen supplementation at 4 L nasal cannula, GERD, hyperlipidemia, hypertension, who presents to emergency department for chief concerns of shortness of breath. 11/30: afebrile, tachypneic to 29, HR and BP WNL. Mild hyperglycemia, no other concerns on CMP. BNP 770. Procalcitonin <0.10. WBC 12.3, Hgb 9.1. Neg COVID. Per ED documentation, EMS gave patient 1 breathing treatment and gave patient 1 dose of Solu-Medrol 125 mg IV one-time dose. CXR: "Patchy hazy opacity in the peripheral left mid lung, decreased, favor improving pneumonia superimposed on chronic lingular scarring as seen on 07/04/2022 chest CT angiogram study. Similar small left pleural effusion, possibly loculated. Continued chest radiograph follow-up advised in 2-3 months or earlier as clinically warranted." ED treatment: DuoNebs x3, ceftriaxone 2 g one-time dose, azithromycin 500 mg IV one-time dose. Admitted to hpspitalist service.  12/01: overnight/early AM, requiring more O2 and needing BiPap. She is alert on rounds, she is not comfortable w/ the BiPap mask but husband is encouraging her to continue to wear it. Plan CT chest given deterioration and concern for possible loculated effusion on XR. Restarted abx pending CT. Reexamined in ED later in afternoon - pt reports feeling a bit better. Tachycardic and hypertensive - IV meds while npo on bipap, still tachypneic but SpO2 okay. Lung sounds same. CT chest no PE, (+)new right upper lobe airspace consolidation worrisome for pneumonia.  New patchy and ill-defined nodular densities in the bilateral upper lobes favored as infectious/inflammatory. 12/02: Off BiPap today, still on 6L [place]. Continue current treatment.      Consultants:  none  Procedures: none      ASSESSMENT & PLAN:   Principal Problem:   COPD exacerbation ([place]) Active Problems:   Essential hypertension   Type 2 diabetes mellitus with hyperlipidemia (HCC)   Tobacco use   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HCC)   MDD (major depressive disorder), recurrent, in full remission ([place])   HLD (hyperlipidemia)   RLS (restless legs syndrome)  COPD exacerbation ([place]) Presumed secondary to continued tobacco use, complicated by recent exposure to a young child w/ respiratory illness Off BiPap today, still on 6L [place] DuoNebs Solu-Medrol 40 mg IV twice daily, 2 doses ordered for 09/14/2022 and will transition to po after that  Procalcitonin was negative, but given worsening and CT concerning for PNA will continue abx w/ azithro/ceftriax  HLD (hyperlipidemia) Pravastatin 80 mg q. evening resumed   MDD (major depressive disorder), recurrent, in full remission (HCC) Venlafaxine 225 mg daily resumed   Tobacco use Nicotine as needed Patch ordered   Type 2 diabetes mellitus with hyperlipidemia (HCC) Home metformin not resumed on admission Insulin SSI with at bedtime coverage ordered Goal inpatient blood glucose level is 140-180   Essential hypertension Amlodipine 10 mg daily, metoprolol tartrate 25 mg p.o. twice daily, spironolactone 25 mg daily, isosorbide mononitrate 30 mg daily resumed       DVT prophylaxis: Lovenox  Pertinent IV fluids/nutrition: no continuous IV fluids Central lines / invasive devices: none   Code Status: FULL CODE   Disposition: obs changed to inpatient, progressive given BiPap need TOC needs: none at this time  Barriers to discharge / significant pending items: O2 requirement w/ BiPap support  Subjective:  Patient reports SOB improved this morning, happy to be off BiPap Denies CP.  Pain controlled.  Denies new weakness.  Tolerating diet.  Reports no concerns w/  urination/defecation.   Family Communication: none at this time     Objective Findings:  Vitals:   09/14/22 2352 09/15/22 0500 09/15/22 0752 09/15/22 1153  BP: 108/83 (!) 156/75 (!) 148/77 (!) 140/75  Pulse: (!) 53 (!) 107 (!) 106 63  Resp: 18 (!) 23 19 19   Temp: (!) 97.5 F (36.4 C) 97.8 F (36.6 C) 97.8 F (36.6 C) 98.3 F (36.8 C)  TempSrc: Oral  Axillary Axillary  SpO2: 92% 95% 99% 98%  Weight:      Height:        Intake/Output Summary (Last 24 hours) at 09/15/2022 1238 Last data filed at 09/15/2022 1154 Gross per 24 hour  Intake 590 ml  Output 725 ml  Net -135 ml   Filed Weights   09/13/22 1121  Weight: 83.9 kg    Examination:  Constitutional:  VS as above General Appearance: alert, well-developed, well-nourished, NAD Neck: No masses, trachea midline Respiratory: Fair respiratory effort + wheeze scattered + rhonchi - slight improved from yesterday No rales Cardiovascular: S1/S2 normal No murmur No lower extremity edema Gastrointestinal: No tenderness Musculoskeletal:  No clubbing/cyanosis of digits Symmetrical movement in all extremities Neurological: No cranial nerve deficit on limited exam Alert Psychiatric: Normal judgment/insight Normal mood and affect       Scheduled Medications:   amLODipine  10 mg Oral Daily   aspirin  81 mg Oral Daily   carbidopa-levodopa  1 tablet Oral TID   enoxaparin (LOVENOX) injection  0.5 mg/kg Subcutaneous Q24H   feeding supplement  237 mL Oral BID BM   fluticasone furoate-vilanterol  1 puff Inhalation Daily   And   umeclidinium bromide  1 puff Inhalation Daily   gabapentin  300 mg Oral TID   [START ON 09/16/2022] influenza vaccine adjuvanted  0.5 mL Intramuscular Tomorrow-1000   insulin aspart  0-15 Units Subcutaneous TID WC   insulin aspart  0-5 Units Subcutaneous QHS   ipratropium-albuterol  3 mL Nebulization Q4H   isosorbide mononitrate  30 mg Oral Daily   methylPREDNISolone (SOLU-MEDROL)  injection  125 mg Intravenous Once   metoprolol tartrate  5 mg Intravenous Q8H   pantoprazole  80 mg Oral Q1200   [START ON 09/16/2022] pneumococcal 20-valent conjugate vaccine  0.5 mL Intramuscular Tomorrow-1000   pravastatin  80 mg Oral QPM   predniSONE  50 mg Oral Q breakfast   spironolactone  25 mg Oral Daily   venlafaxine XR  150 mg Oral Daily   venlafaxine XR  75 mg Oral Daily    Continuous Infusions:  azithromycin (ZITHROMAX) 500 mg in sodium chloride 0.9 % 250 mL IVPB Stopped (09/14/22 1701)   cefTRIAXone (ROCEPHIN)  IV Stopped (09/14/22 1500)    PRN Medications:  acetaminophen **OR** acetaminophen, hydrALAZINE, ipratropium-albuterol, nicotine, ondansetron **OR** ondansetron (ZOFRAN) IV, mouth rinse, senna-docusate  Antimicrobials:  Anti-infectives (From admission, onward)    Start     Dose/Rate Route Frequency Ordered Stop   09/14/22 1500  azithromycin (ZITHROMAX) 500 mg in sodium chloride 0.9 % 250 mL IVPB        500 mg 250 mL/hr over 60 Minutes Intravenous Every 24 hours 09/14/22 1341 09/18/22 1459   09/14/22 1400  cefTRIAXone (ROCEPHIN) 2 g in sodium chloride 0.9 % 100 mL IVPB        2 g 200 mL/hr over  30 Minutes Intravenous Every 24 hours 09/14/22 1341 09/18/22 1359   09/13/22 1415  vancomycin (VANCOREADY) IVPB 1750 mg/350 mL  Status:  Discontinued        1,750 mg 175 mL/hr over 120 Minutes Intravenous  Once 09/13/22 1343 09/13/22 1344   09/13/22 1345  ceFEPIme (MAXIPIME) 2 g in sodium chloride 0.9 % 100 mL IVPB  Status:  Discontinued        2 g 200 mL/hr over 30 Minutes Intravenous  Once 09/13/22 1343 09/13/22 1344   09/13/22 1345  cefTRIAXone (ROCEPHIN) 2 g in sodium chloride 0.9 % 100 mL IVPB        2 g 200 mL/hr over 30 Minutes Intravenous  Once 09/13/22 1344 09/13/22 1505   09/13/22 1345  azithromycin (ZITHROMAX) 500 mg in sodium chloride 0.9 % 250 mL IVPB        500 mg 250 mL/hr over 60 Minutes Intravenous  Once 09/13/22 1344 09/13/22 1725            Data Reviewed: I have personally reviewed following labs and imaging studies  CBC: Recent Labs  Lab 09/13/22 1126 09/14/22 0645 09/15/22 0526  WBC 12.3* 12.5* 10.3  NEUTROABS 10.7*  --   --   HGB 9.1* 8.3* 8.5*  HCT 31.6* 28.5* 28.9*  MCV 96.9 95.0 94.8  PLT 332 349 010   Basic Metabolic Panel: Recent Labs  Lab 09/13/22 1126 09/14/22 0645 09/15/22 0526  NA 140 140 141  K 4.3 4.9 4.4  CL 88* 88* 89*  CO2 44* 43* 42*  GLUCOSE 165* 146* 147*  BUN 16 21 27*  CREATININE 0.52 0.54 0.57  CALCIUM 9.3 9.3 9.2   GFR: Estimated Creatinine Clearance: 67.1 mL/min (by C-G formula based on SCr of 0.57 mg/dL). Liver Function Tests: Recent Labs  Lab 09/13/22 1126  AST 14*  ALT <5  ALKPHOS 67  BILITOT 0.5  PROT 6.7  ALBUMIN 3.5   No results for input(s): "LIPASE", "AMYLASE" in the last 168 hours. No results for input(s): "AMMONIA" in the last 168 hours. Coagulation Profile: No results for input(s): "INR", "PROTIME" in the last 168 hours. Cardiac Enzymes: No results for input(s): "CKTOTAL", "CKMB", "CKMBINDEX", "TROPONINI" in the last 168 hours. BNP (last 3 results) No results for input(s): "PROBNP" in the last 8760 hours. HbA1C: No results for input(s): "HGBA1C" in the last 72 hours. CBG: Recent Labs  Lab 09/14/22 1206 09/14/22 1852 09/14/22 2111 09/15/22 0749 09/15/22 1150  GLUCAP 99 156* 177* 157* 365*   Lipid Profile: No results for input(s): "CHOL", "HDL", "LDLCALC", "TRIG", "CHOLHDL", "LDLDIRECT" in the last 72 hours. Thyroid Function Tests: No results for input(s): "TSH", "T4TOTAL", "FREET4", "T3FREE", "THYROIDAB" in the last 72 hours. Anemia Panel: No results for input(s): "VITAMINB12", "FOLATE", "FERRITIN", "TIBC", "IRON", "RETICCTPCT" in the last 72 hours. Most Recent Urinalysis On File:     Component Value Date/Time   COLORURINE YELLOW (A) 07/30/2022 1230   APPEARANCEUR HAZY (A) 07/30/2022 1230   LABSPEC 1.021 07/30/2022 1230   PHURINE  5.0 07/30/2022 1230   GLUCOSEU NEGATIVE 07/30/2022 1230   HGBUR SMALL (A) 07/30/2022 1230   BILIRUBINUR NEGATIVE 07/30/2022 1230   KETONESUR 5 (A) 07/30/2022 1230   PROTEINUR 100 (A) 07/30/2022 1230   NITRITE NEGATIVE 07/30/2022 1230   LEUKOCYTESUR NEGATIVE 07/30/2022 1230   Sepsis Labs: @LABRCNTIP (procalcitonin:4,lacticidven:4)  Recent Results (from the past 240 hour(s))  SARS Coronavirus 2 by RT PCR (hospital order, performed in [facility] hospital lab) *cepheid single result test* Anterior Nasal  Swab     Status: None   Collection Time: 09/13/22 12:24 PM   Specimen: Anterior Nasal Swab  Result Value Ref Range Status   SARS Coronavirus 2 by RT PCR NEGATIVE NEGATIVE Final    Comment: (NOTE) SARS-CoV-2 target nucleic acids are NOT DETECTED.  The SARS-CoV-2 RNA is generally detectable in upper and lower respiratory specimens during the acute phase of infection. The lowest concentration of SARS-CoV-2 viral copies this assay can detect is 250 copies / mL. A negative result does not preclude SARS-CoV-2 infection and should not be used as the sole basis for treatment or other patient management decisions.  A negative result may occur with improper specimen collection / handling, submission of specimen other than nasopharyngeal swab, presence of viral mutation(s) within the areas targeted by this assay, and inadequate number of viral copies (<250 copies / mL). A negative result must be combined with clinical observations, patient history, and epidemiological information.  Fact Sheet for Patients:   https://www.patel.info/  Fact Sheet for Healthcare Providers: https://hall.com/  This test is not yet approved or  cleared by the [place] FDA and has been authorized for detection and/or diagnosis of SARS-CoV-2 by FDA under an Emergency Use Authorization (EUA).  This EUA will remain in effect (meaning this test can be used) for the  duration of the COVID-19 declaration under Section 564(b)(1) of the Act, 21 U.S.C. section 360bbb-3(b)(1), unless the authorization is terminated or revoked sooner.  Performed at [facility], [address] [place], [place] [address]   Blood culture (single)     Status: None (Preliminary result)   Collection Time: 09/14/22  6:45 AM   Specimen: BLOOD  Result Value Ref Range Status   Specimen Description BLOOD RIGHT WRIST  Final   Special Requests   Final    BOTTLES DRAWN AEROBIC AND ANAEROBIC Blood Culture results may not be optimal due to an excessive volume of blood received in culture bottles   Culture   Final    NO GROWTH 1 DAY Performed at [facility], [address], [place], [place] [address]    Report Status PENDING  Incomplete         Radiology Studies: DG Chest Portable 1 View  Result Date: 09/13/2022 CLINICAL DATA:  Dyspnea, COPD, recent diagnosis of pneumonia EXAM: PORTABLE CHEST 1 VIEW COMPARISON:  07/30/2022 chest radiograph. FINDINGS: Surgical hardware from ACDF overlies the lower cervical spine. Stable cardiomediastinal silhouette with top-normal heart size. No pneumothorax. Small left pleural effusion, similar, possibly loculated. No significant right pleural effusion. Patchy hazy opacity in the peripheral left mid lung, decreased. No overt pulmonary edema. IMPRESSION: Patchy hazy opacity in the peripheral left mid lung, decreased, favor improving pneumonia superimposed on chronic lingular scarring as seen on 07/04/2022 chest CT angiogram study. Similar small left pleural effusion, possibly loculated. Continued chest radiograph follow-up advised in 2-3 months or earlier as clinically warranted. Electronically Signed   By: Ilona Sorrel M.D.   On: 09/13/2022 12:41   CT Angio Chest Pulmonary Embolism (PE) W or WO Contrast  Result Date: 09/14/2022 CLINICAL DATA:  High probability for PE. EXAM: CT ANGIOGRAPHY CHEST WITH CONTRAST TECHNIQUE:  Multidetector CT imaging of the chest was performed using the standard protocol during bolus administration of intravenous contrast. Multiplanar CT image reconstructions and MIPs were obtained to evaluate the vascular anatomy. RADIATION DOSE REDUCTION: This exam was performed according to the departmental dose-optimization program which includes automated exposure control, adjustment of the mA and/or kV according to  patient size and/or use of iterative reconstruction technique. CONTRAST:  27mL OMNIPAQUE IOHEXOL 350 MG/ML SOLN COMPARISON:  CT angiogram chest 07/04/2022 FINDINGS: Cardiovascular: Satisfactory opacification of the pulmonary arteries to the segmental level. No evidence of pulmonary embolism. There are atherosclerotic calcifications of the aorta. Aorta is normal in size. Heart is normal in size. There is a small pericardial effusion which is stable from prior. Mediastinum/Nodes: No enlarged mediastinal, hilar, or axillary lymph nodes. Thyroid gland, trachea, and esophagus demonstrate no significant findings. Lungs/Pleura: Trace left and small right pleural effusions are new from prior. There is some atelectasis in the right lower lobe. There is new airspace consolidation in the posterior right upper lobe. Chronic appearing atelectasis loculated fluid in the region of the lingula appears stable from prior. There is some new patchy and ill-defined nodular densities in the bilateral upper lobes with nodular densities measuring up to 8 mm. Mild emphysematous changes persist. No evidence for pneumothorax. Trachea and central airways are patent. Upper Abdomen: Cholecystectomy clips are present. Musculoskeletal: Cervical spinal fusion plate partially visualized. Review of the MIP images confirms the above findings. IMPRESSION: 1. No evidence for pulmonary embolism. 2. New right upper lobe airspace consolidation worrisome for pneumonia. 3. New patchy and ill-defined nodular densities in the bilateral upper lobes  favored as infectious/inflammatory. Nodular densities measure up to 8 mm. Follow-up chest CT recommended in 3-6 months to re-evaluate. 4. New small right and trace left pleural effusions. 5. Stable small pericardial effusion. 6. Stable atelectasis and loculated pleural fluid in the region of the lingula. Aortic Atherosclerosis (ICD10-I70.0). Electronically Signed   By: Ronney Asters M.D.   On: 09/14/2022 18:09            LOS: 1 day    Time spent: 50 minutes     Emeterio Reeve, DO [facility] 09/15/2022, 12:38 PM    Dictation software may have been used to generate the above note. Typos may occur and escape review in typed/dictated notes. Please contact Dr Sheppard Coil directly for clarity if needed.  Staff may message me via secure chat in [place]  but this may not receive an immediate response,  please page me for urgent matters!  If 7PM-7AM, please contact night coverage [email or web address]

## 2022-09-16 DIAGNOSIS — J441 Chronic obstructive pulmonary disease with (acute) exacerbation: Secondary | ICD-10-CM | POA: Diagnosis not present

## 2022-09-16 DIAGNOSIS — E785 Hyperlipidemia, unspecified: Secondary | ICD-10-CM | POA: Diagnosis not present

## 2022-09-16 DIAGNOSIS — I10 Essential (primary) hypertension: Secondary | ICD-10-CM | POA: Diagnosis not present

## 2022-09-16 DIAGNOSIS — F3342 Major depressive disorder, recurrent, in full remission: Secondary | ICD-10-CM | POA: Diagnosis not present

## 2022-09-16 LAB — GLUCOSE, CAPILLARY
Glucose-Capillary: 128 mg/dL — ABNORMAL HIGH (ref 70–99)
Glucose-Capillary: 325 mg/dL — ABNORMAL HIGH (ref 70–99)
Glucose-Capillary: 345 mg/dL — ABNORMAL HIGH (ref 70–99)
Glucose-Capillary: 119 mg/dL — ABNORMAL HIGH (ref 70–99)

## 2022-09-16 MED ORDER — ORAL CARE MOUTH RINSE: 15.0000 mL | OROMUCOSAL | Status: DC | PRN

## 2022-09-16 NOTE — Progress Notes (Addendum)
PROGRESS NOTE    Suzanne Glenn   BMW:[id number] DOB: Apr 01, 1951  DOA: 09/13/2022 Date of Service: 09/16/22 PCP: Tracie Harrier, MD     Brief Narrative / Hospital Course:  Suzanne Glenn is a 71 year old female with history of COPD current tobacco use, oxygen supplementation at 4 L nasal cannula, GERD, hyperlipidemia, hypertension, who presents to emergency department for chief concerns of shortness of breath. 11/30: afebrile, tachypneic to 29, HR and BP WNL. Mild hyperglycemia, no other concerns on CMP. BNP 770. Procalcitonin <0.10. WBC 12.3, Hgb 9.1. Neg COVID. Per ED documentation, EMS gave patient 1 breathing treatment and gave patient 1 dose of Solu-Medrol 125 mg IV one-time dose. CXR: "Patchy hazy opacity in the peripheral left mid lung, decreased, favor improving pneumonia superimposed on chronic lingular scarring as seen on 07/04/2022 chest CT angiogram study. Similar small left pleural effusion, possibly loculated. Continued chest radiograph follow-up advised in 2-3 months or earlier as clinically warranted." ED treatment: DuoNebs x3, ceftriaxone 2 g one-time dose, azithromycin 500 mg IV one-time dose. Admitted to hpspitalist service.  12/01: overnight/early AM, requiring more O2 and needing BiPap. She is alert on rounds, she is not comfortable w/ the BiPap mask but husband is encouraging her to continue to wear it. Plan CT chest given deterioration and concern for possible loculated effusion on XR. Restarted abx pending CT. Reexamined in ED later in afternoon - pt reports feeling a bit better. Tachycardic and hypertensive - IV meds while npo on bipap, still tachypneic but SpO2 okay. Lung sounds same. CT chest no PE, (+)new right upper lobe airspace consolidation worrisome for pneumonia.  New patchy and ill-defined nodular densities in the bilateral upper lobes favored as infectious/inflammatory. 12/02: Off BiPap today, still on 6L [place]. Continue current treatment.  12/03: still  needing BiPap overnight. On 6L [facility]NC this morning and into the afternoon.     Consultants:  none  Procedures: none      ASSESSMENT & PLAN:   Principal Problem:   COPD exacerbation ([place]) Active Problems:   Essential hypertension   Type 2 diabetes mellitus with hyperlipidemia (HCC)   Tobacco use   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HCC)   MDD (major depressive disorder), recurrent, in full remission ([place])   HLD (hyperlipidemia)   RLS (restless legs syndrome)  COPD exacerbation ([place]) Presumed secondary to continued tobacco use, complicated by recent exposure to a young child w/ respiratory illness Off BiPap during the day, still needing 6L [place] while awake  DuoNebs Solu-Medrol 40 mg IV twice daily, 2 doses ordered for 09/14/2022 and will transition to po after that  Procalcitonin was negative, but given worsening and CT concerning for PNA will continue abx w/ azithro/ceftriax slow to improve, may need BiPap at home - plan pulmonary consult   HLD (hyperlipidemia) Pravastatin 80 mg q. evening resumed   MDD (major depressive disorder), recurrent, in full remission (HCC) Venlafaxine 225 mg daily resumed   Tobacco use Nicotine as needed Patch ordered   Type 2 diabetes mellitus with hyperlipidemia ([place]) Home metformin not resumed on admission Insulin SSI with at bedtime coverage ordered Goal inpatient blood glucose level is 140-180   Essential hypertension Amlodipine 10 mg daily, metoprolol tartrate 25 mg p.o. twice daily, spironolactone 25 mg daily, isosorbide mononitrate 30 mg daily resumed       DVT prophylaxis: Lovenox  Pertinent IV fluids/nutrition: no continuous IV fluids Central lines / invasive devices: none   Code Status: FULL CODE   Disposition: inpatient  TOC  needs: none at this time, may need STR/HH and new DME  Barriers to discharge / significant pending items: O2 requirement w/ BiPap support              Subjective:  Patient reports  breathing about the same as yesterday, difficult w/ minimal exertion  Denies CP.  Pain controlled.  Denies new weakness.  Tolerating diet.  Reports no concerns w/ urination/defecation.   Family Communication: called husband to update 09/16/22 4:32 PM all questions answered      Objective Findings:  Vitals:   09/16/22 0305 09/16/22 0438 09/16/22 0819 09/16/22 1207  BP:  (!) 147/90 (!) 156/70 (!) 146/65  Pulse:  70 (!) 54 (!) 121  Resp:  19 17 (!) 21  Temp:  98.3 F (36.8 C) 97.9 F (36.6 C) 98.1 F (36.7 C)  TempSrc:      SpO2: 92% 99% 99% 94%  Weight:      Height:        Intake/Output Summary (Last 24 hours) at 09/16/2022 1409 Last data filed at 09/15/2022 1616 Gross per 24 hour  Intake 340 ml  Output 300 ml  Net 40 ml   Filed Weights   09/13/22 1121  Weight: 83.9 kg    Examination:  Constitutional:  VS as above General Appearance: alert, well-developed, well-nourished, NAD Neck: No masses, trachea midline Respiratory: Fair respiratory effort + wheeze scattered + rhonchi - slight improved from yesterday No rales Cardiovascular: S1/S2 normal No murmur No lower extremity edema Gastrointestinal: No tenderness Musculoskeletal:  No clubbing/cyanosis of digits Symmetrical movement in all extremities Neurological: No cranial nerve deficit on limited exam Alert Psychiatric: Normal judgment/insight Normal mood and affect       Scheduled Medications:   amLODipine  10 mg Oral Daily   aspirin  81 mg Oral Daily   carbidopa-levodopa  1 tablet Oral TID   enoxaparin (LOVENOX) injection  0.5 mg/kg Subcutaneous Q24H   feeding supplement  237 mL Oral BID BM   fluticasone furoate-vilanterol  1 puff Inhalation Daily   And   umeclidinium bromide  1 puff Inhalation Daily   gabapentin  300 mg Oral TID   influenza vaccine adjuvanted  0.5 mL Intramuscular Tomorrow-1000   insulin aspart  0-15 Units Subcutaneous TID WC   insulin aspart  0-5 Units Subcutaneous QHS    ipratropium-albuterol  3 mL Nebulization Q4H   isosorbide mononitrate  30 mg Oral Daily   methylPREDNISolone (SOLU-MEDROL) injection  125 mg Intravenous Once   metoprolol tartrate  5 mg Intravenous Q8H   pantoprazole  80 mg Oral Q1200   pneumococcal 20-valent conjugate vaccine  0.5 mL Intramuscular Tomorrow-1000   pravastatin  80 mg Oral QPM   predniSONE  50 mg Oral Q breakfast   spironolactone  25 mg Oral Daily   venlafaxine XR  150 mg Oral Daily   venlafaxine XR  75 mg Oral Daily    Continuous Infusions:  azithromycin (ZITHROMAX) 500 mg in sodium chloride 0.9 % 250 mL IVPB 500 mg (09/16/22 1401)   cefTRIAXone (ROCEPHIN)  IV 2 g (09/16/22 1239)    PRN Medications:  ALPRAZolam, hydrALAZINE, ipratropium-albuterol, nicotine, ondansetron **OR** ondansetron (ZOFRAN) IV, mouth rinse, senna-docusate  Antimicrobials:  Anti-infectives (From admission, onward)    Start     Dose/Rate Route Frequency Ordered Stop   09/14/22 1500  azithromycin (ZITHROMAX) 500 mg in sodium chloride 0.9 % 250 mL IVPB        500 mg 250 mL/hr over 60 Minutes Intravenous Every  24 hours 09/14/22 1341 09/18/22 1459   09/14/22 1400  cefTRIAXone (ROCEPHIN) 2 g in sodium chloride 0.9 % 100 mL IVPB        2 g 200 mL/hr over 30 Minutes Intravenous Every 24 hours 09/14/22 1341 09/18/22 1359   09/13/22 1415  vancomycin (VANCOREADY) IVPB 1750 mg/350 mL  Status:  Discontinued        1,750 mg 175 mL/hr over 120 Minutes Intravenous  Once 09/13/22 1343 09/13/22 1344   09/13/22 1345  ceFEPIme (MAXIPIME) 2 g in sodium chloride 0.9 % 100 mL IVPB  Status:  Discontinued        2 g 200 mL/hr over 30 Minutes Intravenous  Once 09/13/22 1343 09/13/22 1344   09/13/22 1345  cefTRIAXone (ROCEPHIN) 2 g in sodium chloride 0.9 % 100 mL IVPB        2 g 200 mL/hr over 30 Minutes Intravenous  Once 09/13/22 1344 09/13/22 1505   09/13/22 1345  azithromycin (ZITHROMAX) 500 mg in sodium chloride 0.9 % 250 mL IVPB        500 mg 250 mL/hr  over 60 Minutes Intravenous  Once 09/13/22 1344 09/13/22 1725           Data Reviewed: I have personally reviewed following labs and imaging studies  CBC: Recent Labs  Lab 09/13/22 1126 09/14/22 0645 09/15/22 0526  WBC 12.3* 12.5* 10.3  NEUTROABS 10.7*  --   --   HGB 9.1* 8.3* 8.5*  HCT 31.6* 28.5* 28.9*  MCV 96.9 95.0 94.8  PLT 332 349 161   Basic Metabolic Panel: Recent Labs  Lab 09/13/22 1126 09/14/22 0645 09/15/22 0526  NA 140 140 141  K 4.3 4.9 4.4  CL 88* 88* 89*  CO2 44* 43* 42*  GLUCOSE 165* 146* 147*  BUN 16 21 27*  CREATININE 0.52 0.54 0.57  CALCIUM 9.3 9.3 9.2   GFR: Estimated Creatinine Clearance: 67.1 mL/min (by C-G formula based on SCr of 0.57 mg/dL). Liver Function Tests: Recent Labs  Lab 09/13/22 1126  AST 14*  ALT <5  ALKPHOS 67  BILITOT 0.5  PROT 6.7  ALBUMIN 3.5   No results for input(s): "LIPASE", "AMYLASE" in the last 168 hours. No results for input(s): "AMMONIA" in the last 168 hours. Coagulation Profile: No results for input(s): "INR", "PROTIME" in the last 168 hours. Cardiac Enzymes: No results for input(s): "CKTOTAL", "CKMB", "CKMBINDEX", "TROPONINI" in the last 168 hours. BNP (last 3 results) No results for input(s): "PROBNP" in the last 8760 hours. HbA1C: No results for input(s): "HGBA1C" in the last 72 hours. CBG: Recent Labs  Lab 09/15/22 1150 09/15/22 1615 09/15/22 2140 09/16/22 0818 09/16/22 1206  GLUCAP 365* 275* 118* 128* 325*   Lipid Profile: No results for input(s): "CHOL", "HDL", "LDLCALC", "TRIG", "CHOLHDL", "LDLDIRECT" in the last 72 hours. Thyroid Function Tests: No results for input(s): "TSH", "T4TOTAL", "FREET4", "T3FREE", "THYROIDAB" in the last 72 hours. Anemia Panel: No results for input(s): "VITAMINB12", "FOLATE", "FERRITIN", "TIBC", "IRON", "RETICCTPCT" in the last 72 hours. Most Recent Urinalysis On File:     Component Value Date/Time   COLORURINE YELLOW (A) 07/30/2022 1230   APPEARANCEUR  HAZY (A) 07/30/2022 1230   LABSPEC 1.021 07/30/2022 1230   PHURINE 5.0 07/30/2022 1230   GLUCOSEU NEGATIVE 07/30/2022 1230   HGBUR SMALL (A) 07/30/2022 1230   BILIRUBINUR NEGATIVE 07/30/2022 1230   KETONESUR 5 (A) 07/30/2022 1230   PROTEINUR 100 (A) 07/30/2022 1230   NITRITE NEGATIVE 07/30/2022 1230   LEUKOCYTESUR NEGATIVE 07/30/2022  1230   Sepsis Labs: @LABRCNTIP (procalcitonin:4,lacticidven:4)  Recent Results (from the past 240 hour(s))  SARS Coronavirus 2 by RT PCR (hospital order, performed in [facility] hospital lab) *cepheid single result test* Anterior Nasal Swab     Status: None   Collection Time: 09/13/22 12:24 PM   Specimen: Anterior Nasal Swab  Result Value Ref Range Status   SARS Coronavirus 2 by RT PCR NEGATIVE NEGATIVE Final    Comment: (NOTE) SARS-CoV-2 target nucleic acids are NOT DETECTED.  The SARS-CoV-2 RNA is generally detectable in upper and lower respiratory specimens during the acute phase of infection. The lowest concentration of SARS-CoV-2 viral copies this assay can detect is 250 copies / mL. A negative result does not preclude SARS-CoV-2 infection and should not be used as the sole basis for treatment or other patient management decisions.  A negative result may occur with improper specimen collection / handling, submission of specimen other than nasopharyngeal swab, presence of viral mutation(s) within the areas targeted by this assay, and inadequate number of viral copies (<250 copies / mL). A negative result must be combined with clinical observations, patient history, and epidemiological information.  Fact Sheet for Patients:   https://www.patel.info/  Fact Sheet for Healthcare Providers: https://hall.com/  This test is not yet approved or  cleared by the [place] FDA and has been authorized for detection and/or diagnosis of SARS-CoV-2 by FDA under an Emergency Use Authorization (EUA).  This EUA  will remain in effect (meaning this test can be used) for the duration of the COVID-19 declaration under Section 564(b)(1) of the Act, 21 U.S.C. section 360bbb-3(b)(1), unless the authorization is terminated or revoked sooner.  Performed at [facility], [address] [place], [place] [address]   Blood culture (single)     Status: None (Preliminary result)   Collection Time: 09/14/22  6:45 AM   Specimen: BLOOD  Result Value Ref Range Status   Specimen Description BLOOD RIGHT WRIST  Final   Special Requests   Final    BOTTLES DRAWN AEROBIC AND ANAEROBIC Blood Culture results may not be optimal due to an excessive volume of blood received in culture bottles   Culture   Final    NO GROWTH 2 DAYS Performed at [facility], [address] [place], [place] [address]    Report Status PENDING  Incomplete         Radiology Studies: DG Chest Portable 1 View  Result Date: 09/13/2022 CLINICAL DATA:  Dyspnea, COPD, recent diagnosis of pneumonia EXAM: PORTABLE CHEST 1 VIEW COMPARISON:  07/30/2022 chest radiograph. FINDINGS: Surgical hardware from [facility] overlies the lower cervical spine. Stable cardiomediastinal silhouette with top-normal heart size. No pneumothorax. Small left pleural effusion, similar, possibly loculated. No significant right pleural effusion. Patchy hazy opacity in the peripheral left mid lung, decreased. No overt pulmonary edema. IMPRESSION: Patchy hazy opacity in the peripheral left mid lung, decreased, favor improving pneumonia superimposed on chronic lingular scarring as seen on 07/04/2022 chest CT angiogram study. Similar small left pleural effusion, possibly loculated. Continued chest radiograph follow-up advised in 2-3 months or earlier as clinically warranted. Electronically Signed   By: Ilona Sorrel M.D.   On: 09/13/2022 12:41   No results found.          LOS: 2 days       Emeterio Reeve, DO [facility] 09/16/2022, 2:09 PM     Dictation software may have been used to generate the above note. Typos may occur and escape review in  typed/dictated notes. Please contact Dr Sheppard Coil directly for clarity if needed.  Staff may message me via secure chat in [place]  but this may not receive an immediate response,  please page me for urgent matters!  If 7PM-7AM, please contact night coverage [email or web address]

## 2022-09-17 DIAGNOSIS — J441 Chronic obstructive pulmonary disease with (acute) exacerbation: Secondary | ICD-10-CM | POA: Diagnosis not present

## 2022-09-17 DIAGNOSIS — F3342 Major depressive disorder, recurrent, in full remission: Secondary | ICD-10-CM | POA: Diagnosis not present

## 2022-09-17 DIAGNOSIS — I10 Essential (primary) hypertension: Secondary | ICD-10-CM | POA: Diagnosis not present

## 2022-09-17 DIAGNOSIS — E785 Hyperlipidemia, unspecified: Secondary | ICD-10-CM | POA: Diagnosis not present

## 2022-09-17 LAB — BASIC METABOLIC PANEL
BUN: 33 mg/dL — ABNORMAL HIGH (ref 8–23)
CO2: >45 mmol/L — ABNORMAL HIGH (ref 22–32)
Calcium: 9 mg/dL (ref 8.9–10.3)
Chloride: 91 mmol/L — ABNORMAL LOW (ref 98–111)
Creatinine, Ser: 0.53 mg/dL (ref 0.44–1.00)
GFR, Estimated: >60 mL/min (ref >60)
Glucose, Bld: 111 mg/dL — ABNORMAL HIGH (ref 70–99)
Potassium: 4.1 mmol/L (ref 3.5–5.1)
Sodium: 143 mmol/L (ref 135–145)

## 2022-09-17 LAB — CBC
HCT: 28.7 % — ABNORMAL LOW (ref 36.0–46.0)
Hemoglobin: 8 g/dL — ABNORMAL LOW (ref 12.0–15.0)
MCH: 27.6 pg (ref 26.0–34.0)
MCHC: 27.9 g/dL — ABNORMAL LOW (ref 30.0–36.0)
MCV: 99 fL (ref 80.0–100.0)
Platelets: 281 10*3/uL (ref 150–400)
RBC: 2.9 MIL/uL — ABNORMAL LOW (ref 3.87–5.11)
RDW: 15.3 % (ref 11.5–15.5)
WBC: 10.3 10*3/uL (ref 4.0–10.5)
nRBC: 0.4 % — ABNORMAL HIGH (ref 0.0–0.2)

## 2022-09-17 LAB — GLUCOSE, CAPILLARY
Glucose-Capillary: 108 mg/dL — ABNORMAL HIGH (ref 70–99)
Glucose-Capillary: 355 mg/dL — ABNORMAL HIGH (ref 70–99)
Glucose-Capillary: 226 mg/dL — ABNORMAL HIGH (ref 70–99)
Glucose-Capillary: 161 mg/dL — ABNORMAL HIGH (ref 70–99)

## 2022-09-17 MED ORDER — IPRATROPIUM-ALBUTEROL 0.5-2.5 (3) MG/3ML IN SOLN
3.0000 mL | Freq: Four times a day (QID) | RESPIRATORY_TRACT | Status: DC
  Administered 2022-09-18 – 2022-09-19 (×6): 3 mL via RESPIRATORY_TRACT
  Filled 2022-09-17 (×6): qty 3

## 2022-09-17 NOTE — Progress Notes (Signed)
PROGRESS NOTE    Suzanne Glenn   ZOX:[id number] DOB: 11-Aug-1951  DOA: 09/13/2022 Date of Service: 09/17/22 PCP: Tracie Harrier, MD     Brief Narrative / Hospital Course:  [name] is a 71 year old female with history of COPD current tobacco use, oxygen supplementation at 4 L nasal cannula, GERD, hyperlipidemia, hypertension, who presents to emergency department for chief concerns of shortness of breath. 11/30: afebrile, tachypneic to 29, HR and BP WNL. Mild hyperglycemia, no other concerns on CMP. BNP 770. Procalcitonin <0.10. WBC 12.3, Hgb 9.1. Neg COVID. Per ED documentation, EMS gave patient 1 breathing treatment and gave patient 1 dose of Solu-Medrol 125 mg IV one-time dose. CXR: "Patchy hazy opacity in the peripheral left mid lung, decreased, favor improving pneumonia superimposed on chronic lingular scarring as seen on 07/04/2022 chest CT angiogram study. Similar small left pleural effusion, possibly loculated. Continued chest radiograph follow-up advised in 2-3 months or earlier as clinically warranted." ED treatment: DuoNebs x3, ceftriaxone 2 g one-time dose, azithromycin 500 mg IV one-time dose. Admitted to hpspitalist service.  12/01: overnight/early AM, requiring more O2 and needing BiPap. She is alert on rounds, she is not comfortable w/ the BiPap mask but husband is encouraging her to continue to wear it. Plan CT chest given deterioration and concern for possible loculated effusion on XR. Restarted abx pending CT. Reexamined in ED later in afternoon - pt reports feeling a bit better. Tachycardic and hypertensive - IV meds while npo on bipap, still tachypneic but SpO2 okay. Lung sounds same. CT chest no PE, (+)new right upper lobe airspace consolidation worrisome for pneumonia.  New patchy and ill-defined nodular densities in the bilateral upper lobes favored as infectious/inflammatory. 12/02: Off BiPap today, still on 6L [name]. Continue current treatment.  12/03-12/04:  still needing BiPap overnight. On 6L HFNC this morning and into the afternoon. Will ask pulmonary team to evaluate.     Consultants:  none  Procedures: none      ASSESSMENT & PLAN:   Principal Problem:   COPD exacerbation ([place]) Active Problems:   Essential hypertension   Type 2 diabetes mellitus with hyperlipidemia (HCC)   Tobacco use   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HCC)   MDD (major depressive disorder), recurrent, in full remission ([place])   HLD (hyperlipidemia)   RLS (restless legs syndrome)  COPD exacerbation ([place]) Presumed secondary to continued tobacco use, complicated by recent exposure to a young child w/ respiratory illness Off BiPap during the day, still needing 6L [place] while awake  DuoNebs Solu-Medrol 40 mg IV twice daily, 2 doses ordered for 09/14/2022 and will transition to po after that  Procalcitonin was negative, but given worsening and CT concerning for PNA will continue abx w/ azithro/ceftriax slow to improve, may need BiPap at home - plan pulmonary consult   HLD (hyperlipidemia) Pravastatin 80 mg q. evening resumed   MDD (major depressive disorder), recurrent, in full remission (HCC) Venlafaxine 225 mg daily resumed   Tobacco use Nicotine as needed Patch ordered   Type 2 diabetes mellitus with hyperlipidemia ([place]) Home metformin not resumed on admission Insulin SSI with at bedtime coverage ordered Goal inpatient blood glucose level is 140-180   Essential hypertension Amlodipine 10 mg daily, metoprolol tartrate 25 mg p.o. twice daily, spironolactone 25 mg daily, isosorbide mononitrate 30 mg daily resumed       DVT prophylaxis: Lovenox  Pertinent IV fluids/nutrition: no continuous IV fluids Central lines / invasive devices: none   Code Status: FULL CODE  Disposition: inpatient  TOC needs: none at this time, may need STR/HH and new DME  Barriers to discharge / significant pending items: O2 requirement w/ BiPap support               Subjective:  Patient reports breathing about the same as yesterday, difficult w/ minimal exertion  Denies CP.  Pain controlled.  Denies new weakness.  Tolerating diet.  Reports no concerns w/ urination/defecation.   Family Communication: will call husband later today to update.     Objective Findings:  Vitals:   09/17/22 0732 09/17/22 0746 09/17/22 1133 09/17/22 1209  BP: 123/82   130/63  Pulse: 100   (!) 109  Resp: 20   (!) 22  Temp: 98 F (36.7 C)   98.5 F (36.9 C)  TempSrc: Oral     SpO2: 100% 100% 93% 96%  Weight:      Height:        Intake/Output Summary (Last 24 hours) at 09/17/2022 1315 Last data filed at 09/17/2022 0900 Gross per 24 hour  Intake 560.45 ml  Output 1100 ml  Net -539.55 ml   Filed Weights   09/13/22 1121 09/17/22 0431  Weight: 83.9 kg 84.5 kg    Examination:  Constitutional:  VS as above General Appearance: alert, well-developed, well-nourished, NAD Neck: No masses, trachea midline Respiratory: Fair respiratory effort + wheeze scattered + rhonchi - slight improved from yesterday No rales Cardiovascular: S1/S2 normal No murmur No lower extremity edema Gastrointestinal: No tenderness Musculoskeletal:  No clubbing/cyanosis of digits Symmetrical movement in all extremities Neurological: No cranial nerve deficit on limited exam Alert Psychiatric: Normal judgment/insight Normal mood and affect       Scheduled Medications:   amLODipine  10 mg Oral Daily   aspirin  81 mg Oral Daily   carbidopa-levodopa  1 tablet Oral TID   enoxaparin (LOVENOX) injection  0.5 mg/kg Subcutaneous Q24H   feeding supplement  237 mL Oral BID BM   fluticasone furoate-vilanterol  1 puff Inhalation Daily   And   umeclidinium bromide  1 puff Inhalation Daily   gabapentin  300 mg Oral TID   influenza vaccine adjuvanted  0.5 mL Intramuscular Tomorrow-1000   insulin aspart  0-15 Units Subcutaneous TID WC   insulin aspart   0-5 Units Subcutaneous QHS   ipratropium-albuterol  3 mL Nebulization Q4H   isosorbide mononitrate  30 mg Oral Daily   methylPREDNISolone (SOLU-MEDROL) injection  125 mg Intravenous Once   metoprolol tartrate  5 mg Intravenous Q8H   pantoprazole  80 mg Oral Q1200   pneumococcal 20-valent conjugate vaccine  0.5 mL Intramuscular Tomorrow-1000   pravastatin  80 mg Oral QPM   predniSONE  50 mg Oral Q breakfast   spironolactone  25 mg Oral Daily   venlafaxine XR  150 mg Oral Daily   venlafaxine XR  75 mg Oral Daily    Continuous Infusions:  azithromycin (ZITHROMAX) 500 mg in sodium chloride 0.9 % 250 mL IVPB 250 mL/hr at 09/16/22 1500   cefTRIAXone (ROCEPHIN)  IV Stopped (09/16/22 1313)    PRN Medications:  ALPRAZolam, hydrALAZINE, ipratropium-albuterol, nicotine, ondansetron **OR** ondansetron (ZOFRAN) IV, mouth rinse, senna-docusate  Antimicrobials:  Anti-infectives (From admission, onward)    Start     Dose/Rate Route Frequency Ordered Stop   09/14/22 1500  azithromycin (ZITHROMAX) 500 mg in sodium chloride 0.9 % 250 mL IVPB        500 mg 250 mL/hr over 60 Minutes Intravenous Every 24 hours 09/14/22  1341 09/18/22 1459   09/14/22 1400  cefTRIAXone (ROCEPHIN) 2 g in sodium chloride 0.9 % 100 mL IVPB        2 g 200 mL/hr over 30 Minutes Intravenous Every 24 hours 09/14/22 1341 09/18/22 1359   09/13/22 1415  vancomycin (VANCOREADY) IVPB 1750 mg/350 mL  Status:  Discontinued        1,750 mg 175 mL/hr over 120 Minutes Intravenous  Once 09/13/22 1343 09/13/22 1344   09/13/22 1345  ceFEPIme (MAXIPIME) 2 g in sodium chloride 0.9 % 100 mL IVPB  Status:  Discontinued        2 g 200 mL/hr over 30 Minutes Intravenous  Once 09/13/22 1343 09/13/22 1344   09/13/22 1345  cefTRIAXone (ROCEPHIN) 2 g in sodium chloride 0.9 % 100 mL IVPB        2 g 200 mL/hr over 30 Minutes Intravenous  Once 09/13/22 1344 09/13/22 1505   09/13/22 1345  azithromycin (ZITHROMAX) 500 mg in sodium chloride 0.9 % 250 mL  IVPB        500 mg 250 mL/hr over 60 Minutes Intravenous  Once 09/13/22 1344 09/13/22 1725           Data Reviewed: I have personally reviewed following labs and imaging studies  CBC: Recent Labs  Lab 09/13/22 1126 09/14/22 0645 09/15/22 0526 09/17/22 0502  WBC 12.3* 12.5* 10.3 10.3  NEUTROABS 10.7*  --   --   --   HGB 9.1* 8.3* 8.5* 8.0*  HCT 31.6* 28.5* 28.9* 28.7*  MCV 96.9 95.0 94.8 99.0  PLT 332 349 317 161   Basic Metabolic Panel: Recent Labs  Lab 09/13/22 1126 09/14/22 0645 09/15/22 0526 09/17/22 0502  NA 140 140 141 143  K 4.3 4.9 4.4 4.1  CL 88* 88* 89* 91*  CO2 44* 43* 42* >45*  GLUCOSE 165* 146* 147* 111*  BUN 16 21 27* 33*  CREATININE 0.52 0.54 0.57 0.53  CALCIUM 9.3 9.3 9.2 9.0   GFR: Estimated Creatinine Clearance: 67.4 mL/min (by C-G formula based on SCr of 0.53 mg/dL). Liver Function Tests: Recent Labs  Lab 09/13/22 1126  AST 14*  ALT <5  ALKPHOS 67  BILITOT 0.5  PROT 6.7  ALBUMIN 3.5   No results for input(s): "LIPASE", "AMYLASE" in the last 168 hours. No results for input(s): "AMMONIA" in the last 168 hours. Coagulation Profile: No results for input(s): "INR", "PROTIME" in the last 168 hours. Cardiac Enzymes: No results for input(s): "CKTOTAL", "CKMB", "CKMBINDEX", "TROPONINI" in the last 168 hours. BNP (last 3 results) No results for input(s): "PROBNP" in the last 8760 hours. HbA1C: No results for input(s): "HGBA1C" in the last 72 hours. CBG: Recent Labs  Lab 09/16/22 0818 09/16/22 1206 09/16/22 1649 09/16/22 2154 09/17/22 0728  GLUCAP 128* 325* 345* 119* 108*   Lipid Profile: No results for input(s): "CHOL", "HDL", "LDLCALC", "TRIG", "CHOLHDL", "LDLDIRECT" in the last 72 hours. Thyroid Function Tests: No results for input(s): "TSH", "T4TOTAL", "FREET4", "T3FREE", "THYROIDAB" in the last 72 hours. Anemia Panel: No results for input(s): "VITAMINB12", "FOLATE", "FERRITIN", "TIBC", "IRON", "RETICCTPCT" in the last 72  hours. Most Recent Urinalysis On File:     Component Value Date/Time   COLORURINE YELLOW (A) 07/30/2022 1230   APPEARANCEUR HAZY (A) 07/30/2022 1230   LABSPEC 1.021 07/30/2022 1230   PHURINE 5.0 07/30/2022 1230   GLUCOSEU NEGATIVE 07/30/2022 1230   HGBUR SMALL (A) 07/30/2022 1230   BILIRUBINUR NEGATIVE 07/30/2022 1230   KETONESUR 5 (A) 07/30/2022 1230  PROTEINUR 100 (A) 07/30/2022 1230   NITRITE NEGATIVE 07/30/2022 1230   LEUKOCYTESUR NEGATIVE 07/30/2022 1230   Sepsis Labs: @LABRCNTIP (procalcitonin:4,lacticidven:4)  Recent Results (from the past 240 hour(s))  SARS Coronavirus 2 by RT PCR (hospital order, performed in [facility] hospital lab) *cepheid single result test* Anterior Nasal Swab     Status: None   Collection Time: 09/13/22 12:24 PM   Specimen: Anterior Nasal Swab  Result Value Ref Range Status   SARS Coronavirus 2 by RT PCR NEGATIVE NEGATIVE Final    Comment: (NOTE) SARS-CoV-2 target nucleic acids are NOT DETECTED.  The SARS-CoV-2 RNA is generally detectable in upper and lower respiratory specimens during the acute phase of infection. The lowest concentration of SARS-CoV-2 viral copies this assay can detect is 250 copies / mL. A negative result does not preclude SARS-CoV-2 infection and should not be used as the sole basis for treatment or other patient management decisions.  A negative result may occur with improper specimen collection / handling, submission of specimen other than nasopharyngeal swab, presence of viral mutation(s) within the areas targeted by this assay, and inadequate number of viral copies (<250 copies / mL). A negative result must be combined with clinical observations, patient history, and epidemiological information.  Fact Sheet for Patients:   https://www.patel.info/  Fact Sheet for Healthcare Providers: https://hall.com/  This test is not yet approved or  cleared by the [place] FDA  and has been authorized for detection and/or diagnosis of SARS-CoV-2 by FDA under an Emergency Use Authorization (EUA).  This EUA will remain in effect (meaning this test can be used) for the duration of the COVID-19 declaration under Section 564(b)(1) of the Act, 21 U.S.C. section 360bbb-3(b)(1), unless the authorization is terminated or revoked sooner.  Performed at [facility], [address] [place], [place] [address]   Blood culture (single)     Status: None (Preliminary result)   Collection Time: 09/14/22  6:45 AM   Specimen: BLOOD  Result Value Ref Range Status   Specimen Description BLOOD RIGHT WRIST  Final   Special Requests   Final    BOTTLES DRAWN AEROBIC AND ANAEROBIC Blood Culture results may not be optimal due to an excessive volume of blood received in culture bottles   Culture   Final    NO GROWTH 3 DAYS Performed at [facility], [address], [place], [place] [address]    Report Status PENDING  Incomplete         Radiology Studies: DG Chest Portable 1 View  Result Date: 09/13/2022 CLINICAL DATA:  Dyspnea, COPD, recent diagnosis of pneumonia EXAM: PORTABLE CHEST 1 VIEW COMPARISON:  07/30/2022 chest radiograph. FINDINGS: Surgical hardware from ACDF overlies the lower cervical spine. Stable cardiomediastinal silhouette with top-normal heart size. No pneumothorax. Small left pleural effusion, similar, possibly loculated. No significant right pleural effusion. Patchy hazy opacity in the peripheral left mid lung, decreased. No overt pulmonary edema. IMPRESSION: Patchy hazy opacity in the peripheral left mid lung, decreased, favor improving pneumonia superimposed on chronic lingular scarring as seen on 07/04/2022 chest CT angiogram study. Similar small left pleural effusion, possibly loculated. Continued chest radiograph follow-up advised in 2-3 months or earlier as clinically warranted. Electronically Signed   By: Ilona Sorrel M.D.   On: 09/13/2022  12:41   No results found.          LOS: 3 days       Emeterio Reeve, DO [facility] 09/17/2022, 1:15 PM    Dictation software  may have been used to generate the above note. Typos may occur and escape review in typed/dictated notes. Please contact Dr Sheppard Coil directly for clarity if needed.  Staff may message me via secure chat in [place]  but this may not receive an immediate response,  please page me for urgent matters!  If 7PM-7AM, please contact night coverage [email or web address]

## 2022-09-17 NOTE — TOC Initial Note (Signed)
Transition of Care [facility]) - Initial/Assessment Note    Patient Details  Name: Suzanne Glenn MRN: [id number] Date of Birth: 1951/09/29  [facility] CM/SW Contact:    Gerilyn Pilgrim, LCSW Phone Number: 09/17/2022, 3:52 PM  Clinical Narrative:                 SW spoke with husband who stated the patient gets oxygen through [employer]. Husband states patient is on 02 24/7 at 4L. Husband reports patient does not need any assistance with ADLS at baseline. Husband states he does not feel he needs any additional assist at home. [facility] [place]. Pt currently on 6L HFNC- trying to wean. TOC will continue to follow.   Expected Discharge Plan: Home/Self Care Barriers to Discharge: Continued Medical Work up   Patient Goals and CMS Choice Patient states their goals for this hospitalization and ongoing recovery are:: Return home      Expected Discharge Plan and Services Expected Discharge Plan: Home/Self Care                           DME Agency: [employer]                  Prior Living Arrangements/Services   Lives with:: Self Patient language and need for interpreter reviewed:: No Do you feel safe going back to the place where you live?: Yes      Need for Family Participation in Patient Care: Yes (Comment) Care giver support system in place?: Yes (comment) (husband) Current home services: DME (Oxygen through [employer]- wears 24/7 on 4L at baseline.) Criminal Activity/Legal Involvement Pertinent to Current Situation/Hospitalization: No - Comment as needed  Activities of Daily Living Home Assistive Devices/Equipment: None ADL Screening (condition at time of admission) Patient's cognitive ability adequate to safely complete daily activities?: Yes Is the patient deaf or have difficulty hearing?: No Does the patient have difficulty seeing, even when wearing glasses/contacts?: No Does the patient have difficulty concentrating, remembering, or  making decisions?: No Patient able to express need for assistance with ADLs?: Yes Does the patient have difficulty dressing or bathing?: No Independently performs ADLs?: Yes (appropriate for developmental age) Does the patient have difficulty walking or climbing stairs?: No Weakness of Legs: None Weakness of Arms/Hands: None  Permission Sought/Granted Permission sought to share information with : Family Supports                Emotional Assessment       Orientation: : Oriented to Self, Oriented to Place, Oriented to  Time Alcohol / Substance Use: Never Used    Admission diagnosis:  COPD exacerbation ([place]) [J44.1] Acute on chronic hypoxic respiratory failure (HCC) [J96.21] Patient Active Problem List   Diagnosis Date Noted   COPD exacerbation ([place]) 09/13/2022   Acute respiratory failure with hypoxia and hypercapnia ([place]) 07/30/2022   Lobar pneumonia ([place]) 07/30/2022   Obesity (BMI 30-39.9) 07/30/2022   Acute on chronic respiratory failure ([place]) 08/19/2021   COPD with acute exacerbation ([place]) 04/14/2021   HLD (hyperlipidemia) 04/14/2021   Diabetes mellitus without complication ([place]) 83/15/1761   Depression 04/14/2021   CAD (coronary artery disease) 04/14/2021   Iron deficiency anemia 04/14/2021   Sepsis ([place]) 04/14/2021   RLS (restless legs syndrome) 04/14/2021   Acute on chronic respiratory failure with hypoxia ([place]) 03/20/2021   Atypical pneumonia 03/19/2021   MDD (major depressive disorder), recurrent, in full remission ([place]) 12/11/2019   Squamous  cell carcinoma of left lung ([place]) 07/26/2019   Goals of care, counseling/discussion 07/26/2019   Major depressive disorder, recurrent, in partial remission ([place]) 03/27/2019   Chronic respiratory failure with hypoxia ([place]) 03/27/2019   Urge incontinence of urine 03/27/2019   Tobacco use 01/13/2019   Mass of upper lobe of left lung 01/13/2019   Symptomatic anemia 01/01/2019   Restless leg syndrome 06/30/2018   Pure  hypercholesterolemia 09/26/2017   Sepsis due to pneumonia ([place]) 02/24/2017   Bilateral carotid artery stenosis 03/30/2016   Syncope and collapse 03/30/2016   Essential hypertension 08/29/2015   Controlled type 2 diabetes mellitus without complication ([place]) 65/79/0383   Combined fat and carbohydrate induced hyperlipemia 06/30/2015   Clinical depression 05/05/2015   GAD (generalized anxiety disorder) 05/05/2015   Depression, major, recurrent, moderate ([place]) 05/05/2015   Benign essential HTN 04/05/2015   Diabetes mellitus, type 2 ([place]) 02/09/2015   Type 2 diabetes mellitus with hyperlipidemia ([place]) 02/09/2015   Cannot sleep 01/24/2015   Fibromyalgia 01/24/2015   CAFL (chronic airflow limitation) ([place]) 01/24/2015   H/O diabetes mellitus 01/24/2015   H/O hypercholesterolemia 01/24/2015   H/O: HTN (hypertension) 01/24/2015   Coronary artery disease 01/24/2015   Hypokalemia 01/24/2015   Arteriosclerosis of coronary artery 10/04/2014   Chest pain 10/04/2014   3-vessel CAD 08/02/2014   Acute chest pain 06/25/2014   SOB (shortness of breath) 06/25/2014   COPD, moderate ([place]) 06/08/2014   Moderate COPD (chronic obstructive pulmonary disease) ([place]) 06/08/2014   Gastro-esophageal reflux disease without esophagitis 05/31/2014   PCP:  Tracie Harrier, MD Pharmacy:   [facility], [place] [address] Phone: [phone] Fax: [phone]     Social Determinants of Health (SDOH) Interventions    Readmission Risk Interventions    08/03/2022    2:49 PM 08/22/2021   12:19 PM 08/20/2021    2:50 PM  Readmission Risk Prevention Plan  Transportation Screening Complete Complete Complete  PCP or Specialist Appt within 3-5 Days Complete  Complete  HRI or [place] Patient refused  Complete  Social Work Consult for [place] Planning/Counseling Complete  Complete  Palliative Care Screening Not Applicable  Not Applicable   Medication Review Press photographer) Complete  Complete  SW Recovery Care/Counseling Consult  Complete   Palliative Care Screening  Not [place]  Not Applicable

## 2022-09-17 NOTE — Inpatient Diabetes Management (Signed)
Inpatient Diabetes Program Recommendations  AACE/ADA: New Consensus Statement on Inpatient Glycemic Control   Target Ranges:  Prepandial:   less than 140 mg/dL      Peak postprandial:   less than 180 mg/dL (1-2 hours)      Critically ill patients:  140 - 180 mg/dL    Latest Reference Range & Units 09/16/22 08:18 09/16/22 12:06 09/16/22 16:49 09/16/22 21:54 09/17/22 07:28  Glucose-Capillary 70 - 99 mg/dL 128 (H) 325 (H) 345 (H) 119 (H) 108 (H)    Latest Reference Range & Units 09/15/22 07:49 09/15/22 11:50 09/15/22 16:15 09/15/22 21:40  Glucose-Capillary 70 - 99 mg/dL 157 (H) 365 (H) 275 (H) 118 (H)   Review of Glycemic Control  Diabetes history: DM2 Outpatient Diabetes medications: Metformin 500 mg BID Current orders for Inpatient glycemic control: Novolog 0-15 units TID with meals, Novolog 0-5 units QHS; Prednisone 50 mg QAM; Ensure Enlive 237 ml BID  Inpatient Diabetes Program Recommendations:    Post prandial glucose is consistently elevated and per chart only eating 25% of meals. May want to consider changing Ensure Enlive supplements to supplement with less carbohydrates.  Thanks, Barnie Alderman, RN, MSN, [place] Diabetes Coordinator Inpatient Diabetes Program [phone] (Team Pager from 8am to [place])

## 2022-09-18 DIAGNOSIS — J441 Chronic obstructive pulmonary disease with (acute) exacerbation: Secondary | ICD-10-CM | POA: Diagnosis not present

## 2022-09-18 DIAGNOSIS — F3342 Major depressive disorder, recurrent, in full remission: Secondary | ICD-10-CM | POA: Diagnosis not present

## 2022-09-18 DIAGNOSIS — E785 Hyperlipidemia, unspecified: Secondary | ICD-10-CM | POA: Diagnosis not present

## 2022-09-18 DIAGNOSIS — I10 Essential (primary) hypertension: Secondary | ICD-10-CM | POA: Diagnosis not present

## 2022-09-18 LAB — RESPIRATORY PANEL BY PCR

## 2022-09-18 LAB — BASIC METABOLIC PANEL
Anion gap: 7 (ref 5–15)
BUN: 23 mg/dL (ref 8–23)
CO2: 44 mmol/L — ABNORMAL HIGH (ref 22–32)
Calcium: 8.8 mg/dL — ABNORMAL LOW (ref 8.9–10.3)
Chloride: 90 mmol/L — ABNORMAL LOW (ref 98–111)
Creatinine, Ser: 0.4 mg/dL — ABNORMAL LOW (ref 0.44–1.00)
GFR, Estimated: >60 mL/min (ref >60)
Glucose, Bld: 113 mg/dL — ABNORMAL HIGH (ref 70–99)
Potassium: 4.1 mmol/L (ref 3.5–5.1)
Sodium: 141 mmol/L (ref 135–145)

## 2022-09-18 LAB — CBC
HCT: 28.2 % — ABNORMAL LOW (ref 36.0–46.0)
Hemoglobin: 8.1 g/dL — ABNORMAL LOW (ref 12.0–15.0)
MCH: 27.8 pg (ref 26.0–34.0)
MCHC: 28.7 g/dL — ABNORMAL LOW (ref 30.0–36.0)
MCV: 96.9 fL (ref 80.0–100.0)
Platelets: 286 10*3/uL (ref 150–400)
RBC: 2.91 MIL/uL — ABNORMAL LOW (ref 3.87–5.11)
RDW: 15.3 % (ref 11.5–15.5)
WBC: 9.8 10*3/uL (ref 4.0–10.5)
nRBC: 0.3 % — ABNORMAL HIGH (ref 0.0–0.2)

## 2022-09-18 LAB — GLUCOSE, CAPILLARY
Glucose-Capillary: 113 mg/dL — ABNORMAL HIGH (ref 70–99)
Glucose-Capillary: 303 mg/dL — ABNORMAL HIGH (ref 70–99)
Glucose-Capillary: 253 mg/dL — ABNORMAL HIGH (ref 70–99)
Glucose-Capillary: 295 mg/dL — ABNORMAL HIGH (ref 70–99)

## 2022-09-18 LAB — C-REACTIVE PROTEIN: CRP: 0.5 mg/dL (ref <1.0)

## 2022-09-18 MED ORDER — TORSEMIDE 20 MG PO TABS
20.0000 mg | ORAL_TABLET | Freq: Every day | ORAL | Status: DC
  Administered 2022-09-18 – 2022-09-20 (×3): 20 mg via ORAL
  Filled 2022-09-18 (×3): qty 1

## 2022-09-18 NOTE — Progress Notes (Signed)
PROGRESS NOTE    Suzanne Glenn   JME:[id number] DOB: 1951/08/04  DOA: 09/13/2022 Date of Service: 09/18/22 PCP: Tracie Harrier, MD     Brief Narrative / Hospital Course:  Suzanne Glenn is a 71 year old female with history of COPD current tobacco use, oxygen supplementation at 4 L nasal cannula, GERD, hyperlipidemia, hypertension, who presents to emergency department for chief concerns of shortness of breath. 11/30: afebrile, tachypneic to 29, HR and BP WNL. Mild hyperglycemia, no other concerns on CMP. BNP 770. Procalcitonin <0.10. WBC 12.3, Hgb 9.1. Neg COVID. Per ED documentation, EMS gave patient 1 breathing treatment and gave patient 1 dose of Solu-Medrol 125 mg IV one-time dose. CXR: "Patchy hazy opacity in the peripheral left mid lung, decreased, favor improving pneumonia superimposed on chronic lingular scarring as seen on 07/04/2022 chest CT angiogram study. Similar small left pleural effusion, possibly loculated. Repeat CXR advised in 2-3 months or earlier as clinically warranted." ED treatment: DuoNebs x3, ceftriaxone 2 g one-time dose, azithromycin 500 mg IV one-time dose. Admitted to hpspitalist service.  12/01: overnight/early AM, requiring more O2 and needing BiPap. She is alert on rounds, she is not comfortable w/ the BiPap mask but husband is encouraging her to continue to wear it. Plan CT chest given deterioration and concern for possible loculated effusion on XR. Reexamined in ED later in afternoon - pt reports feeling a bit better. Tachycardic and hypertensive - IV meds while npo on bipap, still tachypneic but SpO2 okay. Lung sounds same. CT chest no PE, (+)new right upper lobe airspace consolidation worrisome for pneumonia.  New patchy and ill-defined nodular densities in the bilateral upper lobes favored as infectious/inflammatory. 12/02: Off BiPap today while awake, still on 6L [place]. Continue current treatment.  12/03-12/04: still needing BiPap overnight. On 6L [facility]NC  this morning and into the afternoon. Will ask pulmonary team to evaluate.  12/05: Pulmonology saw pt - Dr Lanney Gins recs: suspect this is likely viral LRTI acute exacerbation with atelectasis. Obtain serum fungitell, legionella ab, strep pneumoniae ur AG, Histoplasma Ur Ag, sputum resp cultures, AFB sputum expectorated specimen, sputum cytology, ESR/CRP. Encourage patient to use incentive spirometer few times each hour while hospitalized.  Add torsemide - allergy is not clinically significant. D/c amlodipine for now to avoid transient hypotension. Add metaneb qid for compressive atelectasis. Needing up to 7L HFNC this afternoon.     Consultants:  none  Procedures: none      ASSESSMENT & PLAN:   Principal Problem:   COPD exacerbation ([place]) Active Problems:   Essential hypertension   Type 2 diabetes mellitus with hyperlipidemia (HCC)   Tobacco use   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HCC)   MDD (major depressive disorder), recurrent, in full remission ([place])   HLD (hyperlipidemia)   RLS (restless legs syndrome)  COPD exacerbation ([place]) Presumed secondary to continued tobacco use, complicated by recent exposure to a young child w/ respiratory illness Off BiPap during the day, still needing 6-7L [place] while awake  DuoNebs Solu-Medrol 40 mg IV twice daily, 2 doses ordered for 09/14/2022 and will transition to po after that  Procalcitonin was negative, but given worsening and CT concerning for PNA will continue abx w/ azithro/ceftriax slow to improve, may need BiPap at home - plan pulmonary consult  Pulmonary recs: suspect this is likely viral LRTI acute exacerbation with atelectasis.  Obtain serum fungitell, legionella ab, strep pneumoniae ur AG, Histoplasma Ur Ag, sputum resp cultures, AFB sputum, expectorated specimen, sputum cytology, ESR/CRP.  Encourage patient  to use incentive spirometer few times each hour while hospitalized.   Add torsemide - allergy is not clinically  significant. D/c amlodipine for now to avoid transient hypotension.  Add metaneb qid for compressive atelectasis.   HLD (hyperlipidemia) Pravastatin 80 mg q. evening resumed   MDD (major depressive disorder), recurrent, in full remission (HCC) Venlafaxine 225 mg daily resumed   Tobacco use Nicotine as needed Patch ordered   Type 2 diabetes mellitus with hyperlipidemia (HCC) Home metformin not resumed on admission Insulin SSI with at bedtime coverage ordered Goal inpatient blood glucose level is 140-180   Essential hypertension Amlodipine 10 mg daily, metoprolol tartrate 25 mg p.o. twice daily, spironolactone 25 mg daily, isosorbide mononitrate 30 mg daily resumed       DVT prophylaxis: Lovenox  Pertinent IV fluids/nutrition: no continuous IV fluids Central lines / invasive devices: none   Code Status: FULL CODE   Disposition: inpatient  TOC needs: none at this time, may need STR/HH and new DME  Barriers to discharge / significant pending items: O2 requirement w/ BiPap support              Subjective:  Patient reports breathing about the same as yesterday, difficulty w/ minimal exertion  Denies CP.  Pain controlled.  Denies new weakness.  Tolerating diet.  Reports no concerns w/ urination/defecation.   Family Communication: will call husband later today to update.     Objective Findings:  Vitals:   09/18/22 0930 09/18/22 1114 09/18/22 1205 09/18/22 1300  BP:   129/82   Pulse:   (!) 101 93  Resp: (!) _0 Temp:   99 F (37.2 C)   TempSrc:   Oral   SpO2:  98% 100% 97%  Weight:      Height:        Intake/Output Summary (Last 24 hours) at 09/18/2022 1442 Last data filed at 09/18/2022 1221 Gross per 24 hour  Intake 477 ml  Output 2100 ml  Net -1623 ml   Filed Weights   09/13/22 1121 09/17/22 0431 09/18/22 0532  Weight: 83.9 kg 84.5 kg 84.7 kg    Examination:  Constitutional:  VS as above General Appearance: alert, well-developed,  well-nourished, NAD Neck: No masses, trachea midline Respiratory: Fair respiratory effort + wheeze scattered + rhonchi - slight improved from yesterday No rales Cardiovascular: S1/S2 normal No murmur No lower extremity edema Gastrointestinal: No tenderness Musculoskeletal:  No clubbing/cyanosis of digits Symmetrical movement in all extremities Neurological: No cranial nerve deficit on limited exam Alert Psychiatric: Normal judgment/insight Normal mood and affect       Scheduled Medications:   aspirin  81 mg Oral Daily   carbidopa-levodopa  1 tablet Oral TID   enoxaparin (LOVENOX) injection  0.5 mg/kg Subcutaneous Q24H   feeding supplement  237 mL Oral BID BM   fluticasone furoate-vilanterol  1 puff Inhalation Daily   And   umeclidinium bromide  1 puff Inhalation Daily   gabapentin  300 mg Oral TID   influenza vaccine adjuvanted  0.5 mL Intramuscular Tomorrow-1000   insulin aspart  0-15 Units Subcutaneous TID WC   insulin aspart  0-5 Units Subcutaneous QHS   ipratropium-albuterol  3 mL Nebulization Q6H   isosorbide mononitrate  30 mg Oral Daily   metoprolol tartrate  5 mg Intravenous Q8H   pantoprazole  80 mg Oral Q1200   pneumococcal 20-valent conjugate vaccine  0.5 mL Intramuscular Tomorrow-1000   pravastatin  80 mg Oral QPM   spironolactone  25 mg Oral Daily   torsemide  20 mg Oral Daily   venlafaxine XR  150 mg Oral Daily   venlafaxine XR  75 mg Oral Daily    Continuous Infusions:    PRN Medications:  ALPRAZolam, hydrALAZINE, ipratropium-albuterol, nicotine, mouth rinse, senna-docusate  Antimicrobials:  Anti-infectives (From admission, onward)    Start     Dose/Rate Route Frequency Ordered Stop   09/14/22 1500  azithromycin (ZITHROMAX) 500 mg in sodium chloride 0.9 % 250 mL IVPB        500 mg 250 mL/hr over 60 Minutes Intravenous Every 24 hours 09/14/22 1341 09/17/22 1703   09/14/22 1400  cefTRIAXone (ROCEPHIN) 2 g in sodium chloride 0.9 % 100 mL  IVPB        2 g 200 mL/hr over 30 Minutes Intravenous Every 24 hours 09/14/22 1341 09/17/22 1421   09/13/22 1415  vancomycin (VANCOREADY) IVPB 1750 mg/350 mL  Status:  Discontinued        1,750 mg 175 mL/hr over 120 Minutes Intravenous  Once 09/13/22 1343 09/13/22 1344   09/13/22 1345  ceFEPIme (MAXIPIME) 2 g in sodium chloride 0.9 % 100 mL IVPB  Status:  Discontinued        2 g 200 mL/hr over 30 Minutes Intravenous  Once 09/13/22 1343 09/13/22 1344   09/13/22 1345  cefTRIAXone (ROCEPHIN) 2 g in sodium chloride 0.9 % 100 mL IVPB        2 g 200 mL/hr over 30 Minutes Intravenous  Once 09/13/22 1344 09/13/22 1505   09/13/22 1345  azithromycin (ZITHROMAX) 500 mg in sodium chloride 0.9 % 250 mL IVPB        500 mg 250 mL/hr over 60 Minutes Intravenous  Once 09/13/22 1344 09/13/22 1725           Data Reviewed: I have personally reviewed following labs and imaging studies  CBC: Recent Labs  Lab 09/13/22 1126 09/14/22 0645 09/15/22 0526 09/17/22 0502 09/18/22 0553  WBC 12.3* 12.5* 10.3 10.3 9.8  NEUTROABS 10.7*  --   --   --   --   HGB 9.1* 8.3* 8.5* 8.0* 8.1*  HCT 31.6* 28.5* 28.9* 28.7* 28.2*  MCV 96.9 95.0 94.8 99.0 96.9  PLT 332 349 317 281 701   Basic Metabolic Panel: Recent Labs  Lab 09/13/22 1126 09/14/22 0645 09/15/22 0526 09/17/22 0502 09/18/22 0553  NA 140 140 141 143 141  K 4.3 4.9 4.4 4.1 4.1  CL 88* 88* 89* 91* 90*  CO2 44* 43* 42* >45* 44*  GLUCOSE 165* 146* 147* 111* 113*  BUN 16 21 27* 33* 23  CREATININE 0.52 0.54 0.57 0.53 0.40*  CALCIUM 9.3 9.3 9.2 9.0 8.8*   GFR: Estimated Creatinine Clearance: 67.5 mL/min (A) (by C-G formula based on SCr of 0.4 mg/dL (L)). Liver Function Tests: Recent Labs  Lab 09/13/22 1126  AST 14*  ALT <5  ALKPHOS 67  BILITOT 0.5  PROT 6.7  ALBUMIN 3.5   No results for input(s): "LIPASE", "AMYLASE" in the last 168 hours. No results for input(s): "AMMONIA" in the last 168 hours. Coagulation Profile: No results for  input(s): "INR", "PROTIME" in the last 168 hours. Cardiac Enzymes: No results for input(s): "CKTOTAL", "CKMB", "CKMBINDEX", "TROPONINI" in the last 168 hours. BNP (last 3 results) No results for input(s): "PROBNP" in the last 8760 hours. HbA1C: No results for input(s): "HGBA1C" in the last 72 hours. CBG: Recent Labs  Lab 09/17/22 1453 09/17/22 2042 09/17/22 2234 09/18/22 0821 09/18/22  [place]* 226* 161* 113* 303*   Lipid Profile: No results for input(s): "CHOL", "HDL", "LDLCALC", "TRIG", "CHOLHDL", "LDLDIRECT" in the last 72 hours. Thyroid Function Tests: No results for input(s): "TSH", "T4TOTAL", "FREET4", "T3FREE", "THYROIDAB" in the last 72 hours. Anemia Panel: No results for input(s): "VITAMINB12", "FOLATE", "FERRITIN", "TIBC", "IRON", "RETICCTPCT" in the last 72 hours. Most Recent Urinalysis On File:     Component Value Date/Time   COLORURINE YELLOW (A) 07/30/2022 1230   APPEARANCEUR HAZY (A) 07/30/2022 1230   LABSPEC 1.021 07/30/2022 1230   PHURINE 5.0 07/30/2022 1230   GLUCOSEU NEGATIVE 07/30/2022 1230   HGBUR SMALL (A) 07/30/2022 1230   BILIRUBINUR NEGATIVE 07/30/2022 1230   KETONESUR 5 (A) 07/30/2022 1230   PROTEINUR 100 (A) 07/30/2022 1230   NITRITE NEGATIVE 07/30/2022 1230   LEUKOCYTESUR NEGATIVE 07/30/2022 1230   Sepsis Labs: _0 (procalcitonin:4,lacticidven:4)  Recent Results (from the past 240 hour(s))  SARS Coronavirus 2 by RT PCR (hospital order, performed in [place] hospital lab) *cepheid single result test* Anterior Nasal Swab     Status: None   Collection Time: 09/13/22 12:24 PM   Specimen: Anterior Nasal Swab  Result Value Ref Range Status   SARS Coronavirus 2 by RT PCR NEGATIVE NEGATIVE Final    Comment: (NOTE) SARS-CoV-2 target nucleic acids are NOT DETECTED.  The SARS-CoV-2 RNA is generally detectable in upper and lower respiratory specimens during the acute phase of infection. The lowest concentration of SARS-CoV-2 viral  copies this assay can detect is 250 copies / mL. A negative result does not preclude SARS-CoV-2 infection and should not be used as the sole basis for treatment or other patient management decisions.  A negative result may occur with improper specimen collection / handling, submission of specimen other than nasopharyngeal swab, presence of viral mutation(s) within the areas targeted by this assay, and inadequate number of viral copies (<250 copies / mL). A negative result must be combined with clinical observations, patient history, and epidemiological information.  Fact Sheet for Patients:   https://www.patel.info/  Fact Sheet for Healthcare Providers: https://hall.com/  This test is not yet approved or  cleared by the [place] FDA and has been authorized for detection and/or diagnosis of SARS-CoV-2 by FDA under an Emergency Use Authorization (EUA).  This EUA will remain in effect (meaning this test can be used) for the duration of the COVID-19 declaration under Section 564(b)(1) of the Act, 21 U.S.C. section 360bbb-3(b)(1), unless the authorization is terminated or revoked sooner.  Performed at [facility], [address] [place], [place] [address]   Blood culture (single)     Status: None (Preliminary result)   Collection Time: 09/14/22  6:45 AM   Specimen: BLOOD  Result Value Ref Range Status   Specimen Description BLOOD RIGHT WRIST  Final   Special Requests   Final    BOTTLES DRAWN AEROBIC AND ANAEROBIC Blood Culture results may not be optimal due to an excessive volume of blood received in culture bottles   Culture   Final    NO GROWTH 4 DAYS Performed at [facility], [address], [place],  [address]    Report Status PENDING  Incomplete  Respiratory (~20 pathogens) panel by PCR     Status: None   Collection Time: 09/18/22  8:20 AM   Specimen: Nasopharyngeal Swab; Respiratory  Result Value  Ref Range Status   Adenovirus NOT DETECTED NOT DETECTED Final   Coronavirus 229E NOT DETECTED NOT DETECTED Final  Comment: (NOTE) The Coronavirus on the Respiratory Panel, DOES NOT test for the novel  Coronavirus (2019 nCoV)    Coronavirus HKU1 NOT DETECTED NOT DETECTED Final   Coronavirus NL63 NOT DETECTED NOT DETECTED Final   Coronavirus OC43 NOT DETECTED NOT DETECTED Final   Metapneumovirus NOT DETECTED NOT DETECTED Final   Rhinovirus / Enterovirus NOT DETECTED NOT DETECTED Final   Influenza A NOT DETECTED NOT DETECTED Final   Influenza B NOT DETECTED NOT DETECTED Final   Parainfluenza Virus 1 NOT DETECTED NOT DETECTED Final   Parainfluenza Virus 2 NOT DETECTED NOT DETECTED Final   Parainfluenza Virus 3 NOT DETECTED NOT DETECTED Final   Parainfluenza Virus 4 NOT DETECTED NOT DETECTED Final   Respiratory Syncytial Virus NOT DETECTED NOT DETECTED Final   Bordetella pertussis NOT DETECTED NOT DETECTED Final   Bordetella Parapertussis NOT DETECTED NOT DETECTED Final   Chlamydophila pneumoniae NOT DETECTED NOT DETECTED Final   Mycoplasma pneumoniae NOT DETECTED NOT DETECTED Final    Comment: Performed at [facility], [place] [address], [place], [place] [address]         Radiology Studies: DG Chest Portable 1 View  Result Date: 09/13/2022 CLINICAL DATA:  Dyspnea, COPD, recent diagnosis of pneumonia EXAM: PORTABLE CHEST 1 VIEW COMPARISON:  07/30/2022 chest radiograph. FINDINGS: Surgical hardware from [facility] overlies the lower cervical spine. Stable cardiomediastinal silhouette with top-normal heart size. No pneumothorax. Small left pleural effusion, similar, possibly loculated. No significant right pleural effusion. Patchy hazy opacity in the peripheral left mid lung, decreased. No overt pulmonary edema. IMPRESSION: Patchy hazy opacity in the peripheral left mid lung, decreased, favor improving pneumonia superimposed on chronic lingular scarring as seen on 07/04/2022 chest CT  angiogram study. Similar small left pleural effusion, possibly loculated. Continued chest radiograph follow-up advised in 2-3 months or earlier as clinically warranted. Electronically Signed   By: Ilona Sorrel M.D.   On: 09/13/2022 12:41   No results found.          LOS: 4 days       Emeterio Reeve, DO [facility] 09/18/2022, 2:42 PM    Dictation software may have been used to generate the above note. Typos may occur and escape review in typed/dictated notes. Please contact Dr Sheppard Coil directly for clarity if needed.  Staff may message me via secure chat in [place]  but this may not receive an immediate response,  please page me for urgent matters!  If 7PM-7AM, please contact night coverage [email or web address]

## 2022-09-18 NOTE — Consult Note (Signed)
PULMONOLOGY         Date: 09/18/2022,   MRN# [id number] Suzanne Glenn Feb 16, 1951     AdmissionWeight: 83.9 kg                 CurrentWeight: 84.7 kg  Referring provider: Dr Sheppard Coil   CHIEF COMPLAINT:   Acute on chronic hypoxemic respiratory failure   HISTORY OF PRESENT ILLNESS   This is a pleasant 71 year old female with a history of anxiety disorder with asthma, right upper lobe lung cancer, advanced COPD with centrilobular emphysema and chronic hypoxemia on supplemental oxygen at rest, coronary artery disease, major depressive disorder, diabetes type 2, gastroesophageal reflux disease, daily migraine headaches hypertension, dyslipidemia neuropathy and restless leg syndrome, who came in with respiratory distress however did not have severe hypoxemia and vital signs were essentially normal besides mild tachypnea she had blood work done which showed normal renal function mildly elevated blood glucose mild leukocytosis with heme with hemoglobin being 9.1 and WBC count being 12.3 she received Solu-Medrol x 1 nebulizer treatment in the ED followed by empiric Rocephin and Zithromax for possible.  She reports being around a young grandchild who possibly was sick around Thanksgiving.  She is a lifelong smoker and currently continues to smoke.  She had CT chest performed which I personally reviewed, showing a consolidated infiltrate of the right lower lobe with associated mild to moderate pleural effusion.   PAST MEDICAL HISTORY   Past Medical History:  Diagnosis Date   Anxiety    Asthma    Cancer ([place]) 12/2018   w/u for right upper lobe mass/cancer   COPD (chronic obstructive pulmonary disease) (HCC)    also, emphysema. now using o2 via np 24 hours a day   Coronary artery disease    Depression    Diabetes mellitus, type II (HCC)    GERD (gastroesophageal reflux disease)    Headache    migraines in early 20's   Heart murmur    HLD (hyperlipidemia)    Hypertension     Lung cancer ([place])    Neuropathy    Restless leg      SURGICAL HISTORY   Past Surgical History:  Procedure Laterality Date   BACK SURGERY  2005   surgery x 2, disc fused in neck, pinched nerve in center of back and neck   CARDIAC CATHETERIZATION  2015   1 stent placed for blockage   cervical bone infusion     CHOLECYSTECTOMY     DILATION AND CURETTAGE OF UTERUS     FOOT SURGERY Left    foot bone spur removed   THORACIC DISC SURGERY     TUBAL LIGATION     VIDEO BRONCHOSCOPY WITH ENDOBRONCHIAL ULTRASOUND N/A 03/06/2019   Procedure: VIDEO BRONCHOSCOPY WITH ENDOBRONCHIAL ULTRASOUND - DIABETIC;  Surgeon: Ottie Glazier, MD;  Location: [facility] ORS;  Service: Thoracic;  Laterality: N/A;     FAMILY HISTORY   Family History  Problem Relation Age of Onset   Anxiety disorder Mother    Cancer - Lung Mother    Depression Sister    Cancer Sister    Cancer Sister    Cancer Sister    Cancer Sister    Heart Problems Sister    Bipolar disorder Sister    Diabetes Sister    Cancer - Lung Sister    Hypertension Sister    Diabetes Sister    Hyperlipidemia Sister    COPD Sister    Depression Brother  Heart Problems Brother    Cancer Brother    Cancer Brother    Cancer Brother    Breast cancer Maternal Aunt    Depression Son      SOCIAL HISTORY   Social History   Tobacco Use   Smoking status: Every Day    Packs/day: 1.00    Years: 40.00    Total pack years: 40.00    Types: Cigarettes    Start date: 05/05/1975   Smokeless tobacco: Never  Vaping Use   Vaping Use: Never used  Substance Use Topics   Alcohol use: No    Alcohol/week: 0.0 standard drinks of alcohol    Comment: socially   Drug use: No     MEDICATIONS    Home Medication:    Current Medication:  Current Facility-Administered Medications:    ALPRAZolam (XANAX) tablet 0.5 mg, 0.5 mg, Oral, BID PRN, Emeterio Reeve, DO, 0.5 mg at 09/15/22 1737   amLODipine (NORVASC) tablet 10 mg, 10 mg, Oral, Daily,  Cox, Amy N, DO, 10 mg at 09/17/22 0845   aspirin chewable tablet 81 mg, 81 mg, Oral, Daily, Cox, Amy N, DO, 81 mg at 09/17/22 0845   carbidopa-levodopa (SINEMET IR) 25-100 MG per tablet immediate release 1 tablet, 1 tablet, Oral, TID, Cox, Amy N, DO, 1 tablet at 09/17/22 2215   enoxaparin (LOVENOX) injection 42.5 mg, 0.5 mg/kg, Subcutaneous, Q24H, Cox, Amy N, DO, 42.5 mg at 09/17/22 2229   feeding supplement (ENSURE ENLIVE / ENSURE PLUS) liquid 237 mL, 237 mL, Oral, BID BM, Cox, Amy N, DO, 237 mL at 09/17/22 1309   fluticasone furoate-vilanterol (BREO ELLIPTA) 100-25 MCG/ACT 1 puff, 1 puff, Inhalation, Daily, 1 puff at 09/17/22 0846 **AND** umeclidinium bromide (INCRUSE ELLIPTA) 62.5 MCG/ACT 1 puff, 1 puff, Inhalation, Daily, Cox, Amy N, DO, 1 puff at 09/17/22 0847   gabapentin (NEURONTIN) capsule 300 mg, 300 mg, Oral, TID, Cox, Amy N, DO, 300 mg at 09/17/22 2215   hydrALAZINE (APRESOLINE) injection 10 mg, 10 mg, Intravenous, Q8H PRN, Emeterio Reeve, DO   influenza vaccine adjuvanted (FLUAD) injection 0.5 mL, 0.5 mL, Intramuscular, Tomorrow-1000, Alexander, Natalie, DO   insulin aspart (novoLOG) injection 0-15 Units, 0-15 Units, Subcutaneous, TID WC, Cox, Amy N, DO, 15 Units at 09/17/22 1601   insulin aspart (novoLOG) injection 0-5 Units, 0-5 Units, Subcutaneous, QHS, Cox, Amy N, DO   ipratropium-albuterol (DUONEB) 0.5-2.5 (3) MG/3ML nebulizer solution 3 mL, 3 mL, Nebulization, Q4H PRN, Sharion Settler, NP   ipratropium-albuterol (DUONEB) 0.5-2.5 (3) MG/3ML nebulizer solution 3 mL, 3 mL, Nebulization, Q6H, Emeterio Reeve, DO, 3 mL at 09/18/22 0142   isosorbide mononitrate (IMDUR) 24 hr tablet 30 mg, 30 mg, Oral, Daily, Cox, Amy N, DO, 30 mg at 09/17/22 0845   metoprolol tartrate (LOPRESSOR) injection 5 mg, 5 mg, Intravenous, Q8H, Emeterio Reeve, DO, 5 mg at 09/18/22 0535   nicotine (NICODERM CQ - dosed in mg/24 hours) patch 14 mg, 14 mg, Transdermal, Daily PRN, Cox, Amy N, DO, 14 mg at  09/13/22 2137   ondansetron (ZOFRAN) tablet 4 mg, 4 mg, Oral, Q6H PRN **OR** ondansetron (ZOFRAN) injection 4 mg, 4 mg, Intravenous, Q6H PRN, Cox, Amy N, DO   Oral care mouth rinse, 15 mL, Mouth Rinse, PRN, Emeterio Reeve, DO   pantoprazole (PROTONIX) EC tablet 80 mg, 80 mg, Oral, Q1200, Cox, Amy N, DO, 80 mg at 09/17/22 1347   pneumococcal 20-valent conjugate vaccine (PREVNAR 20) injection 0.5 mL, 0.5 mL, Intramuscular, Tomorrow-1000, Emeterio Reeve, DO   pravastatin (  PRAVACHOL) tablet 80 mg, 80 mg, Oral, QPM, Cox, Amy N, DO, 80 mg at 09/17/22 1819   predniSONE (DELTASONE) tablet 50 mg, 50 mg, Oral, Q breakfast, Emeterio Reeve, DO, 50 mg at 09/17/22 0846   senna-docusate (Senokot-S) tablet 1 tablet, 1 tablet, Oral, QHS PRN, Cox, Amy N, DO   spironolactone (ALDACTONE) tablet 25 mg, 25 mg, Oral, Daily, Cox, Amy N, DO, 25 mg at 09/17/22 0846   venlafaxine XR (EFFEXOR-XR) 24 hr capsule 150 mg, 150 mg, Oral, Daily, Cox, Amy N, DO, 150 mg at 09/17/22 0845   venlafaxine XR (EFFEXOR-XR) 24 hr capsule 75 mg, 75 mg, Oral, Daily, Cox, Amy N, DO, 75 mg at 09/17/22 0847    ALLERGIES   Diclofenac-misoprostol, Nsaids, Zolpidem, Hydrocodone-acetaminophen, and Simvastatin     REVIEW OF SYSTEMS    Review of Systems:  Gen:  Denies  fever, sweats, chills weigh loss  HEENT: Denies blurred vision, double vision, ear pain, eye pain, hearing loss, nose bleeds, sore throat Cardiac:  No dizziness, chest pain or heaviness, chest tightness,edema Resp:   reports dyspnea chronically  Gi: Denies swallowing difficulty, stomach pain, nausea or vomiting, diarrhea, constipation, bowel incontinence Gu:  Denies bladder incontinence, burning urine Ext:   Denies Joint pain, stiffness or swelling Skin: Denies  skin rash, easy bruising or bleeding or hives Endoc:  Denies polyuria, polydipsia , polyphagia or weight change Psych:   Denies depression, insomnia or hallucinations   Other:  All other systems  negative   VS: BP (!) 161/87 (BP Location: Right Arm)   Pulse 72   Temp 98.7 F (37.1 C) (Oral)   Resp 16   Ht _0  (1.6 m)   Wt 84.7 kg   SpO2 95%   BMI 33.07 kg/m      PHYSICAL EXAM    GENERAL:NAD, no fevers, chills, no weakness no fatigue HEAD: Normocephalic, atraumatic.  EYES: Pupils equal, round, reactive to light. Extraocular muscles intact. No scleral icterus.  MOUTH: Moist mucosal membrane. Dentition intact. No abscess noted.  EAR, NOSE, THROAT: Clear without exudates. No external lesions.  NECK: Supple. No thyromegaly. No nodules. No JVD.  PULMONARY: decreased breath sounds with mild rhonchi worse at bases bilaterally.  CARDIOVASCULAR: S1 and S2. Regular rate and rhythm. No murmurs, rubs, or gallops. No edema. Pedal pulses 2+ bilaterally.  GASTROINTESTINAL: Soft, nontender, nondistended. No masses. Positive bowel sounds. No hepatosplenomegaly.  MUSCULOSKELETAL: No swelling, clubbing, or edema. Range of motion full in all extremities.  NEUROLOGIC: Cranial nerves II through XII are intact. No gross focal neurological deficits. Sensation intact. Reflexes intact.  SKIN: No ulceration, lesions, rashes, or cyanosis. Skin warm and dry. Turgor intact.  PSYCHIATRIC: Mood, affect within normal limits. The patient is awake, alert and oriented x 3. Insight, judgment intact.       IMAGING     ASSESSMENT/PLAN   Acute hypoxemic respiratory failure - present on admission  - COVID19 -negative   - supplemental O2 during my evaluation -4L/min - suspect this is likely viral LRTI acute exacerbation with atelectasis -serum fungitell -legionella ab -strep pneumoniae ur AG -Histoplasma Ur Ag -sputum resp cultures -AFB sputum expectorated specimen -sputum cytology  -reviewed pertinent imaging with patient today - ESR/CRP -PT/OT for d/c planning  -please encourage patient to use incentive spirometer few times each hour while hospitalized.       Right moderate pleural  effusion           - noted patient on aldactone , will add torsemide - allergy  is not clinically significant    -dc amlodipine for now to avoid transient hypotension   Compressiive atelectasis    - need more aggressive bph with metaneb qid        Thank you for allowing me to participate in the care of this patient.   Patient/Family are satisfied with care plan and all questions have been answered.    Provider disclosure: Patient with at least one acute or chronic illness or injury that poses a threat to life or bodily function and is being managed actively during this encounter.  All of the below services have been performed independently by signing provider:  review of prior documentation from internal and or external health records.  Review of previous and current lab results.  Interview and comprehensive assessment during patient visit today. Review of current and previous chest radiographs/CT scans. Discussion of management and test interpretation with health care team and patient/family.   This document was prepared using Dragon voice recognition software and may include unintentional dictation errors.     Ottie Glazier, M.D.  Division of Pulmonary & Critical Care Medicine

## 2022-09-19 ENCOUNTER — Other Ambulatory Visit: Payer: Self-pay | Admitting: Psychiatry

## 2022-09-19 DIAGNOSIS — J441 Chronic obstructive pulmonary disease with (acute) exacerbation: Secondary | ICD-10-CM | POA: Diagnosis not present

## 2022-09-19 LAB — CBC
HCT: 30.8 % — ABNORMAL LOW (ref 36.0–46.0)
Hemoglobin: 8.8 g/dL — ABNORMAL LOW (ref 12.0–15.0)
MCH: 27.2 pg (ref 26.0–34.0)
MCHC: 28.6 g/dL — ABNORMAL LOW (ref 30.0–36.0)
MCV: 95.1 fL (ref 80.0–100.0)
Platelets: 283 10*3/uL (ref 150–400)
RBC: 3.24 MIL/uL — ABNORMAL LOW (ref 3.87–5.11)
RDW: 15.2 % (ref 11.5–15.5)
WBC: 9.8 10*3/uL (ref 4.0–10.5)
nRBC: 0 % (ref 0.0–0.2)

## 2022-09-19 LAB — CULTURE, BLOOD (SINGLE): Culture: NO GROWTH

## 2022-09-19 LAB — BASIC METABOLIC PANEL
BUN: 27 mg/dL — ABNORMAL HIGH (ref 8–23)
CO2: >45 mmol/L — ABNORMAL HIGH (ref 22–32)
Calcium: 9.2 mg/dL (ref 8.9–10.3)
Chloride: 87 mmol/L — ABNORMAL LOW (ref 98–111)
Creatinine, Ser: 0.61 mg/dL (ref 0.44–1.00)
GFR, Estimated: >60 mL/min (ref >60)
Glucose, Bld: 113 mg/dL — ABNORMAL HIGH (ref 70–99)
Potassium: 4.1 mmol/L (ref 3.5–5.1)
Sodium: 140 mmol/L (ref 135–145)

## 2022-09-19 LAB — MAGNESIUM: Magnesium: 1.9 mg/dL (ref 1.7–2.4)

## 2022-09-19 LAB — GLUCOSE, CAPILLARY
Glucose-Capillary: 107 mg/dL — ABNORMAL HIGH (ref 70–99)
Glucose-Capillary: 295 mg/dL — ABNORMAL HIGH (ref 70–99)
Glucose-Capillary: 314 mg/dL — ABNORMAL HIGH (ref 70–99)
Glucose-Capillary: 128 mg/dL — ABNORMAL HIGH (ref 70–99)

## 2022-09-19 LAB — IRON AND TIBC
Iron: 29 ug/dL (ref 28–170)
Saturation Ratios: 6 % — ABNORMAL LOW (ref 10.4–31.8)
TIBC: 469 ug/dL — ABNORMAL HIGH (ref 250–450)
UIBC: 440 ug/dL

## 2022-09-19 LAB — VITAMIN B12: Vitamin B-12: 1072 pg/mL — ABNORMAL HIGH (ref 180–914)

## 2022-09-19 LAB — FOLATE: Folate: 13.8 ng/mL (ref >5.9)

## 2022-09-19 LAB — VITAMIN D 25 HYDROXY (VIT D DEFICIENCY, FRACTURES): Vit D, 25-Hydroxy: 21.03 ng/mL — ABNORMAL LOW (ref 30–100)

## 2022-09-19 LAB — PHOSPHORUS: Phosphorus: 3.9 mg/dL (ref 2.5–4.6)

## 2022-09-19 MED ORDER — GUAIFENESIN ER 600 MG PO TB12
600.0000 mg | ORAL_TABLET | Freq: Two times a day (BID) | ORAL | Status: DC
  Administered 2022-09-19 – 2022-09-20 (×3): 600 mg via ORAL
  Filled 2022-09-19 (×3): qty 1

## 2022-09-19 MED ORDER — POLYSACCHARIDE IRON COMPLEX 150 MG PO CAPS
150.0000 mg | ORAL_CAPSULE | Freq: Every day | ORAL | Status: DC
  Administered 2022-09-19 – 2022-09-20 (×2): 150 mg via ORAL
  Filled 2022-09-19 (×2): qty 1

## 2022-09-19 MED ORDER — ALBUTEROL SULFATE (2.5 MG/3ML) 0.083% IN NEBU: 2.5000 mg | INHALATION_SOLUTION | RESPIRATORY_TRACT | Status: DC | PRN

## 2022-09-19 MED ORDER — HYDROCOD POLI-CHLORPHE POLI ER 10-8 MG/5ML PO SUER: 5.0000 mL | Freq: Two times a day (BID) | ORAL | Status: DC | PRN

## 2022-09-19 MED ORDER — METOPROLOL TARTRATE 25 MG PO TABS
25.0000 mg | ORAL_TABLET | Freq: Two times a day (BID) | ORAL | Status: DC
  Administered 2022-09-19 – 2022-09-20 (×3): 25 mg via ORAL
  Filled 2022-09-19 (×3): qty 1

## 2022-09-19 MED ORDER — IPRATROPIUM-ALBUTEROL 0.5-2.5 (3) MG/3ML IN SOLN
3.0000 mL | Freq: Three times a day (TID) | RESPIRATORY_TRACT | Status: DC
  Administered 2022-09-19 – 2022-09-20 (×4): 3 mL via RESPIRATORY_TRACT
  Filled 2022-09-19 (×4): qty 3

## 2022-09-19 MED ORDER — HYDROCODONE BIT-HOMATROP MBR 5-1.5 MG/5ML PO SOLN: 5.0000 mL | Freq: Four times a day (QID) | ORAL | Status: DC | PRN

## 2022-09-19 MED ORDER — VITAMIN D (ERGOCALCIFEROL) 1.25 MG (50000 UNIT) PO CAPS
50000.0000 [IU] | ORAL_CAPSULE | ORAL | Status: DC
  Administered 2022-09-19: 50000 [IU] via ORAL
  Filled 2022-09-19: qty 1

## 2022-09-19 MED ORDER — PREDNISONE 20 MG PO TABS
40.0000 mg | ORAL_TABLET | Freq: Every day | ORAL | Status: DC
  Administered 2022-09-19 – 2022-09-20 (×2): 40 mg via ORAL
  Filled 2022-09-19 (×2): qty 2

## 2022-09-19 MED ORDER — AZITHROMYCIN 250 MG PO TABS
250.0000 mg | ORAL_TABLET | Freq: Every day | ORAL | Status: DC
  Administered 2022-09-19 – 2022-09-20 (×2): 250 mg via ORAL
  Filled 2022-09-19 (×2): qty 1

## 2022-09-19 NOTE — Progress Notes (Deleted)
  Transition of Care [facility]) Screening Note   Patient Details  Name: Suzanne Glenn Date of Birth: [birth year]   Transition of Care [facility]) CM/SW Contact:    Quin Hoop, LCSW Phone Number: 09/19/2022, 2:20 PM    Transition of Care Department [facility]) has reviewed patient and no TOC needs have been identified at this time. We will continue to monitor patient advancement through interdisciplinary progression rounds. If new patient transition needs arise, please place a TOC consult.

## 2022-09-19 NOTE — Evaluation (Signed)
Occupational Therapy Evaluation Patient Details Name: Suzanne Glenn MRN: [id number] DOB: [birth year] Today's Date: 09/19/2022   History of Present Illness 71 yo female presents with acute on chronic hypoxemic respiratory failure. COPD current tobacco use, oxygen supplementation at 4 L nasal cannula, GERD, hyperlipidemia, hypertension   Clinical Impression   Patient presenting with decreased independence in self care, balance, functional mobility/transfers, and endurance. Prior to admission, pt lived with spouse, was Mod I for ADLs, received assistance for IADLs PRN, and Mod I for functional mobility using rollator PRN. During evaluation, pt required set up A for seated LB dressing, Min guard for clothing management in standing, and supervision for simulated toilet transfer. Pt was educated on energy conservation techniques with handout provided explaining the 4 P's (plan, prioritize, pace, position) as well as strategies for how to conserve energy for specific ADL tasks. Pt verbalized understanding. Will continue to address during hospital stay as pt would benefit from further opportunities to practice implementing energy conservation techniques during self-care tasks. Pt on 5L O2 via [place] throughout session and endorsed SOB with activity. Pt will benefit from acute OT to increase overall independence in the areas of ADLs and functional mobility in order to safely discharge home. Pt could benefit from [facility] following D/C to decrease falls risk, improve balance, and maximize independence in self-care within own home environment.     Recommendations for follow up therapy are one component of a multi-disciplinary discharge planning process, led by the attending physician.  Recommendations may be updated based on patient status, additional functional criteria and insurance authorization.   Follow Up Recommendations  Home health OT     Assistance Recommended at Discharge Intermittent  Supervision/Assistance  Patient can return home with the following A little help with walking and/or transfers;A little help with bathing/dressing/bathroom;Assistance with cooking/housework;Assist for transportation;Help with stairs or ramp for entrance    Functional Status Assessment  Patient has had a recent decline in their functional status and demonstrates the ability to make significant improvements in function in a reasonable and predictable amount of time.  Equipment Recommendations  None recommended by OT    Recommendations for Other Services       Precautions / Restrictions Precautions Precautions: Fall Restrictions Weight Bearing Restrictions: No      Mobility Bed Mobility               General bed mobility comments: NT, received/left in recliner    Transfers Overall transfer level: Needs assistance Equipment used: Rolling walker (2 wheels) Transfers: Sit to/from Stand Sit to Stand: Supervision                  Balance Overall balance assessment: Needs assistance Sitting-balance support: Feet supported Sitting balance-Leahy Scale: Good     Standing balance support: Bilateral upper extremity supported Standing balance-Leahy Scale: Good                             ADL either performed or assessed with clinical judgement   ADL Overall ADL's : Needs assistance/impaired                     Lower Body Dressing: Set up;Min guard;Sit to/from stand;Sitting/lateral leans Lower Body Dressing Details (indicate cue type and reason): able to thread BLEs through mesh underwear in sitting with set up A (utilizing figure 4 technique), able to hike over B hips in standing with Min guard Toilet Transfer: Supervision/safety;Rolling walker (2  wheels) Armed forces technical officer Details (indicate cue type and reason): simulated with STS from recliner         Functional mobility during ADLs: Min guard;Rolling walker (2 wheels) (to take several steps at  EOB)       Vision Patient Visual Report: No change from baseline       Perception     Praxis      Pertinent Vitals/Pain Pain Assessment Pain Assessment: No/denies pain     Hand Dominance Right   Extremity/Trunk Assessment Upper Extremity Assessment Upper Extremity Assessment: Overall WFL for tasks assessed   Lower Extremity Assessment Lower Extremity Assessment: Generalized weakness   Cervical / Trunk Assessment Cervical / Trunk Assessment: Normal   Communication Communication Communication: No difficulties   Cognition Arousal/Alertness: Awake/alert Behavior During Therapy: WFL for tasks assessed/performed Overall Cognitive Status: Within Functional Limits for tasks assessed                                       General Comments  SpO2 96% at rest on 5L O2, HR 100s. Pt reporting SOB with activity    Exercises Other Exercises Other Exercises: OT provided education re: role of OT, OT POC, post acute recs, sitting up for all meals, EOB/OOB mobility with assistance, home/fall safety, energy conservation techniques, pursed lip breathing, handout provided   Shoulder Instructions      Home Living Family/patient expects to be discharged to:: Private residence Living Arrangements: Spouse/significant other Available Help at Discharge: Available 24 hours/day Type of Home: Mobile home Home Access: Stairs to enter [employer] of Steps: 17 Entrance Stairs-Rails: Can reach both;Right;Left Home Layout: One level     Bathroom Shower/Tub: Tub/shower unit         Home Equipment: Conservation officer, nature (2 wheels);Shower seat;Rollator (4 wheels);BSC/3in1;Adaptive equipment Adaptive Equipment: Reacher Additional Comments: on 4L O2 at baseline      Prior Functioning/Environment Prior Level of Function : Independent/Modified Independent             Mobility Comments: uses rollator as needed ADLs Comments: Mod I ADL, assist with IADL as needed         OT Problem List: Decreased strength;Decreased activity tolerance;Impaired balance (sitting and/or standing);Cardiopulmonary status limiting activity      OT Treatment/Interventions: Self-care/ADL training;Therapeutic exercise;Therapeutic activities;Energy conservation;DME and/or AE instruction;Patient/family education;Balance training    OT Goals(Current goals can be found in the care plan section) Acute Rehab OT Goals Patient Stated Goal: go home OT Goal Formulation: With patient Time For Goal Achievement: 10/03/22 Potential to Achieve Goals: Good   OT Frequency: Min 2X/week    Co-evaluation              AM-PAC OT "6 Clicks" Daily Activity     Outcome Measure Help from another person eating meals?: None Help from another person taking care of personal grooming?: A Little Help from another person toileting, which includes using toliet, bedpan, or urinal?: A Little Help from another person bathing (including washing, rinsing, drying)?: A Little Help from another person to put on and taking off regular upper body clothing?: None Help from another person to put on and taking off regular lower body clothing?: A Little 6 Click Score: 20   End of Session Equipment Utilized During Treatment: Gait belt;Rolling walker (2 wheels) Nurse Communication: Mobility status  Activity Tolerance: Patient limited by fatigue;Other (comment) (SOB) Patient left: in chair;with call bell/phone within reach;with chair  alarm set  OT Visit Diagnosis: Muscle weakness (generalized) (M62.81);Unsteadiness on feet (R26.81)                Time: 6734-1937 OT Time Calculation (min): 25 min Charges:  OT General Charges $OT Visit: 1 Visit OT Evaluation $OT Eval Low Complexity: 1 Low  [facility] MS, OTR/L ascom [phone]  09/19/22, 2:22 PM

## 2022-09-19 NOTE — Progress Notes (Signed)
Pt to receive HHPT from [place] per [facility].  Will contact at discharge.  [place], [place]

## 2022-09-19 NOTE — Evaluation (Signed)
Physical Therapy Evaluation Patient Details Name: Suzanne Glenn MRN: [id number] DOB: [birth year] Today's Date: 09/19/2022  History of Present Illness  71 yo female presents with acute on chronic hypoxemic respiratory failure. COPD current tobacco use, oxygen supplementation at 4 L nasal cannula, GERD, hyperlipidemia, hypertension  Clinical Impression  Pt found in chair upon PT entry on 5 L O2 via [place]. Sit<>Stand with supervision, RW, and 5 L O2 [place]. Pt ambulated 5 ft with a HR rising to 130s. Pt cued for a seated rest break and was able to walk another 5 ft once HR returned to 120s. HR reached 134bpm with second bout of ambulation. SPO2 remained WNL thorughout session. Pt reported no significant increase in effort. Pt would benefit from skilled physical therapy to address the listed deficits (see below) to increase independence with ADLs and function. Current recommendation is HHPT to return pt to PLOF.          Recommendations for follow up therapy are one component of a multi-disciplinary discharge planning process, led by the attending physician.  Recommendations may be updated based on patient status, additional functional criteria and insurance authorization.  Follow Up Recommendations Home health PT      Assistance Recommended at Discharge Intermittent Supervision/Assistance  Patient can return home with the following  A little help with walking and/or transfers;A little help with bathing/dressing/bathroom;Assist for transportation;Assistance with cooking/housework;Help with stairs or ramp for entrance    Equipment Recommendations None recommended by PT  Recommendations for Other Services       Functional Status Assessment Patient has had a recent decline in their functional status and demonstrates the ability to make significant improvements in function in a reasonable and predictable amount of time.     Precautions / Restrictions Precautions Precautions:  Fall Restrictions Weight Bearing Restrictions: No      Mobility  Bed Mobility                    Transfers Overall transfer level: Needs assistance Equipment used: Rolling walker (2 wheels) Transfers: Sit to/from Stand Sit to Stand: Supervision                Ambulation/Gait   Gait Distance (Feet): 10 Feet (1 seated rest break- limited due to HR) Assistive device: Rolling walker (2 wheels) Gait Pattern/deviations: WFL(Within Functional Limits)       General Gait Details: slow ambulation with no LOB  Stairs            Wheelchair Mobility    Modified Rankin (Stroke Patients Only)       Balance Overall balance assessment: Needs assistance Sitting-balance support: Feet supported Sitting balance-Leahy Scale: Good     Standing balance support: Bilateral upper extremity supported Standing balance-Leahy Scale: Good                               Pertinent Vitals/Pain Pain Assessment Pain Assessment: No/denies pain    Home Living Family/patient expects to be discharged to:: Private residence Living Arrangements: Spouse/significant other Available Help at Discharge: Available 24 hours/day Type of Home: Mobile home Home Access: Stairs to enter Entrance Stairs-Rails: Can reach both;Right;Left Entrance Stairs-Number of Steps: 17   Home Layout: One level Home Equipment: Conservation officer, nature (2 wheels);Shower seat;Rollator (4 wheels);BSC/3in1      Prior Function Prior Level of Function : Independent/Modified Independent             Mobility Comments: uses rollator  as needed       Hand Dominance        Extremity/Trunk Assessment   Upper Extremity Assessment Upper Extremity Assessment: Overall WFL for tasks assessed    Lower Extremity Assessment Lower Extremity Assessment: Generalized weakness    Cervical / Trunk Assessment Cervical / Trunk Assessment: Normal  Communication   Communication: No difficulties  Cognition  Arousal/Alertness: Awake/alert Behavior During Therapy: WFL for tasks assessed/performed Overall Cognitive Status: Within Functional Limits for tasks assessed                                          General Comments      Exercises     Assessment/Plan    PT Assessment Patient needs continued PT services  PT Problem List Decreased strength;Decreased balance;Decreased mobility;Cardiopulmonary status limiting activity;Decreased activity tolerance       PT Treatment Interventions DME instruction;Functional mobility training;Balance training;Patient/family education;Gait training;Neuromuscular re-education;Therapeutic activities;Stair training;Therapeutic exercise    PT Goals (Current goals can be found in the Care Plan section)  Acute Rehab PT Goals Patient Stated Goal: to return home PT Goal Formulation: With patient Time For Goal Achievement: 10/03/22 Potential to Achieve Goals: Good    Frequency Min 2X/week     Co-evaluation               AM-PAC PT "6 Clicks" Mobility  Outcome Measure Help needed turning from your back to your side while in a flat bed without using bedrails?: A Little Help needed moving from lying on your back to sitting on the side of a flat bed without using bedrails?: A Little Help needed moving to and from a bed to a chair (including a wheelchair)?: A Little Help needed standing up from a chair using your arms (e.g., wheelchair or bedside chair)?: A Little Help needed to walk in hospital room?: A Little Help needed climbing 3-5 steps with a railing? : A Lot 6 Click Score: 17    End of Session   Activity Tolerance: Treatment limited secondary to medical complications (Comment);Other (comment) (HR 134bpm) Patient left: in chair;with call bell/phone within reach Nurse Communication: Other (comment) (nurse tech communicated HR status) PT Visit Diagnosis: Unsteadiness on feet (R26.81);Other abnormalities of gait and mobility  (R26.89);Muscle weakness (generalized) (M62.81);Difficulty in walking, not elsewhere classified (R26.2);Other symptoms and signs involving the nervous system (R29.898)    Time: 0947-0962 PT Time Calculation (min) (ACUTE ONLY): 25 min   Charges:              Claiborne Billings O'Daniel, SPT  09/19/2022, 12:04 PM

## 2022-09-19 NOTE — Inpatient Diabetes Management (Signed)
Inpatient Diabetes Program Recommendations  AACE/ADA: New Consensus Statement on Inpatient Glycemic Control   Target Ranges:  Prepandial:   less than 140 mg/dL      Peak postprandial:   less than 180 mg/dL (1-2 hours)      Critically ill patients:  140 - 180 mg/dL    Latest Reference Range & Units 09/18/22 08:21 09/18/22 12:07 09/18/22 17:36 09/18/22 19:31 09/19/22 08:08  Glucose-Capillary 70 - 99 mg/dL 113 (H) 303 (H) 253 (H) 295 (H) 107 (H)  (H): Data is abnormally high  Review of Glycemic Control  Diabetes history: DM2 Outpatient Diabetes medications: Metformin 500 mg BID Current orders for Inpatient glycemic control: Novolog 0-15 units TID with meals, Novolog 0-5 units QHS; Prednisone 40 mg QAM; Ensure Enlive 237 ml BID  Inpatient Diabetes Program Recommendations:    Insulin: If steroids are continued, please consider ordering Novolog 4 units TID with meals for meal coverage if patient eats at least 50% of meals.  Thanks,  Barnie Alderman, RN, MSN, [place] Diabetes Coordinator Inpatient Diabetes Program [phone] (Team Pager from 8am to [place])

## 2022-09-19 NOTE — Progress Notes (Signed)
PULMONOLOGY         Date: 09/19/2022,   MRN# [id number] Suzanne Glenn [birth year]     AdmissionWeight: 83.9 kg                 CurrentWeight: 84.7 kg  Referring provider: Dr Sheppard Coil   CHIEF COMPLAINT:   Acute on chronic hypoxemic respiratory failure   HISTORY OF PRESENT ILLNESS   This is a pleasant 71 year old female with a history of anxiety disorder with asthma, right upper lobe lung cancer, advanced COPD with centrilobular emphysema and chronic hypoxemia on supplemental oxygen at rest, coronary artery disease, major depressive disorder, diabetes type 2, gastroesophageal reflux disease, daily migraine headaches hypertension, dyslipidemia neuropathy and restless leg syndrome, who came in with respiratory distress however did not have severe hypoxemia and vital signs were essentially normal besides mild tachypnea she had blood work done which showed normal renal function mildly elevated blood glucose mild leukocytosis with heme with hemoglobin being 9.1 and WBC count being 12.3 she received Solu-Medrol x 1 nebulizer treatment in the ED followed by empiric Rocephin and Zithromax for possible.  She reports being around a young grandchild who possibly was sick around Thanksgiving.  She is a lifelong smoker and currently continues to smoke.  She had CT chest performed which I personally reviewed, showing a consolidated infiltrate of the right lower lobe with associated mild to moderate pleural effusion.   09/19/22-patient is on 5L/min this am, she reports improvement.  Still requires assistance to get OOB. Inflammatory workup is negative, microbiology including 20 path RVP is negative, fluid balance is net 2.7L diuresed.  I have reduced prednisone 52m and she has multiple antibiotic allergies but once we discussed this she said she can take any antibiotics.  I have started zithromax 250 po daily.  She feels well enough to ask if she can go home today.  Will discuss with attending  physician if possible to initiate dc planning.   PAST MEDICAL HISTORY   Past Medical History:  Diagnosis Date   Anxiety    Asthma    [place]   w/u for right upper lobe mass/cancer   COPD (chronic obstructive pulmonary disease) (HCC)    also, emphysema. now using o2 via np 24 hours a day   Coronary artery disease    Depression    Diabetes mellitus, type II (HCC)    GERD (gastroesophageal reflux disease)    Headache    migraines in early 20's   Heart murmur    HLD (hyperlipidemia)    Hypertension    [place]ung cancer [place]    Neuropathy    Restless leg      SURGICAL HISTORY   Past Surgical History:  Procedure Laterality Date   BACK SURGERY  2005   surgery x 2, disc fused in neck, pinched nerve in center of back and neck   CARDIAC CATHETERIZATION  2015   1 stent placed for blockage   cervical bone infusion     CHOLECYSTECTOMY     DILATION AND CURETTAGE OF UTERUS     FOOT SURGERY Left    foot bone spur removed   THORACIC DISC SURGERY     TUBAL LIGATION     VIDEO BRONCHOSCOPY WITH ENDOBRONCHIAL ULTRASOUND N/A 03/06/2019   Procedure: VIDEO BRONCHOSCOPY WITH ENDOBRONCHIAL ULTRASOUND - DIABETIC;  Surgeon: AOttie Glazier MD;  Location: [facility] ORS;  Service: Thoracic;  Laterality: N/A;     FAMILY HISTORY   Family History  Problem  Relation Age of Onset   Anxiety disorder Mother    Cancer - Lung Mother    Depression Sister    Cancer Sister    Cancer Sister    Cancer Sister    Cancer Sister    Heart Problems Sister    Bipolar disorder Sister    Diabetes Sister    Cancer - Lung Sister    Hypertension Sister    Diabetes Sister    Hyperlipidemia Sister    COPD Sister    Depression Brother    Heart Problems Brother    Cancer Brother    Cancer Brother    Cancer Brother    Breast cancer Maternal Aunt    Depression Son      SOCIAL HISTORY   Social History   Tobacco Use   Smoking status: Every Day    Packs/day: 1.00    Years: 40.00    Total pack  years: 40.00    Types: Cigarettes    Start date: 05/05/1975   Smokeless tobacco: Never  Vaping Use   Vaping Use: Never used  Substance Use Topics   Alcohol use: No    Alcohol/week: 0.0 standard drinks of alcohol    Comment: socially   Drug use: No     MEDICATIONS    Home Medication:    Current Medication:  Current Facility-Administered Medications:    ALPRAZolam (XANAX) tablet 0.5 mg, 0.5 mg, Oral, BID PRN, Emeterio Reeve, DO, 0.5 mg at 09/15/22 1737   aspirin chewable tablet 81 mg, 81 mg, Oral, Daily, Cox, Amy N, DO, 81 mg at 09/18/22 0904   carbidopa-levodopa (SINEMET IR) 25-100 MG per tablet immediate release 1 tablet, 1 tablet, Oral, TID, Cox, Amy N, DO, 1 tablet at 09/18/22 2106   enoxaparin (LOVENOX) injection 42.5 mg, 0.5 mg/kg, Subcutaneous, Q24H, Cox, Amy N, DO, 42.5 mg at 09/18/22 2107   feeding supplement (ENSURE ENLIVE / ENSURE PLUS) liquid 237 mL, 237 mL, Oral, BID BM, Cox, Amy N, DO, 237 mL at 09/18/22 1334   fluticasone furoate-vilanterol (BREO ELLIPTA) 100-25 MCG/ACT 1 puff, 1 puff, Inhalation, Daily, 1 puff at 09/18/22 0906 **AND** umeclidinium bromide (INCRUSE ELLIPTA) 62.5 MCG/ACT 1 puff, 1 puff, Inhalation, Daily, Cox, Amy N, DO, 1 puff at 09/18/22 0906   gabapentin (NEURONTIN) capsule 300 mg, 300 mg, Oral, TID, Cox, Amy N, DO, 300 mg at 09/18/22 2106   hydrALAZINE (APRESOLINE) injection 10 mg, 10 mg, Intravenous, Q8H PRN, Emeterio Reeve, DO   influenza vaccine adjuvanted (FLUAD) injection 0.5 mL, 0.5 mL, Intramuscular, Tomorrow-1000, Alexander, Natalie, DO   insulin aspart (novoLOG) injection 0-15 Units, 0-15 Units, Subcutaneous, TID WC, Cox, Amy N, DO, 8 Units at 09/18/22 1756   insulin aspart (novoLOG) injection 0-5 Units, 0-5 Units, Subcutaneous, QHS, Cox, Amy N, DO, 3 Units at 09/18/22 2107   ipratropium-albuterol (DUONEB) 0.5-2.5 (3) MG/3ML nebulizer solution 3 mL, 3 mL, Nebulization, Q4H PRN, Sharion Settler, NP   ipratropium-albuterol (DUONEB)  0.5-2.5 (3) MG/3ML nebulizer solution 3 mL, 3 mL, Nebulization, Q6H, Emeterio Reeve, DO, 3 mL at 09/19/22 0224   isosorbide mononitrate (IMDUR) 24 hr tablet 30 mg, 30 mg, Oral, Daily, Cox, Amy N, DO, 30 mg at 09/18/22 0904   metoprolol tartrate (LOPRESSOR) injection 5 mg, 5 mg, Intravenous, Q8H, Alexander, Lanelle Bal, DO, 5 mg at 09/19/22 0540   nicotine (NICODERM CQ - dosed in mg/24 hours) patch 14 mg, 14 mg, Transdermal, Daily PRN, Cox, Amy N, DO, 14 mg at 09/13/22 2137   Oral care mouth  rinse, 15 mL, Mouth Rinse, PRN, Emeterio Reeve, DO   pantoprazole (PROTONIX) EC tablet 80 mg, 80 mg, Oral, Q1200, Cox, Amy N, DO, 80 mg at 09/18/22 1220   pneumococcal 20-valent conjugate vaccine (PREVNAR 20) injection 0.5 mL, 0.5 mL, Intramuscular, Tomorrow-1000, Alexander, Natalie, DO   pravastatin (PRAVACHOL) tablet 80 mg, 80 mg, Oral, QPM, Cox, Amy N, DO, 80 mg at 09/18/22 1756   senna-docusate (Senokot-S) tablet 1 tablet, 1 tablet, Oral, QHS PRN, Cox, Amy N, DO   spironolactone (ALDACTONE) tablet 25 mg, 25 mg, Oral, Daily, Cox, Amy N, DO, 25 mg at 09/18/22 7510   torsemide (DEMADEX) tablet 20 mg, 20 mg, Oral, Daily, Lanney Gins, Huston Stonehocker, MD, 20 mg at 09/18/22 0904   venlafaxine XR (EFFEXOR-XR) 24 hr capsule 150 mg, 150 mg, Oral, Daily, Cox, Amy N, DO, 150 mg at 09/18/22 2585   venlafaxine XR (EFFEXOR-XR) 24 hr capsule 75 mg, 75 mg, Oral, Daily, Cox, Amy N, DO, 75 mg at 09/18/22 0903    ALLERGIES   Diclofenac-misoprostol, Nsaids, Zolpidem, Hydrocodone-acetaminophen, and Simvastatin     REVIEW OF SYSTEMS    Review of Systems:  Gen:  Denies  fever, sweats, chills weigh loss  HEENT: Denies blurred vision, double vision, ear pain, eye pain, hearing loss, nose bleeds, sore throat Cardiac:  No dizziness, chest pain or heaviness, chest tightness,edema Resp:   reports dyspnea chronically  Gi: Denies swallowing difficulty, stomach pain, nausea or vomiting, diarrhea, constipation, bowel incontinence Gu:   Denies bladder incontinence, burning urine Ext:   Denies Joint pain, stiffness or swelling Skin: Denies  skin rash, easy bruising or bleeding or hives Endoc:  Denies polyuria, polydipsia , polyphagia or weight change Psych:   Denies depression, insomnia or hallucinations   Other:  All other systems negative   VS: BP (!) 143/90 (BP Location: Left Arm)   Pulse (!) 104   Temp 98.5 F (36.9 C) (Oral)   Resp 16   Ht _0  (1.6 m)   Wt 84.7 kg   SpO2 98%   BMI 33.07 kg/m      PHYSICAL EXAM    GENERAL:NAD, no fevers, chills, no weakness no fatigue HEAD: Normocephalic, atraumatic.  EYES: Pupils equal, round, reactive to light. Extraocular muscles intact. No scleral icterus.  MOUTH: Moist mucosal membrane. Dentition intact. No abscess noted.  EAR, NOSE, THROAT: Clear without exudates. No external lesions.  NECK: Supple. No thyromegaly. No nodules. No JVD.  PULMONARY: decreased breath sounds with mild rhonchi worse at bases bilaterally.  CARDIOVASCULAR: S1 and S2. Regular rate and rhythm. No murmurs, rubs, or gallops. No edema. Pedal pulses 2+ bilaterally.  GASTROINTESTINAL: Soft, nontender, nondistended. No masses. Positive bowel sounds. No hepatosplenomegaly.  MUSCULOSKELETAL: No swelling, clubbing, or edema. Range of motion full in all extremities.  NEUROLOGIC: Cranial nerves II through XII are intact. No gross focal neurological deficits. Sensation intact. Reflexes intact.  SKIN: No ulceration, lesions, rashes, or cyanosis. Skin warm and dry. Turgor intact.  PSYCHIATRIC: Mood, affect within normal limits. The patient is awake, alert and oriented x 3. Insight, judgment intact.       IMAGING     ASSESSMENT/PLAN   Acute hypoxemic respiratory failure - present on admission  - COVID19 -negative   - supplemental O2 during my evaluation -4L/min - suspect this is likely viral LRTI acute exacerbation with atelectasis -serum fungitell -legionella ab -strep pneumoniae ur  AG -Histoplasma Ur Ag -sputum resp cultures -AFB sputum expectorated specimen -sputum cytology  -reviewed pertinent imaging with patient today -  ESR/CRP -PT/OT for d/c planning  -please encourage patient to use incentive spirometer few times each hour while hospitalized.       Right moderate pleural effusion           - noted patient on aldactone , will add torsemide - allergy is not clinically significant    -dc amlodipine for now to avoid transient hypotension     -patient diuresed 2.7lbs overnight - 09/19/22  Compressiive atelectasis    - need more aggressive bph with metaneb qid        Thank you for allowing me to participate in the care of this patient.   Patient/Family are satisfied with care plan and all questions have been answered.    Provider disclosure: Patient with at least one acute or chronic illness or injury that poses a threat to life or bodily function and is being managed actively during this encounter.  All of the below services have been performed independently by signing provider:  review of prior documentation from internal and or external health records.  Review of previous and current lab results.  Interview and comprehensive assessment during patient visit today. Review of current and previous chest radiographs/CT scans. Discussion of management and test interpretation with health care team and patient/family.   This document was prepared using Dragon voice recognition software and may include unintentional dictation errors.     Ottie Glazier, M.D.  Division of Pulmonary & Critical Care Medicine

## 2022-09-19 NOTE — Progress Notes (Signed)
[facility] Progress Note  Patient: Suzanne Glenn    KAJ:[id number]  DOA: 09/13/2022     Date of Service: the patient was seen and examined on 09/19/2022  Chief Complaint  Patient presents with   Shortness of Breath   Brief hospital course: [name] is a 71 year old female with history of COPD current tobacco use, oxygen supplementation at 4 L nasal cannula, GERD, hyperlipidemia, hypertension, who presents to emergency department for chief concerns of shortness of breath. 11/30: afebrile, tachypneic to 29, HR and BP WNL. Mild hyperglycemia, no other concerns on CMP. BNP 770. Procalcitonin <0.10. WBC 12.3, Hgb 9.1. Neg COVID. Per ED documentation, EMS gave patient 1 breathing treatment and gave patient 1 dose of Solu-Medrol 125 mg IV one-time dose. CXR: "Patchy hazy opacity in the peripheral left mid lung, decreased, favor improving pneumonia superimposed on chronic lingular scarring as seen on 07/04/2022 chest CT angiogram study. Similar small left pleural effusion, possibly loculated. Repeat CXR advised in 2-3 months or earlier as clinically warranted." ED treatment: DuoNebs x3, ceftriaxone 2 g one-time dose, azithromycin 500 mg IV one-time dose. Admitted to hpspitalist service.  12/01: overnight/early AM, requiring more O2 and needing BiPap. She is alert on rounds, she is not comfortable w/ the BiPap mask but husband is encouraging her to continue to wear it. Plan CT chest given deterioration and concern for possible loculated effusion on XR. Reexamined in ED later in afternoon - pt reports feeling a bit better. Tachycardic and hypertensive - IV meds while npo on bipap, still tachypneic but SpO2 okay. Lung sounds same. CT chest no PE, (+)new right upper lobe airspace consolidation worrisome for pneumonia.  New patchy and ill-defined nodular densities in the bilateral upper lobes favored as infectious/inflammatory. 12/02: Off BiPap today while awake, still on 6L [place]. Continue  current treatment.  12/03-12/04: still needing BiPap overnight. On 6L HFNC this morning and into the afternoon. Will ask pulmonary team to evaluate.  12/05: Pulmonology saw pt - Dr Lanney Gins recs: suspect this is likely viral LRTI acute exacerbation with atelectasis. Obtain serum fungitell, legionella ab, strep pneumoniae ur AG, Histoplasma Ur Ag, sputum resp cultures, AFB sputum expectorated specimen, sputum cytology, ESR/CRP. Encourage patient to use incentive spirometer few times each hour while hospitalized.  Add torsemide - allergy is not clinically significant. D/c amlodipine for now to avoid transient hypotension. Add metaneb qid for compressive atelectasis. Needing up to 7L HFNC this afternoon.        Consultants:  Pulmonary   Procedures: None   Assessment and Plan: Principal Problem:   COPD exacerbation ([place]) Active Problems:   Essential hypertension   Type 2 diabetes mellitus with hyperlipidemia ([facility])   Tobacco use   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HCC)   MDD (major depressive disorder), recurrent, in full remission ([place])   HLD (hyperlipidemia)   RLS (restless legs syndrome)   COPD exacerbation ([place]) Presumed secondary to continued tobacco use, complicated by recent exposure to a young child w/ respiratory illness Off BiPap during the day, still needing 5L [place] while awake  DuoNebs Solu-Medrol 40 mg IV twice daily, 2 doses ordered for 09/14/2022 and will transition to po after that  Procalcitonin was negative, but given worsening and CT concerning for PNA will continue abx w/ azithro/ceftriax slow to improve, may need BiPap at home -  pulmonary consulted Pulmonary recs: suspect this is likely viral LRTI acute exacerbation with atelectasis.  Obtain serum fungitell, legionella ab, strep pneumoniae ur AG, Histoplasma Ur Ag,  sputum resp cultures, AFB sputum, expectorated specimen, sputum cytology, ESR/CRP.  Encourage patient to use incentive spirometer few times each hour  while hospitalized.   Add torsemide - allergy is not clinically significant. D/c amlodipine for now to avoid transient hypotension.  Add metaneb qid for compressive atelectasis.     Essential hypertension Amlodipine 10 mg daily, metoprolol tartrate 25 mg p.o. twice daily, spironolactone 25 mg daily, isosorbide mononitrate 30 mg daily resumed Multiple PVCs and trigeminy noticed on the monitor as per RN Continue Lopressor Potassium and magnesium within normal range  HLD (hyperlipidemia) Pravastatin 80 mg q. evening resumed   MDD (major depressive disorder), recurrent, in full remission (HCC) Venlafaxine 225 mg daily resumed   Tobacco use Nicotine as needed Patch ordered   Type 2 diabetes mellitus with hyperlipidemia ([place]) Home metformin not resumed on admission Insulin SSI with at bedtime coverage ordered Goal inpatient blood glucose level is 140-180    Iron deficiency anemia, transferrin saturation 6% Started oral supplement B12 and folate within normal range   Vitamin D insufficiency, vit D level 21, started oral supplement 50,000 units p.o. weekly.  Body mass index is 33.07 kg/m.  Interventions:       Diet: Heart healthy diet DVT Prophylaxis: Subcutaneous Lovenox   Advance goals of care discussion: Full code  Family Communication: family NOT was present at bedside, at the time of interview.  The pt provided permission to discuss medical plan with the family. Opportunity was given to ask question and all questions were answered satisfactorily.   Disposition:  Pt is from Home, admitted with acute on chronic respiratory failure, still has respiratory failure requiring 5 L oxygen via nasal cannula, which precludes a safe discharge. Discharge to Home, when clinically stable, may need 1-2 more days.  Subjective: No significant events overnight, patient is still having significant shortness of breath, requiring 5 L oxygen via nasal cannula. Patient was noticed to have  trigeminy PVCs on the monitor, so we will continue to monitor today and plan for disposition tomorrow a.m.   Physical Exam: General:  alert oriented to time, place, and person.  Appear in mild distress, affect appropriate Eyes: PERRLA ENT: Oral Mucosa Clear, moist  Neck: no JVD,  Cardiovascular: S1 and S2 Present, no Murmur,  Respiratory: increased respiratory effort, Bilateral Air entry equal and Decreased, mild Crackles, significant wheezes bilaterally Abdomen: Bowel Sound present, Soft and no tenderness,  Skin: no rashes Extremities: no Pedal edema, no calf tenderness Neurologic: without any new focal findings Gait not checked due to patient safety concerns  Vitals:   09/19/22 0834 09/19/22 1139 09/19/22 1408 09/19/22 1531  BP:  (!) 150/114  119/71  Pulse: 97 (!) 115 82 95  Resp: _0 Temp:  98.1 F (36.7 C)  99 F (37.2 C)  TempSrc:  Oral  Oral  SpO2: 98% 95% 95% 96%  Weight:      Height:        Intake/Output Summary (Last 24 hours) at 09/19/2022 1613 Last data filed at 09/19/2022 1440 Gross per 24 hour  Intake 960 ml  Output 2200 ml  Net -1240 ml   Filed Weights   09/13/22 1121 09/17/22 0431 09/18/22 0532  Weight: 83.9 kg 84.5 kg 84.7 kg    Data Reviewed: I have personally reviewed and interpreted daily labs, tele strips, imagings as discussed above. I reviewed all nursing notes, pharmacy notes, vitals, pertinent old records I have discussed plan of care as described above with RN and  patient/family.  CBC: Recent Labs  Lab 09/13/22 1126 09/14/22 0645 09/15/22 0526 09/17/22 0502 09/18/22 0553 09/19/22 0823  WBC 12.3* 12.5* 10.3 10.3 9.8 9.8  NEUTROABS 10.7*  --   --   --   --   --   HGB 9.1* 8.3* 8.5* 8.0* 8.1* 8.8*  HCT 31.6* 28.5* 28.9* 28.7* 28.2* 30.8*  MCV 96.9 95.0 94.8 99.0 96.9 95.1  PLT 332 349 317 281 286 579   Basic Metabolic Panel: Recent Labs  Lab 09/14/22 0645 09/15/22 0526 09/17/22 0502 09/18/22 0553 09/19/22 0823  NA  140 141 143 141 140  K 4.9 4.4 4.1 4.1 4.1  CL 88* 89* 91* 90* 87*  CO2 43* 42* >45* 44* >45*  GLUCOSE 146* 147* 111* 113* 113*  BUN 21 27* 33* 23 27*  CREATININE 0.54 0.57 0.53 0.40* 0.61  CALCIUM 9.3 9.2 9.0 8.8* 9.2  MG  --   --   --   --  1.9  PHOS  --   --   --   --  3.9    Studies: No results found.  Scheduled Meds:  aspirin  81 mg Oral Daily   azithromycin  250 mg Oral Daily   carbidopa-levodopa  1 tablet Oral TID   enoxaparin (LOVENOX) injection  0.5 mg/kg Subcutaneous Q24H   feeding supplement  237 mL Oral BID BM   fluticasone furoate-vilanterol  1 puff Inhalation Daily   And   umeclidinium bromide  1 puff Inhalation Daily   gabapentin  300 mg Oral TID   guaiFENesin  600 mg Oral BID   influenza vaccine adjuvanted  0.5 mL Intramuscular Tomorrow-1000   insulin aspart  0-15 Units Subcutaneous TID WC   insulin aspart  0-5 Units Subcutaneous QHS   ipratropium-albuterol  3 mL Nebulization TID   isosorbide mononitrate  30 mg Oral Daily   metoprolol tartrate  25 mg Oral BID   pantoprazole  80 mg Oral Q1200   pneumococcal 20-valent conjugate vaccine  0.5 mL Intramuscular Tomorrow-1000   pravastatin  80 mg Oral QPM   predniSONE  40 mg Oral Q breakfast   spironolactone  25 mg Oral Daily   torsemide  20 mg Oral Daily   venlafaxine XR  150 mg Oral Daily   venlafaxine XR  75 mg Oral Daily   Continuous Infusions: PRN Meds: albuterol, ALPRAZolam, chlorpheniramine-HYDROcodone, hydrALAZINE, nicotine, mouth rinse, senna-docusate  Time spent: 35 minutes  Author: Val Riles. MD [facility] 09/19/2022 4:13 PM  To reach On-call, see care teams to locate the attending and reach out to them via [email or web address]. If 7PM-7AM, please contact night-coverage If you still have difficulty reaching the attending provider, please page the [facility] (Director on Call) for [facility] on amion for assistance.

## 2022-09-20 DIAGNOSIS — J441 Chronic obstructive pulmonary disease with (acute) exacerbation: Secondary | ICD-10-CM | POA: Diagnosis not present

## 2022-09-20 LAB — CBC
HCT: 29.3 % — ABNORMAL LOW (ref 36.0–46.0)
Hemoglobin: 8.7 g/dL — ABNORMAL LOW (ref 12.0–15.0)
MCH: 27.4 pg (ref 26.0–34.0)
MCHC: 29.7 g/dL — ABNORMAL LOW (ref 30.0–36.0)
MCV: 92.4 fL (ref 80.0–100.0)
Platelets: 269 10*3/uL (ref 150–400)
RBC: 3.17 MIL/uL — ABNORMAL LOW (ref 3.87–5.11)
RDW: 14.9 % (ref 11.5–15.5)
WBC: 12.7 10*3/uL — ABNORMAL HIGH (ref 4.0–10.5)
nRBC: 0.2 % (ref 0.0–0.2)

## 2022-09-20 LAB — BASIC METABOLIC PANEL
BUN: 25 mg/dL — ABNORMAL HIGH (ref 8–23)
CO2: >45 mmol/L — ABNORMAL HIGH (ref 22–32)
Calcium: 9 mg/dL (ref 8.9–10.3)
Chloride: 86 mmol/L — ABNORMAL LOW (ref 98–111)
Creatinine, Ser: 0.59 mg/dL (ref 0.44–1.00)
GFR, Estimated: >60 mL/min (ref >60)
Glucose, Bld: 99 mg/dL (ref 70–99)
Potassium: 3.9 mmol/L (ref 3.5–5.1)
Sodium: 139 mmol/L (ref 135–145)

## 2022-09-20 LAB — BLOOD GAS, VENOUS
Acid-Base Excess: 28.8 mmol/L — ABNORMAL HIGH (ref 0.0–2.0)
Bicarbonate: 57.9 mmol/L — ABNORMAL HIGH (ref 20.0–28.0)
O2 Saturation: 87.3 %
Patient temperature: 37
pCO2, Ven: 76 mmHg (ref 44–60)
pH, Ven: 7.49 — ABNORMAL HIGH (ref 7.25–7.43)
pO2, Ven: 55 mmHg — ABNORMAL HIGH (ref 32–45)

## 2022-09-20 LAB — MAGNESIUM: Magnesium: 2.1 mg/dL (ref 1.7–2.4)

## 2022-09-20 LAB — GLUCOSE, CAPILLARY
Glucose-Capillary: 100 mg/dL — ABNORMAL HIGH (ref 70–99)
Glucose-Capillary: 232 mg/dL — ABNORMAL HIGH (ref 70–99)

## 2022-09-20 LAB — PHOSPHORUS: Phosphorus: 3.2 mg/dL (ref 2.5–4.6)

## 2022-09-20 MED ORDER — PREDNISONE 20 MG PO TABS: ORAL_TABLET | ORAL | 0 refills | Status: AC

## 2022-09-20 MED ORDER — VITAMIN D (ERGOCALCIFEROL) 1.25 MG (50000 UNIT) PO CAPS: 50000.0000 [IU] | ORAL_CAPSULE | ORAL | 0 refills | Status: AC

## 2022-09-20 MED ORDER — POLYSACCHARIDE IRON COMPLEX 150 MG PO CAPS: 150.0000 mg | ORAL_CAPSULE | Freq: Every day | ORAL | 0 refills | Status: AC

## 2022-09-20 MED ORDER — METFORMIN HCL 500 MG PO TABS: 500.0000 mg | ORAL_TABLET | Freq: Two times a day (BID) | ORAL | 1 refills | Status: AC

## 2022-09-20 MED ORDER — METOPROLOL TARTRATE 50 MG PO TABS: 50.0000 mg | ORAL_TABLET | Freq: Two times a day (BID) | ORAL | 2 refills | Status: AC

## 2022-09-20 MED ORDER — AMLODIPINE BESYLATE 5 MG PO TABS: 5.0000 mg | ORAL_TABLET | Freq: Every day | ORAL | 2 refills | Status: AC

## 2022-09-20 MED ORDER — AZITHROMYCIN 250 MG PO TABS: 250.0000 mg | ORAL_TABLET | Freq: Every day | ORAL | 0 refills | Status: AC

## 2022-09-20 MED ORDER — TORSEMIDE 20 MG PO TABS: 20.0000 mg | ORAL_TABLET | Freq: Every day | ORAL | 0 refills | Status: AC

## 2022-09-20 NOTE — Progress Notes (Signed)
DC instructions and med list reviewed with pt and SO.  Both verbalized understanding,  Tele and IV removed.  Pt to f/u with PCP and pulmonary.  Tele and IV removed.  Patient discharged with NT via wheelchair with home 02 tank without incident

## 2022-09-20 NOTE — Discharge Summary (Signed)
[facility] Discharge Summary   Patient: Suzanne Glenn:[id number]  PCP: Tracie Harrier, MD  Date of admission: 09/13/2022   Date of discharge:  09/20/2022     Discharge Diagnoses:  Principal Problem:   COPD exacerbation ([place]) Active Problems:   Essential hypertension   Type 2 diabetes mellitus with hyperlipidemia ([place])   Tobacco use   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HCC)   MDD (major depressive disorder), recurrent, in full remission ([place])   HLD (hyperlipidemia)   RLS (restless legs syndrome)   Admitted From: Home Disposition:  Home with HHPT  Recommendations for Outpatient Follow-up:  PCP:  in 1 week.  Monitor BP and follow with PCP to titrate medications accordingly.  Decreased amlodipine from 10 to 5 mg and increase Lopressor from 25 to 50 mg p.o. daily due to PVCs. Follow-up with pulmonologist in 1 week Follow up LABS/TEST:     Diet recommendation: Carb modified diet  Activity: The patient is advised to gradually reintroduce usual activities, as tolerated  Discharge Condition: stable  Code Status: Full code   History of present illness: As per the H and P dictated on admission Hospital Course:  Ms. Reilyn Nelson is a 71 year old female with history of COPD current tobacco use, oxygen supplementation at 4 L nasal cannula, GERD, hyperlipidemia, hypertension, who presents to emergency department for chief concerns of shortness of breath. ED w/up afebrile, tachypneic to 29, HR and BP WNL. Mild hyperglycemia, no other concerns on CMP. BNP 770. Procalcitonin <0.10. WBC 12.3, Hgb 9.1. Neg COVID. Per ED documentation, EMS gave patient 1 breathing treatment and gave patient 1 dose of Solu-Medrol 125 mg IV one-time dose. CXR: "Patchy hazy opacity in the peripheral left mid lung, decreased, favor improving pneumonia superimposed on chronic lingular scarring as seen on 07/04/2022 chest CT angiogram study. Similar small left pleural effusion, possibly loculated.  Repeat CXR advised in 2-3 months or earlier as clinically warranted." ED treatment: DuoNebs x3, ceftriaxone 2 g one-time dose, azithromycin 500 mg IV one-time dose. Admitted to hpspitalist service.   12/01: overnight/early AM, requiring more O2 and needing BiPap. She is alert on rounds, she is not comfortable w/ the BiPap mask but husband is encouraging her to continue to wear it. Plan CT chest given deterioration and concern for possible loculated effusion on XR. Reexamined in ED later in afternoon - pt reports feeling a bit better. Tachycardic and hypertensive - IV meds while npo on bipap, still tachypneic but SpO2 okay. Lung sounds same. 12/02: Off BiPap today while awake, still on 6L [place]. Continue current treatment.  12/03-12/04: still needing BiPap overnight. On 6L HFNC this morning and into the afternoon. Will ask pulmonary team to evaluate.  12/05: Pulmonology saw pt - Dr Lanney Gins recs: suspect this is likely viral LRTI acute exacerbation with atelectasis. Obtain serum fungitell, legionella ab, strep pneumoniae ur AG, Histoplasma Ur Ag, sputum resp cultures, AFB sputum expectorated specimen, sputum cytology, ESR/CRP. Encourage patient to use incentive spirometer few times each hour while hospitalized.  Add torsemide - allergy is not clinically significant. D/c amlodipine for now to avoid transient hypotension. Add metaneb qid for compressive atelectasis. Needing up to 7L HFNC this afternoon.   COPD exacerbation ([employer]) Presumed secondary to continued tobacco use, complicated by recent exposure to a young child w/ respiratory illness. CT chest no PE, (+)new right upper lobe airspace consolidation worrisome for pneumonia.  New patchy and ill-defined nodular densities in the bilateral upper lobes favored as infectious/inflammatory. S/p BiPAP, respiratory status  improved, patient is back to her baseline.  Her oxygen.  Patient was given IV Solu-Medrol, transition to oral tapering dose prednisone.  Patient  was seen by pulmonologist, patient seems to be stable and cleared for discharge, recommended to follow with pulmonologist as an outpatient.  Resumed inhalers. # Essential hypertension During hospital stay patient was having some PVCs on the monitor so increased Lopressor from 25 to 50 mg p.o. twice daily, decrease amlodipine from 10 to 5 mg p.o. daily, continued Imdur and spironolactone home dose.  Patient was advised to monitor BP at home and follow with PCP for further management. # HLD (hyperlipidemia), Pravastatin 80 mg q. evening resumed # MDD (major depressive disorder), recurrent, in full remission (HCC) Venlafaxine 225 mg daily resumed # Tobacco use, smoking cessation counseling done. S/p nicotine patch.   # Type 2 diabetes mellitus with hyperlipidemia  Home medications were held during hospital stay, resumed on discharge.  Patient was advised to monitor FSBG, continue diabetic diet.  Patient remains at high risk for hypoglycemia due to steroids. # Iron deficiency anemia, transferrin saturation 6%, Started oral supplement B12 and folate within normal range # Vitamin D insufficiency, vit D level 21, started oral supplement 50,000 units p.o. weekly. Body mass index is 33.07 kg/m.  Nutrition Interventions:   Patient was seen by physical therapy, who recommended Home health, which was arranged. On the day of the discharge the patient's vitals were stable, and no other acute medical condition were reported by patient. the patient was felt safe to be discharge at Home with Home health.  Consultants: Pulmonary Procedures: None  Discharge Exam: General: Appear in mild distress, no Rash; Oral Mucosa Clear, moist. Cardiovascular: S1 and S2 Present, no Murmur, Respiratory: increased respiratory effort as base line, Bilateral Air entry present and no Crackles, mild wheezes Abdomen: Bowel Sound present, Soft and no tenderness, no hernia Extremities: no Pedal edema, no calf  tenderness Neurology: alert and oriented to time, place, and person affect appropriate.  Filed Weights   09/13/22 1121 09/17/22 0431 09/18/22 0532  Weight: 83.9 kg 84.5 kg 84.7 kg   Vitals:   09/20/22 0846 09/20/22 1215  BP: (!) 150/82 124/71  Pulse: 97 70  Resp: 18 19  Temp: 99.1 F (37.3 C) 98.3 F (36.8 C)  SpO2: 98% 92%    DISCHARGE MEDICATION: Allergies as of 09/20/2022       Reactions   Diclofenac-misoprostol Other (See Comments)   Arthrotec - gastritis    Nsaids Other (See Comments)   gastritis   Zolpidem Other (See Comments)   Sleep walking    Hydrocodone-acetaminophen Nausea And Vomiting   Simvastatin Other (See Comments)   body aches        Medication List     STOP taking these medications    ferrous sulfate 325 (65 FE) MG tablet       TAKE these medications    Accu-Chek FastClix Lancets Misc   Accu-Chek SmartView test strip Generic drug: glucose blood   albuterol 108 (90 Base) MCG/ACT inhaler Commonly known as: VENTOLIN HFA Inhale 2 puffs into the lungs every 4 (four) hours as needed for wheezing or shortness of breath.   amLODipine 5 MG tablet Commonly known as: NORVASC Take 1 tablet (5 mg total) by mouth daily. Skip the dose if systolic BP less than 161 mmHg What changed:  medication strength how much to take additional instructions   aspirin 81 MG chewable tablet Chew by mouth daily.   azithromycin 250 MG tablet  Commonly known as: ZITHROMAX Take 1 tablet (250 mg total) by mouth daily for 3 days.   carbidopa-levodopa 25-100 MG tablet Commonly known as: SINEMET IR Take 1 tablet by mouth 3 (three) times daily.   cyanocobalamin 1000 MCG tablet Take 1 tablet by mouth daily.   esomeprazole 40 MG capsule Commonly known as: NEXIUM Take 40 mg by mouth daily.   feeding supplement Liqd Take 237 mLs by mouth 2 (two) times daily between meals.   Fluticasone-Umeclidin-Vilant 100-62.5-25 MCG/INH Aepb Inhale 1 puff into the lungs  daily. Treligy   gabapentin 300 MG capsule Commonly known as: NEURONTIN Take 1 capsule (300 mg total) by mouth 3 (three) times daily.   ipratropium-albuterol 0.5-2.5 (3) MG/3ML Soln Commonly known as: DUONEB Take 3 mLs by nebulization every 6 (six) hours.   iron polysaccharides 150 MG capsule Commonly known as: NIFEREX Take 1 capsule (150 mg total) by mouth daily. Start taking on: September 21, 2022   isosorbide mononitrate 30 MG 24 hr tablet Commonly known as: IMDUR Take 1 tablet by mouth daily.   metFORMIN 500 MG tablet Commonly known as: GLUCOPHAGE Take 1 tablet (500 mg total) by mouth 2 (two) times daily with a meal.   metoprolol tartrate 50 MG tablet Commonly known as: LOPRESSOR Take 1 tablet (50 mg total) by mouth 2 (two) times daily. What changed:  medication strength how much to take   mirtazapine 7.5 MG tablet Commonly known as: REMERON Take 1 tablet (7.5 mg total) by mouth at bedtime. Start taking on: September 22, 2022   OXYGEN Inhale 2 L into the lungs continuous.   pravastatin 80 MG tablet Commonly known as: PRAVACHOL Take 80 mg by mouth every evening.   predniSONE 20 MG tablet Commonly known as: DELTASONE Take 2 tablets (40 mg total) by mouth daily with breakfast for 1 day, THEN 1.5 tablets (30 mg total) daily with breakfast for 1 day, THEN 1 tablet (20 mg total) daily with breakfast for 1 day, THEN 0.5 tablets (10 mg total) daily with breakfast for 1 day. Start taking on: September 20, 2022   spironolactone 25 MG tablet Commonly known as: ALDACTONE Take 25 mg by mouth daily.   torsemide 20 MG tablet Commonly known as: DEMADEX Take 1 tablet (20 mg total) by mouth daily for 7 days. Start taking on: September 21, 2022   venlafaxine XR 150 MG 24 hr capsule Commonly known as: EFFEXOR-XR Take 1 capsule (150 mg total) by mouth daily. Take total of 225 mg daily, take along with 75 mg cap   venlafaxine XR 75 MG 24 hr capsule Commonly known as:  EFFEXOR-XR Take 1 capsule (75 mg total) by mouth daily. Take total of 225 mg daily, take along with 150 mg cap   Vitamin D (Ergocalciferol) 1.25 MG (50000 UNIT) Caps capsule Commonly known as: DRISDOL Take 1 capsule (50,000 Units total) by mouth every 7 (seven) days. Start taking on: September 26, 2022       Allergies  Allergen Reactions   Diclofenac-Misoprostol Other (See Comments)    Arthrotec - gastritis    Nsaids Other (See Comments)    gastritis   Zolpidem Other (See Comments)    Sleep walking    Hydrocodone-Acetaminophen Nausea And Vomiting   Simvastatin Other (See Comments)    body aches   Discharge Instructions     Call MD for:  difficulty breathing, headache or visual disturbances   Complete by: As directed    Call MD for:  extreme fatigue  Complete by: As directed    Call MD for:  persistant dizziness or light-headedness   Complete by: As directed    Call MD for:  severe uncontrolled pain   Complete by: As directed    Call MD for:  temperature >100.4   Complete by: As directed    Diet - low sodium heart healthy   Complete by: As directed    Discharge instructions   Complete by: As directed    Follow-up with PCP in 1 week.  Monitor BP and follow with PCP to titrate medications accordingly.  Decreased amlodipine from 10 to 5 mg and increase Lopressor from 25 to 50 mg p.o. daily due to PVCs. Follow-up with pulmonologist in 1 week.   Increase activity slowly   Complete by: As directed        The results of significant diagnostics from this hospitalization (including imaging, microbiology, ancillary and laboratory) are listed below for reference.    Significant Diagnostic Studies: CT Angio Chest Pulmonary Embolism (PE) W or WO Contrast  Result Date: 09/14/2022 CLINICAL DATA:  High probability for PE. EXAM: CT ANGIOGRAPHY CHEST WITH CONTRAST TECHNIQUE: Multidetector CT imaging of the chest was performed using the standard protocol during bolus administration of  intravenous contrast. Multiplanar CT image reconstructions and MIPs were obtained to evaluate the vascular anatomy. RADIATION DOSE REDUCTION: This exam was performed according to the departmental dose-optimization program which includes automated exposure control, adjustment of the mA and/or kV according to patient size and/or use of iterative reconstruction technique. CONTRAST:  61m OMNIPAQUE IOHEXOL 350 MG/ML SOLN COMPARISON:  CT angiogram chest 07/04/2022 FINDINGS: Cardiovascular: Satisfactory opacification of the pulmonary arteries to the segmental level. No evidence of pulmonary embolism. There are atherosclerotic calcifications of the aorta. Aorta is normal in size. Heart is normal in size. There is a small pericardial effusion which is stable from prior. Mediastinum/Nodes: No enlarged mediastinal, hilar, or axillary lymph nodes. Thyroid gland, trachea, and esophagus demonstrate no significant findings. Lungs/Pleura: Trace left and small right pleural effusions are new from prior. There is some atelectasis in the right lower lobe. There is new airspace consolidation in the posterior right upper lobe. Chronic appearing atelectasis loculated fluid in the region of the lingula appears stable from prior. There is some new patchy and ill-defined nodular densities in the bilateral upper lobes with nodular densities measuring up to 8 mm. Mild emphysematous changes persist. No evidence for pneumothorax. Trachea and central airways are patent. Upper Abdomen: Cholecystectomy clips are present. Musculoskeletal: Cervical spinal fusion plate partially visualized. Review of the MIP images confirms the above findings. IMPRESSION: 1. No evidence for pulmonary embolism. 2. New right upper lobe airspace consolidation worrisome for pneumonia. 3. New patchy and ill-defined nodular densities in the bilateral upper lobes favored as infectious/inflammatory. Nodular densities measure up to 8 mm. Follow-up chest CT recommended in 3-6  months to re-evaluate. 4. New small right and trace left pleural effusions. 5. Stable small pericardial effusion. 6. Stable atelectasis and loculated pleural fluid in the region of the lingula. Aortic Atherosclerosis (ICD10-I70.0). Electronically Signed   By: ARonney AstersM.D.   On: 09/14/2022 18:09   DG Chest Portable 1 View  Result Date: 09/13/2022 CLINICAL DATA:  Dyspnea, COPD, recent diagnosis of pneumonia EXAM: PORTABLE CHEST 1 VIEW COMPARISON:  07/30/2022 chest radiograph. FINDINGS: Surgical hardware from ACDF overlies the lower cervical spine. Stable cardiomediastinal silhouette with top-normal heart size. No pneumothorax. Small left pleural effusion, similar, possibly loculated. No significant right pleural effusion. Patchy hazy  opacity in the peripheral left mid lung, decreased. No overt pulmonary edema. IMPRESSION: Patchy hazy opacity in the peripheral left mid lung, decreased, favor improving pneumonia superimposed on chronic lingular scarring as seen on 07/04/2022 chest CT angiogram study. Similar small left pleural effusion, possibly loculated. Continued chest radiograph follow-up advised in 2-3 months or earlier as clinically warranted. Electronically Signed   By: Ilona Sorrel M.D.   On: 09/13/2022 12:41    Microbiology: Recent Results (from the past 240 hour(s))  SARS Coronavirus 2 by RT PCR (hospital order, performed in [facility] [place] hospital lab) *cepheid single result test* Anterior Nasal Swab     Status: None   Collection Time: 09/13/22 12:24 PM   Specimen: Anterior Nasal Swab  Result Value Ref Range Status   SARS Coronavirus 2 by RT PCR NEGATIVE NEGATIVE Final    Comment: (NOTE) SARS-CoV-2 target nucleic acids are NOT DETECTED.  The SARS-CoV-2 RNA is generally detectable in upper and lower respiratory specimens during the acute phase of infection. The lowest concentration of SARS-CoV-2 viral copies this assay can detect is 250 copies / mL. A negative result does not preclude  SARS-CoV-2 infection and should not be used as the sole basis for treatment or other patient management decisions.  A negative result may occur with improper specimen collection / handling, submission of specimen other than nasopharyngeal swab, presence of viral mutation(s) within the areas targeted by this assay, and inadequate number of viral copies (<250 copies / mL). A negative result must be combined with clinical observations, patient history, and epidemiological information.  Fact Sheet for Patients:   https://www.patel.info/  Fact Sheet for Healthcare Providers: https://hall.com/  This test is not yet approved or  cleared by the [place] FDA and has been authorized for detection and/or diagnosis of SARS-CoV-2 by FDA under an Emergency Use Authorization (EUA).  This EUA will remain in effect (meaning this test can be used) for the duration of the COVID-19 declaration under Section 564(b)(1) of the Act, 21 U.S.C. section 360bbb-3(b)(1), unless the authorization is terminated or revoked sooner.  Performed at [facility], [address] [place], [place] [address]   Blood culture (single)     Status: None   Collection Time: 09/14/22  6:45 AM   Specimen: BLOOD  Result Value Ref Range Status   Specimen Description BLOOD RIGHT WRIST  Final   Special Requests   Final    BOTTLES DRAWN AEROBIC AND ANAEROBIC Blood Culture results may not be optimal due to an excessive volume of blood received in culture bottles   Culture   Final    NO GROWTH 5 DAYS Performed at [facility], [address] [place], [place] [address]    Report Status 09/19/2022 FINAL  Final  Respiratory (~20 pathogens) panel by PCR     Status: None   Collection Time: 09/18/22  8:20 AM   Specimen: Nasopharyngeal Swab; Respiratory  Result Value Ref Range Status   Adenovirus NOT DETECTED NOT DETECTED Final   Coronavirus 229E NOT DETECTED NOT  DETECTED Final    Comment: (NOTE) The Coronavirus on the Respiratory Panel, DOES NOT test for the novel  Coronavirus (2019 nCoV)    Coronavirus HKU1 NOT DETECTED NOT DETECTED Final   Coronavirus NL63 NOT DETECTED NOT DETECTED Final   Coronavirus OC43 NOT DETECTED NOT DETECTED Final   Metapneumovirus NOT DETECTED NOT DETECTED Final   Rhinovirus / Enterovirus NOT DETECTED NOT DETECTED Final   Influenza A NOT DETECTED NOT DETECTED Final  Influenza B NOT DETECTED NOT DETECTED Final   Parainfluenza Virus 1 NOT DETECTED NOT DETECTED Final   Parainfluenza Virus 2 NOT DETECTED NOT DETECTED Final   Parainfluenza Virus 3 NOT DETECTED NOT DETECTED Final   Parainfluenza Virus 4 NOT DETECTED NOT DETECTED Final   Respiratory Syncytial Virus NOT DETECTED NOT DETECTED Final   Bordetella pertussis NOT DETECTED NOT DETECTED Final   Bordetella Parapertussis NOT DETECTED NOT DETECTED Final   Chlamydophila pneumoniae NOT DETECTED NOT DETECTED Final   Mycoplasma pneumoniae NOT DETECTED NOT DETECTED Final    Comment: Performed at [facility], [place] [address], [place], [place] [address]     Labs: CBC: Recent Labs  Lab 09/15/22 0526 09/17/22 0502 09/18/22 0553 09/19/22 0823 09/20/22 0633  WBC 10.3 10.3 9.8 9.8 12.7*  HGB 8.5* 8.0* 8.1* 8.8* 8.7*  HCT 28.9* 28.7* 28.2* 30.8* 29.3*  MCV 94.8 99.0 96.9 95.1 92.4  PLT 317 281 286 283 665   Basic Metabolic Panel: Recent Labs  Lab 09/15/22 0526 09/17/22 0502 09/18/22 0553 09/19/22 0823 09/20/22 0633  NA 141 143 141 140 139  K 4.4 4.1 4.1 4.1 3.9  CL 89* 91* 90* 87* 86*  CO2 42* >45* 44* >45* >45*  GLUCOSE 147* 111* 113* 113* 99  BUN 27* 33* 23 27* 25*  CREATININE 0.57 0.53 0.40* 0.61 0.59  CALCIUM 9.2 9.0 8.8* 9.2 9.0  MG  --   --   --  1.9 2.1  PHOS  --   --   --  3.9 3.2   Liver Function Tests: No results for input(s): "AST", "ALT", "ALKPHOS", "BILITOT", "PROT", "ALBUMIN" in the last 168 hours. No results for input(s): "LIPASE",  "AMYLASE" in the last 168 hours. No results for input(s): "AMMONIA" in the last 168 hours. Cardiac Enzymes: No results for input(s): "CKTOTAL", "CKMB", "CKMBINDEX", "TROPONINI" in the last 168 hours. BNP (last 3 results) Recent Labs    08/02/22 0401 08/05/22 0449 09/13/22 1126  BNP 639.5* 219.0* 770.7*   CBG: Recent Labs  Lab 09/19/22 1135 09/19/22 1528 09/19/22 2158 09/20/22 0852 09/20/22 1215  GLUCAP 295* 314* 128* 100* 232*    Time spent: 35 minutes  Signed:  Val Riles  [facility]  09/20/2022 1:59 PM

## 2022-09-20 NOTE — Progress Notes (Signed)
Inpatient Diabetes Program Recommendations  AACE/ADA: New Consensus Statement on Inpatient Glycemic Control (2015)  Target Ranges:  Prepandial:   less than 140 mg/dL      Peak postprandial:   less than 180 mg/dL (1-2 hours)      Critically ill patients:  140 - 180 mg/dL   Lab Results  Component Value Date   GLUCAP 232 (H) 09/20/2022   HGBA1C 6.1 (H) 07/05/2022    Review of Glycemic Control  Latest Reference Range & Units 09/19/22 21:58 09/20/22 08:52 09/20/22 12:15  Glucose-Capillary 70 - 99 mg/dL 128 (H) 100 (H) 232 (H)  (H): Data is abnormally high Diabetes history: DM2 Outpatient Diabetes medications: Metformin 500 mg BID Current orders for Inpatient glycemic control: Novolog 0-15 units TID with meals, Novolog 0-5 units QHS; Prednisone 40 mg QAM; Ensure Enlive 237 ml BID  Inpatient Diabetes Program Recommendations:    Blood sugars remain elevated post-prandial with Prednisone.  Consider adding Novolog meal coverage 3 units tid with meals (hold if patient eats less than 50% or NPO).    Thanks,  Adah Perl, RN, BC-ADM Inpatient Diabetes Coordinator Pager [phone]  (8a-5p)

## 2022-09-20 NOTE — Care Management Important Message (Signed)
Important Message  Patient Details  Name: Suzanne Glenn MRN: [id number] Date of Birth: 03-Feb-1951   Medicare Important Message Given:  Yes     Dannette Barbara 09/20/2022, 2:09 PM

## 2022-09-20 NOTE — Progress Notes (Signed)
PULMONOLOGY         Date: 09/20/2022,   MRN# [id number] Suzanne Glenn 16-Jul-1951     AdmissionWeight: 83.9 kg                 CurrentWeight: 84.7 kg  Referring provider: Dr Sheppard Coil   CHIEF COMPLAINT:   Acute on chronic hypoxemic respiratory failure   HISTORY OF PRESENT ILLNESS   This is a pleasant 71 year old female with a history of anxiety disorder with asthma, right upper lobe lung cancer, advanced COPD with centrilobular emphysema and chronic hypoxemia on supplemental oxygen at rest, coronary artery disease, major depressive disorder, diabetes type 2, gastroesophageal reflux disease, daily migraine headaches hypertension, dyslipidemia neuropathy and restless leg syndrome, who came in with respiratory distress however did not have severe hypoxemia and vital signs were essentially normal besides mild tachypnea she had blood work done which showed normal renal function mildly elevated blood glucose mild leukocytosis with heme with hemoglobin being 9.1 and WBC count being 12.3 she received Solu-Medrol x 1 nebulizer treatment in the ED followed by empiric Rocephin and Zithromax for possible.  She reports being around a young grandchild who possibly was sick around Thanksgiving.  She is a lifelong smoker and currently continues to smoke.  She had CT chest performed which I personally reviewed, showing a consolidated infiltrate of the right lower lobe with associated mild to moderate pleural effusion.   09/19/22-patient is on 5L/min this am, she reports improvement.  Still requires assistance to get OOB. Inflammatory workup is negative, microbiology including 20 path RVP is negative, fluid balance is net 2.7L diuresed.  I have reduced prednisone 47m and she has multiple antibiotic allergies but once we discussed this she said she can take any antibiotics.  I have started zithromax 250 po daily.  She feels well enough to ask if she can go home today.  Will discuss with attending  physician if possible to initiate dc planning.   09/20/22- patient seen and examined at bedside.  She is cleared from pulmonary for dc home with outpatient follow up   PAST MEDICAL HISTORY   Past Medical History:  Diagnosis Date   Anxiety    Asthma    [place]   [place]/u for right upper lobe mass/cancer   COPD (chronic obstructive pulmonary disease) (HCC)    also, emphysema. now using o2 via np 24 hours a day   Coronary artery disease    Depression    Diabetes mellitus, type II (HCC)    GERD (gastroesophageal reflux disease)    Headache    migraines in early 20's   Heart murmur    HLD (hyperlipidemia)    Hypertension    [place]    Neuropathy    Restless leg      SURGICAL HISTORY   Past Surgical History:  Procedure Laterality Date   BACK SURGERY  2005   surgery x 2, disc fused in neck, pinched nerve in center of back and neck   CARDIAC CATHETERIZATION  2015   1 stent placed for blockage   cervical bone infusion     CHOLECYSTECTOMY     DILATION AND CURETTAGE OF UTERUS     FOOT SURGERY Left    foot bone spur removed   THORACIC DISC SURGERY     TUBAL LIGATION     VIDEO BRONCHOSCOPY WITH ENDOBRONCHIAL ULTRASOUND N/A 03/06/2019   Procedure: VIDEO BRONCHOSCOPY WITH ENDOBRONCHIAL ULTRASOUND - DIABETIC;  Surgeon: AOttie Glazier MD;  Location: [facility] ORS;  Service: Thoracic;  Laterality: N/A;     FAMILY HISTORY   Family History  Problem Relation Age of Onset   Anxiety disorder Mother    Cancer - Lung Mother    Depression Sister    Cancer Sister    Cancer Sister    Cancer Sister    Cancer Sister    Heart Problems Sister    Bipolar disorder Sister    Diabetes Sister    Cancer - Lung Sister    Hypertension Sister    Diabetes Sister    Hyperlipidemia Sister    COPD Sister    Depression Brother    Heart Problems Brother    Cancer Brother    Cancer Brother    Cancer Brother    Breast cancer Maternal Aunt    Depression Son      SOCIAL  HISTORY   Social History   Tobacco Use   Smoking status: Every Day    Packs/day: 1.00    Years: 40.00    Total pack years: 40.00    Types: Cigarettes    Start date: 05/05/1975   Smokeless tobacco: Never  Vaping Use   Vaping Use: Never used  Substance Use Topics   Alcohol use: No    Alcohol/week: 0.0 standard drinks of alcohol    Comment: socially   Drug use: No     MEDICATIONS    Home Medication:    Current Medication:  Current Facility-Administered Medications:    albuterol (PROVENTIL) (2.5 MG/3ML) 0.083% nebulizer solution 2.5 mg, 2.5 mg, Nebulization, Q4H PRN, Val Riles, MD   ALPRAZolam Duanne Moron) tablet 0.5 mg, 0.5 mg, Oral, BID PRN, Emeterio Reeve, DO, 0.5 mg at 09/15/22 1737   aspirin chewable tablet 81 mg, 81 mg, Oral, Daily, Cox, Amy N, DO, 81 mg at 09/19/22 1006   azithromycin (ZITHROMAX) tablet 250 mg, 250 mg, Oral, Daily, Lanney Gins, Ivalee Strauser, MD, 250 mg at 09/19/22 1007   carbidopa-levodopa (SINEMET IR) 25-100 MG per tablet immediate release 1 tablet, 1 tablet, Oral, TID, Cox, Amy N, DO, 1 tablet at 09/19/22 2220   chlorpheniramine-HYDROcodone (TUSSIONEX) 10-8 MG/5ML suspension 5 mL, 5 mL, Oral, Q12H PRN, Val Riles, MD   enoxaparin (LOVENOX) injection 42.5 mg, 0.5 mg/kg, Subcutaneous, Q24H, Cox, Amy N, DO, 42.5 mg at 09/19/22 2220   feeding supplement (ENSURE ENLIVE / ENSURE PLUS) liquid 237 mL, 237 mL, Oral, BID BM, Cox, Amy N, DO, 237 mL at 09/19/22 1236   fluticasone furoate-vilanterol (BREO ELLIPTA) 100-25 MCG/ACT 1 puff, 1 puff, Inhalation, Daily, 1 puff at 09/19/22 1009 **AND** umeclidinium bromide (INCRUSE ELLIPTA) 62.5 MCG/ACT 1 puff, 1 puff, Inhalation, Daily, Cox, Amy N, DO, 1 puff at 09/19/22 1009   gabapentin (NEURONTIN) capsule 300 mg, 300 mg, Oral, TID, Cox, Amy N, DO, 300 mg at 09/19/22 2220   guaiFENesin (MUCINEX) 12 hr tablet 600 mg, 600 mg, Oral, BID, Val Riles, MD, 600 mg at 09/19/22 2220   hydrALAZINE (APRESOLINE) injection 10 mg, 10 mg,  Intravenous, Q8H PRN, Emeterio Reeve, DO   influenza vaccine adjuvanted (FLUAD) injection 0.5 mL, 0.5 mL, Intramuscular, Tomorrow-1000, Alexander, Natalie, DO   insulin aspart (novoLOG) injection 0-15 Units, 0-15 Units, Subcutaneous, TID WC, Cox, Amy N, DO, 11 Units at 09/19/22 1719   insulin aspart (novoLOG) injection 0-5 Units, 0-5 Units, Subcutaneous, QHS, Cox, Amy N, DO, 3 Units at 09/18/22 2107   ipratropium-albuterol (DUONEB) 0.5-2.5 (3) MG/3ML nebulizer solution 3 mL, 3 mL, Nebulization, TID, Val Riles, MD, 3 mL  at 09/19/22 1959   iron polysaccharides (NIFEREX) capsule 150 mg, 150 mg, Oral, Daily, Val Riles, MD, 150 mg at 09/19/22 1718   isosorbide mononitrate (IMDUR) 24 hr tablet 30 mg, 30 mg, Oral, Daily, Cox, Amy N, DO, 30 mg at 09/19/22 1006   metoprolol tartrate (LOPRESSOR) tablet 25 mg, 25 mg, Oral, BID, Val Riles, MD, 25 mg at 09/19/22 2220   nicotine (NICODERM CQ - dosed in mg/24 hours) patch 14 mg, 14 mg, Transdermal, Daily PRN, Cox, Amy N, DO, 14 mg at 09/13/22 2137   Oral care mouth rinse, 15 mL, Mouth Rinse, PRN, Emeterio Reeve, DO   pantoprazole (PROTONIX) EC tablet 80 mg, 80 mg, Oral, Q1200, Cox, Amy N, DO, 80 mg at 09/19/22 1236   pneumococcal 20-valent conjugate vaccine (PREVNAR 20) injection 0.5 mL, 0.5 mL, Intramuscular, Tomorrow-1000, Alexander, Natalie, DO   pravastatin (PRAVACHOL) tablet 80 mg, 80 mg, Oral, QPM, Cox, Amy N, DO, 80 mg at 09/19/22 1718   predniSONE (DELTASONE) tablet 40 mg, 40 mg, Oral, Q breakfast, Lanney Gins, Jazmyn Offner, MD, 40 mg at 09/19/22 1006   senna-docusate (Senokot-S) tablet 1 tablet, 1 tablet, Oral, QHS PRN, Cox, Amy N, DO   spironolactone (ALDACTONE) tablet 25 mg, 25 mg, Oral, Daily, Cox, Amy N, DO, 25 mg at 09/19/22 1007   torsemide (DEMADEX) tablet 20 mg, 20 mg, Oral, Daily, Lanney Gins, Amoree Newlon, MD, 20 mg at 09/19/22 1006   venlafaxine XR (EFFEXOR-XR) 24 hr capsule 150 mg, 150 mg, Oral, Daily, Cox, Amy N, DO, 150 mg at 09/19/22 1006    venlafaxine XR (EFFEXOR-XR) 24 hr capsule 75 mg, 75 mg, Oral, Daily, Cox, Amy N, DO, 75 mg at 09/19/22 1007   Vitamin D (Ergocalciferol) (DRISDOL) 1.25 MG (50000 UNIT) capsule 50,000 Units, 50,000 Units, Oral, Q7 days, Val Riles, MD, 50,000 Units at 09/19/22 1719    ALLERGIES   Diclofenac-misoprostol, Nsaids, Zolpidem, Hydrocodone-acetaminophen, and Simvastatin     REVIEW OF SYSTEMS    Review of Systems:  Gen:  Denies  fever, sweats, chills weigh loss  HEENT: Denies blurred vision, double vision, ear pain, eye pain, hearing loss, nose bleeds, sore throat Cardiac:  No dizziness, chest pain or heaviness, chest tightness,edema Resp:   reports dyspnea chronically  Gi: Denies swallowing difficulty, stomach pain, nausea or vomiting, diarrhea, constipation, bowel incontinence Gu:  Denies bladder incontinence, burning urine Ext:   Denies Joint pain, stiffness or swelling Skin: Denies  skin rash, easy bruising or bleeding or hives Endoc:  Denies polyuria, polydipsia , polyphagia or weight change Psych:   Denies depression, insomnia or hallucinations   Other:  All other systems negative   VS: BP (!) 125/90 (BP Location: Left Arm)   Pulse 90   Temp 98.6 F (37 C)   Resp 18   Ht _0  (1.6 m)   Wt 84.7 kg   SpO2 99%   BMI 33.07 kg/m      PHYSICAL EXAM    GENERAL:NAD, no fevers, chills, no weakness no fatigue HEAD: Normocephalic, atraumatic.  EYES: Pupils equal, round, reactive to light. Extraocular muscles intact. No scleral icterus.  MOUTH: Moist mucosal membrane. Dentition intact. No abscess noted.  EAR, NOSE, THROAT: Clear without exudates. No external lesions.  NECK: Supple. No thyromegaly. No nodules. No JVD.  PULMONARY: decreased breath sounds with mild rhonchi worse at bases bilaterally.  CARDIOVASCULAR: S1 and S2. Regular rate and rhythm. No murmurs, rubs, or gallops. No edema. Pedal pulses 2+ bilaterally.  GASTROINTESTINAL: Soft, nontender, nondistended. No  masses. Positive  bowel sounds. No hepatosplenomegaly.  MUSCULOSKELETAL: No swelling, clubbing, or edema. Range of motion full in all extremities.  NEUROLOGIC: Cranial nerves II through XII are intact. No gross focal neurological deficits. Sensation intact. Reflexes intact.  SKIN: No ulceration, lesions, rashes, or cyanosis. Skin warm and dry. Turgor intact.  PSYCHIATRIC: Mood, affect within normal limits. The patient is awake, alert and oriented x 3. Insight, judgment intact.       IMAGING     ASSESSMENT/PLAN   Acute hypoxemic respiratory failure - present on admission  - COVID19 -negative   - supplemental O2 during my evaluation -4L/min - suspect this is likely viral LRTI acute exacerbation with atelectasis -serum fungitell -legionella ab -strep pneumoniae ur AG -Histoplasma Ur Ag -sputum resp cultures -AFB sputum expectorated specimen -sputum cytology  -reviewed pertinent imaging with patient today - ESR/CRP -PT/OT for d/c planning  -please encourage patient to use incentive spirometer few times each hour while hospitalized.       Right moderate pleural effusion           - noted patient on aldactone , will add torsemide - allergy is not clinically significant    -dc amlodipine for now to avoid transient hypotension     -patient diuresed 2.7lbs overnight - 09/19/22  Compressiive atelectasis    - need more aggressive bph with metaneb qid        Thank you for allowing me to participate in the care of this patient.   Patient/Family are satisfied with care plan and all questions have been answered.    Provider disclosure: Patient with at least one acute or chronic illness or injury that poses a threat to life or bodily function and is being managed actively during this encounter.  All of the below services have been performed independently by signing provider:  review of prior documentation from internal and or external health records.  Review of previous and current  lab results.  Interview and comprehensive assessment during patient visit today. Review of current and previous chest radiographs/CT scans. Discussion of management and test interpretation with health care team and patient/family.   This document was prepared using Dragon voice recognition software and may include unintentional dictation errors.     Ottie Glazier, M.D.  Division of Pulmonary & Critical Care Medicine

## 2022-09-20 NOTE — Progress Notes (Addendum)
Physical Therapy Treatment Patient Details Name: Suzanne Glenn MRN: [id number] DOB: 10-Apr-1951 Today's Date: 09/20/2022   History of Present Illness 71 yo female presents with acute on chronic hypoxemic respiratory failure. COPD current tobacco use, oxygen supplementation at 4 L nasal cannula, GERD, hyperlipidemia, hypertension    PT Comments    Pt found supine in bed upon PT entry on 3.5 L of O2 via [place]. Pt Mod I with bed mobility but desat to 83% sitting at EOB. O2 increased to 5 L via [place] to maintain stats above 90% for mobility. Pt ambulated 16 ft with RW and supervision. Pt limited due to fatigue and returned to bed. Pt left on 89-90% O2 on 4 L via [place] with max cueing for PLB. HR remained WNL throughout session. Pt would benefit from skilled physical therapy to address the listed deficits (see below) to increase independence with ADLs and function. Current recommendation remains appropriate.    Recommendations for follow up therapy are one component of a multi-disciplinary discharge planning process, led by the attending physician.  Recommendations may be updated based on patient status, additional functional criteria and insurance authorization.  Follow Up Recommendations  Home health PT     Assistance Recommended at Discharge Intermittent Supervision/Assistance  Patient can return home with the following A little help with walking and/or transfers;A little help with bathing/dressing/bathroom;Assist for transportation;Assistance with cooking/housework;Help with stairs or ramp for entrance   Equipment Recommendations  None recommended by PT    Recommendations for Other Services       Precautions / Restrictions Precautions Precautions: Fall Restrictions Weight Bearing Restrictions: No     Mobility  Bed Mobility Overal bed mobility: Modified Independent                  Transfers Overall transfer level: Needs assistance Equipment used: Rolling walker (2  wheels) Transfers: Sit to/from Stand Sit to Stand: Supervision                Ambulation/Gait Ambulation/Gait assistance: Supervision Gait Distance (Feet): 60 Feet Assistive device: Rolling walker (2 wheels) Gait Pattern/deviations: WFL(Within Functional Limits)       General Gait Details: slow ambulation with no LOB   Stairs             Wheelchair Mobility    Modified Rankin (Stroke Patients Only)       Balance Overall balance assessment: Needs assistance Sitting-balance support: Feet supported Sitting balance-Leahy Scale: Good     Standing balance support: Bilateral upper extremity supported Standing balance-Leahy Scale: Good                              Cognition Arousal/Alertness: Awake/alert Behavior During Therapy: WFL for tasks assessed/performed Overall Cognitive Status: Within Functional Limits for tasks assessed                                          Exercises      General Comments General comments (skin integrity, edema, etc.): reports fatgue and SOB with activity      Pertinent Vitals/Pain Pain Assessment Pain Assessment: No/denies pain    Home Living                          Prior Function  PT Goals (current goals can now be found in the care plan section) Acute Rehab PT Goals Patient Stated Goal: to return home PT Goal Formulation: With patient Time For Goal Achievement: 10/03/22 Potential to Achieve Goals: Good Progress towards PT goals: Progressing toward goals    Frequency    Min 2X/week      PT Plan Current plan remains appropriate    Co-evaluation              AM-PAC PT "6 Clicks" Mobility   Outcome Measure  Help needed turning from your back to your side while in a flat bed without using bedrails?: A Little Help needed moving from lying on your back to sitting on the side of a flat bed without using bedrails?: A Little Help needed moving to and  from a bed to a chair (including a wheelchair)?: None Help needed standing up from a chair using your arms (e.g., wheelchair or bedside chair)?: None Help needed to walk in hospital room?: A Little Help needed climbing 3-5 steps with a railing? : A Lot 6 Click Score: 19    End of Session Equipment Utilized During Treatment: Gait belt Activity Tolerance: Patient limited by fatigue Patient left: in bed;with call bell/phone within reach Nurse Communication: Mobility status PT Visit Diagnosis: Unsteadiness on feet (R26.81);Other abnormalities of gait and mobility (R26.89);Muscle weakness (generalized) (M62.81);Difficulty in walking, not elsewhere classified (R26.2);Other symptoms and signs involving the nervous system (R29.898)     Time: 8469-6295 PT Time Calculation (min) (ACUTE ONLY): 27 min  Charges:  $Therapeutic Activity: 23-37 mins                    [employer], SPT  09/20/2022, 11:56 AM

## 2022-10-01 DIAGNOSIS — Z09 Encounter for follow-up examination after completed treatment for conditions other than malignant neoplasm: Secondary | ICD-10-CM | POA: Diagnosis not present

## 2022-10-01 DIAGNOSIS — E1165 Type 2 diabetes mellitus with hyperglycemia: Secondary | ICD-10-CM | POA: Diagnosis not present

## 2022-10-01 DIAGNOSIS — J9611 Chronic respiratory failure with hypoxia: Secondary | ICD-10-CM | POA: Diagnosis not present

## 2022-10-01 DIAGNOSIS — I2510 Atherosclerotic heart disease of native coronary artery without angina pectoris: Secondary | ICD-10-CM | POA: Diagnosis not present

## 2022-10-01 DIAGNOSIS — E119 Type 2 diabetes mellitus without complications: Secondary | ICD-10-CM | POA: Diagnosis not present

## 2022-10-01 DIAGNOSIS — I10 Essential (primary) hypertension: Secondary | ICD-10-CM | POA: Diagnosis not present

## 2022-10-05 ENCOUNTER — Other Ambulatory Visit: Payer: Self-pay | Admitting: Psychiatry

## 2022-10-05 DIAGNOSIS — F411 Generalized anxiety disorder: Secondary | ICD-10-CM

## 2022-10-05 DIAGNOSIS — F331 Major depressive disorder, recurrent, moderate: Secondary | ICD-10-CM

## 2022-10-10 DIAGNOSIS — F325 Major depressive disorder, single episode, in full remission: Secondary | ICD-10-CM | POA: Diagnosis not present

## 2022-10-10 DIAGNOSIS — E1142 Type 2 diabetes mellitus with diabetic polyneuropathy: Secondary | ICD-10-CM | POA: Diagnosis not present

## 2022-10-10 DIAGNOSIS — I10 Essential (primary) hypertension: Secondary | ICD-10-CM | POA: Diagnosis not present

## 2022-10-10 DIAGNOSIS — F419 Anxiety disorder, unspecified: Secondary | ICD-10-CM | POA: Diagnosis not present

## 2022-10-10 DIAGNOSIS — I2510 Atherosclerotic heart disease of native coronary artery without angina pectoris: Secondary | ICD-10-CM | POA: Diagnosis not present

## 2022-10-10 DIAGNOSIS — J449 Chronic obstructive pulmonary disease, unspecified: Secondary | ICD-10-CM | POA: Diagnosis not present

## 2022-10-10 DIAGNOSIS — J439 Emphysema, unspecified: Secondary | ICD-10-CM | POA: Diagnosis not present

## 2022-10-10 DIAGNOSIS — I4891 Unspecified atrial fibrillation: Secondary | ICD-10-CM | POA: Diagnosis not present

## 2022-10-16 DIAGNOSIS — I4891 Unspecified atrial fibrillation: Secondary | ICD-10-CM | POA: Diagnosis not present

## 2022-10-16 DIAGNOSIS — J441 Chronic obstructive pulmonary disease with (acute) exacerbation: Secondary | ICD-10-CM | POA: Diagnosis not present

## 2022-10-16 DIAGNOSIS — F325 Major depressive disorder, single episode, in full remission: Secondary | ICD-10-CM | POA: Diagnosis not present

## 2022-10-16 DIAGNOSIS — I10 Essential (primary) hypertension: Secondary | ICD-10-CM | POA: Diagnosis not present

## 2022-10-16 DIAGNOSIS — J439 Emphysema, unspecified: Secondary | ICD-10-CM | POA: Diagnosis not present

## 2022-10-16 DIAGNOSIS — I2510 Atherosclerotic heart disease of native coronary artery without angina pectoris: Secondary | ICD-10-CM | POA: Diagnosis not present

## 2022-10-16 DIAGNOSIS — E1142 Type 2 diabetes mellitus with diabetic polyneuropathy: Secondary | ICD-10-CM | POA: Diagnosis not present

## 2022-10-17 ENCOUNTER — Other Ambulatory Visit: Payer: Self-pay | Admitting: Psychiatry

## 2022-10-22 ENCOUNTER — Other Ambulatory Visit: Payer: Self-pay | Admitting: Psychiatry

## 2022-10-28 NOTE — Progress Notes (Deleted)
BH MD/PA/NP OP Progress Note  10/28/2022 11:18 AM Suzanne Glenn  MRN:  [id number]  Chief Complaint: No chief complaint on file.  HPI:  - she was admitted for COPD exacerbation since the last visit.   Mini cog- oriented x4, delayed recall 3/3, clock drawing 3/3   Functional Status Instrumental Activities of Daily Living (IADLs):  Suzanne Glenn is independent in the following: medications, cooking Requires assistance with the following: managing finances, driving (her husband does not allow due to her history of syncope)  Activities of Daily Living (ADLs):  Suzanne Glenn is independent in the following: bathing and hygiene, feeding, continence, grooming and toileting, walking    Daily routine: household chores, sees her son every day with 5 yo grandchildren  Exercise: Employment: used to work at [employer], cook in [place] Support: husband  Household: husband in mobile home Marital status: married for 53 years Number of children: 2. (Her daughter passed away in 03-22-2002 from diabetes at 71 year old) She grew up in [place]. She used to have a very close family. There is some discordance with one of her sisters    Visit Diagnosis: No diagnosis found.  Past Psychiatric History: Please see initial evaluation for full details. I have reviewed the history. No updates at this time.     Past Medical History:  Past Medical History:  Diagnosis Date   Anxiety    Asthma    Cancer (HCC) 12/2018   w/u for right upper lobe mass/cancer   COPD (chronic obstructive pulmonary disease) (HCC)    also, emphysema. now using o2 via np 24 hours a day   Coronary artery disease    Depression    Diabetes mellitus, type II (HCC)    GERD (gastroesophageal reflux disease)    Headache    migraines in early 03-23-23   Heart murmur    HLD (hyperlipidemia)    Hypertension    Lung cancer (HCC)    Neuropathy    Restless leg     Past Surgical History:  Procedure Laterality Date   BACK SURGERY  Mar 22, 2004    surgery x 2, disc fused in neck, pinched nerve in center of back and neck   CARDIAC CATHETERIZATION  2015   1 stent placed for blockage   cervical bone infusion     CHOLECYSTECTOMY     DILATION AND CURETTAGE OF UTERUS     FOOT SURGERY Left    foot bone spur removed   THORACIC DISC SURGERY     TUBAL LIGATION     VIDEO BRONCHOSCOPY WITH ENDOBRONCHIAL ULTRASOUND N/A 03/06/2019   Procedure: VIDEO BRONCHOSCOPY WITH ENDOBRONCHIAL ULTRASOUND - DIABETIC;  Surgeon: Vida Rigger, MD;  Location: [facility] ORS;  Service: Thoracic;  Laterality: N/A;    Family Psychiatric History: Please see initial evaluation for full details. I have reviewed the history. No updates at this time.     Family History:  Family History  Problem Relation Age of Onset   Anxiety disorder Mother    Cancer - Lung Mother    Depression Sister    Cancer Sister    Cancer Sister    Cancer Sister    Cancer Sister    Heart Problems Sister    Bipolar disorder Sister    Diabetes Sister    Cancer - Lung Sister    Hypertension Sister    Diabetes Sister    Hyperlipidemia Sister    COPD Sister    Depression Brother    Heart Problems Brother  Cancer Brother    Cancer Brother    Cancer Brother    Breast cancer Maternal Aunt    Depression Son     Social History:  Social History   Socioeconomic History   Marital status: Married    Spouse name: Genevie Cheshire   Number of children: Not on file   Years of education: Not on file   Highest education level: Not on file  Occupational History    Comment: disabled  Tobacco Use   Smoking status: Every Day    Packs/day: 1.00    Years: 40.00    Total pack years: 40.00    Types: Cigarettes    Start date: 05/05/1975   Smokeless tobacco: Never  Vaping Use   Vaping Use: Never used  Substance and Sexual Activity   Alcohol use: No    Alcohol/week: 0.0 standard drinks of alcohol    Comment: socially   Drug use: No   Sexual activity: Not Currently  Other Topics Concern   Not on  file  Social History Narrative   Not on file   Social Determinants of Health   Financial Resource Strain: Not on file  Food Insecurity: No Food Insecurity (09/15/2022)   Hunger Vital Sign    Worried About Running Out of Food in the Last Year: Never true    Ran Out of Food in the Last Year: Never true  Transportation Needs: No Transportation Needs (09/15/2022)   PRAPARE - Administrator, Civil Service (Medical): No    Lack of Transportation (Non-Medical): No  Physical Activity: Not on file  Stress: Not on file  Social Connections: Not on file    Allergies:  Allergies  Allergen Reactions   Diclofenac-Misoprostol Other (See Comments)    Arthrotec - gastritis    Nsaids Other (See Comments)    gastritis   Zolpidem Other (See Comments)    Sleep walking    Hydrocodone-Acetaminophen Nausea And Vomiting   Simvastatin Other (See Comments)    body aches    Metabolic Disorder Labs: Lab Results  Component Value Date   HGBA1C 6.1 (H) 07/05/2022   MPG 128.37 07/05/2022   MPG 148.46 08/20/2021   No results found for: "PROLACTIN" No results found for: "CHOL", "TRIG", "HDL", "CHOLHDL", "VLDL", "LDLCALC" No results found for: "TSH"  Therapeutic Level Labs: No results found for: "LITHIUM" No results found for: "VALPROATE" No results found for: "CBMZ"  Current Medications: Current Outpatient Medications  Medication Sig Dispense Refill   ACCU-CHEK FASTCLIX LANCETS MISC      ACCU-CHEK SMARTVIEW test strip      albuterol (VENTOLIN HFA) 108 (90 Base) MCG/ACT inhaler Inhale 2 puffs into the lungs every 4 (four) hours as needed for wheezing or shortness of breath.      amLODipine (NORVASC) 5 MG tablet Take 1 tablet (5 mg total) by mouth daily. Skip the dose if systolic BP less than 140 mmHg 30 tablet 2   aspirin 81 MG chewable tablet Chew by mouth daily.     carbidopa-levodopa (SINEMET IR) 25-100 MG tablet Take 1 tablet by mouth 3 (three) times daily.     cyanocobalamin 1000  MCG tablet Take 1 tablet by mouth daily.     esomeprazole (NEXIUM) 40 MG capsule Take 40 mg by mouth daily.     feeding supplement (ENSURE ENLIVE / ENSURE PLUS) LIQD Take 237 mLs by mouth 2 (two) times daily between meals. 237 mL 12   Fluticasone-Umeclidin-Vilant 100-62.5-25 MCG/INH AEPB Inhale 1 puff into the  lungs daily. Treligy     gabapentin (NEURONTIN) 300 MG capsule Take 1 capsule (300 mg total) by mouth 3 (three) times daily. 90 capsule 2   ipratropium-albuterol (DUONEB) 0.5-2.5 (3) MG/3ML SOLN Take 3 mLs by nebulization every 6 (six) hours. 360 mL 0   iron polysaccharides (NIFEREX) 150 MG capsule Take 1 capsule (150 mg total) by mouth daily. 90 capsule 0   isosorbide mononitrate (IMDUR) 30 MG 24 hr tablet Take 1 tablet by mouth daily.     metFORMIN (GLUCOPHAGE) 500 MG tablet Take 1 tablet (500 mg total) by mouth 2 (two) times daily with a meal. 60 tablet 1   metoprolol tartrate (LOPRESSOR) 50 MG tablet Take 1 tablet (50 mg total) by mouth 2 (two) times daily. 60 tablet 2   mirtazapine (REMERON) 7.5 MG tablet Take 1 tablet (7.5 mg total) by mouth at bedtime. 90 tablet 0   OXYGEN Inhale 2 L into the lungs continuous.     pravastatin (PRAVACHOL) 80 MG tablet Take 80 mg by mouth every evening.      spironolactone (ALDACTONE) 25 MG tablet Take 25 mg by mouth daily.      torsemide (DEMADEX) 20 MG tablet Take 1 tablet (20 mg total) by mouth daily for 7 days. 7 tablet 0   venlafaxine XR (EFFEXOR-XR) 150 MG 24 hr capsule Take 1 capsule (150 mg total) by mouth daily. Take total of 225 mg daily, take along with 75 mg cap 90 capsule 1   venlafaxine XR (EFFEXOR-XR) 75 MG 24 hr capsule Take 1 capsule (75 mg total) by mouth daily. Take total of 225 mg daily, take along with 150 mg cap 90 capsule 1   Vitamin D, Ergocalciferol, (DRISDOL) 1.25 MG (50000 UNIT) CAPS capsule Take 1 capsule (50,000 Units total) by mouth every 7 (seven) days. 12 capsule 0   No current facility-administered medications for this  visit.     Musculoskeletal: Strength & Muscle Tone: within normal limits Gait & Station: normal Patient leans: N/A  Psychiatric Specialty Exam: Review of Systems  There were no vitals taken for this visit.There is no height or weight on file to calculate BMI.  General Appearance: {Appearance:22683}  Eye Contact:  {BHH EYE CONTACT:22684}  Speech:  Clear and Coherent  Volume:  Normal  Mood:  {BHH MOOD:22306}  Affect:  {Affect (PAA):22687}  Thought Process:  Coherent  Orientation:  Full (Time, Place, and Person)  Thought Content: Logical   Suicidal Thoughts:  {ST/HT (PAA):22692}  Homicidal Thoughts:  {ST/HT (PAA):22692}  Memory:  Immediate;   Good  Judgement:  {Judgement (PAA):22694}  Insight:  {Insight (PAA):22695}  Psychomotor Activity:  Normal  Concentration:  Concentration: Good and Attention Span: Good  Recall:  Good  Fund of Knowledge: Good  Language: Good  Akathisia:  No  Handed:  Right  AIMS (if indicated): not done  Assets:  Communication Skills Desire for Improvement  ADL's:  Intact  Cognition: WNL  Sleep:  {BHH GOOD/FAIR/POOR:22877}   Screenings: GAD-7    Flowsheet Row Office Visit from 09/04/2022 in [place] Office Visit from 07/12/2022 in [place]  Total GAD-7 Score 5 8      PHQ2-9    [place] Visit from 09/04/2022 in [place] Office Visit from 07/12/2022 in [place] Office Visit from 04/19/2022 in [place] Office Visit from 03/15/2022 in [place] Office Visit from 01/22/2022 in [place]  PHQ-2 Total  Score 1 1 0 2 4  PHQ-9 Total Score 5 7 0 12 15      Flowsheet Row ED to Hosp-Admission (Discharged) from 09/13/2022 in [facility] ED to Hosp-Admission (Discharged) from 07/30/2022 in [facility] 1C MEDICAL TELEMETRY Office Visit from 07/12/2022 in [facility]  C-SSRS RISK CATEGORY No Risk No Risk No Risk        Assessment and Plan:  Suzanne Glenn is a 72 y.o. 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    1. MDD (major depressive disorder), recurrent, in partial remission (HCC) 2. GAD (generalized anxiety disorder) Although she had worsening in anxiety in the context of recent admission, it has been improving overall since discharge. Psychosocial stressors includes her medical condition of COPD on oxygen,  loss of her daughter from diabetes several years ago, and conflict with one of her sisters.  She enjoys taking care of her grandchildren.  Although she has been taking higher dose of gabapentin from old prescription, she has not noticed any difference.  She is willing to try again to lower the dose of gabapentin to avoid polypharmacy.  Will continue venlafaxine, mirtazapine to target depression and anxiety.    4. Restless leg syndrome Slightly worsened, which coincided with lowering the dose of gabapentin.  Will continue to assess this.    # r/o delirium- resolving She reports episode of hallucinations when she was in the hospital.  She is alert and no abnormality on mini cog. Will continue to assess this.    # Tobacco use She is at contemplative stage for tobacco use.  She not interested in pharmacological treatment.  Will continue motivational interview.      Plan (she will contact the clinic when she needs a refill) Continue venlafaxine 225 mg daily Continue mirtazapine 7.5 mg at night  Decrease gabapentin 300 mg three times a day  (facial numbness from 600 mg TID) Next appointment-  1/16 at 1 PM for 30 mins, in person - on metoprolol, sinemet for restless leg  [facility]   Past trials of medication: trazodone, doxepin, lunesta, Ambien (sleepwalking), temazepam   This clinician has discussed the side effect associated with medication prescribed during this encounter. Please refer to notes in the previous encounters for more details.     The patient demonstrates the following risk factors for suicide: Chronic risk factors for suicide include: psychiatric disorder of depression . Acute risk factors for suicide include: family or marital conflict, unemployment, and loss (financial, interpersonal, professional). Protective factors for this patient include: positive social support. Considering these factors, the overall suicide risk at this point appears to be low. Patient is appropriate for outpatient follow up.           Collaboration of Care: Collaboration of Care: {BH OP Collaboration of Care:21014065}  Patient/Guardian was advised Release of Information must be obtained prior to any record release in order to collaborate their care with an outside provider. Patient/Guardian was advised if they have not already done so to contact the registration department to sign all necessary forms in order for [place] to release information regarding their care.   Consent: Patient/Guardian gives verbal consent for treatment and assignment of  benefits for services provided during this visit. Patient/Guardian expressed understanding and agreed to proceed.    Neysa Hotter, MD 10/28/2022, 11:18 AM

## 2022-10-30 ENCOUNTER — Ambulatory Visit: Payer: Medicare HMO | Admitting: Psychiatry

## 2022-11-10 DIAGNOSIS — I469 Cardiac arrest, cause unspecified: Secondary | ICD-10-CM | POA: Diagnosis not present

## 2022-11-10 DIAGNOSIS — I499 Cardiac arrhythmia, unspecified: Secondary | ICD-10-CM | POA: Diagnosis not present

## 2022-11-15 DEATH — deceased

## 2022-11-26 ENCOUNTER — Ambulatory Visit: Admission: RE | Admit: 2022-11-26 | Payer: Medicare HMO | Source: Ambulatory Visit

## 2022-11-29 ENCOUNTER — Inpatient Hospital Stay: Payer: Self-pay | Admitting: Oncology

## 2022-12-03 ENCOUNTER — Other Ambulatory Visit: Payer: Self-pay

## 2022-12-05 ENCOUNTER — Ambulatory Visit: Payer: Self-pay | Admitting: Oncology

## 2023-02-14 IMAGING — CT CT CHEST W/ CM
2 of 4 series · 15 of 36 positions shown, 18 images · IV contrast (omnipaque)
Comparison: 08/29/2020

CLINICAL DATA: Left non-small cell lung cancer restaging, status
post chemotherapy and radiation

EXAM:
CT CHEST WITH CONTRAST
TECHNIQUE: Multidetector CT imaging of the chest was performed during
intravenous contrast administration.
CONTRAST:  75mL OMNIPAQUE IOHEXOL 300 MG/ML  SOLN

[Series 2: axial chest 2.00 · axial · 0.68mm/px · z∈[-1150,-880]mm · 12 of 161 slices shown, 15 images]
[im 13/161  mediastinal]
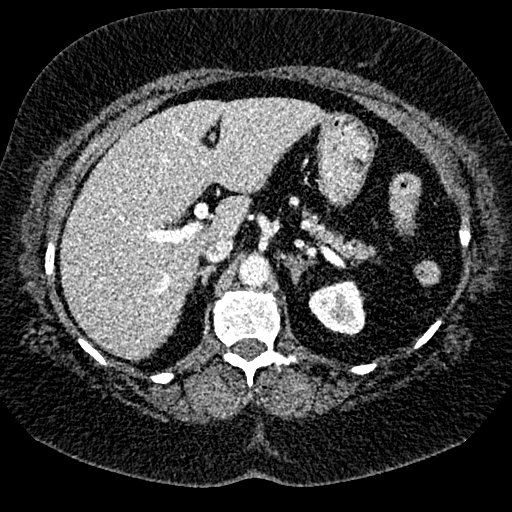
[im 13/161  lung]
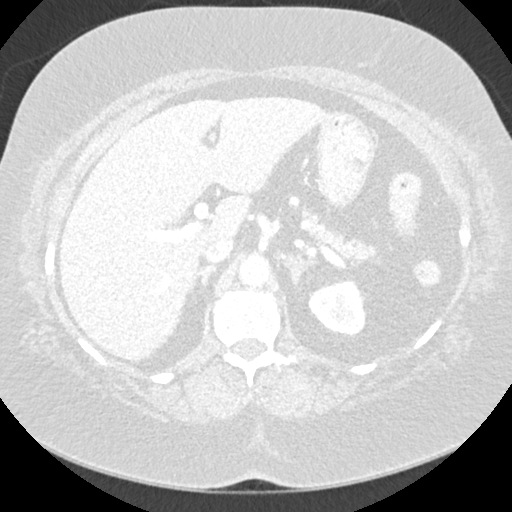
[im 25/161  lung]
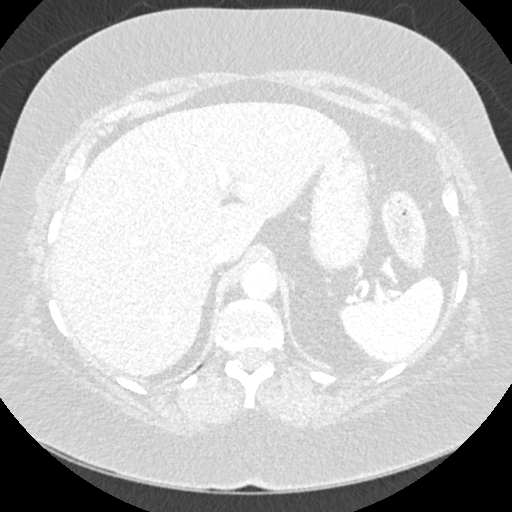
[im 37/161  lung]
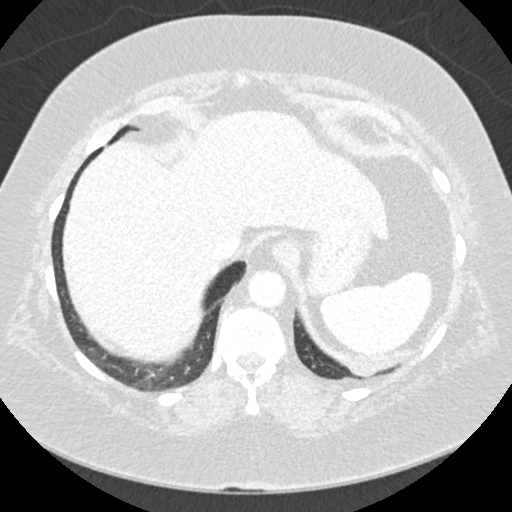
[im 50/161  lung]
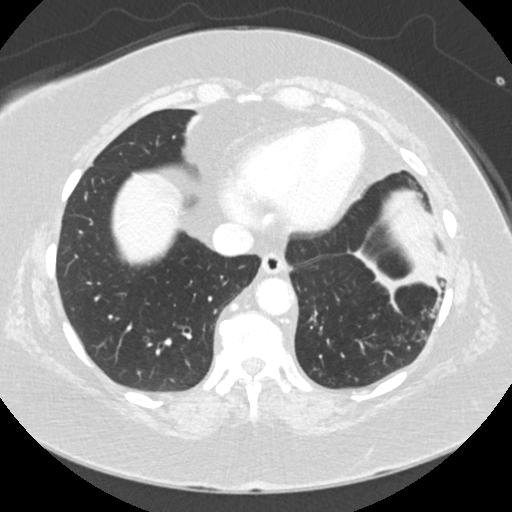
[im 62/161  mediastinal]
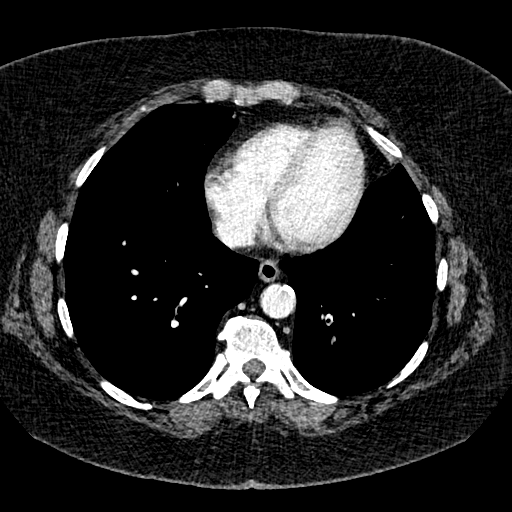
[im 62/161  lung]
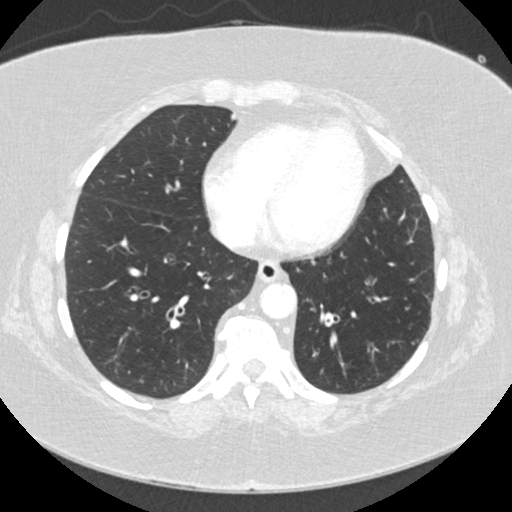
[im 74/161  lung]
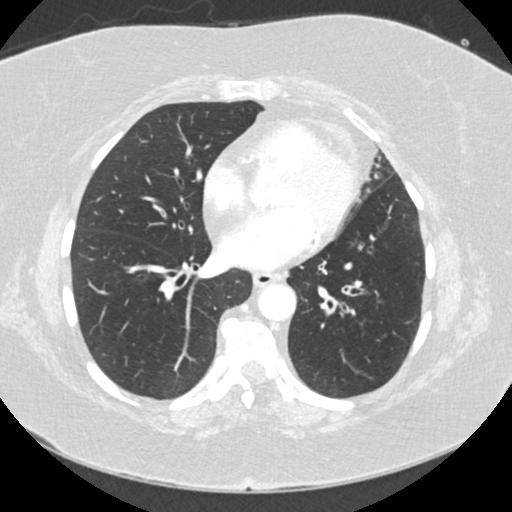
[im 87/161  lung]
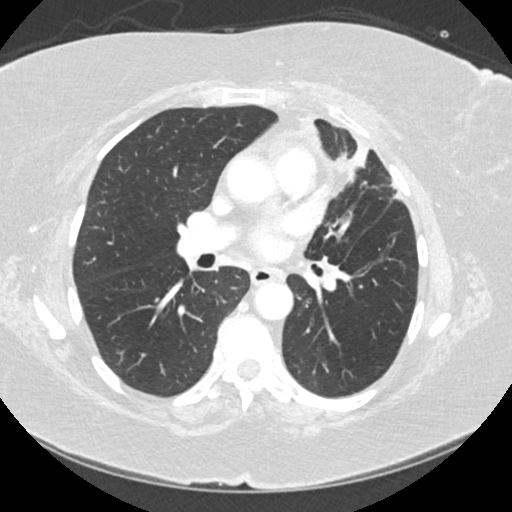
[im 99/161  lung]
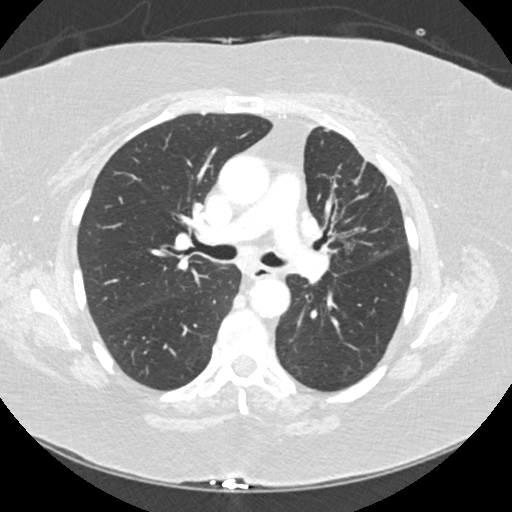
[im 111/161  mediastinal]
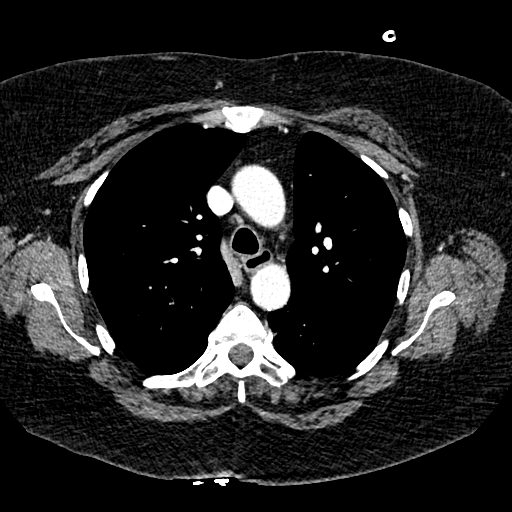
[im 111/161  lung]
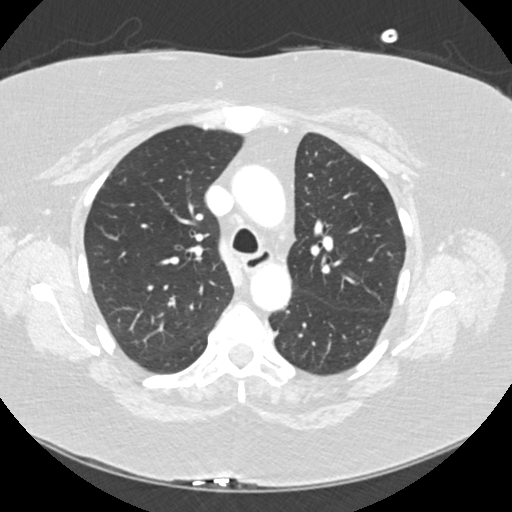
[im 124/161  lung]
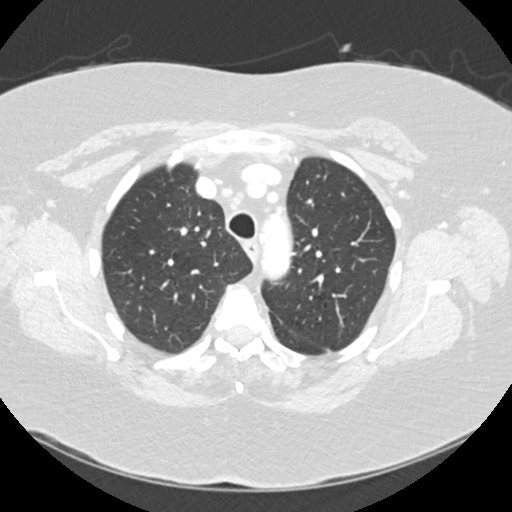
[im 136/161  lung]
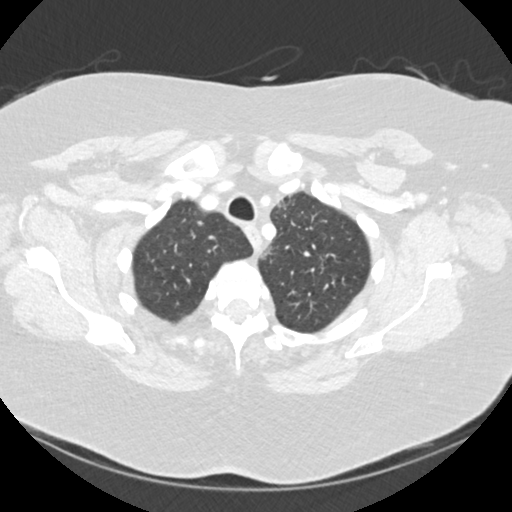
[im 148/161  lung]
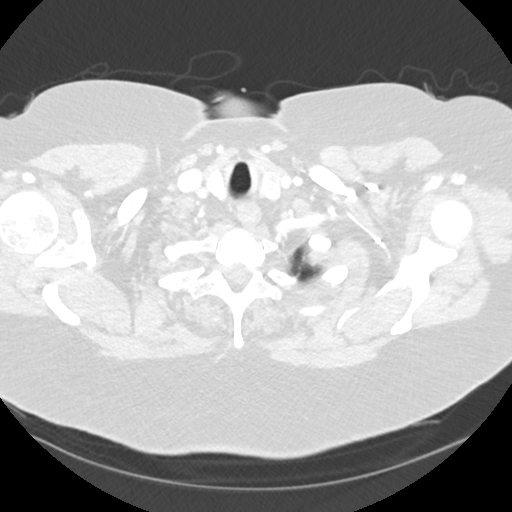

[Series 4: coronal chest 2.00 cor · coronal · 0.63mm/px · 3 of 157 slices shown]
[im 32/157  lung]
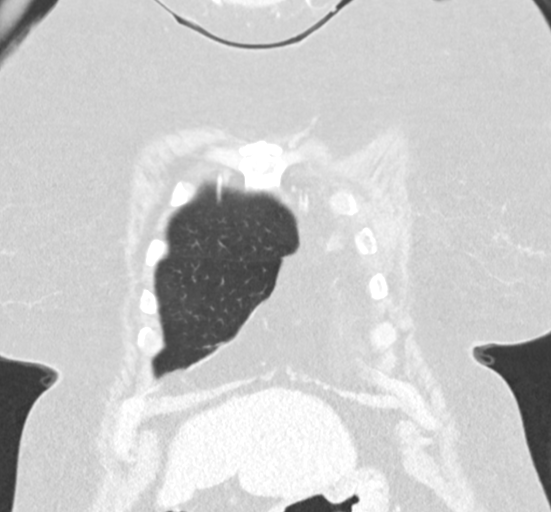
[im 63/157  lung]
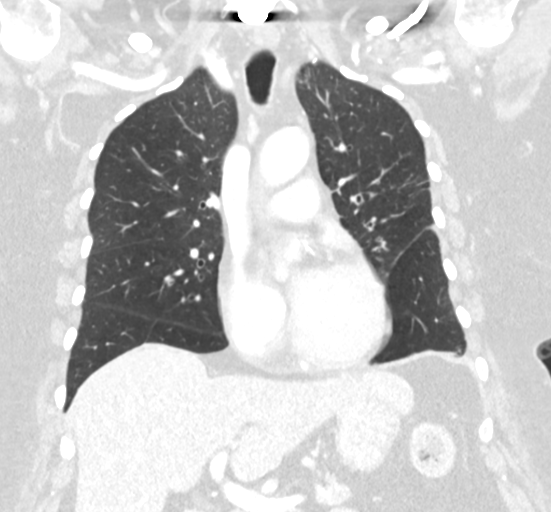
[im 94/157  lung]
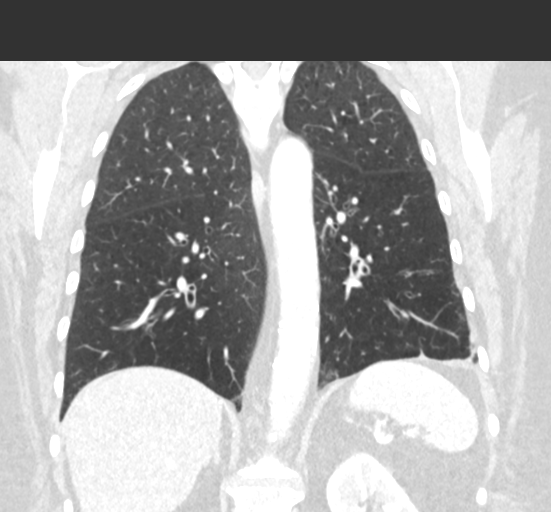

[15 of 36 positions shown; findings below may reference images not displayed]

FINDINGS: Cardiovascular: Aortic atherosclerosis. Normal heart size.
Three-vessel coronary artery calcifications. No pericardial
effusion.

Mediastinum/Nodes: No enlarged mediastinal, hilar, or axillary lymph
nodes. Thyroid gland, trachea, and esophagus demonstrate no
significant findings.

Lungs/Pleura: Mild centrilobular emphysema. Diffuse bilateral
bronchial wall thickening. No significant interval change in post
treatment appearance of a nodule of the lingula, measuring
approximately 1.7 x 1.4 cm with adjacent bandlike scarring and
fibrosis (series 3, image 73). There is some fluctuant centrilobular
nodularity in the posterior lingula which is generally improved
compared to prior examination. Unchanged 4 mm nodule of the left
pulmonary apex (series 3, image 25). No pleural effusion or
pneumothorax.

Upper Abdomen: No acute abnormality.

Musculoskeletal: No chest wall mass or suspicious bone lesions
identified.
IMPRESSION: 1. No significant interval change in post treatment appearance of a
nodule of the lingula, measuring approximately 1.7 x 1.4 cm with
adjacent bandlike scarring and fibrosis.
2. There is some fluctuant centrilobular nodularity in the posterior
lingula which is generally improved compared to prior examination,
consistent with improved although ongoing atypical infection.
3. Unchanged 4 mm nodule of the left pulmonary apex, nonspecific,
most likely infectious or inflammatory. Continued attention on
follow-up.
4. Emphysema.
5. Diffuse bilateral bronchial wall thickening, consistent with
nonspecific infectious or inflammatory bronchitis.
6. Coronary artery disease.

Aortic Atherosclerosis (E1XYK-D59.9) and Emphysema (E1XYK-8EB.1).

## 2023-02-25 ENCOUNTER — Ambulatory Visit: Payer: Medicare HMO | Admitting: Radiation Oncology

## 2023-03-01 ENCOUNTER — Ambulatory Visit: Payer: Medicare HMO | Admitting: Radiation Oncology

## 2023-03-16 ENCOUNTER — Other Ambulatory Visit: Payer: Self-pay | Admitting: Psychiatry

## 2023-04-01 IMAGING — DX DG CHEST 1V PORT
1 series · 1 of 1 positions shown · non-contrast
Comparison: 03/23/2021

CLINICAL DATA: Shortness of breath

EXAM:
PORTABLE CHEST 1 VIEW

[chest ap]
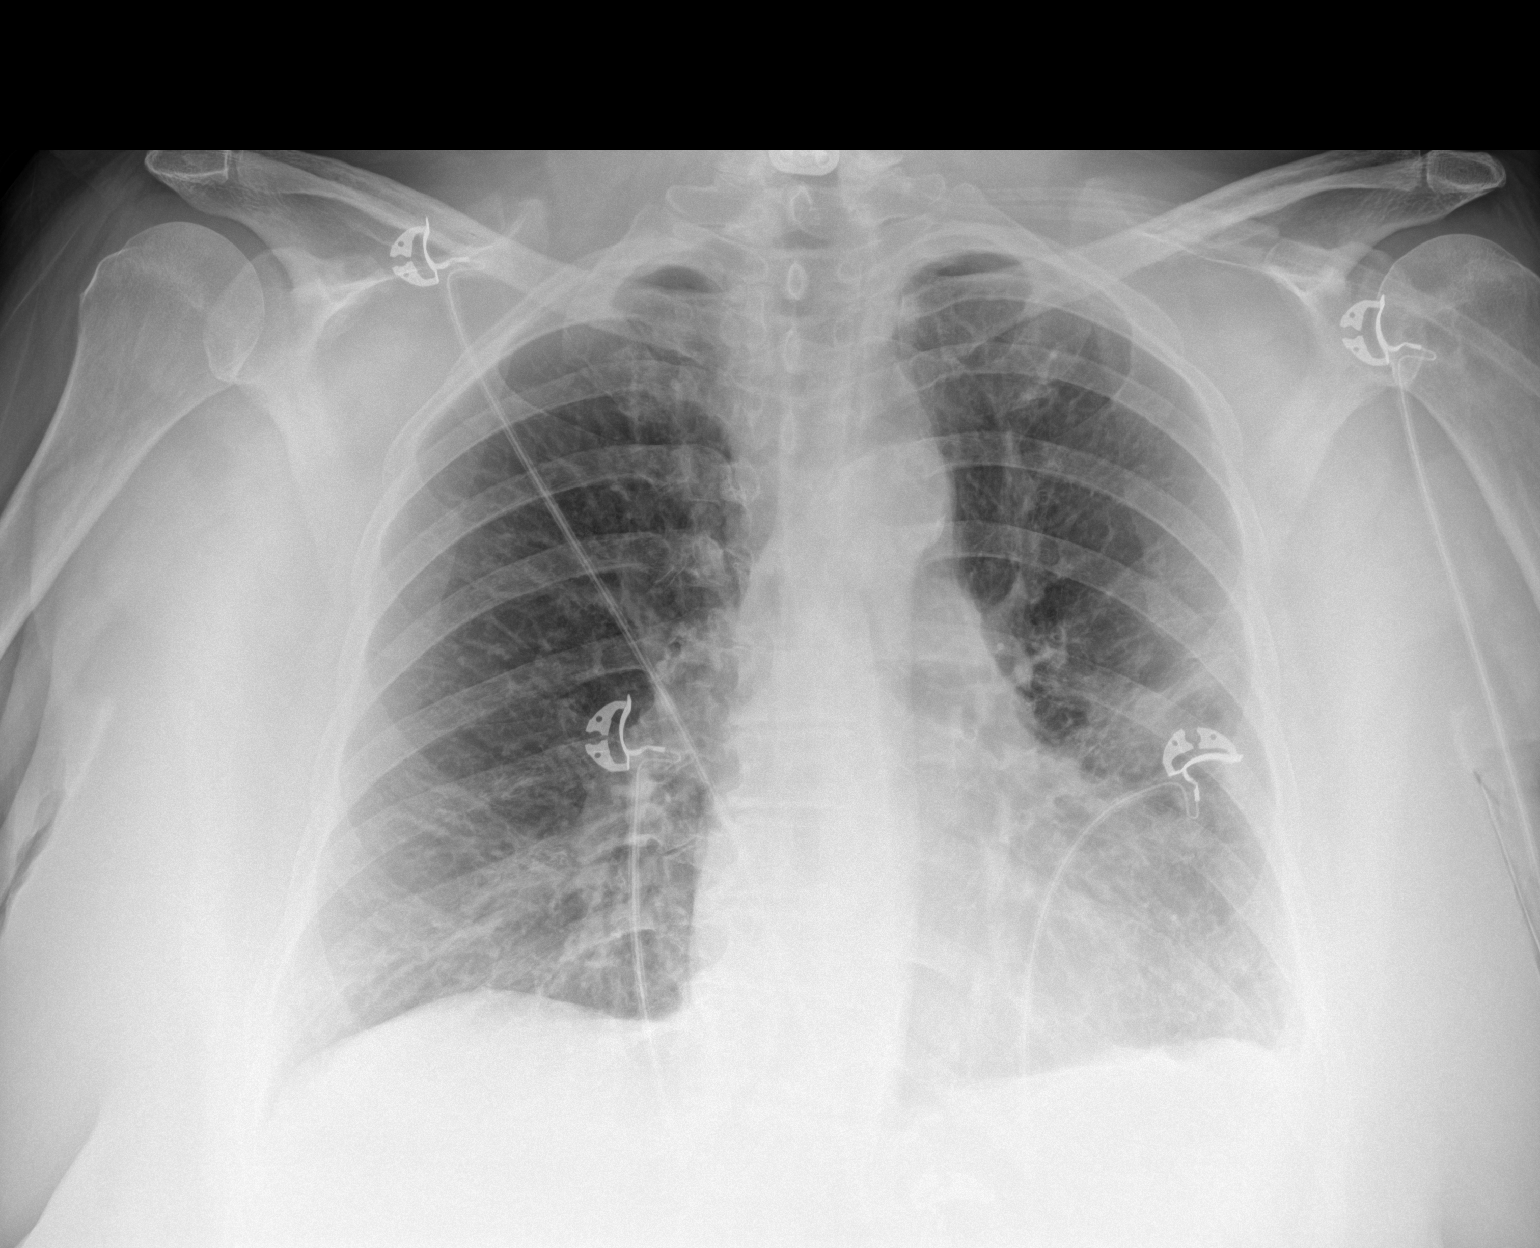

[1 of 1 positions shown; findings below may reference images not displayed]

FINDINGS: Persistent in similar appearance of interstitial prominence and
patchy density at the left greater than right lung bases. Possible
trace left pleural effusion. Stable cardiomediastinal contours with
normal heart size.
IMPRESSION: Similar appearance to prior study. May reflect persistent atypical
pneumonia.

## 2023-08-05 IMAGING — CR DG CHEST 2V
1 series · 2 of 2 positions shown · non-contrast
Comparison: April 14, 2021

CLINICAL DATA: Productive cough and shortness of breath.

EXAM:
CHEST - 2 VIEW

[Series 1: dg chest 2 view · 0.14mm/px · 2 of 2 slices shown]
[im 1/2]
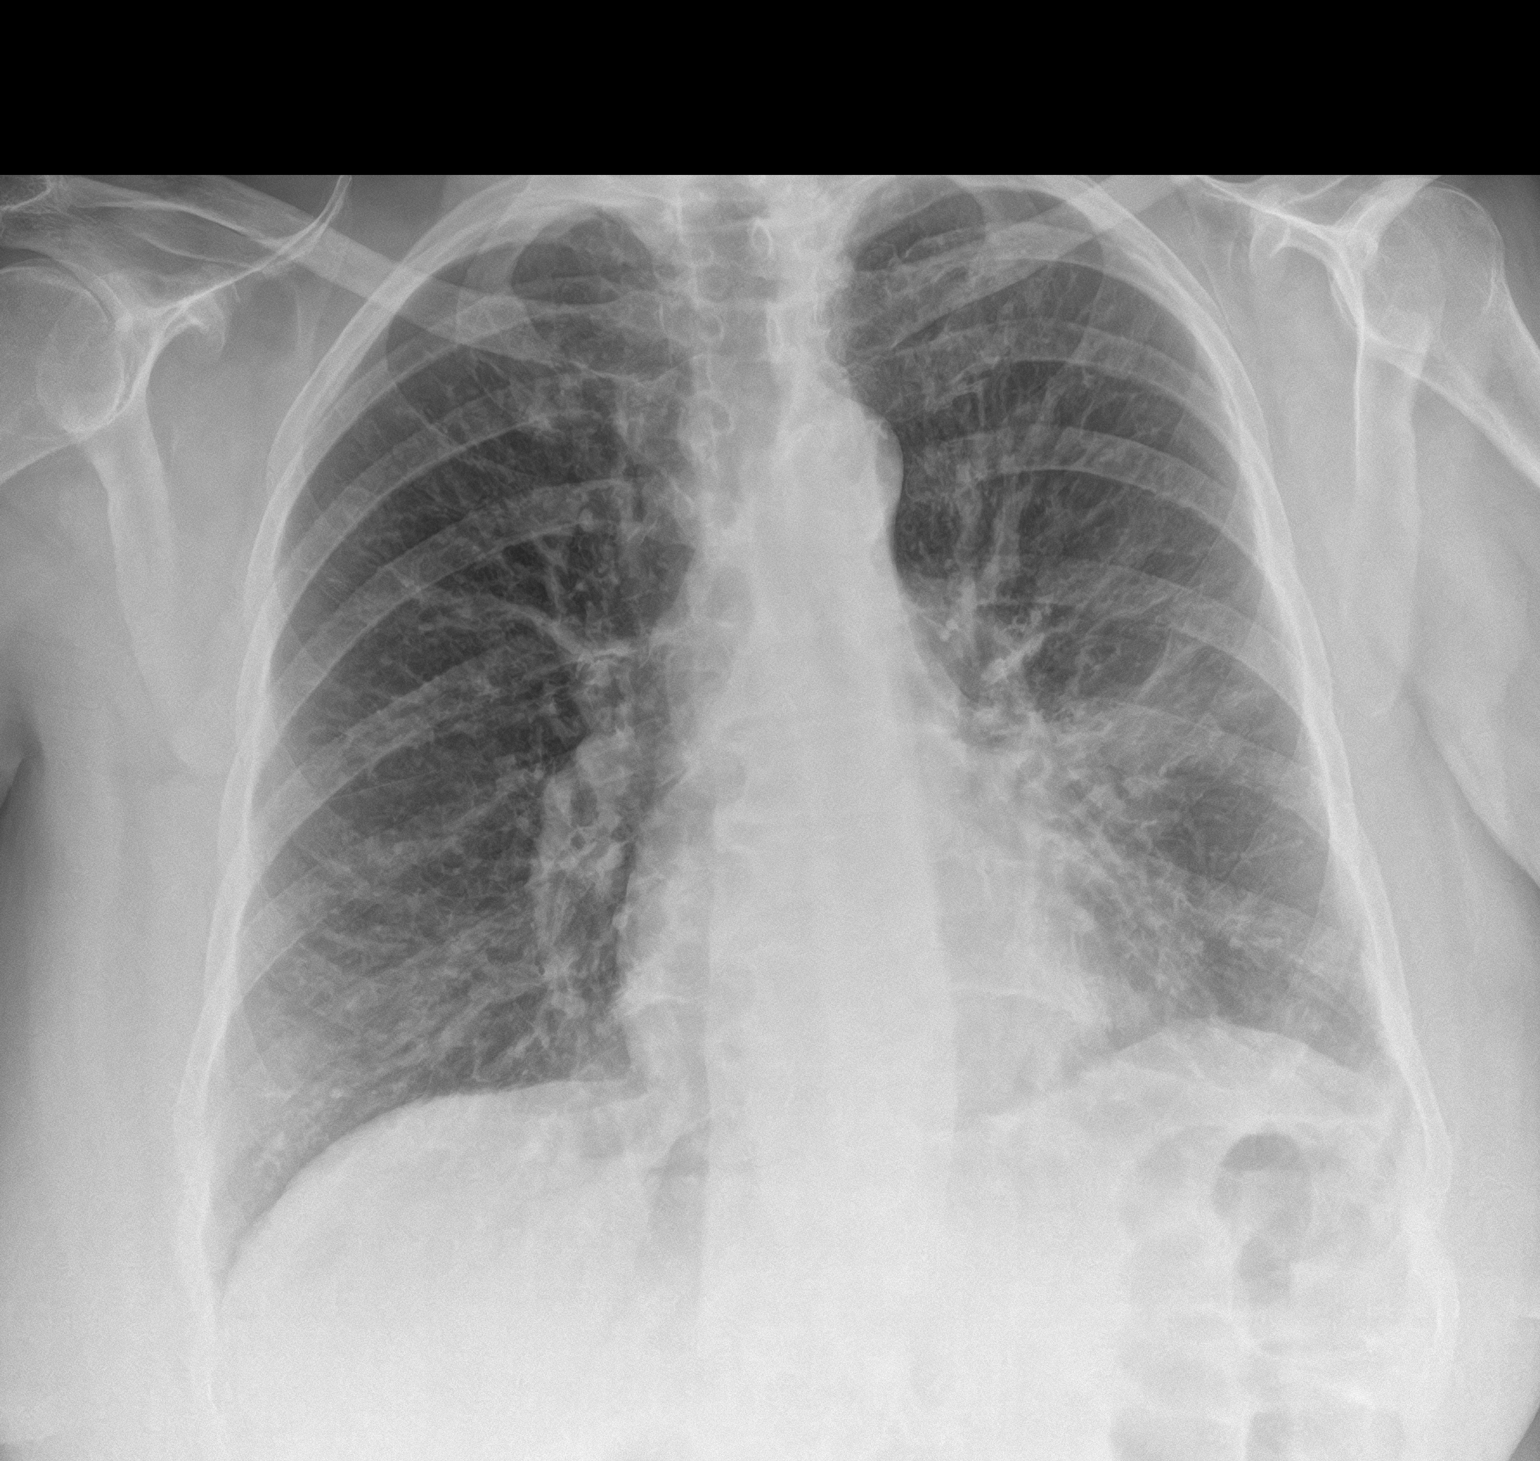
[im 2/2]
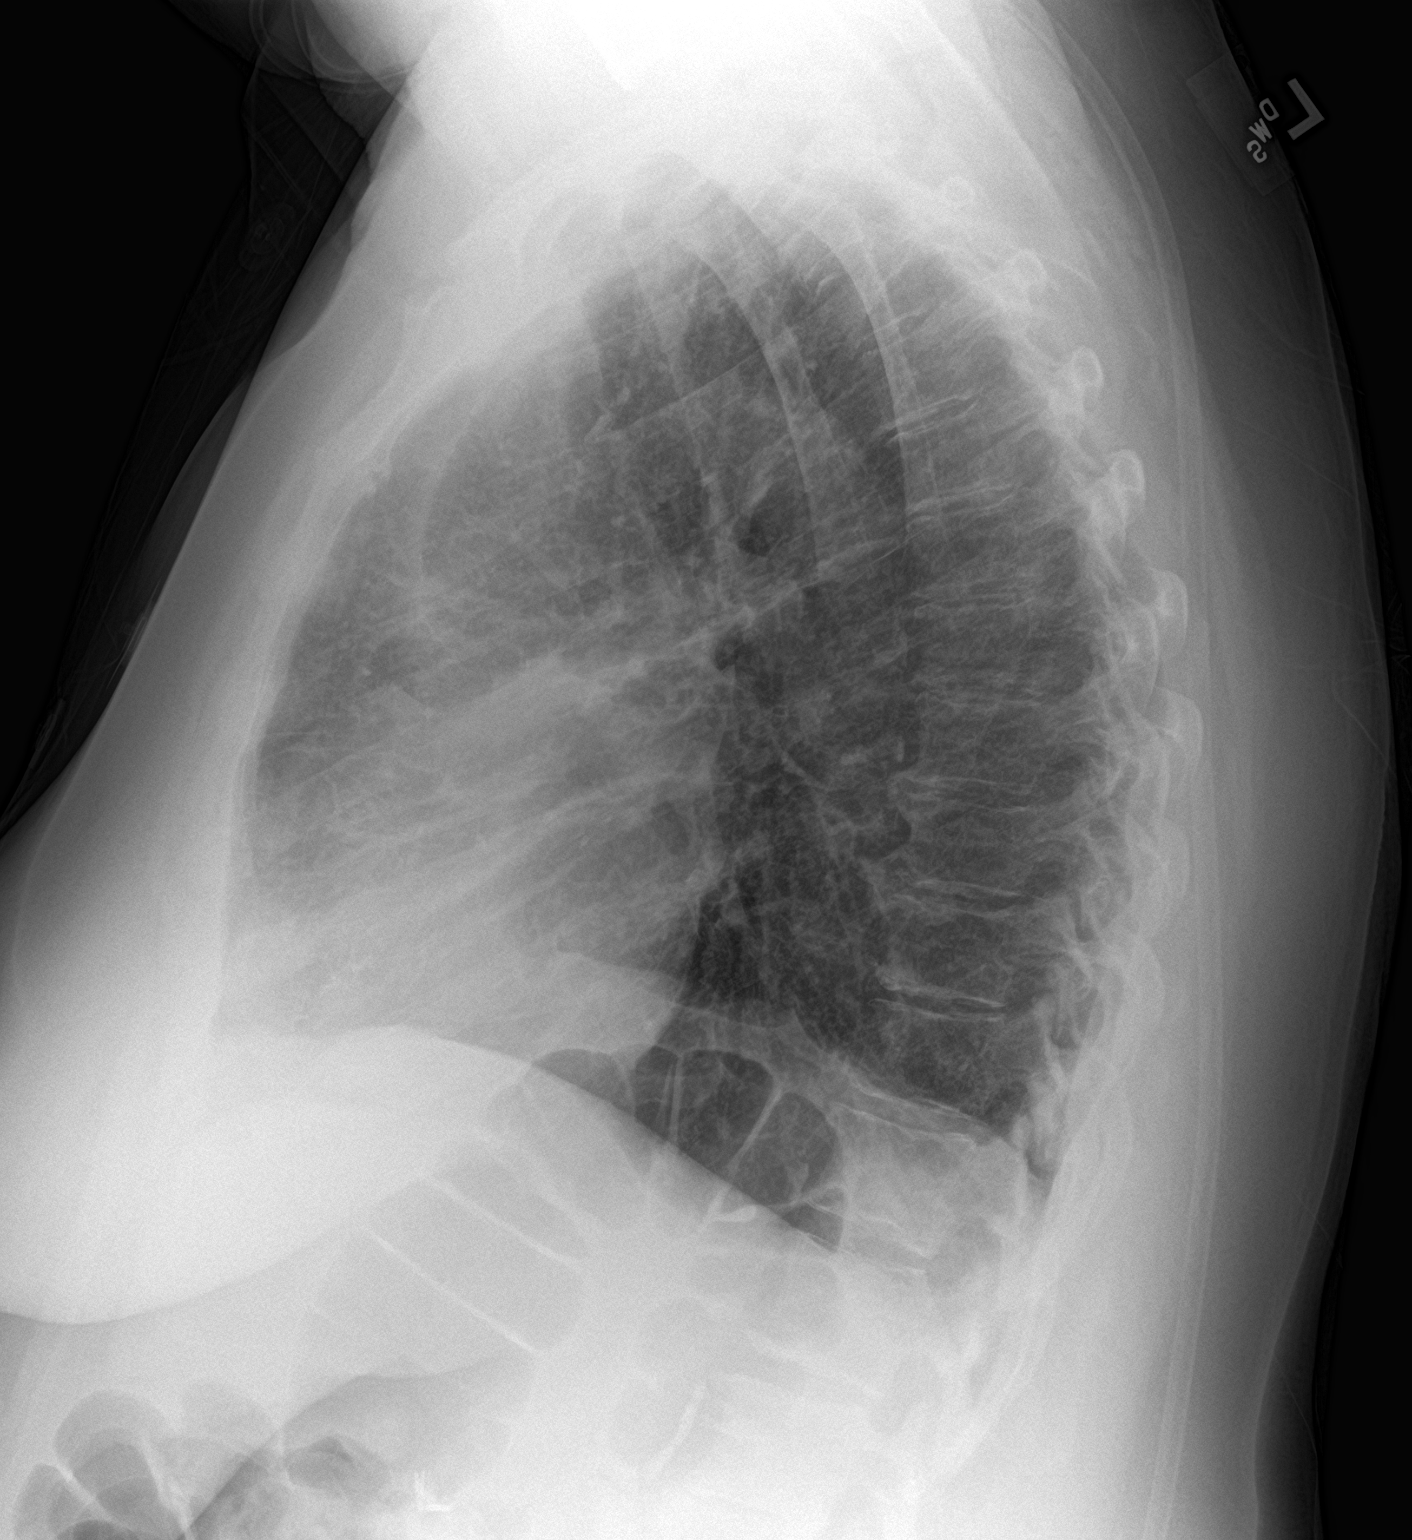

[2 of 2 positions shown; findings below may reference images not displayed]

FINDINGS: Dominant stable mild to moderate severity diffuse chronic appearing
increased interstitial lung markings are seen. Mild, stable areas of
atelectasis and/or scarring are seen within the mid left lung and
left lung base. There is no evidence of a pleural effusion or
pneumothorax. The heart size and mediastinal contours are within
normal limits. A radiopaque fusion plate and screws are seen
overlying the lower cervical spine. Degenerative changes are noted
throughout the thoracic spine.
IMPRESSION: Chronic appearing increased interstitial lung markings with mild,
stable mid left lung and left basilar scarring and/or atelectasis.
